# Patient Record
Sex: Female | Born: 1935
Health system: Southern US, Community
[De-identification: ages and names within clinical notes are randomized; demographics above are authoritative.]

## PROBLEM LIST (undated history)

## (undated) DIAGNOSIS — Z90710 Acquired absence of both cervix and uterus: Secondary | ICD-10-CM

## (undated) DIAGNOSIS — C50919 Malignant neoplasm of unspecified site of unspecified female breast: Secondary | ICD-10-CM

## (undated) DIAGNOSIS — Z9889 Other specified postprocedural states: Secondary | ICD-10-CM

## (undated) DIAGNOSIS — M1990 Unspecified osteoarthritis, unspecified site: Secondary | ICD-10-CM

## (undated) DIAGNOSIS — C50912 Malignant neoplasm of unspecified site of left female breast: Secondary | ICD-10-CM

## (undated) DIAGNOSIS — T7840XA Allergy, unspecified, initial encounter: Secondary | ICD-10-CM

## (undated) DIAGNOSIS — K219 Gastro-esophageal reflux disease without esophagitis: Secondary | ICD-10-CM

## (undated) HISTORY — DX: Unspecified osteoarthritis, unspecified site: M19.90

## (undated) HISTORY — DX: Gastro-esophageal reflux disease without esophagitis: K21.9

## (undated) HISTORY — PX: TOTAL SHOULDER REPLACEMENT: SUR1217

## (undated) HISTORY — PX: JOINT REPLACEMENT: SHX530

## (undated) HISTORY — DX: Malignant neoplasm of unspecified site of left female breast: C50.912

## (undated) HISTORY — PX: ABDOMINAL HYSTERECTOMY: SHX81

## (undated) HISTORY — PX: LYMPH NODE DISSECTION: SHX5087

## (undated) HISTORY — DX: Allergy, unspecified, initial encounter: T78.40XA

---

## 2003-05-06 ENCOUNTER — Other Ambulatory Visit: Payer: Self-pay

## 2004-09-15 ENCOUNTER — Ambulatory Visit: Payer: Self-pay

## 2004-10-07 ENCOUNTER — Ambulatory Visit: Payer: Self-pay | Admitting: Family Medicine

## 2004-10-13 ENCOUNTER — Ambulatory Visit: Payer: Self-pay | Admitting: Unknown Physician Specialty

## 2004-10-25 ENCOUNTER — Ambulatory Visit: Payer: Self-pay | Admitting: Unknown Physician Specialty

## 2005-10-25 ENCOUNTER — Ambulatory Visit: Payer: Self-pay | Admitting: Family Medicine

## 2006-10-31 ENCOUNTER — Ambulatory Visit: Payer: Self-pay | Admitting: Family Medicine

## 2007-02-07 ENCOUNTER — Ambulatory Visit: Payer: Self-pay | Admitting: Unknown Physician Specialty

## 2007-08-15 ENCOUNTER — Ambulatory Visit: Payer: Self-pay | Admitting: Family Medicine

## 2007-09-07 ENCOUNTER — Ambulatory Visit: Payer: Self-pay | Admitting: Unknown Physician Specialty

## 2007-11-06 ENCOUNTER — Ambulatory Visit: Payer: Self-pay | Admitting: Family Medicine

## 2009-03-02 ENCOUNTER — Ambulatory Visit: Payer: Self-pay | Admitting: Family Medicine

## 2009-07-30 ENCOUNTER — Ambulatory Visit: Payer: Self-pay | Admitting: Family Medicine

## 2011-12-20 DIAGNOSIS — R3129 Other microscopic hematuria: Secondary | ICD-10-CM | POA: Insufficient documentation

## 2012-04-18 DIAGNOSIS — Z9889 Other specified postprocedural states: Secondary | ICD-10-CM

## 2012-04-18 DIAGNOSIS — C50919 Malignant neoplasm of unspecified site of unspecified female breast: Secondary | ICD-10-CM

## 2012-04-18 HISTORY — DX: Malignant neoplasm of unspecified site of unspecified female breast: C50.919

## 2012-04-18 HISTORY — DX: Other specified postprocedural states: Z98.890

## 2012-04-18 HISTORY — PX: BREAST LUMPECTOMY: SHX2

## 2012-04-18 HISTORY — PX: BREAST SURGERY: SHX581

## 2012-04-19 ENCOUNTER — Ambulatory Visit: Payer: Self-pay | Admitting: Family Medicine

## 2012-05-19 ENCOUNTER — Ambulatory Visit: Payer: Self-pay | Admitting: Family Medicine

## 2012-09-17 ENCOUNTER — Ambulatory Visit: Payer: Self-pay | Admitting: Family Medicine

## 2012-09-18 ENCOUNTER — Ambulatory Visit: Payer: Self-pay | Admitting: Family Medicine

## 2012-10-09 ENCOUNTER — Ambulatory Visit: Payer: Self-pay | Admitting: Surgery

## 2012-10-11 ENCOUNTER — Ambulatory Visit: Payer: Self-pay | Admitting: Anesthesiology

## 2012-10-11 LAB — POTASSIUM: Potassium: 3.7 mmol/L (ref 3.5–5.1)

## 2012-11-05 ENCOUNTER — Ambulatory Visit: Payer: Self-pay | Admitting: Surgery

## 2012-11-08 LAB — PATHOLOGY REPORT

## 2012-11-13 ENCOUNTER — Ambulatory Visit: Payer: Self-pay | Admitting: Oncology

## 2012-11-15 ENCOUNTER — Ambulatory Visit: Payer: Self-pay | Admitting: Surgery

## 2012-11-15 LAB — BASIC METABOLIC PANEL
BUN: 13 mg/dL (ref 7–18)
Co2: 28 mmol/L (ref 21–32)
Creatinine: 0.79 mg/dL (ref 0.60–1.30)
EGFR (African American): >60
Osmolality: 265 (ref 275–301)
Potassium: 3.2 mmol/L — ABNORMAL LOW (ref 3.5–5.1)
Sodium: 132 mmol/L — ABNORMAL LOW (ref 136–145)

## 2012-11-15 LAB — CBC WITH DIFFERENTIAL/PLATELET
Basophil #: 0.1 10*3/uL (ref 0.0–0.1)
Basophil %: 1 %
Eosinophil #: 0.2 10*3/uL (ref 0.0–0.7)
Eosinophil %: 2.3 %
Lymphocyte #: 2.7 10*3/uL (ref 1.0–3.6)
Lymphocyte %: 28.9 %
MCHC: 35 g/dL (ref 32.0–36.0)
MCV: 87 fL (ref 80–100)
Neutrophil #: 5.6 10*3/uL (ref 1.4–6.5)
Neutrophil %: 60 %
Platelet: 266 10*3/uL (ref 150–440)
RBC: 4.45 10*6/uL (ref 3.80–5.20)
WBC: 9.3 10*3/uL (ref 3.6–11.0)

## 2012-11-16 ENCOUNTER — Ambulatory Visit: Payer: Self-pay | Admitting: Oncology

## 2012-11-20 ENCOUNTER — Ambulatory Visit: Payer: Self-pay | Admitting: Surgery

## 2012-11-22 LAB — PATHOLOGY REPORT

## 2012-12-17 ENCOUNTER — Ambulatory Visit: Payer: Self-pay | Admitting: Oncology

## 2012-12-31 LAB — CBC CANCER CENTER
Basophil #: 0.1 x10 3/mm (ref 0.0–0.1)
Eosinophil #: 0.2 x10 3/mm (ref 0.0–0.7)
HCT: 41.7 % (ref 35.0–47.0)
HGB: 14.2 g/dL (ref 12.0–16.0)
Lymphocyte #: 2.5 x10 3/mm (ref 1.0–3.6)
MCHC: 33.9 g/dL (ref 32.0–36.0)
MCV: 88 fL (ref 80–100)
Monocyte #: 0.7 x10 3/mm (ref 0.2–0.9)
Neutrophil #: 5.7 x10 3/mm (ref 1.4–6.5)
Neutrophil %: 61.9 %
Platelet: 294 x10 3/mm (ref 150–440)
RDW: 14.1 % (ref 11.5–14.5)
WBC: 9.2 x10 3/mm (ref 3.6–11.0)

## 2013-01-07 LAB — CBC CANCER CENTER
Basophil #: 0.1 x10 3/mm (ref 0.0–0.1)
Basophil %: 0.6 %
HCT: 39.9 % (ref 35.0–47.0)
HGB: 13.8 g/dL (ref 12.0–16.0)
Lymphocyte #: 1.9 x10 3/mm (ref 1.0–3.6)
Lymphocyte %: 21.4 %
MCHC: 34.5 g/dL (ref 32.0–36.0)
MCV: 88 fL (ref 80–100)
Monocyte %: 8.2 %
Neutrophil #: 6.2 x10 3/mm (ref 1.4–6.5)
WBC: 9.1 x10 3/mm (ref 3.6–11.0)

## 2013-01-14 LAB — CBC CANCER CENTER
Basophil #: 0.1 x10 3/mm (ref 0.0–0.1)
Basophil %: 0.9 %
Eosinophil #: 0.2 x10 3/mm (ref 0.0–0.7)
HCT: 39.8 % (ref 35.0–47.0)
HGB: 13.7 g/dL (ref 12.0–16.0)
Lymphocyte %: 22.8 %
MCHC: 34.5 g/dL (ref 32.0–36.0)
MCV: 88 fL (ref 80–100)
Monocyte #: 0.7 x10 3/mm (ref 0.2–0.9)
Monocyte %: 7.5 %
Neutrophil %: 66.1 %
Platelet: 252 x10 3/mm (ref 150–440)
RBC: 4.53 10*6/uL (ref 3.80–5.20)

## 2013-01-16 ENCOUNTER — Ambulatory Visit: Payer: Self-pay | Admitting: Oncology

## 2013-01-21 LAB — CBC CANCER CENTER
Basophil %: 0.9 %
HGB: 13.6 g/dL (ref 12.0–16.0)
Lymphocyte #: 1.2 x10 3/mm (ref 1.0–3.6)
MCH: 30.5 pg (ref 26.0–34.0)
MCHC: 34.1 g/dL (ref 32.0–36.0)
MCV: 90 fL (ref 80–100)
Monocyte %: 7.9 %
Neutrophil #: 4.9 x10 3/mm (ref 1.4–6.5)
RBC: 4.45 10*6/uL (ref 3.80–5.20)
RDW: 14.5 % (ref 11.5–14.5)
WBC: 7 x10 3/mm (ref 3.6–11.0)

## 2013-01-28 LAB — CBC CANCER CENTER
Basophil #: 0.1 x10 3/mm (ref 0.0–0.1)
Eosinophil #: 0.2 x10 3/mm (ref 0.0–0.7)
HGB: 13.6 g/dL (ref 12.0–16.0)
MCH: 30.9 pg (ref 26.0–34.0)
MCHC: 34.8 g/dL (ref 32.0–36.0)
Monocyte #: 0.6 x10 3/mm (ref 0.2–0.9)
Neutrophil #: 5.2 x10 3/mm (ref 1.4–6.5)
RBC: 4.39 10*6/uL (ref 3.80–5.20)
WBC: 7.4 x10 3/mm (ref 3.6–11.0)

## 2013-02-16 ENCOUNTER — Ambulatory Visit: Payer: Self-pay | Admitting: Oncology

## 2013-03-13 LAB — COMPREHENSIVE METABOLIC PANEL
Albumin: 3.4 g/dL (ref 3.4–5.0)
Bilirubin,Total: 0.3 mg/dL (ref 0.2–1.0)
Calcium, Total: 9.1 mg/dL (ref 8.5–10.1)
Chloride: 99 mmol/L (ref 98–107)
Co2: 29 mmol/L (ref 21–32)
Creatinine: 1.22 mg/dL (ref 0.60–1.30)
EGFR (Non-African Amer.): 43 — ABNORMAL LOW
Glucose: 172 mg/dL — ABNORMAL HIGH (ref 65–99)
Osmolality: 284 (ref 275–301)
SGOT(AST): 15 U/L (ref 15–37)
SGPT (ALT): 27 U/L (ref 12–78)
Sodium: 139 mmol/L (ref 136–145)
Total Protein: 6.8 g/dL (ref 6.4–8.2)

## 2013-03-13 LAB — CBC CANCER CENTER
Basophil #: 0.1 x10 3/mm (ref 0.0–0.1)
Basophil %: 1.2 %
HCT: 40.8 % (ref 35.0–47.0)
Lymphocyte #: 1.6 x10 3/mm (ref 1.0–3.6)
Lymphocyte %: 19.8 %
MCH: 29.7 pg (ref 26.0–34.0)
MCHC: 33 g/dL (ref 32.0–36.0)
Monocyte %: 5.7 %
Neutrophil #: 5.6 x10 3/mm (ref 1.4–6.5)
Neutrophil %: 71.2 %
RBC: 4.52 10*6/uL (ref 3.80–5.20)
RDW: 13.8 % (ref 11.5–14.5)
WBC: 7.9 x10 3/mm (ref 3.6–11.0)

## 2013-03-18 ENCOUNTER — Ambulatory Visit: Payer: Self-pay | Admitting: Oncology

## 2013-07-17 ENCOUNTER — Ambulatory Visit: Payer: Self-pay | Admitting: Oncology

## 2013-08-16 ENCOUNTER — Ambulatory Visit: Payer: Self-pay | Admitting: Oncology

## 2013-09-16 ENCOUNTER — Ambulatory Visit: Payer: Self-pay | Admitting: Oncology

## 2013-09-18 DIAGNOSIS — J45909 Unspecified asthma, uncomplicated: Secondary | ICD-10-CM | POA: Insufficient documentation

## 2013-09-19 ENCOUNTER — Ambulatory Visit: Payer: Self-pay | Admitting: Oncology

## 2013-10-10 ENCOUNTER — Ambulatory Visit: Payer: Self-pay | Admitting: Oncology

## 2013-10-10 LAB — CBC CANCER CENTER
Basophil #: 0.1 x10 3/mm (ref 0.0–0.1)
Basophil %: 0.9 %
EOS ABS: 0.1 x10 3/mm (ref 0.0–0.7)
EOS PCT: 1.8 %
HCT: 39.7 % (ref 35.0–47.0)
HGB: 13.7 g/dL (ref 12.0–16.0)
LYMPHS ABS: 1.3 x10 3/mm (ref 1.0–3.6)
LYMPHS PCT: 18.4 %
MCH: 30.1 pg (ref 26.0–34.0)
MCHC: 34.4 g/dL (ref 32.0–36.0)
MCV: 87 fL (ref 80–100)
MONOS PCT: 8 %
Monocyte #: 0.6 x10 3/mm (ref 0.2–0.9)
NEUTROS PCT: 70.9 %
Neutrophil #: 4.9 x10 3/mm (ref 1.4–6.5)
Platelet: 258 x10 3/mm (ref 150–440)
RBC: 4.55 10*6/uL (ref 3.80–5.20)
RDW: 14.1 % (ref 11.5–14.5)
WBC: 6.9 x10 3/mm (ref 3.6–11.0)

## 2013-10-10 LAB — COMPREHENSIVE METABOLIC PANEL
ALT: 32 U/L (ref 12–78)
Albumin: 3.9 g/dL (ref 3.4–5.0)
Alkaline Phosphatase: 72 U/L
Anion Gap: 11 (ref 7–16)
BILIRUBIN TOTAL: 0.4 mg/dL (ref 0.2–1.0)
BUN: 15 mg/dL (ref 7–18)
CHLORIDE: 100 mmol/L (ref 98–107)
Calcium, Total: 9.2 mg/dL (ref 8.5–10.1)
Co2: 28 mmol/L (ref 21–32)
Creatinine: 0.92 mg/dL (ref 0.60–1.30)
EGFR (Non-African Amer.): >60
Glucose: 98 mg/dL (ref 65–99)
Osmolality: 278 (ref 275–301)
Potassium: 3.5 mmol/L (ref 3.5–5.1)
SGOT(AST): 19 U/L (ref 15–37)
Sodium: 139 mmol/L (ref 136–145)
Total Protein: 7.1 g/dL (ref 6.4–8.2)

## 2013-10-16 ENCOUNTER — Ambulatory Visit: Payer: Self-pay | Admitting: Oncology

## 2014-02-19 ENCOUNTER — Ambulatory Visit: Payer: Self-pay | Admitting: Oncology

## 2014-03-18 ENCOUNTER — Ambulatory Visit: Payer: Self-pay | Admitting: Oncology

## 2014-04-22 DIAGNOSIS — J449 Chronic obstructive pulmonary disease, unspecified: Secondary | ICD-10-CM | POA: Diagnosis not present

## 2014-04-28 ENCOUNTER — Ambulatory Visit: Payer: Self-pay | Admitting: Oncology

## 2014-04-28 DIAGNOSIS — Z170 Estrogen receptor positive status [ER+]: Secondary | ICD-10-CM | POA: Diagnosis not present

## 2014-04-28 DIAGNOSIS — Z79811 Long term (current) use of aromatase inhibitors: Secondary | ICD-10-CM | POA: Diagnosis not present

## 2014-04-28 DIAGNOSIS — Z79899 Other long term (current) drug therapy: Secondary | ICD-10-CM | POA: Diagnosis not present

## 2014-04-28 DIAGNOSIS — I10 Essential (primary) hypertension: Secondary | ICD-10-CM | POA: Diagnosis not present

## 2014-04-28 DIAGNOSIS — E785 Hyperlipidemia, unspecified: Secondary | ICD-10-CM | POA: Diagnosis not present

## 2014-04-28 DIAGNOSIS — K219 Gastro-esophageal reflux disease without esophagitis: Secondary | ICD-10-CM | POA: Diagnosis not present

## 2014-04-28 DIAGNOSIS — J449 Chronic obstructive pulmonary disease, unspecified: Secondary | ICD-10-CM | POA: Diagnosis not present

## 2014-04-28 DIAGNOSIS — Z923 Personal history of irradiation: Secondary | ICD-10-CM | POA: Diagnosis not present

## 2014-04-28 DIAGNOSIS — C50912 Malignant neoplasm of unspecified site of left female breast: Secondary | ICD-10-CM | POA: Diagnosis not present

## 2014-04-28 LAB — COMPREHENSIVE METABOLIC PANEL
ALT: 24 U/L
AST: 14 U/L — AB (ref 15–37)
Albumin: 3.8 g/dL (ref 3.4–5.0)
Alkaline Phosphatase: 97 U/L
Anion Gap: 6 — ABNORMAL LOW (ref 7–16)
BUN: 13 mg/dL (ref 7–18)
Bilirubin,Total: 0.4 mg/dL (ref 0.2–1.0)
CHLORIDE: 102 mmol/L (ref 98–107)
Calcium, Total: 8.9 mg/dL (ref 8.5–10.1)
Co2: 31 mmol/L (ref 21–32)
Creatinine: 0.83 mg/dL (ref 0.60–1.30)
GLUCOSE: 95 mg/dL (ref 65–99)
Osmolality: 277 (ref 275–301)
Potassium: 4.1 mmol/L (ref 3.5–5.1)
SODIUM: 139 mmol/L (ref 136–145)
Total Protein: 7 g/dL (ref 6.4–8.2)

## 2014-04-28 LAB — CBC CANCER CENTER
Basophil #: 0.1 x10 3/mm (ref 0.0–0.1)
Basophil %: 1.2 %
EOS PCT: 2.1 %
Eosinophil #: 0.2 x10 3/mm (ref 0.0–0.7)
HCT: 40.8 % (ref 35.0–47.0)
HGB: 13.8 g/dL (ref 12.0–16.0)
Lymphocyte #: 1.6 x10 3/mm (ref 1.0–3.6)
Lymphocyte %: 21.2 %
MCH: 29.8 pg (ref 26.0–34.0)
MCHC: 33.8 g/dL (ref 32.0–36.0)
MCV: 88 fL (ref 80–100)
MONO ABS: 0.6 x10 3/mm (ref 0.2–0.9)
MONOS PCT: 8.3 %
NEUTROS PCT: 67.2 %
Neutrophil #: 5 x10 3/mm (ref 1.4–6.5)
PLATELETS: 264 x10 3/mm (ref 150–440)
RBC: 4.64 10*6/uL (ref 3.80–5.20)
RDW: 13.7 % (ref 11.5–14.5)
WBC: 7.5 x10 3/mm (ref 3.6–11.0)

## 2014-05-19 ENCOUNTER — Ambulatory Visit: Payer: Self-pay | Admitting: Oncology

## 2014-05-19 DIAGNOSIS — J449 Chronic obstructive pulmonary disease, unspecified: Secondary | ICD-10-CM | POA: Diagnosis not present

## 2014-05-19 DIAGNOSIS — R7309 Other abnormal glucose: Secondary | ICD-10-CM | POA: Diagnosis not present

## 2014-05-19 DIAGNOSIS — R5382 Chronic fatigue, unspecified: Secondary | ICD-10-CM | POA: Diagnosis not present

## 2014-05-19 DIAGNOSIS — E876 Hypokalemia: Secondary | ICD-10-CM | POA: Diagnosis not present

## 2014-05-19 DIAGNOSIS — I10 Essential (primary) hypertension: Secondary | ICD-10-CM | POA: Diagnosis not present

## 2014-05-23 DIAGNOSIS — J449 Chronic obstructive pulmonary disease, unspecified: Secondary | ICD-10-CM | POA: Diagnosis not present

## 2014-06-20 DIAGNOSIS — M19012 Primary osteoarthritis, left shoulder: Secondary | ICD-10-CM | POA: Diagnosis not present

## 2014-06-20 DIAGNOSIS — M19011 Primary osteoarthritis, right shoulder: Secondary | ICD-10-CM | POA: Diagnosis not present

## 2014-06-21 DIAGNOSIS — J449 Chronic obstructive pulmonary disease, unspecified: Secondary | ICD-10-CM | POA: Diagnosis not present

## 2014-07-01 ENCOUNTER — Ambulatory Visit: Payer: Self-pay

## 2014-07-01 DIAGNOSIS — M179 Osteoarthritis of knee, unspecified: Secondary | ICD-10-CM | POA: Diagnosis not present

## 2014-07-01 DIAGNOSIS — M1712 Unilateral primary osteoarthritis, left knee: Secondary | ICD-10-CM | POA: Diagnosis not present

## 2014-07-01 DIAGNOSIS — M47896 Other spondylosis, lumbar region: Secondary | ICD-10-CM | POA: Diagnosis not present

## 2014-07-01 DIAGNOSIS — M25569 Pain in unspecified knee: Secondary | ICD-10-CM | POA: Diagnosis not present

## 2014-07-01 DIAGNOSIS — F329 Major depressive disorder, single episode, unspecified: Secondary | ICD-10-CM | POA: Diagnosis not present

## 2014-07-01 DIAGNOSIS — M545 Low back pain: Secondary | ICD-10-CM | POA: Diagnosis not present

## 2014-07-01 DIAGNOSIS — M1711 Unilateral primary osteoarthritis, right knee: Secondary | ICD-10-CM | POA: Diagnosis not present

## 2014-07-01 DIAGNOSIS — G8929 Other chronic pain: Secondary | ICD-10-CM | POA: Diagnosis not present

## 2014-07-01 DIAGNOSIS — Z79899 Other long term (current) drug therapy: Secondary | ICD-10-CM | POA: Diagnosis not present

## 2014-07-08 DIAGNOSIS — J302 Other seasonal allergic rhinitis: Secondary | ICD-10-CM | POA: Diagnosis not present

## 2014-07-08 DIAGNOSIS — J449 Chronic obstructive pulmonary disease, unspecified: Secondary | ICD-10-CM | POA: Diagnosis not present

## 2014-07-08 DIAGNOSIS — M1990 Unspecified osteoarthritis, unspecified site: Secondary | ICD-10-CM | POA: Diagnosis not present

## 2014-07-08 DIAGNOSIS — K219 Gastro-esophageal reflux disease without esophagitis: Secondary | ICD-10-CM | POA: Diagnosis not present

## 2014-07-08 DIAGNOSIS — I10 Essential (primary) hypertension: Secondary | ICD-10-CM | POA: Diagnosis not present

## 2014-07-22 DIAGNOSIS — J449 Chronic obstructive pulmonary disease, unspecified: Secondary | ICD-10-CM | POA: Diagnosis not present

## 2014-07-23 DIAGNOSIS — H2513 Age-related nuclear cataract, bilateral: Secondary | ICD-10-CM | POA: Diagnosis not present

## 2014-07-25 DIAGNOSIS — J449 Chronic obstructive pulmonary disease, unspecified: Secondary | ICD-10-CM | POA: Diagnosis not present

## 2014-07-25 DIAGNOSIS — R911 Solitary pulmonary nodule: Secondary | ICD-10-CM | POA: Diagnosis not present

## 2014-08-01 ENCOUNTER — Other Ambulatory Visit: Payer: Self-pay | Admitting: Oncology

## 2014-08-01 DIAGNOSIS — Z79891 Long term (current) use of opiate analgesic: Secondary | ICD-10-CM

## 2014-08-01 DIAGNOSIS — M810 Age-related osteoporosis without current pathological fracture: Secondary | ICD-10-CM

## 2014-08-01 DIAGNOSIS — Z853 Personal history of malignant neoplasm of breast: Secondary | ICD-10-CM

## 2014-08-06 DIAGNOSIS — G8929 Other chronic pain: Secondary | ICD-10-CM | POA: Diagnosis not present

## 2014-08-06 DIAGNOSIS — M25569 Pain in unspecified knee: Secondary | ICD-10-CM | POA: Diagnosis not present

## 2014-08-06 DIAGNOSIS — Z79891 Long term (current) use of opiate analgesic: Secondary | ICD-10-CM | POA: Diagnosis not present

## 2014-08-06 DIAGNOSIS — M545 Low back pain: Secondary | ICD-10-CM | POA: Diagnosis not present

## 2014-08-06 DIAGNOSIS — F1120 Opioid dependence, uncomplicated: Secondary | ICD-10-CM | POA: Diagnosis not present

## 2014-08-06 DIAGNOSIS — M25559 Pain in unspecified hip: Secondary | ICD-10-CM | POA: Diagnosis not present

## 2014-08-08 NOTE — Op Note (Signed)
PATIENT NAME:  Candace Tapia, GAHM MR#:  [id number] DATE OF BIRTH:  November 08, 1935  DATE OF PROCEDURE:  11/20/2012  PREOPERATIVE DIAGNOSIS: Left breast carcinoma.   POSTOPERATIVE DIAGNOSIS: Left breast carcinoma.   PROCEDURE PERFORMED: Left partial mastectomy, sentinel lymph node biopsy.   SURGEON: Rodena Goldmann, MD  ASSISTANTGardiner Sleeper, [place] student; Luis Abed, [place] student.   DESCRIPTION OF PROCEDURE: With the patient in the supine position after induction of appropriate general anesthesia, the patient's left breast was prepped with ChloraPrep and draped with sterile towels. The axilla had been previously interrogated with the Neoprobe and no significant radioactivity was identified in the axilla. Reviewing preoperatively the nuclear medicine injection, there was no sentinel node identified on the scintography. The axilla was then incised and the incision carried down through the subcutaneous tissue using Bovie electrocautery. The axillary area was entered into the axillary fat pad. The Neoprobe was utilized and there were several areas of minimal radioactivity which did produce 2 large lymph nodes. They were sent for immediate evaluation and were found to contain macrometastasis in 1 of the 2 lymph nodes. Another fat pad was sent with the plan of reviewing it with permanent dissection. The area identified by the wire for the breast cancer was ellipsed and extended to the incision in the axilla in a transverse fashion. This incision was carried down through the subcutaneous tissue with Bovie electrocautery. Dissection was taken straight down to the chest wall and off the pectoralis muscle. The specimen was tagged and sent for specimen mammography which did reveal the presence of the needle and the previous clip. The specimen did appear to be intact. The area was copiously irrigated. A 10 mm flat Jackson-Pratt drain was inserted through a separate stab wound into the resection site and placed across the  pectoralis muscle. The skin was then closed using vertical mattress sutures in interrupted fashion. Compressive dressing was applied. The patient was returned to the recovery room having tolerated the procedure well. Sponge, instrument and needle counts were correct x 2 in the operating room.  ____________________________ Rodena Goldmann III, MD rle:sb D: 11/20/2012 14:32:29 ET T: 11/20/2012 15:44:19 ET JOB#: [id number]  cc: Rodena Goldmann III, MD, <Dictator> Raye Sorrow, MD Rodena Goldmann MD ELECTRONICALLY SIGNED 11/20/2012 17:00

## 2014-08-08 NOTE — Consult Note (Signed)
Reason for Visit: This 79 year old Female patient presents to the clinic for initial evaluation of  breast cancer .   Referred by Dr. Oliva Bustard.  Diagnosis:  Chief Complaint/Diagnosis   79 year old female status post wide local excision of the left breast for a stage I (T1 B. N0 M0) invasive mammary carcinoma ER positive PR borderline HER-2/neu not overexpressed  Pathology Report pathology report reviewed   Imaging Report mammograms ultrasound reviewed   Referral Report clinical notes reviewed   Planned Treatment Regimen whole breast radiation to her scar boost plus aromatase inhibitor   HPI   patient is a 79 year old femalewho presented with a BI-RADS for mammogram showing a irregular marginated structure in the deep parenchyma of the left breast at the 12:00 position with associated microcalcifications. Underwent needle localization positive for invasive mammary carcinoma.patient and I had regular routine mammograms. She underwent a wide local excisionshowing a 0.7 cm invasive mammary carcinoma with infiltrative features ER was positive PR borderline HER-2/neu not overexpressed. 2 sentinel lymph nodes were negative for metastatic disease. Tumor was overall grade 2 you lymphatic invasion was seen. Margins were clearat 1 cm for invasive disease and point for CM for ductal carcinoma in situ. Patient has done well postoperatively. She is now referred to radiation oncology for consideration of treatment.  Past Hx:    hyperlipidemia:    arthritis:    acid reflux:    Depression:    Depression:    COPD:    HTN:    back pain:    seasonal allergies:    Breast lump in outer upper quadrant:    hysterectomy (1970):    Breast Biopsy-Both (1968); Left 1968; Right 1970 (benign):    Shoulder Surgery-BL:   Past, Family and Social History:  Past Medical History positive   Cardiovascular hyperlipidemia; hypertension   Respiratory COPD; seasonal rhinitis   Gastrointestinal GERD    Neurological/Psychiatric depression   Past Surgical History shoulder surgery, hysterectomy in 1970   Past Medical History Comments arthritis, chronic back pain   Family History positive   Family History Comments sister and mother both with breast cancer patient is being evaluated for genetic testing   Social History noncontributory   Additional Past Medical and Surgical History seen by herself today   Allergies:   Penicillin: Rash  Sulfa drugs: Rash  Home Meds:  Home Medications: Medication Instructions Status  Calcium 600+D 600 mg-200 units oral tablet 1 tab(s) orally 2 times a day Active  Femara 2.5 mg oral tablet 1 tab(s) orally once a day Active  acetaminophen-HYDROcodone 325 mg-5 mg oral tablet 1 tab(s) orally every 6 hours, As Needed Active  Spiriva 18 mcg inhalation capsule 1 each inhaled once a day (in the morning) Active  hydrochlorothiazide-lisinopril 12.5 mg-20 mg oral tablet 2 tab(s) orally once a day (in the morning) Active  sertraline 100 mg oral tablet 1 tab(s) orally once a day (in the morning) Active  amLODIPine 5 mg oral tablet 1 tab(s) orally once a day (in the morning) Active  omeprazole 20 mg oral delayed release capsule 1 cap(s) orally once a day (in the evening) Active  Vitamin B12 2500 mcg sublingual tablet 1 tab(s) sublingual once a day (in the morning) Active  Vitamin D3 1000 intl units oral tablet 1 tab(s) orally once a day (in the morning) Active   Review of Systems:  General negative   Performance Status (ECOG) 0   Skin negative   Breast see HPI   Ophthalmologic negative  ENMT negative   Respiratory and Thorax negative   Cardiovascular negative   Gastrointestinal negative   Genitourinary negative   Musculoskeletal negative   Neurological negative   Psychiatric negative   Hematology/Lymphatics negative   Endocrine negative   Allergic/Immunologic negative   Nursing Notes:  Nursing Vital Signs and Chemo Nursing Nursing  Notes: *CC Vital Signs Flowsheet:   21-Aug-14 13:44  Temp Temperature 97.7  Pulse Pulse 96  SBP SBP 166  DBP DBP 90  Pain Scale (0-10)  0  Current Weight (kg) (kg) 94.9   Physical Exam:  General/Skin/HEENT:  General normal   Skin normal   Eyes normal   ENMT normal   Head and Neck normal   Additional PE well-developed slightly obese female in NAD. She status post wide local excision of the left breast with a single incision horizontal including her axillary site. Single incision extends into the left axilla. Incision is healing well. No dominant mass or nodularity is noted in either breast into position examined. Lungs are clear to A&P cardiac examination shows regular rate and rhythm. Abdomen is benign. No axillary or supraclavicular adenopathy is appreciated.   Breasts/Resp/CV/GI/GU:  Respiratory and Thorax normal   Cardiovascular normal   Gastrointestinal normal   Genitourinary normal   MS/Neuro/Psych/Lymph:  Musculoskeletal normal   Neurological normal   Lymphatics normal   Other Results:  Radiology Results: [place]:    03-Jun-14 15:40, US Breast Left  US Breast Left   REASON FOR EXAM:    av lt nodularity  COMMENTS:       PROCEDURE: [place]  - US BREAST LEFT  - Sep 18 2012  3:40PM     RESULT: Ultrasound directed to the upper aspect of the left breast at   approximately 12:00 position was performed. There is a hypoechoicto   anechoic structure demonstrated approximately 2 cm from the nipple it   measures 4 x 2 x 3 millimeters. Distal shadowing is not demonstrated.    IMPRESSION:  There is a hypoechoic to anechoic structure in the deep   parenchyma the 12:30 positionof the left breast. This is most compatible   with a cyst.     Please see the dictation of the diagnostic mammogram of today's date for     final recommendations and BI-RADS classification.        Dictation site:1        Verified By: Maryella Shivers., MD  LabUnknown:    03-Jun-14 14:17,  Digital Additional Views Lt Breast [facility])  PACS Image     03-Jun-14 15:40, US Breast Left  PACS [place]:    03-Jun-14 14:17, Digital Additional Views Lt Breast (SCR)  Digital Additional Views Lt Breast (SCR)   REASON FOR EXAM:    av lt nodularity  COMMENTS:       PROCEDURE: MAM - MAM DGTL ADD VW LT  SCR  - Sep 18 2012  2:17PM     RESULT: On the patient's screening study of September 17, 2012 nodularity was   noted be more conspicuous on the left. The patient wasasked to return   for spot compression and true lateral films today.    On today's study there remains subtle increased density in the midportion   of the left breast with 8 microcalcifications. Ultrasound performed today   did not reveal a discrete corresponding lesion. More superficial cystic   structure was demonstrated.    IMPRESSION:  There is a persistent abnormal appearing irregularly  marginated structure in the deep parenchyma of the left breast above the   equator at approximately the 12:00 position. Very faint   microcalcifications are demonstrated within it. This is demonstrated to   best advantage on the cc and true lateral views.    BI-RADS 4a: There is spiculated nodular density in the deep parenchyma   just above the equator of the left breast which contains very faint   microcalcifications. Surgical consultation is recommended. This could be   considered for wire localization and open biopsy or stereotactic biopsy.   A lesion here was not identified at ultrasound.        A NEGATIVE MAMMOGRAM REPORT DOES NOT PRECLUDE BIOPSY OR OTHER EVALUATION   OF A CLINICALLY PALPABLE OR OTHERWISE SUSPICIOUS MASS OR LESION. BREAST     CANCER MAY NOT BE DETECTED BY MAMMOGRAPHY IN UP TO 10% OF CASES.     Thank you for the opportunity to contribute to the care of your patient.      Dictation site:1        Verified By: DAVID A. Martinique, M.D., MD   Relevent Results:   Relevant Scans and Labs mammograms  and ultrasound are reviewed   Assessment and Plan: Impression:   stage I invasive mammary carcinoma of the left breast status post wide local excision and sentinel node biopsy ER positive PR status borderline HER-2/neu not overexpressed in 79 year old female Plan:   patient has been seen by medical oncology and I have discussed the case personally with Dr. Oliva Bustard. Patient will be a candidate for aromatase inhibitor after radiation. I have recommended going ahead with whole breast radiation at 5000 cGy boosting or scar another 1400 cGy using electron beam. Risks and benefits of treatment including irritation of the skin, fatigue, alteration of blood counts, and possible inclusion of slight amount of superficial lung were all explained in detail to the patient. She seems to comprehend my treatment plan well. I have set her up for CT simulation early next week to allow some further healing since his only been about 2 weeks since her surgery.  I would like to take this opportunity to thank you for allowing me to continue to participate in this patient's care.  CC Referral:  cc: Dr. Ellene Route, Dr. Pat Patrick   Electronic Signatures: Baruch Gouty, Roda Shutters (MD)  (Signed 21-Aug-14 15:13)  Authored: HPI, Diagnosis, Past Hx, PFSH, Allergies, Home Meds, ROS, Nursing Notes, Physical Exam, Other Results, Relevent Results, Encounter Assessment and Plan, CC Referring Physician   Last Updated: 21-Aug-14 15:13 by Armstead Peaks (MD)

## 2014-08-29 DIAGNOSIS — Z008 Encounter for other general examination: Secondary | ICD-10-CM | POA: Diagnosis not present

## 2014-08-29 DIAGNOSIS — R05 Cough: Secondary | ICD-10-CM | POA: Diagnosis not present

## 2014-08-29 DIAGNOSIS — Z23 Encounter for immunization: Secondary | ICD-10-CM | POA: Diagnosis not present

## 2014-09-01 ENCOUNTER — Other Ambulatory Visit: Payer: Self-pay | Admitting: Specialist

## 2014-09-01 DIAGNOSIS — R918 Other nonspecific abnormal finding of lung field: Secondary | ICD-10-CM

## 2014-09-08 ENCOUNTER — Ambulatory Visit
Admission: RE | Admit: 2014-09-08 | Discharge: 2014-09-08 | Disposition: A | Discharge status: Home / Self Care | Payer: Medicare Other | Source: Ambulatory Visit | Attending: Specialist | Admitting: Specialist

## 2014-09-08 DIAGNOSIS — I700 Atherosclerosis of aorta: Secondary | ICD-10-CM | POA: Insufficient documentation

## 2014-09-08 DIAGNOSIS — J439 Emphysema, unspecified: Secondary | ICD-10-CM | POA: Diagnosis not present

## 2014-09-08 DIAGNOSIS — R918 Other nonspecific abnormal finding of lung field: Secondary | ICD-10-CM | POA: Diagnosis not present

## 2014-09-08 DIAGNOSIS — Z853 Personal history of malignant neoplasm of breast: Secondary | ICD-10-CM | POA: Insufficient documentation

## 2014-09-09 DIAGNOSIS — M25559 Pain in unspecified hip: Secondary | ICD-10-CM | POA: Diagnosis not present

## 2014-09-09 DIAGNOSIS — M545 Low back pain: Secondary | ICD-10-CM | POA: Diagnosis not present

## 2014-09-09 DIAGNOSIS — M25569 Pain in unspecified knee: Secondary | ICD-10-CM | POA: Diagnosis not present

## 2014-09-09 DIAGNOSIS — G8929 Other chronic pain: Secondary | ICD-10-CM | POA: Diagnosis not present

## 2014-09-21 DIAGNOSIS — J449 Chronic obstructive pulmonary disease, unspecified: Secondary | ICD-10-CM | POA: Diagnosis not present

## 2014-09-22 ENCOUNTER — Ambulatory Visit: Payer: Self-pay

## 2014-09-22 ENCOUNTER — Ambulatory Visit: Admission: RE | Admit: 2014-09-22 | Payer: Medicare Other | Source: Ambulatory Visit

## 2014-09-22 ENCOUNTER — Ambulatory Visit
Admission: RE | Admit: 2014-09-22 | Discharge: 2014-09-22 | Disposition: A | Discharge status: Home / Self Care | Payer: Medicare Other | Source: Ambulatory Visit | Attending: Oncology | Admitting: Oncology

## 2014-09-22 DIAGNOSIS — R928 Other abnormal and inconclusive findings on diagnostic imaging of breast: Secondary | ICD-10-CM | POA: Diagnosis not present

## 2014-09-22 DIAGNOSIS — Z853 Personal history of malignant neoplasm of breast: Secondary | ICD-10-CM | POA: Diagnosis not present

## 2014-09-22 HISTORY — DX: Malignant neoplasm of unspecified site of unspecified female breast: C50.919

## 2014-09-30 ENCOUNTER — Ambulatory Visit
Admission: RE | Admit: 2014-09-30 | Discharge: 2014-09-30 | Disposition: A | Discharge status: Home / Self Care | Payer: Medicare Other | Source: Ambulatory Visit | Attending: Oncology | Admitting: Oncology

## 2014-09-30 DIAGNOSIS — Z79891 Long term (current) use of opiate analgesic: Secondary | ICD-10-CM | POA: Insufficient documentation

## 2014-09-30 DIAGNOSIS — Z780 Asymptomatic menopausal state: Secondary | ICD-10-CM | POA: Diagnosis not present

## 2014-09-30 DIAGNOSIS — M810 Age-related osteoporosis without current pathological fracture: Secondary | ICD-10-CM | POA: Insufficient documentation

## 2014-09-30 DIAGNOSIS — Z853 Personal history of malignant neoplasm of breast: Secondary | ICD-10-CM | POA: Insufficient documentation

## 2014-09-30 DIAGNOSIS — M8580 Other specified disorders of bone density and structure, unspecified site: Secondary | ICD-10-CM | POA: Insufficient documentation

## 2014-09-30 DIAGNOSIS — M8588 Other specified disorders of bone density and structure, other site: Secondary | ICD-10-CM | POA: Diagnosis not present

## 2014-10-21 DIAGNOSIS — J449 Chronic obstructive pulmonary disease, unspecified: Secondary | ICD-10-CM | POA: Diagnosis not present

## 2014-10-24 ENCOUNTER — Other Ambulatory Visit: Payer: Self-pay | Admitting: *Deleted

## 2014-10-24 DIAGNOSIS — C50919 Malignant neoplasm of unspecified site of unspecified female breast: Secondary | ICD-10-CM

## 2014-10-27 ENCOUNTER — Inpatient Hospital Stay: Payer: Medicare Other | Attending: Oncology | Admitting: Oncology

## 2014-10-27 ENCOUNTER — Inpatient Hospital Stay: Payer: Medicare Other

## 2014-10-27 ENCOUNTER — Encounter: Payer: Self-pay | Admitting: Oncology

## 2014-10-27 VITALS — BP 182/109 | HR 84 | Temp 96.6°F | Wt 217.6 lb

## 2014-10-27 DIAGNOSIS — C50919 Malignant neoplasm of unspecified site of unspecified female breast: Secondary | ICD-10-CM

## 2014-10-27 DIAGNOSIS — Z170 Estrogen receptor positive status [ER+]: Secondary | ICD-10-CM | POA: Insufficient documentation

## 2014-10-27 DIAGNOSIS — C50912 Malignant neoplasm of unspecified site of left female breast: Secondary | ICD-10-CM | POA: Diagnosis not present

## 2014-10-27 DIAGNOSIS — Z79811 Long term (current) use of aromatase inhibitors: Secondary | ICD-10-CM | POA: Diagnosis not present

## 2014-10-27 DIAGNOSIS — M8580 Other specified disorders of bone density and structure, unspecified site: Secondary | ICD-10-CM

## 2014-10-27 HISTORY — DX: Malignant neoplasm of unspecified site of left female breast: C50.912

## 2014-10-27 LAB — COMPREHENSIVE METABOLIC PANEL
ALBUMIN: 4.3 g/dL (ref 3.5–5.0)
ALT: 21 U/L (ref 14–54)
ANION GAP: 11 (ref 5–15)
AST: 21 U/L (ref 15–41)
Alkaline Phosphatase: 103 U/L (ref 38–126)
BILIRUBIN TOTAL: 0.6 mg/dL (ref 0.3–1.2)
BUN: 16 mg/dL (ref 6–20)
CO2: 26 mmol/L (ref 22–32)
Calcium: 9 mg/dL (ref 8.9–10.3)
Chloride: 101 mmol/L (ref 101–111)
Creatinine, Ser: 0.84 mg/dL (ref 0.44–1.00)
GFR calc Af Amer: >60 mL/min (ref >60)
GFR calc non Af Amer: >60 mL/min (ref >60)
GLUCOSE: 91 mg/dL (ref 65–99)
Potassium: 3.6 mmol/L (ref 3.5–5.1)
Sodium: 138 mmol/L (ref 135–145)
TOTAL PROTEIN: 7.6 g/dL (ref 6.5–8.1)

## 2014-10-27 LAB — CBC WITH DIFFERENTIAL/PLATELET
BASOS ABS: 0.1 10*3/uL (ref 0–0.1)
Basophils Relative: 1 %
Eosinophils Absolute: 0.1 10*3/uL (ref 0–0.7)
Eosinophils Relative: 2 %
HEMATOCRIT: 43 % (ref 35.0–47.0)
HEMOGLOBIN: 14.3 g/dL (ref 12.0–16.0)
LYMPHS ABS: 1.5 10*3/uL (ref 1.0–3.6)
Lymphocytes Relative: 19 %
MCH: 29.3 pg (ref 26.0–34.0)
MCHC: 33.3 g/dL (ref 32.0–36.0)
MCV: 88 fL (ref 80.0–100.0)
Monocytes Absolute: 0.6 10*3/uL (ref 0.2–0.9)
Monocytes Relative: 7 %
Neutro Abs: 5.8 10*3/uL (ref 1.4–6.5)
Neutrophils Relative %: 71 %
Platelets: 242 10*3/uL (ref 150–440)
RBC: 4.89 MIL/uL (ref 3.80–5.20)
RDW: 14.7 % — ABNORMAL HIGH (ref 11.5–14.5)
WBC: 8.1 10*3/uL (ref 3.6–11.0)

## 2014-10-27 NOTE — Progress Notes (Signed)
Patient does not have living will.  Declined information.  Former smoker. 

## 2014-10-27 NOTE — Progress Notes (Signed)
[place] @ [employer] [phone]  Fax:([phone]) [place]: April 18, 1936  MR#: [id number]  LKJ#:[id number]  Patient Care Team: Candace Tapia., MD as PCP - General (Family Medicine)  CHIEF COMPLAINT:  Chief Complaint  Patient presents with  . Follow-up    Oncology History   79 year old female status post wide local excision of the left breast for a stage I (T1 B. N0 M0) invasive mammary carcinoma ER positive PR borderline HER-2/neu not overexpressed She has  finished radiation therapy on September of 2014 She has declined any genetic study Started on Femara  in November of 2014 Candace Tapia was Discontinued because of side effect and may 2015. Started on Aromasin in May of  2015 HPI:        Cancer of left breast   10/27/2014 Initial Diagnosis Cancer of left breast     INTERVAL HISTORY:  79 year old lady with a history of carcinoma of breast (left) status post lumpectomy radiation therapy on Aromasin and tolerating treatment very well. Grade 1 and joint pains.  No nausea.  No vomiting.  No diarrhea. Asked mammogram was in June reported to be pyretic 2 REVIEW OF SYSTEMS:   GENERAL:  Feels good.  Active.  No fevers, sweats or weight loss. PERFORMANCE STATUS (ECOG):  0 HEENT:  No visual changes, runny nose, sore throat, mouth sores or tenderness. Lungs: No shortness of breath or cough.  No hemoptysis. Cardiac:  No chest pain, palpitations, orthopnea, or PND. GI:  No nausea, vomiting, diarrhea, constipation, melena or hematochezia. GU:  No urgency, frequency, dysuria, or hematuria. Musculoskeletal:  No back pain.  No joint pain.  No muscle tenderness. Extremities:  No pain or swelling. Skin:  No rashes or skin changes. Neuro:  No headache, numbness or weakness, balance or coordination issues. Endocrine:  No diabetes, thyroid issues, hot flashes or night sweats. Psych:  No mood changes, depression or anxiety. Pain:  No focal pain. Review of  systems:  All other systems reviewed and found to be negative. As per HPI. Otherwise, a complete review of systems is negatve.  PAST MEDICAL HISTORY: Past Medical History  Diagnosis Date  . Breast cancer 2014    radiation and taking exemestane  . Cancer of left breast 10/27/2014    PAST SURGICAL HISTORY: Past Surgical History  Procedure Laterality Date  . Breast biopsy Bilateral 1966    neg  . Breast biopsy Left 2014    positive    FAMILY HISTORY Family History  Problem Relation Age of Onset  . Breast cancer Mother 22  . Breast cancer Sister 30    ADVANCED DIRECTIVES:  Patient does not have any living will or healthcare power of attorney.  Patient declined to get any information .  Available resources had been discussed.  We will follow-up on subsequent appointments regarding this issue HEALTH MAINTENANCE: History  Substance Use Topics  . Smoking status: Not on file  . Smokeless tobacco: Not on file  . Alcohol Use: Not on file      Allergies  Allergen Reactions  . Penicillins Other (See Comments)  . Penicillin G Rash  . Sulfa Antibiotics Other (See Comments) and Rash    No current outpatient prescriptions on file.   No current facility-administered medications for this visit.    OBJECTIVE:  Filed Vitals:   10/27/14 1119  BP: 182/109  Pulse: 84  Temp: 96.6 F (35.9 C)     There is no height on file  to calculate BMI.    ECOG FS:0 - Asymptomatic  PHYSICAL EXAM: GENERAL:  Well developed, well nourished, sitting comfortably in the exam room in no acute distress. MENTAL STATUS:  Alert and oriented to person, place and time. HEAD:  Normocephalic, atraumatic, face symmetric, no Cushingoid features. EYES: Pupils equal round and reactive to light and accomodation.  No conjunctivitis or scleral icterus. ENT:  Oropharynx clear without lesion.  Tongue normal. Mucous membranes moist.  RESPIRATORY:  Clear to auscultation without rales, wheezes or  rhonchi. CARDIOVASCULAR:  Regular rate and rhythm without murmur, rub or gallop. BREAST:  Right breast without masses, skin changes or nipple discharge.  Left breast: Lumpectomy.  Scar tissue.  No palpable masses.  Radiation changes on the skin.  Exam rate nodes not palpable. ABDOMEN:  Soft, non-tender, with active bowel sounds, and no hepatosplenomegaly.  No masses. BACK:  No CVA tenderness.  No tenderness on percussion of the back or rib cage. SKIN:  No rashes, ulcers or lesions. EXTREMITIES: No edema, no skin discoloration or tenderness.  No palpable cords. LYMPH NODES: No palpable cervical, supraclavicular, axillary or inguinal adenopathy  NEUROLOGICAL: Unremarkable. PSYCH:  Appropriate.   LAB RESULTS:  Appointment on 10/27/2014  Component Date Value Ref Range Status  . WBC 10/27/2014 8.1  3.6 - 11.0 K/uL Final  . RBC 10/27/2014 4.89  3.80 - 5.20 MIL/uL Final  . Hemoglobin 10/27/2014 14.3  12.0 - 16.0 g/dL Final  . HCT 10/27/2014 43.0  35.0 - 47.0 % Final  . MCV 10/27/2014 88.0  80.0 - 100.0 fL Final  . MCH 10/27/2014 29.3  26.0 - 34.0 pg Final  . MCHC 10/27/2014 33.3  32.0 - 36.0 g/dL Final  . RDW 10/27/2014 14.7* 11.5 - 14.5 % Final  . Platelets 10/27/2014 242  150 - 440 K/uL Final  . Neutrophils Relative % 10/27/2014 71   Final  . Neutro Abs 10/27/2014 5.8  1.4 - 6.5 K/uL Final  . Lymphocytes Relative 10/27/2014 19   Final  . Lymphs Abs 10/27/2014 1.5  1.0 - 3.6 K/uL Final  . Monocytes Relative 10/27/2014 7   Final  . Monocytes Absolute 10/27/2014 0.6  0.2 - 0.9 K/uL Final  . Eosinophils Relative 10/27/2014 2   Final  . Eosinophils Absolute 10/27/2014 0.1  0 - 0.7 K/uL Final  . Basophils Relative 10/27/2014 1   Final  . Basophils Absolute 10/27/2014 0.1  0 - 0.1 K/uL Final  . Sodium 10/27/2014 138  135 - 145 mmol/L Final  . Potassium 10/27/2014 3.6  3.5 - 5.1 mmol/L Final  . Chloride 10/27/2014 101  101 - 111 mmol/L Final  . CO2 10/27/2014 26  22 - 32 mmol/L Final  .  Glucose, Bld 10/27/2014 91  65 - 99 mg/dL Final  . BUN 10/27/2014 16  6 - 20 mg/dL Final  . Creatinine, Ser 10/27/2014 0.84  0.44 - 1.00 mg/dL Final  . Calcium 10/27/2014 9.0  8.9 - 10.3 mg/dL Final  . Total Protein 10/27/2014 7.6  6.5 - 8.1 g/dL Final  . Albumin 10/27/2014 4.3  3.5 - 5.0 g/dL Final  . AST 10/27/2014 21  15 - 41 U/L Final  . ALT 10/27/2014 21  14 - 54 U/L Final  . Alkaline Phosphatase 10/27/2014 103  38 - 126 U/L Final  . Total Bilirubin 10/27/2014 0.6  0.3 - 1.2 mg/dL Final  . GFR calc non Af Amer 10/27/2014 >60  >60 mL/min Final  . GFR calc Af Amer 10/27/2014 >60  >  60 mL/min Final   Comment: (NOTE) The eGFR has been calculated using the CKD EPI equation. This calculation has not been validated in all clinical situations. eGFR's persistently <60 mL/min signify possible Chronic Kidney Disease.   . Anion gap 10/27/2014 11  5 - 15 Final      STUDIES: Dg Bone Density  09/30/2014   EXAM: DUAL X-RAY ABSORPTIOMETRY (DXA) FOR BONE MINERAL DENSITY  IMPRESSION: Dear Dr. Jeb Levering, Your patient Candace Tapia completed a BMD test on 09/30/2014 using the [place] (analysis version: 14.10) manufactured by [employer]. The following summarizes the results of our evaluation.  PATIENT BIOGRAPHICAL: Name: Candace Tapia, Candace Tapia Patient ID: [id number] Birth Date: 05/07/1935 Height: 66.0 in. Gender: Female Exam Date: 09/30/2014 Weight: 215.1 lbs. Indications: Advanced Age, asthma, Caucasian, COPD, Family Hx of Osteoporosis, Height Loss, High Risk Meds, History of Breast Cancer, Hysterectomy, Oopherectomy Bilateral, Postmenopausal, Vit D Defic Fractures: Treatments: AROMASIN, nexium, spiriva, Vitamin D  ASSESSMENT: The BMD measured at AP Spine L1-L4 is 0.953 g/cm2 with a T-score of -2.0. This patient is considered osteopenic according to [facility]) criteria.  Site Region Measured Measured WHO Young Adult BMD Date       Age      Classification T-score AP Spine L1-L4  11/30/[age].1 Oste[age] -2.0 0.953 g/cm2 AP Spine L1-L4 11/06/2007 70.2 Osteopenia -1.6 0.995 g/cm2 AP Spine L1-L4 10/31/2006 69.2 Osteopenia -1.5 1.015 g/cm2  DualFemur Neck Left 09/30/2014 77.1 Normal -0.9 0.917 g/cm2 DualFemur Neck Left 11/06/2007 70.2 Normal -0.7 0.944 g/cm2 DualFemur Neck Left 10/31/2006 69.2 Normal -0.6 0.948 g/cm2  World Health Organization [facility]) criteria for post-menopausal, Caucasian Women: Normal:       T-score at or above -1 SD Osteopenia:   T-score between -1 and -2.5 SD Osteoporosis: T-score at or below -2.5 SD  RECOMMENDATIONS: [place] recommends that FDA-approved medical therapies be considered in postmenopausal women and men age 11 or older with a: 1. Hip or vertebral (clinical or morphometric) fracture. 2. T-score of < -2.5 at the spine or hip. 3. Ten-year fracture probability by FRAX of 3% or greater for hip fracture or 20% or greater for major osteoporotic fracture.  All treatment decisions require clinical judgment and consideration of individual patient factors, including patient preferences, co-morbidities, previous drug use, risk factors not captured in the FRAX model (e.g. falls, vitamin D deficiency, increased bone turnover, interval significant decline in bone density) and possible under - or over-estimation of fracture risk by FRAX.  All patients should ensure an adequate intake of dietary calcium (1200 mg/d) and vitamin D (800 IU daily) unless contraindicated.  FOLLOW-UP: People with diagnosed cases of osteoporosis or at high risk for fracture should have regular bone mineral density tests. For patients eligible for Medicare, routine testing is allowed once every 2 years. The testing frequency can be increased to one year for patients who have rapidly progressing disease, those who are receiving or discontinuing medical therapy to restore bone mass, or have additional risk factors.  Your patient Candace Tapia completed a FRAX assessment on  09/30/2014 using the [place] (analysis version: 14.10) manufactured by [employer]. The following summarizes the results of our evaluation.  PATIENT BIOGRAPHICAL: Name: Candace Tapia, Candace Tapia Patient ID: [id number] Birth Date: October 11, 1935 Height:    66.0 in. Gender:     Female    [age]       Weight:    215.1 lbs. Ethnicity:  White  Exam Date: 09/30/2014  FRAX* RESULTS:  (version: 3.5) 10-year Probability of Fracture1 Major Osteoporotic Fracture2 Hip Fracture 9.5% 1.4% Population: [place] (Caucasian) Risk Factors: None  Based on Femur (Left) Neck BMD  1 -The 10-year probability of fracture may be lower than reported if the patient has received treatment. 2 -Major Osteoporotic Fracture: Clinical Spine, Forearm, Hip or Shoulder  *FRAX is a Materials engineer of the [employer] for Metabolic Bone Disease, a [place] (WHO) [facility].  ASSESSMENT: The probability of a major osteoporotic fracture is 9.5% within the next ten years.  The probability of a hip fracture is 1.4% within the next ten years.   Electronically Signed   By: Earle Gell M.D.   On: 09/30/2014 11:14    ASSESSMENT: Carcinoma of left breast stage I disease ER positive on Aromasin Vitamin D deficiency being managed by primary care physician Bone density dated June of 2016 osteopenia will be followed if needed further treatment would be added Mammogram in June reported to be negative  MEDICAL DECISION MAKING:  All lab data x-ray data and bone density study has been reviewed. Continue Aromasin.  Patient was advised to continue calcium and vitamin D and exercise bone density will be repeated in June of 2017 any of there is any further deterioration of bone possibility of Zometa or Fosamax would be considered. Patient does not have any history of bone fracture Taking high-dose of vitamin D and calcium  Patient expressed understanding and was in  agreement with this plan. She also understands that She can call clinic at any time with any questions, concerns, or complaints.    No matching staging information was found for the patient.  Forest Gleason, MD   10/27/2014 11:32 AM

## 2014-11-17 ENCOUNTER — Other Ambulatory Visit: Payer: Self-pay | Admitting: Family Medicine

## 2014-11-17 MED ORDER — TRIAMCINOLONE ACETONIDE 0.1 % EX CREA: 1.0000 "application " | TOPICAL_CREAM | Freq: Two times a day (BID) | CUTANEOUS | Status: AC

## 2014-11-28 ENCOUNTER — Ambulatory Visit (INDEPENDENT_AMBULATORY_CARE_PROVIDER_SITE_OTHER): Payer: Medicare Other | Admitting: Family Medicine

## 2014-11-28 ENCOUNTER — Encounter: Payer: Self-pay | Admitting: Family Medicine

## 2014-11-28 VITALS — BP 121/78 | HR 71 | Temp 97.9°F | Resp 14 | Ht 65.0 in | Wt 217.6 lb

## 2014-11-28 DIAGNOSIS — G8929 Other chronic pain: Secondary | ICD-10-CM | POA: Diagnosis not present

## 2014-11-28 DIAGNOSIS — E559 Vitamin D deficiency, unspecified: Secondary | ICD-10-CM

## 2014-11-28 DIAGNOSIS — I10 Essential (primary) hypertension: Secondary | ICD-10-CM | POA: Diagnosis not present

## 2014-11-28 DIAGNOSIS — J449 Chronic obstructive pulmonary disease, unspecified: Secondary | ICD-10-CM

## 2014-11-28 DIAGNOSIS — M1990 Unspecified osteoarthritis, unspecified site: Secondary | ICD-10-CM | POA: Diagnosis not present

## 2014-11-28 DIAGNOSIS — I129 Hypertensive chronic kidney disease with stage 1 through stage 4 chronic kidney disease, or unspecified chronic kidney disease: Secondary | ICD-10-CM | POA: Insufficient documentation

## 2014-11-28 MED ORDER — MELOXICAM 15 MG PO TABS: 15.0000 mg | ORAL_TABLET | Freq: Every day | ORAL | Status: DC

## 2014-11-28 NOTE — Progress Notes (Signed)
Name: Candace Tapia   MRN: [id number]    DOB: 12/02/1935   Date:11/28/2014       Progress Note  Subjective  Chief Complaint  Chief Complaint  Patient presents with  . Hypertension    HPI  Here for f/u of HBP.  Has COPD (sees Dr. Raul Del), and chronic pain mainly sec. To arthritis. Wants to start back [facility] pain clinic. No problem-specific assessment & plan notes found for this encounter.   Past Medical History  Diagnosis Date  . Breast cancer 2014    radiation and taking exemestane  . Cancer of left breast 10/27/2014    Social History  Substance Use Topics  . Smoking status: Former Research scientist (life sciences)  . Smokeless tobacco: Never Used  . Alcohol Use: No     Current outpatient prescriptions:  .  acetaminophen (TYLENOL) 500 MG tablet, Take 500 mg by mouth every 6 (six) hours as needed., Disp: , Rfl:  .  albuterol (PROAIR HFA) 108 (90 BASE) MCG/ACT inhaler, Inhale into the lungs., Disp: , Rfl:  .  amLODipine (NORVASC) 5 MG tablet, , Disp: , Rfl:  .  Ascorbic Acid (VITAMIN C) 100 MG tablet, Take 100 mg by mouth daily., Disp: , Rfl:  .  calcium gluconate 650 MG tablet, Take 650 mg by mouth daily., Disp: , Rfl:  .  exemestane (AROMASIN) 25 MG tablet, , Disp: , Rfl:  .  fexofenadine (ALLEGRA) 180 MG tablet, Take 180 mg by mouth daily., Disp: , Rfl:  .  fluticasone (FLONASE) 50 MCG/ACT nasal spray, Place into both nostrils daily., Disp: , Rfl:  .  NEXIUM 20 MG capsule, , Disp: , Rfl:  .  SPIRIVA HANDIHALER 18 MCG inhalation capsule, , Disp: , Rfl:  .  triamcinolone cream (KENALOG) 0.1 %, Apply 1 application topically 2 (two) times daily., Disp: 30 g, Rfl: 1 .  Vitamin D, Ergocalciferol, (DRISDOL) 50000 UNITS CAPS capsule, Take 50,000 Units by mouth every 7 (seven) days., Disp: , Rfl:   Allergies  Allergen Reactions  . Penicillins Other (See Comments)  . Penicillin G Rash  . Sulfa Antibiotics Other (See Comments) and Rash    Review of Systems  Constitutional: Positive for  malaise/fatigue. Negative for fever, chills and weight loss.  HENT: Negative for hearing loss.   Eyes: Negative for blurred vision and double vision.  Respiratory: Positive for cough, sputum production, shortness of breath and wheezing.   Cardiovascular: Negative for chest pain, palpitations, orthopnea and leg swelling.  Gastrointestinal: Negative for heartburn, nausea, vomiting, abdominal pain, diarrhea and blood in stool.  Genitourinary: Negative for dysuria, urgency and frequency.  Musculoskeletal: Positive for back pain and joint pain.  Skin: Negative for rash.  Neurological: Positive for weakness. Negative for dizziness, tingling, tremors and headaches.      Objective  Filed Vitals:   11/28/14 1429  BP: 121/78  Pulse: 71  Temp: 97.9 F (36.6 C)  Resp: 14  Height: _0  (1.651 m)  Weight: 217 lb 9.6 oz (98.703 kg)     Physical Exam  Constitutional: She is oriented to person, place, and time and well-developed, well-nourished, and in no distress. No distress.  HENT:  Head: Normocephalic and atraumatic.  Eyes: Conjunctivae and EOM are normal. Pupils are equal, round, and reactive to light.  Neck: Normal range of motion. Neck supple. Carotid bruit is not present. No thyromegaly present.  Cardiovascular: Normal rate, regular rhythm, normal heart sounds and intact distal pulses.  Exam reveals no gallop and no friction rub.  No murmur heard. Pulmonary/Chest: Effort normal and breath sounds normal. No respiratory distress. She has no wheezes. She has no rales.  Abdominal: Soft. Bowel sounds are normal. She exhibits no distension and no mass. There is no tenderness.  Musculoskeletal: She exhibits edema (non-pitting edema of bilateral feet.).  Pain and stiffness of almost alljints of bilat. Hands and bilat. Knees, and low back.  Lymphadenopathy:    She has no cervical adenopathy.  Neurological: She is alert and oriented to person, place, and time.  Vitals  reviewed.     Recent Results (from the past 2160 hour(s))  CBC with Differential     Status: Abnormal   Collection Time: 10/27/14 10:52 AM  Result Value Ref Range   WBC 8.1 3.6 - 11.0 K/uL   RBC 4.89 3.80 - 5.20 MIL/uL   Hemoglobin 14.3 12.0 - 16.0 g/dL   HCT 43.0 35.0 - 47.0 %   MCV 88.0 80.0 - 100.0 fL   MCH 29.3 26.0 - 34.0 pg   MCHC 33.3 32.0 - 36.0 g/dL   RDW 14.7 (H) 11.5 - 14.5 %   Platelets 242 150 - 440 K/uL   Neutrophils Relative % 71 %   Neutro Abs 5.8 1.4 - 6.5 K/uL   Lymphocytes Relative 19 %   Lymphs Abs 1.5 1.0 - 3.6 K/uL   Monocytes Relative 7 %   Monocytes Absolute 0.6 0.2 - 0.9 K/uL   Eosinophils Relative 2 %   Eosinophils Absolute 0.1 0 - 0.7 K/uL   Basophils Relative 1 %   Basophils Absolute 0.1 0 - 0.1 K/uL  Comprehensive metabolic panel     Status: None   Collection Time: 10/27/14 10:52 AM  Result Value Ref Range   Sodium 138 135 - 145 mmol/L   Potassium 3.6 3.5 - 5.1 mmol/L   Chloride 101 101 - 111 mmol/L   CO2 26 22 - 32 mmol/L   Glucose, Bld 91 65 - 99 mg/dL   BUN 16 6 - 20 mg/dL   Creatinine, Ser 0.84 0.44 - 1.00 mg/dL   Calcium 9.0 8.9 - 10.3 mg/dL   Total Protein 7.6 6.5 - 8.1 g/dL   Albumin 4.3 3.5 - 5.0 g/dL   AST 21 15 - 41 U/L   ALT 21 14 - 54 U/L   Alkaline Phosphatase 103 38 - 126 U/L   Total Bilirubin 0.6 0.3 - 1.2 mg/dL   GFR calc non Af Amer >60 >60 mL/min   GFR calc Af Amer >60 >60 mL/min    Comment: (NOTE) The eGFR has been calculated using the CKD EPI equation. This calculation has not been validated in all clinical situations. eGFR's persistently <60 mL/min signify possible Chronic Kidney Disease.    Anion gap 11 5 - 15     Assessment & Plan  1. Essential hypertension -conmt. Amlodipine  2. Chronic pain -Return to Chronic pain management  3. COPD, mild -cont. To see Dr.  Raul Del 4. Arthritis  - meloxicam (MOBIC) 15 MG tablet; Take 1 tablet (15 mg total) by mouth daily.  Dispense: 30 tablet; Refill: 6  5.  Vitamin D deficiency  - Vitamin D (25 hydroxy)

## 2014-12-09 DIAGNOSIS — E559 Vitamin D deficiency, unspecified: Secondary | ICD-10-CM | POA: Diagnosis not present

## 2014-12-10 LAB — VITAMIN D 25 HYDROXY (VIT D DEFICIENCY, FRACTURES): Vit D, 25-Hydroxy: 44 ng/mL (ref 30.0–100.0)

## 2014-12-22 DIAGNOSIS — J449 Chronic obstructive pulmonary disease, unspecified: Secondary | ICD-10-CM | POA: Diagnosis not present

## 2015-01-21 DIAGNOSIS — J449 Chronic obstructive pulmonary disease, unspecified: Secondary | ICD-10-CM | POA: Diagnosis not present

## 2015-01-26 DIAGNOSIS — G8929 Other chronic pain: Secondary | ICD-10-CM | POA: Diagnosis not present

## 2015-02-20 DIAGNOSIS — R0602 Shortness of breath: Secondary | ICD-10-CM | POA: Diagnosis not present

## 2015-02-20 DIAGNOSIS — J432 Centrilobular emphysema: Secondary | ICD-10-CM | POA: Diagnosis not present

## 2015-02-20 DIAGNOSIS — G4734 Idiopathic sleep related nonobstructive alveolar hypoventilation: Secondary | ICD-10-CM | POA: Diagnosis not present

## 2015-02-21 DIAGNOSIS — J449 Chronic obstructive pulmonary disease, unspecified: Secondary | ICD-10-CM | POA: Diagnosis not present

## 2015-02-24 ENCOUNTER — Encounter: Payer: Self-pay | Admitting: Family Medicine

## 2015-02-24 ENCOUNTER — Ambulatory Visit (INDEPENDENT_AMBULATORY_CARE_PROVIDER_SITE_OTHER): Payer: Medicare Other | Admitting: Family Medicine

## 2015-02-24 VITALS — BP 150/78 | HR 83 | Temp 98.5°F | Resp 16 | Ht 65.0 in | Wt 224.0 lb

## 2015-02-24 DIAGNOSIS — H6122 Impacted cerumen, left ear: Secondary | ICD-10-CM

## 2015-02-24 DIAGNOSIS — Z23 Encounter for immunization: Secondary | ICD-10-CM

## 2015-02-24 NOTE — Progress Notes (Signed)
   Subjective:    Patient ID: Candace Tapia, female    DOB: 1935-06-17, 79 y.o.   MRN: [id number]  HPI: Candace Tapia is a 79 y.o. female presenting on 02/24/2015 for Cerumen Impaction   HPI  Pt presents for clogged ear. L ear. No ringing in ear. Feels itchy and stuffy. Poor hearing on L side. Sensation of something in the ear began a few days ago. No fevers, chills or congestion.   Past Medical History  Diagnosis Date  . Breast cancer ([place]) 2014    radiation and taking exemestane  . Cancer of left breast ([place]) 10/27/2014    Current Outpatient Prescriptions on File Prior to Visit  Medication Sig  . albuterol (PROAIR HFA) 108 (90 BASE) MCG/ACT inhaler Inhale into the lungs.  Marland Kitchen amLODipine (NORVASC) 5 MG tablet   . Ascorbic Acid (VITAMIN C) 100 MG tablet Take 100 mg by mouth daily.  . calcium gluconate 650 MG tablet Take 650 mg by mouth daily.  Marland Kitchen exemestane (AROMASIN) 25 MG tablet   . fexofenadine (ALLEGRA) 180 MG tablet Take 180 mg by mouth as needed.   . fluticasone (FLONASE) 50 MCG/ACT nasal spray Place into both nostrils daily.  Marland Kitchen NEXIUM 20 MG capsule   . SPIRIVA HANDIHALER 18 MCG inhalation capsule    No current facility-administered medications on file prior to visit.    Review of Systems Per HPI unless specifically indicated above     Objective:    BP 150/78 mmHg  Pulse 83  Temp(Src) 98.5 F (36.9 C) (Oral)  Resp 16  Ht 5\' 5"  (1.651 m)  Wt 224 lb (101.606 kg)  BMI 37.28 kg/m2  Wt Readings from Last 3 Encounters:  02/24/15 224 lb (101.606 kg)  11/28/14 217 lb 9.6 oz (98.703 kg)  10/27/14 217 lb 9.5 oz (98.7 kg)    Physical Exam Results for orders placed or performed in visit on 11/28/14  Vitamin D (25 hydroxy)  Result Value Ref Range   Vit D, 25-Hydroxy 44.0 30.0 - 100.0 ng/mL      Assessment & Plan:   Problem List Items Addressed This Visit    None    Visit Diagnoses    Cerumen impaction, left    -  Primary    Ear lavage provided. Recommend  OTC wax softening drops for further issues. RTC PRN.     Relevant Orders    Ear Lavage    Flu vaccine need        Relevant Orders    Flu vaccine HIGH DOSE PF (Fluzone High dose)       Meds ordered this encounter  Medications  . naproxen sodium (ANAPROX) 220 MG tablet    Sig: Take 220 mg by mouth 2 (two) times daily with a meal.      Follow up plan: No Follow-up on file.

## 2015-02-24 NOTE — Patient Instructions (Signed)
Cerumen Impaction The structures of the external ear canal secrete a waxy substance known as cerumen. Excess cerumen can build up in the ear canal, causing a condition known as cerumen impaction. Cerumen impaction can cause ear pain and disrupt the function of the ear. The rate of cerumen production differs for each individual. In certain individuals, the configuration of the ear canal may decrease his or her ability to naturally remove cerumen. CAUSES Cerumen impaction is caused by excessive cerumen production or buildup. RISK FACTORS  Frequent use of swabs to clean ears.  Having narrow ear canals.  Having eczema.  Being dehydrated. SIGNS AND SYMPTOMS  Diminished hearing.  Ear drainage.  Ear pain.  Ear itch. TREATMENT Treatment may involve:  Over-the-counter or prescription ear drops to soften the cerumen.  Removal of cerumen by a health care provider. This may be done with:  Irrigation with warm water. This is the most common method of removal.  Ear curettes and other instruments.  Surgery. This may be done in severe cases. HOME CARE INSTRUCTIONS  Take medicines only as directed by your health care provider.  Do not insert objects into the ear with the intent of cleaning the ear. PREVENTION  Do not insert objects into the ear, even with the intent of cleaning the ear. Removing cerumen as a part of normal hygiene is not necessary, and the use of swabs in the ear canal is not recommended.  Drink enough water to keep your urine clear or pale yellow.  Control your eczema if you have it. SEEK MEDICAL CARE IF:  You develop ear pain.  You develop bleeding from the ear.  The cerumen does not clear after you use ear drops as directed.   This information is not intended to replace advice given to you by your health care provider. Make sure you discuss any questions you have with your health care provider.   Document Released: 05/12/2004 Document Revised: 04/25/2014  Document Reviewed: 11/19/2014 Elsevier Interactive Patient Education 2016 [employer]  

## 2015-02-27 ENCOUNTER — Telehealth: Payer: Self-pay | Admitting: *Deleted

## 2015-02-27 MED ORDER — EXEMESTANE 25 MG PO TABS: 25.0000 mg | ORAL_TABLET | Freq: Every day | ORAL | Status: DC

## 2015-02-27 NOTE — Telephone Encounter (Signed)
Escribed

## 2015-03-03 ENCOUNTER — Encounter: Payer: Self-pay | Admitting: Family Medicine

## 2015-03-03 ENCOUNTER — Ambulatory Visit (INDEPENDENT_AMBULATORY_CARE_PROVIDER_SITE_OTHER): Payer: Medicare Other | Admitting: Family Medicine

## 2015-03-03 VITALS — BP 142/82 | HR 78 | Temp 97.7°F | Resp 16 | Ht 65.0 in | Wt 221.4 lb

## 2015-03-03 DIAGNOSIS — K219 Gastro-esophageal reflux disease without esophagitis: Secondary | ICD-10-CM | POA: Insufficient documentation

## 2015-03-03 DIAGNOSIS — M150 Primary generalized (osteo)arthritis: Secondary | ICD-10-CM | POA: Diagnosis not present

## 2015-03-03 DIAGNOSIS — J302 Other seasonal allergic rhinitis: Secondary | ICD-10-CM | POA: Insufficient documentation

## 2015-03-03 DIAGNOSIS — L853 Xerosis cutis: Secondary | ICD-10-CM | POA: Insufficient documentation

## 2015-03-03 DIAGNOSIS — G720 Drug-induced myopathy: Secondary | ICD-10-CM | POA: Insufficient documentation

## 2015-03-03 DIAGNOSIS — M791 Myalgia, unspecified site: Secondary | ICD-10-CM

## 2015-03-03 DIAGNOSIS — F325 Major depressive disorder, single episode, in full remission: Secondary | ICD-10-CM | POA: Insufficient documentation

## 2015-03-03 DIAGNOSIS — I10 Essential (primary) hypertension: Secondary | ICD-10-CM

## 2015-03-03 DIAGNOSIS — T466X5A Adverse effect of antihyperlipidemic and antiarteriosclerotic drugs, initial encounter: Secondary | ICD-10-CM | POA: Insufficient documentation

## 2015-03-03 DIAGNOSIS — J449 Chronic obstructive pulmonary disease, unspecified: Secondary | ICD-10-CM | POA: Diagnosis not present

## 2015-03-03 DIAGNOSIS — M159 Polyosteoarthritis, unspecified: Secondary | ICD-10-CM | POA: Insufficient documentation

## 2015-03-03 DIAGNOSIS — E876 Hypokalemia: Secondary | ICD-10-CM | POA: Insufficient documentation

## 2015-03-03 DIAGNOSIS — M549 Dorsalgia, unspecified: Secondary | ICD-10-CM | POA: Insufficient documentation

## 2015-03-03 DIAGNOSIS — F331 Major depressive disorder, recurrent, moderate: Secondary | ICD-10-CM | POA: Insufficient documentation

## 2015-03-03 DIAGNOSIS — R928 Other abnormal and inconclusive findings on diagnostic imaging of breast: Secondary | ICD-10-CM | POA: Insufficient documentation

## 2015-03-03 MED ORDER — LOSARTAN POTASSIUM 25 MG PO TABS: 25.0000 mg | ORAL_TABLET | Freq: Every day | ORAL | Status: DC

## 2015-03-03 NOTE — Progress Notes (Signed)
Name: Candace Tapia   MRN: [id number]    DOB: [birth year]   Date:03/03/2015       Progress Note  Subjective  Chief Complaint  Chief Complaint  Patient presents with  . Hypertension    HPI Here for f/u of HBP.  Her main c/o is arthritic pain.  She has Pain Management appt. In 2 days.  On OTC pain meds at present ([facility]A based).  Also has COPD.  Breathing doing ok./  Sees Dr. Raul Del for his.  No problem-specific assessment & plan notes found for this encounter.   Past Medical History  Diagnosis Date  . Breast cancer ([place]) 2014    radiation and taking exemestane  . Cancer of left breast ([place]) 10/27/2014    Social History  Substance Use Topics  . Smoking status: Former Research scientist (life sciences)  . Smokeless tobacco: Never Used  . Alcohol Use: No     Current outpatient prescriptions:  .  acetaminophen (TYLENOL) 500 MG tablet, Take by mouth., Disp: , Rfl:  .  albuterol (PROAIR HFA) 108 (90 BASE) MCG/ACT inhaler, Inhale into the lungs., Disp: , Rfl:  .  amLODipine (NORVASC) 5 MG tablet, Take by mouth., Disp: , Rfl:  .  Ascorbic Acid (VITAMIN C) 100 MG tablet, Take 100 mg by mouth daily., Disp: , Rfl:  .  calcium gluconate 650 MG tablet, Take 650 mg by mouth daily., Disp: , Rfl:  .  exemestane (AROMASIN) 25 MG tablet, Take by mouth., Disp: , Rfl:  .  fexofenadine (ALLEGRA ALLERGY) 180 MG tablet, Take by mouth., Disp: , Rfl:  .  fluticasone (FLONASE) 50 MCG/ACT nasal spray, Place into both nostrils daily., Disp: , Rfl:  .  fluticasone (FLOVENT DISKUS) 50 MCG/BLIST diskus inhaler, Place into the nose., Disp: , Rfl:  .  NEXIUM 20 MG capsule, , Disp: , Rfl:  .  tiotropium (SPIRIVA HANDIHALER) 18 MCG inhalation capsule, Place into inhaler and inhale., Disp: , Rfl:  .  HYDROcodone-acetaminophen (NORCO/VICODIN) 5-325 MG tablet, Take by mouth., Disp: , Rfl:  .  Multiple Minerals-Vitamins (CALCIUM & VIT D3 BONE HEALTH PO), Take 5,000 Units by mouth daily., Disp: , Rfl:  .  naproxen sodium (ANAPROX) 220 MG  tablet, Take 220 mg by mouth 2 (two) times daily with a meal., Disp: , Rfl:   Allergies  Allergen Reactions  . Penicillins Other (See Comments)  . Statins     Not sure what exact med was; just made her feel bad (can't elaborate)  . Penicillin G Rash  . Sulfa Antibiotics Other (See Comments) and Rash    Review of Systems  Constitutional: Positive for malaise/fatigue. Negative for fever, chills and weight loss.  HENT: Negative for hearing loss.   Eyes: Negative for blurred vision and double vision.  Respiratory: Negative for cough, shortness of breath and wheezing.   Cardiovascular: Negative for chest pain, palpitations and leg swelling.  Gastrointestinal: Negative for heartburn, abdominal pain and blood in stool.  Genitourinary: Negative for dysuria, urgency and frequency.  Musculoskeletal: Positive for joint pain (difuse).  Skin: Negative for rash.  Neurological: Negative for dizziness, tremors, weakness and headaches.      Objective  Filed Vitals:   03/03/15 1258  BP: 142/82  Pulse: 78  Temp: 97.7 F (36.5 C)  Resp: 16  Height: 5\' 5"  (1.651 m)  Weight: 221 lb 6.4 oz (100.426 kg)     Physical Exam  Constitutional: She is oriented to person, place, and time and well-developed, well-nourished, and in no distress.  No distress.  HENT:  Head: Normocephalic and atraumatic.  Eyes: Conjunctivae and EOM are normal. Pupils are equal, round, and reactive to light. No scleral icterus.  Neck: Normal range of motion. Neck supple. No thyromegaly present.    Cardiovascular: Normal rate, regular rhythm, normal heart sounds and intact distal pulses.  Exam reveals no gallop and no friction rub.   No murmur heard. Pulmonary/Chest: Effort normal and breath sounds normal. No respiratory distress. She has no wheezes. She has no rales.  Abdominal: Soft. Bowel sounds are normal. She exhibits no distension and no mass. There is no tenderness.  Musculoskeletal: She exhibits edema (trace  bilateral pedal edema).  Diffuse joint pain sec. To OA.  Lymphadenopathy:    She has no cervical adenopathy.  Neurological: She is alert and oriented to person, place, and time.  Vitals reviewed.     Recent Results (from the past 2160 hour(s))  Vitamin D (25 hydroxy)     Status: None   Collection Time: 12/09/14 10:52 AM  Result Value Ref Range   Vit D, 25-Hydroxy 44.0 30.0 - 100.0 ng/mL    Comment: Vitamin D deficiency has been defined by the [place] practice guideline as a level of serum 25-OH vitamin D less than 20 ng/mL (1,2). The Endocrine Society went on to further define vitamin D insufficiency as a level between 21 and 29 ng/mL (2). 1. IOM (Institute of Medicine). 2010. Dietary reference    intakes for calcium and D. [place]: The    [employer]. 2. Holick MF, Binkley Montgomery, Bischoff-Ferrari HA, et al.    Evaluation, treatment, and prevention of vitamin D    deficiency: an Endocrine Society clinical practice    guideline. JCEM. 2011 Jul; 96(7):1911-30.      Assessment & Plan  1. Essential hypertension  - losartan (COZAAR) 25 MG tablet; Take 1 tablet (25 mg total) by mouth daily.  Dispense: 30 tablet; Refill: 6 - cont. Amlodipine 2. COPD, mild ([place]) -cont. to f/u with Dr. Raul Del  3. Primary osteoarthritis involving multiple joints -keep Pain Management appt.

## 2015-03-05 ENCOUNTER — Ambulatory Visit: Payer: Medicare Other | Attending: Radiation Oncology | Admitting: Radiation Oncology

## 2015-03-05 ENCOUNTER — Encounter: Payer: Self-pay | Admitting: Pain Medicine

## 2015-03-05 ENCOUNTER — Ambulatory Visit: Payer: Medicare Other | Attending: Pain Medicine | Admitting: Pain Medicine

## 2015-03-05 VITALS — BP 148/71 | HR 93 | Temp 97.7°F | Resp 16 | Ht 65.0 in | Wt 223.0 lb

## 2015-03-05 DIAGNOSIS — F329 Major depressive disorder, single episode, unspecified: Secondary | ICD-10-CM | POA: Diagnosis not present

## 2015-03-05 DIAGNOSIS — C50912 Malignant neoplasm of unspecified site of left female breast: Secondary | ICD-10-CM | POA: Insufficient documentation

## 2015-03-05 DIAGNOSIS — K219 Gastro-esophageal reflux disease without esophagitis: Secondary | ICD-10-CM | POA: Insufficient documentation

## 2015-03-05 DIAGNOSIS — M179 Osteoarthritis of knee, unspecified: Secondary | ICD-10-CM | POA: Diagnosis not present

## 2015-03-05 DIAGNOSIS — M19019 Primary osteoarthritis, unspecified shoulder: Secondary | ICD-10-CM | POA: Insufficient documentation

## 2015-03-05 DIAGNOSIS — M170 Bilateral primary osteoarthritis of knee: Secondary | ICD-10-CM | POA: Insufficient documentation

## 2015-03-05 DIAGNOSIS — M5136 Other intervertebral disc degeneration, lumbar region: Secondary | ICD-10-CM | POA: Diagnosis not present

## 2015-03-05 DIAGNOSIS — M47817 Spondylosis without myelopathy or radiculopathy, lumbosacral region: Secondary | ICD-10-CM | POA: Diagnosis not present

## 2015-03-05 DIAGNOSIS — J45909 Unspecified asthma, uncomplicated: Secondary | ICD-10-CM | POA: Diagnosis not present

## 2015-03-05 DIAGNOSIS — I10 Essential (primary) hypertension: Secondary | ICD-10-CM | POA: Diagnosis not present

## 2015-03-05 DIAGNOSIS — F1120 Opioid dependence, uncomplicated: Secondary | ICD-10-CM | POA: Diagnosis not present

## 2015-03-05 DIAGNOSIS — M47816 Spondylosis without myelopathy or radiculopathy, lumbar region: Secondary | ICD-10-CM | POA: Insufficient documentation

## 2015-03-05 DIAGNOSIS — M5416 Radiculopathy, lumbar region: Secondary | ICD-10-CM | POA: Diagnosis not present

## 2015-03-05 DIAGNOSIS — M160 Bilateral primary osteoarthritis of hip: Secondary | ICD-10-CM | POA: Diagnosis not present

## 2015-03-05 DIAGNOSIS — M533 Sacrococcygeal disorders, not elsewhere classified: Secondary | ICD-10-CM | POA: Insufficient documentation

## 2015-03-05 DIAGNOSIS — M169 Osteoarthritis of hip, unspecified: Secondary | ICD-10-CM | POA: Diagnosis not present

## 2015-03-05 DIAGNOSIS — M19011 Primary osteoarthritis, right shoulder: Secondary | ICD-10-CM | POA: Insufficient documentation

## 2015-03-05 DIAGNOSIS — M19012 Primary osteoarthritis, left shoulder: Secondary | ICD-10-CM | POA: Diagnosis not present

## 2015-03-05 DIAGNOSIS — Z5181 Encounter for therapeutic drug level monitoring: Secondary | ICD-10-CM | POA: Diagnosis not present

## 2015-03-05 DIAGNOSIS — Z0389 Encounter for observation for other suspected diseases and conditions ruled out: Secondary | ICD-10-CM | POA: Diagnosis not present

## 2015-03-05 DIAGNOSIS — Z9889 Other specified postprocedural states: Secondary | ICD-10-CM | POA: Diagnosis not present

## 2015-03-05 DIAGNOSIS — R52 Pain, unspecified: Secondary | ICD-10-CM | POA: Diagnosis present

## 2015-03-05 DIAGNOSIS — C50911 Malignant neoplasm of unspecified site of right female breast: Secondary | ICD-10-CM | POA: Diagnosis not present

## 2015-03-05 DIAGNOSIS — Z79899 Other long term (current) drug therapy: Secondary | ICD-10-CM | POA: Diagnosis not present

## 2015-03-05 NOTE — Progress Notes (Signed)
Subjective:    Patient ID: Candace Tapia, female    DOB: 08-Aug-1935, 79 y.o.   MRN: [id number]  HPI  The patient is a 79 year old female who comes pain management Center at the request of Dr.Krasinski for further evaluation and treatment of pain involving the region of the lower back and lower extremity region. Patient also with history of pain involving the region of the shoulders and the knees area patient states that her pain is aggravated by standing walking and the pain becomes more severe as the day aggress on today's visit we reviewed prior radiographic studies of the lumbar spine which revealed patient to be with multilevel degenerative changes of the lumbar spine with spread formation and with multilevel facet arthropathy. Patient is with prior surgery of both shoulders. Patient also is with pain involving the region of the knees with evidence of degenerative changes of the knees noted on prior radiographic studies. On today's visit patient stated that the most severe pain involving the region of the lower back and lower extremity region . The patient described her pain as aching exhausting feeling of weight stabbing sensation awakening patient from sleep and. The patient appeared to go to sleep. Patient stated the pain increased with twisting and sitting. Patient stated the pain decreases with lying down and with medications. Patient states she is taking hydrocodone acetaminophen for consider appeared time been of some benefit. We discussed patient's condition and will consider patient for interventional treatment at time return appointment consisting of block of nerves to the sacroiliac joint. We will consider patient for lumbar facet medial branch nerve blocks and other procedures as well as modification of medications as discussed and explained to patient on today's visit. We also discussed the medication tramadol and patient stated that she may wish to try tramadol. We will consider  modifications of treatment regimen pending response to block of nerves to the sacroiliac joint to be performed at time return appointment and will consider patient for further evaluation and treatment. May consider interventional treatment of the knees consisting of geniculate nerve blocks of the knees as well as other procedures for pain involving the hips and shoulders. The patient was understanding and agreement suggested treatment plan.   Review of Systems      Cardiovascular: Hive blood pressure  Pulmonary: Asthma  Emphysema  Neurological: Unremarkable  Psychological: Depression   Gastrointestinal: Gastroesophageal reflux disease   Hiatal hernia  Genitourinary: Unremarkable  Hematologic: Unremarkable  Endocrine: Unremarkable  Rheumatological: Osteoarthritis   Musculoskeletal: Unremarkable  Other significant: Carcinoma of breast      Objective:   Physical Exam    there was tenderness to palpation splenius capitis and a separate talus region of mild to moderate degree. Patient appeared to be unremarkable Spurling's maneuver. Palpation of the acromioclavicular and glenohumeral joint regions reproduces moderate discomfort with well-healed surgical scars of the left and right shoulders noted there was decreased range of motion of the shoulders especially the left shoulder. Tinel and Phalen's maneuver were without increased pain of significant degree patient was with decreased grip strength. There was tenderness over the cervical facet cervical paraspinal musculature as well as the thoracic facet thoracic paraspinal musculature region with no crepitus of the thoracic region noted. Palpation over the lumbar paraspinal muscles lumbar facet region was attends palpation of moderate degree with lateral bending and rotation extension and palpation over the lumbar facets reproduced moderate discomfort on the left as well as on the right. Palpation over the PSIS and P  IIS  region reproduced moderate to moderately severe discomfort with severe tends to palpation over the gluteal and piriformis musculature regions as well there was positive Patrick's maneuver there was tends to palpation of the greater trochanteric region and iliotibial band region a moderate degree.. Knees were was tends to palpation with negative anterior and posterior drawer signs without ballottement of the patella. There was significant crepitus of the knees  Straight leg raising was tolerated to approximately 20 without increased pain with dorsiflexion noted. EHL strength appeared to be decreased with no definite sensory deficit or dermatomal distribution detected. There was negative clonus negative Homans. Abdomen was nontender there was no costovertebral angle tenderness noted      Assessment & Plan:   Degenerative disc disease of the lumbar spine Multilevel degenerative changes lumbar spine with  Facet degenerative changes at multiple levels and Spurling  Facet syndrome  Sacroiliac joint dysfunction  Degenerative joint disease of knees  Degenerative joint disease of shoulders (status post surgery of left and right shoulders)  Degenerative joint disease of hips  Carcinoma of the breasts     PLAN   Continue present medication Consider tramadol as discussed with patient Patient with prior use of hydrocodone acetaminophen Will consider tramadol and other medications as discussed  Block of nerves to the sacroiliac joint to be performed at time of return appointment  F/U PCP Dr. Luan Pulling for evaliation of  BP and general medical  condition  F/U surgical evaluation. Patient will follow-up with Dr. Mack Guise and we will consider further surgical evaluation as well  F/U neurological evaluation. May consider pending follow-up evaluations  May consider radiofrequency rhizolysis or intraspinal procedures pending response to present treatment and F/U evaluation   Patient to call  Pain Management Center should patient have concerns prior to scheduled return appointment.

## 2015-03-05 NOTE — Patient Instructions (Addendum)
Continue present medication  Block of nerves to the sacroiliac joint to be performed at time of return appointment  F/U PCP  Dr. Luan Pulling for evaliation of  BP and general medical  condition  F/U surgical evaluation. May consider pending follow-up evaluations  F/U neurological evaluation. May consider pending follow-up evaluations  May consider radiofrequency rhizolysis or intraspinal procedures pending response to present treatment and F/U evaluation   Patient to call Pain Management Center should patient have concerns prior to scheduled return appointment. Pain Management Discharge Instructions  General Discharge Instructions :  If you need to reach your doctor call: Monday-Friday 8:00 am - 4:00 pm at [phone] or toll free [phone].  After clinic hours [phone] to have operator reach doctor.  Bring all of your medication bottles to all your appointments in the pain clinic.  To cancel or reschedule your appointment with Pain Management please remember to call 24 hours in advance to avoid a fee.  Refer to the educational materials which you have been given on: General Risks, I had my Procedure. Discharge Instructions, Post Sedation.  Post Procedure Instructions:  The drugs you were given will stay in your system until tomorrow, so for the next 24 hours you should not drive, make any legal decisions or drink any alcoholic beverages.  You may eat anything you prefer, but it is better to start with liquids then soups and crackers, and gradually work up to solid foods.  Please notify your doctor immediately if you have any unusual bleeding, trouble breathing or pain that is not related to your normal pain.  Depending on the type of procedure that was done, some parts of your body may feel week and/or numb.  This usually clears up by tonight or the next day.  Walk with the use of an assistive device or accompanied by an adult for the 24 hours.  You may use ice on the  affected area for the first 24 hours.  Put ice in a Ziploc bag and cover with a towel and place against area 15 minutes on 15 minutes off.  You may switch to heat after 24 hours.Sacroiliac (SI) Joint Injection Patient Information  Description: The sacroiliac joint connects the scrum (very low back and tailbone) to the ilium (a pelvic bone which also forms half of the hip joint).  Normally this joint experiences very little motion.  When this joint becomes inflamed or unstable low back and or hip and pelvis pain may result.  Injection of this joint with local anesthetics (numbing medicines) and steroids can provide diagnostic information and reduce pain.  This injection is performed with the aid of x-ray guidance into the tailbone area while you are lying on your stomach.   You may experience an electrical sensation down the leg while this is being done.  You may also experience numbness.  We also may ask if we are reproducing your normal pain during the injection.  Conditions which may be treated SI injection:   Low back, buttock, hip or leg pain  Preparation for the Injection:  1. Do not eat any solid food or dairy products within 6 hours of your appointment.  2. You may drink clear liquids up to 2 hours before appointment.  Clear liquids include water, black coffee, juice or soda.  No milk or cream please. 3. You may take your regular medications, including pain medications with a sip of water before your appointment.  Diabetics should hold regular insulin (if take separately) and take 1/2  normal NPH dose the morning of the procedure.  Carry some sugar containing items with you to your appointment. 4. A driver must accompany you and be prepared to drive you home after your procedure. 5. Bring all of your current medications with you. 6. An IV may be inserted and sedation may be given at the discretion of the physician. 7. A blood pressure cuff, EKG and other monitors will often be applied during  the procedure.  Some patients may need to have extra oxygen administered for a short period.  8. You will be asked to provide medical information, including your allergies, prior to the procedure.  We must know immediately if you are taking blood thinners (like Coumadin/Warfarin) or if you are allergic to IV iodine contrast (dye).  We must know if you could possible be pregnant.  Possible side effects:   Bleeding from needle site  Infection (rare, may require surgery)  Nerve injury (rare)  Numbness & tingling (temporary)  A brief convulsion or seizure  Light-headedness (temporary)  Pain at injection site (several days)  Decreased blood pressure (temporary)  Weakness in the leg (temporary)   Call if you experience:   New onset weakness or numbness of an extremity below the injection site that last more than 8 hours.  Hives or difficulty breathing ( go to the emergency room)  Inflammation or drainage at the injection site  Any new symptoms which are concerning to you  Please note:  Although the local anesthetic injected can often make your back/ hip/ buttock/ leg feel good for several hours after the injections, the pain will likely return.  It takes 3-7 days for steroids to work in the sacroiliac area.  You may not notice any pain relief for at least that one week.  If effective, we will often do a series of three injections spaced 3-6 weeks apart to maximally decrease your pain.  After the initial series, we generally will wait some months before a repeat injection of the same type.  If you have any questions, please call [phone] [place]  What are the risk, side effects and possible complications? Generally speaking, most procedures are safe.  However, with any procedure there are risks, side effects, and the possibility of complications.  The risks and complications are dependent upon the  sites that are lesioned, or the type of nerve block to be performed.  The closer the procedure is to the spine, the more serious the risks are.  Great care is taken when placing the radio frequency needles, block needles or lesioning probes, but sometimes complications can occur. 1. Infection: Any time there is an injection through the skin, there is a risk of infection.  This is why sterile conditions are used for these blocks.  There are four possible types of infection. 1. Localized skin infection. 2. Central Nervous System Infection-This can be in the form of Meningitis, which can be deadly. 3. Epidural Infections-This can be in the form of an epidural abscess, which can cause pressure inside of the spine, causing compression of the spinal cord with subsequent paralysis. This would require an emergency surgery to decompress, and there are no guarantees that the patient would recover from the paralysis. 4. Discitis-This is an infection of the intervertebral discs.  It occurs in about 1% of discography procedures.  It is difficult to treat and it may lead to surgery.        2.  Pain: the needles have to go through skin and soft tissues, will cause soreness.       3. Damage to internal structures:  The nerves to be lesioned may be near blood vessels or    other nerves which can be potentially damaged.       4. Bleeding: Bleeding is more common if the patient is taking blood thinners such as  aspirin, Coumadin, Ticiid, Plavix, etc., or if he/she have some genetic predisposition  such as hemophilia. Bleeding into the spinal canal can cause compression of the spinal  cord with subsequent paralysis.  This would require an emergency surgery to  decompress and there are no guarantees that the patient would recover from the  paralysis.       5. Pneumothorax:  Puncturing of a lung is a possibility, every time a needle is introduced in  the area of the chest or upper back.  Pneumothorax refers to free air around  the  collapsed lung(s), inside of the thoracic cavity (chest cavity).  Another two possible  complications related to a similar event would include: Hemothorax and Chylothorax.   These are variations of the Pneumothorax, where instead of air around the collapsed  lung(s), you may have blood or chyle, respectively.       6. Spinal headaches: They may occur with any procedures in the area of the spine.       7. Persistent CSF (Cerebro-Spinal Fluid) leakage: This is a rare problem, but may occur  with prolonged intrathecal or epidural catheters either due to the formation of a fistulous  track or a dural tear.       8. Nerve damage: By working so close to the spinal cord, there is always a possibility of  nerve damage, which could be as serious as a permanent spinal cord injury with  paralysis.       9. Death:  Although rare, severe deadly allergic reactions known as "Anaphylactic  reaction" can occur to any of the medications used.      10. Worsening of the symptoms:  We can always make thing worse.  What are the chances of something like this happening? Chances of any of this occuring are extremely low.  By statistics, you have more of a chance of getting killed in a motor vehicle accident: while driving to the hospital than any of the above occurring .  Nevertheless, you should be aware that they are possibilities.  In general, it is similar to taking a shower.  Everybody knows that you can slip, hit your head and get killed.  Does that mean that you should not shower again?  Nevertheless always keep in mind that statistics do not mean anything if you happen to be on the wrong side of them.  Even if a procedure has a 1 (one) in a 1,000,000 (million) chance of going wrong, it you happen to be that one..Also, keep in mind that by statistics, you have more of a chance of having something go wrong when taking medications.  Who should not have this procedure? If you are on a blood thinning medication (e.g.  Coumadin, Plavix, see list of "Blood Thinners"), or if you have an active infection going on, you should not have the procedure.  If you are taking any blood thinners, please inform your physician.  How should I prepare for this procedure?  Do not eat or drink anything at least six hours prior to the procedure.  Bring a driver with  you .  It cannot be a taxi.  Come accompanied by an adult that can drive you back, and that is strong enough to help you if your legs get weak or numb from the local anesthetic.  Take all of your medicines the morning of the procedure with just enough water to swallow them.  If you have diabetes, make sure that you are scheduled to have your procedure done first thing in the morning, whenever possible.  If you have diabetes, take only half of your insulin dose and notify our nurse that you have done so as soon as you arrive at the clinic.  If you are diabetic, but only take blood sugar pills (oral hypoglycemic), then do not take them on the morning of your procedure.  You may take them after you have had the procedure.  Do not take aspirin or any aspirin-containing medications, at least eleven (11) days prior to the procedure.  They may prolong bleeding.  Wear loose fitting clothing that may be easy to take off and that you would not mind if it got stained with Betadine or blood.  Do not wear any jewelry or perfume  Remove any nail coloring.  It will interfere with some of our monitoring equipment.  NOTE: Remember that this is not meant to be interpreted as a complete list of all possible complications.  Unforeseen problems may occur.  BLOOD THINNERS The following drugs contain aspirin or other products, which can cause increased bleeding during surgery and should not be taken for 2 weeks prior to and 1 week after surgery.  If you should need take something for relief of minor pain, you may take acetaminophen which is found in Tylenol,m Datril, Anacin-3 and  Panadol. It is not blood thinner. The products listed below are.  Do not take any of the products listed below in addition to any listed on your instruction sheet.  A.P.C or A.P.C with Codeine Codeine Phosphate Capsules #3 Ibuprofen Ridaura  ABC compound Congesprin Imuran rimadil  Advil Cope Indocin Robaxisal  Alka-Seltzer Effervescent Pain Reliever and Antacid Coricidin or Coricidin-D  Indomethacin Rufen  Alka-Seltzer plus Cold Medicine Cosprin Ketoprofen S-A-C Tablets  Anacin Analgesic Tablets or Capsules Coumadin Korlgesic Salflex  Anacin Extra Strength Analgesic tablets or capsules CP-2 Tablets Lanoril Salicylate  Anaprox Cuprimine Capsules Levenox Salocol  Anexsia-D Dalteparin Magan Salsalate  Anodynos Darvon compound Magnesium Salicylate Sine-off  Ansaid Dasin Capsules Magsal Sodium Salicylate  Anturane Depen Capsules Marnal Soma  APF Arthritis pain formula Dewitt's Pills Measurin Stanback  Argesic Dia-Gesic Meclofenamic Sulfinpyrazone  Arthritis Bayer Timed Release Aspirin Diclofenac Meclomen Sulindac  Arthritis pain formula Anacin Dicumarol Medipren Supac  Analgesic (Safety coated) Arthralgen Diffunasal Mefanamic Suprofen  Arthritis Strength Bufferin Dihydrocodeine Mepro Compound Suprol  Arthropan liquid Dopirydamole Methcarbomol with Aspirin Synalgos  ASA tablets/Enseals Disalcid Micrainin Tagament  Ascriptin Doan's Midol Talwin  Ascriptin A/D Dolene Mobidin Tanderil  Ascriptin Extra Strength Dolobid Moblgesic Ticlid  Ascriptin with Codeine Doloprin or Doloprin with Codeine Momentum Tolectin  Asperbuf Duoprin Mono-gesic Trendar  Aspergum Duradyne Motrin or Motrin IB Triminicin  Aspirin plain, buffered or enteric coated Durasal Myochrisine Trigesic  Aspirin Suppositories Easprin Nalfon Trillsate  Aspirin with Codeine Ecotrin Regular or Extra Strength Naprosyn Uracel  Atromid-S Efficin Naproxen Ursinus  Auranofin Capsules Elmiron Neocylate Vanquish  Axotal Emagrin Norgesic  Verin  Azathioprine Empirin or Empirin with Codeine Normiflo Vitamin E  Azolid Emprazil Nuprin Voltaren  Bayer Aspirin plain, buffered or children's or timed BC Tablets or powders Encaprin  Orgaran Warfarin Sodium  Buff-a-Comp Enoxaparin Orudis Zorpin  Buff-a-Comp with Codeine Equegesic Os-Cal-Gesic   Buffaprin Excedrin plain, buffered or Extra Strength Oxalid   Bufferin Arthritis Strength Feldene Oxphenbutazone   Bufferin plain or Extra Strength Feldene Capsules Oxycodone with Aspirin   Bufferin with Codeine Fenoprofen Fenoprofen Pabalate or Pabalate-SF   Buffets II Flogesic Panagesic   Buffinol plain or Extra Strength Florinal or Florinal with Codeine Panwarfarin   Buf-Tabs Flurbiprofen Penicillamine   Butalbital Compound Four-way cold tablets Penicillin   Butazolidin Fragmin Pepto-Bismol   Carbenicillin Geminisyn Percodan   Carna Arthritis Reliever Geopen Persantine   Carprofen Gold's salt Persistin   Chloramphenicol Goody's Phenylbutazone   Chloromycetin Haltrain Piroxlcam   Clmetidine heparin Plaquenil   Cllnoril Hyco-pap Ponstel   Clofibrate Hydroxy chloroquine Propoxyphen         Before stopping any of these medications, be sure to consult the physician who ordered them.  Some, such as Coumadin (Warfarin) are ordered to prevent or treat serious conditions such as "deep thrombosis", "pumonary embolisms", and other heart problems.  The amount of time that you may need off of the medication may also vary with the medication and the reason for which you were taking it.  If you are taking any of these medications, please make sure you notify your pain physician before you undergo any procedures.

## 2015-03-05 NOTE — Progress Notes (Signed)
Safety precautions to be maintained throughout the outpatient stay will include: orient to surroundings, keep bed in low position, maintain call bell within reach at all times, provide assistance with transfer out of bed and ambulation.  

## 2015-03-19 ENCOUNTER — Other Ambulatory Visit: Payer: Self-pay | Admitting: Pain Medicine

## 2015-03-23 ENCOUNTER — Other Ambulatory Visit: Payer: Self-pay | Admitting: *Deleted

## 2015-03-23 DIAGNOSIS — J449 Chronic obstructive pulmonary disease, unspecified: Secondary | ICD-10-CM | POA: Diagnosis not present

## 2015-03-25 ENCOUNTER — Ambulatory Visit: Payer: Medicare Other | Attending: Pain Medicine | Admitting: Pain Medicine

## 2015-03-25 ENCOUNTER — Encounter: Payer: Self-pay | Admitting: Pain Medicine

## 2015-03-25 VITALS — BP 147/70 | HR 73 | Temp 98.2°F | Resp 16 | Ht 65.0 in | Wt 220.0 lb

## 2015-03-25 DIAGNOSIS — M545 Low back pain: Secondary | ICD-10-CM | POA: Diagnosis present

## 2015-03-25 DIAGNOSIS — M47816 Spondylosis without myelopathy or radiculopathy, lumbar region: Secondary | ICD-10-CM | POA: Insufficient documentation

## 2015-03-25 DIAGNOSIS — M5136 Other intervertebral disc degeneration, lumbar region: Secondary | ICD-10-CM | POA: Diagnosis not present

## 2015-03-25 DIAGNOSIS — M79605 Pain in left leg: Secondary | ICD-10-CM | POA: Diagnosis present

## 2015-03-25 DIAGNOSIS — M533 Sacrococcygeal disorders, not elsewhere classified: Secondary | ICD-10-CM

## 2015-03-25 DIAGNOSIS — M47817 Spondylosis without myelopathy or radiculopathy, lumbosacral region: Secondary | ICD-10-CM | POA: Diagnosis not present

## 2015-03-25 DIAGNOSIS — M19012 Primary osteoarthritis, left shoulder: Secondary | ICD-10-CM

## 2015-03-25 DIAGNOSIS — M160 Bilateral primary osteoarthritis of hip: Secondary | ICD-10-CM

## 2015-03-25 DIAGNOSIS — M79604 Pain in right leg: Secondary | ICD-10-CM | POA: Diagnosis present

## 2015-03-25 DIAGNOSIS — M19011 Primary osteoarthritis, right shoulder: Secondary | ICD-10-CM

## 2015-03-25 MED ORDER — LACTATED RINGERS IV SOLN: 1000.0000 mL | INTRAVENOUS | Status: DC

## 2015-03-25 MED ORDER — TRIAMCINOLONE ACETONIDE 40 MG/ML IJ SUSP
40.0000 mg | Freq: Once | INTRAMUSCULAR | Status: AC
  Administered 2015-03-25: 40 mg

## 2015-03-25 MED ORDER — MIDAZOLAM HCL 5 MG/5ML IJ SOLN
5.0000 mg | Freq: Once | INTRAMUSCULAR | Status: AC
  Administered 2015-03-25: 4 mg via INTRAVENOUS

## 2015-03-25 MED ORDER — BUPIVACAINE HCL (PF) 0.25 % IJ SOLN
30.0000 mL | Freq: Once | INTRAMUSCULAR | Status: AC
  Administered 2015-03-25: 30 mL

## 2015-03-25 MED ORDER — FENTANYL CITRATE (PF) 100 MCG/2ML IJ SOLN
INTRAMUSCULAR | Status: AC
  Filled 2015-03-25: qty 2

## 2015-03-25 MED ORDER — ORPHENADRINE CITRATE 30 MG/ML IJ SOLN
INTRAMUSCULAR | Status: AC
  Filled 2015-03-25: qty 2

## 2015-03-25 MED ORDER — DOXYCYCLINE HYCLATE 100 MG PO TABS: 100.0000 mg | ORAL_TABLET | Freq: Two times a day (BID) | ORAL | Status: DC

## 2015-03-25 MED ORDER — BUPIVACAINE HCL (PF) 0.25 % IJ SOLN
INTRAMUSCULAR | Status: AC
  Administered 2015-03-25: 30 mL
  Filled 2015-03-25: qty 120

## 2015-03-25 MED ORDER — ORPHENADRINE CITRATE 30 MG/ML IJ SOLN: 60.0000 mg | Freq: Once | INTRAMUSCULAR | Status: DC

## 2015-03-25 MED ORDER — FENTANYL CITRATE (PF) 100 MCG/2ML IJ SOLN
100.0000 ug | Freq: Once | INTRAMUSCULAR | Status: AC
  Administered 2015-03-25: 100 ug via INTRAVENOUS
  Administered 2015-03-25: 10:00:00 via INTRAVENOUS

## 2015-03-25 MED ORDER — MIDAZOLAM HCL 5 MG/5ML IJ SOLN
INTRAMUSCULAR | Status: AC
  Administered 2015-03-25: 4 mg via INTRAVENOUS
  Filled 2015-03-25: qty 5

## 2015-03-25 MED ORDER — TRIAMCINOLONE ACETONIDE 40 MG/ML IJ SUSP
INTRAMUSCULAR | Status: AC
  Administered 2015-03-25: 40 mg
  Filled 2015-03-25: qty 1

## 2015-03-25 NOTE — Patient Instructions (Addendum)
Continue present medication. Please get doxycycline antibiotic today and begin taking doxycycline as prescribed   F/U PCP  Dr. Luan Pulling for evaliation of  BP and general medical  condition  F/U surgical evaluation. May consider pending follow-up evaluations  F/U neurological evaluation. May consider pending follow-up evaluations  May consider radiofrequency rhizolysis or intraspinal procedures pending response to present treatment and F/U evaluation   Patient to call Pain Management Center should patient have concerns prior to scheduled return appointment.Selective Nerve Root Block Patient Information  Description: Specific nerve roots exit the spinal canal and these nerves can be compressed and inflamed by a bulging disc and bone spurs.  By injecting steroids on the nerve root, we can potentially decrease the inflammation surrounding these nerves, which often leads to decreased pain.  Also, by injecting local anesthesia on the nerve root, this can provide [place] helpful information to give to your referring doctor if it decreases your pain.  Selective nerve root blocks can be done along the spine from the neck to the low back depending on the location of your pain.   After numbing the skin with local anesthesia, a small needle is passed to the nerve root and the position of the needle is verified using x-ray pictures.  After the needle is in correct position, we then deposit the medication.  You may experience a pressure sensation while this is being done.  The entire block usually lasts less than 15 minutes.  Conditions that may be treated with selective nerve root blocks:  Low back and leg pain  Spinal stenosis  Diagnostic block prior to potential surgery  Neck and arm pain  Post laminectomy syndrome  Preparation for the injection:  1. Do not eat any solid food or dairy products within 6 hours of your appointment. 2. You may drink clear liquids up to 2 hours before an appointment.  Clear  liquids include water, black coffee, juice or soda.  No milk or cream please. 3. You may take your regular medications, including pain medications, with a sip of water before your appointment.  Diabetics should hold regular insulin (if taken separately) and take 1/2 normal NPH dose the morning of the procedure.  Carry some sugar containing items with you to your appointment. 4. A driver must accompany you and be prepared to drive you home after your procedure. 5. Bring all your current medications with you. 6. An IV may be inserted and sedation may be given at the discretion of the physician. 7. A blood pressure cuff, EKG, and other monitors will often be applied during the procedure.  Some patients may need to have extra oxygen administered for a short period. 8. You will be asked to provide medical information, including allergies, prior to the procedure.  We must know immediately if you are taking blood  Thinners (like Coumadin) or if you are allergic to IV iodine contrast (dye).  Possible side-effects: All are usually temporary  Bleeding from needle site  Light headedness  Numbness and tingling  Decreased blood pressure  Weakness in arms/legs  Pressure sensation in back/neck  Pain at injection site (several days)  Possible complications: All are extremely rare  Infection  Nerve injury  Spinal headache (a headache wore with upright position)  Call if you experience:  Fever/chills associated with headache or increased back/neck pain  Headache worsened by an upright position  New onset weakness or numbness of an extremity below the injection site  Hives or difficulty breathing (go to the emergency  room)  Inflammation or drainage at the injection site(s)  Severe back/neck pain greater than usual  New symptoms which are concerning to you  Please note:  Although the local anesthetic injected can often make your back or neck feel good for several hours after the  injection the pain will likely return.  It takes 3-5 days for steroids to work on the nerve root. You may not notice any pain relief for at least one week.  If effective, we will often do a series of 3 injections spaced 3-6 weeks apart to maximally decrease your pain.    If you have any questions, please call [phone] [facility] Dis[facility]  General Discharge Instructions :  If you need to reach your doctor call: Monday-Friday 8:00 am - 4:00 pm at [phone] or toll free [phone].  After clinic hours [phone] to have operator reach doctor.  Bring all of your medication bottles to all your appointments in the pain clinic.  To cancel or reschedule your appointment with Pain Management please remember to call 24 hours in advance to avoid a fee.  Refer to the educational materials which you have been given on: General Risks, I had my Procedure. Discharge Instructions, Post Sedation.  Post Procedure Instructions:  The drugs you were given will stay in your system until tomorrow, so for the next 24 hours you should not drive, make any legal decisions or drink any alcoholic beverages.  You may eat anything you prefer, but it is better to start with liquids then soups and crackers, and gradually work up to solid foods.  Please notify your doctor immediately if you have any unusual bleeding, trouble breathing or pain that is not related to your normal pain.  Depending on the type of procedure that was done, some parts of your body may feel week and/or numb.  This usually clears up by tonight or the next day.  Walk with the use of an assistive device or accompanied by an adult for the 24 hours.  You may use ice on the affected area for the first 24 hours.  Put ice in a Ziploc bag and cover with a towel and place against area 15 minutes on 15 minutes off.  You may switch to heat after 24 hours.

## 2015-03-25 NOTE — Progress Notes (Signed)
Safety precautions to be maintained throughout the outpatient stay will include: orient to surroundings, keep bed in low position, maintain call bell within reach at all times, provide assistance with transfer out of bed and ambulation.  

## 2015-03-25 NOTE — Progress Notes (Signed)
Subjective:    Patient ID: Candace Tapia, female    DOB: Apr 04, 1936, 79 y.o.   MRN: [id number]  HPI  PROCEDURE:  Block of nerves to the sacroiliac joint.   NOTE:  The patient is a 79 y.o. female who returns to the [facility] for further evaluation and treatment of pain involving the lower back and lower extremity region with pain in the region of the buttocks as well. Prior MRI studies reveal Degenerative disc disease of the lumbar spine Multilevel degenerative changes lumbar spine with  Facet degenerative changes at multiple levels and Spurling.   There is concern regarding a significant component of the patient's pain being due to sacroiliac joint dysfunction The risks, benefits, expectations of the procedure have been discussed and explained to the patient who is understanding and willing to proceed with interventional treatment in attempt to decrease severity of patient's symptoms, minimize the risk of medication escalation and  hopefully retard the progression of the patient's symptoms. We will proceed with what is felt to be a medically necessary procedure, block of nerves to the sacroiliac joint.   DESCRIPTION OF PROCEDURE:  Block of nerves to the sacroiliac joint.   The patient was taken to the fluoroscopy suite. With the patient in the prone position with EKG, blood pressure, pulse and pulse oximetry monitoring, IV Versed, IV fentanyl conscious sedation, Betadine prep of proposed entry site was performed.   Block of nerves at the L5 vertebral body level.   With the patient in prone position, under fluoroscopic guidance, a 22 -gauge needle was inserted at the L5 vertebral body level on the left side. With 15 degrees oblique orientation a 22 -gauge needle was inserted in the region known as Burton's eye or eye of the Scotty dog. Following documentation of needle placement in the area of Burton's eye or eye of the Scotty dog under fluoroscopic guidance, needle placement was  then accomplished at the sacral ala level on the left side.   Needle placement at the sacral ala.   With the patient in prone position under fluoroscopic guidance with AP view of the lumbosacral spine, a 22 -gauge needle was inserted in the region known as the sacral ala on the left side. Following documentation of needle placement on the left side under fluoroscopic guidance needle placement was then accomplished at the S1 foramen level.   Needle placement at the S1 foramen level.   With the patient in prone position under fluoroscopic guidance with AP view of the lumbosacral spine and cephalad orientation, a 22 -gauge needle was inserted at the superior and lateral border of the S1 foramen on the left side. Following documentation of needle placement at the S1 foramen level on the left side, needle placement was then accomplished at the S2 foramen level on the left side.   Needle placement at the S2 foramen level.   With the patient in prone position with AP view of the lumbosacral spine with cephalad orientation, a 22 - gauge needle was inserted at the superior and lateral border of the S2 foramen under fluoroscopic guidance on the left side. Following needle placement at the L5 vertebral body level, sacral ala, S1 foramen and S2 foramen on the left left left side, needle placement was verified on lateral view under fluoroscopic guidance.  Following needle placement documentation on lateral view, each needle was injected with 1 mL of 0.25% bupivacaine and Kenalog.   BLOCK OF THE NERVES TO SACROILIAC JOINT ON THE RIGHT  SIDE The procedure was performed on the right side at the same levels as was performed on the left side and utilizing the same technique as on the left side and was performed under fluoroscopic guidance as on the left side   A total of 10mg  of Kenalog was utilized for the procedure.   PLAN:  1. Medications: The patient will continue presently prescribed medications.  2. The  patient will be considered for modification of treatment regimen pending response to the procedure performed on today's visit.  3. The patient is to follow-up with primary care physician Dr. Luan Pulling for evaluation of blood pressure and general medical condition following the procedure performed on today's visit.  4. Surgical evaluation as discussed.  5. Neurological evaluation as discussed.  6. The patient may be a candidate for radiofrequency procedures, implantation devices and other treatment pending response to treatment performed on today's visit and follow-up evaluation.  7. The patient has been advised to adhere to proper body mechanics and to avoid activities which may exacerbate the patient's symptoms.   Return appointment to Pain Management Center as scheduled.    Review of Systems     Objective:   Physical Exam        Assessment & Plan:

## 2015-03-26 NOTE — Telephone Encounter (Signed)
No problems post procedure. 

## 2015-04-23 ENCOUNTER — Ambulatory Visit: Payer: Medicare Other | Admitting: Pain Medicine

## 2015-04-23 DIAGNOSIS — J449 Chronic obstructive pulmonary disease, unspecified: Secondary | ICD-10-CM | POA: Diagnosis not present

## 2015-04-24 ENCOUNTER — Telehealth: Payer: Self-pay | Admitting: Pain Medicine

## 2015-04-24 NOTE — Telephone Encounter (Signed)
Due to coming weather resched 04-30-15 to 05-04-15 for meds

## 2015-04-25 NOTE — Telephone Encounter (Signed)
Thank you :)

## 2015-04-27 ENCOUNTER — Ambulatory Visit: Payer: Medicare Other | Admitting: Pain Medicine

## 2015-04-29 ENCOUNTER — Inpatient Hospital Stay: Payer: Medicare Other | Attending: Oncology | Admitting: Oncology

## 2015-04-29 ENCOUNTER — Inpatient Hospital Stay: Payer: Medicare Other

## 2015-04-29 VITALS — BP 185/95 | HR 93 | Temp 97.4°F | Resp 18 | Wt 220.0 lb

## 2015-04-29 DIAGNOSIS — Z79899 Other long term (current) drug therapy: Secondary | ICD-10-CM | POA: Diagnosis not present

## 2015-04-29 DIAGNOSIS — Z923 Personal history of irradiation: Secondary | ICD-10-CM | POA: Diagnosis not present

## 2015-04-29 DIAGNOSIS — C50912 Malignant neoplasm of unspecified site of left female breast: Secondary | ICD-10-CM | POA: Insufficient documentation

## 2015-04-29 DIAGNOSIS — Z79811 Long term (current) use of aromatase inhibitors: Secondary | ICD-10-CM | POA: Diagnosis not present

## 2015-04-29 DIAGNOSIS — Z87891 Personal history of nicotine dependence: Secondary | ICD-10-CM | POA: Diagnosis not present

## 2015-04-29 DIAGNOSIS — M1990 Unspecified osteoarthritis, unspecified site: Secondary | ICD-10-CM | POA: Diagnosis not present

## 2015-04-29 DIAGNOSIS — Z170 Estrogen receptor positive status [ER+]: Secondary | ICD-10-CM | POA: Diagnosis not present

## 2015-04-29 DIAGNOSIS — M8580 Other specified disorders of bone density and structure, unspecified site: Secondary | ICD-10-CM | POA: Diagnosis not present

## 2015-04-29 DIAGNOSIS — C50412 Malignant neoplasm of upper-outer quadrant of left female breast: Secondary | ICD-10-CM

## 2015-04-29 LAB — CBC WITH DIFFERENTIAL/PLATELET
BASOS ABS: 0.1 10*3/uL (ref 0–0.1)
BASOS PCT: 1 %
Eosinophils Absolute: 0.2 10*3/uL (ref 0–0.7)
Eosinophils Relative: 3 %
HEMATOCRIT: 43 % (ref 35.0–47.0)
HEMOGLOBIN: 14.5 g/dL (ref 12.0–16.0)
LYMPHS PCT: 21 %
Lymphs Abs: 1.6 10*3/uL (ref 1.0–3.6)
MCH: 29.9 pg (ref 26.0–34.0)
MCHC: 33.8 g/dL (ref 32.0–36.0)
MCV: 88.6 fL (ref 80.0–100.0)
MONO ABS: 0.8 10*3/uL (ref 0.2–0.9)
MONOS PCT: 10 %
NEUTROS ABS: 4.9 10*3/uL (ref 1.4–6.5)
NEUTROS PCT: 65 %
Platelets: 267 10*3/uL (ref 150–440)
RBC: 4.85 MIL/uL (ref 3.80–5.20)
RDW: 14 % (ref 11.5–14.5)
WBC: 7.5 10*3/uL (ref 3.6–11.0)

## 2015-04-29 LAB — COMPREHENSIVE METABOLIC PANEL
ALBUMIN: 4.2 g/dL (ref 3.5–5.0)
ALK PHOS: 104 U/L (ref 38–126)
ALT: 17 U/L (ref 14–54)
AST: 17 U/L (ref 15–41)
Anion gap: 8 (ref 5–15)
BILIRUBIN TOTAL: 0.4 mg/dL (ref 0.3–1.2)
BUN: 14 mg/dL (ref 6–20)
CALCIUM: 9.3 mg/dL (ref 8.9–10.3)
CO2: 29 mmol/L (ref 22–32)
CREATININE: 0.76 mg/dL (ref 0.44–1.00)
Chloride: 99 mmol/L — ABNORMAL LOW (ref 101–111)
GFR calc Af Amer: >60 mL/min (ref >60)
GLUCOSE: 99 mg/dL (ref 65–99)
POTASSIUM: 3.4 mmol/L — AB (ref 3.5–5.1)
Sodium: 136 mmol/L (ref 135–145)
TOTAL PROTEIN: 7.3 g/dL (ref 6.5–8.1)

## 2015-04-29 NOTE — Progress Notes (Signed)
[place] @ [facility] [phone]  Fax:([phone]) [place]: 1935/12/17  MR#: [id number]  GBT#:[id number]  Patient Care Team: Arlis Porta., MD as PCP - General (Family Medicine)  CHIEF COMPLAINT:  Chief Complaint  Patient presents with  . Breast Cancer    Oncology History   80 year old female status post wide local excision of the left breast for a stage I (T1 B. N0 M0) invasive mammary carcinoma ER positive PR borderline HER-2/neu not overexpressed She has  finished radiation therapy on September of 2014 She has declined any genetic study Started on Femara  in November of 2014 Alysia Penna was Discontinued because of side effect and may 2015. Started on Aromasin in May of  2015 HPI:        Cancer of left breast ([place])   10/27/2014 Initial Diagnosis Cancer of left breast    INTERVAL HISTORY:  Patient is here for further evaluation and treatment consideration regarding history of carcinoma of left breast. She is status post lumpectomy, radiation therapy. She is currently on Aromasin and tolerating treatment very well. She continues with joint pains that she relates to chronic arthritic pains that she's had since she turned 67. Most recent mammogram was in June 2018 and reported as BI-RADS 2. She also had a bone density scan in June 2016 that showed osteopenia. She continues with routine follow-up with the pain clinic, Dr. Primus Bravo due to chronic back pain. She otherwise denies any new bony pain or fracture.   REVIEW OF SYSTEMS:   GENERAL:  Feels good.  Active.  No fevers, sweats or weight loss. PERFORMANCE STATUS (ECOG):  0 HEENT:  No visual changes, runny nose, sore throat, mouth sores or tenderness. Lungs: No shortness of breath or cough.  No hemoptysis. Cardiac:  No chest pain, palpitations, orthopnea, or PND. GI:  No nausea, vomiting, diarrhea, constipation, melena or hematochezia. GU:  No urgency, frequency, dysuria, or  hematuria. Musculoskeletal:  No back pain.  No joint pain.  No muscle tenderness. Extremities:  No pain or swelling. Skin:  No rashes or skin changes. Neuro:  No headache, numbness or weakness, balance or coordination issues. Endocrine:  No diabetes, thyroid issues, hot flashes or night sweats. Psych:  No mood changes, depression or anxiety. Pain:  No focal pain. Review of systems:  All other systems reviewed and found to be negative. As per HPI. Otherwise, a complete review of systems is negatve.  PAST MEDICAL HISTORY: Past Medical History  Diagnosis Date  . Breast cancer ([place]) 2014    radiation and taking exemestane  . Cancer of left breast ([place]) 10/27/2014  . Allergy     PAST SURGICAL HISTORY: Past Surgical History  Procedure Laterality Date  . Breast surgery      FAMILY HISTORY Family History  Problem Relation Age of Onset  . Breast cancer Mother 17  . Breast cancer Sister 38  . Kidney disease Father     ADVANCED DIRECTIVES:  Patient does not have any living will or healthcare power of attorney.  Patient declined to get any information .  Available resources had been discussed.  We will follow-up on subsequent appointments regarding this issue HEALTH MAINTENANCE: Social History  Substance Use Topics  . Smoking status: Former Research scientist (life sciences)  . Smokeless tobacco: Never Used  . Alcohol Use: No      Allergies  Allergen Reactions  . Penicillins Other (See Comments)  . Statins     Not sure what exact  med was; just made her feel bad (can't elaborate)  . Penicillin G Rash  . Sulfa Antibiotics Other (See Comments) and Rash    Current Outpatient Prescriptions  Medication Sig Dispense Refill  . acetaminophen (TYLENOL) 500 MG tablet Take by mouth as needed.     Marland Kitchen albuterol (PROAIR HFA) 108 (90 BASE) MCG/ACT inhaler Inhale into the lungs.    Marland Kitchen amLODipine (NORVASC) 5 MG tablet Take 5 mg by mouth daily.     . Ascorbic Acid (VITAMIN C) 100 MG tablet Take 100 mg by mouth daily.     . calcium gluconate 650 MG tablet Take 650 mg by mouth daily.    Marland Kitchen exemestane (AROMASIN) 25 MG tablet Take by mouth.    . fexofenadine (ALLEGRA ALLERGY) 180 MG tablet Take by mouth as needed.     . fluticasone (FLONASE) 50 MCG/ACT nasal spray Place into both nostrils as needed.     . fluticasone (FLOVENT DISKUS) 50 MCG/BLIST diskus inhaler Place into the nose as needed.     Marland Kitchen losartan (COZAAR) 25 MG tablet Take 1 tablet (25 mg total) by mouth daily. 30 tablet 6  . naproxen sodium (ANAPROX) 220 MG tablet Take 220 mg by mouth 2 (two) times daily with a meal.    . NEXIUM 20 MG capsule     . tiotropium (SPIRIVA HANDIHALER) 18 MCG inhalation capsule Place into inhaler and inhale.    . Multiple Minerals-Vitamins (CALCIUM & VIT D3 BONE HEALTH PO) Take 5,000 Units by mouth daily. Reported on 04/29/2015     Current Facility-Administered Medications  Medication Dose Route Frequency Provider Last Rate Last Dose  . lactated ringers infusion 1,000 mL  1,000 mL Intravenous Continuous Mohammed Kindle, MD      . orphenadrine (NORFLEX) injection 60 mg  60 mg Intramuscular Once Mohammed Kindle, MD        OBJECTIVE:  Filed Vitals:   04/29/15 1027  BP: 185/95  Pulse: 93  Temp: 97.4 F (36.3 C)  Resp: 18     Body mass index is 36.61 kg/(m^2).    ECOG FS:0 - Asymptomatic  PHYSICAL EXAM: GENERAL:  Well developed, well nourished, sitting comfortably in the exam room in no acute distress. MENTAL STATUS:  Alert and oriented to person, place and time. HEAD:  Normocephalic, atraumatic, face symmetric, no Cushingoid features. EYES: Pupils equal round and reactive to light and accomodation.  No conjunctivitis or scleral icterus. ENT:  Oropharynx clear without lesion.  Tongue normal. Mucous membranes moist.  RESPIRATORY:  Clear to auscultation without rales, wheezes or rhonchi. CARDIOVASCULAR:  Regular rate and rhythm without murmur, rub or gallop. BREAST:  Right breast without masses, skin changes or nipple  discharge.  Left breast Lumpectomy site with scar tissue.  No palpable masses.  Radiation changes on the skin.   ABDOMEN:  Soft, non-tender, with active bowel sounds, and no hepatosplenomegaly.  No masses. BACK:  No CVA tenderness.  No tenderness on percussion of the back or rib cage. SKIN:  No rashes, ulcers or lesions. EXTREMITIES: No edema, no skin discoloration or tenderness.  No palpable cords. LYMPH NODES: No palpable cervical, supraclavicular, axillary adenopathy  NEUROLOGICAL: Unremarkable. PSYCH:  Appropriate.   LAB RESULTS:  Appointment on 04/29/2015  Component Date Value Ref Range Status  . WBC 04/29/2015 7.5  3.6 - 11.0 K/uL Final  . RBC 04/29/2015 4.85  3.80 - 5.20 MIL/uL Final  . Hemoglobin 04/29/2015 14.5  12.0 - 16.0 g/dL Final  . HCT 04/29/2015 43.0  35.0 -  47.0 % Final  . MCV 04/29/2015 88.6  80.0 - 100.0 fL Final  . MCH 04/29/2015 29.9  26.0 - 34.0 pg Final  . MCHC 04/29/2015 33.8  32.0 - 36.0 g/dL Final  . RDW 04/29/2015 14.0  11.5 - 14.5 % Final  . Platelets 04/29/2015 267  150 - 440 K/uL Final  . Neutrophils Relative % 04/29/2015 65   Final  . Neutro Abs 04/29/2015 4.9  1.4 - 6.5 K/uL Final  . Lymphocytes Relative 04/29/2015 21   Final  . Lymphs Abs 04/29/2015 1.6  1.0 - 3.6 K/uL Final  . Monocytes Relative 04/29/2015 10   Final  . Monocytes Absolute 04/29/2015 0.8  0.2 - 0.9 K/uL Final  . Eosinophils Relative 04/29/2015 3   Final  . Eosinophils Absolute 04/29/2015 0.2  0 - 0.7 K/uL Final  . Basophils Relative 04/29/2015 1   Final  . Basophils Absolute 04/29/2015 0.1  0 - 0.1 K/uL Final  . Sodium 04/29/2015 136  135 - 145 mmol/L Final  . Potassium 04/29/2015 3.4* 3.5 - 5.1 mmol/L Final  . Chloride 04/29/2015 99* 101 - 111 mmol/L Final  . CO2 04/29/2015 29  22 - 32 mmol/L Final  . Glucose, Bld 04/29/2015 99  65 - 99 mg/dL Final  . BUN 04/29/2015 14  6 - 20 mg/dL Final  . Creatinine, Ser 04/29/2015 0.76  0.44 - 1.00 mg/dL Final  . Calcium 04/29/2015 9.3   8.9 - 10.3 mg/dL Final  . Total Protein 04/29/2015 7.3  6.5 - 8.1 g/dL Final  . Albumin 04/29/2015 4.2  3.5 - 5.0 g/dL Final  . AST 04/29/2015 17  15 - 41 U/L Final  . ALT 04/29/2015 17  14 - 54 U/L Final  . Alkaline Phosphatase 04/29/2015 104  38 - 126 U/L Final  . Total Bilirubin 04/29/2015 0.4  0.3 - 1.2 mg/dL Final  . GFR calc non Af Amer 04/29/2015 >60  >60 mL/min Final  . GFR calc Af Amer 04/29/2015 >60  >60 mL/min Final   Comment: (NOTE) The eGFR has been calculated using the CKD EPI equation. This calculation has not been validated in all clinical situations. eGFR's persistently <60 mL/min signify possible Chronic Kidney Disease.   . Anion gap 04/29/2015 8  5 - 15 Final      STUDIES: No results found.  ASSESSMENT: 1. Carcinoma of left breast.  PLAN: 1. Breast cancer. Stage I disease, ER positive on Aromasin. She also continues with calcium and Vitamin D, vitamin D deficiency managed by primary care physician. Bone density dated June of 2016 osteopenia will be followed if needed further treatment would be added. Mammogram in June reported as BI-RADS 2, negative.  2. Chronic arthritis. The patient follows with pain clinic, Dr. Primus Bravo, next appointment is January 16 for spinal injections. She denies any new or acute bone pain or changes in arthritic pain.  We'll continue with routine follow-up in approximately 6 months.  Patient expressed understanding and was in agreement with this plan. She also understands that She can call clinic at any time with any questions, concerns, or complaints.    No matching staging information was found for the patient.  Evlyn Kanner, NP   04/29/2015 11:19 AM

## 2015-05-04 ENCOUNTER — Ambulatory Visit: Payer: Medicare Other | Attending: Pain Medicine | Admitting: Pain Medicine

## 2015-05-04 ENCOUNTER — Encounter: Payer: Self-pay | Admitting: Pain Medicine

## 2015-05-04 VITALS — BP 169/92 | HR 94 | Temp 98.0°F | Resp 16 | Ht 65.0 in | Wt 210.0 lb

## 2015-05-04 DIAGNOSIS — M160 Bilateral primary osteoarthritis of hip: Secondary | ICD-10-CM

## 2015-05-04 DIAGNOSIS — M19011 Primary osteoarthritis, right shoulder: Secondary | ICD-10-CM

## 2015-05-04 DIAGNOSIS — M545 Low back pain: Secondary | ICD-10-CM | POA: Diagnosis not present

## 2015-05-04 DIAGNOSIS — M47816 Spondylosis without myelopathy or radiculopathy, lumbar region: Secondary | ICD-10-CM | POA: Diagnosis not present

## 2015-05-04 DIAGNOSIS — M5136 Other intervertebral disc degeneration, lumbar region: Secondary | ICD-10-CM | POA: Diagnosis not present

## 2015-05-04 DIAGNOSIS — M533 Sacrococcygeal disorders, not elsewhere classified: Secondary | ICD-10-CM | POA: Diagnosis not present

## 2015-05-04 DIAGNOSIS — C50911 Malignant neoplasm of unspecified site of right female breast: Secondary | ICD-10-CM | POA: Insufficient documentation

## 2015-05-04 DIAGNOSIS — M791 Myalgia: Secondary | ICD-10-CM | POA: Diagnosis not present

## 2015-05-04 DIAGNOSIS — M51369 Other intervertebral disc degeneration, lumbar region without mention of lumbar back pain or lower extremity pain: Secondary | ICD-10-CM

## 2015-05-04 DIAGNOSIS — M19012 Primary osteoarthritis, left shoulder: Secondary | ICD-10-CM | POA: Diagnosis not present

## 2015-05-04 DIAGNOSIS — M5416 Radiculopathy, lumbar region: Secondary | ICD-10-CM | POA: Diagnosis not present

## 2015-05-04 DIAGNOSIS — M79606 Pain in leg, unspecified: Secondary | ICD-10-CM | POA: Diagnosis present

## 2015-05-04 DIAGNOSIS — Z9889 Other specified postprocedural states: Secondary | ICD-10-CM | POA: Insufficient documentation

## 2015-05-04 DIAGNOSIS — M47817 Spondylosis without myelopathy or radiculopathy, lumbosacral region: Secondary | ICD-10-CM | POA: Diagnosis not present

## 2015-05-04 DIAGNOSIS — M170 Bilateral primary osteoarthritis of knee: Secondary | ICD-10-CM | POA: Diagnosis not present

## 2015-05-04 DIAGNOSIS — C50912 Malignant neoplasm of unspecified site of left female breast: Secondary | ICD-10-CM | POA: Diagnosis not present

## 2015-05-04 NOTE — Progress Notes (Signed)
   Subjective:    Patient ID: Candace Tapia, female    DOB: Mar 22, 1936, 80 y.o.   MRN: [id number]  HPI  The patient is a 80 year old female who returns to pain management for further evaluation and treatment of pain involving the region of the lower back and lower extremity region. The patient has had significant improvement of pain with previous procedure performed in pain management Center. The patient is noted some return of pain at this time with pain radiating from the lumbar region for the buttocks on the left as well as on the right. The patient is without trauma and states that the pain is present throughout the day and becomes more intense as patient spends more time on the feet. It appears the patient ability to obtain restful sleep. We will proceed with interventional treatment block of nerves to the sacroiliac joint at time return appointment in attempt to decrease severity of patient's symptoms, minimize progression of symptoms, and avoid the need for more involved treatment. The patient agrees with suggested treatment plan    Review of Systems     Objective:   Physical Exam  There was tends to palpation of the paraspinal must reason cervical region cervical facet region a mild degree there was mild tenderness of the splenius capitis and occipitalis region. Palpation over the region of the cervical facet cervical paraspinal musculature region and the thoracic facet thoracic paraspinal musculatures reproduced pain of mild degree. No crepitus of the thoracic region was noted. Patient appeared to be with bilaterally equal grip strength and Tinel and Phalen's maneuver were without increased pain of significant degree. There was minimal tense to palpation of the acromioclavicular and glenohumeral joint regions and patient appeared to be with unremarkable Spurling's maneuver. Palpation over the lumbar paraspinal muscular region lumbar facet region was with moderate tends to palpation with  lateral bending rotation extension and palpation of the lumbar facets reproducing moderate discomfort. There was moderate to moderately severe tenderness to palpation over the PSIS and PII S region as well as the gluteal and piriformis muscles regions. There was mild tenderness of the greater trochanteric region and iliotibial band region. Straight leg raise was tolerates approximately 20 without an increase of pain with dorsiflexion noted. There was negative clonus negative Homans. Abdomen nontender with no costovertebral tenderness noted.      Assessment & Plan:     Degenerative disc disease of the lumbar spine Multilevel degenerative changes lumbar spine with  Facet degenerative changes at multiple levels and Spurling  Facet syndrome  Sacroiliac joint dysfunction  Degenerative joint disease of knees  Degenerative joint disease of shoulders (status post surgery of left and right shoulders)  Degenerative joint disease of hips  Carcinoma of the breasts      PLAN   Continue present medication  Block of nerves to the sacroiliac joint to be performed at time of return appointment  F/U PCP  Dr. Luan Pulling for evaliation of  BP and general medical  condition  F/U surgical evaluation. May consider pending follow-up evaluations  F/U neurological evaluation. May consider pending follow-up evaluations  May consider radiofrequency rhizolysis or intraspinal procedures pending response to present treatment and F/U evaluation   Patient to call Pain Management Center should patient have concerns prior to scheduled return appointment.

## 2015-05-04 NOTE — Progress Notes (Signed)
Safety precautions to be maintained throughout the outpatient stay will include: orient to surroundings, keep bed in low position, maintain call bell within reach at all times, provide assistance with transfer out of bed and ambulation.  

## 2015-05-04 NOTE — Patient Instructions (Addendum)
Continue present medication  Block of nerves to the sacroiliac joint to be performed at time of return appointment  F/U PCP  Dr. Luan Pulling for evaliation of  BP and general medical  condition  F/U surgical evaluation. May consider pending follow-up evaluations  F/U neurological evaluation. May consider pending follow-up evaluations  May consider radiofrequency rhizolysis or intraspinal procedures pending response to present treatment and F/U evaluation   Patient to call Pain Management Center should patient have concerns prior to scheduled return appointment.Pain Management Discharge Instructions  General Discharge Instructions :  If you need to reach your doctor call: Monday-Friday 8:00 am - 4:00 pm at [phone] or toll free [phone].  After clinic hours [phone] to have operator reach doctor.  Bring all of your medication bottles to all your appointments in the pain clinic.  To cancel or reschedule your appointment with Pain Management please remember to call 24 hours in advance to avoid a fee.  Refer to the educational materials which you have been given on: General Risks, I had my Procedure. Discharge Instructions, Post Sedation.  Post Procedure Instructions:  The drugs you were given will stay in your system until tomorrow, so for the next 24 hours you should not drive, make any legal decisions or drink any alcoholic beverages.  You may eat anything you prefer, but it is better to start with liquids then soups and crackers, and gradually work up to solid foods.  Please notify your doctor immediately if you have any unusual bleeding, trouble breathing or pain that is not related to your normal pain.  Depending on the type of procedure that was done, some parts of your body may feel week and/or numb.  This usually clears up by tonight or the next day.  Walk with the use of an assistive device or accompanied by an adult for the 24 hours.  You may use ice on the  affected area for the first 24 hours.  Put ice in a Ziploc bag and cover with a towel and place against area 15 minutes on 15 minutes off.  You may switch to heat after 24 hours.GENERAL RISKS AND COMPLICATIONS  What are the risk, side effects and possible complications? Generally speaking, most procedures are safe.  However, with any procedure there are risks, side effects, and the possibility of complications.  The risks and complications are dependent upon the sites that are lesioned, or the type of nerve block to be performed.  The closer the procedure is to the spine, the more serious the risks are.  Great care is taken when placing the radio frequency needles, block needles or lesioning probes, but sometimes complications can occur. 1. Infection: Any time there is an injection through the skin, there is a risk of infection.  This is why sterile conditions are used for these blocks.  There are four possible types of infection. 1. Localized skin infection. 2. Central Nervous System Infection-This can be in the form of Meningitis, which can be deadly. 3. Epidural Infections-This can be in the form of an epidural abscess, which can cause pressure inside of the spine, causing compression of the spinal cord with subsequent paralysis. This would require an emergency surgery to decompress, and there are no guarantees that the patient would recover from the paralysis. 4. Discitis-This is an infection of the intervertebral discs.  It occurs in about 1% of discography procedures.  It is difficult to treat and it may lead to surgery.  2. Pain: the needles have to go through skin and soft tissues, will cause soreness.       3. Damage to internal structures:  The nerves to be lesioned may be near blood vessels or    other nerves which can be potentially damaged.       4. Bleeding: Bleeding is more common if the patient is taking blood thinners such as  aspirin, Coumadin, Ticiid, Plavix, etc., or if he/she  have some genetic predisposition  such as hemophilia. Bleeding into the spinal canal can cause compression of the spinal  cord with subsequent paralysis.  This would require an emergency surgery to  decompress and there are no guarantees that the patient would recover from the  paralysis.       5. Pneumothorax:  Puncturing of a lung is a possibility, every time a needle is introduced in  the area of the chest or upper back.  Pneumothorax refers to free air around the  collapsed lung(s), inside of the thoracic cavity (chest cavity).  Another two possible  complications related to a similar event would include: Hemothorax and Chylothorax.   These are variations of the Pneumothorax, where instead of air around the collapsed  lung(s), you may have blood or chyle, respectively.       6. Spinal headaches: They may occur with any procedures in the area of the spine.       7. Persistent CSF (Cerebro-Spinal Fluid) leakage: This is a rare problem, but may occur  with prolonged intrathecal or epidural catheters either due to the formation of a fistulous  track or a dural tear.       8. Nerve damage: By working so close to the spinal cord, there is always a possibility of  nerve damage, which could be as serious as a permanent spinal cord injury with  paralysis.       9. Death:  Although rare, severe deadly allergic reactions known as "Anaphylactic  reaction" can occur to any of the medications used.      10. Worsening of the symptoms:  We can always make thing worse.  What are the chances of something like this happening? Chances of any of this occuring are extremely low.  By statistics, you have more of a chance of getting killed in a motor vehicle accident: while driving to the hospital than any of the above occurring .  Nevertheless, you should be aware that they are possibilities.  In general, it is similar to taking a shower.  Everybody knows that you can slip, hit your head and get killed.  Does that mean that  you should not shower again?  Nevertheless always keep in mind that statistics do not mean anything if you happen to be on the wrong side of them.  Even if a procedure has a 1 (one) in a 1,000,000 (million) chance of going wrong, it you happen to be that one..Also, keep in mind that by statistics, you have more of a chance of having something go wrong when taking medications.  Who should not have this procedure? If you are on a blood thinning medication (e.g. Coumadin, Plavix, see list of "Blood Thinners"), or if you have an active infection going on, you should not have the procedure.  If you are taking any blood thinners, please inform your physician.  How should I prepare for this procedure?  Do not eat or drink anything at least six hours prior to the procedure.  Bring a driver  with you .  It cannot be a taxi.  Come accompanied by an adult that can drive you back, and that is strong enough to help you if your legs get weak or numb from the local anesthetic.  Take all of your medicines the morning of the procedure with just enough water to swallow them.  If you have diabetes, make sure that you are scheduled to have your procedure done first thing in the morning, whenever possible.  If you have diabetes, take only half of your insulin dose and notify our nurse that you have done so as soon as you arrive at the clinic.  If you are diabetic, but only take blood sugar pills (oral hypoglycemic), then do not take them on the morning of your procedure.  You may take them after you have had the procedure.  Do not take aspirin or any aspirin-containing medications, at least eleven (11) days prior to the procedure.  They may prolong bleeding.  Wear loose fitting clothing that may be easy to take off and that you would not mind if it got stained with Betadine or blood.  Do not wear any jewelry or perfume  Remove any nail coloring.  It will interfere with some of our monitoring equipment.  NOTE:  Remember that this is not meant to be interpreted as a complete list of all possible complications.  Unforeseen problems may occur.  BLOOD THINNERS The following drugs contain aspirin or other products, which can cause increased bleeding during surgery and should not be taken for 2 weeks prior to and 1 week after surgery.  If you should need take something for relief of minor pain, you may take acetaminophen which is found in Tylenol,m Datril, Anacin-3 and Panadol. It is not blood thinner. The products listed below are.  Do not take any of the products listed below in addition to any listed on your instruction sheet.  A.P.C or A.P.C with Codeine Codeine Phosphate Capsules #3 Ibuprofen Ridaura  ABC compound Congesprin Imuran rimadil  Advil Cope Indocin Robaxisal  Alka-Seltzer Effervescent Pain Reliever and Antacid Coricidin or Coricidin-D  Indomethacin Rufen  Alka-Seltzer plus Cold Medicine Cosprin Ketoprofen S-A-C Tablets  Anacin Analgesic Tablets or Capsules Coumadin Korlgesic Salflex  Anacin Extra Strength Analgesic tablets or capsules CP-2 Tablets Lanoril Salicylate  Anaprox Cuprimine Capsules Levenox Salocol  Anexsia-D Dalteparin Magan Salsalate  Anodynos Darvon compound Magnesium Salicylate Sine-off  Ansaid Dasin Capsules Magsal Sodium Salicylate  Anturane Depen Capsules Marnal Soma  APF Arthritis pain formula Dewitt's Pills Measurin Stanback  Argesic Dia-Gesic Meclofenamic Sulfinpyrazone  Arthritis Bayer Timed Release Aspirin Diclofenac Meclomen Sulindac  Arthritis pain formula Anacin Dicumarol Medipren Supac  Analgesic (Safety coated) Arthralgen Diffunasal Mefanamic Suprofen  Arthritis Strength Bufferin Dihydrocodeine Mepro Compound Suprol  Arthropan liquid Dopirydamole Methcarbomol with Aspirin Synalgos  ASA tablets/Enseals Disalcid Micrainin Tagament  Ascriptin Doan's Midol Talwin  Ascriptin A/D Dolene Mobidin Tanderil  Ascriptin Extra Strength Dolobid Moblgesic Ticlid   Ascriptin with Codeine Doloprin or Doloprin with Codeine Momentum Tolectin  Asperbuf Duoprin Mono-gesic Trendar  Aspergum Duradyne Motrin or Motrin IB Triminicin  Aspirin plain, buffered or enteric coated Durasal Myochrisine Trigesic  Aspirin Suppositories Easprin Nalfon Trillsate  Aspirin with Codeine Ecotrin Regular or Extra Strength Naprosyn Uracel  Atromid-S Efficin Naproxen Ursinus  Auranofin Capsules Elmiron Neocylate Vanquish  Axotal Emagrin Norgesic Verin  Azathioprine Empirin or Empirin with Codeine Normiflo Vitamin E  Azolid Emprazil Nuprin Voltaren  Bayer Aspirin plain, buffered or children's or timed BC Tablets or powders  Encaprin Orgaran Warfarin Sodium  Buff-a-Comp Enoxaparin Orudis Zorpin  Buff-a-Comp with Codeine Equegesic Os-Cal-Gesic   Buffaprin Excedrin plain, buffered or Extra Strength Oxalid   Bufferin Arthritis Strength Feldene Oxphenbutazone   Bufferin plain or Extra Strength Feldene Capsules Oxycodone with Aspirin   Bufferin with Codeine Fenoprofen Fenoprofen Pabalate or Pabalate-SF   Buffets II Flogesic Panagesic   Buffinol plain or Extra Strength Florinal or Florinal with Codeine Panwarfarin   Buf-Tabs Flurbiprofen Penicillamine   Butalbital Compound Four-way cold tablets Penicillin   Butazolidin Fragmin Pepto-Bismol   Carbenicillin Geminisyn Percodan   Carna Arthritis Reliever Geopen Persantine   Carprofen Gold's salt Persistin   Chloramphenicol Goody's Phenylbutazone   Chloromycetin Haltrain Piroxlcam   Clmetidine heparin Plaquenil   Cllnoril Hyco-pap Ponstel   Clofibrate Hydroxy chloroquine Propoxyphen         Before stopping any of these medications, be sure to consult the physician who ordered them.  Some, such as Coumadin (Warfarin) are ordered to prevent or treat serious conditions such as "deep thrombosis", "pumonary embolisms", and other heart problems.  The amount of time that you may need off of the medication may also vary with the medication  and the reason for which you were taking it.  If you are taking any of these medications, please make sure you notify your pain physician before you undergo any procedures.         Sacroiliac (SI) Joint Injection Patient Information  Description: The sacroiliac joint connects the scrum (very low back and tailbone) to the ilium (a pelvic bone which also forms half of the hip joint).  Normally this joint experiences very little motion.  When this joint becomes inflamed or unstable low back and or hip and pelvis pain may result.  Injection of this joint with local anesthetics (numbing medicines) and steroids can provide diagnostic information and reduce pain.  This injection is performed with the aid of x-ray guidance into the tailbone area while you are lying on your stomach.   You may experience an electrical sensation down the leg while this is being done.  You may also experience numbness.  We also may ask if we are reproducing your normal pain during the injection.  Conditions which may be treated SI injection:   Low back, buttock, hip or leg pain  Preparation for the Injection:  1. Do not eat any solid food or dairy products within 6 hours of your appointment.  2. You may drink clear liquids up to 2 hours before appointment.  Clear liquids include water, black coffee, juice or soda.  No milk or cream please. 3. You may take your regular medications, including pain medications with a sip of water before your appointment.  Diabetics should hold regular insulin (if take separately) and take 1/2 normal NPH dose the morning of the procedure.  Carry some sugar containing items with you to your appointment. 4. A driver must accompany you and be prepared to drive you home after your procedure. 5. Bring all of your current medications with you. 6. An IV may be inserted and sedation may be given at the discretion of the physician. 7. A blood pressure cuff, EKG and other monitors will often be  applied during the procedure.  Some patients may need to have extra oxygen administered for a short period.  8. You will be asked to provide medical information, including your allergies, prior to the procedure.  We must know immediately if you are taking blood thinners (like Coumadin/Warfarin) or if  you are allergic to IV iodine contrast (dye).  We must know if you could possible be pregnant.  Possible side effects:   Bleeding from needle site  Infection (rare, may require surgery)  Nerve injury (rare)  Numbness & tingling (temporary)  A brief convulsion or seizure  Light-headedness (temporary)  Pain at injection site (several days)  Decreased blood pressure (temporary)  Weakness in the leg (temporary)   Call if you experience:   New onset weakness or numbness of an extremity below the injection site that last more than 8 hours.  Hives or difficulty breathing ( go to the emergency room)  Inflammation or drainage at the injection site  Any new symptoms which are concerning to you  Please note:  Although the local anesthetic injected can often make your back/ hip/ buttock/ leg feel good for several hours after the injections, the pain will likely return.  It takes 3-7 days for steroids to work in the sacroiliac area.  You may not notice any pain relief for at least that one week.  If effective, we will often do a series of three injections spaced 3-6 weeks apart to maximally decrease your pain.  After the initial series, we generally will wait some months before a repeat injection of the same type.  If you have any questions, please call [phone] [place] not take any aspirin. You may take one tablet of aleve in the morning and one tablet at night per Dr. Primus Bravo; and  He will write your pain medications  At the next visit.

## 2015-05-05 ENCOUNTER — Other Ambulatory Visit: Payer: Self-pay | Admitting: Family Medicine

## 2015-05-11 ENCOUNTER — Ambulatory Visit: Payer: Medicare Other | Attending: Pain Medicine | Admitting: Pain Medicine

## 2015-05-11 ENCOUNTER — Encounter: Payer: Self-pay | Admitting: Pain Medicine

## 2015-05-11 VITALS — BP 144/60 | HR 75 | Temp 98.6°F | Resp 16 | Ht 65.0 in | Wt 210.0 lb

## 2015-05-11 DIAGNOSIS — M19012 Primary osteoarthritis, left shoulder: Secondary | ICD-10-CM

## 2015-05-11 DIAGNOSIS — M533 Sacrococcygeal disorders, not elsewhere classified: Secondary | ICD-10-CM

## 2015-05-11 DIAGNOSIS — M47817 Spondylosis without myelopathy or radiculopathy, lumbosacral region: Secondary | ICD-10-CM | POA: Diagnosis not present

## 2015-05-11 DIAGNOSIS — M47816 Spondylosis without myelopathy or radiculopathy, lumbar region: Secondary | ICD-10-CM | POA: Diagnosis not present

## 2015-05-11 DIAGNOSIS — M545 Low back pain: Secondary | ICD-10-CM | POA: Insufficient documentation

## 2015-05-11 DIAGNOSIS — M19011 Primary osteoarthritis, right shoulder: Secondary | ICD-10-CM

## 2015-05-11 DIAGNOSIS — M5136 Other intervertebral disc degeneration, lumbar region: Secondary | ICD-10-CM | POA: Diagnosis not present

## 2015-05-11 DIAGNOSIS — M51369 Other intervertebral disc degeneration, lumbar region without mention of lumbar back pain or lower extremity pain: Secondary | ICD-10-CM

## 2015-05-11 DIAGNOSIS — M79606 Pain in leg, unspecified: Secondary | ICD-10-CM | POA: Diagnosis present

## 2015-05-11 DIAGNOSIS — M160 Bilateral primary osteoarthritis of hip: Secondary | ICD-10-CM

## 2015-05-11 MED ORDER — FENTANYL CITRATE (PF) 100 MCG/2ML IJ SOLN
100.0000 ug | Freq: Once | INTRAMUSCULAR | Status: AC
  Administered 2015-05-11: 100 ug via INTRAVENOUS

## 2015-05-11 MED ORDER — LACTATED RINGERS IV SOLN: 1000.0000 mL | INTRAVENOUS | Status: DC

## 2015-05-11 MED ORDER — ORPHENADRINE CITRATE 30 MG/ML IJ SOLN
INTRAMUSCULAR | Status: AC
  Filled 2015-05-11: qty 2

## 2015-05-11 MED ORDER — TRIAMCINOLONE ACETONIDE 40 MG/ML IJ SUSP: 40.0000 mg | Freq: Once | INTRAMUSCULAR | Status: DC

## 2015-05-11 MED ORDER — BUPIVACAINE HCL (PF) 0.25 % IJ SOLN
INTRAMUSCULAR | Status: AC
  Administered 2015-05-11: 13:00:00
  Filled 2015-05-11: qty 30

## 2015-05-11 MED ORDER — BUPIVACAINE HCL (PF) 0.25 % IJ SOLN: 30.0000 mL | Freq: Once | INTRAMUSCULAR | Status: DC

## 2015-05-11 MED ORDER — DOXYCYCLINE HYCLATE 100 MG PO TABS: 100.0000 mg | ORAL_TABLET | Freq: Two times a day (BID) | ORAL | Status: DC

## 2015-05-11 MED ORDER — MIDAZOLAM HCL 5 MG/5ML IJ SOLN
5.0000 mg | Freq: Once | INTRAMUSCULAR | Status: AC
  Administered 2015-05-11: 2 mg via INTRAVENOUS

## 2015-05-11 MED ORDER — MIDAZOLAM HCL 5 MG/5ML IJ SOLN
INTRAMUSCULAR | Status: AC
  Administered 2015-05-11: 2 mg via INTRAVENOUS
  Filled 2015-05-11: qty 5

## 2015-05-11 MED ORDER — FENTANYL CITRATE (PF) 100 MCG/2ML IJ SOLN
INTRAMUSCULAR | Status: AC
  Administered 2015-05-11: 100 ug via INTRAVENOUS
  Filled 2015-05-11: qty 2

## 2015-05-11 MED ORDER — ORPHENADRINE CITRATE 30 MG/ML IJ SOLN: 60.0000 mg | Freq: Once | INTRAMUSCULAR | Status: DC

## 2015-05-11 MED ORDER — TRIAMCINOLONE ACETONIDE 40 MG/ML IJ SUSP
INTRAMUSCULAR | Status: AC
  Administered 2015-05-11: 13:00:00
  Filled 2015-05-11: qty 1

## 2015-05-11 NOTE — Patient Instructions (Addendum)
PLAN  Continue present medication. Please get doxycycline antibiotic today and begin taking doxycycline as prescribed   F/U PCP  Dr. Luan Pulling for evaliation of  BP and general medical  condition  F/U surgical evaluation. May consider pending follow-up evaluations  F/U neurological evaluation. May consider pending follow-up evaluations  May consider radiofrequency rhizolysis or intraspinal procedures pending response to present treatment and F/U evaluation   Patient to call Pain Management Center should patient have concerns prior to scheduled return appointment.Sacroiliac (SI) Joint Injection Patient Information  Description: The sacroiliac joint connects the scrum (very low back and tailbone) to the ilium (a pelvic bone which also forms half of the hip joint).  Normally this joint experiences very little motion.  When this joint becomes inflamed or unstable low back and or hip and pelvis pain may result.  Injection of this joint with local anesthetics (numbing medicines) and steroids can provide diagnostic information and reduce pain.  This injection is performed with the aid of x-ray guidance into the tailbone area while you are lying on your stomach.   You may experience an electrical sensation down the leg while this is being done.  You may also experience numbness.  We also may ask if we are reproducing your normal pain during the injection.  Conditions which may be treated SI injection:   Low back, buttock, hip or leg pain  Preparation for the Injection:  1. Do not eat any solid food or dairy products within 6 hours of your appointment.  2. You may drink clear liquids up to 2 hours before appointment.  Clear liquids include water, black coffee, juice or soda.  No milk or cream please. 3. You may take your regular medications, including pain medications with a sip of water before your appointment.  Diabetics should hold regular insulin (if take separately) and take 1/2 normal NPH dose  the morning of the procedure.  Carry some sugar containing items with you to your appointment. 4. A driver must accompany you and be prepared to drive you home after your procedure. 5. Bring all of your current medications with you. 6. An IV may be inserted and sedation may be given at the discretion of the physician. 7. A blood pressure cuff, EKG and other monitors will often be applied during the procedure.  Some patients may need to have extra oxygen administered for a short period.  8. You will be asked to provide medical information, including your allergies, prior to the procedure.  We must know immediately if you are taking blood thinners (like Coumadin/Warfarin) or if you are allergic to IV iodine contrast (dye).  We must know if you could possible be pregnant.  Possible side effects:   Bleeding from needle site  Infection (rare, may require surgery)  Nerve injury (rare)  Numbness & tingling (temporary)  A brief convulsion or seizure  Light-headedness (temporary)  Pain at injection site (several days)  Decreased blood pressure (temporary)  Weakness in the leg (temporary)   Call if you experience:   New onset weakness or numbness of an extremity below the injection site that last more than 8 hours.  Hives or difficulty breathing ( go to the emergency room)  Inflammation or drainage at the injection site  Any new symptoms which are concerning to you  Please note:  Although the local anesthetic injected can often make your back/ hip/ buttock/ leg feel good for several hours after the injections, the pain will likely return.  It takes 3-7  days for steroids to work in the sacroiliac area.  You may not notice any pain relief for at least that one week.  If effective, we will often do a series of three injections spaced 3-6 weeks apart to maximally decrease your pain.  After the initial series, we generally will wait some months before a repeat injection of the same  type.  If you have any questions, please call [phone] [facility]  Pain Management Discharge Instructions  General Discharge Instructions :  If you need to reach your doctor call: Monday-Friday 8:00 am - 4:00 pm at [phone] or toll free [phone].  After clinic hours [phone] to have operator reach doctor.  Bring all of your medication bottles to all your appointments in the pain clinic.  To cancel or reschedule your appointment with Pain Management please remember to call 24 hours in advance to avoid a fee.  Refer to the educational materials which you have been given on: General Risks, I had my Procedure. Discharge Instructions, Post Sedation.  Post Procedure Instructions:  The drugs you were given will stay in your system until tomorrow, so for the next 24 hours you should not drive, make any legal decisions or drink any alcoholic beverages.  You may eat anything you prefer, but it is better to start with liquids then soups and crackers, and gradually work up to solid foods.  Please notify your doctor immediately if you have any unusual bleeding, trouble breathing or pain that is not related to your normal pain.  Depending on the type of procedure that was done, some parts of your body may feel week and/or numb.  This usually clears up by tonight or the next day.  Walk with the use of an assistive device or accompanied by an adult for the 24 hours.  You may use ice on the affected area for the first 24 hours.  Put ice in a Ziploc bag and cover with a towel and place against area 15 minutes on 15 minutes off.  You may switch to heat after 24 hours.

## 2015-05-11 NOTE — Progress Notes (Signed)
Subjective:    Patient ID: Candace Tapia, female    DOB: 1935/08/26, 80 y.o.   MRN: [id number]  HPI  PROCEDURE:  Block of nerves to the sacroiliac joint.   NOTE:  The patient is a 80 y.o. female who returns to the [facility] for further evaluation and treatment of pain involving the lower back and lower extremity region with pain in the region of the buttocks as well. Prior MRI studies reveal Degenerative disc disease of the lumbar spine Multilevel degenerative changes lumbar spine with  Facet degenerative changes at multiple levels and Spurling. The patient is with reproduction of severe pain with palpation over the PSIS and PII S regions.   There is concern regarding a significant component of the patient's pain being due to sacroiliac joint dysfunction The risks, benefits, expectations of the procedure have been discussed and explained to the patient who is understanding and willing to proceed with interventional treatment in attempt to decrease severity of patient's symptoms, minimize the risk of medication escalation and  hopefully retard the progression of the patient's symptoms. We will proceed with what is felt to be a medically necessary procedure, block of nerves to the sacroiliac joint.   DESCRIPTION OF PROCEDURE:  Block of nerves to the sacroiliac joint.   The patient was taken to the fluoroscopy suite. With the patient in the prone position with EKG, blood pressure, pulse and pulse oximetry monitoring, IV Versed, IV fentanyl conscious sedation, Betadine prep of proposed entry site was performed.   Block of nerves at the L5 vertebral body level.   With the patient in prone position, under fluoroscopic guidance, a 22 -gauge needle was inserted at the L5 vertebral body level on the right right side. With 15 degrees oblique orientation a 22 -gauge needle was inserted in the region known as Burton's eye or eye of the Scotty dog. Following documentation of needle placement  in the area of Burton's eye or eye of the Scotty dog under fluoroscopic guidance, needle placement was then accomplished at the sacral ala level on the right side.   Needle placement at the sacral ala.   With the patient in prone position under fluoroscopic guidance with AP view of the lumbosacral spine, a 22 -gauge needle was inserted in the region known as the sacral ala on the right side. Following documentation of needle placement on the right side under fluoroscopic guidance needle placement was then accomplished at the S1 foramen level.   Needle placement at the S1 foramen level.   With the patient in prone position under fluoroscopic guidance with AP view of the lumbosacral spine and cephalad orientation, a 22 -gauge needle was inserted at the superior and lateral border of the S1 foramen on the right side. Following documentation of needle placement at the S1 foramen level on the right side, needle placement was then accomplished at the S2 foramen level on the right side.   Needle placement at the S2 foramen level.   With the patient in prone position with AP view of the lumbosacral spine with cephalad orientation, a 22 - gauge needle was inserted at the superior and lateral border of the S2 foramen under fluoroscopic guidance on the right side. Following needle placement at the L5 vertebral body level, sacral ala, S1 foramen and S2 foramen on the right side, needle placement was verified on lateral view under fluoroscopic guidance.  Following needle placement documentation on lateral view, each needle was injected with 1 mL of  0.25% bupivacaine and Kenalog.    A total of 10mg  of Kenalog was utilized for the procedure.   PLAN:  1. Medications: The patient will continue presently prescribed medications.  2. The patient will be considered for modification of treatment regimen pending response to the procedure performed on today's visit.  3. The patient is to follow-up with primary care  physician Dr. Luan Pulling for evaluation of blood pressure and general medical condition following the procedure performed on today's visit. The patient is to follow-up with Dr. Luan Pulling Tuesday, 05/12/2015 for evaluation of blood pressure as discussed. Patient tolerated procedure well 4. Surgical evaluation as discussed. Has been addressed 5. Neurological evaluation as discussed. We will avoid [facility] EMG studies at this time 6. The patient may be a candidate for radiofrequency procedures, implantation devices and other treatment pending response to treatment performed on today's visit and follow-up evaluation.  7. The patient has been advised to adhere to proper body mechanics and to avoid activities which may exacerbate the patient's symptoms.   Return appointment to [facility] as scheduled.    Review of Systems     Objective:   Physical Exam        Assessment & Plan:

## 2015-05-11 NOTE — Progress Notes (Signed)
   Subjective:    Patient ID: Candace Tapia, female    DOB: [birth year], 80 y.o.   MRN: [id number]  HPI    Review of Systems     Objective:   Physical Exam        Assessment & Plan:

## 2015-05-11 NOTE — Progress Notes (Signed)
Safety precautions to be maintained throughout the outpatient stay will include: orient to surroundings, keep bed in low position, maintain call bell within reach at all times, provide assistance with transfer out of bed and ambulation.  

## 2015-05-12 ENCOUNTER — Telehealth: Payer: Self-pay | Admitting: *Deleted

## 2015-05-12 NOTE — Telephone Encounter (Signed)
No problems post procedure. 

## 2015-05-24 DIAGNOSIS — J449 Chronic obstructive pulmonary disease, unspecified: Secondary | ICD-10-CM | POA: Diagnosis not present

## 2015-05-25 ENCOUNTER — Other Ambulatory Visit: Payer: Self-pay | Admitting: Family Medicine

## 2015-06-02 ENCOUNTER — Other Ambulatory Visit: Payer: Self-pay | Admitting: Oncology

## 2015-06-04 ENCOUNTER — Ambulatory Visit (INDEPENDENT_AMBULATORY_CARE_PROVIDER_SITE_OTHER): Payer: Medicare Other | Admitting: Family Medicine

## 2015-06-04 ENCOUNTER — Encounter: Payer: Self-pay | Admitting: Family Medicine

## 2015-06-04 VITALS — BP 122/82 | HR 72 | Resp 16 | Ht 65.5 in | Wt 221.0 lb

## 2015-06-04 DIAGNOSIS — J302 Other seasonal allergic rhinitis: Secondary | ICD-10-CM | POA: Diagnosis not present

## 2015-06-04 DIAGNOSIS — F329 Major depressive disorder, single episode, unspecified: Secondary | ICD-10-CM | POA: Diagnosis not present

## 2015-06-04 DIAGNOSIS — C50412 Malignant neoplasm of upper-outer quadrant of left female breast: Secondary | ICD-10-CM | POA: Diagnosis not present

## 2015-06-04 DIAGNOSIS — F32A Depression, unspecified: Secondary | ICD-10-CM

## 2015-06-04 DIAGNOSIS — K219 Gastro-esophageal reflux disease without esophagitis: Secondary | ICD-10-CM | POA: Diagnosis not present

## 2015-06-04 DIAGNOSIS — E785 Hyperlipidemia, unspecified: Secondary | ICD-10-CM

## 2015-06-04 DIAGNOSIS — J449 Chronic obstructive pulmonary disease, unspecified: Secondary | ICD-10-CM | POA: Diagnosis not present

## 2015-06-04 DIAGNOSIS — E559 Vitamin D deficiency, unspecified: Secondary | ICD-10-CM | POA: Diagnosis not present

## 2015-06-04 DIAGNOSIS — I10 Essential (primary) hypertension: Secondary | ICD-10-CM | POA: Diagnosis not present

## 2015-06-04 DIAGNOSIS — M1990 Unspecified osteoarthritis, unspecified site: Secondary | ICD-10-CM

## 2015-06-04 NOTE — Progress Notes (Signed)
Name: Candace Tapia   MRN: [id number]    DOB: June 20, 1935   Date:06/04/2015       Progress Note  Subjective  Chief Complaint  Chief Complaint  Patient presents with  . Hypertension  . COPD  . Arthritis    very stiff    Hypertension Associated symptoms include malaise/fatigue and shortness of breath. Pertinent negatives include no blurred vision, chest pain, headaches or palpitations.  Arthritis Pertinent negatives include no dysuria, fever, rash or weight loss.   Here for f/u of HBP, COPD, arthritis.  Goes to Dr. Raul Del for lungs.  Goes to pain clinic for her arthritis pain.  Go[facility] to [facility] follow [facility]  No problem-specific assessment & plan notes found for this encounter.   Past Medical History  Diagnosis Date  . Breast cancer ([place]) 2014    radiation and taking exemestane  . Cancer of left breast ([place]) 10/27/2014  . Allergy     Past Surgical History  Procedure Laterality Date  . Breast surgery      Family History  Problem Relation Age of Onset  . Breast cancer Mother 85  . Breast cancer Sister 40  . Kidney disease Father     Social History   Social History  . Marital Status: Widowed    Spouse Name: N/A  . Number of Children: N/A  . Years of Education: N/A   Occupational History  . Not on file.   Social History Main Topics  . Smoking status: Former Research scientist (life sciences)  . Smokeless tobacco: Never Used  . Alcohol Use: No  . Drug Use: No  . Sexual Activity: Not on file   Other Topics Concern  . Not on file   Social History Narrative     Current outpatient prescriptions:  .  acetaminophen (TYLENOL) 500 MG tablet, Take by mouth as needed. Reported on 05/04/2015, Disp: , Rfl:  .  albuterol (PROAIR HFA) 108 (90 BASE) MCG/ACT inhaler, Inhale into the lungs., Disp: , Rfl:  .  amLODipine (NORVASC) 5 MG tablet, TAKE ONE (1) TABLET BY MOUTH EVERY DAY, Disp: 90 tablet, Rfl: 0 .  Ascorbic Acid (VITAMIN C) 100 MG tablet, Take 100 mg by mouth  daily., Disp: , Rfl:  .  aspirin 500 MG tablet, Take 1,000 mg by mouth every 6 (six) hours as needed for pain. Reported on 05/11/2015, Disp: , Rfl:  .  calcium gluconate 650 MG tablet, Take 650 mg by mouth daily., Disp: , Rfl:  .  exemestane (AROMASIN) 25 MG tablet, Take by mouth., Disp: , Rfl:  .  exemestane (AROMASIN) 25 MG tablet, TAKE ONE (1) TABLET BY MOUTH EVERY DAY, Disp: 90 tablet, Rfl: 1 .  fexofenadine (ALLEGRA ALLERGY) 180 MG tablet, Take by mouth as needed. , Disp: , Rfl:  .  fluticasone (FLONASE) 50 MCG/ACT nasal spray, Place into both nostrils as needed. , Disp: , Rfl:  .  fluticasone (FLOVENT DISKUS) 50 MCG/BLIST diskus inhaler, Place into the nose as needed. , Disp: , Rfl:  .  losartan (COZAAR) 25 MG tablet, TAKE ONE (1) TABLET BY MOUTH EVERY DAY, Disp: 30 tablet, Rfl: 1 .  Multiple Minerals-Vitamins (CALCIUM & VIT D3 BONE HEALTH PO), Take 5,000 Units by mouth daily. Reported on 04/29/2015, Disp: , Rfl:  .  naproxen sodium (ANAPROX) 220 MG tablet, Take 220 mg by mouth 2 (two) times daily with a meal. Reported on 05/04/2015, Disp: , Rfl:  .  NEXIUM 20 MG capsule, , Disp: ,  Rfl:  .  tiotropium (SPIRIVA HANDIHALER) 18 MCG inhalation capsule, Place into inhaler and inhale., Disp: , Rfl:   Current facility-administered medications:  .  bupivacaine (PF) (MARCAINE) 0.25 % injection 30 mL, 30 mL, Other, Once, Mohammed Kindle, MD .  orphenadrine (NORFLEX) injection 60 mg, 60 mg, Intramuscular, Once, Mohammed Kindle, MD  Allergies  Allergen Reactions  . Penicillins Other (See Comments)  . Statins     Not sure what exact med was; just made her feel bad (can't elaborate)  . Penicillin G Rash  . Sulfa Antibiotics Other (See Comments) and Rash     Review of Systems  Constitutional: Positive for malaise/fatigue. Negative for fever, chills and weight loss.  HENT: Negative for hearing loss.   Eyes: Negative for blurred vision and double vision.  Respiratory: Positive for shortness of breath  and wheezing. Negative for cough and sputum production.   Cardiovascular: Negative for chest pain, palpitations and leg swelling.  Gastrointestinal: Negative for heartburn, abdominal pain, constipation and blood in stool.  Genitourinary: Negative for dysuria, urgency and frequency.  Musculoskeletal: Positive for myalgias (diffuse stiffness), joint pain (diffuse) and arthritis.  Skin: Negative for itching and rash.  Neurological: Negative for dizziness, tremors, weakness and headaches.  Psychiatric/Behavioral: Negative for depression. The patient is not nervous/anxious and does not have insomnia.       Objective  Filed Vitals:   06/04/15 0937  BP: 122/82  Pulse: 72  Resp: 16  Height: 5' 5.5" (1.664 m)  Weight: 221 lb (100.245 kg)  SpO2: 97%    Physical Exam  Constitutional: She is oriented to person, place, and time and well-developed, well-nourished, and in no distress. No distress.  HENT:  Head: Normocephalic and atraumatic.  Eyes: Conjunctivae and EOM are normal. Pupils are equal, round, and reactive to light. No scleral icterus.  Neck: Normal range of motion. Neck supple. Carotid bruit is not present. No thyromegaly present.  Cardiovascular: Normal rate, regular rhythm and normal heart sounds.  Exam reveals no gallop and no friction rub.   No murmur heard. Pulmonary/Chest: Effort normal and breath sounds normal. No respiratory distress. She has no wheezes. She has no rales.  Abdominal: Soft. Bowel sounds are normal. She exhibits no distension, no abdominal bruit and no mass. There is no tenderness.  Musculoskeletal: She exhibits edema (trace edema in lower etremities bilaterally).  Lymphadenopathy:    She has no cervical adenopathy.  Neurological: She is alert and oriented to person, place, and time.  Vitals reviewed.      Recent Results (from the past 2160 hour(s))  CBC with Differential     Status: None   Collection Time: 04/29/15 10:05 AM  Result Value Ref Range    WBC 7.5 3.6 - 11.0 K/uL   RBC 4.85 3.80 - 5.20 MIL/uL   Hemoglobin 14.5 12.0 - 16.0 g/dL   HCT 43.0 35.0 - 47.0 %   MCV 88.6 80.0 - 100.0 fL   MCH 29.9 26.0 - 34.0 pg   MCHC 33.8 32.0 - 36.0 g/dL   RDW 14.0 11.5 - 14.5 %   Platelets 267 150 - 440 K/uL   Neutrophils Relative % 65 %   Neutro Abs 4.9 1.4 - 6.5 K/uL   Lymphocytes Relative 21 %   Lymphs Abs 1.6 1.0 - 3.6 K/uL   Monocytes Relative 10 %   Monocytes Absolute 0.8 0.2 - 0.9 K/uL   Eosinophils Relative 3 %   Eosinophils Absolute 0.2 0 - 0.7 K/uL   Basophils Relative  1 %   Basophils Absolute 0.1 0 - 0.1 K/uL  Comprehensive metabolic panel     Status: Abnormal   Collection Time: 04/29/15 10:05 AM  Result Value Ref Range   Sodium 136 135 - 145 mmol/L   Potassium 3.4 (L) 3.5 - 5.1 mmol/L   Chloride 99 (L) 101 - 111 mmol/L   CO2 29 22 - 32 mmol/L   Glucose, Bld 99 65 - 99 mg/dL   BUN 14 6 - 20 mg/dL   Creatinine, Ser 0.76 0.44 - 1.00 mg/dL   Calcium 9.3 8.9 - 10.3 mg/dL   Total Protein 7.3 6.5 - 8.1 g/dL   Albumin 4.2 3.5 - 5.0 g/dL   AST 17 15 - 41 U/L   ALT 17 14 - 54 U/L   Alkaline Phosphatase 104 38 - 126 U/L   Total Bilirubin 0.4 0.3 - 1.2 mg/dL   GFR calc non Af Amer >60 >60 mL/min   GFR calc Af Amer >60 >60 mL/min    Comment: (NOTE) The eGFR has been calculated using the CKD EPI equation. This calculation has not been validated in all clinical situations. eGFR's persistently <60 mL/min signify possible Chronic Kidney Disease.    Anion gap 8 5 - 15     Assessment & Plan  Problem List Items Addressed This Visit      Cardiovascular and Mediastinum   Essential (primary) hypertension - Primary   Relevant Orders   Comprehensive Metabolic Panel (CMET)     Respiratory   COPD, mild (HCC)   Allergic rhinitis, seasonal     Digestive   Gastro-esophageal reflux disease without esophagitis     Musculoskeletal and Integument   Arthritis     Other   Cancer of left breast (HCC)   Vitamin D deficiency    Relevant Orders   Vitamin D 1,25 dihydroxy   Vitamin D 25 hydroxy   Clinical depression   Elevated lipids   Relevant Orders   Lipid Profile      No orders of the defined types were placed in this encounter.   1. Essential (primary) hypertension  - Comprehensive Metabolic Panel (CMET)  2. COPD, mild ([place])   3. Allergic rhinitis, seasonal   4. Gastro-esophageal reflux disease without esophagitis   5. Arthritis   6. Malignant neoplasm of upper-outer quadrant of left female breast ([place])   7. Clinical depression   8. Elevated lipids  - Lipid Profile  9. Vitamin D deficiency  - Vitamin D 1,25 dihydroxy - Vitamin D 25 hydroxy  Continue all current meds at current doses.

## 2015-06-09 ENCOUNTER — Encounter: Payer: Self-pay | Admitting: Pain Medicine

## 2015-06-09 ENCOUNTER — Ambulatory Visit: Payer: Medicare Other | Attending: Pain Medicine | Admitting: Pain Medicine

## 2015-06-09 VITALS — BP 160/67 | HR 91 | Temp 98.0°F | Resp 18 | Ht 65.5 in | Wt 215.0 lb

## 2015-06-09 DIAGNOSIS — M533 Sacrococcygeal disorders, not elsewhere classified: Secondary | ICD-10-CM | POA: Insufficient documentation

## 2015-06-09 DIAGNOSIS — M160 Bilateral primary osteoarthritis of hip: Secondary | ICD-10-CM | POA: Insufficient documentation

## 2015-06-09 DIAGNOSIS — M19012 Primary osteoarthritis, left shoulder: Secondary | ICD-10-CM | POA: Diagnosis not present

## 2015-06-09 DIAGNOSIS — M47816 Spondylosis without myelopathy or radiculopathy, lumbar region: Secondary | ICD-10-CM | POA: Diagnosis not present

## 2015-06-09 DIAGNOSIS — M47817 Spondylosis without myelopathy or radiculopathy, lumbosacral region: Secondary | ICD-10-CM | POA: Diagnosis not present

## 2015-06-09 DIAGNOSIS — M5416 Radiculopathy, lumbar region: Secondary | ICD-10-CM | POA: Diagnosis not present

## 2015-06-09 DIAGNOSIS — M170 Bilateral primary osteoarthritis of knee: Secondary | ICD-10-CM | POA: Insufficient documentation

## 2015-06-09 DIAGNOSIS — C50911 Malignant neoplasm of unspecified site of right female breast: Secondary | ICD-10-CM | POA: Insufficient documentation

## 2015-06-09 DIAGNOSIS — M791 Myalgia: Secondary | ICD-10-CM | POA: Diagnosis not present

## 2015-06-09 DIAGNOSIS — C50912 Malignant neoplasm of unspecified site of left female breast: Secondary | ICD-10-CM | POA: Diagnosis not present

## 2015-06-09 DIAGNOSIS — M5136 Other intervertebral disc degeneration, lumbar region: Secondary | ICD-10-CM | POA: Diagnosis not present

## 2015-06-09 DIAGNOSIS — M19011 Primary osteoarthritis, right shoulder: Secondary | ICD-10-CM | POA: Diagnosis not present

## 2015-06-09 DIAGNOSIS — M79606 Pain in leg, unspecified: Secondary | ICD-10-CM | POA: Diagnosis present

## 2015-06-09 DIAGNOSIS — M546 Pain in thoracic spine: Secondary | ICD-10-CM | POA: Diagnosis present

## 2015-06-09 MED ORDER — TRAMADOL HCL 50 MG PO TABS: ORAL_TABLET | ORAL | Status: DC

## 2015-06-09 NOTE — Patient Instructions (Addendum)
PLAN   Continue present medication and begin tramadol. Caution, and all can cause respiratory depression, excessive sedation, confusion, and other side effects  F/U PCP  Dr. Luan Pulling for evaliation of  BP and general medical  condition  F/U surgical evaluation. May consider pending follow-up evaluations  F/U neurological evaluation. May consider pending follow-up evaluations  May consider radiofrequency rhizolysis or intraspinal procedures pending response to present treatment and F/U evaluation   Patient to call [facility] should patient have concerns prior to scheduled return appointment

## 2015-06-09 NOTE — Progress Notes (Signed)
   Subjective:    Patient ID: Candace Tapia, female    DOB: [birth year], 80 y.o.   MRN: [id number]  HPI  The patient is a 80 year old female who returns to pain management for further evaluation and treatment of pain involving the back and lower extremity region. The patient states she is able to stand longer and able to perform activities of daily living prolong appears at times that she underwent block of nerves to the sacroiliac joint. The patient states that her pain has been very minimal since undergoing the procedure. The patient denies any trauma change in events of daily living the call significant change in symptomatology. We discussed patient's condition on today's visit. The patient agreed to begin tramadol. We will observe patient's posture, and I'll and will avoid interventional treatment at this time. We will consider additional modifications of treatment regimen pending response to, and all and pending follow-up evaluation. The patient agreed to suggested treatment plan.  Review of Systems     Objective:   Physical Exam There was tenderness to palpation of paraspinal muscular region cervical region cervical facet region palpation which reproduces minimal discomfort. There was minimal tenderness of the acromioclavicular and glenohumeral joint region and minimal tenderness over the region of the splenius capitis and occipitalis musculature regions. The patient appeared to be with unremarkable Spurling's maneuver and appeared to be with bilaterally equal grip strength with Tinel and Phalen's maneuver reproducing minimal discomfort. Palpation over the thoracic region thoracic facet region was attends to palpation of minimal degree with minimal tenderness over the upper and mid thoracic regions with moderate tenderness to palpation of the lower thoracic paraspinal musculature region with no crepitus of the thoracic region noted. Palpation over the lumbar paraspinal musculatures and lumbar  facet region was attends to palpation of moderate degree with lateral bending rotation extension and palpation of the lumbar facets reproducing moderate discomfort. There was mild tenderness over the PSIS and PII S region as well as the greater trochanteric region and iliotibial band region. Straight leg raise was tolerated to 30 without an increase of pain with dorsiflexion noted. EHL strength appeared to be decreased and there was negative clonus negative Homans. Warmth and erythema in the region of the knees. Palpation of the gluteal and piriformis musculature regions reproduces minimal discomfort as well. There was negative clonus negative Homans. Abdomen was without excessive tends to palpation and no costovertebral angle tenderness was noted.       Assessment & Plan:      Degenerative disc disease of the lumbar spine Multilevel degenerative changes lumbar spine with  Facet degenerative changes at multiple levels and Spurling  Facet syndrome  Sacroiliac joint dysfunction  Degenerative joint disease of knees  Degenerative joint disease of shoulders (status post surgery of left and right shoulders)  Degenerative joint disease of hips  Carcinoma of the breasts     PLAN   Continue present medication and begin tramadol. Caution, and all can cause respiratory depression, excessive sedation, confusion, and other side effects  F/U PCP  Dr. Luan Pulling for evaliation of  BP and general medical  condition  F/U surgical evaluation. May consider pending follow-up evaluations  F/U neurological evaluation. May consider pending follow-up evaluations  May consider radiofrequency rhizolysis or intraspinal procedures pending response to present treatment and F/U evaluation   Patient to call [facility] should patient have concerns prior to scheduled return appointment

## 2015-06-12 DIAGNOSIS — E785 Hyperlipidemia, unspecified: Secondary | ICD-10-CM | POA: Diagnosis not present

## 2015-06-12 DIAGNOSIS — E559 Vitamin D deficiency, unspecified: Secondary | ICD-10-CM | POA: Diagnosis not present

## 2015-06-12 DIAGNOSIS — I10 Essential (primary) hypertension: Secondary | ICD-10-CM | POA: Diagnosis not present

## 2015-06-16 LAB — COMPREHENSIVE METABOLIC PANEL
ALK PHOS: 103 IU/L (ref 39–117)
ALT: 19 IU/L (ref 0–32)
AST: 18 IU/L (ref 0–40)
Albumin/Globulin Ratio: 2.1 (ref 1.1–2.5)
Albumin: 4.5 g/dL (ref 3.5–4.8)
BUN/Creatinine Ratio: 20 (ref 11–26)
BUN: 16 mg/dL (ref 8–27)
Bilirubin Total: 0.5 mg/dL (ref 0.0–1.2)
CALCIUM: 9.6 mg/dL (ref 8.7–10.3)
CHLORIDE: 94 mmol/L — AB (ref 96–106)
CO2: 24 mmol/L (ref 18–29)
Creatinine, Ser: 0.8 mg/dL (ref 0.57–1.00)
GFR calc Af Amer: 82 mL/min/{1.73_m2} (ref >59)
GFR, EST NON AFRICAN AMERICAN: 71 mL/min/{1.73_m2} (ref >59)
Globulin, Total: 2.1 g/dL (ref 1.5–4.5)
Glucose: 99 mg/dL (ref 65–99)
POTASSIUM: 3.9 mmol/L (ref 3.5–5.2)
Sodium: 137 mmol/L (ref 134–144)
Total Protein: 6.6 g/dL (ref 6.0–8.5)

## 2015-06-16 LAB — VITAMIN D 1,25 DIHYDROXY
VITAMIN D3 1, 25 (OH): 34 pg/mL
Vitamin D 1, 25 (OH)2 Total: 39 pg/mL
Vitamin D2 1, 25 (OH)2: <10 pg/mL

## 2015-06-16 LAB — LIPID PANEL
CHOL/HDL RATIO: 5 ratio — AB (ref 0.0–4.4)
CHOLESTEROL TOTAL: 233 mg/dL — AB (ref 100–199)
HDL: 47 mg/dL (ref >39)
LDL Calculated: 154 mg/dL — ABNORMAL HIGH (ref 0–99)
TRIGLYCERIDES: 159 mg/dL — AB (ref 0–149)
VLDL Cholesterol Cal: 32 mg/dL (ref 5–40)

## 2015-06-16 LAB — VITAMIN D 25 HYDROXY (VIT D DEFICIENCY, FRACTURES): VIT D 25 HYDROXY: 37.1 ng/mL (ref 30.0–100.0)

## 2015-06-21 DIAGNOSIS — J449 Chronic obstructive pulmonary disease, unspecified: Secondary | ICD-10-CM | POA: Diagnosis not present

## 2015-07-07 ENCOUNTER — Encounter: Payer: Self-pay | Admitting: Pain Medicine

## 2015-07-07 ENCOUNTER — Ambulatory Visit: Payer: Medicare Other

## 2015-07-07 ENCOUNTER — Ambulatory Visit: Payer: Medicare Other | Attending: Pain Medicine | Admitting: Pain Medicine

## 2015-07-07 VITALS — BP 184/102 | HR 105 | Temp 97.7°F | Resp 16 | Ht 65.5 in | Wt 215.0 lb

## 2015-07-07 VITALS — BP 150/90 | HR 106

## 2015-07-07 DIAGNOSIS — M533 Sacrococcygeal disorders, not elsewhere classified: Secondary | ICD-10-CM | POA: Diagnosis not present

## 2015-07-07 DIAGNOSIS — Z9889 Other specified postprocedural states: Secondary | ICD-10-CM | POA: Insufficient documentation

## 2015-07-07 DIAGNOSIS — I10 Essential (primary) hypertension: Secondary | ICD-10-CM

## 2015-07-07 DIAGNOSIS — M5136 Other intervertebral disc degeneration, lumbar region: Secondary | ICD-10-CM | POA: Insufficient documentation

## 2015-07-07 DIAGNOSIS — M170 Bilateral primary osteoarthritis of knee: Secondary | ICD-10-CM | POA: Diagnosis not present

## 2015-07-07 DIAGNOSIS — M19012 Primary osteoarthritis, left shoulder: Secondary | ICD-10-CM | POA: Insufficient documentation

## 2015-07-07 DIAGNOSIS — M791 Myalgia: Secondary | ICD-10-CM | POA: Diagnosis not present

## 2015-07-07 DIAGNOSIS — M160 Bilateral primary osteoarthritis of hip: Secondary | ICD-10-CM | POA: Diagnosis not present

## 2015-07-07 DIAGNOSIS — M79606 Pain in leg, unspecified: Secondary | ICD-10-CM | POA: Diagnosis present

## 2015-07-07 DIAGNOSIS — M47816 Spondylosis without myelopathy or radiculopathy, lumbar region: Secondary | ICD-10-CM | POA: Diagnosis not present

## 2015-07-07 DIAGNOSIS — Z853 Personal history of malignant neoplasm of breast: Secondary | ICD-10-CM | POA: Diagnosis not present

## 2015-07-07 DIAGNOSIS — M5416 Radiculopathy, lumbar region: Secondary | ICD-10-CM | POA: Diagnosis not present

## 2015-07-07 DIAGNOSIS — M19011 Primary osteoarthritis, right shoulder: Secondary | ICD-10-CM | POA: Insufficient documentation

## 2015-07-07 DIAGNOSIS — M47817 Spondylosis without myelopathy or radiculopathy, lumbosacral region: Secondary | ICD-10-CM | POA: Diagnosis not present

## 2015-07-07 DIAGNOSIS — M545 Low back pain: Secondary | ICD-10-CM | POA: Diagnosis present

## 2015-07-07 MED ORDER — TRAMADOL HCL 50 MG PO TABS: ORAL_TABLET | ORAL | Status: DC

## 2015-07-07 NOTE — Patient Instructions (Signed)
PLAN   Continue present medication  tramadol. Caution, and all can cause respiratory depression, excessive sedation, confusion, and other side effects  F/U PCP  Dr. Luan Pulling for evaliation of  BP and general medical  condition. Your blood pressure is elevated as we discussed please see Dr. Luan Pulling today or this week for evaluation of your elevated blood pressure  F/U surgical evaluation. May consider pending follow-up evaluations  F/U neurological evaluation. May consider pending follow-up evaluations  May consider radiofrequency rhizolysis or intraspinal procedures pending response to present treatment and F/U evaluation   Patient to call [facility] Management Center should patient have concerns prior to scheduled return appointment

## 2015-07-07 NOTE — Progress Notes (Signed)
Safety precautions to be maintained throughout the outpatient stay will include: orient to surroundings, keep bed in low position, maintain call bell within reach at all times, provide assistance with transfer out of bed and ambulation.  

## 2015-07-07 NOTE — Progress Notes (Signed)
Subjective:    Patient ID: Candace Tapia, female    DOB: 06-27-1935, 80 y.o.   MRN: [id number]  HPI  The patient is a 80 year old female who returns to pain management for further evaluation and treatment of pain involving the lower back and lower extremity region predominantly. The patient also has pain involving the region of the left shoulder and is with decreased range of motion of the left shoulder. The patient states that she has had improvement of lower back and lower extremity pain since she has undergone interventional treatment in pain management Center and is receiving tramadol. We discussed patient's condition and will avoid additional interventional treatment at this time. We will continue patient's tramadol as prescribed. We also advised patient to follow-up with Dr. Luan Pulling regarding blood pressure which was elevated on today's visit. The patient will follow-up with Dr. Luan Pulling we will continue medications as prescribed. Patient is to call pain management prior to scheduled return appointment should they be change in condition or should patient have other concerns regarding condition prior to scheduled return appointment. The patient is able to perform activities of daily living as well as obtaining restful sleep at this time without significant pain interfering with activities. With suggested treatment plan.     Review of Systems     Objective:   Physical Exam  There was tenderness to palpation of the splenius capitis and occipitalis muscle region a mild degree. There was mild tenderness over the cervical facet cervical paraspinal musculature regions. Palpation of the acromioclavicular and glenohumeral joint regions reproduced moderate discomfort on the left. There was limited range of motion of the left shoulder. The patient had difficulty performing the drop test. There was questionably decreased grip strength with Tinel and Phalen's maneuver reproducing minimal discomfort.  Patient appeared to be with bilaterally equal grip strength. Palpation of the thoracic facet thoracic paraspinal must reason was with minimal discomfort. Palpation over the lumbar paraspinal must reason lumbar facet region was with moderate discomfort with palpation and rotation reproducing more significant discomfort. There was mild to moderate tenderness of the PSIS and PII S region. There was minimal tenderness of the greater trochanteric region and iliotibial band region. Straight leg raising was tolerates approximately 20 without increase of pain with dorsiflexion noted. There was no definite sensory deficit or dermatomal distribution detected. EHL strength appeared to be decreased. There was negative clonus negative Homans. Abdomen was nontender with no costovertebral angle tenderness noted.      Assessment & Plan:   Degenerative disc disease of the lumbar spine Multilevel degenerative changes lumbar spine with  Facet degenerative changes at multiple levels and Spurling  Facet syndrome  Sacroiliac joint dysfunction  Degenerative joint disease of knees  Degenerative joint disease of shoulders (status post surgery of left and right shoulders)  Degenerative joint disease of hips  History of carcinoma of the breast     PLAN   Continue present medication  tramadol. Caution, and all can cause respiratory depression, excessive sedation, confusion, and other side effects  F/U PCP  Dr. Luan Pulling for evaliation of  BP and general medical  condition. Your blood pressure is elevated as we discussed please see Dr. Luan Pulling today or this week for evaluation of your elevated blood pressure  F/U surgical evaluation. May consider pending follow-up evaluations  F/U neurological evaluation. May consider pending follow-up evaluations  May consider radiofrequency rhizolysis or intraspinal procedures pending response to present treatment and F/U evaluation   Patient to call [facility] Management Center  should patient have concerns prior to scheduled return appointment

## 2015-07-07 NOTE — Progress Notes (Signed)
As per Dr. Luan Pulling reading from today is 150/90 which is not dangerously high and she can be seen on Thursday at 8:30 am for blood pressure follow up pt understood well.

## 2015-07-09 ENCOUNTER — Encounter: Payer: Self-pay | Admitting: Family Medicine

## 2015-07-09 ENCOUNTER — Ambulatory Visit (INDEPENDENT_AMBULATORY_CARE_PROVIDER_SITE_OTHER): Payer: Medicare Other | Admitting: Family Medicine

## 2015-07-09 VITALS — BP 155/80 | HR 80 | Temp 98.0°F | Resp 16 | Ht 65.0 in | Wt 222.0 lb

## 2015-07-09 DIAGNOSIS — J449 Chronic obstructive pulmonary disease, unspecified: Secondary | ICD-10-CM

## 2015-07-09 DIAGNOSIS — G8929 Other chronic pain: Secondary | ICD-10-CM

## 2015-07-09 DIAGNOSIS — I10 Essential (primary) hypertension: Secondary | ICD-10-CM | POA: Diagnosis not present

## 2015-07-09 DIAGNOSIS — K219 Gastro-esophageal reflux disease without esophagitis: Secondary | ICD-10-CM | POA: Diagnosis not present

## 2015-07-09 MED ORDER — NEXIUM 20 MG PO CPDR: 20.0000 mg | DELAYED_RELEASE_CAPSULE | Freq: Every day | ORAL | Status: DC

## 2015-07-09 MED ORDER — LOSARTAN POTASSIUM 50 MG PO TABS: 50.0000 mg | ORAL_TABLET | Freq: Every day | ORAL | Status: DC

## 2015-07-09 MED ORDER — AMLODIPINE BESYLATE 5 MG PO TABS: 5.0000 mg | ORAL_TABLET | Freq: Every day | ORAL | Status: DC

## 2015-07-09 NOTE — Progress Notes (Signed)
Name: Candace Tapia   MRN: [id number]    DOB: December 23, 1935   Date:07/09/2015       Progress Note  Subjective  Chief Complaint  Chief Complaint  Patient presents with  . Hypertension    HPI Here for recheck of BP.  BP elevated to 180/110 (approx).  At [facility] 2 days ago.  When rechecked here an hour or so later was 150/90.  She is back to recheck.  She felt "bad" at Pain Management 2 days ago.  Just felt "tired".  Feels ok today. No problem-specific assessment & plan notes found for this encounter.   Past Medical History  Diagnosis Date  . Breast cancer ([place]) 2014    radiation and taking exemestane  . Cancer of left breast ([place]) 10/27/2014  . Allergy   . Arthritis   . COPD (chronic obstructive pulmonary disease) ([place])   . GERD (gastroesophageal reflux disease)   . Hypertension     Past Surgical History  Procedure Laterality Date  . Breast surgery    . Total shoulder replacement Bilateral 2010 and 2013  . Abdominal hysterectomy    . Joint replacement    . Lymph node dissection      neck    Family History  Problem Relation Age of Onset  . Breast cancer Mother 63  . Breast cancer Sister 36  . Kidney disease Father     Social History   Social History  . Marital Status: Widowed    Spouse Name: N/A  . Number of Children: N/A  . Years of Education: N/A   Occupational History  . Not on file.   Social History Main Topics  . Smoking status: Former Research scientist (life sciences)  . Smokeless tobacco: Never Used  . Alcohol Use: No  . Drug Use: No  . Sexual Activity: Not on file   Other Topics Concern  . Not on file   Social History Narrative     Current outpatient prescriptions:  .  albuterol (PROAIR HFA) 108 (90 BASE) MCG/ACT inhaler, Inhale into the lungs., Disp: , Rfl:  .  amLODipine (NORVASC) 5 MG tablet, Take 1 tablet (5 mg total) by mouth daily., Disp: 90 tablet, Rfl: 0 .  Ascorbic Acid (VITAMIN C) 100 MG tablet, Take 100 mg by mouth daily., Disp: , Rfl:  .   calcium gluconate 650 MG tablet, Take 650 mg by mouth daily., Disp: , Rfl:  .  exemestane (AROMASIN) 25 MG tablet, Take 25 mg by mouth daily after breakfast. , Disp: , Rfl:  .  fexofenadine (ALLEGRA ALLERGY) 180 MG tablet, Take by mouth as needed. , Disp: , Rfl:  .  fluticasone (FLONASE) 50 MCG/ACT nasal spray, Place into both nostrils as needed. , Disp: , Rfl:  .  fluticasone (FLOVENT DISKUS) 50 MCG/BLIST diskus inhaler, Place into the nose as needed. , Disp: , Rfl:  .  losartan (COZAAR) 50 MG tablet, Take 1 tablet (50 mg total) by mouth daily., Disp: 30 tablet, Rfl: 6 .  NEXIUM 20 MG capsule, Take 1 capsule (20 mg total) by mouth daily., Disp: 90 capsule, Rfl: 3 .  tiotropium (SPIRIVA HANDIHALER) 18 MCG inhalation capsule, Place 18 mcg into inhaler and inhale daily. , Disp: , Rfl:  .  traMADol (ULTRAM) 50 MG tablet, Limit one half to one tab by mouth per day or twice per day if tolerated, Disp: 60 tablet, Rfl: 0  Allergies  Allergen Reactions  . Penicillins Other (See Comments)  . Statins  Not sure what exact med was; just made her feel bad (can't elaborate)  . Penicillin G Rash  . Sulfa Antibiotics Other (See Comments) and Rash     Review of Systems  Constitutional: Positive for malaise/fatigue. Negative for fever, chills and weight loss.  HENT: Negative for hearing loss.   Eyes: Negative for blurred vision and double vision.  Respiratory: Negative for cough, shortness of breath and wheezing.   Cardiovascular: Negative for chest pain, palpitations and leg swelling.  Gastrointestinal: Negative for heartburn, abdominal pain and blood in stool.  Genitourinary: Negative for dysuria, urgency and frequency.  Musculoskeletal: Negative for myalgias and joint pain.  Skin: Negative for rash.  Neurological: Negative for dizziness, tremors, weakness and headaches.  Psychiatric/Behavioral: Negative for depression.      Objective  Filed Vitals:   07/09/15 0817 07/09/15 0840  BP:  176/77 155/80  Pulse: 80   Temp: 98 F (36.7 C)   Resp: 16   Height: '5\' 5"'$  (1.651 m)   Weight: 222 lb (100.699 kg)     Physical Exam  Constitutional: She is oriented to person, place, and time and well-developed, well-nourished, and in no distress. No distress.  HENT:  Head: Normocephalic and atraumatic.  Eyes: Conjunctivae and EOM are normal. Pupils are equal, round, and reactive to light. No scleral icterus.  Neck: Normal range of motion. Neck supple. Carotid bruit is not present. No thyromegaly present.  Cardiovascular: Normal rate, regular rhythm and normal heart sounds.  Exam reveals no gallop and no friction rub.   No murmur heard. Pulmonary/Chest: Effort normal and breath sounds normal. No respiratory distress. She has no wheezes. She has no rales.  Musculoskeletal: She exhibits edema (trace edema L lega nd ankle).  Lymphadenopathy:    She has no cervical adenopathy.  Neurological: She is alert and oriented to person, place, and time. No cranial nerve deficit. She exhibits normal muscle tone. Coordination normal.  Vitals reviewed.      Recent Results (from the past 2160 hour(s))  CBC with Differential     Status: None   Collection Time: 04/29/15 10:05 AM  Result Value Ref Range   WBC 7.5 3.6 - 11.0 K/uL   RBC 4.85 3.80 - 5.20 MIL/uL   Hemoglobin 14.5 12.0 - 16.0 g/dL   HCT 43.0 35.0 - 47.0 %   MCV 88.6 80.0 - 100.0 fL   MCH 29.9 26.0 - 34.0 pg   MCHC 33.8 32.0 - 36.0 g/dL   RDW 14.0 11.5 - 14.5 %   Platelets 267 150 - 440 K/uL   Neutrophils Relative % 65 %   Neutro Abs 4.9 1.4 - 6.5 K/uL   Lymphocytes Relative 21 %   Lymphs Abs 1.6 1.0 - 3.6 K/uL   Monocytes Relative 10 %   Monocytes Absolute 0.8 0.2 - 0.9 K/uL   Eosinophils Relative 3 %   Eosinophils Absolute 0.2 0 - 0.7 K/uL   Basophils Relative 1 %   Basophils Absolute 0.1 0 - 0.1 K/uL  Comprehensive metabolic panel     Status: Abnormal   Collection Time: 04/29/15 10:05 AM  Result Value Ref Range    Sodium 136 135 - 145 mmol/L   Potassium 3.4 (L) 3.5 - 5.1 mmol/L   Chloride 99 (L) 101 - 111 mmol/L   CO2 29 22 - 32 mmol/L   Glucose, Bld 99 65 - 99 mg/dL   BUN 14 6 - 20 mg/dL   Creatinine, Ser 0.76 0.44 - 1.00 mg/dL   Calcium  9.3 8.9 - 10.3 mg/dL   Total Protein 7.3 6.5 - 8.1 g/dL   Albumin 4.2 3.5 - 5.0 g/dL   AST 17 15 - 41 U/L   ALT 17 14 - 54 U/L   Alkaline Phosphatase 104 38 - 126 U/L   Total Bilirubin 0.4 0.3 - 1.2 mg/dL   GFR calc non Af Amer >60 >60 mL/min   GFR calc Af Amer >60 >60 mL/min    Comment: (NOTE) The eGFR has been calculated using the CKD EPI equation. This calculation has not been validated in all clinical situations. eGFR's persistently <60 mL/min signify possible Chronic Kidney Disease.    Anion gap 8 5 - 15  Comprehensive Metabolic Panel (CMET)     Status: Abnormal   Collection Time: 06/12/15  8:12 AM  Result Value Ref Range   Glucose 99 65 - 99 mg/dL   BUN 16 8 - 27 mg/dL   Creatinine, Ser 0.80 0.57 - 1.00 mg/dL   GFR calc non Af Amer 71 >59 mL/min/1.73   GFR calc Af Amer 82 >59 mL/min/1.73   BUN/Creatinine Ratio 20 11 - 26   Sodium 137 134 - 144 mmol/L   Potassium 3.9 3.5 - 5.2 mmol/L   Chloride 94 (L) 96 - 106 mmol/L   CO2 24 18 - 29 mmol/L   Calcium 9.6 8.7 - 10.3 mg/dL   Total Protein 6.6 6.0 - 8.5 g/dL   Albumin 4.5 3.5 - 4.8 g/dL   Globulin, Total 2.1 1.5 - 4.5 g/dL   Albumin/Globulin Ratio 2.1 1.1 - 2.5    Comment: **Effective June 29, 2015 the reference interval**   for A/G Ratio will be changing to:              Age                Female          Female           0 -  7 days       1.1 - 2.3       1.1 - 2.3           8 - 30 days       1.2 - 2.8       1.2 - 2.8           1 -  6 months     1.3 - 3.6       1.3 - 3.6    7 months -  5 years      1.5 - 2.6       1.5 - 2.6              > 5 years      1.2 - 2.2       1.2 - 2.2    Bilirubin Total 0.5 0.0 - 1.2 mg/dL   Alkaline Phosphatase 103 39 - 117 IU/L   AST 18 0 - 40 IU/L   ALT 19 0  - 32 IU/L  Lipid Profile     Status: Abnormal   Collection Time: 06/12/15  8:12 AM  Result Value Ref Range   Cholesterol, Total 233 (H) 100 - 199 mg/dL   Triglycerides 159 (H) 0 - 149 mg/dL   HDL 47 >39 mg/dL   VLDL Cholesterol Cal 32 5 - 40 mg/dL   LDL Calculated 154 (H) 0 - 99 mg/dL   Chol/HDL Ratio 5.0 (H) 0.0 - 4.4 ratio units  Comment:                                   T. Chol/HDL Ratio                                             Men  Women                               1/2 Avg.Risk  3.4    3.3                                   Avg.Risk  5.0    4.4                                2X Avg.Risk  9.6    7.1                                3X Avg.Risk 23.4   11.0   Vitamin D 1,25 dihydroxy     Status: None   Collection Time: 06/12/15  8:12 AM  Result Value Ref Range   Vitamin D 1, 25 (OH)2 Total 39 pg/mL    Comment: Reference Range: Adults: 21 - 65    Vitamin D2 1, 25 (OH)2 <10 pg/mL   Vitamin D3 1, 25 (OH)2 34 pg/mL  Vitamin D 25 hydroxy     Status: None   Collection Time: 06/12/15  8:12 AM  Result Value Ref Range   Vit D, 25-Hydroxy 37.1 30.0 - 100.0 ng/mL    Comment: Vitamin D deficiency has been defined by the [place] and an Endocrine Society practice guideline as a level of serum 25-OH vitamin D less than 20 ng/mL (1,2). The Endocrine Society went on to further define vitamin D insufficiency as a level between 21 and 29 ng/mL (2). 1. IOM (Institute of Medicine). 2010. Dietary reference    intakes for calcium and D. [place]: The    [employer]. 2. Holick MF, Binkley [place], Bischoff-Ferrari HA, et al.    Evaluation, treatment, and prevention of vitamin D    deficiency: an Endocrine Society clinical practice    guideline. JCEM. 2011 Jul; 96(7):1911-30.      Assessment & Plan  Problem List Items Addressed This Visit      Cardiovascular and Mediastinum   Essential (primary) hypertension - Primary   Relevant Medications   losartan  (COZAAR) 50 MG tablet   amLODipine (NORVASC) 5 MG tablet     Respiratory   Mild chronic obstructive pulmonary disease (HCC)     Digestive   Gastro-esophageal reflux disease without esophagitis   Relevant Medications   NEXIUM 20 MG capsule     Other   Chronic pain      Meds ordered this encounter  Medications  . losartan (COZAAR) 50 MG tablet    Sig: Take 1 tablet (50 mg total) by mouth daily.    Dispense:  30 tablet    Refill:  6    PATIENT'S INSURANCE IS REQUESTING TRANSITION TO 90  DAY SSUPPLY. IF OKAY, PLEASE SUBMIT NEW RX. THANKS!  . NEXIUM 20 MG capsule    Sig: Take 1 capsule (20 mg total) by mouth daily.    Dispense:  90 capsule    Refill:  3  . amLODipine (NORVASC) 5 MG tablet    Sig: Take 1 tablet (5 mg total) by mouth daily.    Dispense:  90 tablet    Refill:  0   1. Essential (primary) hypertension  - losartan (COZAAR) 50 MG tablet; Take 1 tablet (50 mg total) by mouth daily.  Dispense: 30 tablet; Refill: 6- increased from 25 mg/d. - amLODipine (NORVASC) 5 MG tablet; Take 1 tablet (5 mg total) by mouth daily.  Dispense: 90 tablet; Refill: 0 RTC 2 weeks 2. Mild chronic obstructive pulmonary disease ([place])   3. Chronic pain   4. Gastro-esophageal reflux disease without esophagitis  - NEXIUM 20 MG capsule; Take 1 capsule (20 mg total) by mouth daily.  Dispense: 90 capsule; Refill: 3

## 2015-07-14 ENCOUNTER — Other Ambulatory Visit: Payer: Self-pay | Admitting: Pain Medicine

## 2015-07-21 ENCOUNTER — Encounter: Payer: Self-pay | Admitting: Family Medicine

## 2015-07-21 ENCOUNTER — Ambulatory Visit (INDEPENDENT_AMBULATORY_CARE_PROVIDER_SITE_OTHER): Payer: Medicare Other | Admitting: Family Medicine

## 2015-07-21 VITALS — BP 160/80 | HR 79 | Temp 98.2°F | Resp 16 | Ht 65.5 in | Wt 218.6 lb

## 2015-07-21 DIAGNOSIS — I10 Essential (primary) hypertension: Secondary | ICD-10-CM | POA: Diagnosis not present

## 2015-07-21 DIAGNOSIS — J449 Chronic obstructive pulmonary disease, unspecified: Secondary | ICD-10-CM | POA: Diagnosis not present

## 2015-07-21 DIAGNOSIS — J432 Centrilobular emphysema: Secondary | ICD-10-CM | POA: Insufficient documentation

## 2015-07-21 MED ORDER — LOSARTAN POTASSIUM 100 MG PO TABS: 100.0000 mg | ORAL_TABLET | Freq: Every day | ORAL | Status: DC

## 2015-07-21 MED ORDER — AMLODIPINE BESYLATE 10 MG PO TABS: 10.0000 mg | ORAL_TABLET | Freq: Every day | ORAL | Status: DC

## 2015-07-21 NOTE — Progress Notes (Signed)
Name: Candace Tapia   MRN: [id number]    DOB: 1935/05/26   Date:07/21/2015       Progress Note  Subjective  Chief Complaint  Chief Complaint  Patient presents with  . Hypertension    Follow up    HPI Here for f/u of HBP.  C/o arthritic pain diffusely, esp in back and hips, but also in fingers/hands.  No problem-specific assessment & plan notes found for this encounter.   Past Medical History  Diagnosis Date  . Breast cancer ([place]) 2014    radiation and taking exemestane  . Cancer of left breast ([place]) 10/27/2014  . Allergy   . Arthritis   . COPD (chronic obstructive pulmonary disease) ([place])   . GERD (gastroesophageal reflux disease)   . Hypertension     Past Surgical History  Procedure Laterality Date  . Breast surgery    . Total shoulder replacement Bilateral 2010 and 2013  . Abdominal hysterectomy    . Joint replacement    . Lymph node dissection      neck    Family History  Problem Relation Age of Onset  . Breast cancer Mother 42  . Breast cancer Sister 56  . Kidney disease Father     Social History   Social History  . Marital Status: Widowed    Spouse Name: N/A  . Number of Children: N/A  . Years of Education: N/A   Occupational History  . Not on file.   Social History Main Topics  . Smoking status: Former Research scientist (life sciences)  . Smokeless tobacco: Never Used  . Alcohol Use: No  . Drug Use: No  . Sexual Activity: Not on file   Other Topics Concern  . Not on file   Social History Narrative     Current outpatient prescriptions:  .  albuterol (PROAIR HFA) 108 (90 BASE) MCG/ACT inhaler, Inhale into the lungs., Disp: , Rfl:  .  amLODipine (NORVASC) 10 MG tablet, Take 1 tablet (10 mg total) by mouth daily., Disp: 90 tablet, Rfl: 3 .  Ascorbic Acid (VITAMIN C) 100 MG tablet, Take 100 mg by mouth daily., Disp: , Rfl:  .  calcium gluconate 650 MG tablet, Take 650 mg by mouth daily., Disp: , Rfl:  .  exemestane (AROMASIN) 25 MG tablet, Take 25 mg by mouth daily  after breakfast. , Disp: , Rfl:  .  fexofenadine (ALLEGRA ALLERGY) 180 MG tablet, Take by mouth as needed. , Disp: , Rfl:  .  fluticasone (FLONASE) 50 MCG/ACT nasal spray, Place into both nostrils as needed. , Disp: , Rfl:  .  fluticasone (FLOVENT DISKUS) 50 MCG/BLIST diskus inhaler, Place into the nose as needed. , Disp: , Rfl:  .  losartan (COZAAR) 100 MG tablet, Take 1 tablet (100 mg total) by mouth daily., Disp: 90 tablet, Rfl: 3 .  NEXIUM 20 MG capsule, Take 1 capsule (20 mg total) by mouth daily., Disp: 90 capsule, Rfl: 3 .  tiotropium (SPIRIVA HANDIHALER) 18 MCG inhalation capsule, Place 18 mcg into inhaler and inhale daily. , Disp: , Rfl:  .  traMADol (ULTRAM) 50 MG tablet, Limit one half to one tab by mouth per day or twice per day if tolerated, Disp: 60 tablet, Rfl: 0  Allergies  Allergen Reactions  . Penicillins Other (See Comments)  . Statins     Not sure what exact med was; just made her feel bad (can't elaborate)  . Penicillin G Rash  . Sulfa Antibiotics Other (See Comments) and Rash  Review of Systems  Constitutional: Positive for malaise/fatigue. Negative for fever, chills and weight loss.  HENT: Negative for hearing loss.   Eyes: Negative for blurred vision and double vision.  Respiratory: Negative for cough, shortness of breath and wheezing.   Cardiovascular: Negative for chest pain, palpitations and leg swelling.  Gastrointestinal: Negative for heartburn, abdominal pain and blood in stool.  Genitourinary: Negative for dysuria, urgency and frequency.  Musculoskeletal: Positive for joint pain. Negative for myalgias. Back pain: diffuse.  Skin: Negative for rash.  Neurological: Negative for tremors, weakness and headaches.      Objective  Filed Vitals:   07/21/15 1338 07/21/15 1406  BP: 163/87 160/80  Pulse: 79   Temp: 98.2 F (36.8 C)   TempSrc: Oral   Resp: 16   Height: 5' 5.5" (1.664 m)   Weight: 218 lb 9.6 oz (99.156 kg)   SpO2: 96%     Physical  Exam  Constitutional: She is oriented to person, place, and time and well-developed, well-nourished, and in no distress. No distress.  HENT:  Head: Normocephalic and atraumatic.  Eyes: Conjunctivae and EOM are normal. Pupils are equal, round, and reactive to light. No scleral icterus.  Neck: Normal range of motion. Neck supple. No thyromegaly present.  Cardiovascular: Normal rate, regular rhythm and normal heart sounds.  Exam reveals no gallop and no friction rub.   No murmur heard. Pulmonary/Chest: Effort normal and breath sounds normal. No respiratory distress. She has no wheezes. She has no rales.  Musculoskeletal: She exhibits no edema.  Lymphadenopathy:    She has no cervical adenopathy.  Neurological: She is alert and oriented to person, place, and time.  Vitals reviewed.      Recent Results (from the past 2160 hour(s))  CBC with Differential     Status: None   Collection Time: 04/29/15 10:05 AM  Result Value Ref Range   WBC 7.5 3.6 - 11.0 K/uL   RBC 4.85 3.80 - 5.20 MIL/uL   Hemoglobin 14.5 12.0 - 16.0 g/dL   HCT 43.0 35.0 - 47.0 %   MCV 88.6 80.0 - 100.0 fL   MCH 29.9 26.0 - 34.0 pg   MCHC 33.8 32.0 - 36.0 g/dL   RDW 14.0 11.5 - 14.5 %   Platelets 267 150 - 440 K/uL   Neutrophils Relative % 65 %   Neutro Abs 4.9 1.4 - 6.5 K/uL   Lymphocytes Relative 21 %   Lymphs Abs 1.6 1.0 - 3.6 K/uL   Monocytes Relative 10 %   Monocytes Absolute 0.8 0.2 - 0.9 K/uL   Eosinophils Relative 3 %   Eosinophils Absolute 0.2 0 - 0.7 K/uL   Basophils Relative 1 %   Basophils Absolute 0.1 0 - 0.1 K/uL  Comprehensive metabolic panel     Status: Abnormal   Collection Time: 04/29/15 10:05 AM  Result Value Ref Range   Sodium 136 135 - 145 mmol/L   Potassium 3.4 (L) 3.5 - 5.1 mmol/L   Chloride 99 (L) 101 - 111 mmol/L   CO2 29 22 - 32 mmol/L   Glucose, Bld 99 65 - 99 mg/dL   BUN 14 6 - 20 mg/dL   Creatinine, Ser 0.76 0.44 - 1.00 mg/dL   Calcium 9.3 8.9 - 10.3 mg/dL   Total Protein 7.3  6.5 - 8.1 g/dL   Albumin 4.2 3.5 - 5.0 g/dL   AST 17 15 - 41 U/L   ALT 17 14 - 54 U/L   Alkaline Phosphatase 104 38 -  126 U/L   Total Bilirubin 0.4 0.3 - 1.2 mg/dL   GFR calc non Af Amer >60 >60 mL/min   GFR calc Af Amer >60 >60 mL/min    Comment: (NOTE) The eGFR has been calculated using the CKD EPI equation. This calculation has not been validated in all clinical situations. eGFR's persistently <60 mL/min signify possible Chronic Kidney Disease.    Anion gap 8 5 - 15  Comprehensive Metabolic Panel (CMET)     Status: Abnormal   Collection Time: 06/12/15  8:12 AM  Result Value Ref Range   Glucose 99 65 - 99 mg/dL   BUN 16 8 - 27 mg/dL   Creatinine, Ser 0.80 0.57 - 1.00 mg/dL   GFR calc non Af Amer 71 >59 mL/min/1.73   GFR calc Af Amer 82 >59 mL/min/1.73   BUN/Creatinine Ratio 20 11 - 26   Sodium 137 134 - 144 mmol/L   Potassium 3.9 3.5 - 5.2 mmol/L   Chloride 94 (L) 96 - 106 mmol/L   CO2 24 18 - 29 mmol/L   Calcium 9.6 8.7 - 10.3 mg/dL   Total Protein 6.6 6.0 - 8.5 g/dL   Albumin 4.5 3.5 - 4.8 g/dL   Globulin, Total 2.1 1.5 - 4.5 g/dL   Albumin/Globulin Ratio 2.1 1.1 - 2.5    Comment: **Effective June 29, 2015 the reference interval**   for A/G Ratio will be changing to:              Age                Female          Female           0 -  7 days       1.1 - 2.3       1.1 - 2.3           8 - 30 days       1.2 - 2.8       1.2 - 2.8           1 -  6 months     1.3 - 3.6       1.3 - 3.6    7 months -  5 years      1.5 - 2.6       1.5 - 2.6              > 5 years      1.2 - 2.2       1.2 - 2.2    Bilirubin Total 0.5 0.0 - 1.2 mg/dL   Alkaline Phosphatase 103 39 - 117 IU/L   AST 18 0 - 40 IU/L   ALT 19 0 - 32 IU/L  Lipid Profile     Status: Abnormal   Collection Time: 06/12/15  8:12 AM  Result Value Ref Range   Cholesterol, Total 233 (H) 100 - 199 mg/dL   Triglycerides 159 (H) 0 - 149 mg/dL   HDL 47 >39 mg/dL   VLDL Cholesterol Cal 32 5 - 40 mg/dL   LDL Calculated 154  (H) 0 - 99 mg/dL   Chol/HDL Ratio 5.0 (H) 0.0 - 4.4 ratio units    Comment:                                   T. Chol/HDL Ratio  Men  Women                               1/2 Avg.Risk  3.4    3.3                                   Avg.Risk  5.0    4.4                                2X Avg.Risk  9.6    7.1                                3X Avg.Risk 23.4   11.0   Vitamin D 1,25 dihydroxy     Status: None   Collection Time: 06/12/15  8:12 AM  Result Value Ref Range   Vitamin D 1, 25 (OH)2 Total 39 pg/mL    Comment: Reference Range: Adults: 21 - 65    Vitamin D2 1, 25 (OH)2 <10 pg/mL   Vitamin D3 1, 25 (OH)2 34 pg/mL  Vitamin D 25 hydroxy     Status: None   Collection Time: 06/12/15  8:12 AM  Result Value Ref Range   Vit D, 25-Hydroxy 37.1 30.0 - 100.0 ng/mL    Comment: Vitamin D deficiency has been defined by the [place] and an Endocrine Society practice guideline as a level of serum 25-OH vitamin D less than 20 ng/mL (1,2). The Endocrine Society went on to further define vitamin D insufficiency as a level between 21 and 29 ng/mL (2). 1. IOM (Institute of Medicine). 2010. Dietary reference    intakes for calcium and D. [place]: The    [employer]. 2. Holick MF, Binkley [place], Bischoff-Ferrari HA, et al.    Evaluation, treatment, and prevention of vitamin D    deficiency: an Endocrine Society clinical practice    guideline. JCEM. 2011 Jul; 96(7):1911-30.      Assessment & Plan  Problem List Items Addressed This Visit      Cardiovascular and Mediastinum   Essential (primary) hypertension - Primary   Relevant Medications   losartan (COZAAR) 100 MG tablet   amLODipine (NORVASC) 10 MG tablet     Respiratory   COPD, mild (HCC)      Meds ordered this encounter  Medications  . losartan (COZAAR) 100 MG tablet    Sig: Take 1 tablet (100 mg total) by mouth daily.    Dispense:  90 tablet    Refill:  3     PATIENT'S INSURANCE IS REQUESTING TRANSITION TO 90 DAY SSUPPLY. IF OKAY, PLEASE SUBMIT NEW RX. THANKS!  Marland Kitchen amLODipine (NORVASC) 10 MG tablet    Sig: Take 1 tablet (10 mg total) by mouth daily.    Dispense:  90 tablet    Refill:  3   1. Essential (primary) hypertension  - losartan (COZAAR) 100 MG tablet; Take 1 tablet (100 mg total) by mouth daily.  Dispense: 90 tablet; Refill: 3 (increased from 50 mg/d)  - amLODipine (NORVASC) 10 MG tablet; Take 1 tablet (10 mg total) by mouth daily.  Dispense: 90 tablet; Refill: 3(increased from 5 mg/d).  RTC-1 month

## 2015-07-22 DIAGNOSIS — J449 Chronic obstructive pulmonary disease, unspecified: Secondary | ICD-10-CM | POA: Diagnosis not present

## 2015-07-30 DIAGNOSIS — H2513 Age-related nuclear cataract, bilateral: Secondary | ICD-10-CM | POA: Diagnosis not present

## 2015-08-06 ENCOUNTER — Encounter: Payer: Self-pay | Admitting: Pain Medicine

## 2015-08-06 ENCOUNTER — Ambulatory Visit: Payer: Medicare Other | Attending: Pain Medicine | Admitting: Pain Medicine

## 2015-08-06 VITALS — BP 114/60 | HR 101 | Temp 97.9°F | Resp 17 | Ht 65.0 in | Wt 210.0 lb

## 2015-08-06 DIAGNOSIS — Z9889 Other specified postprocedural states: Secondary | ICD-10-CM | POA: Diagnosis not present

## 2015-08-06 DIAGNOSIS — M160 Bilateral primary osteoarthritis of hip: Secondary | ICD-10-CM | POA: Diagnosis not present

## 2015-08-06 DIAGNOSIS — M47816 Spondylosis without myelopathy or radiculopathy, lumbar region: Secondary | ICD-10-CM | POA: Diagnosis not present

## 2015-08-06 DIAGNOSIS — M19011 Primary osteoarthritis, right shoulder: Secondary | ICD-10-CM | POA: Insufficient documentation

## 2015-08-06 DIAGNOSIS — M546 Pain in thoracic spine: Secondary | ICD-10-CM | POA: Diagnosis present

## 2015-08-06 DIAGNOSIS — M5136 Other intervertebral disc degeneration, lumbar region: Secondary | ICD-10-CM | POA: Diagnosis not present

## 2015-08-06 DIAGNOSIS — M545 Low back pain: Secondary | ICD-10-CM | POA: Diagnosis present

## 2015-08-06 DIAGNOSIS — M170 Bilateral primary osteoarthritis of knee: Secondary | ICD-10-CM | POA: Insufficient documentation

## 2015-08-06 DIAGNOSIS — M47817 Spondylosis without myelopathy or radiculopathy, lumbosacral region: Secondary | ICD-10-CM | POA: Diagnosis not present

## 2015-08-06 DIAGNOSIS — M791 Myalgia: Secondary | ICD-10-CM | POA: Diagnosis not present

## 2015-08-06 DIAGNOSIS — M5416 Radiculopathy, lumbar region: Secondary | ICD-10-CM | POA: Diagnosis not present

## 2015-08-06 DIAGNOSIS — M79606 Pain in leg, unspecified: Secondary | ICD-10-CM | POA: Diagnosis present

## 2015-08-06 DIAGNOSIS — M533 Sacrococcygeal disorders, not elsewhere classified: Secondary | ICD-10-CM | POA: Diagnosis not present

## 2015-08-06 DIAGNOSIS — M19012 Primary osteoarthritis, left shoulder: Secondary | ICD-10-CM | POA: Diagnosis not present

## 2015-08-06 MED ORDER — TRAMADOL HCL 50 MG PO TABS: ORAL_TABLET | ORAL | Status: DC

## 2015-08-06 NOTE — Progress Notes (Signed)
Safety precautions to be maintained throughout the outpatient stay will include: orient to surroundings, keep bed in low position, maintain call bell within reach at all times, provide assistance with transfer out of bed and ambulation.  

## 2015-08-06 NOTE — Patient Instructions (Addendum)
PLAN   Continue present medication  tramadol. Caution, tramadol can cause respiratory depression, excessive sedation, confusion, and other side effects  F/U PCP  Dr. Luan Pulling for evaliation of  BP lower extremity swelling and general medical condition.   F/U surgical evaluation. May consider pending follow-up evaluations  F/U neurological evaluation. May consider PNCV/EMG studies and other studies pending follow-up evaluations  May consider radiofrequency rhizolysis or intraspinal procedures pending response to present treatment and F/U evaluation   Patient to call [facility] should patient have concerns prior to scheduled return appointment

## 2015-08-06 NOTE — Progress Notes (Signed)
   Subjective:    Patient ID: Candace Tapia, female    DOB: 08-09-1935, 80 y.o.   MRN: [id number]  HPI  The patient is a 80 year old female who returns to pain management for further evaluation and treatment of pain involving the lower back lower extremity region and mid back region with predominant pain occurring in the lower back radiating to the buttocks aggravated by standing and walking. The patient is a improvement of pain with treatment in pain management Center and denies any trauma change in events of daily living because change in symptomatology. We have discussed patient's condition and patient is tolerating tramadol well without undesirable side effects. We will consider patient for interventional treatment should there be exacerbation of pain unresponsive to noninterventional treatment measures. The patient is with understanding and agreement suggested treatment plan. The patient is with known degenerative changes the lumbar spine is without plans for surgery was recommended at this time    Review of Systems     Objective:   Physical Exam  There was tenderness to palpation with paraspinal musculature region of the cervical region cervical facet region a mild degree there was mild tenderness of the splenius capitis and occipitalis musculature regions. Palpation of the acromioclavicular and glenohumeral joint regions were with tenderness to palpation with tenderness of the acromioclavicular and glenohumeral joint regions a moderate degree with decreased range of motion of the left shoulder of significant degree and decreased range of motion of the right shoulder as well. Tinel and Phalen's maneuver were without increased pain of significant degree. Palpation of the thoracic facet thoracic paraspinal musculature region was with tenderness to palpation of muscle spasm without crepitus of the thoracic region noted. Palpation over the lumbar paraspinal musculature region lumbar facet region  was attends to palpation of moderate degree with lateral bending rotation extension and palpation of the lumbar facets reproducing moderate discomfort. Straight leg raise was tolerates approximately 20 without increased pain with dorsiflexion noted. The knees were tenderness to palpation and EHL strength of the lower extremities was decreased. No definite sensory deficit or dermatomal distribution was detected. There was negative clonus negative Homans. Outpatient over the PSIS and PII S region associated with moderate discomfort. There was mild tenderness of the greater trochanteric region iliotibial band region. Nontender and no costovertebral angle tenderness noted      Assessment & Plan:     Degenerative disc disease of the lumbar spine Multilevel degenerative changes lumbar spine with  Facet degenerative changes at multiple levels and Spurling  Facet syndrome  Sacroiliac joint dysfunction  Degenerative joint disease of knees  Degenerative joint disease of shoulders (status post surgery of left and right shoulders)  Degenerative joint disease of the hips    PLAN   Continue present medication  tramadol. Caution, tramadol can cause respiratory depression, excessive sedation, confusion, and other side effects  F/U PCP  Dr. Luan Pulling for evaliation of  BP lower extremity swelling and general medical condition.   F/U surgical evaluation. May consider pending follow-up evaluations  F/U neurological evaluation. May consider PNCV/EMG studies and other studies pending follow-up evaluations  May consider radiofrequency rhizolysis or intraspinal procedures pending response to present treatment and F/U evaluation   Patient to call Pain Management Center should patient have concerns prior to scheduled return appointment

## 2015-08-18 ENCOUNTER — Encounter: Payer: Self-pay | Admitting: Family Medicine

## 2015-08-18 ENCOUNTER — Ambulatory Visit (INDEPENDENT_AMBULATORY_CARE_PROVIDER_SITE_OTHER): Payer: Medicare Other | Admitting: Family Medicine

## 2015-08-18 VITALS — BP 135/70 | HR 82 | Temp 98.4°F | Resp 16 | Ht 65.5 in | Wt 215.0 lb

## 2015-08-18 DIAGNOSIS — J449 Chronic obstructive pulmonary disease, unspecified: Secondary | ICD-10-CM | POA: Diagnosis not present

## 2015-08-18 DIAGNOSIS — I10 Essential (primary) hypertension: Secondary | ICD-10-CM

## 2015-08-18 MED ORDER — AMLODIPINE BESYLATE 10 MG PO TABS: ORAL_TABLET | ORAL | Status: DC

## 2015-08-18 MED ORDER — CHLORTHALIDONE 25 MG PO TABS: 25.0000 mg | ORAL_TABLET | Freq: Every day | ORAL | Status: DC

## 2015-08-18 NOTE — Progress Notes (Signed)
Name: Candace Tapia   MRN: [id number]    DOB: 07/06/1935   Date:08/18/2015       Progress Note  Subjective  Chief Complaint  Chief Complaint  Patient presents with  . Follow-up    Hypertension    HPI Here for f/u of HBP.  Has COPD and chronic pain from arthritis.  Breathing stable at present.  No problem-specific assessment & plan notes found for this encounter.   Past Medical History  Diagnosis Date  . Breast cancer ([place]) 2014    radiation and taking exemestane  . Cancer of left breast ([place]) 10/27/2014  . Allergy   . Arthritis   . COPD (chronic obstructive pulmonary disease) ([place])   . GERD (gastroesophageal reflux disease)   . Hypertension     Past Surgical History  Procedure Laterality Date  . Breast surgery    . Total shoulder replacement Bilateral 2010 and 2013  . Abdominal hysterectomy    . Joint replacement    . Lymph node dissection      neck    Family History  Problem Relation Age of Onset  . Breast cancer Mother 76  . Breast cancer Sister 32  . Kidney disease Father     Social History   Social History  . Marital Status: Widowed    Spouse Name: N/A  . Number of Children: N/A  . Years of Education: N/A   Occupational History  . Not on file.   Social History Main Topics  . Smoking status: Former Research scientist (life sciences)  . Smokeless tobacco: Never Used  . Alcohol Use: No  . Drug Use: No  . Sexual Activity: Not on file   Other Topics Concern  . Not on file   Social History Narrative     Current outpatient prescriptions:  .  albuterol (PROAIR HFA) 108 (90 BASE) MCG/ACT inhaler, Inhale into the lungs., Disp: , Rfl:  .  amLODipine (NORVASC) 10 MG tablet, Take 1/2 tablet each morning, Disp: 90 tablet, Rfl: 3 .  Ascorbic Acid (VITAMIN C) 100 MG tablet, Take 100 mg by mouth daily., Disp: , Rfl:  .  calcium gluconate 650 MG tablet, Take 650 mg by mouth daily., Disp: , Rfl:  .  exemestane (AROMASIN) 25 MG tablet, Take 25 mg by mouth daily after breakfast. ,  Disp: , Rfl:  .  fexofenadine (ALLEGRA ALLERGY) 180 MG tablet, Take by mouth as needed. , Disp: , Rfl:  .  fluticasone (FLONASE) 50 MCG/ACT nasal spray, Place into both nostrils as needed. , Disp: , Rfl:  .  fluticasone (FLOVENT DISKUS) 50 MCG/BLIST diskus inhaler, Place into the nose as needed. , Disp: , Rfl:  .  losartan (COZAAR) 100 MG tablet, Take 1 tablet (100 mg total) by mouth daily., Disp: 90 tablet, Rfl: 3 .  NEXIUM 20 MG capsule, Take 1 capsule (20 mg total) by mouth daily., Disp: 90 capsule, Rfl: 3 .  tiotropium (SPIRIVA HANDIHALER) 18 MCG inhalation capsule, Place 18 mcg into inhaler and inhale daily. , Disp: , Rfl:  .  traMADol (ULTRAM) 50 MG tablet, Limit 2 - 4 tabs  by mouth per day if tolerated, Disp: 110 tablet, Rfl: 0 .  chlorthalidone (HYGROTON) 25 MG tablet, Take 1 tablet (25 mg total) by mouth daily., Disp: 30 tablet, Rfl: 6  Allergies  Allergen Reactions  . Penicillins Other (See Comments)  . Statins     Not sure what exact med was; just made her feel bad (can't elaborate)  . Penicillin  G Rash  . Sulfa Antibiotics Other (See Comments) and Rash     Review of Systems  Constitutional: Negative for fever, chills, weight loss and malaise/fatigue.  HENT: Negative for hearing loss.   Eyes: Negative for blurred vision and double vision.  Respiratory: Positive for sputum production, shortness of breath and wheezing (occ.). Negative for cough.   Cardiovascular: Negative for chest pain, palpitations and leg swelling.  Gastrointestinal: Negative for heartburn, abdominal pain and blood in stool.  Genitourinary: Negative for dysuria, urgency and frequency.       Incomplete emptying of bladder at times.  Musculoskeletal: Positive for back pain and joint pain.  Skin: Negative for rash.  Neurological: Negative for dizziness, tremors, weakness and headaches.      Objective  Filed Vitals:   08/18/15 1535 08/18/15 1625  BP: 120/70 135/70  Pulse: 82   Temp: 98.4 F (36.9  C)   TempSrc: Oral   Resp: 16   Height: 5' 5.5" (1.664 m)   Weight: 215 lb (97.523 kg)   SpO2: 97%     Physical Exam  Constitutional: She is oriented to person, place, and time and well-developed, well-nourished, and in no distress. No distress.  HENT:  Head: Normocephalic and atraumatic.  Eyes: Conjunctivae and EOM are normal. Pupils are equal, round, and reactive to light. No scleral icterus.  Neck: Normal range of motion. Neck supple. No thyromegaly present.  Cardiovascular: Normal rate, regular rhythm and normal heart sounds.  Exam reveals no gallop and no friction rub.   No murmur heard. Pulmonary/Chest: Effort normal and breath sounds normal. No respiratory distress. She has no wheezes. She has no rales.  Decreased breath sounds throughout.  Musculoskeletal: She exhibits edema (2+ edema of both feet).  Lymphadenopathy:    She has no cervical adenopathy.  Neurological: She is alert and oriented to person, place, and time.  Vitals reviewed.      Recent Results (from the past 2160 hour(s))  Comprehensive Metabolic Panel (CMET)     Status: Abnormal   Collection Time: 06/12/15  8:12 AM  Result Value Ref Range   Glucose 99 65 - 99 mg/dL   BUN 16 8 - 27 mg/dL   Creatinine, Ser 0.80 0.57 - 1.00 mg/dL   GFR calc non Af Amer 71 >59 mL/min/1.73   GFR calc Af Amer 82 >59 mL/min/1.73   BUN/Creatinine Ratio 20 11 - 26   Sodium 137 134 - 144 mmol/L   Potassium 3.9 3.5 - 5.2 mmol/L   Chloride 94 (L) 96 - 106 mmol/L   CO2 24 18 - 29 mmol/L   Calcium 9.6 8.7 - 10.3 mg/dL   Total Protein 6.6 6.0 - 8.5 g/dL   Albumin 4.5 3.5 - 4.8 g/dL   Globulin, Total 2.1 1.5 - 4.5 g/dL   Albumin/Globulin Ratio 2.1 1.1 - 2.5    Comment: **Effective June 29, 2015 the reference interval**   for A/G Ratio will be changing to:              Age                Female          Female           0 -  7 days       1.1 - 2.3       1.1 - 2.3           8 - 30 days       1.2 -  2.8       1.2 - 2.8            1 -  6 months     1.3 - 3.6       1.3 - 3.6    7 months -  5 years      1.5 - 2.6       1.5 - 2.6              > 5 years      1.2 - 2.2       1.2 - 2.2    Bilirubin Total 0.5 0.0 - 1.2 mg/dL   Alkaline Phosphatase 103 39 - 117 IU/L   AST 18 0 - 40 IU/L   ALT 19 0 - 32 IU/L  Lipid Profile     Status: Abnormal   Collection Time: 06/12/15  8:12 AM  Result Value Ref Range   Cholesterol, Total 233 (H) 100 - 199 mg/dL   Triglycerides 159 (H) 0 - 149 mg/dL   HDL 47 >39 mg/dL   VLDL Cholesterol Cal 32 5 - 40 mg/dL   LDL Calculated 154 (H) 0 - 99 mg/dL   Chol/HDL Ratio 5.0 (H) 0.0 - 4.4 ratio units    Comment:                                   T. Chol/HDL Ratio                                             Men  Women                               1/2 Avg.Risk  3.4    3.3                                   Avg.Risk  5.0    4.4                                2X Avg.Risk  9.6    7.1                                3X Avg.Risk 23.4   11.0   Vitamin D 1,25 dihydroxy     Status: None   Collection Time: 06/12/15  8:12 AM  Result Value Ref Range   Vitamin D 1, 25 (OH)2 Total 39 pg/mL    Comment: Reference Range: Adults: 21 - 65    Vitamin D2 1, 25 (OH)2 <10 pg/mL   Vitamin D3 1, 25 (OH)2 34 pg/mL  Vitamin D 25 hydroxy     Status: None   Collection Time: 06/12/15  8:12 AM  Result Value Ref Range   Vit D, 25-Hydroxy 37.1 30.0 - 100.0 ng/mL    Comment: Vitamin D deficiency has been defined by the [place] and an Endocrine Society practice guideline as a level of serum 25-OH vitamin D less than 20 ng/mL (1,2). The Endocrine Society went on to further define vitamin D insufficiency  as a level between 21 and 29 ng/mL (2). 1. IOM (Institute of Medicine). 2010. Dietary reference    intakes for calcium and D. [place]: The    [employer]. 2. Holick MF, [place], Bischoff-Ferrari HA, et al.    Evaluation, treatment, and prevention of vitamin D    deficiency: an  Endocrine Society clinical practice    guideline. JCEM. 2011 Jul; 96(7):1911-30.      Assessment & Plan  Problem List Items Addressed This Visit      Cardiovascular and Mediastinum   Essential (primary) hypertension - Primary   Relevant Medications   amLODipine (NORVASC) 10 MG tablet   chlorthalidone (HYGROTON) 25 MG tablet     Respiratory   COPD (chronic obstructive pulmonary disease) with chronic bronchitis (HCC)      Meds ordered this encounter  Medications  . amLODipine (NORVASC) 10 MG tablet    Sig: Take 1/2 tablet each morning    Dispense:  90 tablet    Refill:  3  . chlorthalidone (HYGROTON) 25 MG tablet    Sig: Take 1 tablet (25 mg total) by mouth daily.    Dispense:  30 tablet    Refill:  6   .1. Essential (primary) hypertension  - amLODipine (NORVASC) 10 MG tablet; Take 1/2 tablet each morning  Dispense: 90 tablet; Refill: 3 - chlorthalidone (HYGROTON) 25 MG tablet; Take 1 tablet (25 mg total) by mouth daily.  Dispense: 30 tablet; Refill: 6 -cont Losartan 100 mg/d RTC 6 weeks 2. COPD (chronic obstructive pulmonary disease) with chronic bronchitis (HCC) Cont meds and see Dr. Raul Del.

## 2015-08-19 ENCOUNTER — Telehealth: Payer: Self-pay | Admitting: Family Medicine

## 2015-08-19 NOTE — Telephone Encounter (Signed)
Pt.called states that the medicine chlorthalidone  25 mg had  Sulfa  In it and she was allergic to sulfa. Pt call back  # is  [phone]

## 2015-08-20 ENCOUNTER — Telehealth: Payer: Self-pay | Admitting: Family Medicine

## 2015-08-20 DIAGNOSIS — J449 Chronic obstructive pulmonary disease, unspecified: Secondary | ICD-10-CM | POA: Diagnosis not present

## 2015-08-20 NOTE — Telephone Encounter (Signed)
I have that she had a rash with sulfa.  The med that I gave her looks somwwhat like sulfa.  Being allergic does not necessarily mean that she will be allergic to the other.  The worse that she should have, even if she were to have a problem is a rash.  Most people do not cross react.  She can take 1/2 tablet a day for a few days to see how she does and then increase after she does ok on it.  If hs develope a rash, she should take Benadryl, stop taking it and let me know.-jh

## 2015-08-20 NOTE — Telephone Encounter (Signed)
Patient aware.[place] 

## 2015-08-20 NOTE — Telephone Encounter (Signed)
The chlorthalidone contains sulfur and pt is allergic.  Please call [phone]

## 2015-08-21 DIAGNOSIS — J449 Chronic obstructive pulmonary disease, unspecified: Secondary | ICD-10-CM | POA: Diagnosis not present

## 2015-09-01 ENCOUNTER — Encounter: Payer: Self-pay | Admitting: Pain Medicine

## 2015-09-01 ENCOUNTER — Ambulatory Visit: Payer: Medicare Other | Attending: Pain Medicine | Admitting: Pain Medicine

## 2015-09-01 VITALS — BP 140/70 | HR 89 | Temp 98.3°F | Resp 16 | Ht 65.5 in | Wt 210.0 lb

## 2015-09-01 DIAGNOSIS — M19012 Primary osteoarthritis, left shoulder: Secondary | ICD-10-CM

## 2015-09-01 DIAGNOSIS — M791 Myalgia: Secondary | ICD-10-CM | POA: Diagnosis not present

## 2015-09-01 DIAGNOSIS — M5416 Radiculopathy, lumbar region: Secondary | ICD-10-CM | POA: Diagnosis not present

## 2015-09-01 DIAGNOSIS — M47816 Spondylosis without myelopathy or radiculopathy, lumbar region: Secondary | ICD-10-CM

## 2015-09-01 DIAGNOSIS — M533 Sacrococcygeal disorders, not elsewhere classified: Secondary | ICD-10-CM

## 2015-09-01 DIAGNOSIS — M160 Bilateral primary osteoarthritis of hip: Secondary | ICD-10-CM

## 2015-09-01 DIAGNOSIS — M19011 Primary osteoarthritis, right shoulder: Secondary | ICD-10-CM

## 2015-09-01 DIAGNOSIS — M47817 Spondylosis without myelopathy or radiculopathy, lumbosacral region: Secondary | ICD-10-CM | POA: Diagnosis not present

## 2015-09-01 DIAGNOSIS — M5136 Other intervertebral disc degeneration, lumbar region: Secondary | ICD-10-CM

## 2015-09-01 MED ORDER — TRAMADOL HCL 50 MG PO TABS: ORAL_TABLET | ORAL | Status: DC

## 2015-09-01 NOTE — Progress Notes (Signed)
   Subjective:    Patient ID: Candace Tapia, female    DOB: November 26, 1935, 80 y.o.   MRN: [id number]  HPI  The patient is a 80 year old female who returns to pain management for further evaluation and treatment of pain involving the neck shoulders and have back upper and lower extremity regions. The patient has had some improvement of her painvisit patient states that the tramadol appears to be rather weak the patient denies drowsiness confusion or other undesirable side effects due to the tramadol. We discussed patient's condition and I'll concerns regarding patient taking the tramadol. Decision was made after discussing patient's condition and to increase the tramadol from a maximum of 4 pills per day to a maximum of 5 pills per day. Most again reemphasized the need for patient to exercise extreme caution to avoid respiratory depression, excessive sedation, confusion and other side effects. The patient expressed understanding and willingness to comply. We will remain available to consider additional modifications of treatment pending follow-up evaluation. All agreed to suggested treatment plan     Review of Systems     Objective:   Physical Exam  There was tenderness to palpation of paraspinal misreading cervical region cervical facet region mild to moderate degree. There was mild to moderate tenderness of the splenius capitis and occipitalis musculature regions as well palpation of the acromioclavicular and glenohumeral joint region reproduced mild to moderate discomfort with limited range of motion of the shoulders noted. There appeared to be unremarkable Spurling's maneuver. Palpation of the thoracic region thoracic facet region was attends to palpation with moderate muscle spasms noted in the thoracic region with no crepitus of the thoracic region noted. Palpation of the lumbar paraspinal musculature region lumbar facet region was with moderate tenderness to palpation with lateral bending  rotation extension and palpation of the lumbar facets reproducing moderate discomfort. There was moderate tenderness of the PSIS and PII S regions with mild tenderness along the greater trochanteric region iliotibial band region. Straight leg raise was tolerates approximately 20 without increase of pain with dorsiflexion noted. EHL strength appeared to be slightly decreased. There was negative clonus negative Homans. He deficit or dermatomal distribution detected. The abdomen was nontender with no costovertebral tenderness noted    Assessment & Plan:     Degenerative disc disease of the lumbar spine Multilevel degenerative changes lumbar spine with  Facet degenerative changes at multiple levels and Spurling  Facet syndrome  Sacroiliac joint dysfunction  Degenerative joint disease of knees  Degenerative joint disease of shoulders (status post surgery of left and right shoulders)  Degenerative joint disease of the hips       PLAN   Continue present medication  tramadol. Caution, tramadol can cause respiratory depression, excessive sedation, confusion, and other side effects  F/U PCP  Dr. Luan Pulling for evaliation of  BP lower extremity swelling and general medical condition.   F/U surgical evaluation. May consider pending follow-up evaluations  F/U neurological evaluation. May consider PNCV/EMG studies and other studies pending follow-up evaluations  May consider radiofrequency rhizolysis or intraspinal procedures pending response to present treatment and F/U evaluation   Patient to call Pain Management Center should patient have concerns prior to scheduled return appointment

## 2015-09-01 NOTE — Patient Instructions (Addendum)
PLAN   Continue present medication  tramadol. Caution, tramadol can cause respiratory depression, excessive sedation, confusion, and other side effects  F/U PCP  Dr. Luan Pulling for evaliation of  BP lower extremity swelling and general medical condition.   F/U surgical evaluation. May consider pending follow-up evaluations  F/U neurological evaluation. May consider PNCV/EMG studies and other studies pending follow-up evaluations  May consider radiofrequency rhizolysis or intraspinal procedures pending response to present treatment and F/U evaluation   Patient to call Pain Management Center should patient have concerns prior to scheduled return appointmentPain Management Discharge Instructions  General Discharge Instructions :  If you need to reach your doctor call: Monday-Friday 8:00 am - 4:00 pm at [phone] or toll free [phone].  After clinic hours [phone] to have operator reach doctor.  Bring all of your medication bottles to all your appointments in the pain clinic.  To cancel or reschedule your appointment with Pain Management please remember to call 24 hours in advance to avoid a fee.  Refer to the educational materials which you have been given on: General Risks, I had my Procedure. Discharge Instructions, Post Sedation.  Post Procedure Instructions:  The drugs you were given will stay in your system until tomorrow, so for the next 24 hours you should not drive, make any legal decisions or drink any alcoholic beverages.  You may eat anything you prefer, but it is better to start with liquids then soups and crackers, and gradually work up to solid foods.  Please notify your doctor immediately if you have any unusual bleeding, trouble breathing or pain that is not related to your normal pain.  Depending on the type of procedure that was done, some parts of your body may feel week and/or numb.  This usually clears up by tonight or the next day.  Walk with the use of  an assistive device or accompanied by an adult for the 24 hours.  You may use ice on the affected area for the first 24 hours.  Put ice in a Ziploc bag and cover with a towel and place against area 15 minutes on 15 minutes off.  You may switch to heat after 24 hours.

## 2015-09-02 ENCOUNTER — Ambulatory Visit: Payer: Medicare Other | Admitting: Pain Medicine

## 2015-09-25 ENCOUNTER — Ambulatory Visit
Admission: RE | Admit: 2015-09-25 | Discharge: 2015-09-25 | Disposition: A | Discharge status: Home / Self Care | Payer: Medicare Other | Source: Ambulatory Visit | Attending: Family Medicine | Admitting: Family Medicine

## 2015-09-25 ENCOUNTER — Other Ambulatory Visit: Payer: Self-pay | Admitting: Family Medicine

## 2015-09-25 DIAGNOSIS — R928 Other abnormal and inconclusive findings on diagnostic imaging of breast: Secondary | ICD-10-CM | POA: Diagnosis not present

## 2015-09-25 DIAGNOSIS — C50412 Malignant neoplasm of upper-outer quadrant of left female breast: Secondary | ICD-10-CM

## 2015-09-29 ENCOUNTER — Encounter: Payer: Self-pay | Admitting: Pain Medicine

## 2015-09-29 ENCOUNTER — Ambulatory Visit: Payer: Medicare Other | Attending: Pain Medicine | Admitting: Pain Medicine

## 2015-09-29 VITALS — BP 146/63 | HR 97 | Temp 98.6°F | Resp 15 | Ht 65.0 in | Wt 210.0 lb

## 2015-09-29 DIAGNOSIS — M47816 Spondylosis without myelopathy or radiculopathy, lumbar region: Secondary | ICD-10-CM | POA: Insufficient documentation

## 2015-09-29 DIAGNOSIS — M545 Low back pain: Secondary | ICD-10-CM | POA: Diagnosis present

## 2015-09-29 DIAGNOSIS — M19012 Primary osteoarthritis, left shoulder: Secondary | ICD-10-CM | POA: Insufficient documentation

## 2015-09-29 DIAGNOSIS — Z9889 Other specified postprocedural states: Secondary | ICD-10-CM | POA: Diagnosis not present

## 2015-09-29 DIAGNOSIS — M5136 Other intervertebral disc degeneration, lumbar region: Secondary | ICD-10-CM | POA: Insufficient documentation

## 2015-09-29 DIAGNOSIS — M19011 Primary osteoarthritis, right shoulder: Secondary | ICD-10-CM | POA: Diagnosis not present

## 2015-09-29 DIAGNOSIS — M79606 Pain in leg, unspecified: Secondary | ICD-10-CM | POA: Diagnosis present

## 2015-09-29 DIAGNOSIS — M533 Sacrococcygeal disorders, not elsewhere classified: Secondary | ICD-10-CM | POA: Insufficient documentation

## 2015-09-29 DIAGNOSIS — M170 Bilateral primary osteoarthritis of knee: Secondary | ICD-10-CM | POA: Insufficient documentation

## 2015-09-29 DIAGNOSIS — M47817 Spondylosis without myelopathy or radiculopathy, lumbosacral region: Secondary | ICD-10-CM | POA: Diagnosis not present

## 2015-09-29 DIAGNOSIS — M5416 Radiculopathy, lumbar region: Secondary | ICD-10-CM | POA: Diagnosis not present

## 2015-09-29 DIAGNOSIS — M160 Bilateral primary osteoarthritis of hip: Secondary | ICD-10-CM | POA: Insufficient documentation

## 2015-09-29 DIAGNOSIS — M791 Myalgia: Secondary | ICD-10-CM | POA: Diagnosis not present

## 2015-09-29 MED ORDER — HYDROCODONE-ACETAMINOPHEN 5-325 MG PO TABS: ORAL_TABLET | ORAL | Status: DC

## 2015-09-29 MED ORDER — TRAMADOL HCL 50 MG PO TABS: ORAL_TABLET | ORAL | Status: DC

## 2015-09-29 NOTE — Patient Instructions (Addendum)
PLAN   Continue present medication  and STOP ALEVE  and TRAMADOL  and  BEGIN HYDROCODONE ACETAMINOPHEN     CAUTION HYDROCODONE ACETAMINOPHEN  can cause respiratory depression, excessive sedation, confusion, and other side effects   HYDROCODONE ACETAMINOPHEN can cause respiratory depression and cause you to stop breathing, cause excessive sedation, cause confusion and other side effects.  Exercise extreme caution when taking medication and call EMS or go to the Emergency Department immediately if you develop any of these symptoms   F/U PCP  Dr. Luan Pulling for evaliation of  BP lower extremity swelling and general medical condition. . Dr. Luan Pulling may allow you to take Aleve. Discuss taking Aleve with Dr. Luan Pulling  F/U surgical evaluation. May consider pending follow-up evaluations  F/U neurological evaluation. May consider PNCV/EMG studies and other studies pending follow-up evaluations  Try doing light exercises as discussed with caution to avoid aggravation of symptoms  May consider radiofrequency rhizolysis or intraspinal procedures pending response to present treatment and F/U evaluation   Patient to call Pain Management Center should patient have concerns prior to scheduled return appointment

## 2015-09-29 NOTE — Progress Notes (Signed)
Subjective:    Patient ID: Candace Tapia, female    DOB: 05-19-1935, 80 y.o.   MRN: [id number]  HPI  The patient is a 80 year old female who returns to pain management for further evaluation and treatment of pain involving the lower back region lower extremity region and shoulders. The patient states that she was unable to continue the tramadol. The patient stated that the tramadol has call significant itching. We discussed patient's condition on today's visit and patient admitted to taking hydrocodone acetaminophen in the past without undesirable side effects. The patient stated that she has taken as many as 3 hydrocodone acetaminophen on a regular basis. We discussed patient's condition and informed patient that we would replace the tramadol with hydrocodone acetaminophen. We informed patient that we will remain available to consider additional modifications of treatment pending response to treatment and follow-up evaluation. Regarding the side effects of the hydrocodone acetaminophen and advised patient to stay in the presence of an adult drive you when beginning to take the hydrocodone acetaminophen. The patient was understanding and agreed to suggested treatment plan. The patient denied any recent trauma change in events of daily living the call significant change in symptomatology.  Review of Systems     Objective:   Physical Exam  There was tenderness of the paraspinal muscular region cervical region cervical facet region a moderate degree with moderate tenderness of the splenius capitis and occipitalis regions. Palpation of the acromioclavicular and glenohumeral joint regions reproduce moderate to moderately severe discomfort. The patient had difficulty attempting to perform drop test. There was decreased range of motion of the shoulders noted. Palpation over the thoracic region thoracic facet region was attends to palpation of moderate degree without crepitus of the thoracic region noted.  Palpation over the lumbar paraspinal muscular region lumbar facet region was attends to palpation of moderate degree with lateral bending rotation extension and palpation over the lumbar facets reproducing moderate discomfort. The PSIS and PII S regions. Palpation over the greater trochanteric region iliotibial band region reproduced mild discomfort. Straight leg raise was tolerates approximately 30 without increased pain with dorsiflexion noted. There was no significant lower extremity edema noted. EHL strength appeared to be slightly decreased. No sensory deficit or dermatomal distribution detected. There was negative clonus negative Homans. Abdomen nontender with no costovertebral tenderness noted.      Assessment & Plan:     Degenerative disc disease of the lumbar spine Multilevel degenerative changes lumbar spine with  Facet degenerative changes at multiple levels and Spurling  Facet syndrome  Sacroiliac joint dysfunction  Degenerative joint disease of knees  Degenerative joint disease of shoulders (status post surgery of left and right shoulders)  Degenerative joint disease of the hips     PLAN   Continue present medication  and STOP ALEVE  and TRAMADOL  and  BEGIN HYDROCODONE ACETAMINOPHEN     CAUTION HYDROCODONE ACETAMINOPHEN  can cause respiratory depression, excessive sedation, confusion, and other side effects   HYDROCODONE ACETAMINOPHEN can cause respiratory depression and cause you to stop breathing, cause excessive sedation, cause confusion and other side effects.  Exercise extreme caution when taking medication and call EMS or go to the Emergency Department immediately if you develop any of these symptoms   F/U PCP  Dr. Luan Pulling for evaliation of  BP and general medical condition. . Dr. Luan Pulling may allow you to take Aleve. Discuss taking Aleve with Dr. Luan Pulling  F/U surgical evaluation. May consider pending follow-up evaluations  F/U neurological evaluation. May  consider PNCV/EMG studies and other studies pending follow-up evaluations  Try doing light exercises as discussed with caution to avoid aggravation of symptoms  May consider radiofrequency rhizolysis or intraspinal procedures pending response to present treatment and F/U evaluation   Patient to call [facility] Management Center should patient have concerns prior to scheduled return appointment

## 2015-09-29 NOTE — Progress Notes (Signed)
Safety precautions to be maintained throughout the outpatient stay will include: orient to surroundings, keep bed in low position, maintain call bell within reach at all times, provide assistance with transfer out of bed and ambulation.  

## 2015-10-08 ENCOUNTER — Encounter: Payer: Self-pay | Admitting: Family Medicine

## 2015-10-08 ENCOUNTER — Ambulatory Visit (INDEPENDENT_AMBULATORY_CARE_PROVIDER_SITE_OTHER): Payer: Medicare Other | Admitting: Family Medicine

## 2015-10-08 VITALS — BP 121/79 | HR 85 | Temp 97.7°F | Resp 16 | Ht 65.5 in | Wt 215.0 lb

## 2015-10-08 DIAGNOSIS — M5136 Other intervertebral disc degeneration, lumbar region: Secondary | ICD-10-CM | POA: Diagnosis not present

## 2015-10-08 DIAGNOSIS — K219 Gastro-esophageal reflux disease without esophagitis: Secondary | ICD-10-CM

## 2015-10-08 DIAGNOSIS — J449 Chronic obstructive pulmonary disease, unspecified: Secondary | ICD-10-CM | POA: Diagnosis not present

## 2015-10-08 DIAGNOSIS — I10 Essential (primary) hypertension: Secondary | ICD-10-CM | POA: Diagnosis not present

## 2015-10-08 DIAGNOSIS — M1990 Unspecified osteoarthritis, unspecified site: Secondary | ICD-10-CM

## 2015-10-08 DIAGNOSIS — C50912 Malignant neoplasm of unspecified site of left female breast: Secondary | ICD-10-CM | POA: Diagnosis not present

## 2015-10-08 NOTE — Progress Notes (Signed)
Name: Candace Tapia   MRN: [id number]    DOB: [birth year]   Date:10/08/2015       Progress Note  Subjective  Chief Complaint  No chief complaint on file.   HPI Here for f/u of HBP and COPD.  Has chronic pain and takes opoid from pain MD.  Asks if she can take Aleve.  GERD is doing well at present.  Her breathing is not as good in hot, humid weather.  No problem-specific assessment & plan notes found for this encounter.   Past Medical History  Diagnosis Date  . Breast cancer ([place]) 2014    radiation and taking exemestane  . Cancer of left breast ([place]) 10/27/2014  . Allergy   . Arthritis   . COPD (chronic obstructive pulmonary disease) ([place])   . GERD (gastroesophageal reflux disease)   . Hypertension     Past Surgical History  Procedure Laterality Date  . Breast surgery    . Total shoulder replacement Bilateral 2010 and 2013  . Abdominal hysterectomy    . Joint replacement    . Lymph node dissection      neck    Family History  Problem Relation Age of Onset  . Breast cancer Mother 46  . Breast cancer Sister 97  . Kidney disease Father     Social History   Social History  . Marital Status: Widowed    Spouse Name: N/A  . Number of Children: N/A  . Years of Education: N/A   Occupational History  . Not on file.   Social History Main Topics  . Smoking status: Former Research scientist (life sciences)  . Smokeless tobacco: Never Used  . Alcohol Use: No  . Drug Use: No  . Sexual Activity: Not on file   Other Topics Concern  . Not on file   Social History Narrative     Current outpatient prescriptions:  .  albuterol (PROAIR HFA) 108 (90 BASE) MCG/ACT inhaler, Inhale into the lungs., Disp: , Rfl:  .  amLODipine (NORVASC) 10 MG tablet, Take 1/2 tablet each morning, Disp: 90 tablet, Rfl: 3 .  calcium gluconate 650 MG tablet, Take 650 mg by mouth daily., Disp: , Rfl:  .  chlorthalidone (HYGROTON) 25 MG tablet, Take 1 tablet (25 mg total) by mouth daily., Disp: 30 tablet, Rfl: 6 .   exemestane (AROMASIN) 25 MG tablet, Take 25 mg by mouth daily after breakfast. , Disp: , Rfl:  .  fexofenadine (ALLEGRA ALLERGY) 180 MG tablet, Take by mouth as needed. Reported on 09/29/2015, Disp: , Rfl:  .  fluticasone (FLONASE) 50 MCG/ACT nasal spray, Place into both nostrils as needed. , Disp: , Rfl:  .  HYDROcodone-acetaminophen (NORCO/VICODIN) 5-325 MG tablet, Limit one half to one tablet by mouth per day or twice per day if tolerated   (NO TRAMADOL), Disp: 50 tablet, Rfl: 0 .  losartan (COZAAR) 100 MG tablet, Take 1 tablet (100 mg total) by mouth daily., Disp: 90 tablet, Rfl: 3 .  Multiple Vitamins-Minerals (MULTIVITAMIN WOMEN 50+) TABS, Take 2 tablets by mouth daily., Disp: , Rfl:  .  NEXIUM 20 MG capsule, Take 1 capsule (20 mg total) by mouth daily., Disp: 90 capsule, Rfl: 3 .  tiotropium (SPIRIVA HANDIHALER) 18 MCG inhalation capsule, Place 18 mcg into inhaler and inhale daily. , Disp: , Rfl:  .  fluticasone (FLOVENT DISKUS) 50 MCG/BLIST diskus inhaler, Place into the nose as needed. Reported on 10/08/2015, Disp: , Rfl:   Allergies  Allergen Reactions  . Tramadol  Hcl Itching    Per pt it caused me to itch all over  . Penicillins Other (See Comments)  . Statins     Not sure what exact med was; just made her feel bad (can't elaborate)  . Penicillin G Rash  . Sulfa Antibiotics Other (See Comments) and Rash     Review of Systems  Constitutional: Negative for fever, chills, weight loss and malaise/fatigue.  HENT: Negative for hearing loss.   Eyes: Negative for blurred vision and double vision.  Respiratory: Positive for cough, shortness of breath and wheezing. Negative for sputum production.   Cardiovascular: Negative for chest pain, palpitations and leg swelling.  Gastrointestinal: Negative for heartburn, abdominal pain and blood in stool.  Genitourinary: Negative for dysuria, urgency and frequency.  Musculoskeletal: Positive for joint pain (diffuse chronic osteoarthritis).  Skin:  Negative for rash.  Neurological: Negative for dizziness, tremors, weakness and headaches.      Objective  Filed Vitals:   10/08/15 0806  BP: 121/79  Pulse: 85  Temp: 97.7 F (36.5 C)  TempSrc: Oral  Resp: 16  Height: 5' 5.5" (1.664 m)  Weight: 215 lb (97.523 kg)    Physical Exam  Constitutional: She is oriented to person, place, and time and well-developed, well-nourished, and in no distress. No distress.  HENT:  Head: Normocephalic and atraumatic.  Eyes: Conjunctivae and EOM are normal. Pupils are equal, round, and reactive to light. No scleral icterus.  Neck: Normal range of motion. Neck supple. Carotid bruit is not present. No thyromegaly present.  Cardiovascular: Normal rate, regular rhythm and normal heart sounds.  Exam reveals no gallop and no friction rub.   No murmur heard. Pulmonary/Chest: Effort normal and breath sounds normal. No respiratory distress. She has no wheezes. She has no rales.  Abdominal: Soft. Bowel sounds are normal. She exhibits no distension, no abdominal bruit and no mass. There is no tenderness.  Musculoskeletal: She exhibits edema (trace bilateral pedal edema.).  Lymphadenopathy:    She has no cervical adenopathy.  Neurological: She is alert and oriented to person, place, and time.  Psychiatric:  Patient is anxious and agitated over her chronic pain, but refuses to take any depression meds.  She has tried them in the past and they make her "crazy".  Vitals reviewed.      No results found for this or any previous visit (from the past 2160 hour(s)).   Assessment & Plan  Problem List Items Addressed This Visit      Cardiovascular and Mediastinum   Essential (primary) hypertension - Primary     Respiratory   COPD (chronic obstructive pulmonary disease) with chronic bronchitis (HCC)     Digestive   Gastro-esophageal reflux disease without esophagitis     Musculoskeletal and Integument   Arthritis   DDD (degenerative disc disease),  lumbar      Meds ordered this encounter  Medications  . Multiple Vitamins-Minerals (MULTIVITAMIN WOMEN 50+) TABS    Sig: Take 2 tablets by mouth daily.   1. Essential (primary) hypertension Cont meds  2. COPD (chronic obstructive pulmonary disease) with chronic bronchitis (HCC) Use Spriva daily and ProAir prn  3. Gastro-esophageal reflux disease without esophagitis Cont meds  4. Arthritis May take Aleve.  Recommend OTC Move Free Ultra each evening  5. DDD (degenerative disc disease), lumbar

## 2015-10-21 DIAGNOSIS — J449 Chronic obstructive pulmonary disease, unspecified: Secondary | ICD-10-CM | POA: Diagnosis not present

## 2015-10-27 ENCOUNTER — Inpatient Hospital Stay: Payer: Medicare Other | Attending: Family Medicine | Admitting: Family Medicine

## 2015-10-27 ENCOUNTER — Ambulatory Visit: Payer: Medicare Other | Admitting: Oncology

## 2015-10-27 ENCOUNTER — Inpatient Hospital Stay: Payer: Medicare Other

## 2015-10-27 ENCOUNTER — Other Ambulatory Visit: Payer: Medicare Other

## 2015-10-27 VITALS — BP 157/83 | HR 86 | Temp 98.0°F | Wt 217.2 lb

## 2015-10-27 DIAGNOSIS — Z170 Estrogen receptor positive status [ER+]: Secondary | ICD-10-CM | POA: Diagnosis not present

## 2015-10-27 DIAGNOSIS — Z923 Personal history of irradiation: Secondary | ICD-10-CM | POA: Insufficient documentation

## 2015-10-27 DIAGNOSIS — Z87891 Personal history of nicotine dependence: Secondary | ICD-10-CM | POA: Diagnosis not present

## 2015-10-27 DIAGNOSIS — Z79899 Other long term (current) drug therapy: Secondary | ICD-10-CM | POA: Diagnosis not present

## 2015-10-27 DIAGNOSIS — Z96619 Presence of unspecified artificial shoulder joint: Secondary | ICD-10-CM | POA: Insufficient documentation

## 2015-10-27 DIAGNOSIS — J449 Chronic obstructive pulmonary disease, unspecified: Secondary | ICD-10-CM | POA: Insufficient documentation

## 2015-10-27 DIAGNOSIS — M8580 Other specified disorders of bone density and structure, unspecified site: Secondary | ICD-10-CM

## 2015-10-27 DIAGNOSIS — K219 Gastro-esophageal reflux disease without esophagitis: Secondary | ICD-10-CM | POA: Diagnosis not present

## 2015-10-27 DIAGNOSIS — I10 Essential (primary) hypertension: Secondary | ICD-10-CM | POA: Diagnosis not present

## 2015-10-27 DIAGNOSIS — C50912 Malignant neoplasm of unspecified site of left female breast: Secondary | ICD-10-CM | POA: Diagnosis not present

## 2015-10-27 DIAGNOSIS — Z79811 Long term (current) use of aromatase inhibitors: Secondary | ICD-10-CM | POA: Diagnosis not present

## 2015-10-27 DIAGNOSIS — C50312 Malignant neoplasm of lower-inner quadrant of left female breast: Secondary | ICD-10-CM

## 2015-10-27 LAB — CBC WITH DIFFERENTIAL/PLATELET
BASOS PCT: 1 %
Basophils Absolute: 0.1 10*3/uL (ref 0–0.1)
Eosinophils Absolute: 0.2 10*3/uL (ref 0–0.7)
Eosinophils Relative: 2 %
HEMATOCRIT: 39.7 % (ref 35.0–47.0)
HEMOGLOBIN: 13.9 g/dL (ref 12.0–16.0)
Lymphocytes Relative: 15 %
Lymphs Abs: 1.3 10*3/uL (ref 1.0–3.6)
MCH: 30.6 pg (ref 26.0–34.0)
MCHC: 35 g/dL (ref 32.0–36.0)
MCV: 87.2 fL (ref 80.0–100.0)
MONOS PCT: 7 %
Monocytes Absolute: 0.6 10*3/uL (ref 0.2–0.9)
NEUTROS ABS: 6.9 10*3/uL — AB (ref 1.4–6.5)
Neutrophils Relative %: 75 %
Platelets: 239 10*3/uL (ref 150–440)
RBC: 4.56 MIL/uL (ref 3.80–5.20)
RDW: 14.2 % (ref 11.5–14.5)
WBC: 9.1 10*3/uL (ref 3.6–11.0)

## 2015-10-27 LAB — COMPREHENSIVE METABOLIC PANEL
ALBUMIN: 4.3 g/dL (ref 3.5–5.0)
ALK PHOS: 77 U/L (ref 38–126)
ALT: 24 U/L (ref 14–54)
AST: 22 U/L (ref 15–41)
Anion gap: 11 (ref 5–15)
BILIRUBIN TOTAL: 0.6 mg/dL (ref 0.3–1.2)
BUN: 20 mg/dL (ref 6–20)
CALCIUM: 9.7 mg/dL (ref 8.9–10.3)
CO2: 29 mmol/L (ref 22–32)
CREATININE: 1.01 mg/dL — AB (ref 0.44–1.00)
Chloride: 98 mmol/L — ABNORMAL LOW (ref 101–111)
GFR calc Af Amer: >60 mL/min (ref >60)
GFR calc non Af Amer: 52 mL/min — ABNORMAL LOW (ref >60)
GLUCOSE: 108 mg/dL — AB (ref 65–99)
Potassium: 3.9 mmol/L (ref 3.5–5.1)
Sodium: 138 mmol/L (ref 135–145)
TOTAL PROTEIN: 7.3 g/dL (ref 6.5–8.1)

## 2015-10-27 NOTE — Progress Notes (Signed)
[name] @ [facility] [phone]  Fax:([phone]) [place]: February 01, 1936  MR#: [id number]  UUV#:[id number]  Patient Care Team: Arlis Porta., MD as PCP - General (Family Medicine)  CHIEF COMPLAINT:  Chief Complaint  Patient presents with  . Breast Cancer    follow up    Oncology History   80 year old female status post wide local excision of the left breast for a stage I (T1 B. N0 M0) invasive mammary carcinoma ER positive PR borderline HER-2/neu not overexpressed She has  finished radiation therapy on September of 2014 She has declined any genetic study Started on Femara  in November of 2014 Candace Tapia was Discontinued because of side effect and may 2015. Started on Aromasin in May of  2015        Cancer of left breast ([place])   10/27/2014 Initial Diagnosis Cancer of left breast    INTERVAL HISTORY:  Patient is here for further evaluation and treatment consideration regarding history of carcinoma of left breast. She is status post lumpectomy, radiation therapy. She is currently on Aromasin and tolerating treatment very well. She continues with joint pains that she relates to chronic arthritic pains that she's had since she turned 67. Most recent mammogram was in June 2017 and reported as BI-RADS 1, negative. She also had a bone density scan in June 2016 that showed osteopenia. She continues with routine follow-up with the pain clinic, Dr. Primus Bravo due to chronic back pain, hip pain, and knee pain. She otherwise denies any new bony pain or fracture.   REVIEW OF SYSTEMS:   GENERAL:  Feels good.  Active.  No fevers, sweats or weight loss. PERFORMANCE STATUS (ECOG):  0 HEENT:  No visual changes, runny nose, sore throat, mouth sores or tenderness. Lungs: No shortness of breath or cough.  No hemoptysis. Cardiac:  No chest pain, palpitations, orthopnea, or PND. GI:  No nausea, vomiting, diarrhea, constipation, melena or hematochezia. GU:  No urgency,  frequency, dysuria, or hematuria. Musculoskeletal:  No back pain.  No joint pain.  No muscle tenderness. Extremities:  No pain or swelling. Skin:  No rashes or skin changes. Neuro:  No headache, numbness or weakness, balance or coordination issues. Endocrine:  No diabetes, thyroid issues, hot flashes or night sweats. Psych:  No mood changes, depression or anxiety. Pain:  No focal pain. Review of systems:  All other systems reviewed and found to be negative. As per HPI. Otherwise, a complete review of systems is negatve.  PAST MEDICAL HISTORY: Past Medical History  Diagnosis Date  . Breast cancer ([place]) 2014    radiation and taking exemestane  . Cancer of left breast ([place]) 10/27/2014  . Allergy   . Arthritis   . COPD (chronic obstructive pulmonary disease) ([place])   . GERD (gastroesophageal reflux disease)   . Hypertension     PAST SURGICAL HISTORY: Past Surgical History  Procedure Laterality Date  . Breast surgery    . Total shoulder replacement Bilateral 2010 and 2013  . Abdominal hysterectomy    . Joint replacement    . Lymph node dissection      neck    FAMILY HISTORY Family History  Problem Relation Age of Onset  . Breast cancer Mother 29  . Breast cancer Sister 97  . Kidney disease Father     ADVANCED DIRECTIVES:  Patient does not have any living will or healthcare power of attorney.  Patient declined to get any information .  Available resources had been discussed.  We will follow-up on subsequent appointments regarding this issue HEALTH MAINTENANCE: Social History  Substance Use Topics  . Smoking status: Former Research scientist (life sciences)  . Smokeless tobacco: Never Used  . Alcohol Use: No      Allergies  Allergen Reactions  . Tramadol Hcl Itching    Per pt it caused me to itch all over  . Penicillins Other (See Comments)  . Statins     Not sure what exact med was; just made her feel bad (can't elaborate)  . Penicillin G Rash  . Sulfa Antibiotics Other (See Comments) and  Rash    Current Outpatient Prescriptions  Medication Sig Dispense Refill  . albuterol (PROAIR HFA) 108 (90 BASE) MCG/ACT inhaler Inhale into the lungs.    Marland Kitchen amLODipine (NORVASC) 10 MG tablet Take 1/2 tablet each morning 90 tablet 3  . calcium gluconate 650 MG tablet Take 650 mg by mouth daily.    . chlorthalidone (HYGROTON) 25 MG tablet Take 1 tablet (25 mg total) by mouth daily. 30 tablet 6  . exemestane (AROMASIN) 25 MG tablet Take 25 mg by mouth daily after breakfast.     . fexofenadine (ALLEGRA ALLERGY) 180 MG tablet Take by mouth as needed. Reported on 09/29/2015    . fluticasone (FLONASE) 50 MCG/ACT nasal spray Place into both nostrils as needed.     . fluticasone (FLOVENT DISKUS) 50 MCG/BLIST diskus inhaler Place into the nose as needed. Reported on 10/08/2015    . HYDROcodone-acetaminophen (NORCO/VICODIN) 5-325 MG tablet Limit one half to one tablet by mouth per day or twice per day if tolerated   (NO TRAMADOL) 50 tablet 0  . losartan (COZAAR) 100 MG tablet Take 1 tablet (100 mg total) by mouth daily. 90 tablet 3  . Multiple Vitamins-Minerals (MULTIVITAMIN WOMEN 50+) TABS Take 2 tablets by mouth daily.    Marland Kitchen NEXIUM 20 MG capsule Take 1 capsule (20 mg total) by mouth daily. 90 capsule 3  . tiotropium (SPIRIVA HANDIHALER) 18 MCG inhalation capsule Place 18 mcg into inhaler and inhale daily.      No current facility-administered medications for this visit.    OBJECTIVE:  Filed Vitals:   10/27/15 1027  BP: 157/83  Pulse: 86  Temp: 98 F (36.7 C)     Body mass index is 35.58 kg/(m^2).    ECOG FS:1 - Symptomatic but completely ambulatory  PHYSICAL EXAM: GENERAL:  Well developed, well nourished, sitting comfortably in the exam room in no acute distress. MENTAL STATUS:  Alert and oriented to person, place and time. HEAD:  Normocephalic, atraumatic, face symmetric, no Cushingoid features. EYES: Pupils equal round and reactive to light and accomodation.  No conjunctivitis or scleral  icterus. ENT:  Oropharynx clear without lesion.  Tongue normal. Mucous membranes moist.  RESPIRATORY:  Clear to auscultation without rales, wheezes or rhonchi. CARDIOVASCULAR:  Regular rate and rhythm without murmur, rub or gallop. BREAST:  Right breast without masses, skin changes or nipple discharge.  Left breast Lumpectomy site with scar tissue.  No palpable masses.  Radiation changes on the skin.   ABDOMEN:  Soft, non-tender, with active bowel sounds, and no hepatosplenomegaly.  No masses. BACK:  No CVA tenderness.  No tenderness on percussion of the back or rib cage. SKIN:  No rashes, ulcers or lesions. EXTREMITIES: No edema, no skin discoloration or tenderness.  No palpable cords. LYMPH NODES: No palpable cervical, supraclavicular, axillary adenopathy  NEUROLOGICAL: Unremarkable. PSYCH:  Appropriate.   LAB RESULTS:  Clinical Support on 10/27/2015  Component Date Value Ref Range Status  . WBC 10/27/2015 9.1  3.6 - 11.0 K/uL Final  . RBC 10/27/2015 4.56  3.80 - 5.20 MIL/uL Final  . Hemoglobin 10/27/2015 13.9  12.0 - 16.0 g/dL Final  . HCT 10/27/2015 39.7  35.0 - 47.0 % Final  . MCV 10/27/2015 87.2  80.0 - 100.0 fL Final  . MCH 10/27/2015 30.6  26.0 - 34.0 pg Final  . MCHC 10/27/2015 35.0  32.0 - 36.0 g/dL Final  . RDW 10/27/2015 14.2  11.5 - 14.5 % Final  . Platelets 10/27/2015 239  150 - 440 K/uL Final  . Neutrophils Relative % 10/27/2015 75   Final  . Neutro Abs 10/27/2015 6.9* 1.4 - 6.5 K/uL Final  . Lymphocytes Relative 10/27/2015 15   Final  . Lymphs Abs 10/27/2015 1.3  1.0 - 3.6 K/uL Final  . Monocytes Relative 10/27/2015 7   Final  . Monocytes Absolute 10/27/2015 0.6  0.2 - 0.9 K/uL Final  . Eosinophils Relative 10/27/2015 2   Final  . Eosinophils Absolute 10/27/2015 0.2  0 - 0.7 K/uL Final  . Basophils Relative 10/27/2015 1   Final  . Basophils Absolute 10/27/2015 0.1  0 - 0.1 K/uL Final      STUDIES: No results found.  ASSESSMENT: 1. Carcinoma of left breast,  T1b N0 M0, stage I. ER positive, PR borderline, HER-2/neu negative.  PLAN: 1. Breast cancer. Stage I disease, ER positive on Aromasin. Clinically there is no evidence of recurrent or progressive disease. She also continues with calcium and Vitamin D, vitamin D deficiency managed by primary care physician. Bone density dated June of 2016 osteopenia will be followed if needed further treatment would be added. Mammogram in June 2017 reported as BI-RADS 1, negative.  2. Chronic arthritis. The patient follows with pain clinic, Dr. Primus Bravo, next appointment is January 16 for spinal injections. She denies any new or acute bone pain or changes in arthritic pain.  We'll continue with routine follow-up in approximately 6 months.  Patient expressed understanding and was in agreement with this plan. She also understands that She can call clinic at any time with any questions, concerns, or complaints.   Dr. [name] was available for consultation and review of plan of care for this patient.  Evlyn Kanner, NP   10/27/2015 10:43 AM

## 2015-10-29 ENCOUNTER — Encounter: Payer: Self-pay | Admitting: Pain Medicine

## 2015-10-29 ENCOUNTER — Ambulatory Visit: Payer: Medicare Other | Attending: Pain Medicine | Admitting: Pain Medicine

## 2015-10-29 VITALS — BP 145/60 | HR 91 | Temp 98.2°F | Resp 16 | Ht 65.0 in | Wt 216.0 lb

## 2015-10-29 DIAGNOSIS — M47816 Spondylosis without myelopathy or radiculopathy, lumbar region: Secondary | ICD-10-CM | POA: Diagnosis not present

## 2015-10-29 DIAGNOSIS — M5136 Other intervertebral disc degeneration, lumbar region: Secondary | ICD-10-CM | POA: Insufficient documentation

## 2015-10-29 DIAGNOSIS — M160 Bilateral primary osteoarthritis of hip: Secondary | ICD-10-CM | POA: Insufficient documentation

## 2015-10-29 DIAGNOSIS — M170 Bilateral primary osteoarthritis of knee: Secondary | ICD-10-CM | POA: Diagnosis not present

## 2015-10-29 DIAGNOSIS — M533 Sacrococcygeal disorders, not elsewhere classified: Secondary | ICD-10-CM | POA: Diagnosis not present

## 2015-10-29 DIAGNOSIS — Z9889 Other specified postprocedural states: Secondary | ICD-10-CM | POA: Diagnosis not present

## 2015-10-29 DIAGNOSIS — M19012 Primary osteoarthritis, left shoulder: Secondary | ICD-10-CM | POA: Diagnosis not present

## 2015-10-29 DIAGNOSIS — M19011 Primary osteoarthritis, right shoulder: Secondary | ICD-10-CM | POA: Insufficient documentation

## 2015-10-29 DIAGNOSIS — M791 Myalgia: Secondary | ICD-10-CM | POA: Diagnosis not present

## 2015-10-29 DIAGNOSIS — M47817 Spondylosis without myelopathy or radiculopathy, lumbosacral region: Secondary | ICD-10-CM | POA: Diagnosis not present

## 2015-10-29 DIAGNOSIS — M545 Low back pain: Secondary | ICD-10-CM | POA: Diagnosis present

## 2015-10-29 DIAGNOSIS — M5416 Radiculopathy, lumbar region: Secondary | ICD-10-CM | POA: Diagnosis not present

## 2015-10-29 MED ORDER — HYDROCODONE-ACETAMINOPHEN 5-325 MG PO TABS: ORAL_TABLET | ORAL | Status: DC

## 2015-10-29 NOTE — Progress Notes (Signed)
   Subjective:    Patient ID: [name], female    DOB: 1936-01-24, 80 y.o.   MRN: [id number]  HPI  The patient is a 80 year old female who returns to pain management for further evaluation and treatment of pain involving the region of the lower back lower extremity region predominantly the patient states that she is tolerating hydrocodone acetaminophen. Well without undesirable side effects. The patient is without trauma or change in events of daily living the call significant change in symptomatology. We will continue presently prescribed medications and we will remain available to consider modification of treatment should there be significant change in condition. All agreed to suggested treatment plan.  Review of Systems     Objective:   Physical Exam  Palpation over the splenius capitis and occipitalis regions reproduced pain of mild degree with mild tenderness of the cervical facet cervical paraspinal musculature region. Palpation of the thoracic facet thoracic paraspinal musculature region was with tenderness to palpation of mild to moderate degree without crepitus of the thoracic region noted. There was decreased range of motion of the shoulders noted. Patient appeared to be with out significant increase of pain with Tinel and Phalen's maneuver and was with grip strength was slightly decreased. Palpation of the PSIS and PII S regions reproduced moderate degree with moderate tenderness over the PSIS PII S region on the left as well as on the right. There was mild tenderness of the greater trochanteric region iliotibial band region. Straight leg raise was tolerates approximately 20 without increased pain with dorsiflexion noted. There was negative clonus negative Homans. Abdomen nontender with no costovertebral tenderness noted      Assessment & Plan:     Degenerative disc disease of the lumbar spine Multilevel degenerative changes lumbar spine with  Facet degenerative changes at  multiple levels and Spurling  Facet syndromePLAN   Continue present medication   HYDROCODONE ACETAMINOPHEN   NO TRAMADOL    CAUTION HYDROCODONE ACETAMINOPHEN  can cause respiratory depression, excessive sedation, confusion, and other side effects   HYDROCODONE ACETAMINOPHEN can cause respiratory depression and cause you to stop breathing, cause excessive sedation, cause confusion and other side effects.  Exercise extreme caution when taking medication and call EMS or go to the Emergency Department immediately if you develop any of these symptoms   CONTINUE Aleve limit use of Aleve as discussed with Dr. Luan Pulling  F/U PCP  Dr. Luan Pulling for evaliation of  BP lower extremity swelling and general medical condition.  F/U surgical evaluation. May consider pending follow-up evaluations. We will avoid surgical evaluation at this time  F/U neurological evaluation. May consider PNCV/EMG studies and other studies pending follow-up evaluations  Try doing light exercises as discussed with caution to avoid aggravation of symptoms as previously discussed  May consider radiofrequency rhizolysis or intraspinal procedures pending response to present treatment and F/U evaluation   Patient to call Pain Management Center should patient have concerns prior to scheduled return appointment  Sacroiliac joint dysfunction  Degenerative joint disease of knees  Degenerative joint disease of shoulders (status post surgery of left and right shoulders)  Degenerative joint disease of the hips

## 2015-10-29 NOTE — Patient Instructions (Addendum)
PLAN   Continue present medication   HYDROCODONE ACETAMINOPHEN   NO TRAMADOL    CAUTION HYDROCODONE ACETAMINOPHEN  can cause respiratory depression, excessive sedation, confusion, and other side effects   HYDROCODONE ACETAMINOPHEN can cause respiratory depression and cause you to stop breathing, cause excessive sedation, cause confusion and other side effects.  Exercise extreme caution when taking medication and call EMS or go to the Emergency Department immediately if you develop any of these symptoms   CONTINUE Aleve limit use of Aleve as discussed with Dr. Luan Pulling  F/U PCP  Dr. Luan Pulling for evaliation of  BP lower extremity swelling and general medical condition.  F/U surgical evaluation. May consider pending follow-up evaluations. We will avoid surgical evaluation at this time  F/U neurological evaluation. May consider PNCV/EMG studies and other studies pending follow-up evaluations  Try doing light exercises as discussed with caution to avoid aggravation of symptoms as previously discussed  May consider radiofrequency rhizolysis or intraspinal procedures pending response to present treatment and F/U evaluation   Patient to call Pain Management Center should patient have concerns prior to scheduled return appointment

## 2015-10-29 NOTE — Progress Notes (Signed)
Safety precautions to be maintained throughout the outpatient stay will include: orient to surroundings, keep bed in low position, maintain call bell within reach at all times, provide assistance with transfer out of bed and ambulation.  

## 2015-11-04 DIAGNOSIS — M7542 Impingement syndrome of left shoulder: Secondary | ICD-10-CM | POA: Diagnosis not present

## 2015-11-21 DIAGNOSIS — J449 Chronic obstructive pulmonary disease, unspecified: Secondary | ICD-10-CM | POA: Diagnosis not present

## 2015-12-01 ENCOUNTER — Ambulatory Visit: Payer: Medicare Other | Attending: Pain Medicine | Admitting: Pain Medicine

## 2015-12-01 ENCOUNTER — Encounter: Payer: Self-pay | Admitting: Pain Medicine

## 2015-12-01 VITALS — BP 160/95 | HR 81 | Temp 96.3°F | Resp 16 | Ht 65.0 in | Wt 215.0 lb

## 2015-12-01 DIAGNOSIS — M533 Sacrococcygeal disorders, not elsewhere classified: Secondary | ICD-10-CM | POA: Insufficient documentation

## 2015-12-01 DIAGNOSIS — M19011 Primary osteoarthritis, right shoulder: Secondary | ICD-10-CM | POA: Insufficient documentation

## 2015-12-01 DIAGNOSIS — M545 Low back pain: Secondary | ICD-10-CM | POA: Diagnosis present

## 2015-12-01 DIAGNOSIS — I890 Lymphedema, not elsewhere classified: Secondary | ICD-10-CM | POA: Insufficient documentation

## 2015-12-01 DIAGNOSIS — M791 Myalgia: Secondary | ICD-10-CM | POA: Diagnosis not present

## 2015-12-01 DIAGNOSIS — Z9889 Other specified postprocedural states: Secondary | ICD-10-CM | POA: Insufficient documentation

## 2015-12-01 DIAGNOSIS — M170 Bilateral primary osteoarthritis of knee: Secondary | ICD-10-CM | POA: Insufficient documentation

## 2015-12-01 DIAGNOSIS — M160 Bilateral primary osteoarthritis of hip: Secondary | ICD-10-CM | POA: Insufficient documentation

## 2015-12-01 DIAGNOSIS — M79606 Pain in leg, unspecified: Secondary | ICD-10-CM | POA: Diagnosis present

## 2015-12-01 DIAGNOSIS — M47817 Spondylosis without myelopathy or radiculopathy, lumbosacral region: Secondary | ICD-10-CM | POA: Diagnosis not present

## 2015-12-01 DIAGNOSIS — M19012 Primary osteoarthritis, left shoulder: Secondary | ICD-10-CM | POA: Diagnosis not present

## 2015-12-01 DIAGNOSIS — M47816 Spondylosis without myelopathy or radiculopathy, lumbar region: Secondary | ICD-10-CM | POA: Insufficient documentation

## 2015-12-01 DIAGNOSIS — M62830 Muscle spasm of back: Secondary | ICD-10-CM | POA: Diagnosis not present

## 2015-12-01 DIAGNOSIS — M5136 Other intervertebral disc degeneration, lumbar region: Secondary | ICD-10-CM | POA: Diagnosis not present

## 2015-12-01 DIAGNOSIS — M5416 Radiculopathy, lumbar region: Secondary | ICD-10-CM | POA: Diagnosis not present

## 2015-12-01 MED ORDER — HYDROCODONE-ACETAMINOPHEN 5-325 MG PO TABS: ORAL_TABLET | ORAL | 0 refills | Status: DC

## 2015-12-01 NOTE — Patient Instructions (Addendum)
PLAN   Continue present medication   HYDROCODONE ACETAMINOPHEN   NO TRAMADOL    CAUTION HYDROCODONE ACETAMINOPHEN  can cause respiratory depression, excessive sedation, confusion, and other side effects   HYDROCODONE ACETAMINOPHEN can cause respiratory depression and cause you to stop breathing, cause excessive sedation, cause confusion and other side effects.  Exercise extreme caution when taking medication and call EMS or go to the Emergency Department immediately if you develop any of these symptoms   CONTINUE Aleve and limit use of Aleve as discussed with Dr. Luan Pulling  F/U PCP  Dr. Luan Pulling for evaliation of  BP lower extremity swelling and general medical condition as discussed  F/U surgical evaluation. May consider pending follow-up evaluations. We will avoid surgical evaluation at this time  F/U neurological evaluation. May consider PNCV/EMG studies and other studies pending follow-up evaluations  Try doing light exercises as discussed with caution to avoid aggravation of symptoms as previously discussed  May consider radiofrequency rhizolysis or intraspinal procedures pending response to present treatment and F/U evaluation   Patient to call Pain Management Center should patient have concerns prior to scheduled return appointmentPain Management Discharge Instructions  General Discharge Instructions :  If you need to reach your doctor call: Monday-Friday 8:00 am - 4:00 pm at [phone] or toll free [phone].  After clinic hours [phone] to have operator reach doctor.  Bring all of your medication bottles to all your appointments in the pain clinic.  To cancel or reschedule your appointment with Pain Management please remember to call 24 hours in advance to avoid a fee.  Refer to the educational materials which you have been given on: General Risks, I had my Procedure. Discharge Instructions, Post Sedation.  Post Procedure Instructions:  The drugs you were given will  stay in your system until tomorrow, so for the next 24 hours you should not drive, make any legal decisions or drink any alcoholic beverages.  You may eat anything you prefer, but it is better to start with liquids then soups and crackers, and gradually work up to solid foods.  Please notify your doctor immediately if you have any unusual bleeding, trouble breathing or pain that is not related to your normal pain.  Depending on the type of procedure that was done, some parts of your body may feel week and/or numb.  This usually clears up by tonight or the next day.  Walk with the use of an assistive device or accompanied by an adult for the 24 hours.  You may use ice on the affected area for the first 24 hours.  Put ice in a Ziploc bag and cover with a towel and place against area 15 minutes on 15 minutes off.  You may switch to heat after 24 hours.

## 2015-12-01 NOTE — Progress Notes (Signed)
Safety precautions to be maintained throughout the outpatient stay will include: orient to surroundings, keep bed in low position, maintain call bell within reach at all times, provide assistance with transfer out of bed and ambulation.  

## 2015-12-01 NOTE — Progress Notes (Signed)
     The patient is a 80 year old female who returns to pain management for further evaluation and treatment of pain involving the lumbar lower extremity region. The patient also is with pain involving the region of the shoulder and is with pain fairly well-controlled present treatment regimen. The patient does states that she is taking 2 hydrocodone acetaminophen per day which appears to relieve most of her pain and allow [place] to perform activities of daily living without experiencing severe disabling pain. The patient denies drowsiness confusion or other side for side effects taking the medication. We discussed patient's condition and will avoid interventional treatment at this time. The patient was with understanding and in agreement with suggested treatment plan.     Physical examination  There was tenderness of the splenius capitis and occipitalis musculature region of moderate degree with moderate tenderness of the cervical facet cervical paraspinal musculature region. Palpation of the acromioclavicular and glenohumeral joint regions reproduce moderate discomfort and patient had difficulty attempt to perform drop test. There appeared to be unremarkable Spurling's maneuver. Palpation over the region of the thoracic area was with tenderness to palpation with evidence of muscle spasm involving the thoracic must mature region a moderate degree. No crepitus of the thoracic region was noted. Palpation over the lumbar region was attends to palpation with extension and palpation of the lumbar facets reproducing moderate discomfort. Palpation over the PSIS and PII S regions reproduce moderate discomfort. There was tenderness of the knees with EHL strength being questionably decreased. Palpation of the greater trochanteric region iliotibial band region reproduced moderate discomfort. EHL strength appeared to be slightly decreased without a sensory deficit or dermatomal distribution detected. There was negative  clonus negative Homans. Abdomen nontender with no costovertebral tenderness noted     Assessment    Degenerative disc disease of the lumbar spine Multilevel degenerative changes lumbar spine with  Facet degenerative changes at multiple levels and Spurling  Facet syndrome  Sacroiliac joint dysfunction  Degenerative joint disease of knees  Degenerative joint disease of shoulders (status post surgery of left and right shoulders)  Degenerative joint disease of the hips  Lymphedema of left lower extremity      PLAN   Continue present medication   HYDROCODONE ACETAMINOPHEN   NO TRAMADOL    CAUTION HYDROCODONE ACETAMINOPHEN  can cause respiratory depression, excessive sedation, confusion, and other side effects   HYDROCODONE ACETAMINOPHEN can cause respiratory depression and cause you to stop breathing, cause excessive sedation, cause confusion and other side effects.  Exercise extreme caution when taking medication and call EMS or go to the Emergency Department immediately if you develop any of these symptoms   CONTINUE Aleve and limit use of Aleve as discussed with Dr. Luan Pulling  F/U PCP  Dr. Luan Pulling for evaliation of  BP lower extremity swelling and general medical condition as discussed  F/U surgical evaluation. May consider pending follow-up evaluations. We will avoid surgical evaluation at this time  F/U neurological evaluation. May consider PNCV/EMG studies and other studies pending follow-up evaluations  Try doing light exercises as discussed with caution to avoid aggravation of symptoms as previously discussed  May consider radiofrequency rhizolysis or intraspinal procedures pending response to present treatment and F/U evaluation   Patient to call Pain Management Center should patient have concerns prior to scheduled return appointment

## 2015-12-15 ENCOUNTER — Other Ambulatory Visit: Payer: Self-pay | Admitting: Family Medicine

## 2015-12-15 ENCOUNTER — Other Ambulatory Visit: Payer: Self-pay | Admitting: Oncology

## 2015-12-15 DIAGNOSIS — I10 Essential (primary) hypertension: Secondary | ICD-10-CM

## 2015-12-17 ENCOUNTER — Telehealth: Payer: Self-pay | Admitting: *Deleted

## 2015-12-22 DIAGNOSIS — J449 Chronic obstructive pulmonary disease, unspecified: Secondary | ICD-10-CM | POA: Diagnosis not present

## 2015-12-24 DIAGNOSIS — J449 Chronic obstructive pulmonary disease, unspecified: Secondary | ICD-10-CM | POA: Diagnosis not present

## 2015-12-24 DIAGNOSIS — R0602 Shortness of breath: Secondary | ICD-10-CM | POA: Diagnosis not present

## 2015-12-28 ENCOUNTER — Ambulatory Visit: Payer: Medicare Other | Attending: Pain Medicine | Admitting: Pain Medicine

## 2015-12-28 ENCOUNTER — Encounter: Payer: Self-pay | Admitting: Pain Medicine

## 2015-12-28 VITALS — BP 153/73 | HR 85 | Temp 98.5°F | Resp 18 | Ht 65.5 in | Wt 220.0 lb

## 2015-12-28 DIAGNOSIS — M19012 Primary osteoarthritis, left shoulder: Secondary | ICD-10-CM | POA: Diagnosis not present

## 2015-12-28 DIAGNOSIS — M62830 Muscle spasm of back: Secondary | ICD-10-CM | POA: Diagnosis not present

## 2015-12-28 DIAGNOSIS — M545 Low back pain: Secondary | ICD-10-CM | POA: Diagnosis present

## 2015-12-28 DIAGNOSIS — M47816 Spondylosis without myelopathy or radiculopathy, lumbar region: Secondary | ICD-10-CM | POA: Insufficient documentation

## 2015-12-28 DIAGNOSIS — M5136 Other intervertebral disc degeneration, lumbar region: Secondary | ICD-10-CM | POA: Insufficient documentation

## 2015-12-28 DIAGNOSIS — M160 Bilateral primary osteoarthritis of hip: Secondary | ICD-10-CM | POA: Insufficient documentation

## 2015-12-28 DIAGNOSIS — M19011 Primary osteoarthritis, right shoulder: Secondary | ICD-10-CM | POA: Diagnosis not present

## 2015-12-28 DIAGNOSIS — M25512 Pain in left shoulder: Secondary | ICD-10-CM | POA: Diagnosis present

## 2015-12-28 DIAGNOSIS — M170 Bilateral primary osteoarthritis of knee: Secondary | ICD-10-CM | POA: Insufficient documentation

## 2015-12-28 DIAGNOSIS — Z9889 Other specified postprocedural states: Secondary | ICD-10-CM | POA: Diagnosis not present

## 2015-12-28 DIAGNOSIS — M5416 Radiculopathy, lumbar region: Secondary | ICD-10-CM | POA: Diagnosis not present

## 2015-12-28 DIAGNOSIS — M791 Myalgia: Secondary | ICD-10-CM | POA: Diagnosis not present

## 2015-12-28 DIAGNOSIS — M25511 Pain in right shoulder: Secondary | ICD-10-CM | POA: Diagnosis present

## 2015-12-28 DIAGNOSIS — M533 Sacrococcygeal disorders, not elsewhere classified: Secondary | ICD-10-CM | POA: Insufficient documentation

## 2015-12-28 DIAGNOSIS — I890 Lymphedema, not elsewhere classified: Secondary | ICD-10-CM | POA: Diagnosis not present

## 2015-12-28 DIAGNOSIS — M47817 Spondylosis without myelopathy or radiculopathy, lumbosacral region: Secondary | ICD-10-CM | POA: Diagnosis not present

## 2015-12-28 MED ORDER — HYDROCODONE-ACETAMINOPHEN 5-325 MG PO TABS: ORAL_TABLET | ORAL | 0 refills | Status: DC

## 2015-12-28 NOTE — Patient Instructions (Signed)
PLAN   Continue present medication   HYDROCODONE ACETAMINOPHEN   NO TRAMADOL    CAUTION HYDROCODONE ACETAMINOPHEN  can cause respiratory depression, excessive sedation, confusion, and other side effects   HYDROCODONE ACETAMINOPHEN can cause respiratory depression and cause you to stop breathing, cause excessive sedation, cause confusion and other side effects.  Exercise extreme caution when taking medication and call EMS or go to the Emergency Department immediately if you develop any of these symptoms   CONTINUE Aleve and limit use of Aleve as discussed with Dr. Luan Pulling  F/U PCP  Dr. Luan Pulling for evaliation of  BP lower extremity swelling and general medical condition as discussed  F/U surgical evaluation. May consider pending follow-up evaluations. We will avoid surgical evaluation at this time  F/U neurological evaluation. May consider PNCV/EMG studies and other studies pending follow-up evaluations  Try doing light exercises as discussed with caution to avoid aggravation of symptoms as previously discussed  May consider radiofrequency rhizolysis or intraspinal procedures pending response to present treatment and F/U evaluation   Patient to call Pain Management Center should patient have concerns prior to scheduled return appointment

## 2015-12-28 NOTE — Progress Notes (Signed)
Safety precautions to be maintained throughout the outpatient stay will include: orient to surroundings, keep bed in low position, maintain call bell within reach at all times, provide assistance with transfer out of bed and ambulation.  

## 2015-12-29 NOTE — Progress Notes (Signed)
      The patient is a 80 year old female who returns to pain management for further evaluation and treatment of pain involving the upper back shoulders lower back and lower extremity regions. The patient states that the present time pain is fairly well controlled. Patient denies trauma change in events of daily living the call significant change in symptomatology. The patient is tolerating hydrocodone acetaminophen without any undesirable side effects. We will continue present medication regimen and patient will call pain management should there be significant change in condition prior to scheduled return appointment. All agreed to suggested treatment plan  Physical examination  There was tenderness to palpation of paraspinal musculature region and cervical region cervical facet region of mild degree with mild tenderness of the trapezius levator scapula rhomboid musculature regions. There was tenderness over the region of the acromioclavicular and glenohumeral joint region a moderate degree on the left p with difficulty attempt to performed the drop test. Due to the left shoulder predominantly. There was unremarkable Spurling's maneuver and Tinel and Phalen's maneuver were without increase of pain of significant degree. Palpation over the lower thoracic region was evidence of mild muscle spasm with mild tenderness over the PSIS and PII S region. Lateral bending rotation extension and palpation of the lumbar facets reproduce moderate discomfort. There was negative clonus negative Homans. The abdomen was nontender with no costovertebral angle tenderness noted.       Assessment     Degenerative disc disease of the lumbar spine Multilevel degenerative changes lumbar spine with  Facet degenerative changes at multiple levels and Spurling  Facet syndrome  Sacroiliac joint dysfunction  Degenerative joint disease of knees  Degenerative joint disease of shoulders (status post surgery of left and  right shoulders)  Degenerative joint disease of the hips  Lymphedema of left lower extremity       PLAN   Continue present medication   HYDROCODONE ACETAMINOPHEN   NO TRAMADOL    CAUTION HYDROCODONE ACETAMINOPHEN  can cause respiratory depression, excessive sedation, confusion, and other side effects   HYDROCODONE ACETAMINOPHEN can cause respiratory depression and cause you to stop breathing, cause excessive sedation, cause confusion and other side effects.  Exercise extreme caution when taking medication and call EMS or go to the Emergency Department immediately if you develop any of these symptoms   CONTINUE Aleve and limit use of Aleve as discussed with Dr. Luan Pulling  F/U PCP  Dr. Luan Pulling for evaliation of  BP lower extremity swelling and general medical condition as discussed  F/U surgical evaluation. May consider pending follow-up evaluations. We will avoid surgical evaluation at this time  F/U neurological evaluation. May consider PNCV/EMG studies and other studies pending follow-up evaluations  Try doing light exercises as discussed with caution to avoid aggravation of symptoms as previously discussed  May consider radiofrequency rhizolysis or intraspinal procedures pending response to present treatment and F/U evaluation   Patient to call Pain Management Center should patient have concerns prior to scheduled return appointment

## 2015-12-31 ENCOUNTER — Ambulatory Visit: Payer: Medicare Other | Admitting: Pain Medicine

## 2016-01-07 DIAGNOSIS — I10 Essential (primary) hypertension: Secondary | ICD-10-CM | POA: Diagnosis not present

## 2016-01-07 DIAGNOSIS — E785 Hyperlipidemia, unspecified: Secondary | ICD-10-CM | POA: Insufficient documentation

## 2016-01-07 DIAGNOSIS — M8580 Other specified disorders of bone density and structure, unspecified site: Secondary | ICD-10-CM | POA: Diagnosis not present

## 2016-01-07 DIAGNOSIS — G8929 Other chronic pain: Secondary | ICD-10-CM | POA: Insufficient documentation

## 2016-01-07 DIAGNOSIS — M25559 Pain in unspecified hip: Secondary | ICD-10-CM

## 2016-01-07 DIAGNOSIS — M25552 Pain in left hip: Secondary | ICD-10-CM | POA: Diagnosis not present

## 2016-01-07 DIAGNOSIS — M25551 Pain in right hip: Secondary | ICD-10-CM | POA: Diagnosis not present

## 2016-01-07 DIAGNOSIS — M75120 Complete rotator cuff tear or rupture of unspecified shoulder, not specified as traumatic: Secondary | ICD-10-CM | POA: Insufficient documentation

## 2016-01-14 ENCOUNTER — Other Ambulatory Visit: Payer: Self-pay | Admitting: Internal Medicine

## 2016-01-14 DIAGNOSIS — M2550 Pain in unspecified joint: Secondary | ICD-10-CM

## 2016-01-21 DIAGNOSIS — J449 Chronic obstructive pulmonary disease, unspecified: Secondary | ICD-10-CM | POA: Diagnosis not present

## 2016-02-02 ENCOUNTER — Ambulatory Visit
Admission: RE | Admit: 2016-02-02 | Discharge: 2016-02-02 | Disposition: A | Discharge status: Home / Self Care | Payer: Medicare Other | Source: Ambulatory Visit | Attending: Internal Medicine | Admitting: Internal Medicine

## 2016-02-02 ENCOUNTER — Encounter
Admission: RE | Admit: 2016-02-02 | Discharge: 2016-02-02 | Disposition: A | Discharge status: Home / Self Care | Payer: Medicare Other | Source: Ambulatory Visit | Attending: Internal Medicine | Admitting: Internal Medicine

## 2016-02-02 DIAGNOSIS — R937 Abnormal findings on diagnostic imaging of other parts of musculoskeletal system: Secondary | ICD-10-CM | POA: Insufficient documentation

## 2016-02-02 DIAGNOSIS — C50919 Malignant neoplasm of unspecified site of unspecified female breast: Secondary | ICD-10-CM | POA: Diagnosis not present

## 2016-02-02 DIAGNOSIS — M2550 Pain in unspecified joint: Secondary | ICD-10-CM | POA: Insufficient documentation

## 2016-02-02 HISTORY — DX: Acquired absence of both cervix and uterus: Z90.710

## 2016-02-02 HISTORY — DX: Other specified postprocedural states: Z98.890

## 2016-02-02 MED ORDER — TECHNETIUM TC 99M MEDRONATE IV KIT
25.0000 | PACK | Freq: Once | INTRAVENOUS | Status: AC | PRN
  Administered 2016-02-02: 20.326 via INTRAVENOUS

## 2016-02-08 ENCOUNTER — Ambulatory Visit: Payer: Medicare Other | Attending: Pain Medicine | Admitting: Pain Medicine

## 2016-02-08 ENCOUNTER — Encounter: Payer: Self-pay | Admitting: Pain Medicine

## 2016-02-08 ENCOUNTER — Other Ambulatory Visit
Admission: RE | Admit: 2016-02-08 | Discharge: 2016-02-08 | Disposition: A | Discharge status: Home / Self Care | Payer: Medicare Other | Source: Ambulatory Visit | Attending: Pain Medicine | Admitting: Pain Medicine

## 2016-02-08 VITALS — BP 146/80 | HR 92 | Temp 97.9°F | Resp 16 | Ht 65.5 in | Wt 220.0 lb

## 2016-02-08 DIAGNOSIS — Z853 Personal history of malignant neoplasm of breast: Secondary | ICD-10-CM | POA: Insufficient documentation

## 2016-02-08 DIAGNOSIS — M160 Bilateral primary osteoarthritis of hip: Secondary | ICD-10-CM

## 2016-02-08 DIAGNOSIS — M545 Low back pain, unspecified: Secondary | ICD-10-CM | POA: Insufficient documentation

## 2016-02-08 DIAGNOSIS — M791 Myalgia, unspecified site: Secondary | ICD-10-CM

## 2016-02-08 DIAGNOSIS — E876 Hypokalemia: Secondary | ICD-10-CM | POA: Diagnosis not present

## 2016-02-08 DIAGNOSIS — I10 Essential (primary) hypertension: Secondary | ICD-10-CM | POA: Insufficient documentation

## 2016-02-08 DIAGNOSIS — Z880 Allergy status to penicillin: Secondary | ICD-10-CM | POA: Diagnosis not present

## 2016-02-08 DIAGNOSIS — M47816 Spondylosis without myelopathy or radiculopathy, lumbar region: Secondary | ICD-10-CM

## 2016-02-08 DIAGNOSIS — F329 Major depressive disorder, single episode, unspecified: Secondary | ICD-10-CM | POA: Diagnosis not present

## 2016-02-08 DIAGNOSIS — Z841 Family history of disorders of kidney and ureter: Secondary | ICD-10-CM | POA: Insufficient documentation

## 2016-02-08 DIAGNOSIS — Z882 Allergy status to sulfonamides status: Secondary | ICD-10-CM | POA: Insufficient documentation

## 2016-02-08 DIAGNOSIS — M5136 Other intervertebral disc degeneration, lumbar region: Secondary | ICD-10-CM | POA: Diagnosis not present

## 2016-02-08 DIAGNOSIS — Z96612 Presence of left artificial shoulder joint: Secondary | ICD-10-CM | POA: Diagnosis not present

## 2016-02-08 DIAGNOSIS — R892 Abnormal level of other drugs, medicaments and biological substances in specimens from other organs, systems and tissues: Secondary | ICD-10-CM

## 2016-02-08 DIAGNOSIS — E559 Vitamin D deficiency, unspecified: Secondary | ICD-10-CM | POA: Diagnosis not present

## 2016-02-08 DIAGNOSIS — M549 Dorsalgia, unspecified: Secondary | ICD-10-CM | POA: Diagnosis not present

## 2016-02-08 DIAGNOSIS — Z79891 Long term (current) use of opiate analgesic: Secondary | ICD-10-CM | POA: Insufficient documentation

## 2016-02-08 DIAGNOSIS — Z96611 Presence of right artificial shoulder joint: Secondary | ICD-10-CM | POA: Diagnosis not present

## 2016-02-08 DIAGNOSIS — M8580 Other specified disorders of bone density and structure, unspecified site: Secondary | ICD-10-CM

## 2016-02-08 DIAGNOSIS — M1288 Other specific arthropathies, not elsewhere classified, other specified site: Secondary | ICD-10-CM

## 2016-02-08 DIAGNOSIS — E785 Hyperlipidemia, unspecified: Secondary | ICD-10-CM | POA: Diagnosis not present

## 2016-02-08 DIAGNOSIS — M25562 Pain in left knee: Secondary | ICD-10-CM

## 2016-02-08 DIAGNOSIS — G894 Chronic pain syndrome: Secondary | ICD-10-CM | POA: Insufficient documentation

## 2016-02-08 DIAGNOSIS — C50912 Malignant neoplasm of unspecified site of left female breast: Secondary | ICD-10-CM | POA: Diagnosis not present

## 2016-02-08 DIAGNOSIS — K219 Gastro-esophageal reflux disease without esophagitis: Secondary | ICD-10-CM | POA: Insufficient documentation

## 2016-02-08 DIAGNOSIS — Z6836 Body mass index (BMI) 36.0-36.9, adult: Secondary | ICD-10-CM | POA: Insufficient documentation

## 2016-02-08 DIAGNOSIS — M25559 Pain in unspecified hip: Secondary | ICD-10-CM

## 2016-02-08 DIAGNOSIS — C50312 Malignant neoplasm of lower-inner quadrant of left female breast: Secondary | ICD-10-CM | POA: Insufficient documentation

## 2016-02-08 DIAGNOSIS — Z885 Allergy status to narcotic agent status: Secondary | ICD-10-CM | POA: Diagnosis not present

## 2016-02-08 DIAGNOSIS — Z888 Allergy status to other drugs, medicaments and biological substances status: Secondary | ICD-10-CM | POA: Insufficient documentation

## 2016-02-08 DIAGNOSIS — Z87891 Personal history of nicotine dependence: Secondary | ICD-10-CM | POA: Diagnosis not present

## 2016-02-08 DIAGNOSIS — M1711 Unilateral primary osteoarthritis, right knee: Secondary | ICD-10-CM | POA: Insufficient documentation

## 2016-02-08 DIAGNOSIS — M75120 Complete rotator cuff tear or rupture of unspecified shoulder, not specified as traumatic: Secondary | ICD-10-CM | POA: Diagnosis not present

## 2016-02-08 DIAGNOSIS — E669 Obesity, unspecified: Secondary | ICD-10-CM | POA: Diagnosis not present

## 2016-02-08 DIAGNOSIS — M159 Polyosteoarthritis, unspecified: Secondary | ICD-10-CM

## 2016-02-08 DIAGNOSIS — Z90710 Acquired absence of both cervix and uterus: Secondary | ICD-10-CM | POA: Insufficient documentation

## 2016-02-08 DIAGNOSIS — M19011 Primary osteoarthritis, right shoulder: Secondary | ICD-10-CM

## 2016-02-08 DIAGNOSIS — M533 Sacrococcygeal disorders, not elsewhere classified: Secondary | ICD-10-CM

## 2016-02-08 DIAGNOSIS — F1190 Opioid use, unspecified, uncomplicated: Secondary | ICD-10-CM

## 2016-02-08 DIAGNOSIS — J449 Chronic obstructive pulmonary disease, unspecified: Secondary | ICD-10-CM | POA: Diagnosis not present

## 2016-02-08 DIAGNOSIS — G8929 Other chronic pain: Secondary | ICD-10-CM | POA: Insufficient documentation

## 2016-02-08 DIAGNOSIS — M150 Primary generalized (osteo)arthritis: Secondary | ICD-10-CM

## 2016-02-08 DIAGNOSIS — Z0189 Encounter for other specified special examinations: Secondary | ICD-10-CM

## 2016-02-08 DIAGNOSIS — Z803 Family history of malignant neoplasm of breast: Secondary | ICD-10-CM | POA: Insufficient documentation

## 2016-02-08 DIAGNOSIS — M25561 Pain in right knee: Secondary | ICD-10-CM

## 2016-02-08 LAB — COMPREHENSIVE METABOLIC PANEL
ALBUMIN: 4.4 g/dL (ref 3.5–5.0)
ALT: 25 U/L (ref 14–54)
ANION GAP: 10 (ref 5–15)
AST: 22 U/L (ref 15–41)
Alkaline Phosphatase: 83 U/L (ref 38–126)
BILIRUBIN TOTAL: 0.9 mg/dL (ref 0.3–1.2)
BUN: 18 mg/dL (ref 6–20)
CALCIUM: 9.3 mg/dL (ref 8.9–10.3)
CO2: 27 mmol/L (ref 22–32)
Chloride: 97 mmol/L — ABNORMAL LOW (ref 101–111)
Creatinine, Ser: 0.86 mg/dL (ref 0.44–1.00)
Glucose, Bld: 111 mg/dL — ABNORMAL HIGH (ref 65–99)
POTASSIUM: 3.3 mmol/L — AB (ref 3.5–5.1)
Sodium: 134 mmol/L — ABNORMAL LOW (ref 135–145)
TOTAL PROTEIN: 7.5 g/dL (ref 6.5–8.1)

## 2016-02-08 LAB — SEDIMENTATION RATE: Sed Rate: 34 mm/hr — ABNORMAL HIGH (ref 0–30)

## 2016-02-08 LAB — CBC WITH DIFFERENTIAL/PLATELET
BASOS PCT: 1 %
Basophils Absolute: 0.1 10*3/uL (ref 0–0.1)
Eosinophils Absolute: 0.1 10*3/uL (ref 0–0.7)
Eosinophils Relative: 1 %
HEMATOCRIT: 41.6 % (ref 35.0–47.0)
Hemoglobin: 14.1 g/dL (ref 12.0–16.0)
LYMPHS ABS: 1.9 10*3/uL (ref 1.0–3.6)
Lymphocytes Relative: 18 %
MCH: 30.2 pg (ref 26.0–34.0)
MCHC: 34 g/dL (ref 32.0–36.0)
MCV: 88.9 fL (ref 80.0–100.0)
MONO ABS: 0.7 10*3/uL (ref 0.2–0.9)
MONOS PCT: 6 %
NEUTROS ABS: 7.8 10*3/uL — AB (ref 1.4–6.5)
Neutrophils Relative %: 74 %
Platelets: 265 10*3/uL (ref 150–440)
RBC: 4.68 MIL/uL (ref 3.80–5.20)
RDW: 13.2 % (ref 11.5–14.5)
WBC: 10.6 10*3/uL (ref 3.6–11.0)

## 2016-02-08 LAB — C-REACTIVE PROTEIN: CRP: 1.7 mg/dL — AB (ref <1.0)

## 2016-02-08 LAB — MAGNESIUM: MAGNESIUM: 1.7 mg/dL (ref 1.7–2.4)

## 2016-02-08 LAB — VITAMIN B12: Vitamin B-12: 965 pg/mL — ABNORMAL HIGH (ref 180–914)

## 2016-02-08 MED ORDER — HYDROCODONE-ACETAMINOPHEN 5-325 MG PO TABS: 0.5000 | ORAL_TABLET | Freq: Four times a day (QID) | ORAL | 0 refills | Status: DC | PRN

## 2016-02-08 NOTE — Progress Notes (Signed)
Patient's Name: Candace Tapia  MRN: [id number]  Referring Provider: Arlis Porta., MD  DOB: 07-08-1935  PCP: Dicky Doe, MD  DOS: 02/08/2016  Note by: Kathlen Brunswick. Dossie Arbour, MD  Service setting: Ambulatory outpatient  Specialty: Interventional Pain Management  Location: [facility] (AMB) Pain Management Facility    Patient type: New Patient   Primary Reason(s) for Visit: Initial Patient Evaluation CC: Back Pain (lower) and Knee Pain (bilaterally)  HPI  Candace Tapia is a 80 y.o. year old, female patient, who comes today for an initial evaluation. She has Cancer of left breast ([place]); Airway hyperreactivity; Hypertension; Chronic pain; Vitamin D deficiency; Abnormal finding on mammography; Back pain with radiation; CFIDS (chronic fatigue and immune dysfunction syndrome) ([place]); Clinical depression; Dry skin; Microscopic hematuria; Muscle ache; Adiposity; Gastro-esophageal reflux disease without esophagitis; Allergic rhinitis, seasonal; Osteoarthritis; DDD (degenerative disc disease), lumbar; Lumbar facet syndrome (Bilateral); Sacroiliac joint dysfunction; Degenerative joint disease (DJD) of hip; DJD of shoulder; COPD (chronic obstructive pulmonary disease) with chronic bronchitis ([place]); Chronic hip pain (Location of Primary Source of Pain) (Bilateral) (R>L); Complete rupture of rotator cuff; Hyperlipidemia, unspecified; Osteopenia of the elderly; Long term current use of opiate analgesic; Long term prescription opiate use; Opiate use; Encounter for pain management planning; Drug level above therapeutic range; Chronic low back pain (Location of Tertiary source of pain) (Bilateral) (R>L); Chronic knee pain (Location of Secondary source of pain) (Bilateral) (L>R); and History bilateral shoulder replacement on her problem list.. Her primarily concern today is the Back Pain (lower) and Knee Pain (bilaterally)  Pain Assessment: Self-Reported Pain Score: 3 /10             Reported level is compatible with  observation.       Pain Descriptors / Indicators: Aching, Discomfort, Sore Pain Frequency: Intermittent  Onset and Duration: Gradual and Date of onset: 7 years ago Cause of pain: Unknown Severity: Getting worse, NAS-11 at its worse: 10/10, NAS-11 at its best: 5/10, NAS-11 now: 7/10 and NAS-11 on the average: 3/10 Timing: Not influenced by the time of the day Aggravating Factors: Motion and Twisting Alleviating Factors: Lying down Associated Problems: Sweating, Swelling and Pain that wakes patient up Quality of Pain: Aching Previous Examinations or Tests: Nerve block and X-rays Previous Treatments: Epidural steroid injections  The patient comes into the clinics today for the first time for a chronic pain management evaluation. The patient indicates her worst pain to be that of the hips with the right side being worst on the left. She denies any prior surgeries or nerve blocks in the area. Following this is her knee pain which is also bilateral with the left being worse than the right. She also denies any prior surgeries or nerve blocks in that area. Finally there is that her low back pain which is worse on the right side compared to the left but this is better since the injections done by Dr. Primus Bravo. The patient denies any surgery in the lumbar region.  Today I took the time to provide the patient with information regarding my pain practice. The patient was informed that my practice is divided into two sections: an interventional pain management section, as well as a completely separate and distinct medication management section. The interventional portion of my practice takes place on Tuesdays and Thursdays, while the medication management is conducted on Mondays and Wednesdays. Because of the amount of documentation required on both them, they are kept separated. This means that there is the possibility that the  patient may be scheduled for a procedure on Tuesday, while also having a medication  management appointment on Wednesday. I have also informed the patient that because of current staffing and facility limitations, I no longer take patients for medication management only. To illustrate the reasons for this, I gave the patient the example of a surgeon and how inappropriate it would be to refer a patient to his/her practice so that they write for the post-procedure antibiotics on a surgery done by someone else.   The patient was informed that joining my practice means that they are open to any and all interventional therapies. I clarified for the patient that this does not mean that they will be forced to have any procedures done. What it means is that patients looking for a practitioner to simply write for their pain medications and not take advantage of other interventional techniques will be better served by a different practitioner, other than myself. I made it clear that I prefer to spend my time providing those services that I specialize in.  The patient was also made aware of my Comprehensive Pain Management Safety Guidelines where by joining my practice, they limit all of their nerve blocks and joint injections to those done by our practice, for as long as we are retained to manage their care.   Historic Controlled Substance Pharmacotherapy Review  Analgesic: Hydrocodone/APAP 5/325 2 tablets per day (10 mg/day of hydrocodone) MME/day: 10 mg/day Medications: The patient did not bring the medication(s) to the appointment, as requested in our "New Patient Package" Pharmacodynamics: Desired effects: Analgesia: The patient reports >50% benefit. Reported improvement in function: The patient reports medication allows her to accomplish basic ADLs. Clinically meaningful improvement in function (CMIF): Sustained CMIF goals met Perceived effectiveness: Described as relatively effective, allowing for increase in activities of daily living (ADL) Undesirable effects: Side-effects or Adverse  reactions: None reported Historical Monitoring: The patient  reports that she does not use drugs.. No results found for: MDMA, COCAINSCRNUR, PCPSCRNUR, THCU, ETH Historical Background Evaluation: [place] PDMP: Five (5) year initial data search conducted. No abnormal patterns identified [place] Department of public safety, offender search: Editor, commissioning Information) Non-contributory Risk Assessment Profile: Aberrant behavior: None observed or detected today Risk factors for fatal opioid overdose: None identified today Fatal overdose hazard ratio (HR): Calculation deferred Non-fatal overdose hazard ratio (HR): Calculation deferred Risk of opioid abuse or dependence: 0.7-3.0% with doses ? 36 MME/day and 6.1-26% with doses ? 120 MME/day. Substance use disorder (SUD) risk level: Pending results of Medical Psychology Evaluation for SUD Opioid risk tool (ORT) (Total Score): 0  ORT Scoring interpretation table:  Score <3 = Low Risk for SUD  Score between 4-7 = Moderate Risk for SUD  Score >8 = High Risk for Opioid Abuse   PHQ-2 Depression Scale:  Total score: 0  PHQ-2 Scoring interpretation table: (Score and probability of major depressive disorder)  Score 0 = No depression  Score 1 = 15.4% Probability  Score 2 = 21.1% Probability  Score 3 = 38.4% Probability  Score 4 = 45.5% Probability  Score 5 = 56.4% Probability  Score 6 = 78.6% Probability   PHQ-9 Depression Scale:  Total score: 0  PHQ-9 Scoring interpretation table:  Score 0-4 = No depression  Score 5-9 = Mild depression  Score 10-14 = Moderate depression  Score 15-19 = Moderately severe depression  Score 20-27 = Severe depression (2.4 times higher risk of SUD and 2.89 times higher risk of overuse)   Pharmacologic Plan: Pending  ordered tests and/or consults  Meds  The patient has a current medication list which includes the following prescription(s): amlodipine, exemestane, fexofenadine, fluticasone, fluticasone, hydrocodone-acetaminophen,  losartan, multivitamin women 50+, nexium, tiotropium, albuterol, calcium gluconate, chlorthalidone, and exemestane.  Current Outpatient Prescriptions on File Prior to Visit  Medication Sig  . amLODipine (NORVASC) 5 MG tablet TAKE ONE (1) TABLET EACH DAY  . exemestane (AROMASIN) 25 MG tablet TAKE ONE TABLET BY MOUTH EVERY DAY  . fexofenadine (ALLEGRA ALLERGY) 180 MG tablet Take by mouth as needed. Reported on 09/29/2015  . fluticasone (FLONASE) 50 MCG/ACT nasal spray Place into both nostrils as needed.   . fluticasone (FLOVENT DISKUS) 50 MCG/BLIST diskus inhaler Place into the nose as needed. Reported on 10/08/2015  . losartan (COZAAR) 100 MG tablet Take 1 tablet (100 mg total) by mouth daily.  . Multiple Vitamins-Minerals (MULTIVITAMIN WOMEN 50+) TABS Take 2 tablets by mouth daily.  Marland Kitchen NEXIUM 20 MG capsule Take 1 capsule (20 mg total) by mouth daily.  Marland Kitchen tiotropium (SPIRIVA HANDIHALER) 18 MCG inhalation capsule Place 18 mcg into inhaler and inhale daily.   Marland Kitchen albuterol (PROAIR HFA) 108 (90 BASE) MCG/ACT inhaler Inhale into the lungs.  . calcium gluconate 650 MG tablet Take 650 mg by mouth daily.  . chlorthalidone (HYGROTON) 25 MG tablet Take 1 tablet (25 mg total) by mouth daily. (Patient not taking: Reported on 02/08/2016)  . exemestane (AROMASIN) 25 MG tablet Take 25 mg by mouth daily after breakfast.    No current facility-administered medications on file prior to visit.    Imaging Review  Lumbosacral Imaging: Lumbar DG Bending views:  Results for orders placed in visit on 07/01/14  DG Lumbar Spine Complete W/Bend   Narrative * PRIOR REPORT IMPORTED FROM AN EXTERNAL SYSTEM *   CLINICAL DATA:  Low back and bilateral hip and leg pain, greater on  the right. The pain has been present for several weeks and is  worsening.   EXAM:  LUMBAR SPINE - COMPLETE WITH BENDING VIEWS   COMPARISON:  Abdomen and pelvis CT dated 08/15/2007.   FINDINGS:  Five non-rib-bearing lumbar vertebrae. Mild  anterior and lateral  spur formation at multiple levels. Facet degenerative changes in the  mid and lower lumbar spine. Normal alignment in the neutral position  and with flexion and extension. No pars defects. Atheromatous  arterial calcifications.   IMPRESSION:  Degenerative changes, as described above.    Electronically Signed    By: Claudie Revering M.D.    On: 07/01/2014 17:32       Knee Imaging: Knee-R DG 4 views:  Results for orders placed in visit on 07/01/14  DG Knee Complete 4 Views Right   Narrative * PRIOR REPORT IMPORTED FROM AN EXTERNAL SYSTEM *   CLINICAL DATA:  Lumbar pain radiating to hips and lower extremities  for weeks but now getting worse with left worse than right.   EXAM:  RIGHT KNEE - COMPLETE 4+ VIEW   COMPARISON:  None.   FINDINGS:  Exam demonstrates mild early degenerative change over the medial  compartments. No evidence of fracture, dislocation or significant  joint effusion.   IMPRESSION:  No acute findings.   Minimal degenerative change of the medial compartments.    Electronically Signed    By: Marin Olp M.D.    On: 07/01/2014 17:31       Note: Imaging results reviewed.  ROS  Cardiovascular History: Hypertension and Needs antibiotics prior to dental procedures Pulmonary or Respiratory History: Lung  problems, Asthma and Snoring  Neurological History: Negative for epilepsy, stroke, urinary or fecal inontinence, spina bifida or tethered cord syndrome Review of Past Neurological Studies: No results found for this or any previous visit. Psychological-Psychiatric History: Negative for anxiety, depression, schizophrenia, bipolar disorders or suicidal ideations or attempts Gastrointestinal History: Reflux or heatburn Genitourinary History: Negative for nephrolithiasis, hematuria, renal failure or chronic kidney disease Hematological History: Negative for anticoagulant therapy, anemia, bruising or bleeding easily, hemophilia, sickle  cell disease or trait, thrombocytopenia or coagulupathies Endocrine History: Negative for diabetes or thyroid disease Rheumatologic History: Negative for lupus, osteoarthritis, rheumatoid arthritis, myositis, polymyositis or fibromyagia Musculoskeletal History: Negative for myasthenia gravis, muscular dystrophy, multiple sclerosis or malignant hyperthermia Work History: Retired  Allergies  Ms. Anspaugh is allergic to tramadol hcl; penicillins; statins; penicillin g; and sulfa antibiotics.  Laboratory Chemistry  Inflammation Markers No results found for: ESRSEDRATE, CRP Renal Function Lab Results  Component Value Date   BUN 20 10/27/2015   CREATININE 1.01 (H) 10/27/2015   GFRAA >60 10/27/2015   GFRNONAA 52 (L) 10/27/2015   Hepatic Function Lab Results  Component Value Date   AST 22 10/27/2015   ALT 24 10/27/2015   ALBUMIN 4.3 10/27/2015   Electrolytes Lab Results  Component Value Date   NA 138 10/27/2015   K 3.9 10/27/2015   CL 98 (L) 10/27/2015   CALCIUM 9.7 10/27/2015   Pain Modulating Vitamins Lab Results  Component Value Date   VD25OH 37.1 06/12/2015   VD125OH2TOT 39 06/12/2015   [id number] 34 06/12/2015   [id number] <10 06/12/2015   Coagulation Parameters Lab Results  Component Value Date   PLT 239 10/27/2015   Cardiovascular Lab Results  Component Value Date   HGB 13.9 10/27/2015   HCT 39.7 10/27/2015   Note: Lab results reviewed.  [place]  Drug: Ms. Deland  reports that she does not use drugs. Alcohol:  reports that she does not drink alcohol. Tobacco:  reports that she has quit smoking. She has never used smokeless tobacco. Medical:  has a past medical history of Allergy; Arthritis; Breast cancer ([place]) (2014); Cancer of left breast ([place]) (10/27/2014); COPD (chronic obstructive pulmonary disease) ([place]); GERD (gastroesophageal reflux disease); H/O: hysterectomy; History of lumpectomy (2014); and Hypertension. Family: family history includes Breast cancer  (age of onset: 77) in her mother and sister; Kidney disease in her father.  Past Surgical History:  Procedure Laterality Date  . ABDOMINAL HYSTERECTOMY    . BREAST SURGERY    . JOINT REPLACEMENT    . LYMPH NODE DISSECTION     neck  . TOTAL SHOULDER REPLACEMENT Bilateral 2010 and 2013   Active Ambulatory Problems    Diagnosis Date Noted  . Cancer of left breast ([place]) 10/27/2014  . Airway hyperreactivity 09/18/2013  . Hypertension 11/28/2014  . Chronic pain 11/28/2014  . Vitamin D deficiency 11/28/2014  . Abnormal finding on mammography 03/03/2015  . Back pain with radiation 03/03/2015  . CFIDS (chronic fatigue and immune dysfunction syndrome) ([place]) 03/03/2015  . Clinical depression 03/03/2015  . Dry skin 03/03/2015  . Microscopic hematuria 12/20/2011  . Muscle ache 03/03/2015  . Adiposity 03/03/2015  . Gastro-esophageal reflux disease without esophagitis 03/03/2015  . Allergic rhinitis, seasonal 03/03/2015  . Osteoarthritis 03/03/2015  . DDD (degenerative disc disease), lumbar 03/05/2015  . Lumbar facet syndrome (Bilateral) 03/05/2015  . Sacroiliac joint dysfunction 03/05/2015  . Degenerative joint disease (DJD) of hip 03/05/2015  . DJD of shoulder 03/05/2015  . COPD (chronic obstructive pulmonary disease) with chronic  bronchitis ([place]) 07/21/2015  . Chronic hip pain (Location of Primary Source of Pain) (Bilateral) (R>L) 01/07/2016  . Complete rupture of rotator cuff 01/07/2016  . Hyperlipidemia, unspecified 01/07/2016  . Osteopenia of the elderly 01/07/2016  . Long term current use of opiate analgesic 02/08/2016  . Long term prescription opiate use 02/08/2016  . Opiate use 02/08/2016  . Encounter for pain management planning 02/08/2016  . Drug level above therapeutic range 02/08/2016  . Chronic low back pain (Location of Tertiary source of pain) (Bilateral) (R>L) 02/08/2016  . Chronic knee pain (Location of Secondary source of pain) (Bilateral) (L>R) 02/08/2016  . History  bilateral shoulder replacement 02/08/2016   Resolved Ambulatory Problems    Diagnosis Date Noted  . COPD, mild ([place]) 09/18/2013  . Decreased potassium in the blood 03/03/2015   Past Medical History:  Diagnosis Date  . Allergy   . Arthritis   . Breast cancer ([place]) 2014  . Cancer of left breast ([place]) 10/27/2014  . COPD (chronic obstructive pulmonary disease) ([place])   . GERD (gastroesophageal reflux disease)   . H/O: hysterectomy   . History of lumpectomy 2014  . Hypertension    Constitutional Exam  General appearance: Well nourished, well developed, and well hydrated. In no apparent acute distress Vitals:   02/08/16 1411  BP: (!) 146/80  Pulse: 92  Resp: 16  Temp: 97.9 F (36.6 C)  TempSrc: Oral  SpO2: 98%  Weight: 220 lb (99.8 kg)  Height: 5' 5.5" (1.664 m)   BMI Assessment: Estimated body mass index is 36.05 kg/m as calculated from the following:   Height as of this encounter: 5' 5.5" (1.664 m).   Weight as of this encounter: 220 lb (99.8 kg).  BMI interpretation table: BMI level Category Range association with higher incidence of chronic pain  <18 kg/m2 Underweight   18.5-24.9 kg/m2 Ideal body weight   25-29.9 kg/m2 Overweight Increased incidence by 20%  30-34.9 kg/m2 Obese (Class I) Increased incidence by 68%  35-39.9 kg/m2 Severe obesity (Class II) Increased incidence by 136%  >40 kg/m2 Extreme obesity (Class III) Increased incidence by 254%   BMI Readings from Last 4 Encounters:  02/08/16 36.05 kg/m  12/28/15 36.05 kg/m  12/01/15 35.78 kg/m  10/29/15 35.94 kg/m   Wt Readings from Last 4 Encounters:  02/08/16 220 lb (99.8 kg)  12/28/15 220 lb (99.8 kg)  12/01/15 215 lb (97.5 kg)  10/29/15 216 lb (98 kg)  Psych/Mental status: Alert, oriented x 3 (person, place, & time) Eyes: PERLA Respiratory: No evidence of acute respiratory distress  Cervical Spine Exam  Inspection: No masses, redness, or swelling Alignment: Symmetrical Functional ROM:  Unrestricted ROM Stability: No instability detected Muscle strength & Tone: Functionally intact Sensory: Unimpaired Palpation: Non-contributory  Upper Extremity (UE) Exam    Side: Right upper extremity  Side: Left upper extremity  Inspection: No masses, redness, swelling, or asymmetry  Inspection: No masses, redness, swelling, or asymmetry  Functional ROM: Unrestricted ROM         Functional ROM: Unrestricted ROM          Muscle strength & Tone: Functionally intact  Muscle strength & Tone: Functionally intact  Sensory: Unimpaired  Sensory: Unimpaired  Palpation: Non-contributory  Palpation: Non-contributory   Thoracic Spine Exam  Inspection: No masses, redness, or swelling Alignment: Symmetrical Functional ROM: Unrestricted ROM Stability: No instability detected Sensory: Unimpaired Muscle strength & Tone: Functionally intact Palpation: Non-contributory  Lumbar Spine Exam  Inspection: No masses, redness, or swelling Alignment: Symmetrical Functional  ROM: Unrestricted ROM Stability: No instability detected Muscle strength & Tone: Functionally intact Sensory: Unimpaired Palpation: Non-contributory Provocative Tests: Lumbar Hyperextension and rotation test: evaluation deferred today       Patrick's Maneuver: evaluation deferred today              Gait & Posture Assessment  Ambulation: Unassisted Gait: Relatively normal for age and body habitus Posture: WNL   Lower Extremity Exam    Side: Right lower extremity  Side: Left lower extremity  Inspection: No masses, redness, swelling, or asymmetry  Inspection: No masses, redness, swelling, or asymmetry  Functional ROM: Unrestricted ROM          Functional ROM: Unrestricted ROM          Muscle strength & Tone: Functionally intact  Muscle strength & Tone: Functionally intact  Sensory: Unimpaired  Sensory: Unimpaired  Palpation: Non-contributory  Palpation: Non-contributory   Assessment  Primary Diagnosis & Pertinent Problem  List: The primary encounter diagnosis was Chronic pain syndrome. Diagnoses of Back pain with radiation, Bilateral hip pain, Malignant neoplasm of left female breast, unspecified estrogen receptor status, unspecified site of breast ([place]), Complete tear of rotator cuff, unspecified laterality, DDD (degenerative disc disease), lumbar, Primary osteoarthritis of both hips, Primary osteoarthritis of right shoulder, Facet syndrome, lumbar, Muscle ache, Primary osteoarthritis involving multiple joints, Sacroiliac joint dysfunction, Long term current use of opiate analgesic, Long term prescription opiate use, Opiate use, Encounter for pain management planning, Drug level above therapeutic range, Vitamin D deficiency, Osteopenia of the elderly, Chronic low back pain (Bilateral), Chronic knee pain (Location of Secondary source of pain) (Bilateral) (L>R), and History bilateral shoulder replacement were also pertinent to this visit.  Visit Diagnosis: 1. Chronic pain syndrome   2. Back pain with radiation   3. Bilateral hip pain   4. Malignant neoplasm of left female breast, unspecified estrogen receptor status, unspecified site of breast ([name])   5. Complete tear of rotator cuff, unspecified laterality   6. DDD (degenerative disc disease), lumbar   7. Primary osteoarthritis of both hips   8. Primary osteoarthritis of right shoulder   9. Facet syndrome, lumbar   10. Muscle ache   11. Primary osteoarthritis involving multiple joints   12. Sacroiliac joint dysfunction   13. Long term current use of opiate analgesic   14. Long term prescription opiate use   15. Opiate use   16. Encounter for pain management planning   17. Drug level above therapeutic range   18. Vitamin D deficiency   19. Osteopenia of the elderly   20. Chronic low back pain (Bilateral)   21. Chronic knee pain (Location of Secondary source of pain) (Bilateral) (L>R)   22. History bilateral shoulder replacement    Plan of Care  Initial  Treatment Plan:  Today we have taken over the patient's medication regimen since she was up prior client of this facility.  Problem-Specific Plan: No problem-specific Assessment & Plan notes found for this encounter.  Ordered Lab-work, Procedure(s), Referral(s), & Consult(s): Orders Placed This Encounter  Procedures  . Compliance Drug Analysis, Ur  . Comprehensive metabolic panel  . C-reactive protein  . Magnesium  . Sedimentation rate  . Vitamin B12  . 25-Hydroxyvitamin D Lcms D2+D3  . Ambulatory referral to Psychology   Pharmacotherapy: Medications ordered:  Meds ordered this encounter  Medications  . HYDROcodone-acetaminophen (NORCO/VICODIN) 5-325 MG tablet    Sig: Take 0.5-1 tablets by mouth every 6 (six) hours as needed for severe pain.  Dispense:  60 tablet    Refill:  0    Do not add this medication to the electronic "Automatic Refill" notification system. Patient may have prescription filled one day early if pharmacy is closed on scheduled refill date. Do not fill until: 02/08/16 To last until: 03/09/16   Medications administered during this visit: Ms. Nghiem had no medications administered during this visit.   Pharmacotherapy under consideration:  Opioid Analgesics: The patient was informed that there is no guarantee that she would be a candidate for opioid analgesics. The decision will be made following CDC guidelines. This decision will be based on the results of diagnostic studies, as well as Ms. Winterton's risk profile.    Interventional therapies under consideration: The patient was informed that there is no guarantee that she would be a candidate for interventional therapies. The decision will be based on the results of diagnostic studies, as well as Ms. Travaglini's risk profile. Diagnostic bilateral under fluoroscopic guidance, with or without sedation. Possible bilateral intra-articular knee joint injection without fluoroscopy or IV sedation. Possible diagnostic  bilateral lumbar facet block under fluoroscopic guidance and IV sedation. Possible lumbar epidural steroid injections under fluoroscopic guidance, with or without sedation.    Requested PM Follow-up: Return in about 1 month (around 03/10/2016) for 2nd Visit Eval, Med-Mgmt.  Future Appointments Date Time Provider [place]  02/09/2016 1:20 PM Arlis Porta., MD [facility] None  03/15/2016 9:45 AM Milinda Pointer, MD [facility] None  04/28/2016 9:45 AM [facility] None  04/28/2016 10:00 AM Lequita Asal, MD [place] None    Primary Care Physician: Dicky Doe, MD Location: [facility] Management Facility Note by: Kathlen Brunswick. Dossie Arbour, M.D, DABA, DABAPM, DABPM, DABIPP, FIPP  Pain Score Disclaimer: We use the NRS-11 scale. This is a self-reported, subjective measurement of pain severity with only modest accuracy. It is used primarily to identify changes within a particular patient. It must be understood that outpatient pain scales are significantly less accurate that those used for research, where they can be applied under ideal controlled circumstances with minimal exposure to variables. In reality, the score is likely to be a combination of pain intensity and pain affect, where pain affect describes the degree of emotional arousal or changes in action readiness caused by the sensory experience of pain. Factors such as social and work situation, setting, emotional state, anxiety levels, expectation, and prior pain experience may influence pain perception and show large inter-individual differences that may also be affected by time variables.  Patient instructions provided during this appointment: Patient Instructions  Pain Management Discharge Instructions  General Discharge Instructions :  If you need to reach your doctor call: Monday-Friday 8:00 am - 4:00 pm at [phone] or toll free [phone].  After clinic hours [phone] to have  operator reach doctor.  Bring all of your medication bottles to all your appointments in the pain clinic.  To cancel or reschedule your appointment with Pain Management please remember to call 24 hours in advance to avoid a fee.  Refer to the educational materials which you have been given on: General Risks, I had my Procedure. Discharge Instructions, Post Sedation.  Post Procedure Instructions:  The drugs you were given will stay in your system until tomorrow, so for the next 24 hours you should not drive, make any legal decisions or drink any alcoholic beverages.  You may eat anything you prefer, but it is better to start with liquids then soups and crackers, and gradually work  up to solid foods.  Please notify your doctor immediately if you have any unusual bleeding, trouble breathing or pain that is not related to your normal pain.  Depending on the type of procedure that was done, some parts of your body may feel week and/or numb.  This usually clears up by tonight or the next day.  Walk with the use of an assistive device or accompanied by an adult for the 24 hours.  You may use ice on the affected area for the first 24 hours.  Put ice in a Ziploc bag and cover with a towel and place against area 15 minutes on 15 minutes off.  You may switch to heat after 24 hours.

## 2016-02-08 NOTE — Patient Instructions (Signed)

## 2016-02-08 NOTE — Progress Notes (Signed)
Safety precautions to be maintained throughout the outpatient stay will include: orient to surroundings, keep bed in low position, maintain call bell within reach at all times, provide assistance with transfer out of bed and ambulation.  

## 2016-02-09 ENCOUNTER — Ambulatory Visit (INDEPENDENT_AMBULATORY_CARE_PROVIDER_SITE_OTHER): Payer: Medicare Other | Admitting: Family Medicine

## 2016-02-09 ENCOUNTER — Encounter: Payer: Self-pay | Admitting: Family Medicine

## 2016-02-09 VITALS — BP 129/81 | HR 80 | Temp 97.8°F | Resp 16 | Ht 65.0 in | Wt 217.0 lb

## 2016-02-09 DIAGNOSIS — I10 Essential (primary) hypertension: Secondary | ICD-10-CM

## 2016-02-09 DIAGNOSIS — J449 Chronic obstructive pulmonary disease, unspecified: Secondary | ICD-10-CM | POA: Diagnosis not present

## 2016-02-09 DIAGNOSIS — Z23 Encounter for immunization: Secondary | ICD-10-CM

## 2016-02-09 DIAGNOSIS — Z853 Personal history of malignant neoplasm of breast: Secondary | ICD-10-CM | POA: Insufficient documentation

## 2016-02-09 DIAGNOSIS — G894 Chronic pain syndrome: Secondary | ICD-10-CM | POA: Diagnosis not present

## 2016-02-09 DIAGNOSIS — R0989 Other specified symptoms and signs involving the circulatory and respiratory systems: Secondary | ICD-10-CM | POA: Diagnosis not present

## 2016-02-09 DIAGNOSIS — K219 Gastro-esophageal reflux disease without esophagitis: Secondary | ICD-10-CM | POA: Diagnosis not present

## 2016-02-09 NOTE — Progress Notes (Signed)
Name: Candace Tapia   MRN: [id number]    DOB: [birth year]   Date:02/09/2016       Progress Note  Subjective  Chief Complaint  Chief Complaint  Patient presents with  . Hypertension    HPI Here for f/u of HBP.  She also has COPD,GERD, and arthritis.  She has trouble breathing with much activity.  She is taking meds.   No problem-specific Assessment & Plan notes found for this encounter.   Past Medical History:  Diagnosis Date  . Allergy   . Arthritis   . Breast cancer ([place]) 2014   radiation and taking exemestane  . Cancer of left breast ([place]) 10/27/2014  . COPD (chronic obstructive pulmonary disease) ([place])   . GERD (gastroesophageal reflux disease)   . H/O: hysterectomy   . History of lumpectomy 2014   LT breast  . Hypertension     Past Surgical History:  Procedure Laterality Date  . ABDOMINAL HYSTERECTOMY    . BREAST SURGERY    . JOINT REPLACEMENT    . LYMPH NODE DISSECTION     neck  . TOTAL SHOULDER REPLACEMENT Bilateral 2010 and 2013    Family History  Problem Relation Age of Onset  . Breast cancer Mother 20  . Kidney disease Father   . Breast cancer Sister 51    Social History   Social History  . Marital status: Widowed    Spouse name: N/A  . Number of children: N/A  . Years of education: N/A   Occupational History  . Not on file.   Social History Main Topics  . Smoking status: Former Research scientist (life sciences)  . Smokeless tobacco: Never Used  . Alcohol use No  . Drug use: No  . Sexual activity: Not on file   Other Topics Concern  . Not on file   Social History Narrative  . No narrative on file     Current Outpatient Prescriptions:  .  albuterol (PROAIR HFA) 108 (90 BASE) MCG/ACT inhaler, Inhale into the lungs., Disp: , Rfl:  .  amLODipine (NORVASC) 5 MG tablet, TAKE ONE (1) TABLET EACH DAY, Disp: 90 tablet, Rfl: 3 .  calcium gluconate 650 MG tablet, Take 650 mg by mouth daily., Disp: , Rfl:  .  chlorthalidone (HYGROTON) 25 MG tablet, Take 1 tablet (25  mg total) by mouth daily., Disp: 30 tablet, Rfl: 6 .  exemestane (AROMASIN) 25 MG tablet, TAKE ONE TABLET BY MOUTH EVERY DAY, Disp: 90 tablet, Rfl: 1 .  fexofenadine (ALLEGRA ALLERGY) 180 MG tablet, Take by mouth as needed. Reported on 09/29/2015, Disp: , Rfl:  .  fluticasone (FLONASE) 50 MCG/ACT nasal spray, Place into both nostrils as needed. , Disp: , Rfl:  .  HYDROcodone-acetaminophen (NORCO/VICODIN) 5-325 MG tablet, Take 0.5-1 tablets by mouth every 6 (six) hours as needed for severe pain., Disp: 60 tablet, Rfl: 0 .  losartan (COZAAR) 100 MG tablet, Take 1 tablet (100 mg total) by mouth daily., Disp: 90 tablet, Rfl: 3 .  Multiple Vitamins-Minerals (MULTIVITAMIN WOMEN 50+) TABS, Take 2 tablets by mouth daily., Disp: , Rfl:  .  NEXIUM 20 MG capsule, Take 1 capsule (20 mg total) by mouth daily., Disp: 90 capsule, Rfl: 3 .  tiotropium (SPIRIVA HANDIHALER) 18 MCG inhalation capsule, Place 18 mcg into inhaler and inhale daily. , Disp: , Rfl:   Allergies  Allergen Reactions  . Tramadol Hcl Itching    Per pt it caused me to itch all over  . Penicillins Other (See Comments)  .  Statins     Not sure what exact med was; just made her feel bad (can't elaborate)  . Penicillin G Rash  . Sulfa Antibiotics Other (See Comments) and Rash     Review of Systems  Constitutional: Positive for malaise/fatigue. Negative for chills, fever and weight loss.  HENT: Negative for hearing loss.   Eyes: Negative for blurred vision and double vision.  Respiratory: Positive for shortness of breath (with al;most any activity). Negative for cough and wheezing.   Cardiovascular: Negative for chest pain, palpitations and leg swelling.  Gastrointestinal: Negative for abdominal pain, blood in stool and heartburn.  Genitourinary: Negative for dysuria, hematuria and urgency.  Musculoskeletal: Positive for back pain and joint pain. Negative for myalgias.  Skin: Negative for rash.  Neurological: Negative for dizziness,  tremors, weakness and headaches.      Objective  Vitals:   02/09/16 1316  BP: 129/81  Pulse: 80  Resp: 16  Temp: 97.8 F (36.6 C)  TempSrc: Oral  Weight: 217 lb (98.4 kg)  Height: _0  (1.651 m)    Physical Exam  Constitutional: She is oriented to person, place, and time and well-developed, well-nourished, and in no distress. No distress.  HENT:  Head: Normocephalic and atraumatic.  Eyes: Conjunctivae and EOM are normal. Pupils are equal, round, and reactive to light. No scleral icterus.  Neck: Normal range of motion. Neck supple. Carotid bruit is present. No thyromegaly present.  L carotid bruit  Cardiovascular: Normal rate and regular rhythm.  Exam reveals no gallop and no friction rub.   Murmur heard.  Systolic murmur is present with a grade of 2/6  ULSB  Pulmonary/Chest: Effort normal and breath sounds normal. No respiratory distress. She has no wheezes. She has no rales.  Musculoskeletal: She exhibits edema (1+ edema L ankle; trace edema R ankle).  Lymphadenopathy:    She has no cervical adenopathy.  Neurological: She is alert and oriented to person, place, and time.  Vitals reviewed.      Recent Results (from the past 2160 hour(s))  CBC with Differential/Platelet     Status: Abnormal   Collection Time: 02/08/16  3:56 PM  Result Value Ref Range   WBC 10.6 3.6 - 11.0 K/uL   RBC 4.68 3.80 - 5.20 MIL/uL   Hemoglobin 14.1 12.0 - 16.0 g/dL   HCT 41.6 35.0 - 47.0 %   MCV 88.9 80.0 - 100.0 fL   MCH 30.2 26.0 - 34.0 pg   MCHC 34.0 32.0 - 36.0 g/dL   RDW 13.2 11.5 - 14.5 %   Platelets 265 150 - 440 K/uL   Neutrophils Relative % 74 %   Neutro Abs 7.8 (H) 1.4 - 6.5 K/uL   Lymphocytes Relative 18 %   Lymphs Abs 1.9 1.0 - 3.6 K/uL   Monocytes Relative 6 %   Monocytes Absolute 0.7 0.2 - 0.9 K/uL   Eosinophils Relative 1 %   Eosinophils Absolute 0.1 0 - 0.7 K/uL   Basophils Relative 1 %   Basophils Absolute 0.1 0 - 0.1 K/uL  Comprehensive metabolic panel      Status: Abnormal   Collection Time: 02/08/16  3:56 PM  Result Value Ref Range   Sodium 134 (L) 135 - 145 mmol/L   Potassium 3.3 (L) 3.5 - 5.1 mmol/L   Chloride 97 (L) 101 - 111 mmol/L   CO2 27 22 - 32 mmol/L   Glucose, Bld 111 (H) 65 - 99 mg/dL   BUN 18 6 - 20 mg/dL  Creatinine, Ser 0.86 0.44 - 1.00 mg/dL   Calcium 9.3 8.9 - 10.3 mg/dL   Total Protein 7.5 6.5 - 8.1 g/dL   Albumin 4.4 3.5 - 5.0 g/dL   AST 22 15 - 41 U/L   ALT 25 14 - 54 U/L   Alkaline Phosphatase 83 38 - 126 U/L   Total Bilirubin 0.9 0.3 - 1.2 mg/dL   GFR calc non Af Amer >60 >60 mL/min   GFR calc Af Amer >60 >60 mL/min    Comment: (NOTE) The eGFR has been calculated using the CKD EPI equation. This calculation has not been validated in all clinical situations. eGFR's persistently <60 mL/min signify possible Chronic Kidney Disease.    Anion gap 10 5 - 15  C-reactive protein     Status: Abnormal   Collection Time: 02/08/16  3:56 PM  Result Value Ref Range   CRP 1.7 (H) <1.0 mg/dL    Comment: Performed at [facility]  Magnesium     Status: None   Collection Time: 02/08/16  3:56 PM  Result Value Ref Range   Magnesium 1.7 1.7 - 2.4 mg/dL  Sedimentation rate     Status: Abnormal   Collection Time: 02/08/16  3:56 PM  Result Value Ref Range   Sed Rate 34 (H) 0 - 30 mm/hr  Vitamin B12     Status: Abnormal   Collection Time: 02/08/16  3:56 PM  Result Value Ref Range   Vitamin B-12 965 (H) 180 - 914 pg/mL    Comment: (NOTE) This assay is not validated for testing neonatal or myeloproliferative syndrome specimens for Vitamin B12 levels. Performed at [place]  Problem List Items Addressed This Visit      Cardiovascular and Mediastinum   Hypertension - Primary     Respiratory   COPD (chronic obstructive pulmonary disease) with chronic bronchitis (HCC)     Digestive   Gastro-esophageal reflux disease without esophagitis     Other   Chronic pain (Chronic)    Carotid bruit   Relevant Orders   Ambulatory referral to Vascular Surgery    Other Visit Diagnoses    Immunization due       Relevant Orders   Flu vaccine HIGH DOSE PF (Fluzone High dose)      No orders of the defined types were placed in this encounter. 1. Essential hypertension Cont meds  2. COPD (chronic obstructive pulmonary disease) with chronic bronchitis (HCC) Cont meds  3. Gastro-esophageal reflux disease without esophagitis Cont med  4. Chronic pain syndrome   5. Bruit of left carotid artery  - Ambulatory referral to Vascular Surgery  6. Immunization due  High dose flu shot

## 2016-02-10 ENCOUNTER — Other Ambulatory Visit: Payer: Self-pay | Admitting: Family Medicine

## 2016-02-10 DIAGNOSIS — E876 Hypokalemia: Secondary | ICD-10-CM

## 2016-02-10 DIAGNOSIS — E785 Hyperlipidemia, unspecified: Secondary | ICD-10-CM | POA: Diagnosis not present

## 2016-02-10 DIAGNOSIS — M159 Polyosteoarthritis, unspecified: Secondary | ICD-10-CM | POA: Diagnosis not present

## 2016-02-10 DIAGNOSIS — I10 Essential (primary) hypertension: Secondary | ICD-10-CM | POA: Diagnosis not present

## 2016-02-10 DIAGNOSIS — M75120 Complete rotator cuff tear or rupture of unspecified shoulder, not specified as traumatic: Secondary | ICD-10-CM | POA: Diagnosis not present

## 2016-02-10 MED ORDER — POTASSIUM CHLORIDE CRYS ER 20 MEQ PO TBCR: 20.0000 meq | EXTENDED_RELEASE_TABLET | Freq: Every day | ORAL | 6 refills | Status: DC

## 2016-02-11 ENCOUNTER — Telehealth: Payer: Self-pay | Admitting: *Deleted

## 2016-02-11 LAB — 25-HYDROXY VITAMIN D LCMS D2+D3
25-Hydroxy, Vitamin D-2: 3.4 ng/mL
25-Hydroxy, Vitamin D-3: 32 ng/mL
25-Hydroxy, Vitamin D: 35 ng/mL

## 2016-02-11 NOTE — Telephone Encounter (Signed)
Patient picked up K+ from pharmacy. She says pill is to large for her to swallow. Please advise.

## 2016-02-11 NOTE — Telephone Encounter (Signed)
We can call in powder K+ but it tastes terrible.  Try to swallow it while she is eating some pudding or yogurt.-jh

## 2016-02-11 NOTE — Telephone Encounter (Signed)
Patient will try with yogurt and let us know.

## 2016-02-14 NOTE — Progress Notes (Signed)
-  A normal sedimentation rate should be below 30 mm/hr. The sed rate is an acute phase reactant that indirectly measures the degree of inflammation present in the body. It can be acute, developing rapidly after trauma, injury or infection, for example, or can occur over an extended time (chronic) with conditions such as autoimmune diseases or cancer. The ESR is not diagnostic; it is a non-specific, screening test that may be elevated in a number of these different conditions. It provides general information about the presence or absence of an inflammatory condition.   Normal CRP (C-reactive protein) Level(s): Less than <1.0 mg/dL. Elevated level(s): Levels above 1.0 mg/dL. Clinical significance: C-reactive protein (CRP) is produced by the liver. The level of CRP rises when there is inflammation throughout the body. CRP goes up in response to inflammation. High levels suggests the presence of chronic inflammation but do not identify its location or cause. Drops of previously elevated levels suggest that the inflammation or infection is subsiding and/or responding to treatment. Possible causes: High levels have been observed in obese patients, individuals with bacterial infections, chronic inflammation, or flare-ups of inflammatory conditions. Recommendations: - Consider the use of anti-inflammatory diet and medications. - The combined elevation of the ESR & CRP, may be suggestive of an autoimmune disease. Patients with elevated levels of both should consider an evaluation by a Rheumatologist.

## 2016-02-14 NOTE — Progress Notes (Signed)
Normal Vitamin B-12 level(s): are between 180 and 914 pg/mL.  Elevated Vit B-12 level(s): Levels above 914 pg/mL. Possible causes: Taking supplements of vitamin B-12 (cobalamin). Medical conditions that can increase levels of vitamin B12 include: liver disease, kidney failure and myeloproliferative disorders, which includes myelocytic leukemia and polycythemia vera. Recommendations: Stop vitamin B-12 supplements. Contact primary care physician for further evaluation and recommendations. 

## 2016-02-15 LAB — COMPLIANCE DRUG ANALYSIS, UR

## 2016-02-21 DIAGNOSIS — J449 Chronic obstructive pulmonary disease, unspecified: Secondary | ICD-10-CM | POA: Diagnosis not present

## 2016-03-01 ENCOUNTER — Other Ambulatory Visit: Payer: Medicare Other

## 2016-03-01 ENCOUNTER — Other Ambulatory Visit: Payer: Self-pay | Admitting: *Deleted

## 2016-03-01 DIAGNOSIS — E876 Hypokalemia: Secondary | ICD-10-CM | POA: Diagnosis not present

## 2016-03-02 LAB — BASIC METABOLIC PANEL
BUN: 12 mg/dL (ref 7–25)
CALCIUM: 9.4 mg/dL (ref 8.6–10.4)
CO2: 29 mmol/L (ref 20–31)
CREATININE: 0.75 mg/dL (ref 0.60–0.93)
Chloride: 97 mmol/L — ABNORMAL LOW (ref 98–110)
Glucose, Bld: 90 mg/dL (ref 65–99)
Potassium: 3.6 mmol/L (ref 3.5–5.3)
Sodium: 135 mmol/L (ref 135–146)

## 2016-03-04 ENCOUNTER — Ambulatory Visit (INDEPENDENT_AMBULATORY_CARE_PROVIDER_SITE_OTHER): Payer: Medicare Other | Admitting: Vascular Surgery

## 2016-03-04 ENCOUNTER — Encounter (INDEPENDENT_AMBULATORY_CARE_PROVIDER_SITE_OTHER): Payer: Self-pay | Admitting: Vascular Surgery

## 2016-03-04 VITALS — BP 185/84 | HR 77 | Resp 18 | Ht 65.5 in | Wt 216.0 lb

## 2016-03-04 DIAGNOSIS — Z853 Personal history of malignant neoplasm of breast: Secondary | ICD-10-CM | POA: Diagnosis not present

## 2016-03-04 DIAGNOSIS — J449 Chronic obstructive pulmonary disease, unspecified: Secondary | ICD-10-CM | POA: Diagnosis not present

## 2016-03-04 DIAGNOSIS — I10 Essential (primary) hypertension: Secondary | ICD-10-CM | POA: Diagnosis not present

## 2016-03-04 DIAGNOSIS — R0989 Other specified symptoms and signs involving the circulatory and respiratory systems: Secondary | ICD-10-CM

## 2016-03-04 DIAGNOSIS — G894 Chronic pain syndrome: Secondary | ICD-10-CM | POA: Diagnosis not present

## 2016-03-04 NOTE — Assessment & Plan Note (Signed)
blood pressure control important in reducing the progression of atherosclerotic disease. On appropriate oral medications.  

## 2016-03-04 NOTE — Assessment & Plan Note (Signed)
On medicines which she says has a blood thinner in it (likely an anti-inflammatory). Not interested in adding ASA at this time.

## 2016-03-04 NOTE — Patient Instructions (Signed)

## 2016-03-04 NOTE — Assessment & Plan Note (Signed)
The patient has an asymptomatic carotid bruit. Given her multiple atherosclerotic risk factors and her intolerance to statins, significant carotid artery disease could certainly be present. I would recommend a carotid duplex be performed at her convenience. We have discussed the pathophysiology and natural history of carotid artery disease and why it is important in the formation of strokes. The patient will have her carotid duplex and I will see her back following the study to discuss the results and determine further treatment options. I will hold off on the addition of aspirin until we get this at the patient's request. She is intolerant to statins.

## 2016-03-04 NOTE — Progress Notes (Signed)
Patient ID: Candace Tapia, female   DOB: [birth year], 80 y.o.   MRN: [id number]  Chief Complaint  Patient presents with  . New Evaluation    Possible blockage in neck    HPI Candace Tapia is a 80 y.o. female.  I am asked to see the patient by Dr. Luan Pulling for evaluation of a carotid bruit.  The patient reports no symptoms related to cerebrovascular disease. Specifically, the patient denies amaurosis fugax, speech or swallowing difficulties, or arm or leg weakness or numbness.  She has no previous history of stroke or TIA to her knowledge. This was an incidental finding on a recent wellness exam and the patient's had no carotid assessment to her knowledge previously. She does not currently take aspirin she says she has blood thinners and some of her pain medications.   Past Medical History:  Diagnosis Date  . Allergy   . Arthritis   . Breast cancer ([place]) 2014   radiation and taking exemestane  . Cancer of left breast ([place]) 10/27/2014  . COPD (chronic obstructive pulmonary disease) ([place])   . GERD (gastroesophageal reflux disease)   . H/O: hysterectomy   . History of lumpectomy 2014   LT breast  . Hypertension     Past Surgical History:  Procedure Laterality Date  . ABDOMINAL HYSTERECTOMY    . BREAST SURGERY    . JOINT REPLACEMENT    . LYMPH NODE DISSECTION     neck  . TOTAL SHOULDER REPLACEMENT Bilateral 2010 and 2013    Family History  Problem Relation Age of Onset  . Breast cancer Mother 56  . Kidney disease Father   . Breast cancer Sister 8  No bleeding or clotting disorders  Social History Social History  Substance Use Topics  . Smoking status: Former Research scientist (life sciences)  . Smokeless tobacco: Never Used  . Alcohol use No  No IV drug use  Allergies  Allergen Reactions  . Tramadol Hcl Itching    Per pt it caused me to itch all over  . Penicillins Other (See Comments)  . Statins     Not sure what exact med was; just made her feel bad (can't elaborate)  .  Penicillin G Rash  . Sulfa Antibiotics Other (See Comments) and Rash    Current Outpatient Prescriptions  Medication Sig Dispense Refill  . albuterol (PROAIR HFA) 108 (90 BASE) MCG/ACT inhaler Inhale into the lungs.    Marland Kitchen amLODipine (NORVASC) 5 MG tablet TAKE ONE (1) TABLET EACH DAY 90 tablet 3  . calcium gluconate 650 MG tablet Take 650 mg by mouth daily.    . chlorthalidone (HYGROTON) 25 MG tablet Take 1 tablet (25 mg total) by mouth daily. 30 tablet 6  . exemestane (AROMASIN) 25 MG tablet TAKE ONE TABLET BY MOUTH EVERY DAY 90 tablet 1  . fexofenadine (ALLEGRA ALLERGY) 180 MG tablet Take by mouth as needed. Reported on 09/29/2015    . fluticasone (FLONASE) 50 MCG/ACT nasal spray Place into both nostrils as needed.     Marland Kitchen HYDROcodone-acetaminophen (NORCO/VICODIN) 5-325 MG tablet Take 0.5-1 tablets by mouth every 6 (six) hours as needed for severe pain. 60 tablet 0  . losartan (COZAAR) 100 MG tablet Take 1 tablet (100 mg total) by mouth daily. 90 tablet 3  . Multiple Vitamins-Minerals (MULTIVITAMIN WOMEN 50+) TABS Take 2 tablets by mouth daily.    Marland Kitchen NEXIUM 20 MG capsule Take 1 capsule (20 mg total) by mouth daily. 90 capsule 3  . potassium  chloride SA (K-DUR,KLOR-CON) 20 MEQ tablet Take 1 tablet (20 mEq total) by mouth daily. 30 tablet 6  . tiotropium (SPIRIVA HANDIHALER) 18 MCG inhalation capsule Place 18 mcg into inhaler and inhale daily.      No current facility-administered medications for this visit.       REVIEW OF SYSTEMS (Negative unless checked)  Constitutional: _0 Weight loss  _1 Fever  _2 Chills Cardiac: _3 Chest pain   _4 Chest pressure   _5 Palpitations   _6 Shortness of breath when laying flat   _7 Shortness of breath at rest   _8 Shortness of breath with exertion. Vascular:  _9 Pain in legs with walking   _10 Pain in legs at rest   _11 Pain in legs when laying flat   _12 Claudication   _13 Pain in feet when walking  _14 Pain in feet at rest  _15 Pain in feet when laying flat   _16 History of DVT    _17 Phlebitis   _18 Swelling in legs   _19 Varicose veins   _20 Non-healing ulcers Pulmonary:   _21 Uses home oxygen   _22 Productive cough   _23 Hemoptysis   _24 Wheeze  _25 COPD   _26 Asthma Neurologic:  _27 Dizziness  _28 Blackouts   _29 Seizures   _30 History of stroke   _31 History of TIA  _32 Aphasia   _33 Temporary blindness   _34 Dysphagia   _35 Weakness or numbness in arms   _36 Weakness or numbness in legs Musculoskeletal:  _37 Arthritis   _38 Joint swelling   _39 Joint pain   _40 Low back pain Hematologic:  _41 Easy bruising  _42 Easy bleeding   _43 Hypercoagulable state   _44 Anemic  _45 Hepatitis Gastrointestinal:  _46 Blood in stool   _47 Vomiting blood  _48 Gastroesophageal reflux/heartburn   _49 Abdominal pain Genitourinary:  _50 Chronic kidney disease   _51 Difficult urination  _52 Frequent urination  _53 Burning with urination   _54 Hematuria Skin:  _55 Rashes   _56 Ulcers   _57 Wounds Psychological:  _58 History of anxiety   _59  History of major depression.    Physical Exam BP (!) 185/84 (BP Location: Right Arm)   Pulse 77   Resp 18   Ht 5' 5.5" (1.664 m)   Wt 216 lb (98 kg)   BMI 35.40 kg/m  Gen:  WD/WN, NAD Head: [place]/AT, No temporalis wasting. Prominent temp pulse not noted. Ear/Nose/Throat: Hearing grossly intact, nares w/o erythema or drainage, oropharynx w/o Erythema/Exudate Eyes: Conjunctiva clear, sclera non-icteric  Neck: trachea midline.  No JVD. Left carotid bruit is present Pulmonary:  Good air movement, clear to auscultation bilaterally.  Cardiac: RRR, normal S1, S2, systolic murmur is present Vascular:  Vessel Right Left  Radial Palpable Palpable  Ulnar Palpable Palpable  Brachial Palpable Palpable  Carotid Palpable, without bruit Palpable, with bruit  Aorta Not palpable N/A  Femoral Palpable Palpable  Popliteal Palpable Palpable  PT Palpable Palpable  DP Palpable Palpable   Gastrointestinal: soft, non-tender/non-distended. No guarding/reflex. No masses, surgical incisions, or scars. Musculoskeletal: M/S 5/5 throughout.   Extremities without ischemic changes.  No deformity or atrophy. Scattered varicosities bilaterally Neurologic: Sensation grossly intact in extremities.  Symmetrical.  Speech is fluent. Motor exam as listed above. Psychiatric: Judgment intact, Mood & affect appropriate for pt's clinical situation. Dermatologic: No rashes or ulcers noted.  No cellulitis or open wounds. Lymph : No Cervical, Axillary, or Inguinal lymphadenopathy.   Radiology No results found.  Labs Recent Results (from the past 2160 hour(s))  Compliance Drug Analysis, Ur     Status: None   Collection Time: 02/08/16  3:07 PM  Result Value Ref Range   Summary FINAL     Comment: ==================================================================== [facility] COMP DRUG ANALYSIS,UR ==================================================================== Test  Result       Flag       Units Drug Present and Declared for Prescription Verification   Hydrocodone                    394          EXPECTED   ng/mg creat   Hydromorphone                  176          EXPECTED   ng/mg creat   Dihydrocodeine                 83           EXPECTED   ng/mg creat   Norhydrocodone                 605          EXPECTED   ng/mg creat    Sources of hydrocodone include scheduled prescription    medications. Hydromorphone, dihydrocodeine and norhydrocodone are    expected metabolites of hydrocodone. Hydromorphone and    dihydrocodeine are also available as scheduled prescription    medications.   Acetaminophen                  PRESENT      EXPECTED Drug Present not Declared for Prescription Verification   Naproxen                       PRESENT       UNEXPECTED   Diphenhydramine                PRESENT      UNEXPECTED ==================================================================== Test                      Result    Flag   Units      Ref Range   Creatinine              128              mg/dL       >=20 ==================================================================== Declared Medications:  The flagging and interpretation on this report are based on the  following declared medications.  Unexpected results may arise from  inaccuracies in the declared medications.  **Note: The testing scope of this panel includes these medications:  Hydrocodone (Norco)  **Note: The testing scope of this panel does not include small to  moderate amounts of these reported medications:  Acetaminophen (Norco)  **Note: The testing scope of this panel does not include following  reported medications:  Albuterol  Amlodipine (Norvasc)  Calcium  Chlorthalidone  Exemestane (Exemestan)  Fexofenadine (Allegra)  Fluticasone  Fluticas one (Flonase)  Losartan (Cozaar)  Multivitamin  Omeprazole (Nexium)  Tiotropium (Spiriva) ==================================================================== For clinical consultation, please call [phone]. ====================================================================   CBC with Differential/Platelet     Status: Abnormal   Collection Time: 02/08/16  3:56 PM  Result Value Ref Range   WBC 10.6 3.6 - 11.0 K/uL   RBC 4.68 3.80 - 5.20 MIL/uL   Hemoglobin 14.1 12.0 - 16.0 g/dL   HCT 41.6 35.0 - 47.0 %   MCV 88.9 80.0 - 100.0 fL   MCH 30.2 26.0 - 34.0 pg   MCHC 34.0 32.0 - 36.0 g/dL   RDW 13.2 11.5 - 14.5 %   Platelets 265 150 - 440 K/uL   Neutrophils Relative % 74 %  Neutro Abs 7.8 (H) 1.4 - 6.5 K/uL   Lymphocytes Relative 18 %   Lymphs Abs 1.9 1.0 - 3.6 K/uL   Monocytes Relative 6 %   Monocytes Absolute 0.7 0.2 - 0.9 K/uL   Eosinophils Relative 1 %   Eosinophils Absolute 0.1 0 - 0.7 K/uL   Basophils Relative 1 %   Basophils Absolute 0.1 0 - 0.1 K/uL  Comprehensive metabolic panel     Status: Abnormal   Collection Time: 02/08/16  3:56 PM  Result Value Ref Range   Sodium 134 (L) 135 - 145 mmol/L   Potassium 3.3 (L) 3.5 - 5.1 mmol/L   Chloride 97  (L) 101 - 111 mmol/L   CO2 27 22 - 32 mmol/L   Glucose, Bld 111 (H) 65 - 99 mg/dL   BUN 18 6 - 20 mg/dL   Creatinine, Ser 0.86 0.44 - 1.00 mg/dL   Calcium 9.3 8.9 - 10.3 mg/dL   Total Protein 7.5 6.5 - 8.1 g/dL   Albumin 4.4 3.5 - 5.0 g/dL   AST 22 15 - 41 U/L   ALT 25 14 - 54 U/L   Alkaline Phosphatase 83 38 - 126 U/L   Total Bilirubin 0.9 0.3 - 1.2 mg/dL   GFR calc non Af Amer >60 >60 mL/min   GFR calc Af Amer >60 >60 mL/min    Comment: (NOTE) The eGFR has been calculated using the CKD EPI equation. This calculation has not been validated in all clinical situations. eGFR's persistently <60 mL/min signify possible Chronic Kidney Disease.    Anion gap 10 5 - 15  C-reactive protein     Status: Abnormal   Collection Time: 02/08/16  3:56 PM  Result Value Ref Range   CRP 1.7 (H) <1.0 mg/dL    Comment: Performed at [facility]  Magnesium     Status: None   Collection Time: 02/08/16  3:56 PM  Result Value Ref Range   Magnesium 1.7 1.7 - 2.4 mg/dL  Sedimentation rate     Status: Abnormal   Collection Time: 02/08/16  3:56 PM  Result Value Ref Range   Sed Rate 34 (H) 0 - 30 mm/hr  Vitamin B12     Status: Abnormal   Collection Time: 02/08/16  3:56 PM  Result Value Ref Range   Vitamin B-12 965 (H) 180 - 914 pg/mL    Comment: (NOTE) This assay is not validated for testing neonatal or myeloproliferative syndrome specimens for Vitamin B12 levels. Performed at [facility] D2+D3     Status: None   Collection Time: 02/08/16  3:56 PM  Result Value Ref Range   25-Hydroxy, Vitamin D 35 ng/mL    Comment: (NOTE) Reference Range: All Ages: Target levels 30 - 100    25-Hydroxy, Vitamin D-2 3.4 ng/mL   25-Hydroxy, Vitamin D-3 32 ng/mL    Comment: (NOTE) Performed At: [facility] [place], [place] [id number] [place] Sheral Apley MD [id number]   Basic Metabolic Panel (BMET)     Status: Abnormal    Collection Time: 03/01/16 12:01 AM  Result Value Ref Range   Sodium 135 135 - 146 mmol/L   Potassium 3.6 3.5 - 5.3 mmol/L   Chloride 97 (L) 98 - 110 mmol/L   CO2 29 20 - 31 mmol/L   Glucose, Bld 90 65 - 99 mg/dL   BUN 12 7 - 25 mg/dL   Creat 0.75 0.60 - 0.93 mg/dL  Comment:   For patients > or = 78 years of age: The upper reference limit for Creatinine is approximately 13% higher for people identified as African-American.      Calcium 9.4 8.6 - 10.4 mg/dL    Assessment/Plan:  History of left breast cancer Getting systemic therapy currently.  Hypertension blood pressure control important in reducing the progression of atherosclerotic disease. On appropriate oral medications.   COPD (chronic obstructive pulmonary disease) with chronic bronchitis (HCC) On multiple medications  Chronic pain On medicines which she says has a blood thinner in it (likely an anti-inflammatory). Not interested in adding ASA at this time.  Carotid bruit The patient has an asymptomatic carotid bruit. Given her multiple atherosclerotic risk factors and her intolerance to statins, significant carotid artery disease could certainly be present. I would recommend a carotid duplex be performed at her convenience. We have discussed the pathophysiology and natural history of carotid artery disease and why it is important in the formation of strokes. The patient will have her carotid duplex and I will see her back following the study to discuss the results and determine further treatment options. I will hold off on the addition of aspirin until we get this at the patient's request. She is intolerant to statins.      Leotis Pain 03/04/2016, 10:12 AM   This note was created with Dragon medical transcription system.  Any errors from dictation are unintentional.

## 2016-03-04 NOTE — Assessment & Plan Note (Signed)
On multiple medications 

## 2016-03-04 NOTE — Assessment & Plan Note (Signed)
Getting systemic therapy currently.

## 2016-03-07 ENCOUNTER — Telehealth: Payer: Self-pay | Admitting: Family Medicine

## 2016-03-07 NOTE — Telephone Encounter (Signed)
Doubt that this is coming from the Potassium, but can stop it for 3-4 days and then restart it.-jh

## 2016-03-07 NOTE — Telephone Encounter (Signed)
Pt has been having side effects of taking the potassium.  She has had diarrhea and a red itchy rash on her face.  She asked if she needed to stop taking potassium or try something else.  Her call back number is [phone]

## 2016-03-07 NOTE — Telephone Encounter (Signed)
Patient stopped K+ on Friday and says itching of face has gone away. She still has some diarrhea. She wants to know if she can eat bananas and switch to otc.

## 2016-03-07 NOTE — Telephone Encounter (Signed)
That would not replace enough potassium.  Would like for her to try potassium again  On Friday.  Let me know how she does next week.-jh

## 2016-03-07 NOTE — Telephone Encounter (Signed)
Patient aware.

## 2016-03-15 ENCOUNTER — Ambulatory Visit: Payer: Medicare Other | Admitting: Pain Medicine

## 2016-03-22 DIAGNOSIS — J449 Chronic obstructive pulmonary disease, unspecified: Secondary | ICD-10-CM | POA: Diagnosis not present

## 2016-03-28 ENCOUNTER — Other Ambulatory Visit: Payer: Self-pay | Admitting: Family Medicine

## 2016-03-28 DIAGNOSIS — I10 Essential (primary) hypertension: Secondary | ICD-10-CM

## 2016-04-22 DIAGNOSIS — J449 Chronic obstructive pulmonary disease, unspecified: Secondary | ICD-10-CM | POA: Diagnosis not present

## 2016-04-26 ENCOUNTER — Other Ambulatory Visit: Payer: Self-pay | Admitting: *Deleted

## 2016-04-26 DIAGNOSIS — Z853 Personal history of malignant neoplasm of breast: Secondary | ICD-10-CM

## 2016-04-28 ENCOUNTER — Inpatient Hospital Stay: Payer: Medicare Other | Attending: Hematology and Oncology

## 2016-04-28 ENCOUNTER — Inpatient Hospital Stay (HOSPITAL_BASED_OUTPATIENT_CLINIC_OR_DEPARTMENT_OTHER): Payer: Medicare Other | Admitting: Hematology and Oncology

## 2016-04-28 VITALS — BP 139/83 | HR 92 | Temp 96.4°F | Resp 18 | Wt 221.1 lb

## 2016-04-28 DIAGNOSIS — Z96611 Presence of right artificial shoulder joint: Secondary | ICD-10-CM | POA: Insufficient documentation

## 2016-04-28 DIAGNOSIS — C50812 Malignant neoplasm of overlapping sites of left female breast: Secondary | ICD-10-CM | POA: Insufficient documentation

## 2016-04-28 DIAGNOSIS — Z923 Personal history of irradiation: Secondary | ICD-10-CM

## 2016-04-28 DIAGNOSIS — Z170 Estrogen receptor positive status [ER+]: Secondary | ICD-10-CM

## 2016-04-28 DIAGNOSIS — I10 Essential (primary) hypertension: Secondary | ICD-10-CM | POA: Diagnosis not present

## 2016-04-28 DIAGNOSIS — C50912 Malignant neoplasm of unspecified site of left female breast: Secondary | ICD-10-CM

## 2016-04-28 DIAGNOSIS — Z96612 Presence of left artificial shoulder joint: Secondary | ICD-10-CM | POA: Diagnosis not present

## 2016-04-28 DIAGNOSIS — M160 Bilateral primary osteoarthritis of hip: Secondary | ICD-10-CM | POA: Insufficient documentation

## 2016-04-28 DIAGNOSIS — J449 Chronic obstructive pulmonary disease, unspecified: Secondary | ICD-10-CM | POA: Insufficient documentation

## 2016-04-28 DIAGNOSIS — Z90710 Acquired absence of both cervix and uterus: Secondary | ICD-10-CM | POA: Insufficient documentation

## 2016-04-28 DIAGNOSIS — K219 Gastro-esophageal reflux disease without esophagitis: Secondary | ICD-10-CM | POA: Insufficient documentation

## 2016-04-28 DIAGNOSIS — Z87891 Personal history of nicotine dependence: Secondary | ICD-10-CM | POA: Diagnosis not present

## 2016-04-28 DIAGNOSIS — Z79899 Other long term (current) drug therapy: Secondary | ICD-10-CM | POA: Insufficient documentation

## 2016-04-28 DIAGNOSIS — M85862 Other specified disorders of bone density and structure, left lower leg: Secondary | ICD-10-CM

## 2016-04-28 DIAGNOSIS — M8580 Other specified disorders of bone density and structure, unspecified site: Secondary | ICD-10-CM

## 2016-04-28 LAB — COMPREHENSIVE METABOLIC PANEL
ALT: 20 U/L (ref 14–54)
AST: 23 U/L (ref 15–41)
Albumin: 4.3 g/dL (ref 3.5–5.0)
Alkaline Phosphatase: 73 U/L (ref 38–126)
Anion gap: 9 (ref 5–15)
BUN: 15 mg/dL (ref 6–20)
CO2: 29 mmol/L (ref 22–32)
Calcium: 9.9 mg/dL (ref 8.9–10.3)
Chloride: 96 mmol/L — ABNORMAL LOW (ref 101–111)
Creatinine, Ser: 0.84 mg/dL (ref 0.44–1.00)
GFR calc Af Amer: >60 mL/min (ref >60)
GFR calc non Af Amer: >60 mL/min (ref >60)
Glucose, Bld: 98 mg/dL (ref 65–99)
Potassium: 3.4 mmol/L — ABNORMAL LOW (ref 3.5–5.1)
Sodium: 134 mmol/L — ABNORMAL LOW (ref 135–145)
Total Bilirubin: 0.6 mg/dL (ref 0.3–1.2)
Total Protein: 7.3 g/dL (ref 6.5–8.1)

## 2016-04-28 LAB — CBC WITH DIFFERENTIAL/PLATELET
Basophils Absolute: 0.1 10*3/uL (ref 0–0.1)
Basophils Relative: 1 %
Eosinophils Absolute: 0.1 10*3/uL (ref 0–0.7)
Eosinophils Relative: 2 %
HCT: 39.5 % (ref 35.0–47.0)
Hemoglobin: 13.6 g/dL (ref 12.0–16.0)
Lymphocytes Relative: 19 %
Lymphs Abs: 1.4 10*3/uL (ref 1.0–3.6)
MCH: 30.3 pg (ref 26.0–34.0)
MCHC: 34.4 g/dL (ref 32.0–36.0)
MCV: 88.1 fL (ref 80.0–100.0)
Monocytes Absolute: 0.7 10*3/uL (ref 0.2–0.9)
Monocytes Relative: 10 %
Neutro Abs: 5.1 10*3/uL (ref 1.4–6.5)
Neutrophils Relative %: 68 %
Platelets: 290 10*3/uL (ref 150–440)
RBC: 4.48 MIL/uL (ref 3.80–5.20)
RDW: 14.1 % (ref 11.5–14.5)
WBC: 7.5 10*3/uL (ref 3.6–11.0)

## 2016-04-28 MED ORDER — EXEMESTANE 25 MG PO TABS: 25.0000 mg | ORAL_TABLET | Freq: Every day | ORAL | 1 refills | Status: DC

## 2016-04-28 NOTE — Progress Notes (Signed)
Patient here today for f/u regarding breast cancer.  Former patient of Dr. Oliva Bustard.  States she has COPD - uses 2.5 L O2 at night.

## 2016-04-28 NOTE — Progress Notes (Signed)
[facility] day:  04/28/2016  Chief Complaint: Candace Tapia is a 81 y.o. female with stage I left breast cancer who is seen for reassessment.  HPI:  The patient was diagnosed with breast cancer in 2014.  Mammogram on 09/17/2012 revealed increased conspicuity of nodularity on the left in the midportion of the breast.  Spot compression views on 09/18/2012 revealed a persistent abnormal-appearing irregularly marginated structure in the deep parenchyma of the left breast at approximately the 12:00 position. There were faint microcalcifications.  Left breast biopsy on 11/05/2012 revealed invasive mammary carcinoma with infiltrative features. There was DCIS, solid type, nuclear grade II. Microcalcifications associated with invasive and in situ carcinoma.  Tumor was ER positive (> 90%), PR positive (5%) and HER-2/neu negative.  She underwent left rest partial mastectomy and sentinel lymph node biopsy on 11/20/2012 by Dr. Pat Patrick.  There was a 0.7 cm focus of grade II invasive mammary carcinoma with associated DCIS. Lungs were negative. Angiolymphatic invasion was negative.  Two sentinel lymph nodes were negative.  Pathologic stage was T1bN0.  She completed radiation therapy in 12/2012.  She declined genetic testing.  She began Femara  in 02/2013.  Femara was discontinued because of side effects in 08/2013.  She began Aromasin in 08/2013.  Bone density study on 09/30/2014 revealed osteopenia with a T-score of -2.0 in L1-L4 and -0.9 in left femoral neck.  She is on calcium and vitamin D.  Bilateral diagnostic mammogram on 09/25/2015 revealed stable breast parenchymal pattern with no worrisome findings for malignancy.  The patient was last seen by Georgeanne Nim, NP on 10/27/2015.  At that time, she was doing well on Aromasin with no evidence of recurrent disease.  Bone scan on 02/02/2016 revealed no evidence of metastatic disease.  There was degenerative changes  in scattered joints, tip of humeral component left shoulder (? prosthetic loosening), and degenerative changes in right hip joint/right femoral head/neck.  Hip films were recommended.  Symptomatically, she notes arthritis in her back. She is status post bilateral shoulder replacement.  She also has chronic arthritis in her hips and knees.  She has rare hot flashes.  She notes COPD and shortness of breath with walking for the past 12 years.   Past Medical History:  Diagnosis Date  . Allergy   . Arthritis   . Breast cancer ([place]) 2014   radiation and taking exemestane  . Cancer of left breast ([place]) 10/27/2014  . COPD (chronic obstructive pulmonary disease) ([place])   . GERD (gastroesophageal reflux disease)   . H/O: hysterectomy   . History of lumpectomy 2014   LT breast  . Hypertension     Past Surgical History:  Procedure Laterality Date  . ABDOMINAL HYSTERECTOMY    . BREAST SURGERY    . JOINT REPLACEMENT    . LYMPH NODE DISSECTION     neck  . TOTAL SHOULDER REPLACEMENT Bilateral 2010 and 2013    Family History  Problem Relation Age of Onset  . Breast cancer Mother 57  . Kidney disease Father   . Breast cancer Sister 83    Social History:  reports that she has quit smoking. She has never used smokeless tobacco. She reports that she does not drink alcohol or use drugs.  The patient lives in [place].  She lives alone.  The patient is alone today.  Allergies:  Allergies  Allergen Reactions  . Tramadol Hcl Itching    Per pt it caused me to  itch all over  . Penicillins Other (See Comments)  . Statins     Not sure what exact med was; just made her feel bad (can't elaborate)  . Penicillin G Rash  . Sulfa Antibiotics Other (See Comments) and Rash    Current Medications: Current Outpatient Prescriptions  Medication Sig Dispense Refill  . albuterol (PROAIR HFA) 108 (90 BASE) MCG/ACT inhaler Inhale into the lungs.    Marland Kitchen amLODipine (NORVASC) 5 MG tablet TAKE ONE (1) TABLET  EACH DAY 90 tablet 3  . calcium gluconate 650 MG tablet Take 650 mg by mouth daily.    . chlorthalidone (HYGROTON) 25 MG tablet TAKE ONE TABLET BY MOUTH EVERY DAY 30 tablet 6  . exemestane (AROMASIN) 25 MG tablet Take 1 tablet (25 mg total) by mouth daily. 90 tablet 1  . fexofenadine (ALLEGRA ALLERGY) 180 MG tablet Take by mouth as needed. Reported on 09/29/2015    . fluticasone (FLONASE) 50 MCG/ACT nasal spray Place into both nostrils as needed.     Marland Kitchen losartan (COZAAR) 100 MG tablet Take 1 tablet (100 mg total) by mouth daily. 90 tablet 3  . Multiple Vitamins-Minerals (MULTIVITAMIN WOMEN 50+) TABS Take 2 tablets by mouth daily.    Marland Kitchen NEXIUM 20 MG capsule Take 1 capsule (20 mg total) by mouth daily. 90 capsule 3  . tiotropium (SPIRIVA HANDIHALER) 18 MCG inhalation capsule Place 18 mcg into inhaler and inhale daily.     Marland Kitchen HYDROcodone-acetaminophen (NORCO/VICODIN) 5-325 MG tablet Take 0.5-1 tablets by mouth every 6 (six) hours as needed for severe pain. 60 tablet 0  . potassium chloride SA (K-DUR,KLOR-CON) 20 MEQ tablet Take 1 tablet (20 mEq total) by mouth daily. (Patient not taking: Reported on 04/28/2016) 30 tablet 6   No current facility-administered medications for this visit.     Review of Systems:  GENERAL:  Feels "ok".  No fevers, sweats or weight loss. PERFORMANCE STATUS (ECOG):  1 HEENT:  No visual changes, runny nose, sore throat, mouth sores or tenderness. Lungs: Shortness of breath with exertion.  No cough.  No hemoptysis.  On chronic oxygen 2.5 liters vis [place]. Cardiac:  No chest pain, palpitations, orthopnea, or PND. GI:  No nausea, vomiting, diarrhea, constipation, melena or hematochezia. GU:  No urgency, frequency, dysuria, or hematuria. Musculoskeletal:  Chronic back, hip, and knee pain secondary to arthritis.  No muscle tenderness. Extremities:  No pain or swelling. Skin:  No rashes or skin changes. Neuro:  No headache, numbness or weakness, balance or coordination  issues. Endocrine:  No diabetes, thyroid issues.  Some hot flashes.  No night sweats. Psych:  No mood changes, depression or anxiety. Pain:  No focal pain. Review of systems:  All other systems reviewed and found to be negative.  Physical Exam: Blood pressure 139/83, pulse 92, temperature (!) 96.4 F (35.8 C), temperature source Tympanic, resp. rate 18, weight 221 lb 1.9 oz (100.3 kg). GENERAL:  Well developed, well nourished, woman sitting comfortably in the exam room in no acute distress. MENTAL STATUS:  Alert and oriented to person, place and time. HEAD:  Short gray hair.  Normocephalic, atraumatic, face symmetric, no Cushingoid features. EYES:  Glasses.  Blue eyes.  Pupils equal round and reactive to light and accomodation.  No conjunctivitis or scleral icterus. ENT:  Oropharynx clear without lesion.  Tongue normal. Mucous membranes moist.  RESPIRATORY:  Clear to auscultation without rales, wheezes or rhonchi. CARDIOVASCULAR:  Regular rate and rhythm without murmur, rub or gallop. BREAST:  Right breast  with fibrocystic changes at 9 o'clock position.  No masses, skin changes or nipple discharge.  Left breast with horizontal incision superiorly.  Tender edema inferiorly.  No masses, skin changes or nipple discharge.  ABDOMEN:  Soft, non-tender, with active bowel sounds, and no appreciable hepatosplenomegaly.  No masses. SKIN:  No rashes, ulcers or lesions. EXTREMITIES: No edema, no skin discoloration or tenderness.  No palpable cords. LYMPH NODES: No palpable cervical, supraclavicular, axillary or inguinal adenopathy  NEUROLOGICAL: Unremarkable. PSYCH:  Appropriate.   Appointment on 04/28/2016  Component Date Value Ref Range Status  . WBC 04/28/2016 7.5  3.6 - 11.0 K/uL Final  . RBC 04/28/2016 4.48  3.80 - 5.20 MIL/uL Final  . Hemoglobin 04/28/2016 13.6  12.0 - 16.0 g/dL Final  . HCT 04/28/2016 39.5  35.0 - 47.0 % Final  . MCV 04/28/2016 88.1  80.0 - 100.0 fL Final  . MCH 04/28/2016  30.3  26.0 - 34.0 pg Final  . MCHC 04/28/2016 34.4  32.0 - 36.0 g/dL Final  . RDW 04/28/2016 14.1  11.5 - 14.5 % Final  . Platelets 04/28/2016 290  150 - 440 K/uL Final  . Neutrophils Relative % 04/28/2016 68  % Final  . Neutro Abs 04/28/2016 5.1  1.4 - 6.5 K/uL Final  . Lymphocytes Relative 04/28/2016 19  % Final  . Lymphs Abs 04/28/2016 1.4  1.0 - 3.6 K/uL Final  . Monocytes Relative 04/28/2016 10  % Final  . Monocytes Absolute 04/28/2016 0.7  0.2 - 0.9 K/uL Final  . Eosinophils Relative 04/28/2016 2  % Final  . Eosinophils Absolute 04/28/2016 0.1  0 - 0.7 K/uL Final  . Basophils Relative 04/28/2016 1  % Final  . Basophils Absolute 04/28/2016 0.1  0 - 0.1 K/uL Final  . Sodium 04/28/2016 134* 135 - 145 mmol/L Final  . Potassium 04/28/2016 3.4* 3.5 - 5.1 mmol/L Final  . Chloride 04/28/2016 96* 101 - 111 mmol/L Final  . CO2 04/28/2016 29  22 - 32 mmol/L Final  . Glucose, Bld 04/28/2016 98  65 - 99 mg/dL Final  . BUN 04/28/2016 15  6 - 20 mg/dL Final  . Creatinine, Ser 04/28/2016 0.84  0.44 - 1.00 mg/dL Final  . Calcium 04/28/2016 9.9  8.9 - 10.3 mg/dL Final  . Total Protein 04/28/2016 7.3  6.5 - 8.1 g/dL Final  . Albumin 04/28/2016 4.3  3.5 - 5.0 g/dL Final  . AST 04/28/2016 23  15 - 41 U/L Final  . ALT 04/28/2016 20  14 - 54 U/L Final  . Alkaline Phosphatase 04/28/2016 73  38 - 126 U/L Final  . Total Bilirubin 04/28/2016 0.6  0.3 - 1.2 mg/dL Final  . GFR calc non Af Amer 04/28/2016 >60  >60 mL/min Final  . GFR calc Af Amer 04/28/2016 >60  >60 mL/min Final   Comment: (NOTE) The eGFR has been calculated using the CKD EPI equation. This calculation has not been validated in all clinical situations. eGFR's persistently <60 mL/min signify possible Chronic Kidney Disease.   . Anion gap 04/28/2016 9  5 - 15 Final    Assessment:  JULYANNA SCHOLLE is a 81 y.o. female with stage I left breast cancer s/p partial mastectomy and sentinel lymph node biopsy on 11/20/2012.  Pathology revealed  a 0.7 cm focus of grade II invasive mammary carcinoma with associated DCIS.  Margins were negative.  There was no angiolymphatic invasion was negative.  Two sentinel lymph nodes were negative.  Tumor was ER positive (>  90%), PR positive (5%) and HER-2/neu negative.  Pathologic stage was T1bN0.  She completed radiation therapy in 12/2012.  She declined genetic testing.  She began Femara  in 02/2013.  Femara was discontinued because of side effects in 08/2013.  She began Aromasin in 08/2013.  Bilateral diagnostic mammogram on 09/25/2015 revealed stable breast parenchymal pattern with no worrisome findings for malignancy.  Bone scan on 02/02/2016 revealed no evidence of metastatic disease.  There was degenerative changes in scattered joints, tip of humeral component left shoulder (? prosthetic loosening), and degenerative changes in right hip joint/right femoral head/neck.  Bone density study on 09/30/2014 revealed osteopenia with a T-score of -2.0 in L1-L4 and -0.9 in left femoral neck.  She is on calcium and vitamin D.  She has COPD and is on 2.5 liters oxygen vis [place].  Symptomatically, she notes arthritis in her back, hips and knees.  She has rare hot flashes.  Exam is unremarkable.  Plan: 1.  Discuss entire medical history, diagnosis and management of breast cancer.  Discuss hormonal therapy for 5-10 years.  Discuss BCI testing. 2.  Discuss osteopenia.  Discuss calcium, vitamin D, and activity level.  Discuss follow-up bone density study every 2 years. 3.  Labs today:  CBC with diff, CMP, CA27.29. 4.  Mammogram 09/24/2016. 5.  Bone density 09/29/2016. 6.  RTC in 6 months for MD assess, labs (CBC with diff, CMP, CA27.29), and review of imaging studies.   Lequita Asal, MD  04/28/2016, 11:18 AM

## 2016-04-29 LAB — CANCER ANTIGEN 27.29: CA 27.29: 20.8 U/mL (ref 0.0–38.6)

## 2016-05-23 DIAGNOSIS — J449 Chronic obstructive pulmonary disease, unspecified: Secondary | ICD-10-CM | POA: Diagnosis not present

## 2016-06-13 ENCOUNTER — Encounter: Payer: Self-pay | Admitting: Family Medicine

## 2016-06-13 ENCOUNTER — Encounter: Payer: Self-pay | Admitting: *Deleted

## 2016-06-13 ENCOUNTER — Ambulatory Visit (INDEPENDENT_AMBULATORY_CARE_PROVIDER_SITE_OTHER): Payer: Medicare Other | Admitting: Family Medicine

## 2016-06-13 ENCOUNTER — Telehealth: Payer: Self-pay | Admitting: *Deleted

## 2016-06-13 VITALS — BP 130/65 | HR 96 | Temp 98.0°F | Resp 16 | Ht 65.5 in | Wt 228.6 lb

## 2016-06-13 DIAGNOSIS — K219 Gastro-esophageal reflux disease without esophagitis: Secondary | ICD-10-CM

## 2016-06-13 DIAGNOSIS — R0989 Other specified symptoms and signs involving the circulatory and respiratory systems: Secondary | ICD-10-CM

## 2016-06-13 DIAGNOSIS — J449 Chronic obstructive pulmonary disease, unspecified: Secondary | ICD-10-CM

## 2016-06-13 DIAGNOSIS — I10 Essential (primary) hypertension: Secondary | ICD-10-CM | POA: Diagnosis not present

## 2016-06-13 DIAGNOSIS — F330 Major depressive disorder, recurrent, mild: Secondary | ICD-10-CM

## 2016-06-13 MED ORDER — NORTRIPTYLINE HCL 10 MG PO CAPS: 10.0000 mg | ORAL_CAPSULE | Freq: Every day | ORAL | 3 refills | Status: DC

## 2016-06-13 NOTE — Progress Notes (Signed)
Name: Candace Tapia   MRN: [id number]    DOB: 1935-12-01   Date:06/13/2016       Progress Note  Subjective  Chief Complaint  Chief Complaint  Patient presents with  . Hypertension    HPI Here for f/u of HBP.  Also with COPD.  Breathing doing poorly with any activity, but doing ok at rest.  She wheezes at times.  She feels SOB even with good O2 levels.  She has seen Dr. Lucky Cowboy but she hs noot yet had a carotid doppler; scheduling problem.  Needs to reschedule. No problem-specific Assessment & Plan notes found for this encounter.   Past Medical History:  Diagnosis Date  . Allergy   . Arthritis   . Breast cancer ([place]) 2014   radiation and taking exemestane  . Cancer of left breast ([place]) 10/27/2014  . COPD (chronic obstructive pulmonary disease) ([place])   . GERD (gastroesophageal reflux disease)   . H/O: hysterectomy   . History of lumpectomy 2014   LT breast  . Hypertension     Past Surgical History:  Procedure Laterality Date  . ABDOMINAL HYSTERECTOMY    . BREAST SURGERY    . JOINT REPLACEMENT    . LYMPH NODE DISSECTION     neck  . TOTAL SHOULDER REPLACEMENT Bilateral 2010 and 2013    Family History  Problem Relation Age of Onset  . Breast cancer Mother 18  . Kidney disease Father   . Breast cancer Sister 79    Social History   Social History  . Marital status: Widowed    Spouse name: N/A  . Number of children: N/A  . Years of education: N/A   Occupational History  . Not on file.   Social History Main Topics  . Smoking status: Former Research scientist (life sciences)  . Smokeless tobacco: Never Used  . Alcohol use No  . Drug use: No  . Sexual activity: Not on file   Other Topics Concern  . Not on file   Social History Narrative  . No narrative on file     Current Outpatient Prescriptions:  .  albuterol (PROAIR HFA) 108 (90 BASE) MCG/ACT inhaler, Inhale into the lungs., Disp: , Rfl:  .  amLODipine (NORVASC) 5 MG tablet, TAKE ONE (1) TABLET EACH DAY, Disp: 90 tablet, Rfl:  3 .  calcium gluconate 650 MG tablet, Take 650 mg by mouth daily., Disp: , Rfl:  .  chlorthalidone (HYGROTON) 25 MG tablet, TAKE ONE TABLET BY MOUTH EVERY DAY, Disp: 30 tablet, Rfl: 6 .  exemestane (AROMASIN) 25 MG tablet, Take 1 tablet (25 mg total) by mouth daily., Disp: 90 tablet, Rfl: 1 .  fexofenadine (ALLEGRA ALLERGY) 180 MG tablet, Take by mouth as needed. Reported on 09/29/2015, Disp: , Rfl:  .  fluticasone (FLONASE) 50 MCG/ACT nasal spray, Place into both nostrils as needed. , Disp: , Rfl:  .  losartan (COZAAR) 100 MG tablet, Take 1 tablet (100 mg total) by mouth daily., Disp: 90 tablet, Rfl: 3 .  Multiple Vitamins-Minerals (MULTIVITAMIN WOMEN 50+) TABS, Take 2 tablets by mouth daily., Disp: , Rfl:  .  NEXIUM 20 MG capsule, Take 1 capsule (20 mg total) by mouth daily., Disp: 90 capsule, Rfl: 3 .  tiotropium (SPIRIVA HANDIHALER) 18 MCG inhalation capsule, Place 18 mcg into inhaler and inhale daily. , Disp: , Rfl:  .  HYDROcodone-acetaminophen (NORCO/VICODIN) 5-325 MG tablet, Take 0.5-1 tablets by mouth every 6 (six) hours as needed for severe pain., Disp: 60 tablet, Rfl:  0 .  nortriptyline (PAMELOR) 10 MG capsule, Take 1 capsule (10 mg total) by mouth at bedtime., Disp: 30 capsule, Rfl: 3 .  potassium chloride SA (K-DUR,KLOR-CON) 20 MEQ tablet, Take 1 tablet (20 mEq total) by mouth daily. (Patient not taking: Reported on 06/13/2016), Disp: 30 tablet, Rfl: 6  Allergies  Allergen Reactions  . Tramadol Hcl Itching    Per pt it caused me to itch all over  . Penicillins Other (See Comments)  . Statins     Not sure what exact med was; just made her feel bad (can't elaborate)  . Penicillin G Rash  . Sulfa Antibiotics Other (See Comments) and Rash     Review of Systems  Constitutional: Negative for chills, fever, malaise/fatigue and weight loss.  HENT: Negative for hearing loss and tinnitus.   Eyes: Negative for blurred vision and double vision.  Respiratory: Positive for cough (occ.),  shortness of breath and wheezing.   Cardiovascular: Negative for chest pain, palpitations and leg swelling.  Gastrointestinal: Negative for abdominal pain, blood in stool and heartburn.  Genitourinary: Negative for dysuria, frequency and urgency.  Musculoskeletal: Positive for joint pain (diffuse). Negative for myalgias.  Skin: Negative for rash.  Neurological: Negative for dizziness, tingling, tremors, weakness and headaches.      Objective  Vitals:   06/13/16 1311 06/13/16 1355  BP: (!) 159/85 130/65  Pulse: (!) 104 96  Resp: 16   Temp: 98 F (36.7 C)   TempSrc: Oral   SpO2: 100%   Weight: 228 lb 9.6 oz (103.7 kg)   Height: 5' 5.5" (1.664 m)     Physical Exam  Constitutional: She is oriented to person, place, and time and well-developed, well-nourished, and in no distress. No distress.  HENT:  Head: Normocephalic and atraumatic.  Eyes: Conjunctivae and EOM are normal. Pupils are equal, round, and reactive to light. No scleral icterus.  Neck: Normal range of motion. Neck supple. Carotid bruit is present (bilateral, L>R). No thyromegaly present.  Cardiovascular: Normal rate, regular rhythm and normal heart sounds.  Exam reveals no gallop and no friction rub.   No murmur heard. Pulmonary/Chest: Effort normal and breath sounds normal. She has no wheezes. She has no rales.  Musculoskeletal: She exhibits edema (1`+ edema L foot/leg; trace edema R foot/leg).  Lymphadenopathy:    She has no cervical adenopathy.  Neurological: She is alert and oriented to person, place, and time.  Psychiatric:  Affect is mildly depressed with long hx of depressive sx and strong family hx of depression.  Vitals reviewed.      Recent Results (from the past 2160 hour(s))  CBC with Differential     Status: None   Collection Time: 04/28/16  9:40 AM  Result Value Ref Range   WBC 7.5 3.6 - 11.0 K/uL   RBC 4.48 3.80 - 5.20 MIL/uL   Hemoglobin 13.6 12.0 - 16.0 g/dL   HCT 39.5 35.0 - 47.0 %    MCV 88.1 80.0 - 100.0 fL   MCH 30.3 26.0 - 34.0 pg   MCHC 34.4 32.0 - 36.0 g/dL   RDW 14.1 11.5 - 14.5 %   Platelets 290 150 - 440 K/uL   Neutrophils Relative % 68 %   Neutro Abs 5.1 1.4 - 6.5 K/uL   Lymphocytes Relative 19 %   Lymphs Abs 1.4 1.0 - 3.6 K/uL   Monocytes Relative 10 %   Monocytes Absolute 0.7 0.2 - 0.9 K/uL   Eosinophils Relative 2 %   Eosinophils  Absolute 0.1 0 - 0.7 K/uL   Basophils Relative 1 %   Basophils Absolute 0.1 0 - 0.1 K/uL  Comprehensive metabolic panel     Status: Abnormal   Collection Time: 04/28/16  9:40 AM  Result Value Ref Range   Sodium 134 (L) 135 - 145 mmol/L   Potassium 3.4 (L) 3.5 - 5.1 mmol/L   Chloride 96 (L) 101 - 111 mmol/L   CO2 29 22 - 32 mmol/L   Glucose, Bld 98 65 - 99 mg/dL   BUN 15 6 - 20 mg/dL   Creatinine, Ser 0.84 0.44 - 1.00 mg/dL   Calcium 9.9 8.9 - 10.3 mg/dL   Total Protein 7.3 6.5 - 8.1 g/dL   Albumin 4.3 3.5 - 5.0 g/dL   AST 23 15 - 41 U/L   ALT 20 14 - 54 U/L   Alkaline Phosphatase 73 38 - 126 U/L   Total Bilirubin 0.6 0.3 - 1.2 mg/dL   GFR calc non Af Amer >60 >60 mL/min   GFR calc Af Amer >60 >60 mL/min    Comment: (NOTE) The eGFR has been calculated using the CKD EPI equation. This calculation has not been validated in all clinical situations. eGFR's persistently <60 mL/min signify possible Chronic Kidney Disease.    Anion gap 9 5 - 15  Cancer antigen 27.29     Status: None   Collection Time: 04/28/16  9:40 AM  Result Value Ref Range   CA 27.29 20.8 0.0 - 38.6 U/mL    Comment: (NOTE) [employer]/ACS methodology Performed At: [facility] [address] [place], [place] [id number] Lindon Romp MD OT:[id number]      Assessment & Plan  Problem List Items Addressed This Visit      Cardiovascular and Mediastinum   Hypertension - Primary     Respiratory   COPD (chronic obstructive pulmonary disease) with chronic bronchitis (HCC)     Digestive   Gastro-esophageal reflux disease without  esophagitis     Other   Clinical depression   Relevant Medications   nortriptyline (PAMELOR) 10 MG capsule   Carotid bruit      Meds ordered this encounter  Medications  . nortriptyline (PAMELOR) 10 MG capsule    Sig: Take 1 capsule (10 mg total) by mouth at bedtime.    Dispense:  30 capsule    Refill:  3   1. Essential hypertension Cont Amlodipine, Losartan and Chlorthalidone  2. COPD (chronic obstructive pulmonary disease) with chronic bronchitis (HCC) Cont meds  3. Gastro-esophageal reflux disease without esophagitis Cont meds  4. Bruit of left carotid artery Refer back tpo Dr. Lucky Cowboy for carotid doppler  5. Mild episode of recurrent major depressive disorder (HCC)  - nortriptyline (PAMELOR) 10 MG capsule; Take 1 capsule (10 mg total) by mouth at bedtime.  Dispense: 30 capsule; Refill: 3

## 2016-06-13 NOTE — Telephone Encounter (Signed)
Doppler scheduled 07/26/16 at 11:15 patient is aware.

## 2016-06-19 ENCOUNTER — Encounter: Payer: Self-pay | Admitting: Hematology and Oncology

## 2016-06-20 DIAGNOSIS — J449 Chronic obstructive pulmonary disease, unspecified: Secondary | ICD-10-CM | POA: Diagnosis not present

## 2016-06-22 DIAGNOSIS — R0602 Shortness of breath: Secondary | ICD-10-CM | POA: Diagnosis not present

## 2016-06-22 DIAGNOSIS — G4734 Idiopathic sleep related nonobstructive alveolar hypoventilation: Secondary | ICD-10-CM | POA: Diagnosis not present

## 2016-06-22 DIAGNOSIS — R0600 Dyspnea, unspecified: Secondary | ICD-10-CM | POA: Diagnosis not present

## 2016-06-22 DIAGNOSIS — J449 Chronic obstructive pulmonary disease, unspecified: Secondary | ICD-10-CM | POA: Diagnosis not present

## 2016-07-13 DIAGNOSIS — R0602 Shortness of breath: Secondary | ICD-10-CM | POA: Diagnosis not present

## 2016-07-21 DIAGNOSIS — J449 Chronic obstructive pulmonary disease, unspecified: Secondary | ICD-10-CM | POA: Diagnosis not present

## 2016-07-26 ENCOUNTER — Encounter (INDEPENDENT_AMBULATORY_CARE_PROVIDER_SITE_OTHER): Payer: Self-pay | Admitting: Vascular Surgery

## 2016-07-26 ENCOUNTER — Ambulatory Visit (INDEPENDENT_AMBULATORY_CARE_PROVIDER_SITE_OTHER): Payer: Medicare Other | Admitting: Vascular Surgery

## 2016-07-26 ENCOUNTER — Ambulatory Visit (INDEPENDENT_AMBULATORY_CARE_PROVIDER_SITE_OTHER): Payer: Medicare Other

## 2016-07-26 ENCOUNTER — Other Ambulatory Visit: Payer: Self-pay | Admitting: Family Medicine

## 2016-07-26 VITALS — BP 168/90 | HR 90 | Resp 16 | Ht 65.5 in | Wt 227.0 lb

## 2016-07-26 DIAGNOSIS — R0989 Other specified symptoms and signs involving the circulatory and respiratory systems: Secondary | ICD-10-CM

## 2016-07-26 DIAGNOSIS — I10 Essential (primary) hypertension: Secondary | ICD-10-CM

## 2016-07-26 MED ORDER — LOSARTAN POTASSIUM 100 MG PO TABS: 100.0000 mg | ORAL_TABLET | Freq: Every day | ORAL | 3 refills | Status: DC

## 2016-07-26 NOTE — Assessment & Plan Note (Signed)
blood pressure control important in reducing the progression of atherosclerotic disease. On appropriate oral medications.  

## 2016-07-26 NOTE — Progress Notes (Signed)
MRN : [id number]  Candace Tapia is a 81 y.o. (1935/12/04) female who presents with chief complaint of  Chief Complaint  Patient presents with  . Carotid    Follow up ultrasound  .  History of Present Illness: Patient returns today in follow up With carotid duplex today. She is doing well without specific complaints or new problems today. She had an asymptomatic carotid bruit detected several months ago. Her duplex today shows no significant carotid artery stenosis in either lower extremity with some mild intimal thickening and minimal plaque as the only abnormalities.       Past Medical History:  Diagnosis Date  . Allergy   . Arthritis   . Breast cancer ([place]) 2014   radiation and taking exemestane  . Cancer of left breast ([place]) 10/27/2014  . COPD (chronic obstructive pulmonary disease) ([place])   . GERD (gastroesophageal reflux disease)   . H/O: hysterectomy   . History of lumpectomy 2014   LT breast  . Hypertension          Past Surgical History:  Procedure Laterality Date  . ABDOMINAL HYSTERECTOMY    . BREAST SURGERY    . JOINT REPLACEMENT    . LYMPH NODE DISSECTION     neck  . TOTAL SHOULDER REPLACEMENT Bilateral 2010 and 2013         Family History  Problem Relation Age of Onset  . Breast cancer Mother 26  . Kidney disease Father   . Breast cancer Sister 82  No bleeding or clotting disorders  Social History     Social History  Substance Use Topics  . Smoking status: Former Research scientist (life sciences)  . Smokeless tobacco: Never Used  . Alcohol use No  No IV drug use       Allergies  Allergen Reactions  . Tramadol Hcl Itching    Per pt it caused me to itch all over  . Penicillins Other (See Comments)  . Statins     Not sure what exact med was; just made her feel bad (can't elaborate)  . Penicillin G Rash  . Sulfa Antibiotics Other (See Comments) and Rash          Current Outpatient Prescriptions  Medication Sig Dispense Refill  .  albuterol (PROAIR HFA) 108 (90 BASE) MCG/ACT inhaler Inhale into the lungs.    Marland Kitchen amLODipine (NORVASC) 5 MG tablet TAKE ONE (1) TABLET EACH DAY 90 tablet 3  . calcium gluconate 650 MG tablet Take 650 mg by mouth daily.    . chlorthalidone (HYGROTON) 25 MG tablet Take 1 tablet (25 mg total) by mouth daily. 30 tablet 6  . exemestane (AROMASIN) 25 MG tablet TAKE ONE TABLET BY MOUTH EVERY DAY 90 tablet 1  . fexofenadine (ALLEGRA ALLERGY) 180 MG tablet Take by mouth as needed. Reported on 09/29/2015    . fluticasone (FLONASE) 50 MCG/ACT nasal spray Place into both nostrils as needed.     Marland Kitchen HYDROcodone-acetaminophen (NORCO/VICODIN) 5-325 MG tablet Take 0.5-1 tablets by mouth every 6 (six) hours as needed for severe pain. 60 tablet 0  . losartan (COZAAR) 100 MG tablet Take 1 tablet (100 mg total) by mouth daily. 90 tablet 3  . Multiple Vitamins-Minerals (MULTIVITAMIN WOMEN 50+) TABS Take 2 tablets by mouth daily.    Marland Kitchen NEXIUM 20 MG capsule Take 1 capsule (20 mg total) by mouth daily. 90 capsule 3  . potassium chloride SA (K-DUR,KLOR-CON) 20 MEQ tablet Take 1 tablet (20 mEq total) by mouth daily.  30 tablet 6  . tiotropium (SPIRIVA HANDIHALER) 18 MCG inhalation capsule Place 18 mcg into inhaler and inhale daily.      No current facility-administered medications for this visit.       REVIEW OF SYSTEMS (Negative unless checked)  Constitutional: '[]'$ Weight loss  '[]'$ Fever  '[]'$ Chills Cardiac: '[]'$ Chest pain   '[]'$ Chest pressure   '[]'$ Palpitations   '[]'$ Shortness of breath when laying flat   '[]'$ Shortness of breath at rest   '[x]'$ Shortness of breath with exertion. Vascular:  '[]'$ Pain in legs with walking   '[]'$ Pain in legs at rest   '[]'$ Pain in legs when laying flat   '[]'$ Claudication   '[]'$ Pain in feet when walking  '[]'$ Pain in feet at rest  '[]'$ Pain in feet when laying flat   '[]'$ History of DVT   '[]'$ Phlebitis   '[]'$ Swelling in legs   '[]'$ Varicose veins   '[]'$ Non-healing ulcers Pulmonary:   '[]'$ Uses home oxygen   '[]'$ Productive cough    '[]'$ Hemoptysis   '[]'$ Wheeze  '[x]'$ COPD   '[]'$ Asthma Neurologic:  '[]'$ Dizziness  '[]'$ Blackouts   '[]'$ Seizures   '[]'$ History of stroke   '[]'$ History of TIA  '[]'$ Aphasia   '[]'$ Temporary blindness   '[]'$ Dysphagia   '[]'$ Weakness or numbness in arms   '[]'$ Weakness or numbness in legs Musculoskeletal:  '[x]'$ Arthritis   '[]'$ Joint swelling   '[x]'$ Joint pain   '[x]'$ Low back pain Hematologic:  '[]'$ Easy bruising  '[]'$ Easy bleeding   '[]'$ Hypercoagulable state   '[]'$ Anemic  '[]'$ Hepatitis Gastrointestinal:  '[]'$ Blood in stool   '[]'$ Vomiting blood  '[]'$ Gastroesophageal reflux/heartburn   '[]'$ Abdominal pain Genitourinary:  '[]'$ Chronic kidney disease   '[]'$ Difficult urination  '[]'$ Frequent urination  '[]'$ Burning with urination   '[]'$ Hematuria Skin:  '[]'$ Rashes   '[]'$ Ulcers   '[]'$ Wounds Psychological:  '[]'$ History of anxiety   '[]'$  History of major depression.   Physical Examination  BP (!) 168/90 (BP Location: Left Arm)   Pulse 90   Resp 16   Ht 5' 5.5" (1.664 m)   Wt 227 lb (103 kg)   BMI 37.20 kg/m  Gen:  WD/WN, NAD Head: [place]/AT, No temporalis wasting. Ear/Nose/Throat: Hearing grossly intact, nares w/o erythema or drainage, trachea midline Eyes: Conjunctiva clear. Sclera non-icteric Neck: Supple.  No JVD.  Pulmonary:  Good air movement, no use of accessory muscles.  Cardiac: RRR Vascular:  Vessel Right Left  Radial Palpable Palpable                                   Gastrointestinal: soft, non-tender/non-distended. No guarding/reflex.  Musculoskeletal: M/S 5/5 throughout.  No deformity or atrophy Neurologic: Sensation grossly intact in extremities.  Symmetrical.  Speech is fluent.  Psychiatric: Judgment intact, Mood & affect appropriate for pt's clinical situation. Dermatologic: No rashes or ulcers noted.  No cellulitis or open wounds. Lymph : No Cervical, Axillary, or Inguinal lymphadenopathy.      Labs Recent Results (from the past 2160 hour(s))  CBC with Differential     Status: None   Collection Time: 04/28/16  9:40 AM  Result Value Ref Range    WBC 7.5 3.6 - 11.0 K/uL   RBC 4.48 3.80 - 5.20 MIL/uL   Hemoglobin 13.6 12.0 - 16.0 g/dL   HCT 39.5 35.0 - 47.0 %   MCV 88.1 80.0 - 100.0 fL   MCH 30.3 26.0 - 34.0 pg   MCHC 34.4 32.0 - 36.0 g/dL   RDW 14.1 11.5 - 14.5 %   Platelets 290 150 - 440 K/uL   Neutrophils Relative %  68 %   Neutro Abs 5.1 1.4 - 6.5 K/uL   Lymphocytes Relative 19 %   Lymphs Abs 1.4 1.0 - 3.6 K/uL   Monocytes Relative 10 %   Monocytes Absolute 0.7 0.2 - 0.9 K/uL   Eosinophils Relative 2 %   Eosinophils Absolute 0.1 0 - 0.7 K/uL   Basophils Relative 1 %   Basophils Absolute 0.1 0 - 0.1 K/uL  Comprehensive metabolic panel     Status: Abnormal   Collection Time: 04/28/16  9:40 AM  Result Value Ref Range   Sodium 134 (L) 135 - 145 mmol/L   Potassium 3.4 (L) 3.5 - 5.1 mmol/L   Chloride 96 (L) 101 - 111 mmol/L   CO2 29 22 - 32 mmol/L   Glucose, Bld 98 65 - 99 mg/dL   BUN 15 6 - 20 mg/dL   Creatinine, Ser 0.84 0.44 - 1.00 mg/dL   Calcium 9.9 8.9 - 10.3 mg/dL   Total Protein 7.3 6.5 - 8.1 g/dL   Albumin 4.3 3.5 - 5.0 g/dL   AST 23 15 - 41 U/L   ALT 20 14 - 54 U/L   Alkaline Phosphatase 73 38 - 126 U/L   Total Bilirubin 0.6 0.3 - 1.2 mg/dL   GFR calc non Af Amer >60 >60 mL/min   GFR calc Af Amer >60 >60 mL/min    Comment: (NOTE) The eGFR has been calculated using the CKD EPI equation. This calculation has not been validated in all clinical situations. eGFR's persistently <60 mL/min signify possible Chronic Kidney Disease.    Anion gap 9 5 - 15  Cancer antigen 27.29     Status: None   Collection Time: 04/28/16  9:40 AM  Result Value Ref Range   CA 27.29 20.8 0.0 - 38.6 U/mL    Comment: (NOTE) [employer]/ACS methodology Performed At: [facility] [place], [place] [id number] Lindon Romp MD CW:[id number]   VAS US CAROTID     Status: None (In process)   Collection Time: 07/26/16 11:35 AM  Result Value Ref Range   Right CCA prox sys 111 cm/s   Right CCA prox dias 10  cm/s   Right cca dist sys -110 cm/s   Left CCA prox sys -106 cm/s   Left CCA prox dias -14 cm/s   Left CCA dist sys -92 cm/s   Left CCA dist dias -18 cm/s   Left ICA prox sys -82 cm/s   Left ICA prox dias -20 cm/s   Left ICA dist sys -72 cm/s   Left ICA dist dias -12 cm/s   RIGHT CCA MID DIAS 14.00 cm/s   RIGHT ECA DIAS -11.00 cm/s   RIGHT VERTEBRAL DIAS 12.00 cm/s   LEFT ECA DIAS -16.00 cm/s   LEFT VERTEBRAL DIAS 6.00 cm/s    Radiology No results found.    Assessment/Plan  Carotid bruit Her duplex today shows no significant carotid artery stenosis in either lower extremity with some mild intimal thickening and minimal plaque as the only abnormalities. She does not need any specific treatment for this. No further workup or evaluation is planned. I will see her back as needed.  Hypertension blood pressure control important in reducing the progression of atherosclerotic disease. On appropriate oral medications.     Leotis Pain, MD  07/26/2016 3:17 PM    This note was created with Dragon medical transcription system.  Any errors from dictation are purely unintentional

## 2016-07-26 NOTE — Assessment & Plan Note (Signed)
Her duplex today shows no significant carotid artery stenosis in either lower extremity with some mild intimal thickening and minimal plaque as the only abnormalities. She does not need any specific treatment for this. No further workup or evaluation is planned. I will see her back as needed.

## 2016-08-12 ENCOUNTER — Encounter: Payer: Self-pay | Admitting: Family Medicine

## 2016-08-12 ENCOUNTER — Ambulatory Visit (INDEPENDENT_AMBULATORY_CARE_PROVIDER_SITE_OTHER): Payer: Medicare Other | Admitting: Family Medicine

## 2016-08-12 VITALS — BP 138/80 | HR 102 | Temp 98.0°F | Resp 16 | Ht 65.5 in | Wt 221.4 lb

## 2016-08-12 DIAGNOSIS — J449 Chronic obstructive pulmonary disease, unspecified: Secondary | ICD-10-CM

## 2016-08-12 DIAGNOSIS — F3341 Major depressive disorder, recurrent, in partial remission: Secondary | ICD-10-CM

## 2016-08-12 DIAGNOSIS — R7303 Prediabetes: Secondary | ICD-10-CM | POA: Insufficient documentation

## 2016-08-12 MED ORDER — NORTRIPTYLINE HCL 10 MG PO CAPS: 10.0000 mg | ORAL_CAPSULE | Freq: Every day | ORAL | 11 refills | Status: DC

## 2016-08-12 NOTE — Progress Notes (Addendum)
Subjective:    Patient ID: Candace Tapia, female    DOB: 05-25-1935, 81 y.o.   MRN: [id number]  Candace Tapia is a 81 y.o. female presenting on 08/12/2016 for Depression (improved)   HPI   Major Depression, Recurrent: - Reviews prior history of chronic depression >30 years, has been on multiple medications in past, cannot recall each one. Admits significant family history of depression and other mental health problems. Last visit 05/2016 prior PCP started her on Nortriptyline nightly both for mood, sleep, and pain. - Today she reports significant improvement on this medication. Would like refill to continue it. Her crying spells have improved. Also chronic pain improved, has pain from chronic OA, multiple joints - Had trouble staying asleep, takes Doxyalmine OTC PRN, previously would wake up 3-4 am for bathroom and difficulty falling back asleep  History of Carotid Bruit: - Reports recently saw Vascular Surgeon Dr Lucky Cowboy, for carotid bruit, she had ultrasound of carotids, which did not show any significant stenosis or blockage was advised does not need any other intervention or follow-up. - Additionally reports had an allergic or irritation reaction to the topical gel, had redness and itching. Tried neosporin, did not try hydrocortisone  COPD Additionally describes her breathing as limiting her from feeling better with her mood, often if has difficulty with COPD her mood is worse, limiting her function at times. Followed by Dr Raul Del at [facility] - has O2 at night, not during day - her sister died with COPD - No recent flare. Denies dyspnea. Admits some dyspnea on exertion unchanged  Depression screen [facility] 2/9 12/06/2016 08/12/2016 02/08/2016  Decreased Interest 0 1 0  Down, Depressed, Hopeless 0 1 0  PHQ - 2 Score 0 2 0  Altered sleeping - 0 -  Tired, decreased energy - 2 -  Change in appetite - 0 -  Feeling bad or failure about yourself  - 0 -  Trouble  concentrating - 3 -  Moving slowly or fidgety/restless - 0 -  Suicidal thoughts - 0 -  PHQ-9 Score - 7 -  Difficult doing work/chores - Not difficult at all -    Social History  Substance Use Topics  . Smoking status: Former Research scientist (life sciences)  . Smokeless tobacco: Never Used  . Alcohol use No    Review of Systems Per HPI unless specifically indicated above     Objective:    BP 138/80   Pulse (!) 102   Temp 98 F (36.7 C)   Resp 16   Ht 5' 5.5" (1.664 m)   Wt 221 lb 6.4 oz (100.4 kg)   SpO2 98%   BMI 36.28 kg/m   Wt Readings from Last 3 Encounters:  08/12/16 221 lb 6.4 oz (100.4 kg)  07/26/16 227 lb (103 kg)  06/13/16 228 lb 9.6 oz (103.7 kg)    Physical Exam  Constitutional: She is oriented to person, place, and time. She appears well-developed and well-nourished. No distress.  Well-appearing, comfortable, cooperative  HENT:  Head: Normocephalic and atraumatic.  Mouth/Throat: Oropharynx is clear and moist.  Eyes: Conjunctivae are normal.  Neck: Normal range of motion. Neck supple. No thyromegaly present.  Subtle L>R carotid bruit heard on auscultation  Cardiovascular: Regular rhythm, normal heart sounds and intact distal pulses.   No murmur heard. Mild tachycardia, improved on exam  Pulmonary/Chest: Effort normal and breath sounds normal. No respiratory distress. She has no wheezes. She has no rales.  Lymphadenopathy:    She has no  cervical adenopathy.  Neurological: She is alert and oriented to person, place, and time.  Skin: Skin is warm and dry. No rash noted. She is not diaphoretic. No erythema.  Psychiatric: She has a normal mood and affect. Her behavior is normal.  Well groomed, good eye contact, normal speech and thoughts. Very positive mood, overall improved  Nursing note and vitals reviewed.  I have personally reviewed the following lab results from 04/28/16.  Results for orders placed or performed in visit on 04/28/16  CBC with Differential  Result Value Ref  Range   WBC 7.5 3.6 - 11.0 K/uL   RBC 4.48 3.80 - 5.20 MIL/uL   Hemoglobin 13.6 12.0 - 16.0 g/dL   HCT 39.5 35.0 - 47.0 %   MCV 88.1 80.0 - 100.0 fL   MCH 30.3 26.0 - 34.0 pg   MCHC 34.4 32.0 - 36.0 g/dL   RDW 14.1 11.5 - 14.5 %   Platelets 290 150 - 440 K/uL   Neutrophils Relative % 68 %   Neutro Abs 5.1 1.4 - 6.5 K/uL   Lymphocytes Relative 19 %   Lymphs Abs 1.4 1.0 - 3.6 K/uL   Monocytes Relative 10 %   Monocytes Absolute 0.7 0.2 - 0.9 K/uL   Eosinophils Relative 2 %   Eosinophils Absolute 0.1 0 - 0.7 K/uL   Basophils Relative 1 %   Basophils Absolute 0.1 0 - 0.1 K/uL  Comprehensive metabolic panel  Result Value Ref Range   Sodium 134 (L) 135 - 145 mmol/L   Potassium 3.4 (L) 3.5 - 5.1 mmol/L   Chloride 96 (L) 101 - 111 mmol/L   CO2 29 22 - 32 mmol/L   Glucose, Bld 98 65 - 99 mg/dL   BUN 15 6 - 20 mg/dL   Creatinine, Ser 0.84 0.44 - 1.00 mg/dL   Calcium 9.9 8.9 - 10.3 mg/dL   Total Protein 7.3 6.5 - 8.1 g/dL   Albumin 4.3 3.5 - 5.0 g/dL   AST 23 15 - 41 U/L   ALT 20 14 - 54 U/L   Alkaline Phosphatase 73 38 - 126 U/L   Total Bilirubin 0.6 0.3 - 1.2 mg/dL   GFR calc non Af Amer >60 >60 mL/min   GFR calc Af Amer >60 >60 mL/min   Anion gap 9 5 - 15  Cancer antigen 27.29  Result Value Ref Range   CA 27.29 20.8 0.0 - 38.6 U/mL      Assessment & Plan:   Problem List Items Addressed This Visit    Major depression in partial remission (HCC) - Primary    Improved mood and insomnia, also pain on TCA. Chronic history of MDD / mental health problems, including family for >30 yr Failed multiple meds- unsure list - Improved PHQ  Plan: 1. Refilled Nortriptyline 10mg  nightly - counseling on caution with potential side effects on TCA in elderly patient. Considered dose increase to 20mg , however caution again, and agree to keep at lowest dose for now as long as doing well, if needed may try double dose at home x 2 of the 10mg  tabs for 1-2 weeks as trial in future if needs, notify  office if does 2. Follow-up 4 months Annual Physical, labs      Relevant Medications   nortriptyline (PAMELOR) 10 MG capsule   COPD (chronic obstructive pulmonary disease) with chronic bronchitis (HCC)    Chronic problem, currently stable without flare, has some functional limitations with COPD at times affecting her mood -  On night-time O2 - Continues Spiriva - Followed by [facility] Pulmonology         Meds ordered this encounter  Medications  . nortriptyline (PAMELOR) 10 MG capsule    Sig: Take 1 capsule (10 mg total) by mouth at bedtime.    Dispense:  30 capsule    Refill:  11      Follow up plan: Return in about 4 months (around 12/12/2016) for Annual Physical.  Nobie Putnam, DO [facility] 08/12/2016, 11:08 PM

## 2016-08-12 NOTE — Assessment & Plan Note (Signed)
Improved mood and insomnia, also pain on TCA. Chronic history of MDD / mental health problems, including family for >30 yr Failed multiple meds- unsure list - Improved PHQ  Plan: 1. Refilled Nortriptyline 10mg  nightly - counseling on caution with potential side effects on TCA in elderly patient. Considered dose increase to 20mg , however caution again, and agree to keep at lowest dose for now as long as doing well, if needed may try double dose at home x 2 of the 10mg  tabs for 1-2 weeks as trial in future if needs, notify office if does 2. Follow-up 4 months Annual Physical, labs

## 2016-08-12 NOTE — Patient Instructions (Signed)
Thank you for coming in to clinic today.  1. Keep up the good work - Continue Nortriptyline 10mg  nightly, refilled to pharmacy, +11 refills good for 1 year - Let me know sooner if you feel like you may need increased dose, if ready you could try taking 2 tablets for 20mg  nightly as a trial for few weeks to see if helps, then can send in rx 20mg  pill if needed  Use Spiriva EVERY day for maintenance  You will be due for FASTING BLOOD WORK (no food or drink after midnight before, only water or coffee without cream/sugar on the morning of)  - Please go ahead and schedule a "Lab Only" visit in the morning at the clinic for lab draw in 4 months before next Annual Physical - Make sure Lab Only appointment is at least 1-2 weeks before your next appointment, so that results will be available  Please schedule a follow-up appointment with Dr. Parks Ranger in 4 months Annual Physical  If you have any other questions or concerns, please feel free to call the clinic or send a message through [place]. You may also schedule an earlier appointment if necessary.  Nobie Putnam, DO [place]

## 2016-08-12 NOTE — Assessment & Plan Note (Addendum)
Chronic problem, currently stable without flare, has some functional limitations with COPD at times affecting her mood - On night-time O2 - Continues Spiriva - Followed by [facility] Pulmonology

## 2016-08-16 ENCOUNTER — Other Ambulatory Visit: Payer: Self-pay | Admitting: Family Medicine

## 2016-08-16 DIAGNOSIS — E782 Mixed hyperlipidemia: Secondary | ICD-10-CM

## 2016-08-16 DIAGNOSIS — Z0000 Encounter for general adult medical examination without abnormal findings: Secondary | ICD-10-CM

## 2016-08-16 DIAGNOSIS — R7303 Prediabetes: Secondary | ICD-10-CM

## 2016-08-16 DIAGNOSIS — I10 Essential (primary) hypertension: Secondary | ICD-10-CM

## 2016-08-20 DIAGNOSIS — J449 Chronic obstructive pulmonary disease, unspecified: Secondary | ICD-10-CM | POA: Diagnosis not present

## 2016-09-20 DIAGNOSIS — J449 Chronic obstructive pulmonary disease, unspecified: Secondary | ICD-10-CM | POA: Diagnosis not present

## 2016-10-03 ENCOUNTER — Other Ambulatory Visit: Payer: Medicare Other

## 2016-10-03 ENCOUNTER — Ambulatory Visit
Admission: RE | Admit: 2016-10-03 | Discharge: 2016-10-03 | Disposition: A | Discharge status: Home / Self Care | Payer: Medicare Other | Source: Ambulatory Visit | Attending: Hematology and Oncology | Admitting: Hematology and Oncology

## 2016-10-03 DIAGNOSIS — M85862 Other specified disorders of bone density and structure, left lower leg: Secondary | ICD-10-CM

## 2016-10-03 DIAGNOSIS — Z780 Asymptomatic menopausal state: Secondary | ICD-10-CM | POA: Diagnosis not present

## 2016-10-03 DIAGNOSIS — C50912 Malignant neoplasm of unspecified site of left female breast: Secondary | ICD-10-CM | POA: Insufficient documentation

## 2016-10-03 DIAGNOSIS — Z170 Estrogen receptor positive status [ER+]: Secondary | ICD-10-CM | POA: Insufficient documentation

## 2016-10-03 DIAGNOSIS — M8588 Other specified disorders of bone density and structure, other site: Secondary | ICD-10-CM | POA: Diagnosis not present

## 2016-10-03 DIAGNOSIS — R928 Other abnormal and inconclusive findings on diagnostic imaging of breast: Secondary | ICD-10-CM | POA: Diagnosis not present

## 2016-10-20 DIAGNOSIS — J449 Chronic obstructive pulmonary disease, unspecified: Secondary | ICD-10-CM | POA: Diagnosis not present

## 2016-10-26 DIAGNOSIS — R0609 Other forms of dyspnea: Secondary | ICD-10-CM | POA: Diagnosis not present

## 2016-10-26 DIAGNOSIS — J449 Chronic obstructive pulmonary disease, unspecified: Secondary | ICD-10-CM | POA: Diagnosis not present

## 2016-10-27 ENCOUNTER — Encounter: Payer: Self-pay | Admitting: Family Medicine

## 2016-10-27 ENCOUNTER — Inpatient Hospital Stay (HOSPITAL_BASED_OUTPATIENT_CLINIC_OR_DEPARTMENT_OTHER): Payer: Medicare Other | Admitting: Hematology and Oncology

## 2016-10-27 ENCOUNTER — Other Ambulatory Visit: Payer: Self-pay | Admitting: Family Medicine

## 2016-10-27 ENCOUNTER — Encounter: Payer: Self-pay | Admitting: Hematology and Oncology

## 2016-10-27 ENCOUNTER — Telehealth: Payer: Self-pay | Admitting: *Deleted

## 2016-10-27 ENCOUNTER — Inpatient Hospital Stay: Payer: Medicare Other | Attending: Hematology and Oncology

## 2016-10-27 VITALS — BP 132/84 | HR 93 | Temp 98.3°F | Resp 18 | Wt 232.1 lb

## 2016-10-27 DIAGNOSIS — Z9012 Acquired absence of left breast and nipple: Secondary | ICD-10-CM | POA: Insufficient documentation

## 2016-10-27 DIAGNOSIS — E871 Hypo-osmolality and hyponatremia: Secondary | ICD-10-CM

## 2016-10-27 DIAGNOSIS — C50912 Malignant neoplasm of unspecified site of left female breast: Secondary | ICD-10-CM

## 2016-10-27 DIAGNOSIS — Z923 Personal history of irradiation: Secondary | ICD-10-CM

## 2016-10-27 DIAGNOSIS — E876 Hypokalemia: Secondary | ICD-10-CM

## 2016-10-27 DIAGNOSIS — E559 Vitamin D deficiency, unspecified: Secondary | ICD-10-CM

## 2016-10-27 DIAGNOSIS — M85862 Other specified disorders of bone density and structure, left lower leg: Secondary | ICD-10-CM

## 2016-10-27 DIAGNOSIS — R7303 Prediabetes: Secondary | ICD-10-CM

## 2016-10-27 DIAGNOSIS — M8580 Other specified disorders of bone density and structure, unspecified site: Secondary | ICD-10-CM

## 2016-10-27 DIAGNOSIS — I10 Essential (primary) hypertension: Secondary | ICD-10-CM

## 2016-10-27 DIAGNOSIS — J449 Chronic obstructive pulmonary disease, unspecified: Secondary | ICD-10-CM | POA: Diagnosis not present

## 2016-10-27 DIAGNOSIS — Z96619 Presence of unspecified artificial shoulder joint: Secondary | ICD-10-CM | POA: Diagnosis not present

## 2016-10-27 DIAGNOSIS — E782 Mixed hyperlipidemia: Secondary | ICD-10-CM

## 2016-10-27 DIAGNOSIS — K219 Gastro-esophageal reflux disease without esophagitis: Secondary | ICD-10-CM | POA: Diagnosis not present

## 2016-10-27 DIAGNOSIS — Z170 Estrogen receptor positive status [ER+]: Secondary | ICD-10-CM | POA: Insufficient documentation

## 2016-10-27 DIAGNOSIS — Z87891 Personal history of nicotine dependence: Secondary | ICD-10-CM | POA: Insufficient documentation

## 2016-10-27 DIAGNOSIS — M8588 Other specified disorders of bone density and structure, other site: Secondary | ICD-10-CM

## 2016-10-27 DIAGNOSIS — Z79811 Long term (current) use of aromatase inhibitors: Secondary | ICD-10-CM | POA: Diagnosis not present

## 2016-10-27 DIAGNOSIS — R600 Localized edema: Secondary | ICD-10-CM | POA: Insufficient documentation

## 2016-10-27 LAB — COMPREHENSIVE METABOLIC PANEL
ALT: 25 U/L (ref 14–54)
AST: 27 U/L (ref 15–41)
Albumin: 4 g/dL (ref 3.5–5.0)
Alkaline Phosphatase: 75 U/L (ref 38–126)
Anion gap: 12 (ref 5–15)
BUN: 17 mg/dL (ref 6–20)
CO2: 28 mmol/L (ref 22–32)
Calcium: 9.8 mg/dL (ref 8.9–10.3)
Chloride: 91 mmol/L — ABNORMAL LOW (ref 101–111)
Creatinine, Ser: 0.9 mg/dL (ref 0.44–1.00)
GFR calc Af Amer: >60 mL/min (ref >60)
GFR calc non Af Amer: 59 mL/min — ABNORMAL LOW (ref >60)
Glucose, Bld: 102 mg/dL — ABNORMAL HIGH (ref 65–99)
Potassium: 3.1 mmol/L — ABNORMAL LOW (ref 3.5–5.1)
Sodium: 131 mmol/L — ABNORMAL LOW (ref 135–145)
Total Bilirubin: 0.6 mg/dL (ref 0.3–1.2)
Total Protein: 7 g/dL (ref 6.5–8.1)

## 2016-10-27 LAB — CBC WITH DIFFERENTIAL/PLATELET
Basophils Absolute: 0.1 10*3/uL (ref 0–0.1)
Basophils Relative: 1 %
Eosinophils Absolute: 0.2 10*3/uL (ref 0–0.7)
Eosinophils Relative: 2 %
HCT: 37.9 % (ref 35.0–47.0)
Hemoglobin: 13.4 g/dL (ref 12.0–16.0)
Lymphocytes Relative: 17 %
Lymphs Abs: 1.4 10*3/uL (ref 1.0–3.6)
MCH: 30.4 pg (ref 26.0–34.0)
MCHC: 35.4 g/dL (ref 32.0–36.0)
MCV: 86 fL (ref 80.0–100.0)
Monocytes Absolute: 0.7 10*3/uL (ref 0.2–0.9)
Monocytes Relative: 8 %
Neutro Abs: 6.1 10*3/uL (ref 1.4–6.5)
Neutrophils Relative %: 72 %
Platelets: 309 10*3/uL (ref 150–440)
RBC: 4.41 MIL/uL (ref 3.80–5.20)
RDW: 13.8 % (ref 11.5–14.5)
WBC: 8.4 10*3/uL (ref 3.6–11.0)

## 2016-10-27 MED ORDER — CHLORTHALIDONE 25 MG PO TABS: 12.5000 mg | ORAL_TABLET | Freq: Every day | ORAL | 5 refills | Status: DC

## 2016-10-27 MED ORDER — POTASSIUM CHLORIDE CRYS ER 20 MEQ PO TBCR: 20.0000 meq | EXTENDED_RELEASE_TABLET | Freq: Every day | ORAL | 6 refills | Status: DC

## 2016-10-27 NOTE — Progress Notes (Signed)
Patient offers no complaints today. 

## 2016-10-27 NOTE — Progress Notes (Signed)
[facility] day:  10/27/2016  Chief Complaint: Candace Tapia is a 81 y.o. female with stage I left breast cancer who is seen for 6 month assessment.  HPI:  The patient was last seen in the medical oncology clinic on 04/28/2016.  At that time, she was seen for initial assessment.  She had arthritis in her back, hips and knees.  She had rare hot flashes.  Exam was unremarkable.  CBC, CMP, and CA27.29 was normal.  Bilateral mammogram on 10/03/2016 revealed no evidence of malignancy.  Bone density study on 10/03/2016 revealed osteopenia with a T-score of -2.1 in the AP spine L1-L4.  Symptomatically, she is doing well. She denies any complaints.   Past Medical History:  Diagnosis Date  . Allergy   . Arthritis   . Breast cancer ([place]) 2014   radiation and taking exemestane  . Cancer of left breast ([place]) 10/27/2014  . COPD (chronic obstructive pulmonary disease) ([place])   . GERD (gastroesophageal reflux disease)   . H/O: hysterectomy   . History of lumpectomy 2014   LT breast  . Hypertension     Past Surgical History:  Procedure Laterality Date  . ABDOMINAL HYSTERECTOMY    . BREAST LUMPECTOMY Left 2014  . BREAST SURGERY Left 2014  . JOINT REPLACEMENT    . LYMPH NODE DISSECTION     neck  . TOTAL SHOULDER REPLACEMENT Bilateral 2010 and 2013    Family History  Problem Relation Age of Onset  . Breast cancer Mother 17  . Kidney disease Father   . Breast cancer Sister 52    Social History:  reports that she has quit smoking. She has never used smokeless tobacco. She reports that she does not drink alcohol or use drugs.  The patient lives in [place].  She lives alone.  The patient is alone today.  Allergies:  Allergies  Allergen Reactions  . Tramadol Hcl Itching    Per pt it caused me to itch all over  . Penicillins Other (See Comments)  . Statins     Not sure what exact med was; just made her feel bad (can't elaborate)  .  Penicillin G Rash  . Sulfa Antibiotics Other (See Comments) and Rash    Current Medications: Current Outpatient Prescriptions  Medication Sig Dispense Refill  . albuterol (PROAIR HFA) 108 (90 BASE) MCG/ACT inhaler Inhale into the lungs.    Marland Kitchen amLODipine (NORVASC) 5 MG tablet TAKE ONE (1) TABLET EACH DAY 90 tablet 3  . chlorthalidone (HYGROTON) 25 MG tablet TAKE ONE TABLET BY MOUTH EVERY DAY 30 tablet 6  . exemestane (AROMASIN) 25 MG tablet Take 1 tablet (25 mg total) by mouth daily. 90 tablet 1  . fexofenadine (ALLEGRA ALLERGY) 180 MG tablet Take by mouth as needed. Reported on 09/29/2015    . fluticasone (FLONASE) 50 MCG/ACT nasal spray Place into both nostrils as needed.     Marland Kitchen losartan (COZAAR) 100 MG tablet Take 1 tablet (100 mg total) by mouth daily. 90 tablet 3  . naproxen sodium (ALEVE) 220 MG tablet Take 220 mg by mouth 4 (four) times daily.    Marland Kitchen NEXIUM 20 MG capsule Take 1 capsule (20 mg total) by mouth daily. 90 capsule 3  . nortriptyline (PAMELOR) 10 MG capsule Take 1 capsule (10 mg total) by mouth at bedtime. 30 capsule 11  . tiotropium (SPIRIVA HANDIHALER) 18 MCG inhalation capsule Place 18 mcg into inhaler and inhale daily.     Marland Kitchen  calcium gluconate 650 MG tablet Take 650 mg by mouth daily.    . Multiple Vitamins-Minerals (MULTIVITAMIN WOMEN 50+) TABS Take 2 tablets by mouth daily.    . potassium chloride SA (K-DUR,KLOR-CON) 20 MEQ tablet Take 1 tablet (20 mEq total) by mouth daily. (Patient not taking: Reported on 10/27/2016) 30 tablet 6   No current facility-administered medications for this visit.     Review of Systems:  GENERAL:  Feels "fine".  No fevers or sweats.  Weight up 11 pounds. PERFORMANCE STATUS (ECOG):  1 HEENT:  No visual changes, runny nose, sore throat, mouth sores or tenderness. Lungs: Shortness of breath with exertion.  No cough.  No hemoptysis.  COPD on chronic oxygen 2.5 liters vis [place]. Cardiac:  No chest pain, palpitations, orthopnea, or PND. GI:  No  nausea, vomiting, diarrhea, constipation, melena or hematochezia. GU:  No urgency, frequency, dysuria, or hematuria. Musculoskeletal:  Chronic back, hip, and knee pain secondary to arthritis.  No muscle tenderness. Extremities:  No pain or swelling. Skin:  No rashes or skin changes. Neuro:  No headache, numbness or weakness, balance or coordination issues. Endocrine:  No diabetes, thyroid issues.  Some hot flashes.  No night sweats. Psych:  No mood changes, depression or anxiety. Pain:  No focal pain. Review of systems:  All other systems reviewed and found to be negative.  Physical Exam: Blood pressure 132/84, pulse 93, temperature 98.3 F (36.8 C), temperature source Tympanic, resp. rate 18, weight 232 lb 1 oz (105.3 kg). GENERAL:  Well developed, well nourished, woman sitting comfortably in the exam room in no acute distress. MENTAL STATUS:  Alert and oriented to person, place and time. HEAD:  Short curly gray hair.  Normocephalic, atraumatic, face symmetric, no Cushingoid features. EYES:  Glasses.  Blue eyes.  Pupils equal round and reactive to light and accomodation.  No conjunctivitis or scleral icterus. ENT:  Oropharynx clear without lesion.  Tongue normal. Mucous membranes moist.  RESPIRATORY:  Clear to auscultation without rales, wheezes or rhonchi. CARDIOVASCULAR:  Regular rate and rhythm without murmur, rub or gallop. BREAST:  Right breast with fibrocystic changes at 9 o'clock position.  No masses, skin changes or nipple discharge.  Left breast with horizontal incision superiorly.  Tender edema and fibrocystic changes inferiorly.  No masses, skin changes or nipple discharge.  ABDOMEN:  Soft, non-tender, with active bowel sounds, and no appreciable hepatosplenomegaly.  No masses. SKIN:  No rashes, ulcers or lesions. EXTREMITIES: No edema, no skin discoloration or tenderness.  No palpable cords. LYMPH NODES: No palpable cervical, supraclavicular, axillary or inguinal adenopathy   NEUROLOGICAL: Unremarkable. PSYCH:  Appropriate.   Appointment on 10/27/2016  Component Date Value Ref Range Status  . Sodium 10/27/2016 131* 135 - 145 mmol/L Final  . Potassium 10/27/2016 3.1* 3.5 - 5.1 mmol/L Final  . Chloride 10/27/2016 91* 101 - 111 mmol/L Final  . CO2 10/27/2016 28  22 - 32 mmol/L Final  . Glucose, Bld 10/27/2016 102* 65 - 99 mg/dL Final  . BUN 10/27/2016 17  6 - 20 mg/dL Final  . Creatinine, Ser 10/27/2016 0.90  0.44 - 1.00 mg/dL Final  . Calcium 10/27/2016 9.8  8.9 - 10.3 mg/dL Final  . Total Protein 10/27/2016 7.0  6.5 - 8.1 g/dL Final  . Albumin 10/27/2016 4.0  3.5 - 5.0 g/dL Final  . AST 10/27/2016 27  15 - 41 U/L Final  . ALT 10/27/2016 25  14 - 54 U/L Final  . Alkaline Phosphatase 10/27/2016 75  38 - 126 U/L Final  . Total Bilirubin 10/27/2016 0.6  0.3 - 1.2 mg/dL Final  . GFR calc non Af Amer 10/27/2016 59* >60 mL/min Final  . GFR calc Af Amer 10/27/2016 >60  >60 mL/min Final   Comment: (NOTE) The eGFR has been calculated using the CKD EPI equation. This calculation has not been validated in all clinical situations. eGFR's persistently <60 mL/min signify possible Chronic Kidney Disease.   . Anion gap 10/27/2016 12  5 - 15 Final  . WBC 10/27/2016 8.4  3.6 - 11.0 K/uL Final  . RBC 10/27/2016 4.41  3.80 - 5.20 MIL/uL Final  . Hemoglobin 10/27/2016 13.4  12.0 - 16.0 g/dL Final  . HCT 10/27/2016 37.9  35.0 - 47.0 % Final  . MCV 10/27/2016 86.0  80.0 - 100.0 fL Final  . MCH 10/27/2016 30.4  26.0 - 34.0 pg Final  . MCHC 10/27/2016 35.4  32.0 - 36.0 g/dL Final  . RDW 10/27/2016 13.8  11.5 - 14.5 % Final  . Platelets 10/27/2016 309  150 - 440 K/uL Final  . Neutrophils Relative % 10/27/2016 72  % Final  . Neutro Abs 10/27/2016 6.1  1.4 - 6.5 K/uL Final  . Lymphocytes Relative 10/27/2016 17  % Final  . Lymphs Abs 10/27/2016 1.4  1.0 - 3.6 K/uL Final  . Monocytes Relative 10/27/2016 8  % Final  . Monocytes Absolute 10/27/2016 0.7  0.2 - 0.9 K/uL Final   . Eosinophils Relative 10/27/2016 2  % Final  . Eosinophils Absolute 10/27/2016 0.2  0 - 0.7 K/uL Final  . Basophils Relative 10/27/2016 1  % Final  . Basophils Absolute 10/27/2016 0.1  0 - 0.1 K/uL Final    Assessment:  HARTLEIGH EDMONSTON is a 81 y.o. female with stage I left breast cancer s/p partial mastectomy and sentinel lymph node biopsy on 11/20/2012.  Pathology revealed a 0.7 cm focus of grade II invasive mammary carcinoma with associated DCIS.  Margins were negative.  There was no angiolymphatic invasion was negative.  Two sentinel lymph nodes were negative.  Tumor was ER positive (> 90%), PR positive (5%) and HER-2/neu negative.  Pathologic stage was T1bN0.  She completed radiation therapy in 12/2012.  She declined genetic testing.  She began Femara  in 02/2013.  Femara was discontinued because of side effects in 08/2013.  She began Aromasin in 08/2013.  Bilateral diagnostic mammogram on 10/03/2016 revealed no evidence of malignancy.  Bone scan on 02/02/2016 revealed no evidence of metastatic disease.  There was degenerative changes in scattered joints, tip of humeral component left shoulder (? prosthetic loosening), and degenerative changes in right hip joint/right femoral head/neck.  Bone density study on 09/30/2014 revealed osteopenia with a T-score of -2.0 in L1-L4 and -0.9 in left femoral neck.  She is on calcium and vitamin D.  Bone density study on 10/03/2016 revealed osteopenia with a T-score of -2.1 in the AP spine L1-L4.  She has COPD and is on 2.5 liters oxygen vis [place].  Symptomatically, she has rare hot flashes.  Exam is unremarkable.  Plan: 1.  Labs today:  CBC with diff, CMP, CA27.29. 2.  Review interval mammogram- no evidence of malignancy. 3.  Review interval bone density study- osteopenia.  Discuss calcium, vitamin D, and consideration of bisphosphonate or Prolia.  Information on Prolia provided. 4.  Continue Aromasin. 5.  Preauth Prolia 6.  RN to call patient  about decision re: Prolia.  She has a dental check in 11/2016. 7.  RTC in 6 months for MD assessment and labs (CBC with diff, CMP, CA27.29).   Lequita Asal, MD  10/27/2016, 10:42 AM

## 2016-10-27 NOTE — Patient Instructions (Signed)

## 2016-10-27 NOTE — Addendum Note (Signed)
Addended by: Olin Hauser on: 10/27/2016 05:16 PM   Modules accepted: Orders

## 2016-10-27 NOTE — Telephone Encounter (Signed)
Called patient to inform her that N+ and K+ are a little low.  We will send labs from today to PCP.  If she has not heard from PCP in a day or so, instructed to call PCP. Verbalized understanding.

## 2016-10-27 NOTE — Telephone Encounter (Signed)
-----   Message from Lequita Asal, MD sent at 10/27/2016  2:09 PM EDT ----- Regarding: Please call patient and send CMP to PCP  Sodium slightly low. Potassium low.  She has a history of low potassium, but does not appear to be taking.  M  ----- Message ----- From: Interface, Lab In [place] Sent: 10/27/2016  10:23 AM To: Lequita Asal, MD

## 2016-10-27 NOTE — Telephone Encounter (Addendum)
Called patient back to review results from chemistry from Dr Mike Gip. Compared to prior results as well.  1. Sodium - 131, previously low normal 130s. Concern that this may be due to Chlorthalidone 25mg  daily thiazide diuretic, she states was prescribed by previous PCP due to chronic lower extremity swelling, which she still experiences without relief. She had requested refill of this medication already earlier today. I recommended reducing dose by half for dose HALF tab = Chlorthalidone 12.5mg  daily for now, may need to come off of it completely in future or adjust to weaker thiazide HCTZ.  2. Potassium - 3.1, previous again low normal < 3.5. Prior history of HypoK she was given potassium supplement by prior PCP 27mEq daily, stopped after several months did not refill and she did not keep taking it. No particular reason why. I advised her that on the fluid medicine likely lowering her potassium as well. Recommend restart potassium supplement, sent new rx K 104mEq daily to pharmacy as well to start.  3. Lower Extremity Edema - Difficult to fully evaluate over phone, did not discuss this at last visit in office with me. She was already established with Dr Leotis Pain (Vascular Surgery) for her carotid arteries, had dopplers in past including lower extremity. She is no longer followed by him. I advised her to contact his office, gave phone # and will mail letter with contact info, she should discuss swelling with him further, since most likely related to chronic venous stasis and lymphedema and if refractory to thiazide now with complication, may benefit from other more aggressive compression therapy.  4. Will place future orders for labs from our office in August 2018 to re-check chemistry Na, K (BMET), and other routine fasting labs, Lipid, A1c, Vitamin D.  Note forwarded to Dr Kem Parkinson office.  Nobie Putnam, [facility] 10/27/2016, 5:13 PM

## 2016-10-28 ENCOUNTER — Telehealth: Payer: Self-pay | Admitting: *Deleted

## 2016-10-28 LAB — CANCER ANTIGEN 27.29: CA 27.29: 14.6 U/mL (ref 0.0–38.6)

## 2016-10-28 NOTE — Telephone Encounter (Signed)
  Thanks for the update.  Melissa

## 2016-10-28 NOTE — Telephone Encounter (Signed)
-----   Message from Lequita Asal, MD sent at 10/28/2016  5:52 AM EDT ----- Regarding: Please call patient  CA27.29 normal.  M  ----- Message ----- From: Interface, Lab In [place] Sent: 10/27/2016  10:23 AM To: Lequita Asal, MD

## 2016-10-28 NOTE — Telephone Encounter (Signed)
Called patient to inform her that CA27.29 is normal.

## 2016-11-01 ENCOUNTER — Encounter (INDEPENDENT_AMBULATORY_CARE_PROVIDER_SITE_OTHER): Payer: Self-pay | Admitting: Vascular Surgery

## 2016-11-01 ENCOUNTER — Ambulatory Visit (INDEPENDENT_AMBULATORY_CARE_PROVIDER_SITE_OTHER): Payer: Medicare Other | Admitting: Vascular Surgery

## 2016-11-01 VITALS — BP 148/82 | HR 97 | Resp 22 | Ht 65.5 in | Wt 230.0 lb

## 2016-11-01 DIAGNOSIS — R0989 Other specified symptoms and signs involving the circulatory and respiratory systems: Secondary | ICD-10-CM | POA: Diagnosis not present

## 2016-11-01 DIAGNOSIS — I10 Essential (primary) hypertension: Secondary | ICD-10-CM | POA: Diagnosis not present

## 2016-11-01 DIAGNOSIS — M7989 Other specified soft tissue disorders: Secondary | ICD-10-CM | POA: Diagnosis not present

## 2016-11-01 DIAGNOSIS — E782 Mixed hyperlipidemia: Secondary | ICD-10-CM | POA: Diagnosis not present

## 2016-11-01 NOTE — Progress Notes (Signed)
MRN : [id number]  Candace Tapia is a 81 y.o. ([birth year]) female who presents with chief complaint of  Chief Complaint  Patient presents with  . Leg Swelling    bilateral lower extremity swelling  .  History of Present Illness: Patient returns today on referral from her PCP for leg swelling.  She has been previously evaluated for a carotid bruit and her carotid duplex findings were benign. She is referred back for worsening swelling of the lower extremities. This was treated with diuretic therapy earlier this year without significant improvement. She has noticed as the weather has warmed, she has gotten significant increases in her swelling over the past several months. Both lower extremities are affected. She wears compression stockings in the winter but does not like wearing them in the summer secondary to the heat. She does not have ulceration or infection. She denies fever or chills. She does try to elevate her legs and stays home a lot which has helped her swelling some. She is also started potassium replacement which she thinks is helped the swelling as well. She is no longer on the diuretic as it did not help.  Past Medical History:  Diagnosis Date  . Allergy   . Arthritis   . Breast cancer ([place]) 2014   radiation and taking exemestane  . Cancer of left breast ([place]) 10/27/2014  . COPD (chronic obstructive pulmonary disease) ([place])   . GERD (gastroesophageal reflux disease)   . H/O: hysterectomy   . History of lumpectomy 2014   LT breast  . Hypertension          Past Surgical History:  Procedure Laterality Date  . ABDOMINAL HYSTERECTOMY    . BREAST SURGERY    . JOINT REPLACEMENT    . LYMPH NODE DISSECTION     neck  . TOTAL SHOULDER REPLACEMENT Bilateral 2010 and 2013         Family History  Problem Relation Age of Onset  . Breast cancer Mother 58  . Kidney disease Father   . Breast cancer Sister 35  No bleeding or clotting  disorders  Social History     Social History  Substance Use Topics  . Smoking status: Former Research scientist (life sciences)  . Smokeless tobacco: Never Used  . Alcohol use No  No IV drug use       Allergies  Allergen Reactions  . Tramadol Hcl Itching    Per pt it caused me to itch all over  . Penicillins Other (See Comments)  . Statins     Not sure what exact med was; just made her feel bad (can't elaborate)  . Penicillin G Rash  . Sulfa Antibiotics Other (See Comments) and Rash          Current Outpatient Prescriptions  Medication Sig Dispense Refill  . albuterol (PROAIR HFA) 108 (90 BASE) MCG/ACT inhaler Inhale into the lungs.    Marland Kitchen amLODipine (NORVASC) 5 MG tablet TAKE ONE (1) TABLET EACH DAY 90 tablet 3  . calcium gluconate 650 MG tablet Take 650 mg by mouth daily.    . chlorthalidone (HYGROTON) 25 MG tablet Take 1 tablet (25 mg total) by mouth daily. 30 tablet 6  . exemestane (AROMASIN) 25 MG tablet TAKE ONE TABLET BY MOUTH EVERY DAY 90 tablet 1  . fexofenadine (ALLEGRA ALLERGY) 180 MG tablet Take by mouth as needed. Reported on 09/29/2015    . fluticasone (FLONASE) 50 MCG/ACT nasal spray Place into both nostrils as needed.     Marland Kitchen  HYDROcodone-acetaminophen (NORCO/VICODIN) 5-325 MG tablet Take 0.5-1 tablets by mouth every 6 (six) hours as needed for severe pain. 60 tablet 0  . losartan (COZAAR) 100 MG tablet Take 1 tablet (100 mg total) by mouth daily. 90 tablet 3  . Multiple Vitamins-Minerals (MULTIVITAMIN WOMEN 50+) TABS Take 2 tablets by mouth daily.    Marland Kitchen NEXIUM 20 MG capsule Take 1 capsule (20 mg total) by mouth daily. 90 capsule 3  . potassium chloride SA (K-DUR,KLOR-CON) 20 MEQ tablet Take 1 tablet (20 mEq total) by mouth daily. 30 tablet 6  . tiotropium (SPIRIVA HANDIHALER) 18 MCG inhalation capsule Place 18 mcg into inhaler and inhale daily.      No current facility-administered medications for this visit.       REVIEW OF SYSTEMS(Negative unless  checked)  Constitutional: _0 Weight loss_1 Fever_2 Chills Cardiac:_3 Chest pain_4 Chest pressure_5 Palpitations _6 Shortness of breath when laying flat _7 Shortness of breath at rest _8 Shortness of breath with exertion. Vascular: _9 Pain in legs with walking_10 Pain in legsat rest_11 Pain in legs when laying flat _12 Claudication _13 Pain in feet when walking _14 Pain in feet at rest _15 Pain in feet when laying flat _16 History of DVT _17 Phlebitis _18 Swelling in legs _19 Varicose veins _20 Non-healing ulcers Pulmonary: _21 Uses home oxygen _22 Productive cough_23 Hemoptysis _24 Wheeze _25 COPD _26 Asthma Neurologic: _27 Dizziness _28 Blackouts _29 Seizures _30 History of stroke _31 History of TIA_32 Aphasia _33 Temporary blindness_34 Dysphagia _35 Weaknessor numbness in arms _36 Weakness or numbnessin legs Musculoskeletal: _37 Arthritis _38 Joint swelling _39 Joint pain _40 Low back pain Hematologic:_41 Easy bruising_42 Easy bleeding _43 Hypercoagulable state _44 Anemic _45 Hepatitis Gastrointestinal:_46 Blood in stool_47 Vomiting blood_48 Gastroesophageal reflux/heartburn_49 Abdominal pain Genitourinary: _50 Chronic kidney disease _51 Difficulturination _52 Frequenturination _53 Burning with urination_54 Hematuria Skin: _55 Rashes _56 Ulcers _57 Wounds Psychological: _58 History of anxiety_59 History of major depression.   Physical Examination  BP (!) 148/82 (BP Location: Right Arm)   Pulse 97   Resp (!) 22   Ht 5' 5.5" (1.664 m)   Wt 230 lb (104.3 kg)   BMI 37.69 kg/m  Gen:  WD/WN, NAD. Appears younger than stated age Head: [place]/AT, No temporalis wasting. Ear/Nose/Throat: Hearing grossly intact, nares w/o erythema or drainage, trachea midline Eyes: Conjunctiva clear. Sclera non-icteric Neck: Supple.  No JVD.  Pulmonary:  Good air movement, no use of accessory muscles.  Cardiac: RRR, normal S1, S2 Vascular:  Vessel Right Left  Radial Palpable Palpable                       Popliteal 1+ Palpable 1+ Palpable  PT Not Palpable Not Palpable  DP 1+ Palpable 1+ Palpable    Musculoskeletal: M/S 5/5 throughout.  No deformity or atrophy. 1-2+ right lower extremity edema, 2+ left lower extremity edema. Neurologic: Sensation grossly intact in extremities.  Symmetrical.  Speech is fluent.  Psychiatric: Judgment intact, Mood & affect appropriate for pt's clinical situation. Dermatologic: No rashes or ulcers noted.  No cellulitis or open wounds.       Labs Recent Results (from the past 2160 hour(s))  Comprehensive metabolic panel     Status: Abnormal   Collection Time: 10/27/16 10:00 AM  Result Value Ref Range   Sodium 131 (L) 135 - 145 mmol/L   Potassium 3.1 (L) 3.5 - 5.1 mmol/L   Chloride 91 (L) 101 - 111 mmol/L   CO2 28 22 - 32 mmol/L   Glucose, Bld 102 (H) 65 - 99 mg/dL   BUN 17 6 - 20 mg/dL   Creatinine, Ser 0.90 0.44 - 1.00 mg/dL   Calcium 9.8 8.9 - 10.3 mg/dL   Total Protein 7.0 6.5 - 8.1 g/dL   Albumin 4.0 3.5 - 5.0 g/dL   AST 27 15 -  41 U/L   ALT 25 14 - 54 U/L   Alkaline Phosphatase 75 38 - 126 U/L   Total Bilirubin 0.6 0.3 - 1.2 mg/dL   GFR calc non Af Amer 59 (L) >60 mL/min   GFR calc Af Amer >60 >60 mL/min    Comment: (NOTE) The eGFR has been calculated using the CKD EPI equation. This calculation has not been validated in all clinical situations. eGFR's persistently <60 mL/min signify possible Chronic Kidney Disease.    Anion gap 12 5 - 15  CBC with Differential/Platelet     Status: None   Collection Time: 10/27/16 10:00 AM  Result Value Ref Range   WBC 8.4 3.6 - 11.0 K/uL   RBC 4.41 3.80 - 5.20 MIL/uL   Hemoglobin 13.4 12.0 - 16.0 g/dL   HCT 37.9 35.0 - 47.0 %   MCV 86.0 80.0 - 100.0 fL   MCH 30.4 26.0 - 34.0 pg   MCHC 35.4 32.0 - 36.0 g/dL   RDW 13.8 11.5 - 14.5 %   Platelets 309 150 - 440 K/uL   Neutrophils Relative % 72 %   Neutro Abs 6.1 1.4 - 6.5 K/uL   Lymphocytes Relative 17 %   Lymphs Abs 1.4 1.0 - 3.6  K/uL   Monocytes Relative 8 %   Monocytes Absolute 0.7 0.2 - 0.9 K/uL   Eosinophils Relative 2 %   Eosinophils Absolute 0.2 0 - 0.7 K/uL   Basophils Relative 1 %   Basophils Absolute 0.1 0 - 0.1 K/uL  Cancer antigen 27.29     Status: None   Collection Time: 10/27/16 10:00 AM  Result Value Ref Range   CA 27.29 14.6 0.0 - 38.6 U/mL    Comment: (NOTE) [employer]/ACS methodology Performed At: [facility] [place], [place] [id number] Lindon Romp MD QP:[id number]     Radiology Dg Bone Density  Result Date: 10/03/2016 EXAM: DUAL X-RAY ABSORPTIOMETRY (DXA) FOR BONE MINERAL DENSITY IMPRESSION: Dear Dr. Nolon Stalls, Your patient Meganne Rita completed a BMD test on 10/03/2016 using the [place] (analysis version: 14.10) manufactured by [employer]. The following summarizes the results of our evaluation. PATIENT BIOGRAPHICAL: Name: Shakthi, Scipio Patient ID: [id number] Birth Date: 1936-01-20 Height: 65.0 in. Gender: Female Exam Date: 10/03/2016 Weight: 227.8 lbs. Indications: Advanced Age, asthma, Caucasian, COPD, Family Hx of Osteoporosis, Height Loss, High Risk Meds, History of Breast Cancer, Hysterectomy, Oopherectomy Bilateral, Osteoarthritis, Postmenopausal, Vit D Defic Fractures: Treatments: Albuterol, allegra, AROMASIN, Calcium, Flonase, nexium, spiriva, Vitamin D ASSESSMENT: The BMD measured at AP Spine L1-L4 is 0.936 g/cm2 with a T-score of -2.1. This patient is considered OSTEOPENIC according to [facility]) criteria. Site Region Measured Measured WHO Young Adult BMD Date       Age      Classification T-score AP Spine L1-L4 10/03/2016 79.2 Osteopenia -2.1 0.936 g/cm2 AP Spine L1-L4 09/30/2014 77.1 Osteopenia -2.0 0.953 g/cm2 AP Spine L1-L4 11/06/2007 70.2 Osteopenia -1.6 0.995 g/cm2 AP Spine L1-L4 10/31/2006 69.2 Osteopenia -1.5 1.015 g/cm2 DualFemur Neck Left 10/03/2016 79.2 Normal -0.8 0.923 g/cm2 DualFemur Neck Left  09/30/2014 77.1 Normal -0.9 0.917 g/cm2 DualFemur Neck Left 11/06/2007 70.2 Normal -0.7 0.944 g/cm2 DualFemur Neck Left 10/31/2006 69.2 Normal -0.6 0.948 g/cm2 World Health Organization [facility] criteria for post-menopausal, Caucasian Women: Normal:       T-score at or above -1 SD Osteopenia:   T-score between -1 and -2.5 SD Osteoporosis: T-score at or below -2.5 SD RECOMMENDATIONS: National Osteoporosis  Foundation recommends that FDA-approved medical therapies be considered in postmenopausal women and men age 82 or older with a: 1. Hip or vertebral (clinical or morphometric) fracture. 2. T-score of < -2.5 at the spine or hip. 3. Ten-year fracture probability by FRAX of 3% or greater for hip fracture or 20% or greater for major osteoporotic fracture. All treatment decisions require clinical judgment and consideration of individual patient factors, including patient preferences, co-morbidities, previous drug use, risk factors not captured in the FRAX model (e.g. falls, vitamin D deficiency, increased bone turnover, interval significant decline in bone density) and possible under - or over-estimation of fracture risk by FRAX. All patients should ensure an adequate intake of dietary calcium (1200 mg/d) and vitamin D (800 IU daily) unless contraindicated. FOLLOW-UP: People with diagnosed cases of osteoporosis or at high risk for fracture should have regular bone mineral density tests. For patients eligible for Medicare, routine testing is allowed once every 2 years. The testing frequency can be increased to one year for patients who have rapidly progressing disease, those who are receiving or discontinuing medical therapy to restore bone mass, or have additional risk factors. I have reviewed this report, and agree with the above findings. Mark A. Thornton Papas, M.D. [facility] Radiology Dear Dr. Nolon Stalls, Your patient JIMMA ORTMAN completed a FRAX assessment on 10/03/2016 using the [place] (analysis  version: 14.10) manufactured by [employer]. The following summarizes the results of our evaluation. PATIENT BIOGRAPHICAL: Name: Melodie, Ashworth Patient ID: [id number] Birth Date: [birth year] Height:    65.0 in. Gender: [age]    [age]       Weight:    227.8 lbs. Ethnicity:  White  Exam Date: 10/03/2016 FRAX* RESULTS:  (version: 3.5) 10-year Probability of Fracture1 Major Osteoporotic Fracture2 Hip Fracture 9.7% 1.6% Population: [place] (Caucasian) Risk Factors: None Based on Femur (Left) Neck BMD 1 -The 10-year probability of fracture may be lower than reported if the patient has received treatment. 2 -Major Osteoporotic Fracture: Clinical Spine, Forearm, Hip or Shoulder *FRAX is a Materials engineer of the [employer] for Metabolic Bone Disease, a [place] (WHO) [facility]. ASSESSMENT: The probability of a major osteoporotic fracture is 9.7% within the next ten years. The probability of a hip fracture is 1.6% within the next ten years. Mark A. Thornton Papas, M.D. [facility] Radiology Electronically Signed   By: Lavonia Dana M.D.   On: 10/03/2016 12:05   Mm Diag Breast Tomo Bilateral  Result Date: 10/03/2016 CLINICAL DATA:  History of left breast cancer in 2014 status post breast conservation surgery. EXAM: 2D DIGITAL DIAGNOSTIC BILATERAL MAMMOGRAM WITH CAD AND ADJUNCT TOMO COMPARISON:  Previous exam(s). ACR Breast Density Category b: There are scattered areas of fibroglandular density. FINDINGS: There are stable postsurgical changes within the left breast. There are no new dominant masses, suspicious calcifications or secondary signs of malignancy within either breast. Mammographic images were processed with CAD. IMPRESSION: No evidence of malignancy within either breast. Stable postsurgical changes within the left breast. RECOMMENDATION: Bilateral diagnostic mammogram in 1 year. I have discussed the findings and recommendations with the patient. Results  were also provided in writing at the conclusion of the visit. If applicable, a reminder letter will be sent to the patient regarding the next appointment. BI-RADS CATEGORY  2: Benign. Electronically Signed   By: Franki Cabot M.D.   On: 10/03/2016 10:33     Assessment/Plan Carotid bruit Her duplex  today earlier this year showed no significant carotid artery stenosis in either lower extremity with some mild intimal thickening and minimal plaque as the only abnormalities. She does not need any specific treatment for this. No further workup or evaluation is planned. I will see her back as needed.  Hypertension blood pressure control important in reducing the progression of atherosclerotic disease. On appropriate oral medications.  Hyperlipidemia, unspecified lipid control important in reducing the progression of atherosclerotic disease.   Swelling of limb The patient has lower extremity swelling that is significant and impairs their normal activities. Discussed the multiple causes of lower extremity swelling. We discussed the pathophysiology and natural history of both venous disease and lymphedema as potential vascular causes of lower extremity swelling. We will obtain a venous reflux study at the patient's convenience in the near future. We will see the patient back following the study to discuss the results and determine further treatment options. Conservative therapies for leg swelling would include increasing exercise, leg elevation, weight loss, and the daily use of graduated compression stockings 20-30 mmHg and strength. We discussed using the stockings on a daily basis starting first thing in the morning and wearing them throughout the day. These should be worn daily.    Leotis Pain, MD  11/01/2016 9:55 AM    This note was created with Dragon medical transcription system.  Any errors from dictation are purely unintentional

## 2016-11-01 NOTE — Patient Instructions (Signed)
Edema Edema is when you have too much fluid in your body or under your skin. Edema may make your legs, feet, and ankles swell up. Swelling is also common in looser tissues, like around your eyes. This is a common condition. It gets more common as you get older. There are many possible causes of edema. Eating too much salt (sodium) and being on your feet or sitting for a long time can cause edema in your legs, feet, and ankles. Hot weather may make edema worse. Edema is usually painless. Your skin may look swollen or shiny. Follow these instructions at home:  Keep the swollen body part raised (elevated) above the level of your heart when you are sitting or lying down.  Do not sit still or stand for a long time.  Do not wear tight clothes. Do not wear garters on your upper legs.  Exercise your legs. This can help the swelling go down.  Wear elastic bandages or support stockings as told by your doctor.  Eat a low-salt (low-sodium) diet to reduce fluid as told by your doctor.  Depending on the cause of your swelling, you may need to limit how much fluid you drink (fluid restriction).  Take over-the-counter and prescription medicines only as told by your doctor. Contact a doctor if:  Treatment is not working.  You have heart, liver, or kidney disease and have symptoms of edema.  You have sudden and unexplained weight gain. Get help right away if:  You have shortness of breath or chest pain.  You cannot breathe when you lie down.  You have pain, redness, or warmth in the swollen areas.  You have heart, liver, or kidney disease and get edema all of a sudden.  You have a fever and your symptoms get worse all of a sudden. Summary  Edema is when you have too much fluid in your body or under your skin.  Edema may make your legs, feet, and ankles swell up. Swelling is also common in looser tissues, like around your eyes.  Raise (elevate) the swollen body part above the level of your  heart when you are sitting or lying down.  Follow your doctor's instructions about diet and how much fluid you can drink (fluid restriction). This information is not intended to replace advice given to you by your health care provider. Make sure you discuss any questions you have with your health care provider. Document Released: 09/21/2007 Document Revised: 04/22/2016 Document Reviewed: 04/22/2016 Elsevier Interactive Patient Education  2017 Elsevier Inc.  

## 2016-11-01 NOTE — Assessment & Plan Note (Signed)
lipid control important in reducing the progression of atherosclerotic disease.   

## 2016-11-01 NOTE — Assessment & Plan Note (Signed)
The patient has lower extremity swelling that is significant and impairs their normal activities. Discussed the multiple causes of lower extremity swelling. We discussed the pathophysiology and natural history of both venous disease and lymphedema as potential vascular causes of lower extremity swelling. We will obtain a venous reflux study at the patient's convenience in the near future. We will see the patient back following the study to discuss the results and determine further treatment options. Conservative therapies for leg swelling would include increasing exercise, leg elevation, weight loss, and the daily use of graduated compression stockings 20-30 mmHg and strength. We discussed using the stockings on a daily basis starting first thing in the morning and wearing them throughout the day. These should be worn daily. 

## 2016-11-20 DIAGNOSIS — J449 Chronic obstructive pulmonary disease, unspecified: Secondary | ICD-10-CM | POA: Diagnosis not present

## 2016-11-25 ENCOUNTER — Ambulatory Visit (INDEPENDENT_AMBULATORY_CARE_PROVIDER_SITE_OTHER): Payer: Medicare Other | Admitting: Vascular Surgery

## 2016-11-25 ENCOUNTER — Ambulatory Visit (INDEPENDENT_AMBULATORY_CARE_PROVIDER_SITE_OTHER): Payer: Medicare Other

## 2016-11-25 ENCOUNTER — Encounter (INDEPENDENT_AMBULATORY_CARE_PROVIDER_SITE_OTHER): Payer: Self-pay | Admitting: Vascular Surgery

## 2016-11-25 VITALS — BP 152/75 | HR 81 | Resp 18 | Ht 65.5 in | Wt 228.0 lb

## 2016-11-25 DIAGNOSIS — I872 Venous insufficiency (chronic) (peripheral): Secondary | ICD-10-CM | POA: Insufficient documentation

## 2016-11-25 DIAGNOSIS — M7989 Other specified soft tissue disorders: Secondary | ICD-10-CM

## 2016-11-25 DIAGNOSIS — I10 Essential (primary) hypertension: Secondary | ICD-10-CM

## 2016-11-25 NOTE — Assessment & Plan Note (Signed)
Her duplex today demonstrates no deep venous thrombosis or superficial thrombophlebitis. She does have significant venous reflux in both great saphenous veins. We had a long discussion today regarding options. Currently, she does not feels that her symptoms are severe enough to warrant any intervention and that is certainly reasonable. She should continue to elevate her legs, use compression therapy, and maintain activity as much as possible. I have offered her a follow-up visit versus calling us for symptoms worsen for an as needed visit. She prefers the latter.

## 2016-11-25 NOTE — Progress Notes (Signed)
MRN : [id number]  Candace Tapia is a 81 y.o. (1935/07/05) female who presents with chief complaint of  Chief Complaint  Patient presents with  . Follow-up    Ultrasound follow up  .  History of Present Illness: Patient returns today in follow up of leg swelling. She has begun taking potassium supplementation which has significantly improved the cramping and swelling in her legs as per her report. She does still have some swelling although she thinks it is much better. She has been elevating and compressing her legs as well. Her duplex today demonstrates no deep venous thrombosis or superficial thrombophlebitis. She does have significant venous reflux in both great saphenous veins.  Past Medical History:  Diagnosis Date  . Allergy   . Arthritis   . Breast cancer ([place]) 2014   radiation and taking exemestane  . Cancer of left breast ([place]) 10/27/2014  . COPD (chronic obstructive pulmonary disease) ([place])   . GERD (gastroesophageal reflux disease)   . H/O: hysterectomy   . History of lumpectomy 2014   LT breast  . Hypertension          Past Surgical History:  Procedure Laterality Date  . ABDOMINAL HYSTERECTOMY    . BREAST SURGERY    . JOINT REPLACEMENT    . LYMPH NODE DISSECTION     neck  . TOTAL SHOULDER REPLACEMENT Bilateral 2010 and 2013         Family History  Problem Relation Age of Onset  . Breast cancer Mother 33  . Kidney disease Father   . Breast cancer Sister 39  No bleeding or clotting disorders  Social History     Social History  Substance Use Topics  . Smoking status: Former Research scientist (life sciences)  . Smokeless tobacco: Never Used  . Alcohol use No  No IV drug use       Allergies  Allergen Reactions  . Tramadol Hcl Itching    Per pt it caused me to itch all over  . Penicillins Other (See Comments)  . Statins     Not sure what exact med was; just made her feel bad (can't elaborate)  . Penicillin G Rash  . Sulfa  Antibiotics Other (See Comments) and Rash          Current Outpatient Prescriptions  Medication Sig Dispense Refill  . albuterol (PROAIR HFA) 108 (90 BASE) MCG/ACT inhaler Inhale into the lungs.    Marland Kitchen amLODipine (NORVASC) 5 MG tablet TAKE ONE (1) TABLET EACH DAY 90 tablet 3  . calcium gluconate 650 MG tablet Take 650 mg by mouth daily.    . chlorthalidone (HYGROTON) 25 MG tablet Take 1 tablet (25 mg total) by mouth daily. 30 tablet 6  . exemestane (AROMASIN) 25 MG tablet TAKE ONE TABLET BY MOUTH EVERY DAY 90 tablet 1  . fexofenadine (ALLEGRA ALLERGY) 180 MG tablet Take by mouth as needed. Reported on 09/29/2015    . fluticasone (FLONASE) 50 MCG/ACT nasal spray Place into both nostrils as needed.     Marland Kitchen HYDROcodone-acetaminophen (NORCO/VICODIN) 5-325 MG tablet Take 0.5-1 tablets by mouth every 6 (six) hours as needed for severe pain. 60 tablet 0  . losartan (COZAAR) 100 MG tablet Take 1 tablet (100 mg total) by mouth daily. 90 tablet 3  . Multiple Vitamins-Minerals (MULTIVITAMIN WOMEN 50+) TABS Take 2 tablets by mouth daily.    Marland Kitchen NEXIUM 20 MG capsule Take 1 capsule (20 mg total) by mouth daily. 90 capsule 3  . potassium chloride  SA (K-DUR,KLOR-CON) 20 MEQ tablet Take 1 tablet (20 mEq total) by mouth daily. 30 tablet 6  . tiotropium (SPIRIVA HANDIHALER) 18 MCG inhalation capsule Place 18 mcg into inhaler and inhale daily.      No current facility-administered medications for this visit.       REVIEW OF SYSTEMS(Negative unless checked)  Constitutional: '[]'$ Weight loss'[]'$ Fever'[]'$ Chills Cardiac:'[]'$ Chest pain'[]'$ Chest pressure'[]'$ Palpitations '[]'$ Shortness of breath when laying flat '[]'$ Shortness of breath at rest '[x]'$ Shortness of breath with exertion. Vascular: '[]'$ Pain in legs with walking'[]'$ Pain in legsat rest'[]'$ Pain in legs when laying flat '[]'$ Claudication '[]'$ Pain in feet when walking '[]'$ Pain in feet at rest '[]'$ Pain in feet when laying flat '[]'$ History of  DVT '[]'$ Phlebitis '[x]'$ Swelling in legs '[]'$ Varicose veins '[]'$ Non-healing ulcers Pulmonary: '[]'$ Uses home oxygen '[]'$ Productive cough'[]'$ Hemoptysis '[]'$ Wheeze '[x]'$ COPD '[]'$ Asthma Neurologic: '[]'$ Dizziness '[]'$ Blackouts '[]'$ Seizures '[]'$ History of stroke '[]'$ History of TIA'[]'$ Aphasia '[]'$ Temporary blindness'[]'$ Dysphagia '[]'$ Weaknessor numbness in arms '[]'$ Weakness or numbnessin legs Musculoskeletal: '[x]'$ Arthritis '[]'$ Joint swelling '[x]'$ Joint pain '[x]'$ Low back pain Hematologic:'[]'$ Easy bruising'[]'$ Easy bleeding '[]'$ Hypercoagulable state '[]'$ Anemic '[]'$ Hepatitis Gastrointestinal:'[]'$ Blood in stool'[]'$ Vomiting blood'[]'$ Gastroesophageal reflux/heartburn'[]'$ Abdominal pain Genitourinary: '[]'$ Chronic kidney disease '[]'$ Difficulturination '[]'$ Frequenturination '[]'$ Burning with urination'[]'$ Hematuria Skin: '[]'$ Rashes '[]'$ Ulcers '[]'$ Wounds Psychological: '[]'$ History of anxiety'[]'$ History of major depression.   Physical Examination  BP (!) 152/75 (BP Location: Right Arm)   Pulse 81   Resp 18   Ht 5' 5.5" (1.664 m)   Wt 103.4 kg (228 lb)   BMI 37.36 kg/m  Gen:  WD/WN, NAD Head: [place]/AT, No temporalis wasting. Ear/Nose/Throat: Hearing grossly intact, nares w/o erythema or drainage, trachea midline Eyes: Conjunctiva clear. Sclera non-icteric Neck: Supple.  No JVD.  Pulmonary:  Good air movement, no use of accessory muscles.  Cardiac: RRR, normal S1, S2 Vascular:  Vessel Right Left  Radial Palpable Palpable                                    Musculoskeletal: M/S 5/5 throughout.  No deformity or atrophy. 1+ bilateral lower extremity edema. Neurologic: Sensation grossly intact in extremities.  Symmetrical.  Speech is fluent.  Psychiatric: Judgment intact, Mood & affect appropriate for pt's clinical situation. Dermatologic: No rashes or ulcers noted.  No cellulitis or open wounds.       Labs Recent Results (from the past 2160 hour(s))  Comprehensive metabolic panel     Status:  Abnormal   Collection Time: 10/27/16 10:00 AM  Result Value Ref Range   Sodium 131 (L) 135 - 145 mmol/L   Potassium 3.1 (L) 3.5 - 5.1 mmol/L   Chloride 91 (L) 101 - 111 mmol/L   CO2 28 22 - 32 mmol/L   Glucose, Bld 102 (H) 65 - 99 mg/dL   BUN 17 6 - 20 mg/dL   Creatinine, Ser 0.90 0.44 - 1.00 mg/dL   Calcium 9.8 8.9 - 10.3 mg/dL   Total Protein 7.0 6.5 - 8.1 g/dL   Albumin 4.0 3.5 - 5.0 g/dL   AST 27 15 - 41 U/L   ALT 25 14 - 54 U/L   Alkaline Phosphatase 75 38 - 126 U/L   Total Bilirubin 0.6 0.3 - 1.2 mg/dL   GFR calc non Af Amer 59 (L) >60 mL/min   GFR calc Af Amer >60 >60 mL/min    Comment: (NOTE) The eGFR has been calculated using the CKD EPI equation. This calculation has not been validated in all clinical situations. eGFR's persistently <60 mL/min signify possible Chronic Kidney Disease.    Anion gap 12 5 - 15  CBC with Differential/Platelet  Status: None   Collection Time: 10/27/16 10:00 AM  Result Value Ref Range   WBC 8.4 3.6 - 11.0 K/uL   RBC 4.41 3.80 - 5.20 MIL/uL   Hemoglobin 13.4 12.0 - 16.0 g/dL   HCT 37.9 35.0 - 47.0 %   MCV 86.0 80.0 - 100.0 fL   MCH 30.4 26.0 - 34.0 pg   MCHC 35.4 32.0 - 36.0 g/dL   RDW 13.8 11.5 - 14.5 %   Platelets 309 150 - 440 K/uL   Neutrophils Relative % 72 %   Neutro Abs 6.1 1.4 - 6.5 K/uL   Lymphocytes Relative 17 %   Lymphs Abs 1.4 1.0 - 3.6 K/uL   Monocytes Relative 8 %   Monocytes Absolute 0.7 0.2 - 0.9 K/uL   Eosinophils Relative 2 %   Eosinophils Absolute 0.2 0 - 0.7 K/uL   Basophils Relative 1 %   Basophils Absolute 0.1 0 - 0.1 K/uL  Cancer antigen 27.29     Status: None   Collection Time: 10/27/16 10:00 AM  Result Value Ref Range   CA 27.29 14.6 0.0 - 38.6 U/mL    Comment: (NOTE) [employer]/ACS methodology Performed At: [facility] [place], [place] [id number] Lindon Romp MD TM:[id number]     Radiology No results found.    Assessment/Plan Carotid bruit Her duplex  today earlier this year showed no significant carotid artery stenosis in either lower extremity with some mild intimal thickening and minimal plaque as the only abnormalities. She does not need any specific treatment for this. No further workup or evaluation is planned. I will see her back as needed.  Hypertension blood pressure control important in reducing the progression of atherosclerotic disease. On appropriate oral medications.  Hyperlipidemia, unspecified lipid control important in reducing the progression of atherosclerotic disease.   Chronic venous insufficiency Her duplex today demonstrates no deep venous thrombosis or superficial thrombophlebitis. She does have significant venous reflux in both great saphenous veins. We had a long discussion today regarding options. Currently, she does not feels that her symptoms are severe enough to warrant any intervention and that is certainly reasonable. She should continue to elevate her legs, use compression therapy, and maintain activity as much as possible. I have offered her a follow-up visit versus calling us for symptoms worsen for an as needed visit. She prefers the latter.    Leotis Pain, MD  11/25/2016 10:38 AM    This note was created with Dragon medical transcription system.  Any errors from dictation are purely unintentional

## 2016-12-05 DIAGNOSIS — I10 Essential (primary) hypertension: Secondary | ICD-10-CM | POA: Diagnosis not present

## 2016-12-05 DIAGNOSIS — E871 Hypo-osmolality and hyponatremia: Secondary | ICD-10-CM | POA: Diagnosis not present

## 2016-12-05 DIAGNOSIS — E782 Mixed hyperlipidemia: Secondary | ICD-10-CM | POA: Diagnosis not present

## 2016-12-05 DIAGNOSIS — R600 Localized edema: Secondary | ICD-10-CM | POA: Diagnosis not present

## 2016-12-05 DIAGNOSIS — R7303 Prediabetes: Secondary | ICD-10-CM | POA: Diagnosis not present

## 2016-12-05 DIAGNOSIS — E559 Vitamin D deficiency, unspecified: Secondary | ICD-10-CM | POA: Diagnosis not present

## 2016-12-06 ENCOUNTER — Ambulatory Visit (INDEPENDENT_AMBULATORY_CARE_PROVIDER_SITE_OTHER): Payer: Medicare Other

## 2016-12-06 ENCOUNTER — Other Ambulatory Visit: Payer: Medicare Other

## 2016-12-06 VITALS — BP 130/62 | HR 78 | Temp 97.8°F | Resp 16 | Ht 66.0 in | Wt 228.2 lb

## 2016-12-06 DIAGNOSIS — R600 Localized edema: Secondary | ICD-10-CM

## 2016-12-06 DIAGNOSIS — E782 Mixed hyperlipidemia: Secondary | ICD-10-CM

## 2016-12-06 DIAGNOSIS — E876 Hypokalemia: Secondary | ICD-10-CM

## 2016-12-06 DIAGNOSIS — Z170 Estrogen receptor positive status [ER+]: Principal | ICD-10-CM

## 2016-12-06 DIAGNOSIS — M85862 Other specified disorders of bone density and structure, left lower leg: Secondary | ICD-10-CM

## 2016-12-06 DIAGNOSIS — I10 Essential (primary) hypertension: Secondary | ICD-10-CM

## 2016-12-06 DIAGNOSIS — Z0000 Encounter for general adult medical examination without abnormal findings: Secondary | ICD-10-CM | POA: Diagnosis not present

## 2016-12-06 DIAGNOSIS — C50912 Malignant neoplasm of unspecified site of left female breast: Secondary | ICD-10-CM

## 2016-12-06 DIAGNOSIS — R7303 Prediabetes: Secondary | ICD-10-CM

## 2016-12-06 DIAGNOSIS — E871 Hypo-osmolality and hyponatremia: Secondary | ICD-10-CM

## 2016-12-06 DIAGNOSIS — E559 Vitamin D deficiency, unspecified: Secondary | ICD-10-CM

## 2016-12-06 NOTE — Patient Instructions (Addendum)
Ms. Hoelzer , Thank you for taking time to come for your Medicare Wellness Visit. I appreciate your ongoing commitment to your health goals. Please review the following plan we discussed and let me know if I can assist you in the future.   Screening recommendations/referrals: Colonoscopy: completed 04/19/2007 Mammogram: completed 10/03/2016 Bone Density: completed 10/03/2016 Recommended yearly ophthalmology/optometry visit for glaucoma screening and checkup Recommended yearly dental visit for hygiene and checkup  Vaccinations: Influenza vaccine: up to date, due 12/2016 Pneumococcal vaccine: up to date Tdap vaccine: up to date Shingles vaccine: up to date  Advanced directives: Advance directive discussed with you today. I have provided a copy for you to complete at home and have notarized. Once this is complete please bring a copy in to our office so we can scan it into your chart.  Conditions/risks identified: Recommend drinking at least 4-5 glasses of water a day  Next appointment: Follow up on 12/12/2016 at 2:00pm with Dr.Karamalegos. Follow up in one year for your annual wellness exam.    Preventive Care 65 Years and Older, Female Preventive care refers to lifestyle choices and visits with your health care provider that can promote health and wellness. What does preventive care include?  A yearly physical exam. This is also called an annual well check.  Dental exams once or twice a year.  Routine eye exams. Ask your health care provider how often you should have your eyes checked.  Personal lifestyle choices, including:  Daily care of your teeth and gums.  Regular physical activity.  Eating a healthy diet.  Avoiding tobacco and drug use.  Limiting alcohol use.  Practicing safe sex.  Taking low-dose aspirin every day.  Taking vitamin and mineral supplements as recommended by your health care provider. What happens during an annual well check? The services and screenings  done by your health care provider during your annual well check will depend on your age, overall health, lifestyle risk factors, and family history of disease. Counseling  Your health care provider may ask you questions about your:  Alcohol use.  Tobacco use.  Drug use.  Emotional well-being.  Home and relationship well-being.  Sexual activity.  Eating habits.  History of falls.  Memory and ability to understand (cognition).  Work and work Statistician.  Reproductive health. Screening  You may have the following tests or measurements:  Height, weight, and BMI.  Blood pressure.  Lipid and cholesterol levels. These may be checked every 5 years, or more frequently if you are over 34 years old.  Skin check.  Lung cancer screening. You may have this screening every year starting at age 45 if you have a 30-pack-year history of smoking and currently smoke or have quit within the past 79 years.  Fecal occult blood test (FOBT) of the stool. You may have this test every year starting at age 58.  Flexible sigmoidoscopy or colonoscopy. You may have a sigmoidoscopy every 5 years or a colonoscopy every 10 years starting at age 84.  Hepatitis C blood test.  Hepatitis B blood test.  Sexually transmitted disease (STD) testing.  Diabetes screening. This is done by checking your blood sugar (glucose) after you have not eaten for a while (fasting). You may have this done every 1-3 years.  Bone density scan. This is done to screen for osteoporosis. You may have this done starting at age 40.  Mammogram. This may be done every 1-2 years. Talk to your health care provider about how often you should have  regular mammograms. Talk with your health care provider about your test results, treatment options, and if necessary, the need for more tests. Vaccines  Your health care provider may recommend certain vaccines, such as:  Influenza vaccine. This is recommended every year.  Tetanus,  diphtheria, and acellular pertussis (Tdap, Td) vaccine. You may need a Td booster every 10 years.  Zoster vaccine. You may need this after age 85.  Pneumococcal 13-valent conjugate (PCV13) vaccine. One dose is recommended after age 39.  Pneumococcal polysaccharide (PPSV23) vaccine. One dose is recommended after age 59. Talk to your health care provider about which screenings and vaccines you need and how often you need them. This information is not intended to replace advice given to you by your health care provider. Make sure you discuss any questions you have with your health care provider. Document Released: 05/01/2015 Document Revised: 12/23/2015 Document Reviewed: 02/03/2015 Elsevier Interactive Patient Education  2017 [place] Prevention in the Home Falls can cause injuries. They can happen to people of all ages. There are many things you can do to make your home safe and to help prevent falls. What can I do on the outside of my home?  Regularly fix the edges of walkways and driveways and fix any cracks.  Remove anything that might make you trip as you walk through a door, such as a raised step or threshold.  Trim any bushes or trees on the path to your home.  Use bright outdoor lighting.  Clear any walking paths of anything that might make someone trip, such as rocks or tools.  Regularly check to see if handrails are loose or broken. Make sure that both sides of any steps have handrails.  Any raised decks and porches should have guardrails on the edges.  Have any leaves, snow, or ice cleared regularly.  Use sand or salt on walking paths during winter.  Clean up any spills in your garage right away. This includes oil or grease spills. What can I do in the bathroom?  Use night lights.  Install grab bars by the toilet and in the tub and shower. Do not use towel bars as grab bars.  Use non-skid mats or decals in the tub or shower.  If you need to sit down in  the shower, use a plastic, non-slip stool.  Keep the floor dry. Clean up any water that spills on the floor as soon as it happens.  Remove soap buildup in the tub or shower regularly.  Attach bath mats securely with double-sided non-slip rug tape.  Do not have throw rugs and other things on the floor that can make you trip. What can I do in the bedroom?  Use night lights.  Make sure that you have a light by your bed that is easy to reach.  Do not use any sheets or blankets that are too big for your bed. They should not hang down onto the floor.  Have a firm chair that has side arms. You can use this for support while you get dressed.  Do not have throw rugs and other things on the floor that can make you trip. What can I do in the kitchen?  Clean up any spills right away.  Avoid walking on wet floors.  Keep items that you use a lot in easy-to-reach places.  If you need to reach something above you, use a strong step stool that has a grab bar.  Keep electrical cords out of the  way.  Do not use floor polish or wax that makes floors slippery. If you must use wax, use non-skid floor wax.  Do not have throw rugs and other things on the floor that can make you trip. What can I do with my stairs?  Do not leave any items on the stairs.  Make sure that there are handrails on both sides of the stairs and use them. Fix handrails that are broken or loose. Make sure that handrails are as long as the stairways.  Check any carpeting to make sure that it is firmly attached to the stairs. Fix any carpet that is loose or worn.  Avoid having throw rugs at the top or bottom of the stairs. If you do have throw rugs, attach them to the floor with carpet tape.  Make sure that you have a light switch at the top of the stairs and the bottom of the stairs. If you do not have them, ask someone to add them for you. What else can I do to help prevent falls?  Wear shoes that:  Do not have high  heels.  Have rubber bottoms.  Are comfortable and fit you well.  Are closed at the toe. Do not wear sandals.  If you use a stepladder:  Make sure that it is fully opened. Do not climb a closed stepladder.  Make sure that both sides of the stepladder are locked into place.  Ask someone to hold it for you, if possible.  Clearly mark and make sure that you can see:  Any grab bars or handrails.  First and last steps.  Where the edge of each step is.  Use tools that help you move around (mobility aids) if they are needed. These include:  Canes.  Walkers.  Scooters.  Crutches.  Turn on the lights when you go into a dark area. Replace any light bulbs as soon as they burn out.  Set up your furniture so you have a clear path. Avoid moving your furniture around.  If any of your floors are uneven, fix them.  If there are any pets around you, be aware of where they are.  Review your medicines with your doctor. Some medicines can make you feel dizzy. This can increase your chance of falling. Ask your doctor what other things that you can do to help prevent falls. This information is not intended to replace advice given to you by your health care provider. Make sure you discuss any questions you have with your health care provider. Document Released: 01/29/2009 Document Revised: 09/10/2015 Document Reviewed: 05/09/2014 Elsevier Interactive Patient Education  2017 [employer].

## 2016-12-06 NOTE — Progress Notes (Signed)
Subjective:   Candace Tapia is a 81 y.o. female who presents for Medicare Annual (Subsequent) preventive examination.  Review of Systems: Cardiac Risk Factors include: advanced age (>85men, >27 women);obesity (BMI >30kg/m2);hypertension;dyslipidemia;family history of premature cardiovascular disease     Objective:     Vitals: BP 130/62 (BP Location: Right Arm, Patient Position: Sitting)   Pulse 78   Temp 97.8 F (36.6 C)   Resp 16   Ht 5\' 6"  (1.676 m)   Wt 228 lb 3.2 oz (103.5 kg)   BMI 36.83 kg/m   Body mass index is 36.83 kg/m.   Tobacco History  Smoking Status  . Former Smoker  . Quit date: 04/19/1993  Smokeless Tobacco  . Never Used     Counseling given: Not Answered   Past Medical History:  Diagnosis Date  . Allergy   . Arthritis   . Breast cancer ([place]) 2014   radiation and taking exemestane  . Cancer of left breast ([place]) 10/27/2014  . COPD (chronic obstructive pulmonary disease) ([place])   . GERD (gastroesophageal reflux disease)   . H/O: hysterectomy   . History of lumpectomy 2014   LT breast  . Hypertension    Past Surgical History:  Procedure Laterality Date  . ABDOMINAL HYSTERECTOMY    . BREAST LUMPECTOMY Left 2014  . BREAST SURGERY Left 2014  . JOINT REPLACEMENT    . LYMPH NODE DISSECTION     neck  . TOTAL SHOULDER REPLACEMENT Bilateral 2010 and 2013   Family History  Problem Relation Age of Onset  . Breast cancer Mother 46  . Diabetes Mother   . Kidney disease Father   . Diabetes Father   . Breast cancer Sister 13   History  Sexual Activity  . Sexual activity: Not on file    Outpatient Encounter Prescriptions as of 12/06/2016  Medication Sig  . albuterol (PROAIR HFA) 108 (90 BASE) MCG/ACT inhaler Inhale into the lungs.  Marland Kitchen amLODipine (NORVASC) 5 MG tablet TAKE ONE (1) TABLET EACH DAY  . chlorthalidone (HYGROTON) 25 MG tablet Take 0.5 tablets (12.5 mg total) by mouth daily.  Marland Kitchen exemestane (AROMASIN) 25 MG tablet Take 1 tablet (25 mg  total) by mouth daily.  . fexofenadine (ALLEGRA ALLERGY) 180 MG tablet Take by mouth as needed. Reported on 09/29/2015  . fluticasone (FLONASE) 50 MCG/ACT nasal spray Place into both nostrils as needed.   Marland Kitchen losartan (COZAAR) 100 MG tablet Take 1 tablet (100 mg total) by mouth daily.  . Multiple Vitamins-Minerals (MULTIVITAMIN WOMEN 50+) TABS Take 2 tablets by mouth daily.  . naproxen sodium (ALEVE) 220 MG tablet Take 220 mg by mouth 4 (four) times daily.  Marland Kitchen NEXIUM 20 MG capsule Take 1 capsule (20 mg total) by mouth daily.  . nortriptyline (PAMELOR) 10 MG capsule Take 1 capsule (10 mg total) by mouth at bedtime.  Marland Kitchen tiotropium (SPIRIVA HANDIHALER) 18 MCG inhalation capsule Place 18 mcg into inhaler and inhale daily.   . calcium gluconate 650 MG tablet Take 650 mg by mouth daily.  . potassium chloride SA (K-DUR,KLOR-CON) 20 MEQ tablet Take 1 tablet (20 mEq total) by mouth daily. (Patient not taking: Reported on 12/06/2016)   No facility-administered encounter medications on file as of 12/06/2016.     Activities of Daily Living In your present state of health, do you have any difficulty performing the following activities: 12/06/2016 06/13/2016  Hearing? N N  Vision? Y N  Difficulty concentrating or making decisions? Y N  Walking or  climbing stairs? Y Y  Dressing or bathing? N N  Doing errands, shopping? N N  Preparing Food and eating ? N -  Using the Toilet? N -  In the past six months, have you accidently leaked urine? N -  Do you have problems with loss of bowel control? N -  Managing your Medications? N -  Managing your Finances? N -  Housekeeping or managing your Housekeeping? N -  Some recent data might be hidden    Patient Care Team: Olin Hauser, DO as PCP - General (Family Medicine) Erby Pian, MD as Referring Physician (Specialist) Lequita Asal, MD as Referring Physician (Hematology and Oncology)    Assessment:     Exercise Activities and Dietary  recommendations Current Exercise Habits: The patient does not participate in regular exercise at present, Exercise limited by: respiratory conditions(s)  Goals    . Increase water intake          Recommend drinking at least 4-5 glasses of water a day      Fall Risk Fall Risk  12/06/2016 02/08/2016 12/28/2015 12/01/2015 10/29/2015  Falls in the past year? No No No No No  Comment - - - - -   Depression Screen PHQ 2/9 Scores 12/06/2016 08/12/2016 02/08/2016 12/28/2015  PHQ - 2 Score 0 2 0 0  PHQ- 9 Score - 7 - -  Exception Documentation - - - -     Cognitive Function     6CIT Screen 12/06/2016  What Year? 0 points  What month? 0 points  What time? 0 points  Count back from 20 0 points  Months in reverse 0 points  Repeat phrase 0 points  Total Score 0    Immunization History  Administered Date(s) Administered  . Influenza, High Dose Seasonal PF 02/24/2015, 02/09/2016  . Influenza-Unspecified 01/16/2014  . Pneumococcal Conjugate-13 08/29/2014  . Pneumococcal Polysaccharide-23 04/19/2007  . Tdap 01/17/2013  . Zoster 08/29/2014   Screening Tests Health Maintenance  Topic Date Due  . INFLUENZA VACCINE  11/16/2016  . TETANUS/TDAP  01/18/2023  . DEXA SCAN  Completed  . PNA vac Low Risk Adult  Completed      Plan:     I have personally reviewed and addressed the Medicare Annual Wellness questionnaire and have noted the following in the patient's chart:  A. Medical and social history B. Use of alcohol, tobacco or illicit drugs  C. Current medications and supplements D. Functional ability and status E.  Nutritional status F.  Physical activity G. Advance directives H. List of other physicians I.  Hospitalizations, surgeries, and ER visits in previous 12 months J.  [place] such as hearing and vision if needed, cognitive and depression L. Referrals and appointments  In addition, I have reviewed and discussed with patient certain preventive protocols, quality  metrics, and best practice recommendations. A written personalized care plan for preventive services as well as general preventive health recommendations were provided to patient.   Signed,  Tyler Aas, LPN Nurse Health Advisor   MD Recommendations:none

## 2016-12-07 ENCOUNTER — Telehealth: Payer: Self-pay | Admitting: *Deleted

## 2016-12-07 LAB — LIPID PANEL
Cholesterol: 190 mg/dL (ref <200)
HDL: 43 mg/dL — ABNORMAL LOW (ref >50)
LDL CALC: 118 mg/dL — AB (ref <100)
TRIGLYCERIDES: 143 mg/dL (ref <150)
Total CHOL/HDL Ratio: 4.4 Ratio (ref <5.0)
VLDL: 29 mg/dL (ref <30)

## 2016-12-07 LAB — BASIC METABOLIC PANEL WITH GFR
BUN: 17 mg/dL (ref 7–25)
CHLORIDE: 95 mmol/L — AB (ref 98–110)
CO2: 25 mmol/L (ref 20–32)
Calcium: 9.5 mg/dL (ref 8.6–10.4)
Creat: 0.95 mg/dL — ABNORMAL HIGH (ref 0.60–0.93)
GFR, Est African American: 66 mL/min (ref >=60)
GFR, Est Non African American: 57 mL/min — ABNORMAL LOW (ref >=60)
Glucose, Bld: 115 mg/dL — ABNORMAL HIGH (ref 65–99)
Potassium: 3.4 mmol/L — ABNORMAL LOW (ref 3.5–5.3)
SODIUM: 138 mmol/L (ref 135–146)

## 2016-12-07 LAB — HEMOGLOBIN A1C
Hgb A1c MFr Bld: 5.8 % — ABNORMAL HIGH (ref <5.7)
Mean Plasma Glucose: 120 mg/dL

## 2016-12-07 LAB — VITAMIN D 25 HYDROXY (VIT D DEFICIENCY, FRACTURES): VIT D 25 HYDROXY: 38 ng/mL (ref 30–100)

## 2016-12-07 NOTE — Telephone Encounter (Signed)
Called patient and discussed dental clearance for possible prolia injection.  Patient reports that she is seeing Dr. Ronita Hipps on 8-28 but does not want to have the injection at this time.  Patient reported that she would think about it and call back if she decided to proceed with prolia.

## 2016-12-09 ENCOUNTER — Encounter: Payer: Self-pay | Admitting: Family Medicine

## 2016-12-09 DIAGNOSIS — N183 Chronic kidney disease, stage 3 (moderate): Secondary | ICD-10-CM

## 2016-12-09 DIAGNOSIS — N182 Chronic kidney disease, stage 2 (mild): Secondary | ICD-10-CM | POA: Insufficient documentation

## 2016-12-12 ENCOUNTER — Other Ambulatory Visit: Payer: Self-pay | Admitting: Family Medicine

## 2016-12-12 ENCOUNTER — Ambulatory Visit (INDEPENDENT_AMBULATORY_CARE_PROVIDER_SITE_OTHER): Payer: Medicare Other | Admitting: Family Medicine

## 2016-12-12 ENCOUNTER — Encounter: Payer: Self-pay | Admitting: Family Medicine

## 2016-12-12 VITALS — BP 140/80 | HR 98 | Temp 97.8°F | Resp 16 | Ht 65.5 in | Wt 230.0 lb

## 2016-12-12 DIAGNOSIS — E559 Vitamin D deficiency, unspecified: Secondary | ICD-10-CM

## 2016-12-12 DIAGNOSIS — I872 Venous insufficiency (chronic) (peripheral): Secondary | ICD-10-CM | POA: Diagnosis not present

## 2016-12-12 DIAGNOSIS — E782 Mixed hyperlipidemia: Secondary | ICD-10-CM

## 2016-12-12 DIAGNOSIS — Z23 Encounter for immunization: Secondary | ICD-10-CM | POA: Diagnosis not present

## 2016-12-12 DIAGNOSIS — Z0000 Encounter for general adult medical examination without abnormal findings: Secondary | ICD-10-CM

## 2016-12-12 DIAGNOSIS — R7303 Prediabetes: Secondary | ICD-10-CM

## 2016-12-12 DIAGNOSIS — I10 Essential (primary) hypertension: Secondary | ICD-10-CM

## 2016-12-12 DIAGNOSIS — N183 Chronic kidney disease, stage 3 unspecified: Secondary | ICD-10-CM

## 2016-12-12 DIAGNOSIS — E876 Hypokalemia: Secondary | ICD-10-CM

## 2016-12-12 DIAGNOSIS — M150 Primary generalized (osteo)arthritis: Secondary | ICD-10-CM | POA: Diagnosis not present

## 2016-12-12 DIAGNOSIS — M159 Polyosteoarthritis, unspecified: Secondary | ICD-10-CM

## 2016-12-12 MED ORDER — ASPIRIN EC 81 MG PO TBEC: 81.0000 mg | DELAYED_RELEASE_TABLET | Freq: Every day | ORAL | Status: DC

## 2016-12-12 NOTE — Progress Notes (Signed)
Subjective:    Patient ID: Candace Tapia, female    DOB: 1936/02/21, 81 y.o.   MRN: [id number]  Candace Tapia is a 81 y.o. female presenting on 12/12/2016 for Annual Exam   HPI   Osteoarthritis, Multiple joints (hands, knees, hips, back) - History of OA/DJD, in past followed by Pain Management required Hydrocodone PRN - Currently still reports same complaints, worse some days, worse with rainy stormy weather - Takes regular every day OTC Aleve x 2 pills BID - Takes Acetaminophen 650mg  tab in afternoon PRN with some relief, not taking Tylenol  HTN / CKD-III - Reports unsure of prior CKD history. Last Cr mild elevated 0.95 compared to previous, with GFR 57 - She has been taking regular NSAIDs for many years, see above OA/DJD. Recently had been on Aleve OTC x 2 tabs BID. Not taking Tylenol regularly  Bilateral Lower Extremity Edema / History of Venous Insufficiency - Followed by Dr Lucky Cowboy, [facility], last seen 11/25/16, has prior history of Low K and on K supplement seemed to improve initially. She has had chronic LE edema, and tried most options. Prior LE imaging with venous reflux, no prior DVT - Today she reports still concern with edema, some days better than others, states she cannot wear compression stockings during summer due to heat, but wears them in winter, and tries to elevate feet but often cannot do this regularly - Admits she needs to improve PO hydration  History of Hypokalemia - Last checked Improved K 3.4, however she cannot take the potassium supplement pills, has been off of this since trial first 2 doses. Now has been on increased K diet, which has improved her K  Pre-Diabetes: - Prior history of mild elevated A1c / glucose. No prior dx Diabetes. - Recent A1c 5.8  History of Vitamin D Deficiency - Improved - Last test Vitamin D in normal range 38. She takes OTC Atkins vitamin supplement daily, unsure amount of vitamin D  HYPERLIPIDEMIA: -  Reports concerns with history of elevated cholesterol but cannot take statins due to intolerance and myalgia of lower extremities. Last lipid panel 11/2016, improved LDL, but lower HDL and improved TG. - Currently not on statin or ASA or other medicine  Health Maintenance: - Due for high dose Flu vaccine age >27, will get today   Past Medical History:  Diagnosis Date  . Allergy   . Arthritis   . Breast cancer ([place]) 2014   radiation and taking exemestane  . Cancer of left breast ([place]) 10/27/2014  . COPD (chronic obstructive pulmonary disease) ([place])   . GERD (gastroesophageal reflux disease)   . H/O: hysterectomy   . History of lumpectomy 2014   LT breast  . Hypertension    Past Surgical History:  Procedure Laterality Date  . ABDOMINAL HYSTERECTOMY    . BREAST LUMPECTOMY Left 2014  . BREAST SURGERY Left 2014  . JOINT REPLACEMENT    . LYMPH NODE DISSECTION     neck  . TOTAL SHOULDER REPLACEMENT Bilateral 2010 and 2013   Social History   Social History  . Marital status: Widowed    Spouse name: N/A  . Number of children: N/A  . Years of education: N/A   Occupational History  . Not on file.   Social History Main Topics  . Smoking status: Former Smoker    Quit date: 04/19/1993  . Smokeless tobacco: Never Used  . Alcohol use No  . Drug use: No  .  Sexual activity: Not on file   Other Topics Concern  . Not on file   Social History Narrative  . No narrative on file   Family History  Problem Relation Age of Onset  . Breast cancer Mother 73  . Diabetes Mother   . Kidney disease Father   . Diabetes Father   . Breast cancer Sister 33   Current Outpatient Prescriptions on File Prior to Visit  Medication Sig  . albuterol (PROAIR HFA) 108 (90 BASE) MCG/ACT inhaler Inhale into the lungs.  Marland Kitchen amLODipine (NORVASC) 5 MG tablet TAKE ONE (1) TABLET EACH DAY  . calcium gluconate 650 MG tablet Take 650 mg by mouth daily.  . chlorthalidone (HYGROTON) 25 MG tablet Take 0.5  tablets (12.5 mg total) by mouth daily.  Marland Kitchen exemestane (AROMASIN) 25 MG tablet Take 1 tablet (25 mg total) by mouth daily.  . fexofenadine (ALLEGRA ALLERGY) 180 MG tablet Take by mouth as needed. Reported on 09/29/2015  . fluticasone (FLONASE) 50 MCG/ACT nasal spray Place into both nostrils as needed.   Marland Kitchen losartan (COZAAR) 100 MG tablet Take 1 tablet (100 mg total) by mouth daily.  . Multiple Vitamins-Minerals (MULTIVITAMIN WOMEN 50+) TABS Take 2 tablets by mouth daily.  . naproxen sodium (ALEVE) 220 MG tablet Take 220 mg by mouth 4 (four) times daily.  Marland Kitchen NEXIUM 20 MG capsule Take 1 capsule (20 mg total) by mouth daily.  . nortriptyline (PAMELOR) 10 MG capsule Take 1 capsule (10 mg total) by mouth at bedtime.  Marland Kitchen tiotropium (SPIRIVA HANDIHALER) 18 MCG inhalation capsule Place 18 mcg into inhaler and inhale daily.    No current facility-administered medications on file prior to visit.     Review of Systems  Constitutional: Negative for activity change, appetite change, chills, diaphoresis, fatigue, fever and unexpected weight change.  HENT: Negative for congestion, hearing loss and sinus pressure.   Eyes: Negative for visual disturbance.  Respiratory: Negative for apnea, cough, chest tightness, shortness of breath and wheezing.   Cardiovascular: Positive for leg swelling. Negative for chest pain and palpitations.  Gastrointestinal: Negative for abdominal pain, anal bleeding, blood in stool, constipation, diarrhea, nausea and vomiting.  Endocrine: Negative for cold intolerance and polyuria.  Genitourinary: Negative for decreased urine volume, difficulty urinating, dysuria, frequency and hematuria.  Musculoskeletal: Positive for arthralgias (knees) and back pain. Negative for gait problem, joint swelling and neck pain.  Skin: Negative for rash.  Allergic/Immunologic: Negative for environmental allergies.  Neurological: Negative for dizziness, weakness, light-headedness, numbness and headaches.    Hematological: Negative for adenopathy.  Psychiatric/Behavioral: Negative for behavioral problems, dysphoric mood and sleep disturbance. The patient is not nervous/anxious.    Per HPI unless specifically indicated above     Objective:    BP 140/80 (BP Location: Right Arm, Patient Position: Sitting, Cuff Size: Normal)   Pulse 98   Temp 97.8 F (36.6 C) (Oral)   Resp 16   Ht 5' 5.5" (1.664 m)   Wt 230 lb (104.3 kg)   BMI 37.69 kg/m   Wt Readings from Last 3 Encounters:  12/12/16 230 lb (104.3 kg)  12/06/16 228 lb 3.2 oz (103.5 kg)  11/25/16 228 lb (103.4 kg)    Physical Exam  Constitutional: She is oriented to person, place, and time. She appears well-developed and well-nourished. No distress.  Well-appearing, comfortable, cooperative  HENT:  Head: Normocephalic and atraumatic.  Mouth/Throat: Oropharynx is clear and moist.  Eyes: Pupils are equal, round, and reactive to light. Conjunctivae and EOM are  normal. Right eye exhibits no discharge. Left eye exhibits no discharge.  Neck: Normal range of motion. Neck supple. No thyromegaly present.  Cardiovascular: Normal rate, regular rhythm, normal heart sounds and intact distal pulses.   No murmur heard. Pulmonary/Chest: Effort normal and breath sounds normal. No respiratory distress. She has no wheezes. She has no rales.  Abdominal: Soft. Bowel sounds are normal. She exhibits no distension and no mass. There is no tenderness.  Musculoskeletal: Normal range of motion. She exhibits no edema or tenderness.  Upper / Lower Extremities: - Normal muscle tone, strength bilateral upper extremities 5/5, lower extremities 5/5  Bilateral Knees Inspection: Mild slightly bulky appearance and symmetrical. No ecchymosis or effusion. Palpation: Mild discomfort medial joint lines bilateral. Mild crepitus ROM: Mostly full active ROM bilaterally Special Testing: Lachman / Valgus/Varus tests negative with intact ligaments (ACL, MCL, LCL) Strength:  5/5 intact knee flex/ext, ankle dorsi/plantarflex Neurovascular: distally intact sensation light touch and pulses  Low Back Inspection: Normal appearance, no spinal deformity, symmetrical. Palpation: No tenderness over spinous processes. Bilateral lumbar paraspinal muscles non-tender and with some hypertonicity/spasm. ROM: Full active ROM forward flex / back extension, rotation L/R without discomfort Special Testing: Seated SLR negative for radicular pain bilaterally Strength: Bilateral hip flex/ext 5/5, knee flex/ext 5/5, ankle dorsiflex/plantarflex 5/5 Neurovascular: intact distal sensation to light touch  Lymphadenopathy:    She has no cervical adenopathy.  Neurological: She is alert and oriented to person, place, and time.  Distal sensation intact to light touch all extremities  Skin: Skin is warm and dry. No rash noted. She is not diaphoretic. No erythema.  Psychiatric: She has a normal mood and affect. Her behavior is normal.  Well groomed, good eye contact, normal speech and thoughts  Nursing note and vitals reviewed.  Results for orders placed or performed in visit on 02/72/53  BASIC METABOLIC PANEL WITH GFR  Result Value Ref Range   Sodium 138 135 - 146 mmol/L   Potassium 3.4 (L) 3.5 - 5.3 mmol/L   Chloride 95 (L) 98 - 110 mmol/L   CO2 25 20 - 32 mmol/L   Glucose, Bld 115 (H) 65 - 99 mg/dL   BUN 17 7 - 25 mg/dL   Creat 0.95 (H) 0.60 - 0.93 mg/dL   Calcium 9.5 8.6 - 10.4 mg/dL   GFR, Est African American 66 >=60 mL/min   GFR, Est Non African American 57 (L) >=60 mL/min  Lipid panel  Result Value Ref Range   Cholesterol 190 <200 mg/dL   Triglycerides 143 <150 mg/dL   HDL 43 (L) >50 mg/dL   Total CHOL/HDL Ratio 4.4 <5.0 Ratio   VLDL 29 <30 mg/dL   LDL Cholesterol 118 (H) <100 mg/dL  Hemoglobin A1c  Result Value Ref Range   Hgb A1c MFr Bld 5.8 (H) <5.7 %   Mean Plasma Glucose 120 mg/dL  VITAMIN D 25 Hydroxy (Vit-D Deficiency, Fractures)  Result Value Ref Range   Vit  D, 25-Hydroxy 38 30 - 100 ng/mL      Assessment & Plan:   Problem List Items Addressed This Visit    Vitamin D deficiency    Resolved Continue OTC supplement      Pre-diabetes    Well-controlled Pre-DM with A1c 5.8 (stable from previous Concern with obesity, HTN, HLD  Plan:  1. Not on any therapy currently  2. Encourage improved lifestyle - low carb, low sugar diet, reduce portion size, continue improving regular exercise 3. Follow-up 3-6 months A1c  Osteoarthritis of multiple joints    Stable to gradual worsening chronic OA/DJD multiple joints Limited improvement on chronic NSAID therapy, concern affecting CKD  Plan: 1. STOP daily OTC Aleve 2. Start daily Tylenol Arthritis 650mg  x 2 per dose 1300mg  TID regularly, then may use rare NSAID PRN breakthrough short term only 3. Improve conservative therapy - Consider add muscle relaxant baclofen 4. Follow-up as needed - already on nortriptyline, allergy to tramadol, limited other options      Relevant Medications   aspirin EC 81 MG tablet   Hypertension    Mildly elevated initial BP - Home BP readings none  Complication with CKD-III   Plan:  1. Continue current BP regimen Losartan 100mg  daily, Chlorthalidone 12.5mg  daily (half tab 25), Amlodipine 5mg  2. Encourage improved lifestyle - low sodium diet, regular exercise 3. Start monitor BP outside office, bring readings to next visit, if persistently >140/90 or new symptoms notify office sooner 4. Follow-up 3 months      Relevant Medications   aspirin EC 81 MG tablet   Hyperlipidemia    Improved but still uncontrolled cholesterol on lifestyle Last lipid panel 11/2016 Calculated ASCVD 10 yr risk score >30%  Plan: 1. Discussed reducing ASCVD risk strategy - prior failed statins, declines again 2. START ASA 81mg  for primary ASCVD risk reduction 3. Encourage improved lifestyle - low carb/cholesterol, reduce portion size, continue improving regular exercise 4.  Follow-up 3-6 months, consider intermittent low dose statin vs other meds in future      Relevant Medications   aspirin EC 81 MG tablet   CKD (chronic kidney disease), stage III    Gradual decline to stable with CKD-III, GFR < 60 Concern with HTN elevated and regular NSAID use Improve BP control DC regular NSAIDs, switch to Tylenol for arthritis pain daily, very rare PRN nsaid breakthrough      Chronic venous insufficiency    Stable chronic problem, LE venous insufficiency with known venous reflux Followed by Vascular Surgery Dr Lucky Cowboy  Plan: 1. Recommend again to maximize conservative therapy - she is not adhering to compression in summer, also try to improve elevation RICE therapy, improve hydration 2. Already on thiazide, caution any further effect on kidneys with CKD 3. Avoid loop diuretics at this time with CKD 4. Follow-up as needed      Relevant Medications   aspirin EC 81 MG tablet    Other Visit Diagnoses    Annual physical exam    -  Primary   Needs flu shot       Relevant Orders   Flu vaccine HIGH DOSE PF (Completed)      Meds ordered this encounter  Medications  . clindamycin (CLEOCIN) 300 MG capsule - rx by dentist for dental work  . aspirin EC 81 MG tablet    Sig: Take 1 tablet (81 mg total) by mouth daily.     Follow up plan: Return in about 3 months (around 03/14/2017) for CKD, HTN, Arthritis, Low K (labs).  Nobie Putnam, [facility] 12/12/2016, 11:12 PM

## 2016-12-12 NOTE — Assessment & Plan Note (Addendum)
Stable to gradual worsening chronic OA/DJD multiple joints Limited improvement on chronic NSAID therapy, concern affecting CKD  Plan: 1. STOP daily OTC Aleve 2. Start daily Tylenol Arthritis 650mg  x 2 per dose 1300mg  TID regularly, then may use rare NSAID PRN breakthrough short term only 3. Improve conservative therapy - Consider add muscle relaxant baclofen 4. Follow-up as needed - already on nortriptyline, allergy to tramadol, limited other options

## 2016-12-12 NOTE — Assessment & Plan Note (Signed)
Gradual decline to stable with CKD-III, GFR < 60 Concern with HTN elevated and regular NSAID use Improve BP control DC regular NSAIDs, switch to Tylenol for arthritis pain daily, very rare PRN nsaid breakthrough

## 2016-12-12 NOTE — Assessment & Plan Note (Signed)
Stable chronic problem, LE venous insufficiency with known venous reflux Followed by Vascular Surgery Dr Lucky Cowboy  Plan: 1. Recommend again to maximize conservative therapy - she is not adhering to compression in summer, also try to improve elevation RICE therapy, improve hydration 2. Already on thiazide, caution any further effect on kidneys with CKD 3. Avoid loop diuretics at this time with CKD 4. Follow-up as needed

## 2016-12-12 NOTE — Assessment & Plan Note (Signed)
Resolved Continue OTC supplement

## 2016-12-12 NOTE — Patient Instructions (Addendum)
Thank you for coming to the clinic today.  1. Kidney function is mildly reduced, overall still functioning fairly well - STOP Aleve every day  Recommend to start taking Tylenol / Acetaminophen 650mg  tabs - take 2 tabs per dose (max 1300mg  per dose) 3 times a day, or every 6-8 hours, EVERY DAY  Then ONLY take Aleve OTC 220-250mg  1 pill twice daily with food IF WORSENING PAIN or flare up for up to 1 week max if needed. You may double the dose for 2 pills at once if absolutely needed.  2. Keep up diet for Potassium, this is improved  3. Check Bottle for Vitamin D - 1,000 to 2,000 iu daily  DUE for NON FASTING BLOOD WORK  SCHEDULE "Lab Only" visit in the morning at the clinic for lab draw in  3 MONTHS   - Make sure Lab Only appointment is at about 1 week before your next appointment, so that results will be available  For Lab Results, once available within 2-3 days of blood draw, you can can log in to MyChart online to view your results and a brief explanation. Also, we can discuss results at next follow-up visit.   Please schedule a Follow-up Appointment to: Return in about 3 months (around 03/14/2017) for CKD, HTN, Arthritis, Low K (labs).  If you have any other questions or concerns, please feel free to call the clinic or send a message through [facility]. You may also schedule an earlier appointment if necessary.  Additionally, you may be receiving a survey about your experience at our clinic within a few days to 1 week by e-mail or mail. We value your feedback.  Nobie Putnam, DO [place]

## 2016-12-12 NOTE — Assessment & Plan Note (Signed)
Well-controlled Pre-DM with A1c 5.8 (stable from previous Concern with obesity, HTN, HLD  Plan:  1. Not on any therapy currently  2. Encourage improved lifestyle - low carb, low sugar diet, reduce portion size, continue improving regular exercise 3. Follow-up 3-6 months A1c

## 2016-12-12 NOTE — Assessment & Plan Note (Signed)
Mildly elevated initial BP - Home BP readings none  Complication with CKD-III   Plan:  1. Continue current BP regimen Losartan 100mg  daily, Chlorthalidone 12.5mg  daily (half tab 25), Amlodipine 5mg  2. Encourage improved lifestyle - low sodium diet, regular exercise 3. Start monitor BP outside office, bring readings to next visit, if persistently >140/90 or new symptoms notify office sooner 4. Follow-up 3 months

## 2016-12-12 NOTE — Assessment & Plan Note (Signed)
Improved but still uncontrolled cholesterol on lifestyle Last lipid panel 11/2016 Calculated ASCVD 10 yr risk score >30%  Plan: 1. Discussed reducing ASCVD risk strategy - prior failed statins, declines again 2. START ASA 81mg  for primary ASCVD risk reduction 3. Encourage improved lifestyle - low carb/cholesterol, reduce portion size, continue improving regular exercise 4. Follow-up 3-6 months, consider intermittent low dose statin vs other meds in future

## 2016-12-21 DIAGNOSIS — J449 Chronic obstructive pulmonary disease, unspecified: Secondary | ICD-10-CM | POA: Diagnosis not present

## 2017-01-02 ENCOUNTER — Other Ambulatory Visit: Payer: Self-pay | Admitting: Family Medicine

## 2017-01-02 DIAGNOSIS — I10 Essential (primary) hypertension: Secondary | ICD-10-CM

## 2017-01-02 MED ORDER — CHLORTHALIDONE 25 MG PO TABS: 25.0000 mg | ORAL_TABLET | Freq: Every day | ORAL | 5 refills | Status: DC

## 2017-01-02 NOTE — Telephone Encounter (Signed)
Called patient to clarify dosing, she has been taking Chlorthalidone 25mg  whole tab daily and doing much better on this, requests refill to take whole pill. Also already refilled Amlodipine earlier today. New rx sent to [facility].  Nobie Putnam, [facility] 01/02/2017, 4:22 PM

## 2017-01-02 NOTE — Telephone Encounter (Signed)
Pt needs another prescription for chlorthalidone sent to [place].  Instead of taking half a tablet sent was taking whole tablet and ran out of medication early.  Her call back nkumber is [phone]

## 2017-01-03 ENCOUNTER — Other Ambulatory Visit: Payer: Self-pay | Admitting: Family Medicine

## 2017-01-03 DIAGNOSIS — F3341 Major depressive disorder, recurrent, in partial remission: Secondary | ICD-10-CM

## 2017-01-04 ENCOUNTER — Telehealth: Payer: Self-pay | Admitting: Family Medicine

## 2017-01-04 NOTE — Telephone Encounter (Signed)
Candace Tapia at [facility] said the nortriptyline 10 mg is unavailable from manufacturer right now.  They can still get 25 mg or 50 mg at this time.  Her call back number is [phone]

## 2017-01-04 NOTE — Telephone Encounter (Signed)
Called patient, reviewed options. She is not interested in inc to Nortriptyline 25mg , since in past she tried taking 2 of the 10mg  and had problems with muscle spasm and some side effects difficulty sleeping. She does not tolerate anti depressant medicine well by her report. She would like to taper off of it instead. She has enough to take Nortriptyline 10mg  every other day for next 1 week then stop.  [place] to advise we are discontinuing medication at this time.  Nobie Putnam, DO [facility] 01/04/2017, 12:21 PM

## 2017-01-20 DIAGNOSIS — J449 Chronic obstructive pulmonary disease, unspecified: Secondary | ICD-10-CM | POA: Diagnosis not present

## 2017-02-20 DIAGNOSIS — J449 Chronic obstructive pulmonary disease, unspecified: Secondary | ICD-10-CM | POA: Diagnosis not present

## 2017-02-26 ENCOUNTER — Emergency Department
Admission: EM | Admit: 2017-02-26 | Discharge: 2017-02-26 | Disposition: A | Discharge status: Home / Self Care | Payer: Medicare Other | Attending: Emergency Medicine | Admitting: Emergency Medicine

## 2017-02-26 ENCOUNTER — Other Ambulatory Visit: Payer: Self-pay

## 2017-02-26 ENCOUNTER — Emergency Department: Payer: Medicare Other

## 2017-02-26 DIAGNOSIS — J449 Chronic obstructive pulmonary disease, unspecified: Secondary | ICD-10-CM | POA: Diagnosis not present

## 2017-02-26 DIAGNOSIS — I129 Hypertensive chronic kidney disease with stage 1 through stage 4 chronic kidney disease, or unspecified chronic kidney disease: Secondary | ICD-10-CM | POA: Insufficient documentation

## 2017-02-26 DIAGNOSIS — Z853 Personal history of malignant neoplasm of breast: Secondary | ICD-10-CM | POA: Diagnosis not present

## 2017-02-26 DIAGNOSIS — Z87891 Personal history of nicotine dependence: Secondary | ICD-10-CM | POA: Insufficient documentation

## 2017-02-26 DIAGNOSIS — R110 Nausea: Secondary | ICD-10-CM | POA: Insufficient documentation

## 2017-02-26 DIAGNOSIS — N183 Chronic kidney disease, stage 3 (moderate): Secondary | ICD-10-CM | POA: Insufficient documentation

## 2017-02-26 DIAGNOSIS — Z79899 Other long term (current) drug therapy: Secondary | ICD-10-CM | POA: Insufficient documentation

## 2017-02-26 DIAGNOSIS — R112 Nausea with vomiting, unspecified: Secondary | ICD-10-CM | POA: Diagnosis not present

## 2017-02-26 DIAGNOSIS — R42 Dizziness and giddiness: Secondary | ICD-10-CM | POA: Diagnosis not present

## 2017-02-26 DIAGNOSIS — H81392 Other peripheral vertigo, left ear: Secondary | ICD-10-CM | POA: Diagnosis not present

## 2017-02-26 LAB — BASIC METABOLIC PANEL
ANION GAP: 13 (ref 5–15)
BUN: 15 mg/dL (ref 6–20)
CO2: 30 mmol/L (ref 22–32)
Calcium: 10.2 mg/dL (ref 8.9–10.3)
Chloride: 94 mmol/L — ABNORMAL LOW (ref 101–111)
Creatinine, Ser: 1.01 mg/dL — ABNORMAL HIGH (ref 0.44–1.00)
GFR calc Af Amer: 60 mL/min — ABNORMAL LOW (ref >60)
GFR calc non Af Amer: 52 mL/min — ABNORMAL LOW (ref >60)
GLUCOSE: 146 mg/dL — AB (ref 65–99)
Potassium: 3.2 mmol/L — ABNORMAL LOW (ref 3.5–5.1)
Sodium: 137 mmol/L (ref 135–145)

## 2017-02-26 LAB — CBC
HCT: 42.5 % (ref 35.0–47.0)
Hemoglobin: 14.2 g/dL (ref 12.0–16.0)
MCH: 29.4 pg (ref 26.0–34.0)
MCHC: 33.5 g/dL (ref 32.0–36.0)
MCV: 87.6 fL (ref 80.0–100.0)
PLATELETS: 278 10*3/uL (ref 150–440)
RBC: 4.85 MIL/uL (ref 3.80–5.20)
RDW: 14.2 % (ref 11.5–14.5)
WBC: 12.1 10*3/uL — AB (ref 3.6–11.0)

## 2017-02-26 LAB — TROPONIN I: Troponin I: <0.03 ng/mL (ref <0.03)

## 2017-02-26 MED ORDER — MECLIZINE HCL 25 MG PO TABS
12.5000 mg | ORAL_TABLET | Freq: Once | ORAL | Status: AC
  Administered 2017-02-26: 12.5 mg via ORAL
  Filled 2017-02-26: qty 1

## 2017-02-26 MED ORDER — POTASSIUM CHLORIDE CRYS ER 20 MEQ PO TBCR
40.0000 meq | EXTENDED_RELEASE_TABLET | Freq: Once | ORAL | Status: AC
  Administered 2017-02-26: 40 meq via ORAL

## 2017-02-26 MED ORDER — MECLIZINE HCL 25 MG PO TABS: 12.5000 mg | ORAL_TABLET | Freq: Three times a day (TID) | ORAL | 0 refills | Status: DC | PRN

## 2017-02-26 MED ORDER — ONDANSETRON 4 MG PO TBDP: 4.0000 mg | ORAL_TABLET | Freq: Four times a day (QID) | ORAL | 0 refills | Status: DC | PRN

## 2017-02-26 MED ORDER — ONDANSETRON HCL 4 MG/2ML IJ SOLN
4.0000 mg | Freq: Once | INTRAMUSCULAR | Status: AC
  Administered 2017-02-26: 4 mg via INTRAVENOUS
  Filled 2017-02-26: qty 2

## 2017-02-26 MED ORDER — POTASSIUM CHLORIDE CRYS ER 20 MEQ PO TBCR
EXTENDED_RELEASE_TABLET | ORAL | Status: AC
  Administered 2017-02-26: 40 meq via ORAL
  Filled 2017-02-26: qty 2

## 2017-02-26 NOTE — ED Provider Notes (Signed)
[facility] Emergency Department Provider Note   ____________________________________________   First MD Initiated Contact with Patient 02/26/17 1513     (approximate)  I have reviewed the triage vital signs and the nursing notes.   HISTORY  Chief Complaint Nausea (N &  with Dizziness started at noon)    HPI Candace Tapia is a 81 y.o. female history of osteoarthritis, prediabetes, hypokalemia, stage III CKD, and chronic osteoporosis  Patient presents today for evaluation of sudden onset of dizziness.  She reports that about noon today she felt like her left ear was slightly congested, at that time she used a store-bought ear cleaning kit and about the same time she used that spring saline into her ear she became suddenly extremely dizzy.  She reports that the entire room felt like it was spinning.  She had to call her son, she was unable to walk due to the severity of the dizziness reports that is starting to get somewhat better but persisting making it hard for her to walk whenever she moves she gets super dizzy.  She also reports that her hearing suddenly seemed to become dulled in the left ear.   Denies any numbness or tingling.  No weakness except for severe osteoarthritis that causes chronic weakness in both shoulders and her lower legs at the joints.  Some nausea with occasional vomiting.  No abdominal pain.  No chest pain or trouble breathing.  She uses 2 L nasal cannula at home, reports no change or respiratory symptoms  Past Medical History:  Diagnosis Date  . Allergy   . Arthritis   . Breast cancer ([place]) 2014   radiation and taking exemestane  . Cancer of left breast ([place]) 10/27/2014  . COPD (chronic obstructive pulmonary disease) ([place])   . GERD (gastroesophageal reflux disease)   . H/O: hysterectomy   . History of lumpectomy 2014   LT breast  . Hypertension     Patient Active Problem List   Diagnosis Date Noted  . CKD (chronic kidney  disease), stage III ([place]) 12/09/2016  . Chronic venous insufficiency 11/25/2016  . Swelling of limb 11/01/2016  . Bilateral lower extremity edema 10/27/2016  . Pre-diabetes 08/12/2016  . Breast cancer, stage 1, estrogen receptor positive, left ([place]) 04/28/2016  . Carotid bruit 02/09/2016  . History of left breast cancer 02/09/2016  . Long term current use of opiate analgesic 02/08/2016  . Long term prescription opiate use 02/08/2016  . Encounter for pain management planning 02/08/2016  . Drug level above therapeutic range 02/08/2016  . Chronic low back pain (Location of Tertiary source of pain) (Bilateral) (R>L) 02/08/2016  . Chronic knee pain (Location of Secondary source of pain) (Bilateral) (L>R) 02/08/2016  . History bilateral shoulder replacement 02/08/2016  . Chronic hip pain (Location of Primary Source of Pain) (Bilateral) (R>L) 01/07/2016  . Complete rupture of rotator cuff 01/07/2016  . Hyperlipidemia 01/07/2016  . Osteopenia 01/07/2016  . COPD (chronic obstructive pulmonary disease) with chronic bronchitis ([place]) 07/21/2015  . DDD (degenerative disc disease), lumbar 03/05/2015  . Lumbar facet syndrome (Bilateral) (R>L) 03/05/2015  . Sacroiliac joint dysfunction 03/05/2015  . Osteoarthritis of hip (Bilateral) (R>L) 03/05/2015  . DJD of shoulder 03/05/2015  . Abnormal finding on mammography 03/03/2015  . Back pain with radiation 03/03/2015  . CFIDS (chronic fatigue and immune dysfunction syndrome) ([place]) 03/03/2015  . Major depression in partial remission ([place]) 03/03/2015  . Dry skin 03/03/2015  . Muscle ache 03/03/2015  . Gastro-esophageal reflux  disease without esophagitis 03/03/2015  . Allergic rhinitis, seasonal 03/03/2015  . Osteoarthritis of multiple joints 03/03/2015  . Hypertension 11/28/2014  . Chronic pain 11/28/2014  . Vitamin D deficiency 11/28/2014  . Airway hyperreactivity 09/18/2013  . Microscopic hematuria 12/20/2011    Past Surgical History:  Procedure  Laterality Date  . ABDOMINAL HYSTERECTOMY    . BREAST LUMPECTOMY Left 2014  . BREAST SURGERY Left 2014  . JOINT REPLACEMENT    . LYMPH NODE DISSECTION     neck  . TOTAL SHOULDER REPLACEMENT Bilateral 2010 and 2013    Prior to Admission medications   Medication Sig Start Date End Date Taking? Authorizing Provider  albuterol (PROAIR HFA) 108 (90 BASE) MCG/ACT inhaler Inhale into the lungs.    [provider]  amLODipine (NORVASC) 5 MG tablet TAKE ONE TABLET BY MOUTH EVERY DAY 01/02/17   Parks Ranger, Devonne Doughty, DO  aspirin EC 81 MG tablet Take 1 tablet (81 mg total) by mouth daily. 12/12/16   Karamalegos, Devonne Doughty, DO  calcium gluconate 650 MG tablet Take 650 mg by mouth daily.    [provider]  chlorthalidone (HYGROTON) 25 MG tablet Take 1 tablet (25 mg total) by mouth daily. 01/02/17   Karamalegos, Devonne Doughty, DO  clindamycin (CLEOCIN) 300 MG capsule  12/09/16   [provider]  exemestane (AROMASIN) 25 MG tablet Take 1 tablet (25 mg total) by mouth daily. 04/28/16   Lequita Asal, MD  fexofenadine (ALLEGRA ALLERGY) 180 MG tablet Take by mouth as needed. Reported on 09/29/2015 01/29/14   [provider]  fluticasone (FLONASE) 50 MCG/ACT nasal spray Place into both nostrils as needed.     [provider]  losartan (COZAAR) 100 MG tablet Take 1 tablet (100 mg total) by mouth daily. 07/26/16   Karamalegos, Devonne Doughty, DO  meclizine (ANTIVERT) 25 MG tablet Take 0.5 tablets (12.5 mg total) 3 (three) times daily as needed by mouth for dizziness. 02/26/17   Delman Kitten, MD  Multiple Vitamins-Minerals (MULTIVITAMIN WOMEN 50+) TABS Take 2 tablets by mouth daily.    [provider]  naproxen sodium (ALEVE) 220 MG tablet Take 220 mg by mouth 4 (four) times daily.    [provider]  NEXIUM 20 MG capsule Take 1 capsule (20 mg total) by mouth daily. 07/09/15   Arlis Porta., MD  ondansetron (ZOFRAN ODT) 4 MG disintegrating  tablet Take 1 tablet (4 mg total) every 6 (six) hours as needed by mouth for nausea or vomiting. 02/26/17   Delman Kitten, MD  tiotropium (SPIRIVA HANDIHALER) 18 MCG inhalation capsule Place 18 mcg into inhaler and inhale daily.  07/08/14   [provider]    Allergies Tramadol hcl; Penicillins; Statins; Penicillin g; and Sulfa antibiotics  Family History  Problem Relation Age of Onset  . Breast cancer Mother 63  . Diabetes Mother   . Kidney disease Father   . Diabetes Father   . Breast cancer Sister 34    Social History Social History   Tobacco Use  . Smoking status: Former Smoker    Last attempt to quit: 04/19/1993    Years since quitting: 23.8  . Smokeless tobacco: Never Used  Substance Use Topics  . Alcohol use: No    Alcohol/week: 0.0 oz  . Drug use: No    Review of Systems Constitutional: No fever/chills Eyes: No visual changes though the world feels like it spinning on her ENT: No sore throat. Cardiovascular: Denies chest pain. Respiratory: Denies  shortness of breath. Gastrointestinal: No abdominal pain.    No diarrhea.  No constipation. Genitourinary: Negative for dysuria. Musculoskeletal: Negative for back pain. Skin: Negative for rash. Neurological: Negative for headaches, focal weakness or numbness.    ____________________________________________   PHYSICAL EXAM:  VITAL SIGNS: ED Triage Vitals  Enc Vitals Group     BP 02/26/17 1509 (!) 186/79     Pulse Rate 02/26/17 1509 86     Resp 02/26/17 1509 20     Temp 02/26/17 1509 98.2 F (36.8 C)     Temp Source 02/26/17 1509 Oral     SpO2 02/26/17 1509 95 %     Weight 02/26/17 1513 229 lb 4.8 oz (104 kg)     Height 02/26/17 1513 _0  (1.651 m)     Head Circumference --      Peak Flow --      Pain Score --      Pain Loc --      Pain Edu? --      Excl. in [place]? --     Constitutional: Alert and oriented. Well appearing and in no acute distress. Eyes: Conjunctivae are normal. Head: Atraumatic.   Normal visual fields.  No ataxia elicited with the patient sitting still. Nose: No congestion/rhinnorhea.  Right tympanic membrane is normal.  Left tympanic membrane is partially secured by cerumen, portion that is visible appears to be intact without evidence of rupture. Mouth/Throat: Mucous membranes are moist. Neck: No stridor.   Cardiovascular: Normal rate, regular rhythm. Grossly normal heart sounds.  Good peripheral circulation. Respiratory: Normal respiratory effort.  No retractions. Lungs CTAB. Gastrointestinal: Soft and nontender. No distention. Musculoskeletal: No lower extremity tenderness nor edema. Neurologic:  Normal speech and language. No gross focal neurologic deficits are appreciated.   Detailed neuro exam performed The patient has no pronator drift. The patient has normal cranial nerve exam. Extraocular movements are normal. Visual fields are normal. Patient has 5 out of 5 strength in all extremities. There is no numbness or gross, acute sensory abnormality in the extremities bilaterally. No speech disturbance. No dysarthria. No aphasia. No ataxia. Normal finger nose finger bilat. Patient speaking in full and clear sentences.  Skin:  Skin is warm, dry and intact. No rash noted. Psychiatric: Mood and affect are normal. Speech and behavior are normal.  ____________________________________________   LABS (all labs ordered are listed, but only abnormal results are displayed)  Labs Reviewed  CBC - Abnormal; Notable for the following components:      Result Value   WBC 12.1 (*)    All other components within normal limits  BASIC METABOLIC PANEL - Abnormal; Notable for the following components:   Potassium 3.2 (*)    Chloride 94 (*)    Glucose, Bld 146 (*)    Creatinine, Ser 1.01 (*)    GFR calc non Af Amer 52 (*)    GFR calc Af Amer 60 (*)    All other components within normal limits  TROPONIN I   ____________________________________________  EKG  EKG  is reviewed and entered by me at 1600 Heart rate 8949 QTC 500 Normal sinus rhythm with left bundle branch block. ____________________________________________  RADIOLOGY  Ct Head Wo Contrast  Result Date: 02/26/2017 CLINICAL DATA:  Dizziness with nausea and vomiting after attempting to clean out left ear with warm solution. EXAM: CT HEAD WITHOUT CONTRAST TECHNIQUE: Contiguous axial images were obtained from the base of the skull through the vertex without intravenous contrast. COMPARISON:  None. FINDINGS:  Brain: Ventricles, cisterns and other CSF spaces are within normal. There is moderate chronic ischemic microvascular disease present. There is no mass, mass effect, shift of midline structures or acute hemorrhage. No evidence of acute infarction. Vascular: No hyperdense vessel or unexpected calcification. Skull: Normal. Negative for fracture or focal lesion. Subtle focal opacification of the left external auditory canal. Sinuses/Orbits: No acute finding. Other: None. IMPRESSION: No acute intracranial findings. Chronic ischemic microvascular disease. Electronically Signed   By: Marin Olp M.D.   On: 02/26/2017 15:44    ____________________________________________   PROCEDURES  Procedure(s) performed: None  Procedures  Critical Care performed: No  ____________________________________________   INITIAL IMPRESSION / ASSESSMENT AND PLAN / ED COURSE  Pertinent labs & imaging results that were available during my care of the patient were reviewed by me and considered in my medical decision making (see chart for details).  Patient presents for sudden onset of severe vertigo.  Presently while at rest she reports her symptoms are somewhat better, but continues to feel nauseated.  Clinical history given is suspicious that she may have induces by potentially placing saline possibly cold in her left ear.  She reports this is a rather abrupt onset thereafter.  She also reports slightly reduced  hearing, no I cannot see the entire tympanic membrane is quite possible she could have potentially slightly ruptured the membrane.  Because of the patient's risk factors and age, I have ordered a CT of the head to evaluate further and rule out intracranial hemorrhage.  Her neurologic exam is very reassuring however, and I do not detect any evidence that suggests a stroke including posterior at this time.  VAN negative.  Denies any cardiac or respiratory symptoms.  Check EKG, basic labs, give Zofran, meclizine and continue to monitor as we await her workup.   ----------------------------------------- 4:14 PM on 02/26/2017 -----------------------------------------  Chronic hypokalemia noted.  We will replete here, patient reports this is chronic and medical record would verify this.  ----------------------------------------- 6:19 PM on 02/26/2017 -----------------------------------------  Patient resting much more comfortably now.  She continues to have some dizziness with movement and sitting up, but at this time appears consistent with peripheral vertigo.  Likely related to her left ear having placed drops in it and having a slightly congested feeling in the ear.  She is much improved now, and her son will be able to take her home.  I recommended she not drive for several days until she had follow-up with the ENT and that she is certain her vertigo symptoms are improving.  Return precautions and treatment recommendations and follow-up discussed with the patient who is agreeable with the plan.   ____________________________________________   FINAL CLINICAL IMPRESSION(S) / ED DIAGNOSES  Final diagnoses:  Peripheral vertigo involving left ear      NEW MEDICATIONS STARTED DURING THIS VISIT:  This SmartLink is deprecated. Use AVSMEDLIST instead to display the medication list for a patient.   Note:  This document was prepared using Dragon voice recognition software and may include  unintentional dictation errors.     Delman Kitten, MD 02/26/17 [phone]

## 2017-02-26 NOTE — Discharge Instructions (Signed)
We believe your symptoms were caused by benign vertigo.  Please read through the included information and take any prescribed medication(s).  Follow up with your doctor as listed above.  If you develop any new or worsening symptoms that concern you, including but not limited to persistent dizziness/vertigo, numbness or weakness in your arms or legs, altered mental status, persistent vomiting, or fever greater than 101, please return immediately to the Emergency Department.  

## 2017-02-26 NOTE — ED Notes (Addendum)
Patient does not appear to be in any acute distress at time of discharge. Patient wheeled to lobby in wheel chair.  Patient denies any comments or concerns regarding discharge.   Patient sat up at edge of bed without dizziness, walked 12 feet without any dizziness or nausea.

## 2017-02-26 NOTE — ED Triage Notes (Signed)
Patient states that she attempted to clean her left ear out with a solution she got at the store after warming the solution slightly. This was done shortly before he dizziness, nausea and vomiting earlier today, approximately 1200.  Patient does not tolerate her blood pressure being measured.

## 2017-02-28 ENCOUNTER — Encounter: Payer: Self-pay | Admitting: Nurse Practitioner

## 2017-02-28 ENCOUNTER — Ambulatory Visit (INDEPENDENT_AMBULATORY_CARE_PROVIDER_SITE_OTHER): Payer: Medicare Other | Admitting: Nurse Practitioner

## 2017-02-28 VITALS — BP 144/78 | HR 92 | Temp 97.9°F | Ht 65.0 in | Wt 228.0 lb

## 2017-02-28 DIAGNOSIS — H8102 Meniere's disease, left ear: Secondary | ICD-10-CM | POA: Diagnosis not present

## 2017-02-28 NOTE — Progress Notes (Signed)
Subjective:    Patient ID: Candace Tapia, female    DOB: Sep 08, 1935, 81 y.o.   MRN: [id number]  Candace Tapia is a 81 y.o. female presenting on 02/28/2017 for Hearing Problem (dizziness, hearing loss,tinnitis and vomiting.x 2days. Seen at the ER and diagnose w/ vertigo)   HPI ED followup: Dx Vertigo Pt presents for followup of ER visit 02/26/17 for persistent dizziness, left ear hearing loss, left ear tinnitus. Pt initially had tinnintus Sunday morning and ear fullness/congestion.  She used ear wax softening drops and irrigating saline into the ear.  She had sudden worsening of dizzness/room spinning with irrigation.    Pt continued to have dizziness and nausea w/ inability to keep balance for walking Sunday and Monday (past 2 days).  Is able to walk w/o fear of falling today, but still has persistent dizziness spells w/ unilateral left sided loss of hearing with low-pitched "buzzing" tinnitus.  She reports no tinnitus or hearing loss of right ear. - High caffeine utilization: Drinks 4 cups caffeinated coffee and several cups caffeinated Mountain Dew daily - History of allergic rhinitis w/o current use of antihistamines or flonase.  - Pt is already on thiazide diuretic chlorthalidone.  Review of ED chart shows an ED visit referral was made to Dr. Tami Ribas, but pt has not yet heard about any appointments.     Social History   Tobacco Use  . Smoking status: Former Smoker    Last attempt to quit: 04/19/1993    Years since quitting: 23.8  . Smokeless tobacco: Never Used  Substance Use Topics  . Alcohol use: No    Alcohol/week: 0.0 oz  . Drug use: No    Review of Systems Per HPI unless specifically indicated above     Objective:    BP (!) 144/78 (BP Location: Right Arm, Patient Position: Sitting, Cuff Size: Large)   Pulse 92   Temp 97.9 F (36.6 C) (Oral)   Ht 5\' 5"  (1.651 m)   Wt 228 lb (103.4 kg)   SpO2 96%   BMI 37.94 kg/m   Wt Readings from Last 3 Encounters:    02/28/17 228 lb (103.4 kg)  02/26/17 229 lb 4.8 oz (104 kg)  12/12/16 230 lb (104.3 kg)    Physical Exam  Constitutional: She is oriented to person, place, and time. She appears well-developed and well-nourished. No distress.  HENT:  Head: Normocephalic and atraumatic.  Right Ear: Hearing, tympanic membrane, external ear and ear canal normal.  Left Ear: Tympanic membrane, external ear and ear canal normal. No drainage. Decreased hearing is noted.  Nose: Mucosal edema present. Right sinus exhibits no maxillary sinus tenderness and no frontal sinus tenderness. Left sinus exhibits no maxillary sinus tenderness and no frontal sinus tenderness.  Mouth/Throat: Uvula is midline, oropharynx is clear and moist and mucous membranes are normal.  Neurological: She is alert and oriented to person, place, and time. She has normal strength. No cranial nerve deficit or sensory deficit. She displays a negative Romberg sign.  Dix Hallpike: bilaterally is positive for dizziness, but negative for nystagmus.  - all position changes are currently making dizziness worse.  Skin: Skin is warm and dry.  Psychiatric: She has a normal mood and affect. Her behavior is normal. Judgment and thought content normal.   Results for orders placed or performed during the hospital encounter of 02/26/17  CBC  Result Value Ref Range   WBC 12.1 (H) 3.6 - 11.0 K/uL   RBC 4.85 3.80 - 5.20  MIL/uL   Hemoglobin 14.2 12.0 - 16.0 g/dL   HCT 42.5 35.0 - 47.0 %   MCV 87.6 80.0 - 100.0 fL   MCH 29.4 26.0 - 34.0 pg   MCHC 33.5 32.0 - 36.0 g/dL   RDW 14.2 11.5 - 14.5 %   Platelets 278 150 - 440 K/uL  Basic metabolic panel  Result Value Ref Range   Sodium 137 135 - 145 mmol/L   Potassium 3.2 (L) 3.5 - 5.1 mmol/L   Chloride 94 (L) 101 - 111 mmol/L   CO2 30 22 - 32 mmol/L   Glucose, Bld 146 (H) 65 - 99 mg/dL   BUN 15 6 - 20 mg/dL   Creatinine, Ser 1.01 (H) 0.44 - 1.00 mg/dL   Calcium 10.2 8.9 - 10.3 mg/dL   GFR calc non Af Amer  52 (L) >60 mL/min   GFR calc Af Amer 60 (L) >60 mL/min   Anion gap 13 5 - 15  Troponin I  Result Value Ref Range   Troponin I <0.03 <0.03 ng/mL      Assessment & Plan:   Problem List Items Addressed This Visit    None    Visit Diagnoses    Meniere's disease (cochlear hydrops), left    -  Primary Pt w/ acute onset dizziness after initial left-sided tinnitus, aural fullness.  Pt w/ persistent hearing loss, tinnitus, dizziness is consistent w/ meniere's disease.  Possible is BPPV or other cause unless similar symptoms are repeated.  No prior episodes of these symptoms in pt history.  Plan: 1. Discussed possible triggers to avoid (caffeine, alcohol, allergies).   - Pt should reduce caffeine intake gradually to one cup per day instead of 8 or more.   - Pt should also regularly take allergy medication.  Resume flonase 2 sprays each nostril once daily. - Consider daily loratadine or cetirizine 10 mg once daily. 2. Continue current treatments - Meclizine 12.5 mg tid prn - ondansetron 4 mg q6hr prn - chlorthalidone 25 mg once daily for blood pressure 3. Consider referral to ENT.  Referral may have been initiated in ED as Dr. Tami Ribas.  Pt provide instruction to call clinic if hearing loss persists for another 3 days (by Friday 03/03/17).  Not currently requiring urgent referral, but will need to be seen by ENT if future episodes occur or if current symptoms do not resolve. 4. Followup prn 3-5 days.        Follow up plan: Return 3-5 days if symptoms worsen or fail to improve.   Cassell Smiles, DNP, AGPCNP-BC Adult Gerontology Primary Care Nurse Practitioner [facility] 02/28/2017, 5:09 PM

## 2017-02-28 NOTE — Patient Instructions (Signed)
Dot, Thank you for coming in to clinic today.  1. You do have Vertigo.  With the loss of hearing, I am concerned for Meniere's disease. Triggers for Meniere disease may include high salt intake, caffeine, alcohol, nicotine, stress, monosodium glutamate (MSG), and allergies. Avoidance for patients with identified triggers may alleviate or ameliorate symptoms   Call clinic Thursday or Friday if you have no return of your hearing.  - Continue meclizine for dizziness and ondansetron for your nausea. - START fluticasone nasal spray 2 sprays once daily for 2 weeks.  Can continue 4-6 weeks if needed.   How to Perform the Epley Maneuver The Epley maneuver is an exercise that relieves symptoms of vertigo. Vertigo is the feeling that you or your surroundings are moving when they are not. When you feel vertigo, you may feel like the room is spinning and have trouble walking. Dizziness is a little different than vertigo. When you are dizzy, you may feel unsteady or light-headed. You can do this maneuver at home whenever you have symptoms of vertigo. You can do it up to 3 times a day until your symptoms go away. Even though the Epley maneuver may relieve your vertigo for a few weeks, it is possible that your symptoms will return. This maneuver relieves vertigo, but it does not relieve dizziness. What are the risks? If it is done correctly, the Epley maneuver is considered safe. Sometimes it can lead to dizziness or nausea that goes away after a short time. If you develop other symptoms, such as changes in vision, weakness, or numbness, stop doing the maneuver and call your health care provider. How to perform the Epley maneuver 1. Sit on the edge of a bed or table with your back straight and your legs extended or hanging over the edge of the bed or table. 2. Turn your head halfway toward the affected ear or side. 3. Lie backward quickly with your head turned until you are lying flat on your back. You may want  to position a pillow under your shoulders. 4. Hold this position for 30 seconds. You may experience an attack of vertigo. This is normal. 5. Turn your head to the opposite direction until your unaffected ear is facing the floor. 6. Hold this position for 30 seconds. You may experience an attack of vertigo. This is normal. Hold this position until the vertigo stops. 7. Turn your whole body to the same side as your head. Hold for another 30 seconds. 8. Sit back up. You can repeat this exercise up to 3 times a day. Follow these instructions at home:  After doing the Epley maneuver, you can return to your normal activities.  Ask your health care provider if there is anything you should do at home to prevent vertigo. He or she may recommend that you: ? Keep your head raised (elevated) with two or more pillows while you sleep. ? Do not sleep on the side of your affected ear. ? Get up slowly from bed. ? Avoid sudden movements during the day. ? Avoid extreme head movement, like looking up or bending over. Contact a health care provider if:  Your vertigo gets worse.  You have other symptoms, including: ? Nausea. ? Vomiting. ? Headache. Get help right away if:  You have vision changes.  You have a severe or worsening headache or neck pain.  You cannot stop vomiting.  You have new numbness or weakness in any part of your body. Summary  Vertigo is the  feeling that you or your surroundings are moving when they are not.  The Epley maneuver is an exercise that relieves symptoms of vertigo.  If the Epley maneuver is done correctly, it is considered safe. You can do it up to 3 times a day. This information is not intended to replace advice given to you by your health care provider. Make sure you discuss any questions you have with your health care provider. Document Released: 04/09/2013 Document Revised: 02/23/2016 Document Reviewed: 02/23/2016 Elsevier Interactive Patient Education  2017  [employer]   Meniere Disease Meniere disease is an inner ear disorder. It causes attacks of a spinning sensation (vertigo), dizziness, and ringing in the ear (tinnitus). It also causes hearing loss and a feeling of fullness or pressure in the ear. This is a lifelong condition, and it may get worse over time. You may have drop attacks or severe dizziness that makes you fall. A drop attack is when you suddenly fall without losing consciousness and you quickly recover after a few seconds or minutes. What are the causes? This condition is caused by having too much of the fluid that is in your inner ear (endolymph). When fluid builds up in your inner ear, it affects the nerves that control balance and hearing. The reason for the fluid buildup is not known. Possible causes include:  Allergies.  An abnormal reaction of the body's defense system (autoimmune disease).  Viral infection of the inner ear.  Head injury.  What increases the risk? You are more likely to develop this condition if:  You are older than age 63.  You have a family history of Meniere disease.  You have a history of autoimmune disease.  You have a history of migraine headaches.  What are the signs or symptoms? Symptoms of this condition can come and go and may last for up to 4 hours at a time. Symptoms usually start in one ear. They may become more frequent and eventually involve both ears. Symptoms can include:  Fullness and pressure in your ear.  Roaring or ringing in your ear.  Vertigo and loss of balance.  Dizziness.  Decreased hearing.  Nausea and vomiting.  How is this diagnosed? This condition is diagnosed based on:  A physical exam.  Tests , such as: ? A hearing test (audiogram). ? An electronystagmogram. This tests your balance nerve (vestibular nerve). ? Imaging studies of your inner ear, such as CT scan or MRI. ? Other balance tests, such as rotational or balance platform tests.  How is  this treated? There is no cure for this condition, but treatment can help to manage your symptoms. Treatment may include:  A low-salt diet. Limiting salt may help to reduce fluid in the body and relieve symptoms.  Oral or injected medicines to reduce or control: ? Vertigo. ? Nausea. ? Fluid retention. ? Dizziness.  Use of an air pressure pulse generator. This is a machine that sends small pressure pulses into your ear canal.  Hearing aids.  Inner ear surgery. This is rare.  When you have symptoms, it can be helpful to lie down on a flat surface and focus your eyes on one object that does not move. Try to stay in that position until your symptoms go away. Follow these instructions at home: Eating and drinking  Eat the same amount of food at the same time every day, including snacks.  Do not skip meals.  Avoid caffeine.  Drink enough fluids to keep your urine clear  or pale yellow.  Limit alcoholic drinks to one drink a day for non-pregnant women and 2 drinks a day for men. One drink equals 12 oz of beer, 5 oz of wine, or 1 oz of hard liquor.  Limit the salt (sodium) in your diet as told by your health care provider. Check ingredients and nutrition facts on packaged foods and beverages.  Do not eat foods that contain monosodium glutamate (MSG). General instructions  Do not use any products that contain nicotine or tobacco, such as cigarettes and e-cigarettes. If you need help quitting, ask your health care provider.  Take over-the-counter and prescription medicines only as told by your health care provider.  Find ways to reduce or avoid stress. If you need help with this, ask your health care provider.  Do not drive if you have vertigo or dizziness. Contact a health care provider if:  You have symptoms that last longer than 4 hours.  You have new or worse symptoms. Get help right away if:  You have been vomiting for 24 hours.  You cannot keep fluids down.  You have  chest pain or trouble breathing. Summary  Meniere disease is an inner ear disorder. It causes attacks of a spinning sensation (vertigo), dizziness, and ringing in the ear (tinnitus). It also causes hearing loss and a feeling of fullness or pressure in the ear.  Symptoms of this condition can come and go and may last for up to 4 hours at a time.  When you have symptoms, it can be helpful to lie down on a flat surface and focus your eyes on one object that does not move. Try to stay in that position until your symptoms go away. This information is not intended to replace advice given to you by your health care provider. Make sure you discuss any questions you have with your health care provider. Document Released: 04/01/2000 Document Revised: 02/24/2016 Document Reviewed: 02/24/2016 Elsevier Interactive Patient Education  2017 [place].   Please schedule a follow-up appointment with Cassell Smiles, AGNP. Return 3-5 days if symptoms worsen or fail to improve.  If you have any other questions or concerns, please feel free to call the clinic or send a message through [place]. You may also schedule an earlier appointment if necessary.  You will receive a survey after today's visit either digitally by e-mail or paper by [employer]. Your experiences and feedback matter to [place].  Please respond so we know how we are doing as we provide care for you.   Cassell Smiles, DNP, AGNP-BC Adult Gerontology Nurse Practitioner [place]

## 2017-03-01 ENCOUNTER — Other Ambulatory Visit: Payer: Self-pay | Admitting: Hematology and Oncology

## 2017-03-03 ENCOUNTER — Telehealth: Payer: Self-pay | Admitting: Family Medicine

## 2017-03-03 DIAGNOSIS — H8102 Meniere's disease, left ear: Secondary | ICD-10-CM

## 2017-03-03 NOTE — Telephone Encounter (Signed)
We will send pt an ENT referral to [facility] for next week.

## 2017-03-03 NOTE — Telephone Encounter (Signed)
Pt.called states that she was still having problems with her hearing. Pt  Call back  # is [phone]

## 2017-03-07 DIAGNOSIS — J449 Chronic obstructive pulmonary disease, unspecified: Secondary | ICD-10-CM | POA: Diagnosis not present

## 2017-03-07 DIAGNOSIS — G4734 Idiopathic sleep related nonobstructive alveolar hypoventilation: Secondary | ICD-10-CM | POA: Diagnosis not present

## 2017-03-07 DIAGNOSIS — R0609 Other forms of dyspnea: Secondary | ICD-10-CM | POA: Diagnosis not present

## 2017-03-13 ENCOUNTER — Telehealth: Payer: Self-pay | Admitting: Family Medicine

## 2017-03-13 ENCOUNTER — Other Ambulatory Visit: Payer: Self-pay

## 2017-03-13 MED ORDER — MECLIZINE HCL 25 MG PO TABS: 12.5000 mg | ORAL_TABLET | Freq: Three times a day (TID) | ORAL | 0 refills | Status: DC | PRN

## 2017-03-13 NOTE — Telephone Encounter (Signed)
Pt. Called request refill on  Meclizine called into [facility]

## 2017-03-16 ENCOUNTER — Other Ambulatory Visit: Payer: Medicare Other

## 2017-03-16 DIAGNOSIS — E876 Hypokalemia: Secondary | ICD-10-CM | POA: Diagnosis not present

## 2017-03-16 DIAGNOSIS — R7303 Prediabetes: Secondary | ICD-10-CM | POA: Diagnosis not present

## 2017-03-16 DIAGNOSIS — N183 Chronic kidney disease, stage 3 unspecified: Secondary | ICD-10-CM

## 2017-03-16 DIAGNOSIS — I10 Essential (primary) hypertension: Secondary | ICD-10-CM | POA: Diagnosis not present

## 2017-03-17 LAB — BASIC METABOLIC PANEL WITH GFR
BUN: 18 mg/dL (ref 7–25)
CALCIUM: 9.6 mg/dL (ref 8.6–10.4)
CO2: 29 mmol/L (ref 20–32)
CREATININE: 0.86 mg/dL (ref 0.60–0.93)
Chloride: 98 mmol/L (ref 98–110)
GFR, EST NON AFRICAN AMERICAN: 64 mL/min/{1.73_m2} (ref >=60)
GFR, Est African American: 74 mL/min/{1.73_m2} (ref >=60)
GLUCOSE: 79 mg/dL (ref 65–139)
POTASSIUM: 3.7 mmol/L (ref 3.5–5.3)
Sodium: 139 mmol/L (ref 135–146)

## 2017-03-17 LAB — HEMOGLOBIN A1C
EAG (MMOL/L): 6.6 (calc)
HEMOGLOBIN A1C: 5.8 %{Hb} — AB (ref <5.7)
MEAN PLASMA GLUCOSE: 120 (calc)

## 2017-03-20 ENCOUNTER — Ambulatory Visit (INDEPENDENT_AMBULATORY_CARE_PROVIDER_SITE_OTHER): Payer: Medicare Other | Admitting: Family Medicine

## 2017-03-20 ENCOUNTER — Encounter: Payer: Self-pay | Admitting: Family Medicine

## 2017-03-20 VITALS — BP 133/71 | HR 100 | Temp 98.3°F | Ht 65.5 in | Wt 228.0 lb

## 2017-03-20 DIAGNOSIS — N183 Chronic kidney disease, stage 3 unspecified: Secondary | ICD-10-CM

## 2017-03-20 DIAGNOSIS — R7303 Prediabetes: Secondary | ICD-10-CM | POA: Diagnosis not present

## 2017-03-20 DIAGNOSIS — H8102 Meniere's disease, left ear: Secondary | ICD-10-CM | POA: Diagnosis not present

## 2017-03-20 DIAGNOSIS — H6122 Impacted cerumen, left ear: Secondary | ICD-10-CM | POA: Diagnosis not present

## 2017-03-20 DIAGNOSIS — H8190 Unspecified disorder of vestibular function, unspecified ear: Secondary | ICD-10-CM | POA: Insufficient documentation

## 2017-03-20 NOTE — Patient Instructions (Addendum)
Thank you for coming to the clinic today.  1.  Recommend to start taking Tylenol / Acetaminophen 650mg  tabs - take 2 tabs per dose (max 1300mg  per dose) 3 times a day, or every 6-8 hours, EVERY DAY  You can still take the Aspirin as discussed take it as needed prefer LOWER dose 1 x daily or less often only as needed  Do not take Aleve  Keep taking Chlrothalidone  2. You have symptoms of Vertigo (Benign Paroxysmal Positional Vertigo) Also with Meniere's Disease that we are concerned about and referred you to ENT  - This is commonly caused by inner ear fluid imbalance, sometimes can be worsened by allergies and sinus symptoms, otherwise it can occur randomly sometimes and we may never discover the exact cause. - To treat this, try the Epley Manuever (see diagrams/instructions below) at home up to 3 times a day for 1-2 weeks or until symptoms resolve - You may take Meclizine as needed up to 3 times a day for dizziness, this will not cure symptoms but may help. Caution may make you drowsy.  If you develop significant worsening episode with vertigo that does not improve and you get severe headache, loss of vision, arm or leg weakness, slurred speech, or other concerning symptoms please seek immediate medical attention at Emergency Department.  See the next page for images describing the Epley Manuever.     ----------------------------------------------------------------------------------------------------------------------    Please schedule a Follow-up Appointment to: Return in about 3 months (around 06/18/2017) for PreDM A1c, HTN, Meniere's Vertigo update ENT.  If you have any other questions or concerns, please feel free to call the clinic or send a message through [place]. You may also schedule an earlier appointment if necessary.  Additionally, you may be receiving a survey about your experience at our clinic within a few days to 1 week by e-mail or mail. We value your  feedback.  Nobie Putnam, DO [place]

## 2017-03-20 NOTE — Progress Notes (Signed)
Subjective:    Patient ID: Candace Tapia, female    DOB: 1935-09-21, 81 y.o.   MRN: [id number]  Candace Tapia is a 81 y.o. female presenting on 03/20/2017 for Hypertension and Dizziness (ringing in Left ear)  History provided by patient, and additional history per family member, Edd Fabian.  HPI   Possible Meniere's Disease vs Hearing Loss Left - Last visit with me 11/2016 for Annual Physical had been doing well overall, and repeat visit 02/28/17 with Cassell Smiles, AGPCNP-BC for hearing loss after ED visit for Vertigo, had constellation of hearing loss, L sided with buzzing, tinnitus, vertigo, headache, already on Thiazide with chlorthalidone, treated with referral to [facility], see prior notes for background information. - Interval update with ENT apt still pending scheduled for 03/27/17 - Today patient reports still has dizziness with vertigo on certain movements, some improved temporarily, limited relief on Meclizine asking about other meds, did not adhere to Epley maneuver at home, but will try again - Still has hearing loss L ear and additional sounds with ringing, "buzzing" and "voices" but unable to describe the voices, does seem more like abnormal sounds - Denies ear pain, URI sinus congestion pressure or drainage\  Pre-Diabetes Reports no concerns. Last A1c lab 02/2017 with 5.8, stable Meds: Never on meds Denies hypoglycemia, polyuria, visual changes, numbness or tingling.  Osteoarthritis, Multiple joints (hands, knees, hips, back) - History of OA/DJD, in past followed by Pain Management required Hydrocodone PRN - Currently still reports same complaints, no significant change, worse some days others improved, describes stiffness with prolonged sitting, sometimes difficult to walk - She has switched to taking Aspirin back and body 200mg  up to 4 times daily, stopped Tylenol and rarely taking NSAID - Denies fall injury trauma, joint swelling or redness  CKD-III - Reports  unsure of prior CKD history. Last labs done on 03/16/17 with Cr 0.86 and GFR 64, somewhat improved - Chronic history of NSAID use with OA DJD, now off since our last visit, but switched to ASA - Denies reduced urine output  Depression screen [facility] 2/9 12/06/2016 08/12/2016 02/08/2016  Decreased Interest 0 1 0  Down, Depressed, Hopeless 0 1 0  PHQ - 2 Score 0 2 0  Altered sleeping - 0 -  Tired, decreased energy - 2 -  Change in appetite - 0 -  Feeling bad or failure about yourself  - 0 -  Trouble concentrating - 3 -  Moving slowly or fidgety/restless - 0 -  Suicidal thoughts - 0 -  PHQ-9 Score - 7 -  Difficult doing work/chores - Not difficult at all -    Social History   Tobacco Use  . Smoking status: Former Smoker    Last attempt to quit: 04/19/1993    Years since quitting: 23.9  . Smokeless tobacco: Never Used  Substance Use Topics  . Alcohol use: No    Alcohol/week: 0.0 oz  . Drug use: No    Review of Systems Per HPI unless specifically indicated above     Objective:    BP 133/71   Pulse 100   Temp 98.3 F (36.8 C) (Oral)   Ht 5' 5.5" (1.664 m)   Wt 228 lb (103.4 kg)   HC 16" (40.6 cm)   BMI 37.36 kg/m   Wt Readings from Last 3 Encounters:  03/20/17 228 lb (103.4 kg)  02/28/17 228 lb (103.4 kg)  02/26/17 229 lb 4.8 oz (104 kg)    Physical Exam  Constitutional: She  is oriented to person, place, and time. She appears well-developed and well-nourished. No distress.  Well-appearing, comfortable, cooperative  HENT:  Head: Normocephalic and atraumatic.  Mouth/Throat: Oropharynx is clear and moist.  Frontal / maxillary sinuses non-tender. Nares patent without purulence or edema.  R TM with normal appearance, ear canal normal, no cerumen. No effusion or erythema.  L ear canal blocked by obscuring impacted dry cerumen. See repeat exam s/p removal.  Oropharynx clear without erythema, exudates, edema or asymmetry.  Did not repeat vertigo testing today, was positive  last visit.  Eyes: Conjunctivae are normal. Right eye exhibits no discharge. Left eye exhibits no discharge.  Neck: Normal range of motion. Neck supple.  Cardiovascular: Normal rate, regular rhythm, normal heart sounds and intact distal pulses.  No murmur heard. Pulmonary/Chest: Effort normal and breath sounds normal. No respiratory distress. She has no wheezes. She has no rales.  Musculoskeletal: Normal range of motion. She exhibits no edema.  Lymphadenopathy:    She has no cervical adenopathy.  Neurological: She is alert and oriented to person, place, and time.  Skin: Skin is warm and dry. No rash noted. She is not diaphoretic. No erythema.  Psychiatric: She has a normal mood and affect. Her behavior is normal.  Well groomed, good eye contact, normal speech and thoughts  Nursing note and vitals reviewed.   ________________________________________________________ PROCEDURE NOTE Date: 03/20/17 Left Ear Lavage / Cerumen Removal Discussed benefits and risks (including pain / discomforts, dizziness, minor abrasion of ear canal). Verbal consent given by patient. Medication:  carbamide peroxide ear drops, Ear Lavage Solution (warm water + hydrogen peroxide) Performed by both Frederich Cha, CMA with flushing and additional flushing and curette removal of earwax done by Dr Parks Ranger Several drops of carbamide peroxide placed in each ear, allowed to sit for few minutes. Ear lavage solution flushed into Left ear in attempt to dislodge and remove ear wax. Results were good with removal of large hard impacted piece of cerumen, however still softer debris and wax against TM L ear.   Repeat Ear Exam: - Left TM now partially visible. R TM normal. Significant softening of ear wax with some removal but still deeper thick impacted cerumen. Attempted curette removal, only partially removed limited results.    Results for orders placed or performed in visit on 55/73/22  BASIC METABOLIC PANEL WITH GFR    Result Value Ref Range   Glucose, Bld 79 65 - 139 mg/dL   BUN 18 7 - 25 mg/dL   Creat 0.86 0.60 - 0.93 mg/dL   GFR, Est Non African American 64 > OR = 60 mL/min/1.43m2   GFR, Est African American 74 > OR = 60 mL/min/1.21m2   BUN/Creatinine Ratio NOT APPLICABLE 6 - 22 (calc)   Sodium 139 135 - 146 mmol/L   Potassium 3.7 3.5 - 5.3 mmol/L   Chloride 98 98 - 110 mmol/L   CO2 29 20 - 32 mmol/L   Calcium 9.6 8.6 - 10.4 mg/dL  Hemoglobin A1c  Result Value Ref Range   Hgb A1c MFr Bld 5.8 (H) <5.7 % of total Hgb   Mean Plasma Glucose 120 (calc)   eAG (mmol/L) 6.6 (calc)      Assessment & Plan:   Problem List Items Addressed This Visit    CKD (chronic kidney disease), stage III ([place])    Stable to improved CKD-II to III with Cr / GFR on last lab 02/2017 Remains off NSAID but on ASA high dose regularly for pain Improved HTN  Recommend switch to regular Tylenol, limit ASA, avoid NSAID      Meniere's disease - Primary    Suspected L sided Meniere's disease with prior history more acute hearing loss L headache and dizziness vertigo, seems refractory to BPPV therapy - However now today some question of hearing loss more related to cerumen impaction, partial improvement today still some reduced hearing, incomplete removal  Plan: 1. Agree with referral to ENT - scheduled in 1 week on 03/27/17 [place] [facility] will benefit from more formal hearing eval, likely better cerumen removal and eval of TM, consider vestibular rehab in future as well if vertigo not improved - Already on thiazide so should be treated for possible Meniere's already      Pre-diabetes    Well-controlled Pre-DM with A1c 5.8 (stable from previous Concern with obesity, HTN, HLD  Plan:  1. Not on any therapy currently  2. Encourage improved lifestyle - low carb, low sugar diet, reduce portion size, continue improving regular exercise 3. Follow-up 3 months A1c       Other Visit Diagnoses    Impacted cerumen of left ear     - Removed main hard dry impacting cerumen, some soft cerumen still present, after flush and curette removal - Follow-up with ENT, seems improved hearing      No orders of the defined types were placed in this encounter.  Follow up plan: Return in about 3 months (around 06/18/2017) for PreDM A1c, HTN, Meniere's Vertigo update ENT.  Nobie Putnam, [facility] 03/21/2017, 12:42 AM

## 2017-03-21 NOTE — Assessment & Plan Note (Signed)
Suspected L sided Meniere's disease with prior history more acute hearing loss L headache and dizziness vertigo, seems refractory to BPPV therapy - However now today some question of hearing loss more related to cerumen impaction, partial improvement today still some reduced hearing, incomplete removal  Plan: 1. Agree with referral to ENT - scheduled in 1 week on 03/27/17 [place] [facility] will benefit from more formal hearing eval, likely better cerumen removal and eval of TM, consider vestibular rehab in future as well if vertigo not improved - Already on thiazide so should be treated for possible Meniere's already

## 2017-03-21 NOTE — Assessment & Plan Note (Signed)
Stable to improved CKD-II to III with Cr / GFR on last lab 02/2017 Remains off NSAID but on ASA high dose regularly for pain Improved HTN Recommend switch to regular Tylenol, limit ASA, avoid NSAID

## 2017-03-21 NOTE — Assessment & Plan Note (Signed)
Well-controlled Pre-DM with A1c 5.8 (stable from previous Concern with obesity, HTN, HLD  Plan:  1. Not on any therapy currently  2. Encourage improved lifestyle - low carb, low sugar diet, reduce portion size, continue improving regular exercise 3. Follow-up 3 months A1c

## 2017-03-22 DIAGNOSIS — J449 Chronic obstructive pulmonary disease, unspecified: Secondary | ICD-10-CM | POA: Diagnosis not present

## 2017-03-29 ENCOUNTER — Telehealth: Payer: Self-pay | Admitting: Nurse Practitioner

## 2017-03-29 MED ORDER — MECLIZINE HCL 25 MG PO TABS: 12.5000 mg | ORAL_TABLET | Freq: Three times a day (TID) | ORAL | 0 refills | Status: DC | PRN

## 2017-03-29 NOTE — Telephone Encounter (Signed)
Pt had to reschedule ENT appt for 04/19/17 due to weather.  She is out of meclizine and for a refill until she can get to appt.  She uses [facility] and her call back number is [phone]

## 2017-04-19 DIAGNOSIS — H90A22 Sensorineural hearing loss, unilateral, left ear, with restricted hearing on the contralateral side: Secondary | ICD-10-CM | POA: Diagnosis not present

## 2017-04-19 DIAGNOSIS — H6122 Impacted cerumen, left ear: Secondary | ICD-10-CM | POA: Diagnosis not present

## 2017-04-19 DIAGNOSIS — H9120 Sudden idiopathic hearing loss, unspecified ear: Secondary | ICD-10-CM | POA: Diagnosis not present

## 2017-04-19 DIAGNOSIS — R42 Dizziness and giddiness: Secondary | ICD-10-CM | POA: Diagnosis not present

## 2017-04-22 DIAGNOSIS — J449 Chronic obstructive pulmonary disease, unspecified: Secondary | ICD-10-CM | POA: Diagnosis not present

## 2017-04-26 DIAGNOSIS — R42 Dizziness and giddiness: Secondary | ICD-10-CM | POA: Diagnosis not present

## 2017-05-01 ENCOUNTER — Other Ambulatory Visit: Payer: Medicare Other

## 2017-05-01 ENCOUNTER — Ambulatory Visit: Payer: Medicare Other | Admitting: Hematology and Oncology

## 2017-05-01 DIAGNOSIS — R42 Dizziness and giddiness: Secondary | ICD-10-CM | POA: Diagnosis not present

## 2017-05-10 DIAGNOSIS — R42 Dizziness and giddiness: Secondary | ICD-10-CM | POA: Diagnosis not present

## 2017-05-10 DIAGNOSIS — H903 Sensorineural hearing loss, bilateral: Secondary | ICD-10-CM | POA: Diagnosis not present

## 2017-05-13 ENCOUNTER — Observation Stay
Admission: EM | Admit: 2017-05-13 | Discharge: 2017-05-15 | Disposition: A | Discharge status: Home / Self Care | Payer: Medicare Other | Attending: Internal Medicine | Admitting: Internal Medicine

## 2017-05-13 ENCOUNTER — Other Ambulatory Visit: Payer: Self-pay

## 2017-05-13 ENCOUNTER — Emergency Department: Payer: Medicare Other

## 2017-05-13 DIAGNOSIS — J9621 Acute and chronic respiratory failure with hypoxia: Secondary | ICD-10-CM | POA: Diagnosis not present

## 2017-05-13 DIAGNOSIS — N183 Chronic kidney disease, stage 3 (moderate): Secondary | ICD-10-CM | POA: Insufficient documentation

## 2017-05-13 DIAGNOSIS — J440 Chronic obstructive pulmonary disease with acute lower respiratory infection: Secondary | ICD-10-CM | POA: Diagnosis not present

## 2017-05-13 DIAGNOSIS — Z853 Personal history of malignant neoplasm of breast: Secondary | ICD-10-CM | POA: Diagnosis not present

## 2017-05-13 DIAGNOSIS — Z66 Do not resuscitate: Secondary | ICD-10-CM | POA: Diagnosis not present

## 2017-05-13 DIAGNOSIS — J9601 Acute respiratory failure with hypoxia: Secondary | ICD-10-CM

## 2017-05-13 DIAGNOSIS — Z880 Allergy status to penicillin: Secondary | ICD-10-CM | POA: Insufficient documentation

## 2017-05-13 DIAGNOSIS — R079 Chest pain, unspecified: Secondary | ICD-10-CM | POA: Diagnosis not present

## 2017-05-13 DIAGNOSIS — N390 Urinary tract infection, site not specified: Secondary | ICD-10-CM | POA: Diagnosis not present

## 2017-05-13 DIAGNOSIS — I129 Hypertensive chronic kidney disease with stage 1 through stage 4 chronic kidney disease, or unspecified chronic kidney disease: Secondary | ICD-10-CM | POA: Insufficient documentation

## 2017-05-13 DIAGNOSIS — Z923 Personal history of irradiation: Secondary | ICD-10-CM | POA: Insufficient documentation

## 2017-05-13 DIAGNOSIS — E876 Hypokalemia: Secondary | ICD-10-CM | POA: Insufficient documentation

## 2017-05-13 DIAGNOSIS — Z888 Allergy status to other drugs, medicaments and biological substances status: Secondary | ICD-10-CM | POA: Diagnosis not present

## 2017-05-13 DIAGNOSIS — K219 Gastro-esophageal reflux disease without esophagitis: Secondary | ICD-10-CM | POA: Insufficient documentation

## 2017-05-13 DIAGNOSIS — Z9221 Personal history of antineoplastic chemotherapy: Secondary | ICD-10-CM | POA: Insufficient documentation

## 2017-05-13 DIAGNOSIS — R0602 Shortness of breath: Secondary | ICD-10-CM | POA: Diagnosis not present

## 2017-05-13 DIAGNOSIS — Z79899 Other long term (current) drug therapy: Secondary | ICD-10-CM | POA: Diagnosis not present

## 2017-05-13 DIAGNOSIS — I10 Essential (primary) hypertension: Secondary | ICD-10-CM | POA: Diagnosis not present

## 2017-05-13 DIAGNOSIS — J441 Chronic obstructive pulmonary disease with (acute) exacerbation: Secondary | ICD-10-CM | POA: Diagnosis not present

## 2017-05-13 DIAGNOSIS — J189 Pneumonia, unspecified organism: Secondary | ICD-10-CM | POA: Insufficient documentation

## 2017-05-13 DIAGNOSIS — B9689 Other specified bacterial agents as the cause of diseases classified elsewhere: Secondary | ICD-10-CM | POA: Diagnosis not present

## 2017-05-13 DIAGNOSIS — Z7982 Long term (current) use of aspirin: Secondary | ICD-10-CM | POA: Insufficient documentation

## 2017-05-13 DIAGNOSIS — Z90710 Acquired absence of both cervix and uterus: Secondary | ICD-10-CM | POA: Insufficient documentation

## 2017-05-13 DIAGNOSIS — M1610 Unilateral primary osteoarthritis, unspecified hip: Secondary | ICD-10-CM | POA: Diagnosis not present

## 2017-05-13 DIAGNOSIS — Z882 Allergy status to sulfonamides status: Secondary | ICD-10-CM | POA: Insufficient documentation

## 2017-05-13 DIAGNOSIS — R0789 Other chest pain: Secondary | ICD-10-CM | POA: Diagnosis not present

## 2017-05-13 LAB — URINALYSIS, ROUTINE W REFLEX MICROSCOPIC
BILIRUBIN URINE: NEGATIVE
GLUCOSE, UA: NEGATIVE mg/dL
Ketones, ur: NEGATIVE mg/dL
NITRITE: NEGATIVE
Protein, ur: NEGATIVE mg/dL
pH: 6 (ref 5.0–8.0)

## 2017-05-13 LAB — CBC WITH DIFFERENTIAL/PLATELET
BASOS PCT: 1 %
Basophils Absolute: 0.1 10*3/uL (ref 0–0.1)
EOS ABS: 0.2 10*3/uL (ref 0–0.7)
EOS PCT: 2 %
HCT: 40.6 % (ref 35.0–47.0)
Hemoglobin: 13.6 g/dL (ref 12.0–16.0)
LYMPHS ABS: 0.8 10*3/uL — AB (ref 1.0–3.6)
Lymphocytes Relative: 9 %
MCH: 29.8 pg (ref 26.0–34.0)
MCHC: 33.5 g/dL (ref 32.0–36.0)
MCV: 88.8 fL (ref 80.0–100.0)
MONO ABS: 0.4 10*3/uL (ref 0.2–0.9)
MONOS PCT: 5 %
Neutro Abs: 7.7 10*3/uL — ABNORMAL HIGH (ref 1.4–6.5)
Neutrophils Relative %: 83 %
Platelets: 206 10*3/uL (ref 150–440)
RBC: 4.57 MIL/uL (ref 3.80–5.20)
RDW: 14.4 % (ref 11.5–14.5)
WBC: 9.2 10*3/uL (ref 3.6–11.0)

## 2017-05-13 LAB — COMPREHENSIVE METABOLIC PANEL
ALBUMIN: 3.6 g/dL (ref 3.5–5.0)
ALT: 19 U/L (ref 14–54)
AST: 25 U/L (ref 15–41)
Alkaline Phosphatase: 76 U/L (ref 38–126)
Anion gap: 13 (ref 5–15)
BUN: 14 mg/dL (ref 6–20)
CALCIUM: 9.4 mg/dL (ref 8.9–10.3)
CO2: 26 mmol/L (ref 22–32)
Chloride: 100 mmol/L — ABNORMAL LOW (ref 101–111)
Creatinine, Ser: 0.75 mg/dL (ref 0.44–1.00)
GFR calc non Af Amer: >60 mL/min (ref >60)
GLUCOSE: 154 mg/dL — AB (ref 65–99)
POTASSIUM: 3.1 mmol/L — AB (ref 3.5–5.1)
SODIUM: 139 mmol/L (ref 135–145)
TOTAL PROTEIN: 7.4 g/dL (ref 6.5–8.1)
Total Bilirubin: 0.9 mg/dL (ref 0.3–1.2)

## 2017-05-13 LAB — TROPONIN I: Troponin I: <0.03 ng/mL (ref <0.03)

## 2017-05-13 LAB — MAGNESIUM: Magnesium: 1.7 mg/dL (ref 1.7–2.4)

## 2017-05-13 MED ORDER — ADULT MULTIVITAMIN W/MINERALS CH
1.0000 | ORAL_TABLET | Freq: Every day | ORAL | Status: DC
  Administered 2017-05-13 – 2017-05-14 (×2): 1 via ORAL
  Filled 2017-05-13 (×3): qty 1

## 2017-05-13 MED ORDER — ENOXAPARIN SODIUM 40 MG/0.4ML ~~LOC~~ SOLN
40.0000 mg | SUBCUTANEOUS | Status: DC
  Filled 2017-05-13: qty 0.4

## 2017-05-13 MED ORDER — IPRATROPIUM-ALBUTEROL 0.5-2.5 (3) MG/3ML IN SOLN
3.0000 mL | Freq: Once | RESPIRATORY_TRACT | Status: AC
  Administered 2017-05-13: 3 mL via RESPIRATORY_TRACT
  Filled 2017-05-13: qty 6

## 2017-05-13 MED ORDER — LOSARTAN POTASSIUM 50 MG PO TABS
100.0000 mg | ORAL_TABLET | Freq: Every day | ORAL | Status: DC
  Administered 2017-05-13 – 2017-05-15 (×3): 100 mg via ORAL
  Filled 2017-05-13 (×3): qty 2

## 2017-05-13 MED ORDER — ACETAMINOPHEN 325 MG PO TABS: 650.0000 mg | ORAL_TABLET | Freq: Four times a day (QID) | ORAL | Status: DC | PRN

## 2017-05-13 MED ORDER — HYDROCODONE-ACETAMINOPHEN 5-325 MG PO TABS
1.0000 | ORAL_TABLET | Freq: Once | ORAL | Status: AC
  Administered 2017-05-13: 1 via ORAL
  Filled 2017-05-13: qty 1

## 2017-05-13 MED ORDER — LORATADINE 10 MG PO TABS
10.0000 mg | ORAL_TABLET | Freq: Every day | ORAL | Status: DC
  Administered 2017-05-15: 10 mg via ORAL
  Filled 2017-05-13 (×2): qty 1

## 2017-05-13 MED ORDER — ACETAMINOPHEN 650 MG RE SUPP: 650.0000 mg | Freq: Four times a day (QID) | RECTAL | Status: DC | PRN

## 2017-05-13 MED ORDER — ONDANSETRON HCL 4 MG PO TABS: 4.0000 mg | ORAL_TABLET | Freq: Four times a day (QID) | ORAL | Status: DC | PRN

## 2017-05-13 MED ORDER — MECLIZINE HCL 12.5 MG PO TABS
12.5000 mg | ORAL_TABLET | Freq: Three times a day (TID) | ORAL | Status: DC | PRN
  Filled 2017-05-13: qty 1

## 2017-05-13 MED ORDER — CHLORTHALIDONE 25 MG PO TABS
25.0000 mg | ORAL_TABLET | Freq: Every day | ORAL | Status: DC
Start: 2017-05-13 — End: 2017-05-15
  Administered 2017-05-13 – 2017-05-15 (×3): 25 mg via ORAL
  Filled 2017-05-13 (×3): qty 1

## 2017-05-13 MED ORDER — PANTOPRAZOLE SODIUM 40 MG PO TBEC
40.0000 mg | DELAYED_RELEASE_TABLET | Freq: Every day | ORAL | Status: DC
  Administered 2017-05-14 – 2017-05-15 (×2): 40 mg via ORAL
  Filled 2017-05-13 (×3): qty 1

## 2017-05-13 MED ORDER — SODIUM CHLORIDE 0.9 % IV BOLUS (SEPSIS)
500.0000 mL | Freq: Once | INTRAVENOUS | Status: AC
Start: 2017-05-13 — End: 2017-05-13
  Administered 2017-05-13: 500 mL via INTRAVENOUS

## 2017-05-13 MED ORDER — LEVOFLOXACIN 500 MG PO TABS
750.0000 mg | ORAL_TABLET | Freq: Every day | ORAL | Status: DC
  Administered 2017-05-14 – 2017-05-15 (×2): 750 mg via ORAL
  Filled 2017-05-13 (×2): qty 2

## 2017-05-13 MED ORDER — LEVOFLOXACIN IN D5W 500 MG/100ML IV SOLN
500.0000 mg | Freq: Once | INTRAVENOUS | Status: AC
  Administered 2017-05-13: 500 mg via INTRAVENOUS
  Filled 2017-05-13: qty 100

## 2017-05-13 MED ORDER — IOPAMIDOL (ISOVUE-370) INJECTION 76%
75.0000 mL | Freq: Once | INTRAVENOUS | Status: AC | PRN
  Administered 2017-05-13: 75 mL via INTRAVENOUS

## 2017-05-13 MED ORDER — BUDESONIDE 0.5 MG/2ML IN SUSP
0.5000 mg | Freq: Two times a day (BID) | RESPIRATORY_TRACT | Status: DC
  Administered 2017-05-13 – 2017-05-15 (×5): 0.5 mg via RESPIRATORY_TRACT
  Filled 2017-05-13 (×5): qty 2

## 2017-05-13 MED ORDER — ONDANSETRON HCL 4 MG/2ML IJ SOLN: 4.0000 mg | Freq: Four times a day (QID) | INTRAMUSCULAR | Status: DC | PRN

## 2017-05-13 MED ORDER — POTASSIUM CHLORIDE CRYS ER 20 MEQ PO TBCR
20.0000 meq | EXTENDED_RELEASE_TABLET | Freq: Two times a day (BID) | ORAL | Status: AC
  Administered 2017-05-13 – 2017-05-14 (×4): 20 meq via ORAL
  Filled 2017-05-13 (×4): qty 1

## 2017-05-13 MED ORDER — METHYLPREDNISOLONE SODIUM SUCC 125 MG IJ SOLR
125.0000 mg | Freq: Once | INTRAMUSCULAR | Status: AC
  Administered 2017-05-13: 125 mg via INTRAVENOUS
  Filled 2017-05-13: qty 2

## 2017-05-13 MED ORDER — METHYLPREDNISOLONE SODIUM SUCC 40 MG IJ SOLR
40.0000 mg | Freq: Two times a day (BID) | INTRAMUSCULAR | Status: DC
  Administered 2017-05-13 – 2017-05-15 (×4): 40 mg via INTRAVENOUS
  Filled 2017-05-13 (×4): qty 1

## 2017-05-13 MED ORDER — IPRATROPIUM-ALBUTEROL 0.5-2.5 (3) MG/3ML IN SOLN
3.0000 mL | Freq: Once | RESPIRATORY_TRACT | Status: AC
  Administered 2017-05-13: 3 mL via RESPIRATORY_TRACT
  Filled 2017-05-13: qty 3

## 2017-05-13 MED ORDER — AMLODIPINE BESYLATE 5 MG PO TABS
5.0000 mg | ORAL_TABLET | Freq: Every day | ORAL | Status: DC
  Administered 2017-05-13 – 2017-05-15 (×3): 5 mg via ORAL
  Filled 2017-05-13 (×3): qty 1

## 2017-05-13 MED ORDER — IPRATROPIUM-ALBUTEROL 0.5-2.5 (3) MG/3ML IN SOLN
3.0000 mL | Freq: Once | RESPIRATORY_TRACT | Status: AC
  Administered 2017-05-13: 3 mL via RESPIRATORY_TRACT

## 2017-05-13 MED ORDER — IPRATROPIUM-ALBUTEROL 0.5-2.5 (3) MG/3ML IN SOLN
3.0000 mL | Freq: Four times a day (QID) | RESPIRATORY_TRACT | Status: DC
Start: 2017-05-13 — End: 2017-05-14
  Administered 2017-05-13 (×2): 3 mL via RESPIRATORY_TRACT
  Filled 2017-05-13 (×2): qty 3

## 2017-05-13 NOTE — H&P (Signed)
[facility] [place] NAME: Candace Tapia    MR#:  [id number]  DATE OF BIRTH:  [birth year]  DATE OF ADMISSION:  05/13/2017  PRIMARY CARE PHYSICIAN: Olin Hauser, DO   REQUESTING/REFERRING PHYSICIAN: Dr. Merlyn Lot  CHIEF COMPLAINT:  Right-sided pleuritic chest pain  HISTORY OF PRESENT ILLNESS:  Candace Tapia  is a 82 y.o. female with a known history of COPD, essential hypertension, history of breast cancer status post chemoradiation, GERD, osteoarthritis who presents to the hospital due to right sided pleuritic chest pain and shortness of breath. Patient says that she developed some right-sided pleuritic chest pain last night after she went to use the bathroom. It persisted throughout the night and therefore she was concerned and came to the ER for further evaluation. Patient underwent a CT of the chest which was negative for pulmonary embolism but was concerning for possible right-sided pneumonia. Patient was also hypoxic on ambulation in the ER and noted to be in acute on chronic respiratory failure with hypoxia and hospitalist services were collected for treatment and evaluation. Patient says that she has baseline exertional shortness of breath but it has gotten progressively worse over the past few months. She admits to a cough which is productive with clear sputum but no.related fevers, chills, nausea, vomiting or any other associated symptoms presently.  PAST MEDICAL HISTORY:   Past Medical History:  Diagnosis Date  . Allergy   . Arthritis   . Breast cancer ([place]) 2014   radiation and taking exemestane  . Cancer of left breast ([facility]) 10/27/2014  . COPD (chronic obstructive pulmonary disease) ([facility])   . GERD (gastroesophageal reflux disease)   . H/O: hysterectomy   . History of lumpectomy 2014   LT breast  . Hypertension     PAST SURGICAL HISTORY:   Past Surgical History:  Procedure Laterality Date  . ABDOMINAL  HYSTERECTOMY    . BREAST LUMPECTOMY Left 2014  . BREAST SURGERY Left 2014  . JOINT REPLACEMENT    . LYMPH NODE DISSECTION     neck  . TOTAL SHOULDER REPLACEMENT Bilateral 2010 and 2013    SOCIAL HISTORY:   Social History   Tobacco Use  . Smoking status: Former Smoker    Packs/day: 2.00    Years: 40.00    Pack years: 80.00    Last attempt to quit: 04/19/1993    Years since quitting: 24.0  . Smokeless tobacco: Never Used  Substance Use Topics  . Alcohol use: No    Alcohol/week: 0.0 oz    FAMILY HISTORY:   Family History  Problem Relation Age of Onset  . Breast cancer Mother 13  . Diabetes Mother   . Kidney disease Father   . Diabetes Father   . Breast cancer Sister 50    DRUG ALLERGIES:   Allergies  Allergen Reactions  . Tramadol Hcl Itching    Per pt it caused me to itch all over  . Statins     Not sure what exact med was; just made her feel bad (can't elaborate)  . Penicillin G Rash  . Penicillins Rash    Has patient had a PCN reaction causing immediate rash, facial/tongue/throat swelling, SOB or lightheadedness with hypotension: No Has patient had a PCN reaction causing severe rash involving mucus membranes or skin necrosis: No Has patient had a PCN reaction that required hospitalization: No Has patient had a PCN reaction occurring within the last 10 years: No If all of  the above answers are "NO", then may proceed with Cephalosporin use.  . Sulfa Antibiotics Other (See Comments) and Rash    REVIEW OF SYSTEMS:   Review of Systems  Constitutional: Negative for fever and weight loss.  HENT: Negative for congestion, nosebleeds and tinnitus.   Eyes: Negative for blurred vision, double vision and redness.  Respiratory: Positive for cough and shortness of breath. Negative for hemoptysis.   Cardiovascular: Positive for chest pain (Pleuritic). Negative for orthopnea, leg swelling and PND.  Gastrointestinal: Negative for abdominal pain, diarrhea, melena, nausea and  vomiting.  Genitourinary: Negative for dysuria, hematuria and urgency.  Musculoskeletal: Negative for falls and joint pain.  Neurological: Negative for dizziness, tingling, sensory change, focal weakness, seizures, weakness and headaches.  Endo/Heme/Allergies: Negative for polydipsia. Does not bruise/bleed easily.  Psychiatric/Behavioral: Negative for depression and memory loss. The patient is not nervous/anxious.     MEDICATIONS AT HOME:   Prior to Admission medications   Medication Sig Start Date End Date Taking? Authorizing Provider  acetaminophen (TYLENOL) 650 MG CR tablet Take 650 mg by mouth every 8 (eight) hours as needed for pain.   Yes [provider]  albuterol (PROAIR HFA) 108 (90 BASE) MCG/ACT inhaler Inhale 1 puff into the lungs every 6 (six) hours as needed.    Yes [provider]  amLODipine (NORVASC) 5 MG tablet TAKE ONE TABLET BY MOUTH EVERY DAY 01/02/17  Yes Karamalegos, Devonne Doughty, DO  chlorthalidone (HYGROTON) 25 MG tablet Take 1 tablet (25 mg total) by mouth daily. 01/02/17  Yes Karamalegos, Devonne Doughty, DO  fexofenadine (ALLEGRA ALLERGY) 180 MG tablet Take 180 mg by mouth as needed. Reported on 09/29/2015 01/29/14  Yes [provider]  fluticasone (FLONASE) 50 MCG/ACT nasal spray Place 1 spray into both nostrils as needed.    Yes [provider]  losartan (COZAAR) 100 MG tablet Take 1 tablet (100 mg total) by mouth daily. 07/26/16  Yes Karamalegos, Devonne Doughty, DO  meclizine (ANTIVERT) 25 MG tablet Take 0.5 tablets (12.5 mg total) by mouth 3 (three) times daily as needed for dizziness. 03/29/17  Yes Mikey College, NP  Multiple Vitamins-Minerals (MULTIVITAMIN WOMEN 50+) TABS Take 2 tablets by mouth daily.   Yes [provider]  NEXIUM 20 MG capsule Take 1 capsule (20 mg total) by mouth daily. 07/09/15  Yes Arlis Porta., MD  aspirin EC 81 MG tablet Take 1 tablet (81 mg total) by mouth daily. Patient not taking:  Reported on 05/13/2017 12/12/16   Olin Hauser, DO  exemestane (AROMASIN) 25 MG tablet Take 1 tablet (25 mg total) by mouth daily. Patient not taking: Reported on 05/13/2017 04/28/16   Lequita Asal, MD  exemestane (AROMASIN) 25 MG tablet TAKE ONE TABLET BY MOUTH EVERY DAY Patient not taking: Reported on 05/13/2017 03/01/17   Lequita Asal, MD      VITAL SIGNS:  Blood pressure (!) 165/76, pulse 84, temperature (!) 97.4 F (36.3 C), temperature source Oral, resp. rate (!) 25, height 5' 5.5" (1.664 m), weight 104.3 kg (230 lb), SpO2 97 %.  PHYSICAL EXAMINATION:  Physical Exam  GENERAL:  82 y.o.-year-old patient lying in the bed in no acute distress.  EYES: Pupils equal, round, reactive to light and accommodation. No scleral icterus. Extraocular muscles intact.  HEENT: Head atraumatic, normocephalic. Oropharynx and nasopharynx clear. No oropharyngeal erythema, moist oral mucosa  NECK:  Supple, no jugular venous distention. No thyroid enlargement, no tenderness.  LUNGS: Prolonged inspiratory and expiratory phase,  minimal end expiratory wheezing bilaterally, no rails no rhonchi. No use of accessory muscles of respiration.  CARDIOVASCULAR: S1, S2 RRR. No murmurs, rubs, gallops, clicks.  ABDOMEN: Soft, nontender, nondistended. Bowel sounds present. No organomegaly or mass.  EXTREMITIES: No pedal edema, cyanosis, or clubbing. + 2 pedal & radial pulses b/l.   NEUROLOGIC: Cranial nerves II through XII are intact. No focal Motor or sensory deficits appreciated b/l PSYCHIATRIC: The patient is alert and oriented x 3.   SKIN: No obvious rash, lesion, or ulcer.   LABORATORY PANEL:   CBC Recent Labs  Lab 05/13/17 0704  WBC 9.2  HGB 13.6  HCT 40.6  PLT 206   ------------------------------------------------------------------------------------------------------------------  Chemistries  Recent Labs  Lab 05/13/17 0704  NA 139  K 3.1*  CL 100*  CO2 26  GLUCOSE 154*  BUN  14  CREATININE 0.75  CALCIUM 9.4  AST 25  ALT 19  ALKPHOS 76  BILITOT 0.9   ------------------------------------------------------------------------------------------------------------------  Cardiac Enzymes Recent Labs  Lab 05/13/17 0704  TROPONINI <0.03   ------------------------------------------------------------------------------------------------------------------  RADIOLOGY:  Dg Chest 2 View  Result Date: 05/13/2017 CLINICAL DATA:  Right-sided chest pain, initial encounter EXAM: CHEST  2 VIEW COMPARISON:  09/08/2014 FINDINGS: Cardiac shadow is within normal limits. The lungs are well aerated bilaterally. No focal infiltrate or sizable effusion is seen. Mild lingular scarring is again identified and stable. Bilateral shoulder replacements are noted. No acute bony abnormality is seen. IMPRESSION: No active cardiopulmonary disease. Electronically Signed   By: Inez Catalina M.D.   On: 05/13/2017 07:44   Ct Angio Chest Pe W And/or Wo Contrast  Result Date: 05/13/2017 CLINICAL DATA:  Right-sided chest pain beginning today, worse with inspiration. History of breast cancer. EXAM: CT ANGIOGRAPHY CHEST WITH CONTRAST TECHNIQUE: Multidetector CT imaging of the chest was performed using the standard protocol during bolus administration of intravenous contrast. Multiplanar CT image reconstructions and MIPs were obtained to evaluate the vascular anatomy. CONTRAST:  61mL ISOVUE-370 IOPAMIDOL (ISOVUE-370) INJECTION 76% COMPARISON:  Chest radiography same day.  Chest CT 09/08/2014. FINDINGS: Cardiovascular: Pulmonary arterial opacification is good. There are no pulmonary emboli. There is aortic atherosclerosis. There is atherosclerosis of the brachiocephalic vessels including a 60% stenosis of the proximal left subclavian artery. There is extensive coronary artery calcification. No pericardial fluid. Mediastinum/Nodes: No mass or lymphadenopathy. Lungs/Pleura: Few small emphysematous blebs are noted.  There is mild atelectasis at the lung bases. One could not exclude some early infiltrate in the right lower lobe laterally. Multiple small nodules at both lung bases are unchanged since 2016. These look like areas of bronchiectasis with mucoid impaction, particularly on the coronal imaging. Upper Abdomen: Gallstones.  Otherwise negative. Musculoskeletal: Chronic spinal curvature.  No acute bone finding. Review of the MIP images confirms the above findings. IMPRESSION: No pulmonary emboli. Aortic Atherosclerosis (ICD10-I70.0). Coronary artery calcification. 60% stenosis of the proximal left subclavian artery. Patchy density in the right lower lobe laterally which could represent mild right base pneumonia. Stable small nodules at both lung bases that appear to represent areas of bronchiectasis with mucoid impaction, similar appearance to the study of 2016. Electronically Signed   By: Nelson Chimes M.D.   On: 05/13/2017 09:12     IMPRESSION AND PLAN:   82 year old female with past medical history of essential hypertension, GERD, osteoporosis, history of breast cancer, COPD who presents to the hospital due to right-sided pleuritic chest pain and shortness of breath.  1. Acute on chronic respiratory failure with hypoxia-secondary to COPD exacerbation. -  We'll treat the patient with IV steroids, scheduled DuoNeb's, Pulmicort nebs. Give empiric antibiotics for pneumonia noted on the CT chest. -Patient will need to be assessed for home oxygen prior to discharge.  2. COPD exacerbation-this is the cause of patient's worsening respiratory failure and distress. -We'll treat with IV steroids, scheduled DuoNeb's, Pulmicort nebs. Empiric Levaquin for the pneumonia as noted on CT chest. -Patient will need to be assessed for home oxygen prior to discharge.  3. Hypokalemia-we'll place on oral potassium supplements, check magnesium level and repeat potassium  level in the morning.  4. Essential hypertension-continue  losartan, chlorthalidone, amlodipine.  5. GERD-continue Protonix.   All the records are reviewed and case discussed with ED provider. Management plans discussed with the patient, family and they are in agreement.  CODE STATUS: Full code  TOTAL TIME TAKING CARE OF THIS PATIENT: 40 minutes.    Henreitta Leber M.D on 05/13/2017 at 11:53 AM  Between 7am to 6pm - Pager - [phone]  After 6pm go to [email or web address] - password [email or web address] [facility]  Office  [phone]  CC: Primary care physician; Olin Hauser, DO

## 2017-05-13 NOTE — ED Triage Notes (Signed)
Pt arrives via POV from home with complaints of pain starting this morning. Pt states she is having a sharp pain under right breast that increases when she inhales deeply. Pt states she has been having a lot of congestion and coughing recently and this increases the pain. Pt appears to have labored breathing and coughing some during triage assessment but maintaing O2 rate of 95. Right lower lung sounds seem slightly diminished.

## 2017-05-13 NOTE — ED Provider Notes (Signed)
[facility] Emergency Department Provider Note    First MD Initiated Contact with Patient 05/13/17 [phone]     (approximate)  I have reviewed the triage vital signs and the nursing notes.   HISTORY  Chief Complaint Chest pain   HPI Candace Tapia is a 82 y.o. female a history of COPD as well as breast cancer not on home oxygen or history of DVT presents with sudden onset right sided mild to moderate pleuritic chest pain associated with shortness of breath.  Patient states that she has been having more frequent cough.  No fevers.  No change in consistency or color of phlegm.  Denies any abdominal pain.  No nausea or vomiting.  No lower has not noted any rashes.  Is not taking anything to relieve the pain.  Past Medical History:  Diagnosis Date  . Allergy   . Arthritis   . Breast cancer ([place]) 2014   radiation and taking exemestane  . Cancer of left breast ([place]) 10/27/2014  . COPD (chronic obstructive pulmonary disease) ([place])   . GERD (gastroesophageal reflux disease)   . H/O: hysterectomy   . History of lumpectomy 2014   LT breast  . Hypertension    Family History  Problem Relation Age of Onset  . Breast cancer Mother 8  . Diabetes Mother   . Kidney disease Father   . Diabetes Father   . Breast cancer Sister 62   Past Surgical History:  Procedure Laterality Date  . ABDOMINAL HYSTERECTOMY    . BREAST LUMPECTOMY Left 2014  . BREAST SURGERY Left 2014  . JOINT REPLACEMENT    . LYMPH NODE DISSECTION     neck  . TOTAL SHOULDER REPLACEMENT Bilateral 2010 and 2013   Patient Active Problem List   Diagnosis Date Noted  . COPD exacerbation ([place]) 05/13/2017  . Meniere's disease 03/20/2017  . CKD (chronic kidney disease), stage III ([place]) 12/09/2016  . Chronic venous insufficiency 11/25/2016  . Swelling of limb 11/01/2016  . Bilateral lower extremity edema 10/27/2016  . Pre-diabetes 08/12/2016  . Breast cancer, stage 1, estrogen receptor positive,  left ([place]) 04/28/2016  . Carotid bruit 02/09/2016  . History of left breast cancer 02/09/2016  . Long term current use of opiate analgesic 02/08/2016  . Long term prescription opiate use 02/08/2016  . Encounter for pain management planning 02/08/2016  . Drug level above therapeutic range 02/08/2016  . Chronic low back pain (Location of Tertiary source of pain) (Bilateral) (R>L) 02/08/2016  . Chronic knee pain (Location of Secondary source of pain) (Bilateral) (L>R) 02/08/2016  . History bilateral shoulder replacement 02/08/2016  . Chronic hip pain (Location of Primary Source of Pain) (Bilateral) (R>L) 01/07/2016  . Complete rupture of rotator cuff 01/07/2016  . Hyperlipidemia 01/07/2016  . Osteopenia 01/07/2016  . COPD (chronic obstructive pulmonary disease) with chronic bronchitis ([place]) 07/21/2015  . DDD (degenerative disc disease), lumbar 03/05/2015  . Lumbar facet syndrome (Bilateral) (R>L) 03/05/2015  . Sacroiliac joint dysfunction 03/05/2015  . Osteoarthritis of hip (Bilateral) (R>L) 03/05/2015  . DJD of shoulder 03/05/2015  . Abnormal finding on mammography 03/03/2015  . Back pain with radiation 03/03/2015  . CFIDS (chronic fatigue and immune dysfunction syndrome) ([place]) 03/03/2015  . Major depression in partial remission ([place]) 03/03/2015  . Dry skin 03/03/2015  . Muscle ache 03/03/2015  . Gastro-esophageal reflux disease without esophagitis 03/03/2015  . Allergic rhinitis, seasonal 03/03/2015  . Osteoarthritis of multiple joints 03/03/2015  . Hypertension 11/28/2014  .  Chronic pain 11/28/2014  . Vitamin D deficiency 11/28/2014  . Airway hyperreactivity 09/18/2013  . Microscopic hematuria 12/20/2011      Prior to Admission medications   Medication Sig Start Date End Date Taking? Authorizing Provider  acetaminophen (TYLENOL) 650 MG CR tablet Take 650 mg by mouth every 8 (eight) hours as needed for pain.   Yes [provider]  albuterol (PROAIR HFA) 108 (90 BASE)  MCG/ACT inhaler Inhale 1 puff into the lungs every 6 (six) hours as needed.    Yes [provider]  amLODipine (NORVASC) 5 MG tablet TAKE ONE TABLET BY MOUTH EVERY DAY 01/02/17  Yes Karamalegos, Devonne Doughty, DO  chlorthalidone (HYGROTON) 25 MG tablet Take 1 tablet (25 mg total) by mouth daily. 01/02/17  Yes Karamalegos, Devonne Doughty, DO  fexofenadine (ALLEGRA ALLERGY) 180 MG tablet Take 180 mg by mouth as needed. Reported on 09/29/2015 01/29/14  Yes [provider]  fluticasone (FLONASE) 50 MCG/ACT nasal spray Place 1 spray into both nostrils as needed.    Yes [provider]  losartan (COZAAR) 100 MG tablet Take 1 tablet (100 mg total) by mouth daily. 07/26/16  Yes Karamalegos, Devonne Doughty, DO  meclizine (ANTIVERT) 25 MG tablet Take 0.5 tablets (12.5 mg total) by mouth 3 (three) times daily as needed for dizziness. 03/29/17  Yes Mikey College, NP  Multiple Vitamins-Minerals (MULTIVITAMIN WOMEN 50+) TABS Take 2 tablets by mouth daily.   Yes [provider]  NEXIUM 20 MG capsule Take 1 capsule (20 mg total) by mouth daily. 07/09/15  Yes Arlis Porta., MD  aspirin EC 81 MG tablet Take 1 tablet (81 mg total) by mouth daily. Patient not taking: Reported on 05/13/2017 12/12/16   Olin Hauser, DO  exemestane (AROMASIN) 25 MG tablet Take 1 tablet (25 mg total) by mouth daily. Patient not taking: Reported on 05/13/2017 04/28/16   Lequita Asal, MD  exemestane (AROMASIN) 25 MG tablet TAKE ONE TABLET BY MOUTH EVERY DAY Patient not taking: Reported on 05/13/2017 03/01/17   Lequita Asal, MD    Allergies Tramadol hcl; Statins; Penicillin g; Penicillins; and Sulfa antibiotics    Social History Social History   Tobacco Use  . Smoking status: Former Smoker    Packs/day: 2.00    Years: 40.00    Pack years: 80.00    Last attempt to quit: 04/19/1993    Years since quitting: 24.0  . Smokeless tobacco: Never Used  Substance Use Topics  .  Alcohol use: No    Alcohol/week: 0.0 oz  . Drug use: No    Review of Systems Patient denies headaches, rhinorrhea, blurry vision, numbness, shortness of breath, chest pain, edema, cough, abdominal pain, nausea, vomiting, diarrhea, dysuria, fevers, rashes or hallucinations unless otherwise stated above in HPI. ____________________________________________   PHYSICAL EXAM:  VITAL SIGNS: Vitals:   05/13/17 1438 05/13/17 1438  BP:  (!) 105/48  Pulse:  98  Resp:  18  Temp:  98 F (36.7 C)  SpO2: 96% 98%    Constitutional: Alert and oriented. Short of breath appearing  Eyes: Conjunctivae are normal.  Head: Atraumatic. Nose: No congestion/rhinnorhea. Mouth/Throat: Mucous membranes are moist.   Neck: No stridor. Painless ROM.  Cardiovascular: Normal rate, regular rhythm. Grossly normal heart sounds.  Good peripheral circulation. Respiratory: Normal respiratory effort.  No retractions. Lungs with coarse breathsounds throughout, slightly diminshed in right lung base Gastrointestinal: Soft and nontender. No distention. No abdominal bruits. No CVA tenderness. Genitourinary:  Musculoskeletal: No  lower extremity tenderness nor edema.  No joint effusions. Neurologic:  Normal speech and language. No gross focal neurologic deficits are appreciated. No facial droop Skin:  Skin is warm, dry and intact. No rash noted. Psychiatric: Mood and affect are normal. Speech and behavior are normal.  ____________________________________________   LABS (all labs ordered are listed, but only abnormal results are displayed)  Results for orders placed or performed during the hospital encounter of 05/13/17 (from the past 24 hour(s))  CBC with Differential/Platelet     Status: Abnormal   Collection Time: 05/13/17  7:04 AM  Result Value Ref Range   WBC 9.2 3.6 - 11.0 K/uL   RBC 4.57 3.80 - 5.20 MIL/uL   Hemoglobin 13.6 12.0 - 16.0 g/dL   HCT 40.6 35.0 - 47.0 %   MCV 88.8 80.0 - 100.0 fL   MCH 29.8 26.0  - 34.0 pg   MCHC 33.5 32.0 - 36.0 g/dL   RDW 14.4 11.5 - 14.5 %   Platelets 206 150 - 440 K/uL   Neutrophils Relative % 83 %   Neutro Abs 7.7 (H) 1.4 - 6.5 K/uL   Lymphocytes Relative 9 %   Lymphs Abs 0.8 (L) 1.0 - 3.6 K/uL   Monocytes Relative 5 %   Monocytes Absolute 0.4 0.2 - 0.9 K/uL   Eosinophils Relative 2 %   Eosinophils Absolute 0.2 0 - 0.7 K/uL   Basophils Relative 1 %   Basophils Absolute 0.1 0 - 0.1 K/uL  Comprehensive metabolic panel     Status: Abnormal   Collection Time: 05/13/17  7:04 AM  Result Value Ref Range   Sodium 139 135 - 145 mmol/L   Potassium 3.1 (L) 3.5 - 5.1 mmol/L   Chloride 100 (L) 101 - 111 mmol/L   CO2 26 22 - 32 mmol/L   Glucose, Bld 154 (H) 65 - 99 mg/dL   BUN 14 6 - 20 mg/dL   Creatinine, Ser 0.75 0.44 - 1.00 mg/dL   Calcium 9.4 8.9 - 10.3 mg/dL   Total Protein 7.4 6.5 - 8.1 g/dL   Albumin 3.6 3.5 - 5.0 g/dL   AST 25 15 - 41 U/L   ALT 19 14 - 54 U/L   Alkaline Phosphatase 76 38 - 126 U/L   Total Bilirubin 0.9 0.3 - 1.2 mg/dL   GFR calc non Af Amer >60 >60 mL/min   GFR calc Af Amer >60 >60 mL/min   Anion gap 13 5 - 15  Troponin I     Status: None   Collection Time: 05/13/17  7:04 AM  Result Value Ref Range   Troponin I <0.03 <0.03 ng/mL  Urinalysis, Routine w reflex microscopic     Status: Abnormal   Collection Time: 05/13/17  9:14 AM  Result Value Ref Range   Color, Urine YELLOW (A) YELLOW   APPearance HAZY (A) CLEAR   Specific Gravity, Urine >1.046 (H) 1.005 - 1.030   pH 6.0 5.0 - 8.0   Glucose, UA NEGATIVE NEGATIVE mg/dL   Hgb urine dipstick MODERATE (A) NEGATIVE   Bilirubin Urine NEGATIVE NEGATIVE   Ketones, ur NEGATIVE NEGATIVE mg/dL   Protein, ur NEGATIVE NEGATIVE mg/dL   Nitrite NEGATIVE NEGATIVE   Leukocytes, UA MODERATE (A) NEGATIVE   RBC / HPF 6-30 0 - 5 RBC/hpf   WBC, UA TOO NUMEROUS TO COUNT 0 - 5 WBC/hpf   Bacteria, UA MANY (A) NONE SEEN   Squamous Epithelial / LPF 6-30 (A) NONE SEEN   Hyaline Casts,  UA PRESENT     Magnesium     Status: None   Collection Time: 05/13/17  2:00 PM  Result Value Ref Range   Magnesium 1.7 1.7 - 2.4 mg/dL   ____________________________________________  EKG My review and personal interpretation at Time: 7:08   Indication: sob  Rate: 90  Rhythm: sinus Axis: left Other: lbbb, no stemi, nonspecific st changes, normal intervals ____________________________________________  RADIOLOGY  I personally reviewed all radiographic images ordered to evaluate for the above acute complaints and reviewed radiology reports and findings.  These findings were personally discussed with the patient.  Please see medical record for radiology report.  ____________________________________________   PROCEDURES  Procedure(s) performed:  Procedures    Critical Care performed: no ____________________________________________   INITIAL IMPRESSION / ASSESSMENT AND PLAN / ED COURSE  Pertinent labs & imaging results that were available during my care of the patient were reviewed by me and considered in my medical decision making (see chart for details).  DDX: Asthma, copd, CHF, pna, ptx, malignancy, Pe, anemia   Candace Tapia is a 82 y.o. who presents to the ED with symptoms as described above.  Patient in no cute distress but does have some increased work of breathing.  Presentation concerning for PE.  Chest x-ray shows no consolidation or heart failure.  Blood work is otherwise reassuring.  Will give nebulizers as well as give IV fluids and order CT angiogram as I am concern for PE.  Clinical Course as of May 13 1636  Sat May 13, 2017  0955 Patient reassessed.  No respiratory distress.  Will give dose of Levaquin for evidence of probable pneumonia.  [PR]  1002 She also has evidence of UTI.  Levaquin should also cover this but will send for culture to ensure.  [PR]  1132 Patient with no evidence of PE.  Patient does have evidence of hypoxia with ambulation and given her symptoms I do  feel patient will require admission for acute respiratory failure with hypoxia.   Have discussed with the patient and available family all diagnostics and treatments performed thus far and all questions were answered to the best of my ability. The patient demonstrates understanding and agreement with plan.  [PR]    Clinical Course User Index [PR] Merlyn Lot, MD     ____________________________________________   FINAL CLINICAL IMPRESSION(S) / ED DIAGNOSES  Final diagnoses:  Acute respiratory failure with hypoxia ([place])  Chronic obstructive pulmonary disease with acute exacerbation ([place])  Chest pain, unspecified type      NEW MEDICATIONS STARTED DURING THIS VISIT:  Current Discharge Medication List       Note:  This document was prepared using Dragon voice recognition software and may include unintentional dictation errors.    Merlyn Lot, MD 05/13/17 1640

## 2017-05-14 DIAGNOSIS — J441 Chronic obstructive pulmonary disease with (acute) exacerbation: Secondary | ICD-10-CM | POA: Diagnosis not present

## 2017-05-14 DIAGNOSIS — J9621 Acute and chronic respiratory failure with hypoxia: Secondary | ICD-10-CM | POA: Diagnosis not present

## 2017-05-14 DIAGNOSIS — E876 Hypokalemia: Secondary | ICD-10-CM | POA: Diagnosis not present

## 2017-05-14 DIAGNOSIS — I10 Essential (primary) hypertension: Secondary | ICD-10-CM | POA: Diagnosis not present

## 2017-05-14 LAB — CBC
HEMATOCRIT: 36.2 % (ref 35.0–47.0)
Hemoglobin: 12.3 g/dL (ref 12.0–16.0)
MCH: 30.2 pg (ref 26.0–34.0)
MCHC: 33.9 g/dL (ref 32.0–36.0)
MCV: 89 fL (ref 80.0–100.0)
Platelets: 250 10*3/uL (ref 150–440)
RBC: 4.07 MIL/uL (ref 3.80–5.20)
RDW: 14.4 % (ref 11.5–14.5)
WBC: 9.5 10*3/uL (ref 3.6–11.0)

## 2017-05-14 LAB — BASIC METABOLIC PANEL
Anion gap: 12 (ref 5–15)
BUN: 23 mg/dL — AB (ref 6–20)
CO2: 26 mmol/L (ref 22–32)
Calcium: 9 mg/dL (ref 8.9–10.3)
Chloride: 99 mmol/L — ABNORMAL LOW (ref 101–111)
Creatinine, Ser: 0.94 mg/dL (ref 0.44–1.00)
GFR calc Af Amer: >60 mL/min (ref >60)
GFR, EST NON AFRICAN AMERICAN: 56 mL/min — AB (ref >60)
GLUCOSE: 177 mg/dL — AB (ref 65–99)
POTASSIUM: 3.2 mmol/L — AB (ref 3.5–5.1)
Sodium: 137 mmol/L (ref 135–145)

## 2017-05-14 MED ORDER — IPRATROPIUM-ALBUTEROL 0.5-2.5 (3) MG/3ML IN SOLN
3.0000 mL | Freq: Three times a day (TID) | RESPIRATORY_TRACT | Status: DC
  Administered 2017-05-14 – 2017-05-15 (×4): 3 mL via RESPIRATORY_TRACT
  Filled 2017-05-14 (×4): qty 3

## 2017-05-14 MED ORDER — ALUM & MAG HYDROXIDE-SIMETH 200-200-20 MG/5ML PO SUSP
30.0000 mL | ORAL | Status: DC | PRN
  Administered 2017-05-14: 30 mL via ORAL
  Filled 2017-05-14: qty 30

## 2017-05-14 MED ORDER — IPRATROPIUM-ALBUTEROL 0.5-2.5 (3) MG/3ML IN SOLN: 3.0000 mL | Freq: Four times a day (QID) | RESPIRATORY_TRACT | Status: DC | PRN

## 2017-05-14 NOTE — Progress Notes (Signed)
Family Meeting Note  Advance Directive:yes  Today a meeting took place with the Patient.     The following clinical team members were present during this meeting:MD  The following were discussed:Patient's diagnosis: Acute on chronic hypoxic respiratory failure, COPD exacerbation, history of breast cancer status post chemoradiation patient's progosis: Unable to determine and Goals for treatment: DNR, son Elta [place] is the healthcare power of attorney  Additional follow-up to be provided: Hospitalist and primary care physician  Time spent during discussion: 18 minutes  Nicholes Mango, MD

## 2017-05-14 NOTE — Progress Notes (Signed)
[place] [place] NAME: Candace Tapia    MR#:  [id number]  DATE OF BIRTH:  [birth year]  SUBJECTIVE:  CHIEF COMPLAINT: Patient shortness of breath and cough are better today.  REVIEW OF SYSTEMS:  CONSTITUTIONAL: No fever, fatigue or weakness.  EYES: No blurred or double vision.  EARS, NOSE, AND THROAT: No tinnitus or ear pain.  RESPIRATORY: Improving cough, shortness of breath, denies wheezing or hemoptysis.  CARDIOVASCULAR: No chest pain, orthopnea, edema.  GASTROINTESTINAL: No nausea, vomiting, diarrhea or abdominal pain.  GENITOURINARY: No dysuria, hematuria.  ENDOCRINE: No polyuria, nocturia,  HEMATOLOGY: No anemia, easy bruising or bleeding SKIN: No rash or lesion. MUSCULOSKELETAL: No joint pain or arthritis.   NEUROLOGIC: No tingling, numbness, weakness.  PSYCHIATRY: No anxiety or depression.   DRUG ALLERGIES:   Allergies  Allergen Reactions  . Tramadol Hcl Itching    Per pt it caused me to itch all over  . Statins     Not sure what exact med was; just made her feel bad (can't elaborate)  . Penicillin G Rash  . Penicillins Rash    Has patient had a PCN reaction causing immediate rash, facial/tongue/throat swelling, SOB or lightheadedness with hypotension: No Has patient had a PCN reaction causing severe rash involving mucus membranes or skin necrosis: No Has patient had a PCN reaction that required hospitalization: No Has patient had a PCN reaction occurring within the last 10 years: No If all of the above answers are "NO", then may proceed with Cephalosporin use.  . Sulfa Antibiotics Other (See Comments) and Rash    VITALS:  Blood pressure (!) 110/43, pulse 100, temperature 97.9 F (36.6 C), temperature source Oral, resp. rate 18, height 5' 5.5" (1.664 m), weight 104.3 kg (230 lb), SpO2 95 %.  PHYSICAL EXAMINATION:  GENERAL:  82 y.o.-year-old patient lying in the bed with no acute distress.  EYES: Pupils  equal, round, reactive to light and accommodation. No scleral icterus. Extraocular muscles intact.  HEENT: Head atraumatic, normocephalic. Oropharynx and nasopharynx clear.  NECK:  Supple, no jugular venous distention. No thyroid enlargement, no tenderness.  LUNGS: Moderate breath sounds bilaterally, no wheezing, rales,rhonchi or crepitation. No use of accessory muscles of respiration.  CARDIOVASCULAR: S1, S2 normal. No murmurs, rubs, or gallops.  ABDOMEN: Soft, nontender, nondistended. Bowel sounds present. No organomegaly or mass.  EXTREMITIES: No pedal edema, cyanosis, or clubbing.  NEUROLOGIC: Cranial nerves II through XII are intact. Muscle strength 5/5 in all extremities. Sensation intact. Gait not checked.  PSYCHIATRIC: The patient is alert and oriented x 3.  SKIN: No obvious rash, lesion, or ulcer.    LABORATORY PANEL:   CBC Recent Labs  Lab 05/14/17 0323  WBC 9.5  HGB 12.3  HCT 36.2  PLT 250   ------------------------------------------------------------------------------------------------------------------  Chemistries  Recent Labs  Lab 05/13/17 0704 05/13/17 1400 05/14/17 0323  NA 139  --  137  K 3.1*  --  3.2*  CL 100*  --  99*  CO2 26  --  26  GLUCOSE 154*  --  177*  BUN 14  --  23*  CREATININE 0.75  --  0.94  CALCIUM 9.4  --  9.0  MG  --  1.7  --   AST 25  --   --   ALT 19  --   --   ALKPHOS 76  --   --   BILITOT 0.9  --   --    ------------------------------------------------------------------------------------------------------------------  Cardiac Enzymes Recent Labs  Lab 05/13/17 0704  TROPONINI <0.03   ------------------------------------------------------------------------------------------------------------------  RADIOLOGY:  Dg Chest 2 View  Result Date: 05/13/2017 CLINICAL DATA:  Right-sided chest pain, initial encounter EXAM: CHEST  2 VIEW COMPARISON:  09/08/2014 FINDINGS: Cardiac shadow is within normal limits. The lungs are well  aerated bilaterally. No focal infiltrate or sizable effusion is seen. Mild lingular scarring is again identified and stable. Bilateral shoulder replacements are noted. No acute bony abnormality is seen. IMPRESSION: No active cardiopulmonary disease. Electronically Signed   By: Inez Catalina M.D.   On: 05/13/2017 07:44   Ct Angio Chest Pe W And/or Wo Contrast  Result Date: 05/13/2017 CLINICAL DATA:  Right-sided chest pain beginning today, worse with inspiration. History of breast cancer. EXAM: CT ANGIOGRAPHY CHEST WITH CONTRAST TECHNIQUE: Multidetector CT imaging of the chest was performed using the standard protocol during bolus administration of intravenous contrast. Multiplanar CT image reconstructions and MIPs were obtained to evaluate the vascular anatomy. CONTRAST:  35mL ISOVUE-370 IOPAMIDOL (ISOVUE-370) INJECTION 76% COMPARISON:  Chest radiography same day.  Chest CT 09/08/2014. FINDINGS: Cardiovascular: Pulmonary arterial opacification is good. There are no pulmonary emboli. There is aortic atherosclerosis. There is atherosclerosis of the brachiocephalic vessels including a 60% stenosis of the proximal left subclavian artery. There is extensive coronary artery calcification. No pericardial fluid. Mediastinum/Nodes: No mass or lymphadenopathy. Lungs/Pleura: Few small emphysematous blebs are noted. There is mild atelectasis at the lung bases. One could not exclude some early infiltrate in the right lower lobe laterally. Multiple small nodules at both lung bases are unchanged since 2016. These look like areas of bronchiectasis with mucoid impaction, particularly on the coronal imaging. Upper Abdomen: Gallstones.  Otherwise negative. Musculoskeletal: Chronic spinal curvature.  No acute bone finding. Review of the MIP images confirms the above findings. IMPRESSION: No pulmonary emboli. Aortic Atherosclerosis (ICD10-I70.0). Coronary artery calcification. 60% stenosis of the proximal left subclavian artery.  Patchy density in the right lower lobe laterally which could represent mild right base pneumonia. Stable small nodules at both lung bases that appear to represent areas of bronchiectasis with mucoid impaction, similar appearance to the study of 2016. Electronically Signed   By: Nelson Chimes M.D.   On: 05/13/2017 09:12    EKG:   Orders placed or performed during the hospital encounter of 05/13/17  . ED EKG  . ED EKG    ASSESSMENT AND PLAN:   82 year old female with past medical history of essential hypertension, GERD, osteoporosis, history of breast cancer, COPD who presents to the hospital due to right-sided pleuritic chest pain and shortness of breath.  1. Acute on chronic respiratory failure with hypoxia-secondary to COPD exacerbation. -We'll treat the patient with IV steroids, scheduled DuoNeb's, Pulmicort nebs. Give empiric antibiotics for pneumonia noted on the CT chest. -Patient will need to be assessed for home oxygen prior to discharge.  Will check ambulatory pulse ox  2. COPD exacerbation-this is the cause of patient's worsening respiratory failure and distress. -Clinically improving -We'll  continue with IV steroids and taper, scheduled DuoNeb's, Pulmicort nebs. Empiric Levaquin for the pneumonia as noted on CT chest. -Patient will need to be assessed for home oxygen prior to discharge.  3. Hypokalemia-we'll place on oral potassium supplements, check magnesium level and repeat potassium  level in the morning.  4. Essential hypertension-continue losartan, chlorthalidone, amlodipine.  5. GERD-continue Protonix.       All the records are reviewed and case discussed with Care Management/Social Workerr. Management plans discussed with the patient,  family and they are in agreement.  CODE STATUS: dnr   TOTAL TIME TAKING CARE OF THIS PATIENT: 36  minutes.   POSSIBLE D/C IN 1-2  DAYS, DEPENDING ON CLINICAL CONDITION.  Note: This dictation was prepared with Dragon  dictation along with smaller phrase technology. Any transcriptional errors that result from this process are unintentional.   Nicholes Mango M.D on 05/14/2017 at 1:32 PM  Between 7am to 6pm - Pager - [phone] After 6pm go to [email or web address] - password [email or web address] [facility]  Office  [phone]  CC: Primary care physician; Olin Hauser, DO

## 2017-05-14 NOTE — Progress Notes (Signed)
Potassium 3.2. MD notified.

## 2017-05-15 DIAGNOSIS — E876 Hypokalemia: Secondary | ICD-10-CM | POA: Diagnosis not present

## 2017-05-15 DIAGNOSIS — J9621 Acute and chronic respiratory failure with hypoxia: Secondary | ICD-10-CM | POA: Diagnosis not present

## 2017-05-15 DIAGNOSIS — I10 Essential (primary) hypertension: Secondary | ICD-10-CM | POA: Diagnosis not present

## 2017-05-15 DIAGNOSIS — J441 Chronic obstructive pulmonary disease with (acute) exacerbation: Secondary | ICD-10-CM | POA: Diagnosis not present

## 2017-05-15 LAB — BASIC METABOLIC PANEL
ANION GAP: 9 (ref 5–15)
BUN: 39 mg/dL — AB (ref 6–20)
CO2: 28 mmol/L (ref 22–32)
Calcium: 9.1 mg/dL (ref 8.9–10.3)
Chloride: 100 mmol/L — ABNORMAL LOW (ref 101–111)
Creatinine, Ser: 0.96 mg/dL (ref 0.44–1.00)
GFR calc Af Amer: >60 mL/min (ref >60)
GFR, EST NON AFRICAN AMERICAN: 55 mL/min — AB (ref >60)
Glucose, Bld: 172 mg/dL — ABNORMAL HIGH (ref 65–99)
Potassium: 3.6 mmol/L (ref 3.5–5.1)
SODIUM: 137 mmol/L (ref 135–145)

## 2017-05-15 LAB — MAGNESIUM: MAGNESIUM: 2.2 mg/dL (ref 1.7–2.4)

## 2017-05-15 MED ORDER — FLUTICASONE-SALMETEROL 115-21 MCG/ACT IN AERO: 2.0000 | INHALATION_SPRAY | Freq: Two times a day (BID) | RESPIRATORY_TRACT | 1 refills | Status: DC

## 2017-05-15 MED ORDER — SALINE SPRAY 0.65 % NA SOLN
1.0000 | NASAL | Status: DC | PRN
  Administered 2017-05-15: 1 via NASAL
  Filled 2017-05-15: qty 44

## 2017-05-15 MED ORDER — LEVOFLOXACIN 750 MG PO TABS: 750.0000 mg | ORAL_TABLET | Freq: Every day | ORAL | 0 refills | Status: AC

## 2017-05-15 MED ORDER — PREDNISONE 10 MG (21) PO TBPK: 10.0000 mg | ORAL_TABLET | Freq: Every day | ORAL | 0 refills | Status: DC

## 2017-05-15 NOTE — Discharge Summary (Signed)
[facility] [place] NAME: Candace Tapia    MR#:  [id number]  DATE OF BIRTH:  06-Apr-1936  DATE OF ADMISSION:  05/13/2017 ADMITTING PHYSICIAN: Henreitta Leber, MD  DATE OF DISCHARGE: 05/15/17  PRIMARY CARE PHYSICIAN: Olin Hauser, DO    ADMISSION DIAGNOSIS:  Acute respiratory failure with hypoxia (HCC) [J96.01] Chronic obstructive pulmonary disease with acute exacerbation (HCC) [J44.1] Chest pain, unspecified type [R07.9]  DISCHARGE DIAGNOSIS:  Active Problems:   COPD exacerbation (HCC)  Urinary tract infection SECONDARY DIAGNOSIS:   Past Medical History:  Diagnosis Date  . Allergy   . Arthritis   . Breast cancer ([facility]) 2014   radiation and taking exemestane  . Cancer of left breast ([facility]) 10/27/2014  . COPD (chronic obstructive pulmonary disease) ([facility])   . GERD (gastroesophageal reflux disease)   . H/O: hysterectomy   . History of lumpectomy 2014   LT breast  . Hypertension     HOSPITAL COURSE:   [facility] Candace Tapia  is a 82 y.o. female with a known history of COPD, essential hypertension, history of breast cancer status post chemoradiation, GERD, osteoarthritis who presents to the hospital due to right sided pleuritic chest pain and shortness of breath. Patient says that she developed some right-sided pleuritic chest pain last night after she went to use the bathroom. It persisted throughout the night and therefore she was concerned and came to the ER for further evaluation. Patient underwent a CT of the chest which was negative for pulmonary embolism but was concerning for possible right-sided pneumonia. Patient was also hypoxic on ambulation in the ER and noted to be in acute on chronic respiratory failure with hypoxia and hospitalist services were collected for treatment and evaluation. Patient says that she has baseline exertional shortness of breath but it has gotten progressively worse over the past few  months. She admits to a cough which is productive with clear sputum but no.related fevers, chills, nausea, vomiting or any other associated symptoms presently.  1. Acute on chronic respiratory failure with hypoxia-secondary to COPD exacerbation. -We'll treat the patient with IV steroids, scheduled DuoNeb's, Pulmicort nebs. Give empiric antibiotics for pneumonia noted on the CT chest. -Patient will need to be assessed for home oxygen prior to discharge.  Patient does not meet criteria for home oxygen  2. COPD exacerbation-this isthecause of patient's worsening respiratory failure and distress. -Clinically improving -We'll  continue with IV steroids and taper, scheduled DuoNeb's, Pulmicort nebs. Empiric Levaquin for the pneumonia as noted on CT chest.  3.Hypokalemia-we'll place on oral potassium supplements, check magnesium level and repeatpotassiumlevel in the morning.  4. Essential hypertension-continue losartan, chlorthalidone, amlodipine.  5. GERD-continue Protonix.  6.  UTI urine cultures with gram-negative rods.  PCP to follow-up on the final sensitivity results.  Patient will be discharged home with levo floxacin for a total of 5 days  DISCHARGE CONDITIONS:   stable  CONSULTS OBTAINED:     PROCEDURES  None   DRUG ALLERGIES:   Allergies  Allergen Reactions  . Tramadol Hcl Itching    Per pt it caused me to itch all over  . Statins     Not sure what exact med was; just made her feel bad (can't elaborate)  . Penicillin G Rash  . Penicillins Rash    Has patient had a PCN reaction causing immediate rash, facial/tongue/throat swelling, SOB or lightheadedness with hypotension: No Has patient had a PCN reaction causing severe rash involving mucus  membranes or skin necrosis: No Has patient had a PCN reaction that required hospitalization: No Has patient had a PCN reaction occurring within the last 10 years: No If all of the above answers are "NO", then may proceed with  Cephalosporin use.  . Sulfa Antibiotics Other (See Comments) and Rash    DISCHARGE MEDICATIONS:   Allergies as of 05/15/2017      Reactions   Tramadol Hcl Itching   Per pt it caused me to itch all over   Statins    Not sure what exact med was; just made her feel bad (can't elaborate)   Penicillin G Rash   Penicillins Rash   Has patient had a PCN reaction causing immediate rash, facial/tongue/throat swelling, SOB or lightheadedness with hypotension: No Has patient had a PCN reaction causing severe rash involving mucus membranes or skin necrosis: No Has patient had a PCN reaction that required hospitalization: No Has patient had a PCN reaction occurring within the last 10 years: No If all of the above answers are "NO", then may proceed with Cephalosporin use.   Sulfa Antibiotics Other (See Comments), Rash      Medication List    STOP taking these medications   exemestane 25 MG tablet Commonly known as:  AROMASIN     TAKE these medications   acetaminophen 650 MG CR tablet Commonly known as:  TYLENOL Take 650 mg by mouth every 8 (eight) hours as needed for pain.   ALLEGRA ALLERGY 180 MG tablet Generic drug:  fexofenadine Take 180 mg by mouth as needed. Reported on 09/29/2015   amLODipine 5 MG tablet Commonly known as:  NORVASC TAKE ONE TABLET BY MOUTH EVERY DAY   aspirin EC 81 MG tablet Take 1 tablet (81 mg total) by mouth daily.   chlorthalidone 25 MG tablet Commonly known as:  HYGROTON Take 1 tablet (25 mg total) by mouth daily.   fluticasone 50 MCG/ACT nasal spray Commonly known as:  FLONASE Place 1 spray into both nostrils as needed.   fluticasone-salmeterol 115-21 MCG/ACT inhaler Commonly known as:  ADVAIR HFA Inhale 2 puffs into the lungs 2 (two) times daily.   levofloxacin 750 MG tablet Commonly known as:  LEVAQUIN Take 1 tablet (750 mg total) by mouth daily for 7 days.   losartan 100 MG tablet Commonly known as:  COZAAR Take 1 tablet (100 mg total) by  mouth daily.   meclizine 25 MG tablet Commonly known as:  ANTIVERT Take 0.5 tablets (12.5 mg total) by mouth 3 (three) times daily as needed for dizziness.   MULTIVITAMIN WOMEN 50+ Tabs Take 2 tablets by mouth daily.   NEXIUM 20 MG capsule Generic drug:  esomeprazole Take 1 capsule (20 mg total) by mouth daily.   predniSONE 10 MG (21) Tbpk tablet Commonly known as:  STERAPRED UNI-PAK 21 TAB Take 1 tablet (10 mg total) by mouth daily. Take 6 tablets by mouth for 1 day followed by  5 tablets by mouth for 1 day followed by  4 tablets by mouth for 1 day followed by  3 tablets by mouth for 1 day followed by  2 tablets by mouth for 1 day followed by  1 tablet by mouth for a day and stop   PROAIR HFA 108 (90 Base) MCG/ACT inhaler Generic drug:  albuterol Inhale 1 puff into the lungs every 6 (six) hours as needed.        DISCHARGE INSTRUCTIONS:  Follow-up with primary care physician in 5-7 days.  PCP to  follow-up on the pending urine culture sensitivity results.   DIET:  Cardiac diet  DISCHARGE CONDITION:  Stable  ACTIVITY:  Activity as tolerated  OXYGEN:  Home Oxygen: No.   Oxygen Delivery: room air  DISCHARGE LOCATION:  home   If you experience worsening of your admission symptoms, develop shortness of breath, life threatening emergency, suicidal or homicidal thoughts you must seek medical attention immediately by calling 911 or calling your MD immediately  if symptoms less severe.  You Must read complete instructions/literature along with all the possible adverse reactions/side effects for all the Medicines you take and that have been prescribed to you. Take any new Medicines after you have completely understood and accpet all the possible adverse reactions/side effects.   Please note  You were cared for by a hospitalist during your hospital stay. If you have any questions about your discharge medications or the care you received while you were in the hospital  after you are discharged, you can call the unit and asked to speak with the hospitalist on call if the hospitalist that took care of you is not available. Once you are discharged, your primary care physician will handle any further medical issues. Please note that NO REFILLS for any discharge medications will be authorized once you are discharged, as it is imperative that you return to your primary care physician (or establish a relationship with a primary care physician if you do not have one) for your aftercare needs so that they can reassess your need for medications and monitor your lab values.     Today  Chief Complaint  Patient presents with  . Abdominal Pain   Patient is feeling fine.  No complaints.  Wants to go home.  ROS:  CONSTITUTIONAL: Denies fevers, chills. Denies any fatigue, weakness.  EYES: Denies blurry vision, double vision, eye pain. EARS, NOSE, THROAT: Denies tinnitus, ear pain, hearing loss. RESPIRATORY: Denies cough, wheeze, shortness of breath.  CARDIOVASCULAR: Denies chest pain, palpitations, edema.  GASTROINTESTINAL: Denies nausea, vomiting, diarrhea, abdominal pain. Denies bright red blood per rectum. GENITOURINARY: Denies dysuria, hematuria. ENDOCRINE: Denies nocturia or thyroid problems. HEMATOLOGIC AND LYMPHATIC: Denies easy bruising or bleeding. SKIN: Denies rash or lesion. MUSCULOSKELETAL: Denies pain in neck, back, shoulder, knees, hips or arthritic symptoms.  NEUROLOGIC: Denies paralysis, paresthesias.  PSYCHIATRIC: Denies anxiety or depressive symptoms.   VITAL SIGNS:  Blood pressure (!) 147/53, pulse 98, temperature 98.5 F (36.9 C), temperature source Oral, resp. rate 17, height 5' 5.5" (1.664 m), weight 104.3 kg (230 lb), SpO2 95 %.  I/O:    Intake/Output Summary (Last 24 hours) at 05/15/2017 1057 Last data filed at 05/15/2017 0900 Gross per 24 hour  Intake 720 ml  Output -  Net 720 ml    PHYSICAL EXAMINATION:  GENERAL:  82 y.o.-year-old  patient lying in the bed with no acute distress.  EYES: Pupils equal, round, reactive to light and accommodation. No scleral icterus. Extraocular muscles intact.  HEENT: Head atraumatic, normocephalic. Oropharynx and nasopharynx clear.  NECK:  Supple, no jugular venous distention. No thyroid enlargement, no tenderness.  LUNGS: Normal breath sounds bilaterally, no wheezing, rales,rhonchi or crepitation. No use of accessory muscles of respiration.  CARDIOVASCULAR: S1, S2 normal. No murmurs, rubs, or gallops.  ABDOMEN: Soft, non-tender, non-distended. Bowel sounds present. No organomegaly or mass.  EXTREMITIES: No pedal edema, cyanosis, or clubbing.  NEUROLOGIC: Cranial nerves II through XII are intact. Muscle strength 5/5 in all extremities. Sensation intact. Gait not checked.  PSYCHIATRIC: The patient  is alert and oriented x 3.  SKIN: No obvious rash, lesion, or ulcer.   DATA REVIEW:   CBC Recent Labs  Lab 05/14/17 0323  WBC 9.5  HGB 12.3  HCT 36.2  PLT 250    Chemistries  Recent Labs  Lab 05/13/17 0704  05/15/17 0334  NA 139   < > 137  K 3.1*   < > 3.6  CL 100*   < > 100*  CO2 26   < > 28  GLUCOSE 154*   < > 172*  BUN 14   < > 39*  CREATININE 0.75   < > 0.96  CALCIUM 9.4   < > 9.1  MG  --    < > 2.2  AST 25  --   --   ALT 19  --   --   ALKPHOS 76  --   --   BILITOT 0.9  --   --    < > = values in this interval not displayed.    Cardiac Enzymes Recent Labs  Lab 05/13/17 0704  TROPONINI <0.03    Microbiology Results  Results for orders placed or performed during the hospital encounter of 05/13/17  Urine culture     Status: Abnormal (Preliminary result)   Collection Time: 05/13/17 10:04 AM  Result Value Ref Range Status   Specimen Description   Final    URINE, RANDOM Performed at [facility], [address] [place], [place] [address]    Special Requests   Final    NONE Performed at [facility], [address], [place], [place]  [address]    Culture >=100,000 COLONIES/mL GRAM NEGATIVE RODS (A)  Final   Report Status PENDING  Incomplete    RADIOLOGY:  Dg Chest 2 View  Result Date: 05/13/2017 CLINICAL DATA:  Right-sided chest pain, initial encounter EXAM: CHEST  2 VIEW COMPARISON:  09/08/2014 FINDINGS: Cardiac shadow is within normal limits. The lungs are well aerated bilaterally. No focal infiltrate or sizable effusion is seen. Mild lingular scarring is again identified and stable. Bilateral shoulder replacements are noted. No acute bony abnormality is seen. IMPRESSION: No active cardiopulmonary disease. Electronically Signed   By: Inez Catalina M.D.   On: 05/13/2017 07:44   Ct Angio Chest Pe W And/or Wo Contrast  Result Date: 05/13/2017 CLINICAL DATA:  Right-sided chest pain beginning today, worse with inspiration. History of breast cancer. EXAM: CT ANGIOGRAPHY CHEST WITH CONTRAST TECHNIQUE: Multidetector CT imaging of the chest was performed using the standard protocol during bolus administration of intravenous contrast. Multiplanar CT image reconstructions and MIPs were obtained to evaluate the vascular anatomy. CONTRAST:  42mL ISOVUE-370 IOPAMIDOL (ISOVUE-370) INJECTION 76% COMPARISON:  Chest radiography same day.  Chest CT 09/08/2014. FINDINGS: Cardiovascular: Pulmonary arterial opacification is good. There are no pulmonary emboli. There is aortic atherosclerosis. There is atherosclerosis of the brachiocephalic vessels including a 60% stenosis of the proximal left subclavian artery. There is extensive coronary artery calcification. No pericardial fluid. Mediastinum/Nodes: No mass or lymphadenopathy. Lungs/Pleura: Few small emphysematous blebs are noted. There is mild atelectasis at the lung bases. One could not exclude some early infiltrate in the right lower lobe laterally. Multiple small nodules at both lung bases are unchanged since 2016. These look like areas of bronchiectasis with mucoid impaction, particularly on the  coronal imaging. Upper Abdomen: Gallstones.  Otherwise negative. Musculoskeletal: Chronic spinal curvature.  No acute bone finding. Review of the MIP images confirms the above findings. IMPRESSION: No pulmonary emboli.  Aortic Atherosclerosis (ICD10-I70.0). Coronary artery calcification. 60% stenosis of the proximal left subclavian artery. Patchy density in the right lower lobe laterally which could represent mild right base pneumonia. Stable small nodules at both lung bases that appear to represent areas of bronchiectasis with mucoid impaction, similar appearance to the study of 2016. Electronically Signed   By: Nelson Chimes M.D.   On: 05/13/2017 09:12    EKG:   Orders placed or performed during the hospital encounter of 05/13/17  . ED EKG  . ED EKG      Management plans discussed with the patient, family and they are in agreement.  CODE STATUS:     Code Status Orders  (From admission, onward)        Start     Ordered   05/14/17 1330  Do not attempt resuscitation (DNR)  Continuous    Question Answer Comment  In the event of cardiac or respiratory ARREST Do not call a "code blue"   In the event of cardiac or respiratory ARREST Do not perform Intubation, CPR, defibrillation or ACLS   In the event of cardiac or respiratory ARREST Use medication by any route, position, wound care, and other measures to relive pain and suffering. May use oxygen, suction and manual treatment of airway obstruction as needed for comfort.   Comments RN may pronounce      05/14/17 1329    Code Status History    Date Active Date Inactive Code Status Order ID Comments User Context   05/13/2017 13:21 05/14/2017 13:29 Full Code [id number]  Henreitta Leber, MD Inpatient    Advance Directive Documentation     Most Recent Value  Type of Advance Directive  Healthcare Power of Attorney  Pre-existing out of facility DNR order (yellow form or pink MOST form)  No data  "MOST" Form in Place?  No data      TOTAL  TIME TAKING CARE OF THIS PATIENT: 43  minutes.   Note: This dictation was prepared with Dragon dictation along with smaller phrase technology. Any transcriptional errors that result from this process are unintentional.   @[facility] @  on 05/15/2017 at 10:57 AM  Between 7am to 6pm - Pager - [phone]  After 6pm go to [email or web address] - password [email or web address] [facility]  Office  [phone]  CC: Primary care physician; Olin Hauser, DO

## 2017-05-15 NOTE — Discharge Instructions (Signed)
Follow-up with primary care physician in 5-7 days.  PCP to follow-up on the pending urine culture sensitivity results.

## 2017-05-15 NOTE — Progress Notes (Addendum)
Pt ready for d/c home today per MD. Pt ambulated around unit this morning on RA with no complications. Pt's O2 saturations are 93-97% on RA, wears 2L [place] chronically at night. Prescriptions and discharge instructions reviewed with pt and daughter-in-law, all questions answered. PIVs removed, pt in NAD, all belongings packed. Pt assisted to car via volunteer.   [place], Jerry Caras

## 2017-05-15 NOTE — Progress Notes (Signed)
Pt ambulated around nurses station. O2 sat 96% before ambulation and 92% after. Pt tolerated well.

## 2017-05-16 LAB — URINE CULTURE: Culture: >=100000 — AB

## 2017-05-17 ENCOUNTER — Telehealth: Payer: Self-pay

## 2017-05-17 NOTE — Telephone Encounter (Signed)
Transition Care Management Follow-up Telephone Call   Date discharged? 05/15/2017   How have you been since you were released from the hospital? "Very well"   Do you understand why you were in the hospital? yes   Do you understand the discharge instructions? yes   Where were you discharged to? home   Items Reviewed:  Medications reviewed: yes  Allergies reviewed: yes  Dietary changes reviewed: yes  Referrals reviewed: yes   Functional Questionnaire:   Activities of Daily Living (ADLs):   She states they are independent in the following: ambulation, dressing, continence, grooming,tolieting,feeding States they require assistance with the following: none   Any transportation issues/concerns?: no   Any patient concerns? no   Confirmed importance and date/time of follow-up visits scheduled yes  Provider Appointment booked with Dr.Karamalegos on Feb 5th, 2019 at 11:00am   Confirmed with patient if condition begins to worsen call PCP or go to the ER.  Patient was given the office number and encouraged to call back with question or concerns.  : yes

## 2017-05-17 NOTE — Telephone Encounter (Signed)
I have made the 1st attempt to contact the patient or family member in charge, in order to follow up from recently being discharged from the hospital. I left a message on voicemail but I will make another attempt at a different time.   Direct call back [phone] 

## 2017-05-23 ENCOUNTER — Ambulatory Visit (INDEPENDENT_AMBULATORY_CARE_PROVIDER_SITE_OTHER): Payer: Medicare Other | Admitting: Family Medicine

## 2017-05-23 ENCOUNTER — Encounter: Payer: Self-pay | Admitting: Family Medicine

## 2017-05-23 VITALS — BP 137/61 | HR 96 | Temp 97.8°F | Resp 16 | Ht 65.5 in | Wt 227.0 lb

## 2017-05-23 DIAGNOSIS — J181 Lobar pneumonia, unspecified organism: Secondary | ICD-10-CM

## 2017-05-23 DIAGNOSIS — E876 Hypokalemia: Secondary | ICD-10-CM

## 2017-05-23 DIAGNOSIS — R42 Dizziness and giddiness: Secondary | ICD-10-CM

## 2017-05-23 DIAGNOSIS — J189 Pneumonia, unspecified organism: Secondary | ICD-10-CM

## 2017-05-23 DIAGNOSIS — J441 Chronic obstructive pulmonary disease with (acute) exacerbation: Secondary | ICD-10-CM

## 2017-05-23 DIAGNOSIS — J449 Chronic obstructive pulmonary disease, unspecified: Secondary | ICD-10-CM | POA: Diagnosis not present

## 2017-05-23 NOTE — Patient Instructions (Addendum)
Thank you for coming to the office today.  1. Keep up the good work  Follow-up with Dr Tami Ribas and vestibular rehab for inner ear problem, I am encouraged that you will continue to make progress  2. Finished antibiotic and prednisone - notify office if significant worsening cough or fever or breathing may need extend this course  3. Low potassium, will re-check result in 1 week  DUE for FASTING BLOOD WORK (no food or drink after midnight before the lab appointment, only water or coffee without cream/sugar on the morning of)  SCHEDULE "Lab Only" visit in the morning at the clinic for lab draw in 1 WEEK  - Make sure Lab Only appointment is at about 1 week before your next appointment, so that results will be available  For Lab Results, once available within 2-3 days of blood draw, you can can log in to MyChart online to view your results and a brief explanation. Also, we can discuss results at next follow-up visit.   Please schedule a Follow-up Appointment to: Return in about 1 week (around 05/30/2017), or if symptoms worsen or fail to improve.    If you have any other questions or concerns, please feel free to call the office or send a message through [facility]. You may also schedule an earlier appointment if necessary.  Additionally, you may be receiving a survey about your experience at our office within a few days to 1 week by e-mail or mail. We value your feedback.  Nobie Putnam, DO [place]

## 2017-05-23 NOTE — Progress Notes (Signed)
Subjective:    Patient ID: Candace Tapia, female    DOB: 26-Jan-1936, 82 y.o.   MRN: [id number]  Candace Tapia is a 82 y.o. female presenting on 05/23/2017 for Hospitalization Follow-up  Patient is accompanied by family who provide additional history today.  HPI  HOSPITAL FOLLOW-UP VISIT  Hospital/Location: [facility] Date of Admission: 05/13/17 Date of Discharge: 05/15/17 Transitions of care telephone call: Completed by Tyler Aas LPN on 08/12/04  Reason for Admission: Acute Respiratory Failure / Right Lower Lobe Pneumonia  - Hospital H&P and Discharge Summary have been reviewed - Patient presents today 8 days after recent hospitalization. Brief summary of recent course, patient had symptoms started with R sided chest wall or flank pain, and she was concerned, went to hospital ED and they did CXR and CT Chest ruled out PE but found R lower pneumonia, hypoxic and dyspnea, admitted, treated for CAP vs COPD Exac, on IV steroids, duonebs, levaquin.  - Today reports overall has done well after discharge. Symptoms of dyspnea, R side pain and respiratory symptoms have resolved.  - Additionally had dx UTI, was asymptomatic before but had concerning urine. Treated with Levaquin as well - Hypokalemia, which improved in hospital, last checked last week with back to normal range 3.6, given potassium supplement  - New medications on discharge: Prednisone 60mg  down to 10 over 6 day taper and Levaquin 750mg  daily x 7 days - finished now, K supplement  - Changes to current meds on discharge: None  Additional history Vertigo / Inner Ear Problem - was dx with Meniere's previously now followed by Dr Jess Barters ENT, treated with vestibular rehab, had improvement then recent setback with hospitalization, now more dizzy and vertigo. Also had audiology testing, awaiting hearing aids, has lost most of hearing on L ear and partial on R  I have reviewed the discharge medication list, and have reconciled  the current and discharge medications today.   Current Outpatient Medications:  .  acetaminophen (TYLENOL) 650 MG CR tablet, Take 650 mg by mouth every 8 (eight) hours as needed for pain., Disp: , Rfl:  .  albuterol (PROAIR HFA) 108 (90 BASE) MCG/ACT inhaler, Inhale 1 puff into the lungs every 6 (six) hours as needed. , Disp: , Rfl:  .  amLODipine (NORVASC) 5 MG tablet, TAKE ONE TABLET BY MOUTH EVERY DAY, Disp: 90 tablet, Rfl: 3 .  aspirin EC 81 MG tablet, Take 1 tablet (81 mg total) by mouth daily., Disp: , Rfl:  .  chlorthalidone (HYGROTON) 25 MG tablet, Take 1 tablet (25 mg total) by mouth daily., Disp: 30 tablet, Rfl: 5 .  fexofenadine (ALLEGRA ALLERGY) 180 MG tablet, Take 180 mg by mouth as needed. Reported on 09/29/2015, Disp: , Rfl:  .  fluticasone (FLONASE) 50 MCG/ACT nasal spray, Place 1 spray into both nostrils as needed. , Disp: , Rfl:  .  fluticasone-salmeterol (ADVAIR HFA) 115-21 MCG/ACT inhaler, Inhale 2 puffs into the lungs 2 (two) times daily., Disp: 1 Inhaler, Rfl: 1 .  losartan (COZAAR) 100 MG tablet, Take 1 tablet (100 mg total) by mouth daily., Disp: 90 tablet, Rfl: 3 .  meclizine (ANTIVERT) 25 MG tablet, Take 0.5 tablets (12.5 mg total) by mouth 3 (three) times daily as needed for dizziness., Disp: 30 tablet, Rfl: 0 .  Multiple Vitamins-Minerals (MULTIVITAMIN WOMEN 50+) TABS, Take 2 tablets by mouth daily., Disp: , Rfl:  .  NEXIUM 20 MG capsule, Take 1 capsule (20 mg total) by mouth daily., Disp: 90 capsule,  Rfl: 3 .  predniSONE (STERAPRED UNI-PAK 21 TAB) 10 MG (21) TBPK tablet, Take 1 tablet (10 mg total) by mouth daily. Take 6 tablets by mouth for 1 day followed by  5 tablets by mouth for 1 day followed by  4 tablets by mouth for 1 day followed by  3 tablets by mouth for 1 day followed by  2 tablets by mouth for 1 day followed by  1 tablet by mouth for a day and stop (Patient not taking: Reported on 05/23/2017), Disp: 21 tablet, Rfl:  0  ------------------------------------------------------------------------- Social History   Tobacco Use  . Smoking status: Former Smoker    Packs/day: 2.00    Years: 40.00    Pack years: 80.00    Last attempt to quit: 04/19/1993    Years since quitting: 24.1  . Smokeless tobacco: Never Used  Substance Use Topics  . Alcohol use: No    Alcohol/week: 0.0 oz  . Drug use: No    Review of Systems  Constitutional: Negative for activity change, appetite change, chills, diaphoresis, fatigue and fever.  HENT: Negative for congestion and hearing loss.   Eyes: Negative for visual disturbance.  Respiratory: Negative for apnea, cough (Resolved), choking, chest tightness, shortness of breath and wheezing.   Cardiovascular: Negative for chest pain, palpitations and leg swelling.  Gastrointestinal: Negative for abdominal pain, anal bleeding, blood in stool, constipation, diarrhea, nausea and vomiting.  Genitourinary: Negative for dysuria, frequency and hematuria.       Was asymptomatic before with UTI  Musculoskeletal: Negative for arthralgias and neck pain.  Skin: Negative for rash.  Neurological: Positive for dizziness (Vertigo episodes still, persistent with known inner ear problem). Negative for weakness, light-headedness, numbness and headaches.  Hematological: Negative for adenopathy.  Psychiatric/Behavioral: Negative for behavioral problems, dysphoric mood and sleep disturbance. The patient is not nervous/anxious.    Per HPI unless specifically indicated above     Objective:    BP 137/61   Pulse 96   Temp 97.8 F (36.6 C) (Oral)   Resp 16   Ht 5' 5.5" (1.664 m)   Wt 227 lb (103 kg)   SpO2 98%   BMI 37.20 kg/m   Wt Readings from Last 3 Encounters:  05/23/17 227 lb (103 kg)  05/13/17 230 lb (104.3 kg)  03/20/17 228 lb (103.4 kg)    Physical Exam  Constitutional: She is oriented to person, place, and time. She appears well-developed and well-nourished. No distress.   Well-appearing, comfortable, cooperative  HENT:  Head: Normocephalic and atraumatic.  Mouth/Throat: Oropharynx is clear and moist.  Frontal / maxillary sinuses non-tender. Nares patent without purulence or edema.  Bilateral TM with normal appearance, ear canal normal, no cerumen. No effusion or erythema.  Oropharynx clear without erythema, exudates, edema or asymmetry.  Eyes: Conjunctivae are normal. Right eye exhibits no discharge. Left eye exhibits no discharge.  Neck: Normal range of motion. Neck supple.  Cardiovascular: Normal rate, regular rhythm, normal heart sounds and intact distal pulses.  No murmur heard. Pulmonary/Chest: Effort normal and breath sounds normal. No respiratory distress. She has no wheezes. She has no rales.  Improved, now good air movement, speaks full sentences  Musculoskeletal: Normal range of motion. She exhibits no edema.  Lymphadenopathy:    She has no cervical adenopathy.  Neurological: She is alert and oriented to person, place, and time.  Skin: Skin is warm and dry. No rash noted. She is not diaphoretic. No erythema.  Psychiatric: She has a normal mood and  affect. Her behavior is normal.  Well groomed, good eye contact, normal speech and thoughts  Nursing note and vitals reviewed.    CLINICAL DATA:  Right-sided chest pain, initial encounter  EXAM: CHEST  2 VIEW  COMPARISON:  09/08/2014  FINDINGS: Cardiac shadow is within normal limits. The lungs are well aerated bilaterally. No focal infiltrate or sizable effusion is seen. Mild lingular scarring is again identified and stable. Bilateral shoulder replacements are noted. No acute bony abnormality is seen.  IMPRESSION: No active cardiopulmonary disease.   Electronically Signed   By: Inez Catalina M.D.   On: 05/13/2017 07:44  --------------------------  CLINICAL DATA:  Right-sided chest pain beginning today, worse with inspiration. History of breast cancer.  EXAM: CT  ANGIOGRAPHY CHEST WITH CONTRAST  TECHNIQUE: Multidetector CT imaging of the chest was performed using the standard protocol during bolus administration of intravenous contrast. Multiplanar CT image reconstructions and MIPs were obtained to evaluate the vascular anatomy.  CONTRAST:  67mL ISOVUE-370 IOPAMIDOL (ISOVUE-370) INJECTION 76%  COMPARISON:  Chest radiography same day.  Chest CT 09/08/2014.  FINDINGS: Cardiovascular: Pulmonary arterial opacification is good. There are no pulmonary emboli. There is aortic atherosclerosis. There is atherosclerosis of the brachiocephalic vessels including a 60% stenosis of the proximal left subclavian artery. There is extensive coronary artery calcification. No pericardial fluid.  Mediastinum/Nodes: No mass or lymphadenopathy.  Lungs/Pleura: Few small emphysematous blebs are noted. There is mild atelectasis at the lung bases. One could not exclude some early infiltrate in the right lower lobe laterally. Multiple small nodules at both lung bases are unchanged since 2016. These look like areas of bronchiectasis with mucoid impaction, particularly on the coronal imaging.  Upper Abdomen: Gallstones.  Otherwise negative.  Musculoskeletal: Chronic spinal curvature.  No acute bone finding.  Review of the MIP images confirms the above findings.  IMPRESSION: No pulmonary emboli.  Aortic Atherosclerosis (ICD10-I70.0). Coronary artery calcification. 60% stenosis of the proximal left subclavian artery.  Patchy density in the right lower lobe laterally which could represent mild right base pneumonia. Stable small nodules at both lung bases that appear to represent areas of bronchiectasis with mucoid impaction, similar appearance to the study of 2016.   Electronically Signed   By: Nelson Chimes M.D.   On: 05/13/2017 09:12   Results for orders placed or performed during the hospital encounter of 05/13/17  Urine culture  Result  Value Ref Range   Specimen Description      URINE, RANDOM Performed at [facility], [address] [place], [place] [address]    Special Requests      NONE Performed at [facility], [address] [place], [place] [address]    Culture >=100,000 COLONIES/mL KLEBSIELLA PNEUMONIAE (A)    Report Status 05/16/2017 FINAL    Organism ID, Bacteria KLEBSIELLA PNEUMONIAE (A)       Susceptibility   Klebsiella pneumoniae - MIC*    AMPICILLIN RESISTANT Resistant     CEFAZOLIN <=4 SENSITIVE Sensitive     CEFTRIAXONE <=1 SENSITIVE Sensitive     CIPROFLOXACIN <=0.25 SENSITIVE Sensitive     GENTAMICIN <=1 SENSITIVE Sensitive     IMIPENEM <=0.25 SENSITIVE Sensitive     NITROFURANTOIN 32 SENSITIVE Sensitive     TRIMETH/SULFA <=20 SENSITIVE Sensitive     AMPICILLIN/SULBACTAM 4 SENSITIVE Sensitive     PIP/TAZO <=4 SENSITIVE Sensitive     Extended ESBL NEGATIVE Sensitive     * >=100,000 COLONIES/mL KLEBSIELLA PNEUMONIAE  CBC with Differential/Platelet  Result Value Ref  Range   WBC 9.2 3.6 - 11.0 K/uL   RBC 4.57 3.80 - 5.20 MIL/uL   Hemoglobin 13.6 12.0 - 16.0 g/dL   HCT 40.6 35.0 - 47.0 %   MCV 88.8 80.0 - 100.0 fL   MCH 29.8 26.0 - 34.0 pg   MCHC 33.5 32.0 - 36.0 g/dL   RDW 14.4 11.5 - 14.5 %   Platelets 206 150 - 440 K/uL   Neutrophils Relative % 83 %   Neutro Abs 7.7 (H) 1.4 - 6.5 K/uL   Lymphocytes Relative 9 %   Lymphs Abs 0.8 (L) 1.0 - 3.6 K/uL   Monocytes Relative 5 %   Monocytes Absolute 0.4 0.2 - 0.9 K/uL   Eosinophils Relative 2 %   Eosinophils Absolute 0.2 0 - 0.7 K/uL   Basophils Relative 1 %   Basophils Absolute 0.1 0 - 0.1 K/uL  Comprehensive metabolic panel  Result Value Ref Range   Sodium 139 135 - 145 mmol/L   Potassium 3.1 (L) 3.5 - 5.1 mmol/L   Chloride 100 (L) 101 - 111 mmol/L   CO2 26 22 - 32 mmol/L   Glucose, Bld 154 (H) 65 - 99 mg/dL   BUN 14 6 - 20 mg/dL   Creatinine, Ser 0.75 0.44 - 1.00 mg/dL   Calcium 9.4 8.9 - 10.3 mg/dL   Total  Protein 7.4 6.5 - 8.1 g/dL   Albumin 3.6 3.5 - 5.0 g/dL   AST 25 15 - 41 U/L   ALT 19 14 - 54 U/L   Alkaline Phosphatase 76 38 - 126 U/L   Total Bilirubin 0.9 0.3 - 1.2 mg/dL   GFR calc non Af Amer >60 >60 mL/min   GFR calc Af Amer >60 >60 mL/min   Anion gap 13 5 - 15  Troponin I  Result Value Ref Range   Troponin I <0.03 <0.03 ng/mL  Urinalysis, Routine w reflex microscopic  Result Value Ref Range   Color, Urine YELLOW (A) YELLOW   APPearance HAZY (A) CLEAR   Specific Gravity, Urine >1.046 (H) 1.005 - 1.030   pH 6.0 5.0 - 8.0   Glucose, UA NEGATIVE NEGATIVE mg/dL   Hgb urine dipstick MODERATE (A) NEGATIVE   Bilirubin Urine NEGATIVE NEGATIVE   Ketones, ur NEGATIVE NEGATIVE mg/dL   Protein, ur NEGATIVE NEGATIVE mg/dL   Nitrite NEGATIVE NEGATIVE   Leukocytes, UA MODERATE (A) NEGATIVE   RBC / HPF 6-30 0 - 5 RBC/hpf   WBC, UA TOO NUMEROUS TO COUNT 0 - 5 WBC/hpf   Bacteria, UA MANY (A) NONE SEEN   Squamous Epithelial / LPF 6-30 (A) NONE SEEN   Hyaline Casts, UA PRESENT   Magnesium  Result Value Ref Range   Magnesium 1.7 1.7 - 2.4 mg/dL  Basic metabolic panel  Result Value Ref Range   Sodium 137 135 - 145 mmol/L   Potassium 3.2 (L) 3.5 - 5.1 mmol/L   Chloride 99 (L) 101 - 111 mmol/L   CO2 26 22 - 32 mmol/L   Glucose, Bld 177 (H) 65 - 99 mg/dL   BUN 23 (H) 6 - 20 mg/dL   Creatinine, Ser 0.94 0.44 - 1.00 mg/dL   Calcium 9.0 8.9 - 10.3 mg/dL   GFR calc non Af Amer 56 (L) >60 mL/min   GFR calc Af Amer >60 >60 mL/min   Anion gap 12 5 - 15  CBC  Result Value Ref Range   WBC 9.5 3.6 - 11.0 K/uL   RBC 4.07 3.80 -  5.20 MIL/uL   Hemoglobin 12.3 12.0 - 16.0 g/dL   HCT 36.2 35.0 - 47.0 %   MCV 89.0 80.0 - 100.0 fL   MCH 30.2 26.0 - 34.0 pg   MCHC 33.9 32.0 - 36.0 g/dL   RDW 14.4 11.5 - 14.5 %   Platelets 250 150 - 440 K/uL  Basic metabolic panel  Result Value Ref Range   Sodium 137 135 - 145 mmol/L   Potassium 3.6 3.5 - 5.1 mmol/L   Chloride 100 (L) 101 - 111 mmol/L   CO2  28 22 - 32 mmol/L   Glucose, Bld 172 (H) 65 - 99 mg/dL   BUN 39 (H) 6 - 20 mg/dL   Creatinine, Ser 0.96 0.44 - 1.00 mg/dL   Calcium 9.1 8.9 - 10.3 mg/dL   GFR calc non Af Amer 55 (L) >60 mL/min   GFR calc Af Amer >60 >60 mL/min   Anion gap 9 5 - 15  Magnesium  Result Value Ref Range   Magnesium 2.2 1.7 - 2.4 mg/dL      Assessment & Plan:   Problem List Items Addressed This Visit    RESOLVED: COPD exacerbation (HCC) - Primary    Resolved S/p hospitalization, IV prednisone then oral levaquin / prednisone finished       Other Visit Diagnoses    Pneumonia of right lower lobe due to infectious organism ([place])  - Resolved - Completed antibiotics Levaquin       Vertigo     Continues treatment vestibular rehab per [facility] Dr Tami Ribas    Hypokalemia     Improved on discharge Finish K supplement Re-check BMET 1 week    Relevant Orders   BASIC METABOLIC PANEL WITH GFR       No orders of the defined types were placed in this encounter.   Follow up plan: Return in about 1 week (around 05/30/2017), or if symptoms worsen or fail to improve.   Future order placed for BMET 1 week  Nobie Putnam, [facility] 05/24/2017, 12:35 AM

## 2017-05-24 DIAGNOSIS — R42 Dizziness and giddiness: Secondary | ICD-10-CM | POA: Diagnosis not present

## 2017-05-24 NOTE — Assessment & Plan Note (Signed)
Resolved S/p hospitalization, IV prednisone then oral levaquin / prednisone finished

## 2017-05-31 ENCOUNTER — Other Ambulatory Visit: Payer: Medicare Other

## 2017-05-31 DIAGNOSIS — E876 Hypokalemia: Secondary | ICD-10-CM | POA: Diagnosis not present

## 2017-05-31 LAB — BASIC METABOLIC PANEL WITH GFR
BUN: 15 mg/dL (ref 7–25)
CALCIUM: 9.3 mg/dL (ref 8.6–10.4)
CHLORIDE: 99 mmol/L (ref 98–110)
CO2: 31 mmol/L (ref 20–32)
Creat: 0.8 mg/dL (ref 0.60–0.93)
GFR, Est African American: 81 mL/min/{1.73_m2} (ref >=60)
GFR, Est Non African American: 70 mL/min/{1.73_m2} (ref >=60)
GLUCOSE: 111 mg/dL — AB (ref 65–99)
Potassium: 3.2 mmol/L — ABNORMAL LOW (ref 3.5–5.3)
Sodium: 140 mmol/L (ref 135–146)

## 2017-06-01 ENCOUNTER — Other Ambulatory Visit: Payer: Self-pay | Admitting: Family Medicine

## 2017-06-01 DIAGNOSIS — E876 Hypokalemia: Secondary | ICD-10-CM

## 2017-06-01 MED ORDER — POTASSIUM CHLORIDE ER 20 MEQ PO TBCR: 20.0000 meq | EXTENDED_RELEASE_TABLET | Freq: Every day | ORAL | 2 refills | Status: DC

## 2017-06-02 DIAGNOSIS — R42 Dizziness and giddiness: Secondary | ICD-10-CM | POA: Diagnosis not present

## 2017-06-16 DIAGNOSIS — R42 Dizziness and giddiness: Secondary | ICD-10-CM | POA: Diagnosis not present

## 2017-06-20 DIAGNOSIS — J449 Chronic obstructive pulmonary disease, unspecified: Secondary | ICD-10-CM | POA: Diagnosis not present

## 2017-06-21 ENCOUNTER — Encounter: Payer: Self-pay | Admitting: Family Medicine

## 2017-06-21 ENCOUNTER — Ambulatory Visit (INDEPENDENT_AMBULATORY_CARE_PROVIDER_SITE_OTHER): Payer: Medicare Other | Admitting: Family Medicine

## 2017-06-21 ENCOUNTER — Other Ambulatory Visit: Payer: Self-pay | Admitting: Family Medicine

## 2017-06-21 VITALS — BP 137/69 | HR 89 | Temp 98.0°F | Resp 16 | Ht 65.5 in | Wt 234.0 lb

## 2017-06-21 DIAGNOSIS — R7303 Prediabetes: Secondary | ICD-10-CM

## 2017-06-21 DIAGNOSIS — R0600 Dyspnea, unspecified: Secondary | ICD-10-CM | POA: Diagnosis not present

## 2017-06-21 DIAGNOSIS — J4489 Other specified chronic obstructive pulmonary disease: Secondary | ICD-10-CM

## 2017-06-21 DIAGNOSIS — E876 Hypokalemia: Secondary | ICD-10-CM | POA: Diagnosis not present

## 2017-06-21 DIAGNOSIS — J449 Chronic obstructive pulmonary disease, unspecified: Secondary | ICD-10-CM

## 2017-06-21 DIAGNOSIS — R600 Localized edema: Secondary | ICD-10-CM | POA: Diagnosis not present

## 2017-06-21 DIAGNOSIS — I872 Venous insufficiency (chronic) (peripheral): Secondary | ICD-10-CM

## 2017-06-21 DIAGNOSIS — I10 Essential (primary) hypertension: Secondary | ICD-10-CM | POA: Diagnosis not present

## 2017-06-21 DIAGNOSIS — R011 Cardiac murmur, unspecified: Secondary | ICD-10-CM

## 2017-06-21 DIAGNOSIS — H8192 Unspecified disorder of vestibular function, left ear: Secondary | ICD-10-CM

## 2017-06-21 DIAGNOSIS — N183 Chronic kidney disease, stage 3 unspecified: Secondary | ICD-10-CM

## 2017-06-21 DIAGNOSIS — E782 Mixed hyperlipidemia: Secondary | ICD-10-CM

## 2017-06-21 DIAGNOSIS — E559 Vitamin D deficiency, unspecified: Secondary | ICD-10-CM

## 2017-06-21 DIAGNOSIS — Z0000 Encounter for general adult medical examination without abnormal findings: Secondary | ICD-10-CM

## 2017-06-21 LAB — POCT GLYCOSYLATED HEMOGLOBIN (HGB A1C): Hemoglobin A1C: 5.9 — AB (ref ?–5.7)

## 2017-06-21 MED ORDER — CHLORTHALIDONE 25 MG PO TABS: 25.0000 mg | ORAL_TABLET | Freq: Every day | ORAL | 3 refills | Status: DC

## 2017-06-21 MED ORDER — POTASSIUM CHLORIDE ER 20 MEQ PO TBCR
20.0000 meq | EXTENDED_RELEASE_TABLET | Freq: Every day | ORAL | 3 refills | Status: DC
Start: 2017-06-21 — End: 2019-01-09

## 2017-06-21 NOTE — Assessment & Plan Note (Signed)
Well-controlled Pre-DM with A1c 5.9 (stable from previous 5.8) Concern with obesity, HTN, HLD  Plan:  1. Not on any therapy currently  2. Encourage improved lifestyle - low carb, low sugar diet, reduce portion size, continue improving regular exercise 3. Follow-up q 6 months now - next annual phys + labs

## 2017-06-21 NOTE — Assessment & Plan Note (Signed)
Stable, some chronic persistent dizziness episodic, suspected vertigo Chronic hearing loss L ear, has improved w/ hearing aid Graduated vestibular rehab, has exercises Discontinued Meclizine today, no refill Followed by [facility]

## 2017-06-21 NOTE — Assessment & Plan Note (Signed)
Recurrence now, prior chronic problem Somewhat improved on chlorthalidone Not on Lasix No  Prior ECHO Agree with request of Home Nurse evaluation, may pursue ECHO given some reported dyspnea however thought to be d/t COPD, also with edema and mild heart murmur Ordered ECHO - [facility] to be scheduled -if abnormal can refer to Cardiology

## 2017-06-21 NOTE — Patient Instructions (Addendum)
Thank you for coming to the office today.  1.  Discontinue Meclizine - do not need anymore if not helping  Continue home ENT Vestibular or Balance Exercises - may return to them if worsening  2.  Pre-Diabetes A1c 5.9 - last time 5.8, this is stable  Range is 5.7 to 6.4  No medicine recommended at this time  Will order ECHO cardiogram - and stay tuned for results - likely done at hospital   DUE for FASTING BLOOD WORK (no food or drink after midnight before the lab appointment, only water or coffee without cream/sugar on the morning of)  SCHEDULE "Lab Only" visit in the morning at the clinic for lab draw in 6 MONTHS   - Make sure Lab Only appointment is at about 1 week before your next appointment, so that results will be available  For Lab Results, once available within 2-3 days of blood draw, you can can log in to MyChart online to view your results and a brief explanation. Also, we can discuss results at next follow-up visit.    Please schedule a Follow-up Appointment to: Return in about 6 months (around 12/22/2017) for Annual Physical.    If you have any other questions or concerns, please feel free to call the office or send a message through [facility]. You may also schedule an earlier appointment if necessary.  Additionally, you may be receiving a survey about your experience at our office within a few days to 1 week by e-mail or mail. We value your feedback.  Candace Putnam, DO [place]

## 2017-06-21 NOTE — Progress Notes (Signed)
Subjective:    Patient ID: Candace Tapia, female    DOB: [birth year], 82 y.o.   MRN: [id number]  Candace Tapia is a 82 y.o. female presenting on 06/21/2017 for Hypertension and Diabetes   HPI  FOLLOW-UP Vestibular Problem with Hearing Loss Left - Last visit with me 03/20/17 for this problem, treated with proceed with ENT referral and Vestibular treatments, continued on chlorthalidone no other changes, see prior notes for background information. - Interval update with [facility] in past for Vertigo, and this was determined to not be Meniere's Disease, ultimately she has lost significant hearing in L ear has hearing aid, improving and already graduated vestibular rehab from ENT, she may be able to go as needed or do home exercises - Today patient reports overall much improved. Hearing still poor has hearing aid now L ear. Has episodic dizziness still at times, some worse with position changes, improved on vestibular rehab. - Out of Meclizine asking if she needs to take. States it does not help anymore - Denies active dizziness, lightheadedness, HA, syncope or near syncope, nausea vomiting  Pre-Diabetes Reports no concerns. Last A1c lab 02/2017 with 5.8, stable in past Today reading is A1c 5.9 Meds: Never on meds See below on diet Denies hypoglycemia, polyuria, visual changes, numbness or tingling.  CHRONIC HTN: Reports not checking BP at home. Current Meds - Amlodipine 5mg  daily, Chlorthalidone 25mg  daily, Losartan 100mg  daily - Taking Potassium 67mEq daily while on thiazide Reports good compliance, did not take all of meds today, needs refill chlorthalidone. Tolerating well, w/o complaints. Lifestyle: - Diet: admits she needs to drink more water, working on this - Exercise: walking some, but less active unable to drive, now lives at home Admits edema seems stable or improved Denies CP, HA, lightheadedness  COPD / Dyspnea / Leg Edema h/o venous insufficiency She had home  evaluation by nurse through insurance, and they recommended an ECHOcardiogram due to history of leg swelling, dyspnea, and some concern for possible heart murmur. She has not had ECHO before. She has family history of other family members dx with CHF. She has not seen Cardiology - She is followed by [facility] for COPD, she thinks some of her dyspnea at baseline is due to this - Recent COPD exac seen by me 05/23/17, finished prednisone taper    Depression screen [facility] 2/9 06/21/2017 12/06/2016 08/12/2016  Decreased Interest 0 0 1  Down, Depressed, Hopeless 0 0 1  PHQ - 2 Score 0 0 2  Altered sleeping - - 0  Tired, decreased energy - - 2  Change in appetite - - 0  Feeling bad or failure about yourself  - - 0  Trouble concentrating - - 3  Moving slowly or fidgety/restless - - 0  Suicidal thoughts - - 0  PHQ-9 Score - - 7  Difficult doing work/chores - - Not difficult at all    Social History   Tobacco Use  . Smoking status: Former Smoker    Packs/day: 2.00    Years: 40.00    Pack years: 80.00    Last attempt to quit: 04/19/1993    Years since quitting: 24.1  . Smokeless tobacco: Never Used  Substance Use Topics  . Alcohol use: No    Alcohol/week: 0.0 oz  . Drug use: No    Review of Systems Per HPI unless specifically indicated above     Objective:    BP 137/69   Pulse 89   Temp 98 F (  36.7 C)   Resp 16   Ht 5' 5.5" (1.664 m)   Wt 234 lb (106.1 kg)   BMI 38.35 kg/m   Wt Readings from Last 3 Encounters:  06/21/17 234 lb (106.1 kg)  05/23/17 227 lb (103 kg)  05/13/17 230 lb (104.3 kg)    Physical Exam  Constitutional: She is oriented to person, place, and time. She appears well-developed and well-nourished. No distress.  Well-appearing, comfortable, cooperative, obese  HENT:  Head: Normocephalic and atraumatic.  Mouth/Throat: Oropharynx is clear and moist.  Frontal / maxillary sinuses non-tender. Nares patent without purulence or edema.  Bilateral TM with  normal appearance, ear canal normal, no cerumen. No effusion or erythema.  Oropharynx clear without erythema, exudates, edema or asymmetry.  Eyes: Conjunctivae are normal. Right eye exhibits no discharge. Left eye exhibits no discharge.  Neck: Normal range of motion. Neck supple.  Cardiovascular: Normal rate, regular rhythm and intact distal pulses.  Murmur (5-9/5 systolic murmur, mild) heard. Pulmonary/Chest: Effort normal and breath sounds normal. No respiratory distress. She has no wheezes. She has no rales.  good air movement, speaks full sentences. No focal crackles  Musculoskeletal: Normal range of motion. She exhibits edema (Significant +2 pitting edema bilateral ankles L>R).  Lymphadenopathy:    She has no cervical adenopathy.  Neurological: She is alert and oriented to person, place, and time.  Skin: Skin is warm and dry. No rash noted. She is not diaphoretic. No erythema.  Psychiatric: She has a normal mood and affect. Her behavior is normal.  Well groomed, good eye contact, normal speech and thoughts  Nursing note and vitals reviewed.  Results for orders placed or performed in visit on 06/21/17  POCT HgB A1C  Result Value Ref Range   Hemoglobin A1C 5.9 (A) 5.7   Recent Labs    12/05/16 1207 03/16/17 0849 06/21/17 1357  HGBA1C 5.8* 5.8* 5.9*       Assessment & Plan:   Problem List Items Addressed This Visit    Bilateral lower extremity edema    Recurrence now, prior chronic problem Somewhat improved on chlorthalidone Not on Lasix No  Prior ECHO Agree with request of Home Nurse evaluation, may pursue ECHO given some reported dyspnea however thought to be d/t COPD, also with edema and mild heart murmur Ordered ECHO - [facility] to be scheduled -if abnormal can refer to Cardiology      Relevant Orders   ECHOCARDIOGRAM COMPLETE   Chronic venous insufficiency    Recent worse, limited improve on chlorthalidone Chronic problem, LE venous insufficiency with known venous  reflux Followed by Vascular Surgery Dr Lucky Cowboy  Plan: 1. Recommend again to maximize conservative therapy - try to improve elevation RICE therapy, improve hydration 2. Already on thiazide, caution any further effect on kidneys with CKD 3. Avoid loop diuretics at this time with CKD 4. Ordered ECHOcardiogram for further eval - to be scheduled -Follow-up        Relevant Medications   chlorthalidone (HYGROTON) 25 MG tablet   Hypertension    Controlled HTN - Home BP readings improved Complication with CKD-II to III, some improved    Plan:  1. Continue current BP regimen Losartan 100mg  daily, Chlorthalidone 25mg  daily, Amlodipine 5mg  2. Encourage improved lifestyle - low sodium diet, regular exercise 3. Continue monitor BP outside office, bring readings to next visit, if persistently >140/90 or new symptoms notify office sooner 4. Follow-up q 69mo      Relevant Medications   chlorthalidone (HYGROTON) 25 MG  tablet   Pre-diabetes - Primary    Well-controlled Pre-DM with A1c 5.9 (stable from previous 5.8) Concern with obesity, HTN, HLD  Plan:  1. Not on any therapy currently  2. Encourage improved lifestyle - low carb, low sugar diet, reduce portion size, continue improving regular exercise 3. Follow-up q 6 months now - next annual phys + labs      Relevant Orders   POCT HgB A1C (Completed)   Vestibular disorder    Stable, some chronic persistent dizziness episodic, suspected vertigo Chronic hearing loss L ear, has improved w/ hearing aid Graduated vestibular rehab, has exercises Discontinued Meclizine today, no refill Followed by [facility]       Other Visit Diagnoses    Hypokalemia       Continue K 60mEq daily while on thiazide   Relevant Medications   Potassium Chloride ER 20 MEQ TBCR   Dyspnea, unspecified type       Presumed secondary to COPD, however with swelling will proceed with ECHO   Relevant Orders   ECHOCARDIOGRAM COMPLETE   Heart murmur       Relevant  Orders   ECHOCARDIOGRAM COMPLETE      Meds ordered this encounter  Medications  . chlorthalidone (HYGROTON) 25 MG tablet    Sig: Take 1 tablet (25 mg total) by mouth daily.    Dispense:  90 tablet    Refill:  3  . Potassium Chloride ER 20 MEQ TBCR    Sig: Take 20 mEq by mouth daily.    Dispense:  90 tablet    Refill:  3   Orders Placed This Encounter  Procedures  . POCT HgB A1C  . ECHOCARDIOGRAM COMPLETE    Standing Status:   Future    Standing Expiration Date:   09/22/2018    Order Specific Question:   Where should this test be performed    Answer:   [facility]    Order Specific Question:   Please indicate who you request to read the echo results.    Answer:   [facility]    Order Specific Question:   Perflutren DEFINITY (image enhancing agent) should be administered unless hypersensitivity or allergy exist    Answer:   Administer Perflutren    Order Specific Question:   Expected Date:    Answer:   1 month     Follow up plan: Return in about 6 months (around 12/22/2017) for Annual Physical.  Future labs ordered for 12/22/17  Nobie Putnam, [facility] 06/21/2017, 11:25 PM

## 2017-06-21 NOTE — Assessment & Plan Note (Signed)
Recent worse, limited improve on chlorthalidone Chronic problem, LE venous insufficiency with known venous reflux Followed by Vascular Surgery Dr Lucky Cowboy  Plan: 1. Recommend again to maximize conservative therapy - try to improve elevation RICE therapy, improve hydration 2. Already on thiazide, caution any further effect on kidneys with CKD 3. Avoid loop diuretics at this time with CKD 4. Ordered ECHOcardiogram for further eval - to be scheduled -Follow-up

## 2017-06-21 NOTE — Assessment & Plan Note (Signed)
Controlled HTN - Home BP readings improved Complication with CKD-II to III, some improved    Plan:  1. Continue current BP regimen Losartan 100mg  daily, Chlorthalidone 25mg  daily, Amlodipine 5mg  2. Encourage improved lifestyle - low sodium diet, regular exercise 3. Continue monitor BP outside office, bring readings to next visit, if persistently >140/90 or new symptoms notify office sooner 4. Follow-up q 39mo

## 2017-06-27 ENCOUNTER — Ambulatory Visit
Admission: RE | Admit: 2017-06-27 | Discharge: 2017-06-27 | Disposition: A | Discharge status: Home / Self Care | Payer: Medicare Other | Source: Ambulatory Visit | Attending: Family Medicine | Admitting: Family Medicine

## 2017-06-27 DIAGNOSIS — R011 Cardiac murmur, unspecified: Secondary | ICD-10-CM | POA: Diagnosis not present

## 2017-06-27 DIAGNOSIS — K219 Gastro-esophageal reflux disease without esophagitis: Secondary | ICD-10-CM | POA: Insufficient documentation

## 2017-06-27 DIAGNOSIS — R600 Localized edema: Secondary | ICD-10-CM | POA: Diagnosis not present

## 2017-06-27 DIAGNOSIS — R0600 Dyspnea, unspecified: Secondary | ICD-10-CM | POA: Diagnosis not present

## 2017-06-27 DIAGNOSIS — J449 Chronic obstructive pulmonary disease, unspecified: Secondary | ICD-10-CM | POA: Diagnosis not present

## 2017-06-27 DIAGNOSIS — I119 Hypertensive heart disease without heart failure: Secondary | ICD-10-CM | POA: Diagnosis not present

## 2017-06-27 DIAGNOSIS — Z853 Personal history of malignant neoplasm of breast: Secondary | ICD-10-CM | POA: Insufficient documentation

## 2017-06-27 NOTE — Progress Notes (Signed)
*  PRELIMINARY RESULTS* Echocardiogram 2D Echocardiogram has been performed.  Candace Tapia 06/27/2017, 10:34 AM

## 2017-07-04 DIAGNOSIS — R0609 Other forms of dyspnea: Secondary | ICD-10-CM | POA: Diagnosis not present

## 2017-07-04 DIAGNOSIS — J449 Chronic obstructive pulmonary disease, unspecified: Secondary | ICD-10-CM | POA: Diagnosis not present

## 2017-07-04 DIAGNOSIS — G4734 Idiopathic sleep related nonobstructive alveolar hypoventilation: Secondary | ICD-10-CM | POA: Diagnosis not present

## 2017-07-21 DIAGNOSIS — J449 Chronic obstructive pulmonary disease, unspecified: Secondary | ICD-10-CM | POA: Diagnosis not present

## 2017-07-31 ENCOUNTER — Other Ambulatory Visit: Payer: Self-pay | Admitting: Family Medicine

## 2017-07-31 DIAGNOSIS — I10 Essential (primary) hypertension: Secondary | ICD-10-CM

## 2017-07-31 MED ORDER — LOSARTAN POTASSIUM 100 MG PO TABS: 100.0000 mg | ORAL_TABLET | Freq: Every day | ORAL | 3 refills | Status: DC

## 2017-07-31 MED ORDER — AMLODIPINE BESYLATE 5 MG PO TABS: 5.0000 mg | ORAL_TABLET | Freq: Every day | ORAL | 3 refills | Status: DC

## 2017-07-31 NOTE — Telephone Encounter (Signed)
Pt. Called requesting  Refill on   Amlodipine 5 mg ,Losartan  100 mg,  Candace Tapia

## 2017-08-20 DIAGNOSIS — J449 Chronic obstructive pulmonary disease, unspecified: Secondary | ICD-10-CM | POA: Diagnosis not present

## 2017-09-20 DIAGNOSIS — J449 Chronic obstructive pulmonary disease, unspecified: Secondary | ICD-10-CM | POA: Diagnosis not present

## 2017-10-20 DIAGNOSIS — J449 Chronic obstructive pulmonary disease, unspecified: Secondary | ICD-10-CM | POA: Diagnosis not present

## 2017-11-20 DIAGNOSIS — J449 Chronic obstructive pulmonary disease, unspecified: Secondary | ICD-10-CM | POA: Diagnosis not present

## 2017-12-05 DIAGNOSIS — J449 Chronic obstructive pulmonary disease, unspecified: Secondary | ICD-10-CM | POA: Diagnosis not present

## 2017-12-05 DIAGNOSIS — G4734 Idiopathic sleep related nonobstructive alveolar hypoventilation: Secondary | ICD-10-CM | POA: Diagnosis not present

## 2017-12-05 DIAGNOSIS — R0609 Other forms of dyspnea: Secondary | ICD-10-CM | POA: Diagnosis not present

## 2017-12-12 ENCOUNTER — Other Ambulatory Visit: Payer: Medicare Other

## 2017-12-12 ENCOUNTER — Ambulatory Visit: Payer: Medicare Other

## 2017-12-21 DIAGNOSIS — J449 Chronic obstructive pulmonary disease, unspecified: Secondary | ICD-10-CM | POA: Diagnosis not present

## 2017-12-22 ENCOUNTER — Other Ambulatory Visit: Payer: Medicare Other

## 2017-12-22 DIAGNOSIS — R7303 Prediabetes: Secondary | ICD-10-CM

## 2017-12-22 DIAGNOSIS — Z0000 Encounter for general adult medical examination without abnormal findings: Secondary | ICD-10-CM

## 2017-12-22 DIAGNOSIS — N183 Chronic kidney disease, stage 3 unspecified: Secondary | ICD-10-CM

## 2017-12-22 DIAGNOSIS — E782 Mixed hyperlipidemia: Secondary | ICD-10-CM | POA: Diagnosis not present

## 2017-12-22 DIAGNOSIS — I10 Essential (primary) hypertension: Secondary | ICD-10-CM | POA: Diagnosis not present

## 2017-12-23 LAB — TSH: TSH: 2.48 m[IU]/L (ref 0.40–4.50)

## 2017-12-23 LAB — CBC WITH DIFFERENTIAL/PLATELET
BASOS ABS: 52 {cells}/uL (ref 0–200)
BASOS PCT: 0.7 %
EOS PCT: 1.9 %
Eosinophils Absolute: 141 cells/uL (ref 15–500)
HEMATOCRIT: 40.1 % (ref 35.0–45.0)
HEMOGLOBIN: 13.2 g/dL (ref 11.7–15.5)
LYMPHS ABS: 1495 {cells}/uL (ref 850–3900)
MCH: 30 pg (ref 27.0–33.0)
MCHC: 32.9 g/dL (ref 32.0–36.0)
MCV: 91.1 fL (ref 80.0–100.0)
MPV: 11.2 fL (ref 7.5–12.5)
Monocytes Relative: 7 %
NEUTROS ABS: 5195 {cells}/uL (ref 1500–7800)
Neutrophils Relative %: 70.2 %
Platelets: 270 10*3/uL (ref 140–400)
RBC: 4.4 10*6/uL (ref 3.80–5.10)
RDW: 13.5 % (ref 11.0–15.0)
Total Lymphocyte: 20.2 %
WBC mixed population: 518 cells/uL (ref 200–950)
WBC: 7.4 10*3/uL (ref 3.8–10.8)

## 2017-12-23 LAB — COMPLETE METABOLIC PANEL WITH GFR
AG Ratio: 1.6 (calc) (ref 1.0–2.5)
ALBUMIN MSPROF: 3.9 g/dL (ref 3.6–5.1)
ALKALINE PHOSPHATASE (APISO): 71 U/L (ref 33–130)
ALT: 16 U/L (ref 6–29)
AST: 15 U/L (ref 10–35)
BILIRUBIN TOTAL: 0.3 mg/dL (ref 0.2–1.2)
BUN: 15 mg/dL (ref 7–25)
CO2: 30 mmol/L (ref 20–32)
CREATININE: 0.86 mg/dL (ref 0.60–0.88)
Calcium: 9.3 mg/dL (ref 8.6–10.4)
Chloride: 99 mmol/L (ref 98–110)
GFR, EST AFRICAN AMERICAN: 74 mL/min/{1.73_m2} (ref >=60)
GFR, Est Non African American: 64 mL/min/{1.73_m2} (ref >=60)
Globulin: 2.4 g/dL (calc) (ref 1.9–3.7)
Glucose, Bld: 92 mg/dL (ref 65–99)
Potassium: 3.7 mmol/L (ref 3.5–5.3)
Sodium: 140 mmol/L (ref 135–146)
Total Protein: 6.3 g/dL (ref 6.1–8.1)

## 2017-12-23 LAB — LIPID PANEL
CHOL/HDL RATIO: 4.6 (calc) (ref <5.0)
Cholesterol: 211 mg/dL — ABNORMAL HIGH (ref <200)
HDL: 46 mg/dL — ABNORMAL LOW (ref >50)
LDL CHOLESTEROL (CALC): 134 mg/dL — AB
Non-HDL Cholesterol (Calc): 165 mg/dL (calc) — ABNORMAL HIGH (ref <130)
TRIGLYCERIDES: 178 mg/dL — AB (ref <150)

## 2017-12-23 LAB — HEMOGLOBIN A1C
EAG (MMOL/L): 6.5 (calc)
Hgb A1c MFr Bld: 5.7 % of total Hgb — ABNORMAL HIGH (ref <5.7)
MEAN PLASMA GLUCOSE: 117 (calc)

## 2017-12-26 ENCOUNTER — Encounter: Payer: Self-pay | Admitting: Family Medicine

## 2017-12-26 ENCOUNTER — Ambulatory Visit (INDEPENDENT_AMBULATORY_CARE_PROVIDER_SITE_OTHER): Payer: Medicare Other

## 2017-12-26 ENCOUNTER — Ambulatory Visit (INDEPENDENT_AMBULATORY_CARE_PROVIDER_SITE_OTHER): Payer: Medicare Other | Admitting: Family Medicine

## 2017-12-26 VITALS — BP 155/55 | HR 76 | Temp 98.1°F | Resp 16 | Ht 65.5 in | Wt 215.0 lb

## 2017-12-26 DIAGNOSIS — T466X5A Adverse effect of antihyperlipidemic and antiarteriosclerotic drugs, initial encounter: Secondary | ICD-10-CM

## 2017-12-26 DIAGNOSIS — M791 Myalgia, unspecified site: Secondary | ICD-10-CM

## 2017-12-26 DIAGNOSIS — N183 Chronic kidney disease, stage 3 unspecified: Secondary | ICD-10-CM

## 2017-12-26 DIAGNOSIS — J449 Chronic obstructive pulmonary disease, unspecified: Secondary | ICD-10-CM | POA: Diagnosis not present

## 2017-12-26 DIAGNOSIS — Z0000 Encounter for general adult medical examination without abnormal findings: Secondary | ICD-10-CM | POA: Diagnosis not present

## 2017-12-26 DIAGNOSIS — H6591 Unspecified nonsuppurative otitis media, right ear: Secondary | ICD-10-CM

## 2017-12-26 DIAGNOSIS — F3342 Major depressive disorder, recurrent, in full remission: Secondary | ICD-10-CM

## 2017-12-26 DIAGNOSIS — I872 Venous insufficiency (chronic) (peripheral): Secondary | ICD-10-CM

## 2017-12-26 DIAGNOSIS — H9201 Otalgia, right ear: Secondary | ICD-10-CM

## 2017-12-26 DIAGNOSIS — I10 Essential (primary) hypertension: Secondary | ICD-10-CM

## 2017-12-26 DIAGNOSIS — R7303 Prediabetes: Secondary | ICD-10-CM

## 2017-12-26 DIAGNOSIS — G894 Chronic pain syndrome: Secondary | ICD-10-CM

## 2017-12-26 DIAGNOSIS — E782 Mixed hyperlipidemia: Secondary | ICD-10-CM

## 2017-12-26 DIAGNOSIS — G629 Polyneuropathy, unspecified: Secondary | ICD-10-CM

## 2017-12-26 DIAGNOSIS — Z853 Personal history of malignant neoplasm of breast: Secondary | ICD-10-CM

## 2017-12-26 DIAGNOSIS — J4489 Other specified chronic obstructive pulmonary disease: Secondary | ICD-10-CM

## 2017-12-26 MED ORDER — GABAPENTIN 100 MG PO CAPS: ORAL_CAPSULE | ORAL | 1 refills | Status: DC

## 2017-12-26 NOTE — Progress Notes (Signed)
Subjective:   Candace Tapia is a 82 y.o. female who presents for Medicare Annual (Subsequent) preventive examination.  Review of Systems:   Cardiac Risk Factors include: hypertension;advanced age (>70men, >99 women);obesity (BMI >30kg/m2)     Objective:     Vitals: BP (!) 155/55 (BP Location: Right Arm, Patient Position: Sitting)   Pulse 76   Temp 98.1 F (36.7 C) (Oral)   Resp 16   Ht 5' 5.5" (1.664 m)   Wt 215 lb (97.5 kg)   BMI 35.23 kg/m   Body mass index is 35.23 kg/m.  Advanced Directives 12/26/2017 05/13/2017 05/13/2017 02/26/2017 12/06/2016 11/25/2016 10/27/2016  Does Patient Have a Medical Advance Directive? Yes Yes Yes No No No No  Type of Paramedic of [place];Living will [place]  Does patient want to make changes to medical advance directive? - No - Patient declined No - Patient declined - - - -  Copy of [place] in Chart? No - copy requested No - copy requested - - - - -  Would patient like information on creating a medical advance directive? - No - Patient declined No - Patient declined Yes (ED - Information included in AVS) Yes (MAU/Ambulatory/Procedural Areas - Information given) - -    Tobacco Social History   Tobacco Use  Smoking Status Former Smoker  . Packs/day: 2.00  . Years: 40.00  . Pack years: 80.00  . Last attempt to quit: 04/19/1993  . Years since quitting: 24.7  Smokeless Tobacco Never Used     Counseling given: Not Answered   Clinical Intake:  Pre-visit preparation completed: Yes  Pain : No/denies pain     Nutritional Status: BMI > 30  Obese Nutritional Risks: None Diabetes: No  How often do you need to have someone help you when you read instructions, pamphlets, or other written materials from your doctor or pharmacy?: 1 - Never What is the last grade level you completed in school?: 8th grade  Interpreter Needed?: No  Information entered by :: Tyrian Peart,LPN   Past Medical History:  Diagnosis Date  . Allergy   . Arthritis   . Breast cancer ([place]) 2014   radiation and taking exemestane  . Cancer of left breast ([place]) 10/27/2014  . COPD (chronic obstructive pulmonary disease) ([place])   . GERD (gastroesophageal reflux disease)   . H/O: hysterectomy   . History of lumpectomy 2014   LT breast  . Hypertension    Past Surgical History:  Procedure Laterality Date  . ABDOMINAL HYSTERECTOMY    . BREAST LUMPECTOMY Left 2014  . BREAST SURGERY Left 2014  . JOINT REPLACEMENT    . LYMPH NODE DISSECTION     neck  . TOTAL SHOULDER REPLACEMENT Bilateral 2010 and 2013   Family History  Problem Relation Age of Onset  . Breast cancer Mother 25  . Diabetes Mother   . Kidney disease Father   . Diabetes Father   . Breast cancer Sister 73   Social History   Socioeconomic History  . Marital status: Widowed    Spouse name: Not on file  . Number of children: Not on file  . Years of education: Not on file  . Highest education level: 8th grade  Occupational History  . Not on file  Social Needs  . Financial resource strain: Not hard at all  . Food insecurity:    Worry: Never true    Inability:  Never true  . Transportation needs:    Medical: No    Non-medical: No  Tobacco Use  . Smoking status: Former Smoker    Packs/day: 2.00    Years: 40.00    Pack years: 80.00    Last attempt to quit: 04/19/1993    Years since quitting: 24.7  . Smokeless tobacco: Never Used  Substance and Sexual Activity  . Alcohol use: No    Alcohol/week: 0.0 standard drinks  . Drug use: No  . Sexual activity: Never  Lifestyle  . Physical activity:    Days per week: 0 days    Minutes per session: 0 min  . Stress: Not at all  Relationships  . Social connections:    Talks on phone: Once a week    Gets together: More than three times a week    Attends religious service: Never    Active member of club or organization: No    Attends meetings of clubs or  organizations: Never    Relationship status: Widowed  Other Topics Concern  . Not on file  Social History Narrative  . Not on file    Outpatient Encounter Medications as of 12/26/2017  Medication Sig  . acetaminophen (TYLENOL) 650 MG CR tablet Take 650 mg by mouth every 8 (eight) hours as needed for pain.  Marland Kitchen albuterol (PROAIR HFA) 108 (90 BASE) MCG/ACT inhaler Inhale 1 puff into the lungs every 6 (six) hours as needed.   Marland Kitchen amLODipine (NORVASC) 5 MG tablet Take 1 tablet (5 mg total) by mouth daily.  . chlorthalidone (HYGROTON) 25 MG tablet Take 1 tablet (25 mg total) by mouth daily.  . fexofenadine (ALLEGRA ALLERGY) 180 MG tablet Take 180 mg by mouth as needed. Reported on 09/29/2015  . fluticasone (FLONASE) 50 MCG/ACT nasal spray Place 1 spray into both nostrils as needed.   . fluticasone-salmeterol (ADVAIR HFA) 115-21 MCG/ACT inhaler Inhale 2 puffs into the lungs 2 (two) times daily.  Marland Kitchen gabapentin (NEURONTIN) 100 MG capsule Start 1 capsule daily, increase by 1 cap every 2-3 days as tolerated up to 3 times a day, or may take 3 at once in evening.  Marland Kitchen losartan (COZAAR) 100 MG tablet Take 1 tablet (100 mg total) by mouth daily.  . meclizine (ANTIVERT) 25 MG tablet Take 0.5 tablets (12.5 mg total) by mouth 3 (three) times daily as needed for dizziness.  . Multiple Vitamins-Minerals (MULTIVITAMIN WOMEN 50+) TABS Take 2 tablets by mouth daily.  Marland Kitchen NEXIUM 20 MG capsule Take 1 capsule (20 mg total) by mouth daily.  . Potassium Chloride ER 20 MEQ TBCR Take 20 mEq by mouth daily.   No facility-administered encounter medications on file as of 12/26/2017.     Activities of Daily Living In your present state of health, do you have any difficulty performing the following activities: 12/26/2017 05/13/2017  Hearing? Y Y  Comment has bilateral hearing aids  -  Vision? N N  Difficulty concentrating or making decisions? Y N  Walking or climbing stairs? Y Y  Dressing or bathing? N N  Doing errands,  shopping? N Y  Conservation officer, nature and eating ? N -  Using the Toilet? N -  In the past six months, have you accidently leaked urine? N -  Do you have problems with loss of bowel control? N -  Managing your Medications? N -  Managing your Finances? N -  Housekeeping or managing your Housekeeping? N -  Some recent data might be hidden  Patient Care Team: Olin Hauser, DO as PCP - General (Family Medicine) Erby Pian, MD as Referring Physician (Specialist) Lequita Asal, MD as Referring Physician (Hematology and Oncology)    Assessment:   This is a routine wellness examination for Deshawnda.  Exercise Activities and Dietary recommendations Current Exercise Habits: The patient does not participate in regular exercise at present, Exercise limited by: None identified  Goals    . Increase water intake     Recommend drinking at least 6-8 glasses of water a day       Fall Risk Fall Risk  12/26/2017 06/21/2017 12/06/2016 02/08/2016 12/28/2015  Falls in the past year? No No No No No  Comment - - - - -   Is the patient's home free of loose throw rugs in walkways, pet beds, electrical cords, etc?   yes      Grab bars in the bathroom? yes      Handrails on the stairs?   no stairs       Adequate lighting?   yes  Timed Get Up and Go performed: Completed in 10 seconds with use of assistive devices-cane , steady gait. No intervention needed at this time.   Depression Screen PHQ 2/9 Scores 12/26/2017 06/21/2017 12/06/2016 08/12/2016  PHQ - 2 Score 0 0 0 2  PHQ- 9 Score - - - 7  Exception Documentation - - - -     Cognitive Function     6CIT Screen 12/26/2017 12/06/2016  What Year? 0 points 0 points  What month? 0 points 0 points  What time? 0 points 0 points  Count back from 20 0 points 0 points  Months in reverse 0 points 0 points  Repeat phrase 2 points 0 points  Total Score 2 0    Immunization History  Administered Date(s) Administered  . Influenza, High Dose  Seasonal PF 02/24/2015, 02/09/2016, 12/12/2016  . Influenza-Unspecified 01/16/2014  . Pneumococcal Conjugate-13 08/29/2014  . Pneumococcal Polysaccharide-23 04/19/2007  . Tdap 01/17/2013  . Zoster 08/29/2014    Qualifies for Shingles Vaccine? Yes, discussed shingrix vaccine   Screening Tests Health Maintenance  Topic Date Due  . INFLUENZA VACCINE  11/16/2017  . TETANUS/TDAP  01/18/2023  . DEXA SCAN  Completed  . PNA vac Low Risk Adult  Completed   Patient requested flu vaccine in December when she comes for a follow up.    Cancer Screenings: Lung: Low Dose CT Chest recommended if Age 57-80 years, 30 pack-year currently smoking OR have quit w/in 15years. Patient does not qualify. Breast:  Up to date on Mammogram? Yes  10/03/2016 Up to date of Bone Density/Dexa? Yes 10/03/2016 Colorectal: no longer required  Additional Screenings:  Hepatitis C Screening: not indicated      Plan:    I have personally reviewed and addressed the Medicare Annual Wellness questionnaire and have noted the following in the patient's chart:  A. Medical and social history B. Use of alcohol, tobacco or illicit drugs  C. Current medications and supplements D. Functional ability and status E.  Nutritional status F.  Physical activity G. Advance directives H. List of other physicians I.  Hospitalizations, surgeries, and ER visits in previous 12 months [facility]  [place] such as hearing and vision if needed, cognitive and depression L. Referrals and appointments   In addition, I have reviewed and discussed with patient certain preventive protocols, quality metrics, and best practice recommendations. A written personalized care plan for preventive services as well as  general preventive health recommendations were provided to patient.   Signed,  Tyler Aas, LPN Nurse Health Advisor   Nurse Notes:none

## 2017-12-26 NOTE — Progress Notes (Signed)
Subjective:    Patient ID: Candace Tapia, female    DOB: [birth year], 82 y.o.   MRN: [id number]  Candace Tapia is a 82 y.o. female presenting on 12/26/2017 for Annual Exam   HPI   Here for Annual Physical and Lab Review.  FOLLOW-UP Vestibular Problem with Hearing Loss, R Ear Pain Background history, previous problem with vertigo / inner ear dysfunction, ultimately hearing loss, followed by [facility] ENT, primarily reduced hearing in L ear. Had partial loss in R ear. - Today she reports some episodic pain in her R ear - Taking Meclizine PRN for vertigo. Taking diuretic - Denies other changes in hearing since last visit, has not returned to [place] / Peripheral Neuropathy Prior A1c 5.8 to 5.9. Now recent lab shows A1c 5.7 Meds:Never on meds See below on diet Admits edema in lower extremity, tingling and burning in both feet still. Never on medicine for neuropathy before.  HYPERLIPIDEMIA: Last lipid panel 12/2017, elevated LDL, TG Cannot take statin due to myalgia. Failed prior statin, unsure.  CHRONIC HTN: Reports not checking BP at home. Current Meds - Amlodipine 5mg  daily, Chlorthalidone 25mg  daily, Losartan 100mg  daily - Taking Potassium 24mEq daily while on thiazide Admits edema seems stable or improved  COPD / Leg Edema h/o venous insufficiency No recent flares. Followed by [facility] Pulmonology.  Chronic Pain Syndrome / OA DJD multiple joints She has decided to not return to [facility].  Breast Cancer, L Previously followed by Oncology Dr Mike Gip Off treatment now.  Major Depression, recurrent, in remission Denies any complaints. Not on anti depressant currently.  Health Maintenance: Due for Flu shot, will return when in stock.  Depression screen [facility] 2/9 12/26/2017 12/26/2017 06/21/2017  Decreased Interest 0 0 0  Down, Depressed, Hopeless 0 0 0  PHQ - 2 Score 0 0 0  Altered sleeping 0 - -  Tired, decreased energy 0 - -  Change in  appetite 0 - -  Feeling bad or failure about yourself  0 - -  Trouble concentrating 0 - -  Moving slowly or fidgety/restless 0 - -  Suicidal thoughts 0 - -  PHQ-9 Score 0 - -  Difficult doing work/chores Not difficult at all - -    Past Medical History:  Diagnosis Date  . Allergy   . Arthritis   . Breast cancer ([place]) 2014   radiation and taking exemestane  . Cancer of left breast ([place]) 10/27/2014  . COPD (chronic obstructive pulmonary disease) ([place])   . GERD (gastroesophageal reflux disease)   . H/O: hysterectomy   . History of lumpectomy 2014   LT breast   Past Surgical History:  Procedure Laterality Date  . ABDOMINAL HYSTERECTOMY    . BREAST LUMPECTOMY Left 2014  . BREAST SURGERY Left 2014  . JOINT REPLACEMENT    . LYMPH NODE DISSECTION     neck  . TOTAL SHOULDER REPLACEMENT Bilateral 2010 and 2013   Social History   Socioeconomic History  . Marital status: Widowed    Spouse name: Not on file  . Number of children: Not on file  . Years of education: Not on file  . Highest education level: 8th grade  Occupational History  . Not on file  Social Needs  . Financial resource strain: Not hard at all  . Food insecurity:    Worry: Never true    Inability: Never true  . Transportation needs:    Medical: No    Non-medical:  No  Tobacco Use  . Smoking status: Former Smoker    Packs/day: 2.00    Years: 40.00    Pack years: 80.00    Last attempt to quit: 04/19/1993    Years since quitting: 24.7  . Smokeless tobacco: Never Used  Substance and Sexual Activity  . Alcohol use: No    Alcohol/week: 0.0 standard drinks  . Drug use: No  . Sexual activity: Never  Lifestyle  . Physical activity:    Days per week: 0 days    Minutes per session: 0 min  . Stress: Not at all  Relationships  . Social connections:    Talks on phone: Once a week    Gets together: More than three times a week    Attends religious service: Never    Active member of club or organization: No     Attends meetings of clubs or organizations: Never    Relationship status: Widowed  . Intimate partner violence:    Fear of current or ex partner: No    Emotionally abused: No    Physically abused: No    Forced sexual activity: No  Other Topics Concern  . Not on file  Social History Narrative  . Not on file   Family History  Problem Relation Age of Onset  . Breast cancer Mother 22  . Diabetes Mother   . Kidney disease Father   . Diabetes Father   . Breast cancer Sister 76   Current Outpatient Medications on File Prior to Visit  Medication Sig  . acetaminophen (TYLENOL) 650 MG CR tablet Take 650 mg by mouth every 8 (eight) hours as needed for pain.  Marland Kitchen albuterol (PROAIR HFA) 108 (90 BASE) MCG/ACT inhaler Inhale 1 puff into the lungs every 6 (six) hours as needed.   Marland Kitchen amLODipine (NORVASC) 5 MG tablet Take 1 tablet (5 mg total) by mouth daily.  . chlorthalidone (HYGROTON) 25 MG tablet Take 1 tablet (25 mg total) by mouth daily.  . fluticasone (FLONASE) 50 MCG/ACT nasal spray Place 1 spray into both nostrils as needed.   . fluticasone-salmeterol (ADVAIR HFA) 115-21 MCG/ACT inhaler Inhale 2 puffs into the lungs 2 (two) times daily.  Marland Kitchen losartan (COZAAR) 100 MG tablet Take 1 tablet (100 mg total) by mouth daily.  . meclizine (ANTIVERT) 25 MG tablet Take 0.5 tablets (12.5 mg total) by mouth 3 (three) times daily as needed for dizziness.  . Multiple Vitamins-Minerals (MULTIVITAMIN WOMEN 50+) TABS Take 2 tablets by mouth daily.  Marland Kitchen NEXIUM 20 MG capsule Take 1 capsule (20 mg total) by mouth daily.  . Potassium Chloride ER 20 MEQ TBCR Take 20 mEq by mouth daily.  . fexofenadine (ALLEGRA ALLERGY) 180 MG tablet Take 180 mg by mouth as needed. Reported on 09/29/2015   No current facility-administered medications on file prior to visit.     Review of Systems  Constitutional: Negative for activity change, appetite change, chills, diaphoresis, fatigue and fever.  HENT: Positive for ear pain (R) and  hearing loss. Negative for congestion.   Eyes: Negative for visual disturbance.  Respiratory: Negative for apnea, cough, chest tightness, shortness of breath and wheezing.   Cardiovascular: Positive for leg swelling. Negative for chest pain and palpitations.  Gastrointestinal: Negative for abdominal pain, anal bleeding, blood in stool, constipation, diarrhea, nausea and vomiting.  Endocrine: Negative for cold intolerance.  Genitourinary: Positive for urgency. Negative for difficulty urinating, dysuria, frequency and hematuria.       Urinary incontinence, episodes  Musculoskeletal:  Negative for arthralgias, back pain and neck pain.  Skin: Negative for rash.  Allergic/Immunologic: Negative for environmental allergies.  Neurological: Negative for dizziness, weakness, light-headedness, numbness and headaches.  Hematological: Negative for adenopathy.  Psychiatric/Behavioral: Negative for behavioral problems, dysphoric mood and sleep disturbance.   Per HPI unless specifically indicated above     Objective:    BP (!) 155/55 (BP Location: Right Arm, Patient Position: Sitting, Cuff Size: Large)   Pulse 76   Temp 98.1 F (36.7 C) (Oral)   Resp 16   Ht 5' 5.5" (1.664 m)   Wt 215 lb (97.5 kg)   BMI 35.23 kg/m   Wt Readings from Last 3 Encounters:  12/26/17 215 lb (97.5 kg)  12/26/17 215 lb (97.5 kg)  06/21/17 234 lb (106.1 kg)    Physical Exam  Constitutional: She is oriented to person, place, and time. She appears well-developed and well-nourished. No distress.  Well-appearing, comfortable, cooperative  HENT:  Head: Normocephalic and atraumatic.  Mouth/Throat: Oropharynx is clear and moist.  Frontal / maxillary sinuses non-tender. Nares patent without purulence or edema. Removal of hearing aids. R side with some clear minimal effusion, no other abnormality.  Oropharynx clear without erythema, exudates, edema or asymmetry.  Eyes: Pupils are equal, round, and reactive to light.  Conjunctivae and EOM are normal. Right eye exhibits no discharge. Left eye exhibits no discharge.  Neck: Normal range of motion. Neck supple. No thyromegaly present.  Cardiovascular: Normal rate, regular rhythm, normal heart sounds and intact distal pulses.  No murmur heard. Pulmonary/Chest: Effort normal and breath sounds normal. No respiratory distress. She has no wheezes. She has no rales.  Abdominal: Soft. Bowel sounds are normal. She exhibits no distension and no mass. There is no tenderness.  Musculoskeletal: Normal range of motion. She exhibits no edema or tenderness.  Upper / Lower Extremities: - Normal muscle tone, strength bilateral upper extremities 5/5, lower extremities 5/5  Lymphadenopathy:    She has no cervical adenopathy.  Neurological: She is alert and oriented to person, place, and time.  Distal sensation intact to light touch all extremities  Skin: Skin is warm and dry. No rash noted. She is not diaphoretic. No erythema.  Psychiatric: She has a normal mood and affect. Her behavior is normal.  Well groomed, good eye contact, normal speech and thoughts  Nursing note and vitals reviewed.  Results for orders placed or performed in visit on 12/22/17  TSH  Result Value Ref Range   TSH 2.48 0.40 - 4.50 mIU/L  Lipid panel  Result Value Ref Range   Cholesterol 211 (H) <200 mg/dL   HDL 46 (L) >50 mg/dL   Triglycerides 178 (H) <150 mg/dL   LDL Cholesterol (Calc) 134 (H) mg/dL (calc)   Total CHOL/HDL Ratio 4.6 <5.0 (calc)   Non-HDL Cholesterol (Calc) 165 (H) <130 mg/dL (calc)  COMPLETE METABOLIC PANEL WITH GFR  Result Value Ref Range   Glucose, Bld 92 65 - 99 mg/dL   BUN 15 7 - 25 mg/dL   Creat 0.86 0.60 - 0.88 mg/dL   GFR, Est Non African American 64 > OR = 60 mL/min/1.46m2   GFR, Est African American 74 > OR = 60 mL/min/1.86m2   BUN/Creatinine Ratio NOT APPLICABLE 6 - 22 (calc)   Sodium 140 135 - 146 mmol/L   Potassium 3.7 3.5 - 5.3 mmol/L   Chloride 99 98 - 110  mmol/L   CO2 30 20 - 32 mmol/L   Calcium 9.3 8.6 - 10.4 mg/dL  Total Protein 6.3 6.1 - 8.1 g/dL   Albumin 3.9 3.6 - 5.1 g/dL   Globulin 2.4 1.9 - 3.7 g/dL (calc)   AG Ratio 1.6 1.0 - 2.5 (calc)   Total Bilirubin 0.3 0.2 - 1.2 mg/dL   Alkaline phosphatase (APISO) 71 33 - 130 U/L   AST 15 10 - 35 U/L   ALT 16 6 - 29 U/L  CBC with Differential/Platelet  Result Value Ref Range   WBC 7.4 3.8 - 10.8 Thousand/uL   RBC 4.40 3.80 - 5.10 Million/uL   Hemoglobin 13.2 11.7 - 15.5 g/dL   HCT 40.1 35.0 - 45.0 %   MCV 91.1 80.0 - 100.0 fL   MCH 30.0 27.0 - 33.0 pg   MCHC 32.9 32.0 - 36.0 g/dL   RDW 13.5 11.0 - 15.0 %   Platelets 270 140 - 400 Thousand/uL   MPV 11.2 7.5 - 12.5 fL   Neutro Abs 5,195 1,500 - 7,800 cells/uL   Lymphs Abs 1,495 850 - 3,900 cells/uL   WBC mixed population 518 200 - 950 cells/uL   Eosinophils Absolute 141 15 - 500 cells/uL   Basophils Absolute 52 0 - 200 cells/uL   Neutrophils Relative % 70.2 %   Total Lymphocyte 20.2 %   Monocytes Relative 7.0 %   Eosinophils Relative 1.9 %   Basophils Relative 0.7 %  Hemoglobin A1c  Result Value Ref Range   Hgb A1c MFr Bld 5.7 (H) <5.7 % of total Hgb   Mean Plasma Glucose 117 (calc)   eAG (mmol/L) 6.5 (calc)      Assessment & Plan:   Problem List Items Addressed This Visit    Chronic pain (Chronic)    No longer followed by [facility] Advised to inc regular Tylenol high dose, and limit high dose OTC aspirin for back/body aches, advised this is meant to be short term only med not long term for pain      Relevant Medications   gabapentin (NEURONTIN) 100 MG capsule   Chronic venous insufficiency    Followed by Vascular Dr Lucky Cowboy Chronic problem LE venous reflux On Thiazide, limited benefit, improved with elevation but not elevating to appropriate level  Recommend max RICE therapy as discussed Holding loop diuretic due to risk of CKD Follow-up as planned      CKD (chronic kidney disease), stage III (HCC)     Stable to improved CKD-II to III with Cr / GFR Remains off NSAID but on ASA high dose regularly for pain - advised her caution with Aspirin 500mg  Again advised to take high dose Tylenol and limit aspirin      COPD (chronic obstructive pulmonary disease) with chronic bronchitis (HCC)    Chronic problem, currently stable without flare Followed by [facility] Pulmonology On [facility]      Essential hypertension    Controlled HTN, mild elevated today - Home BP readings improved Complication with CKD-II to III, improved    Plan:  1. Continue current BP regimen Losartan 100mg  daily, Chlorthalidone 25mg  daily, Amlodipine 5mg   2. Encourage improved lifestyle - low sodium diet, regular exercise 3. Continue monitor BP outside office, bring readings to next visit, if persistently >140/90 or new symptoms notify office sooner      History of left breast cancer    S/p treatment lumpectomy, chemotherapy Prior followed [facility] ONcology      Hyperlipidemia    Not controlled Last lipid panel 12/2017 Calculated ASCVD 10 yr risk score >30%  Plan: 1. Discussed reducing  ASCVD risk strategy - prior failed statins, declines again even low dose or intermittent dosing 2. Previously advised her to take low dose ASA 81 in past - but she is taking high dose 500mg  instead for arthritis. Advised her caution with this and prefer limit to only low dose 81 if taking - ideally statin therapy intermittent is preferred 3. Encourage improved lifestyle - low carb/cholesterol, reduce portion size, continue improving regular exercise      Major depression in full remission ([place])    Remains improved mood, in remission Chronic history of MDD Previously on TCA Nortriptyline, remains off No recurrent flares Follow-up as planned      Myalgia due to statin    Cannot take statin due to myalgia      Peripheral polyneuropathy    Clinically with some bilateral lower ext foot neuropathic pain and symptoms Suspect multifactorial,  can be related to other chronic pain etiology, OA/DJD, also can be with possible preDM hyperglycemia, other likely factor with LE edema venous insufficiency  Trial on Gabapentin, dose adjust as advised Follow-up within 3 months, may consider refer to Neurology for 2nd opinion and nerve conduction testing if needed      Relevant Medications   gabapentin (NEURONTIN) 100 MG capsule   Pre-diabetes    Well-controlled Pre-DM with A1c 5.7 (stable from previous 5.9) Concern with obesity, HTN, HLD  Concern with some worsening peripheral neuropathy symptoms, seems less related to hyperglycemia.  Plan:  1. Not on any therapy currently  2. Encourage improved lifestyle - low carb, low sugar diet, reduce portion size, continue improving regular exercise       Other Visit Diagnoses    Annual physical exam    -  Primary  Updated Health Maintenance information Reviewed recent lab results with patient Encouraged improvement to lifestyle with diet and exercise    Right ear pain       Middle ear effusion, right      Uncertain etiology, suspect may be related to effusion and known vestibular/inner ear problems - patient had stopped flonase previously advised her to restart this now, no new rx needed May return to [facility] ENT if not improving given her complex ENT history        Meds ordered this encounter  Medications  . gabapentin (NEURONTIN) 100 MG capsule    Sig: Start 1 capsule daily, increase by 1 cap every 2-3 days as tolerated up to 3 times a day, or may take 3 at once in evening.    Dispense:  90 capsule    Refill:  1    Follow up plan: Return in about 3 months (around 03/27/2018) for Neuropathy (Flu Shot).  Nobie Putnam, [facility] 12/26/2017, 2:25 PM

## 2017-12-26 NOTE — Patient Instructions (Addendum)
Thank you for coming to the office today.  Please schedule and return for a NURSE ONLY VISIT for VACCINE - Approximately around end of September or October - Need High Dose Flu Vaccine  Start Gabapentin 100mg  capsules, take at night for 2-3 nights only, and then increase to 2 times a day for a few days, and then may increase to 3 times a day, it may make you drowsy, if helps significantly at night only, then you can increase instead to 3 capsules at night, instead of 3 times a day - In the future if needed, we can significantly increase the dose if tolerated well, some common doses are 300mg  three times a day up to 600mg  three times a day, usually it takes several weeks or months to get to higher doses  If neuropathy in feet and swelling is worsening, we can refer you to a Podiatrist for 2nd opinion  You have some fluid behind R ear, this pressure is probably causing pain Start nasal steroid Flonase 2 sprays in each nostril daily for 4-6 weeks, may repeat course seasonally or as needed If not improving please return to ENT Dr Tami Ribas  Please schedule a Follow-up Appointment to: Return in about 3 months (around 03/27/2018) for Neuropathy (Flu Shot).  If you have any other questions or concerns, please feel free to call the office or send a message through [place]. You may also schedule an earlier appointment if necessary.  Additionally, you may be receiving a survey about your experience at our office within a few days to 1 week by e-mail or mail. We value your feedback.  Nobie Putnam, DO [place]

## 2017-12-26 NOTE — Patient Instructions (Addendum)
Candace Tapia , Thank you for taking time to come for your Medicare Wellness Visit. I appreciate your ongoing commitment to your health goals. Please review the following plan we discussed and let me know if I can assist you in the future.   Screening recommendations/referrals: Colonoscopy: no longer required Mammogram: completed 10/03/2016 Bone Density: completed 10/03/2016 Recommended yearly ophthalmology/optometry visit for glaucoma screening and checkup Recommended yearly dental visit for hygiene and checkup  Vaccinations: Influenza vaccine: eligible now, available at our office 01/2018 Pneumococcal vaccine: completed series Tdap vaccine: up to date Shingles vaccine: shingrix eligible, check with your insurance company for coverage     Advanced directives: Please bring a copy of your health care power of attorney and living will to the office at your convenience.  Conditions/risks identified: recommend drinking at least 6-8 glasses of water a day   Next appointment: Follow up in one year for your annual wellness exam.    Preventive Care 65 Years and Older, Female Preventive care refers to lifestyle choices and visits with your health care provider that can promote health and wellness. What does preventive care include?  A yearly physical exam. This is also called an annual well check.  Dental exams once or twice a year.  Routine eye exams. Ask your health care provider how often you should have your eyes checked.  Personal lifestyle choices, including:  Daily care of your teeth and gums.  Regular physical activity.  Eating a healthy diet.  Avoiding tobacco and drug use.  Limiting alcohol use.  Practicing safe sex.  Taking low-dose aspirin every day.  Taking vitamin and mineral supplements as recommended by your health care provider. What happens during an annual well check? The services and screenings done by your health care provider during your annual well check  will depend on your age, overall health, lifestyle risk factors, and family history of disease. Counseling  Your health care provider may ask you questions about your:  Alcohol use.  Tobacco use.  Drug use.  Emotional well-being.  Home and relationship well-being.  Sexual activity.  Eating habits.  History of falls.  Memory and ability to understand (cognition).  Work and work Statistician.  Reproductive health. Screening  You may have the following tests or measurements:  Height, weight, and BMI.  Blood pressure.  Lipid and cholesterol levels. These may be checked every 5 years, or more frequently if you are over 31 years old.  Skin check.  Lung cancer screening. You may have this screening every year starting at age 34 if you have a 30-pack-year history of smoking and currently smoke or have quit within the past 15 years.  Fecal occult blood test (FOBT) of the stool. You may have this test every year starting at age 3.  Flexible sigmoidoscopy or colonoscopy. You may have a sigmoidoscopy every 5 years or a colonoscopy every 10 years starting at age 66.  Hepatitis C blood test.  Hepatitis B blood test.  Sexually transmitted disease (STD) testing.  Diabetes screening. This is done by checking your blood sugar (glucose) after you have not eaten for a while (fasting). You may have this done every 1-3 years.  Bone density scan. This is done to screen for osteoporosis. You may have this done starting at age 58.  Mammogram. This may be done every 1-2 years. Talk to your health care provider about how often you should have regular mammograms. Talk with your health care provider about your test results, treatment options, and if  necessary, the need for more tests. Vaccines  Your health care provider may recommend certain vaccines, such as:  Influenza vaccine. This is recommended every year.  Tetanus, diphtheria, and acellular pertussis (Tdap, Td) vaccine. You may  need a Td booster every 10 years.  Zoster vaccine. You may need this after age 57.  Pneumococcal 13-valent conjugate (PCV13) vaccine. One dose is recommended after age 74.  Pneumococcal polysaccharide (PPSV23) vaccine. One dose is recommended after age 23. Talk to your health care provider about which screenings and vaccines you need and how often you need them. This information is not intended to replace advice given to you by your health care provider. Make sure you discuss any questions you have with your health care provider. Document Released: 05/01/2015 Document Revised: 12/23/2015 Document Reviewed: 02/03/2015 Elsevier Interactive Patient Education  2017 [employer] Prevention in the Home Falls can cause injuries. They can happen to people of all ages. There are many things you can do to make your home safe and to help prevent falls. What can I do on the outside of my home?  Regularly fix the edges of walkways and driveways and fix any cracks.  Remove anything that might make you trip as you walk through a door, such as a raised step or threshold.  Trim any bushes or trees on the path to your home.  Use bright outdoor lighting.  Clear any walking paths of anything that might make someone trip, such as rocks or tools.  Regularly check to see if handrails are loose or broken. Make sure that both sides of any steps have handrails.  Any raised decks and porches should have guardrails on the edges.  Have any leaves, snow, or ice cleared regularly.  Use sand or salt on walking paths during winter.  Clean up any spills in your garage right away. This includes oil or grease spills. What can I do in the bathroom?  Use night lights.  Install grab bars by the toilet and in the tub and shower. Do not use towel bars as grab bars.  Use non-skid mats or decals in the tub or shower.  If you need to sit down in the shower, use a plastic, non-slip stool.  Keep the floor  dry. Clean up any water that spills on the floor as soon as it happens.  Remove soap buildup in the tub or shower regularly.  Attach bath mats securely with double-sided non-slip rug tape.  Do not have throw rugs and other things on the floor that can make you trip. What can I do in the bedroom?  Use night lights.  Make sure that you have a light by your bed that is easy to reach.  Do not use any sheets or blankets that are too big for your bed. They should not hang down onto the floor.  Have a firm chair that has side arms. You can use this for support while you get dressed.  Do not have throw rugs and other things on the floor that can make you trip. What can I do in the kitchen?  Clean up any spills right away.  Avoid walking on wet floors.  Keep items that you use a lot in easy-to-reach places.  If you need to reach something above you, use a strong step stool that has a grab bar.  Keep electrical cords out of the way.  Do not use floor polish or wax that makes floors slippery. If you must  use wax, use non-skid floor wax.  Do not have throw rugs and other things on the floor that can make you trip. What can I do with my stairs?  Do not leave any items on the stairs.  Make sure that there are handrails on both sides of the stairs and use them. Fix handrails that are broken or loose. Make sure that handrails are as long as the stairways.  Check any carpeting to make sure that it is firmly attached to the stairs. Fix any carpet that is loose or worn.  Avoid having throw rugs at the top or bottom of the stairs. If you do have throw rugs, attach them to the floor with carpet tape.  Make sure that you have a light switch at the top of the stairs and the bottom of the stairs. If you do not have them, ask someone to add them for you. What else can I do to help prevent falls?  Wear shoes that:  Do not have high heels.  Have rubber bottoms.  Are comfortable and fit you  well.  Are closed at the toe. Do not wear sandals.  If you use a stepladder:  Make sure that it is fully opened. Do not climb a closed stepladder.  Make sure that both sides of the stepladder are locked into place.  Ask someone to hold it for you, if possible.  Clearly mark and make sure that you can see:  Any grab bars or handrails.  First and last steps.  Where the edge of each step is.  Use tools that help you move around (mobility aids) if they are needed. These include:  Canes.  Walkers.  Scooters.  Crutches.  Turn on the lights when you go into a dark area. Replace any light bulbs as soon as they burn out.  Set up your furniture so you have a clear path. Avoid moving your furniture around.  If any of your floors are uneven, fix them.  If there are any pets around you, be aware of where they are.  Review your medicines with your doctor. Some medicines can make you feel dizzy. This can increase your chance of falling. Ask your doctor what other things that you can do to help prevent falls. This information is not intended to replace advice given to you by your health care provider. Make sure you discuss any questions you have with your health care provider. Document Released: 01/29/2009 Document Revised: 09/10/2015 Document Reviewed: 05/09/2014 Elsevier Interactive Patient Education  2017 [employer].

## 2017-12-27 DIAGNOSIS — G629 Polyneuropathy, unspecified: Secondary | ICD-10-CM | POA: Insufficient documentation

## 2017-12-27 NOTE — Assessment & Plan Note (Addendum)
No longer followed by [facility] Advised to inc regular Tylenol high dose, and limit high dose OTC aspirin for back/body aches, advised this is meant to be short term only med not long term for pain

## 2017-12-27 NOTE — Assessment & Plan Note (Signed)
Well-controlled Pre-DM with A1c 5.7 (stable from previous 5.9) Concern with obesity, HTN, HLD  Concern with some worsening peripheral neuropathy symptoms, seems less related to hyperglycemia.  Plan:  1. Not on any therapy currently  2. Encourage improved lifestyle - low carb, low sugar diet, reduce portion size, continue improving regular exercise

## 2017-12-27 NOTE — Assessment & Plan Note (Signed)
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

## 2017-12-27 NOTE — Assessment & Plan Note (Signed)
Previously followed by [facility] Dr Mike Gip was on chemotherapy Has not returned. No new concerns.

## 2017-12-27 NOTE — Assessment & Plan Note (Signed)
Not controlled Last lipid panel 12/2017 Calculated ASCVD 10 yr risk score >30%  Plan: 1. Discussed reducing ASCVD risk strategy - prior failed statins, declines again even low dose or intermittent dosing 2. Previously advised her to take low dose ASA 81 in past - but she is taking high dose 500mg  instead for arthritis. Advised her caution with this and prefer limit to only low dose 81 if taking - ideally statin therapy intermittent is preferred 3. Encourage improved lifestyle - low carb/cholesterol, reduce portion size, continue improving regular exercise

## 2017-12-27 NOTE — Assessment & Plan Note (Signed)
S/p treatment lumpectomy, chemotherapy Prior followed [facility]

## 2017-12-27 NOTE — Assessment & Plan Note (Signed)
Cannot take statin due to myalgia

## 2017-12-27 NOTE — Assessment & Plan Note (Signed)
Followed by Vascular Dr Lucky Cowboy Chronic problem LE venous reflux On Thiazide, limited benefit, improved with elevation but not elevating to appropriate level  Recommend max RICE therapy as discussed Holding loop diuretic due to risk of CKD Follow-up as planned

## 2017-12-27 NOTE — Assessment & Plan Note (Signed)
Controlled HTN, mild elevated today - Home BP readings improved Complication with CKD-II to III, improved    Plan:  1. Continue current BP regimen Losartan 100mg  daily, Chlorthalidone 25mg  daily, Amlodipine 5mg   2. Encourage improved lifestyle - low sodium diet, regular exercise 3. Continue monitor BP outside office, bring readings to next visit, if persistently >140/90 or new symptoms notify office sooner

## 2017-12-27 NOTE — Assessment & Plan Note (Signed)
Chronic problem, currently stable without flare Followed by [facility] On Advair

## 2017-12-27 NOTE — Assessment & Plan Note (Signed)
Clinically with some bilateral lower ext foot neuropathic pain and symptoms Suspect multifactorial, can be related to other chronic pain etiology, OA/DJD, also can be with possible preDM hyperglycemia, other likely factor with LE edema venous insufficiency  Trial on Gabapentin, dose adjust as advised Follow-up within 3 months, may consider refer to Neurology for 2nd opinion and nerve conduction testing if needed

## 2017-12-27 NOTE — Assessment & Plan Note (Signed)
Remains improved mood, in remission Chronic history of MDD Previously on TCA Nortriptyline, remains off No recurrent flares Follow-up as planned 

## 2018-01-03 ENCOUNTER — Encounter: Payer: Medicare Other | Admitting: Family Medicine

## 2018-01-15 ENCOUNTER — Emergency Department
Admission: EM | Admit: 2018-01-15 | Discharge: 2018-01-15 | Disposition: A | Discharge status: Home / Self Care | Payer: Medicare Other | Attending: Emergency Medicine | Admitting: Emergency Medicine

## 2018-01-15 ENCOUNTER — Encounter: Payer: Self-pay | Admitting: Emergency Medicine

## 2018-01-15 ENCOUNTER — Other Ambulatory Visit: Payer: Self-pay

## 2018-01-15 ENCOUNTER — Emergency Department: Payer: Medicare Other

## 2018-01-15 DIAGNOSIS — M545 Low back pain, unspecified: Secondary | ICD-10-CM

## 2018-01-15 DIAGNOSIS — N183 Chronic kidney disease, stage 3 (moderate): Secondary | ICD-10-CM | POA: Diagnosis not present

## 2018-01-15 DIAGNOSIS — I129 Hypertensive chronic kidney disease with stage 1 through stage 4 chronic kidney disease, or unspecified chronic kidney disease: Secondary | ICD-10-CM | POA: Diagnosis not present

## 2018-01-15 DIAGNOSIS — J449 Chronic obstructive pulmonary disease, unspecified: Secondary | ICD-10-CM | POA: Diagnosis not present

## 2018-01-15 DIAGNOSIS — Z87891 Personal history of nicotine dependence: Secondary | ICD-10-CM | POA: Diagnosis not present

## 2018-01-15 DIAGNOSIS — R300 Dysuria: Secondary | ICD-10-CM | POA: Diagnosis not present

## 2018-01-15 DIAGNOSIS — M5489 Other dorsalgia: Secondary | ICD-10-CM | POA: Diagnosis present

## 2018-01-15 DIAGNOSIS — R1030 Lower abdominal pain, unspecified: Secondary | ICD-10-CM | POA: Insufficient documentation

## 2018-01-15 DIAGNOSIS — Z79899 Other long term (current) drug therapy: Secondary | ICD-10-CM | POA: Diagnosis not present

## 2018-01-15 DIAGNOSIS — K862 Cyst of pancreas: Secondary | ICD-10-CM | POA: Insufficient documentation

## 2018-01-15 DIAGNOSIS — K8020 Calculus of gallbladder without cholecystitis without obstruction: Secondary | ICD-10-CM | POA: Diagnosis not present

## 2018-01-15 LAB — URINALYSIS, COMPLETE (UACMP) WITH MICROSCOPIC
BACTERIA UA: NONE SEEN
Bilirubin Urine: NEGATIVE
Glucose, UA: NEGATIVE mg/dL
Ketones, ur: NEGATIVE mg/dL
Leukocytes, UA: NEGATIVE
NITRITE: NEGATIVE
PH: 5 (ref 5.0–8.0)
PROTEIN: NEGATIVE mg/dL
SPECIFIC GRAVITY, URINE: 1.016 (ref 1.005–1.030)

## 2018-01-15 LAB — CBC WITH DIFFERENTIAL/PLATELET
Basophils Absolute: 0.1 10*3/uL (ref 0–0.1)
Basophils Relative: 1 %
Eosinophils Absolute: 0.1 10*3/uL (ref 0–0.7)
Eosinophils Relative: 1 %
HEMATOCRIT: 38.7 % (ref 35.0–47.0)
Hemoglobin: 13.4 g/dL (ref 12.0–16.0)
LYMPHS ABS: 1.1 10*3/uL (ref 1.0–3.6)
LYMPHS PCT: 12 %
MCH: 31.1 pg (ref 26.0–34.0)
MCHC: 34.7 g/dL (ref 32.0–36.0)
MCV: 89.8 fL (ref 80.0–100.0)
MONOS PCT: 5 %
Monocytes Absolute: 0.4 10*3/uL (ref 0.2–0.9)
NEUTROS ABS: 7.4 10*3/uL — AB (ref 1.4–6.5)
Neutrophils Relative %: 81 %
Platelets: 239 10*3/uL (ref 150–440)
RBC: 4.31 MIL/uL (ref 3.80–5.20)
RDW: 14 % (ref 11.5–14.5)
WBC: 9 10*3/uL (ref 3.6–11.0)

## 2018-01-15 LAB — COMPREHENSIVE METABOLIC PANEL
ALT: 21 U/L (ref 0–44)
AST: 19 U/L (ref 15–41)
Albumin: 4.1 g/dL (ref 3.5–5.0)
Alkaline Phosphatase: 74 U/L (ref 38–126)
Anion gap: 9 (ref 5–15)
BILIRUBIN TOTAL: 0.4 mg/dL (ref 0.3–1.2)
BUN: 15 mg/dL (ref 8–23)
CALCIUM: 9 mg/dL (ref 8.9–10.3)
CO2: 30 mmol/L (ref 22–32)
CREATININE: 0.92 mg/dL (ref 0.44–1.00)
Chloride: 102 mmol/L (ref 98–111)
GFR calc non Af Amer: 57 mL/min — ABNORMAL LOW (ref >60)
Glucose, Bld: 109 mg/dL — ABNORMAL HIGH (ref 70–99)
Potassium: 3.1 mmol/L — ABNORMAL LOW (ref 3.5–5.1)
Sodium: 141 mmol/L (ref 135–145)
TOTAL PROTEIN: 7.3 g/dL (ref 6.5–8.1)

## 2018-01-15 NOTE — ED Notes (Addendum)
See triage note.Presents with lower back pain for about 3 days  Hx of osteo and states pain gets better once she gets up  Also has had some nausea and congestion this am  Nausea is better at present  Subjective fever at home   Pt is SOB with exertion d/t COPD

## 2018-01-15 NOTE — ED Notes (Signed)
Blood drawn by lab

## 2018-01-15 NOTE — ED Provider Notes (Signed)
[facility] Emergency Department Provider Note   ____________________________________________   First MD Initiated Contact with Patient 01/15/18 [phone]     (approximate)  I have reviewed the triage vital signs and the nursing notes.   HISTORY  Chief Complaint Back Pain   HPI Candace Tapia is a 82 y.o. female patient tells me she has had lower back pain constantly for about 2 days.  She says she has had pain like this before but it always went away in the past.  She says she has osteoarthritis and has been to the pain clinic and got possibly as many as 14 shots in her back.  This was sometime ago and she was doing fine up until just a few days ago.  She usually takes aspirin or Tylenol for the pain and that usually works but has not this time.  She complains of a little bit of pain in the lower abdomen when she pees as well.  Pain is a deep nagging pain is not severe just bothersome.  Patient has some numbness and tingling in her feet that she has had for some time.  She was diagnosed with neuropathy.  This neuropathy discomfort has been going on for much longer than the few days of back pain.  Pain is worse when she gets up in the morning she says but it gets better after she moves around for a while.  She told the nurse it was worse when she bends over.  Past Medical History:  Diagnosis Date  . Allergy   . Arthritis   . Breast cancer ([place]) 2014   radiation and taking exemestane  . Cancer of left breast ([place]) 10/27/2014  . COPD (chronic obstructive pulmonary disease) ([place])   . GERD (gastroesophageal reflux disease)   . H/O: hysterectomy   . History of lumpectomy 2014   LT breast    Patient Active Problem List   Diagnosis Date Noted  . Peripheral polyneuropathy 12/27/2017  . Vestibular disorder 03/20/2017  . CKD (chronic kidney disease), stage III ([place]) 12/09/2016  . Chronic venous insufficiency 11/25/2016  . Swelling of limb 11/01/2016  . Bilateral  lower extremity edema 10/27/2016  . Pre-diabetes 08/12/2016  . Carotid bruit 02/09/2016  . History of left breast cancer 02/09/2016  . Long term current use of opiate analgesic 02/08/2016  . Long term prescription opiate use 02/08/2016  . Drug level above therapeutic range 02/08/2016  . Chronic low back pain (Location of Tertiary source of pain) (Bilateral) (R>L) 02/08/2016  . Chronic knee pain (Location of Secondary source of pain) (Bilateral) (L>R) 02/08/2016  . History bilateral shoulder replacement 02/08/2016  . Chronic hip pain (Location of Primary Source of Pain) (Bilateral) (R>L) 01/07/2016  . Complete rupture of rotator cuff 01/07/2016  . Hyperlipidemia 01/07/2016  . Osteopenia 01/07/2016  . COPD (chronic obstructive pulmonary disease) with chronic bronchitis ([place]) 07/21/2015  . DDD (degenerative disc disease), lumbar 03/05/2015  . Lumbar facet syndrome (Bilateral) (R>L) 03/05/2015  . Sacroiliac joint dysfunction 03/05/2015  . Osteoarthritis of hip (Bilateral) (R>L) 03/05/2015  . DJD of shoulder 03/05/2015  . Abnormal finding on mammography 03/03/2015  . Back pain with radiation 03/03/2015  . Major depression in full remission ([place]) 03/03/2015  . Dry skin 03/03/2015  . Myalgia due to statin 03/03/2015  . Gastro-esophageal reflux disease without esophagitis 03/03/2015  . Allergic rhinitis, seasonal 03/03/2015  . Osteoarthritis of multiple joints 03/03/2015  . Essential hypertension 11/28/2014  . Chronic pain 11/28/2014  .  Vitamin D deficiency 11/28/2014  . Airway hyperreactivity 09/18/2013  . Microscopic hematuria 12/20/2011    Past Surgical History:  Procedure Laterality Date  . ABDOMINAL HYSTERECTOMY    . BREAST LUMPECTOMY Left 2014  . BREAST SURGERY Left 2014  . JOINT REPLACEMENT    . LYMPH NODE DISSECTION     neck  . TOTAL SHOULDER REPLACEMENT Bilateral 2010 and 2013    Prior to Admission medications   Medication Sig Start Date End Date Taking? Authorizing  Provider  acetaminophen (TYLENOL) 650 MG CR tablet Take 650 mg by mouth every 8 (eight) hours as needed for pain.    [provider]  albuterol (PROAIR HFA) 108 (90 BASE) MCG/ACT inhaler Inhale 1 puff into the lungs every 6 (six) hours as needed.     [provider]  amLODipine (NORVASC) 5 MG tablet Take 1 tablet (5 mg total) by mouth daily. 07/31/17   Mikey College, NP  chlorthalidone (HYGROTON) 25 MG tablet Take 1 tablet (25 mg total) by mouth daily. 06/21/17   Karamalegos, Devonne Doughty, DO  fexofenadine (ALLEGRA ALLERGY) 180 MG tablet Take 180 mg by mouth as needed. Reported on 09/29/2015 01/29/14   [provider]  fluticasone (FLONASE) 50 MCG/ACT nasal spray Place 1 spray into both nostrils as needed.     [provider]  fluticasone-salmeterol (ADVAIR HFA) 115-21 MCG/ACT inhaler Inhale 2 puffs into the lungs 2 (two) times daily. 05/15/17   Nicholes Mango, MD  gabapentin (NEURONTIN) 100 MG capsule Start 1 capsule daily, increase by 1 cap every 2-3 days as tolerated up to 3 times a day, or may take 3 at once in evening. 12/26/17   Karamalegos, Devonne Doughty, DO  losartan (COZAAR) 100 MG tablet Take 1 tablet (100 mg total) by mouth daily. 07/31/17   Mikey College, NP  meclizine (ANTIVERT) 25 MG tablet Take 0.5 tablets (12.5 mg total) by mouth 3 (three) times daily as needed for dizziness. 03/29/17   Mikey College, NP  Multiple Vitamins-Minerals (MULTIVITAMIN WOMEN 50+) TABS Take 2 tablets by mouth daily.    [provider]  NEXIUM 20 MG capsule Take 1 capsule (20 mg total) by mouth daily. 07/09/15   Arlis Porta., MD  Potassium Chloride ER 20 MEQ TBCR Take 20 mEq by mouth daily. 06/21/17   Karamalegos, Devonne Doughty, DO    Allergies Tramadol hcl; Statins; Penicillin g; Penicillins; and Sulfa antibiotics  Family History  Problem Relation Age of Onset  . Breast cancer Mother 63  . Diabetes Mother   . Kidney disease Father   .  Diabetes Father   . Breast cancer Sister 43    Social History Social History   Tobacco Use  . Smoking status: Former Smoker    Packs/day: 2.00    Years: 40.00    Pack years: 80.00    Last attempt to quit: 04/19/1993    Years since quitting: 24.7  . Smokeless tobacco: Never Used  Substance Use Topics  . Alcohol use: No    Alcohol/week: 0.0 standard drinks  . Drug use: No    Review of Systems  Constitutional: No fever/chills patient did feel warm this morning. Eyes: No visual changes. ENT: No sore throat. Cardiovascular: Denies chest pain. Respiratory: Denies shortness of breath. Gastrointestinal: No abdominal pain.  No nausea at present, she was nauseated this morning, no vomiting.  No diarrhea.  No constipation. Genitourinary: Negative for dysuria. Musculoskeletal: See HPI.  Patient is also had bilateral shoulder replacements  she tells me Skin: Negative for rash. Neurological: Negative for headaches, focal weakness   ____________________________________________   PHYSICAL EXAM:  VITAL SIGNS: ED Triage Vitals  Enc Vitals Group     BP 01/15/18 0712 (!) 146/75     Pulse Rate 01/15/18 0712 86     Resp 01/15/18 0712 18     Temp 01/15/18 0712 97.8 F (36.6 C)     Temp Source 01/15/18 0712 Oral     SpO2 01/15/18 0712 95 %     Weight 01/15/18 0713 210 lb (95.3 kg)     Height 01/15/18 0713 5' 5.5" (1.664 m)     Head Circumference --      Peak Flow --      Pain Score 01/15/18 0712 8     Pain Loc --      Pain Edu? --      Excl. in [place]? --     Constitutional: Alert and oriented. Well appearing and in no acute distress. Eyes: Conjunctivae are normal.  Head: Atraumatic. Nose: No congestion/rhinnorhea. Mouth/Throat: Mucous membranes are moist.  Oropharynx non-erythematous. Neck: No stridor.   Cardiovascular: Normal rate, regular rhythm. Grossly normal heart sounds.  Good peripheral circulation. Respiratory: Normal respiratory effort.  No retractions. Lungs  CTAB. Gastrointestinal: Soft and nontender. No distention. No abdominal bruits. No CVA tenderness. Musculoskeletal: No lower extremity tenderness nor edema.  Patient is not tender on palpation percussion of the back or the spine Neurologic:  Normal speech and language. No gross focal neurologic deficits are appreciated. No gait instability. Skin:  Skin is warm, dry and intact. No rash noted. Psychiatric: Mood and affect are normal. Speech and behavior are normal.  ____________________________________________   LABS (all labs ordered are listed, but only abnormal results are displayed)  Labs Reviewed  URINALYSIS, COMPLETE (UACMP) WITH MICROSCOPIC - Abnormal; Notable for the following components:      Result Value   Color, Urine YELLOW (*)    APPearance CLEAR (*)    Hgb urine dipstick MODERATE (*)    All other components within normal limits  CBC WITH DIFFERENTIAL/PLATELET - Abnormal; Notable for the following components:   Neutro Abs 7.4 (*)    All other components within normal limits  COMPREHENSIVE METABOLIC PANEL - Abnormal; Notable for the following components:   Potassium 3.1 (*)    Glucose, Bld 109 (*)    GFR calc non Af Amer 57 (*)    All other components within normal limits   ____________________________________________  EKG   ____________________________________________  RADIOLOGY  ED MD interpretation: CT shows a possible cyst on the pancreas which will require follow-up but nothing that would explain her back pain.  Official radiology report(s): Ct Renal Stone Study  Result Date: 01/15/2018 CLINICAL DATA:  Low back pain x 2 days. Pain increases with leaning over and when getting up to walk. No hx kidney stones or diverticulitis. HX Rt breast ca and partial hysterectomy. EXAM: CT ABDOMEN AND PELVIS WITHOUT CONTRAST TECHNIQUE: Multidetector CT imaging of the abdomen and pelvis was performed following the standard protocol without IV contrast. COMPARISON:  None.  FINDINGS: Lower chest: Mild bronchiectasis with areas of peribronchovascular scarring and bronchiolar mucous plugging at both lung bases. Heart normal size. Hepatobiliary: Decreased attenuation of the liver consistent with fatty infiltration. No mass or focal lesion. Small dependent gallstones. No gallbladder wall thickening or adjacent inflammation. No bile duct dilation. Pancreas: 2.0 x 1.4 x 1.5 cm low-density mass protrudes anteriorly from the pancreatic neck/body region, average Hounsfield  units of 8 consistent with a cystic mass. No other pancreatic masses. No inflammation. Spleen: Normal in size without focal abnormality. Adrenals/Urinary Tract: No adrenal masses. Kidneys normal size, orientation and position. 2.2 cm low-density upper pole right renal mass. 10 mm subtle low-density upper pole left renal mass, both consistent with cysts. No other renal masses, no stones and no hydronephrosis. Ureters are normal course and caliber. No ureteral stones. Bladder is unremarkable other than a cystocele. Stomach/Bowel: Stomach and small bowel are unremarkable. Colon is normal caliber. There are scattered colonic diverticula. No diverticulitis or other colon inflammatory process. Appendix not discretely seen. No evidence of appendicitis. Vascular/Lymphatic: Aortic atherosclerosis. No enlarged abdominal or pelvic lymph nodes. Reproductive: Status post hysterectomy. No adnexal masses. Other: No abdominal wall hernia or abnormality. No abdominopelvic ascites. Musculoskeletal: No fracture or acute finding. No osteoblastic or osteolytic lesions. Mild degenerative changes of the mid to lower lumbar spine, primarily facet degenerative change. No convincing disc herniation. IMPRESSION: 1. No acute findings. 2. 2 cm low-attenuation pancreatic mass. Recommend follow-up pancreatic protocol CT or MRI with and without contrast to further characterize this lesion. 3. Hepatic steatosis. 4. Gallstones. 5. Aortic atherosclerosis.  Electronically Signed   By: Lajean Manes M.D.   On: 01/15/2018 10:50    ____________________________________________   PROCEDURES  Procedure(s) performed:   Procedures  Critical Care performed:   ____________________________________________   INITIAL IMPRESSION / ASSESSMENT AND PLAN / ED COURSE  Patient has some hematuria and minimal belly pain.  We will get a CT without contrast to make sure there is no aneurysm going on.  Although this seems somewhat unlikely.  We will also give [place] a better view of her back.  ----------------------------------------- 11:32 AM on 01/15/2018 -----------------------------------------  I will have her follow-up with her primary care doctor for the cyst on her pancreas.  I will have her try some Aleve or Motrin just for 2 or 3 days to see if it helps with her back pain.  I will have her stop her aspirin if she uses either 1 of these.  I will have her follow-up with either Dr. Mack Guise in orthopedics to the pain clinic unless the pain completely resolved.         ____________________________________________   FINAL CLINICAL IMPRESSION(S) / ED DIAGNOSES  Final diagnoses:  Acute low back pain without sciatica, unspecified back pain laterality     ED Discharge Orders    None       Note:  This document was prepared using Dragon voice recognition software and may include unintentional dictation errors.    Nena Polio, MD 01/15/18 [phone]

## 2018-01-15 NOTE — Discharge Instructions (Addendum)
Try 2 Aleve twice a day with food for 2 days or 3 Motrin 3 times a day with food for 2 or 3 days to see if that helps the back pain.  Do not take aspirin with either of these medicines.  Do not take the Aleve for more than 2 days of the Motrin for more than 3 days.  If this does not work or if it works and then the pain comes back afterwards I would follow-up with either orthopedics, Dr. Mack Guise, whose name I included in the follow-up, or with the pain clinic.  Please have your doctor check on you in the next few days.  You have a cyst on the pancreas that will require some follow-up.  He can arrange that for you.  Please return here for worse pain fever vomiting or feeling sicker.

## 2018-01-15 NOTE — ED Triage Notes (Signed)
Low back pain x 2 days. Pain increases with leaning over and when getting up to walk.

## 2018-01-15 NOTE — ED Notes (Signed)
Pt moved to room 17   Report called to [facility]

## 2018-01-20 DIAGNOSIS — J449 Chronic obstructive pulmonary disease, unspecified: Secondary | ICD-10-CM | POA: Diagnosis not present

## 2018-01-23 ENCOUNTER — Encounter: Payer: Self-pay | Admitting: Family Medicine

## 2018-01-23 ENCOUNTER — Ambulatory Visit (INDEPENDENT_AMBULATORY_CARE_PROVIDER_SITE_OTHER): Payer: Medicare Other | Admitting: Family Medicine

## 2018-01-23 VITALS — BP 120/77 | HR 93 | Temp 98.1°F | Resp 16 | Ht 65.5 in | Wt 211.8 lb

## 2018-01-23 DIAGNOSIS — M549 Dorsalgia, unspecified: Secondary | ICD-10-CM | POA: Diagnosis not present

## 2018-01-23 DIAGNOSIS — Z23 Encounter for immunization: Secondary | ICD-10-CM | POA: Diagnosis not present

## 2018-01-23 DIAGNOSIS — K8689 Other specified diseases of pancreas: Secondary | ICD-10-CM | POA: Diagnosis not present

## 2018-01-23 NOTE — Progress Notes (Signed)
Subjective:    Patient ID: Candace Tapia, female    DOB: [birth year], 82 y.o.   MRN: [id number]  Candace Tapia is a 82 y.o. female presenting on 01/23/2018 for Hospitalization Follow-up (back pain)   HPI   ED FOLLOW-UP VISIT  Hospital/Location: [place] Date of ED Visit: 01/15/18  Reason for Presenting to ED: back pain Primary (+Secondary) Diagnosis: acute back pain, pancreatic mass incidental finding  FOLLOW-UP  - ED provider note and record have been reviewed - Patient presents today about 8 days after recent ED visit. Brief summary of recent course, patient had symptoms of back pain, acute worse, thought she slept wrong, see note from ED for HPI, taken by son to ED for evaluation, testing in ED with UA showed some Hgb / blood in urine, then had labs and CT Renal Stone Study, negative for nephrolithiasis, but did show incidental 2 cm indeterminate mass of pancreas.  - Today reports overall has done well after discharge from ED. Symptoms of back pain have mostly resolved   - New medications on discharge: None - Changes to current meds on discharge: None  Additional brief complaints - She has spots on face thought to be non healing lesion, she is asking if can see dermatology to remove   Health Maintenance: Due for Flu Shot, will receive today   Depression screen [facility] 2/9 01/23/2018 12/26/2017 12/26/2017  Decreased Interest 0 0 0  Down, Depressed, Hopeless 0 0 0  PHQ - 2 Score 0 0 0  Altered sleeping 0 0 -  Tired, decreased energy 0 0 -  Change in appetite 0 0 -  Feeling bad or failure about yourself  0 0 -  Trouble concentrating 0 0 -  Moving slowly or fidgety/restless 0 0 -  Suicidal thoughts 0 0 -  PHQ-9 Score 0 0 -  Difficult doing work/chores Not difficult at all Not difficult at all -  Some recent data might be hidden    Social History   Tobacco Use  . Smoking status: Former Smoker    Packs/day: 2.00    Years: 40.00    Pack years: 80.00    Last attempt to  quit: 04/19/1993    Years since quitting: 24.7  . Smokeless tobacco: Never Used  Substance Use Topics  . Alcohol use: No    Alcohol/week: 0.0 standard drinks  . Drug use: No    Review of Systems Per HPI unless specifically indicated above     Objective:    BP 120/77   Pulse 93   Temp 98.1 F (36.7 C) (Oral)   Resp 16   Ht 5' 5.5" (1.664 m)   Wt 211 lb 12.8 oz (96.1 kg)   BMI 34.71 kg/m   Wt Readings from Last 3 Encounters:  01/23/18 211 lb 12.8 oz (96.1 kg)  01/15/18 210 lb (95.3 kg)  12/26/17 215 lb (97.5 kg)    Physical Exam  Constitutional: She is oriented to person, place, and time. She appears well-developed and well-nourished. No distress.  Well-appearing, comfortable, cooperative  HENT:  Head: Normocephalic and atraumatic.  Mouth/Throat: Oropharynx is clear and moist.  No rhinorrhea  Eyes: Conjunctivae are normal. Right eye exhibits no discharge. Left eye exhibits no discharge.  Neck: Normal range of motion. Neck supple. No thyromegaly present.  Cardiovascular: Normal rate, regular rhythm, normal heart sounds and intact distal pulses.  No murmur heard. Pulmonary/Chest: Effort normal and breath sounds normal. No respiratory distress. She has no wheezes. She has  no rales.  Abdominal: Soft. Bowel sounds are normal. She exhibits no distension. There is no tenderness.  Musculoskeletal: Normal range of motion. She exhibits no edema.  Lymphadenopathy:    She has no cervical adenopathy.  Neurological: She is alert and oriented to person, place, and time.  Skin: Skin is warm and dry. No rash noted. She is not diaphoretic. No erythema.  Psychiatric: She has a normal mood and affect. Her behavior is normal.  Well groomed, good eye contact, normal speech and thoughts  Nursing note and vitals reviewed.    I have personally reviewed the radiology report from 01/15/18 CT Renal Stone Study.  CLINICAL DATA: Low back pain x 2 days. Pain increases with leaning over and when  getting up to walk. No hx kidney stones or diverticulitis. HX Rt breast ca and partial hysterectomy.  EXAM: CT ABDOMEN AND PELVIS WITHOUT CONTRAST  TECHNIQUE: Multidetector CT imaging of the abdomen and pelvis was performed following the standard protocol without IV contrast.  COMPARISON: None.  FINDINGS: Lower chest: Mild bronchiectasis with areas of peribronchovascular scarring and bronchiolar mucous plugging at both lung bases. Heart normal size.  Hepatobiliary: Decreased attenuation of the liver consistent with fatty infiltration. No mass or focal lesion. Small dependent gallstones. No gallbladder wall thickening or adjacent inflammation. No bile duct dilation.  Pancreas: 2.0 x 1.4 x 1.5 cm low-density mass protrudes anteriorly from the pancreatic neck/body region, average Hounsfield units of 8 consistent with a cystic mass. No other pancreatic masses. No inflammation.  Spleen: Normal in size without focal abnormality.  Adrenals/Urinary Tract: No adrenal masses.  Kidneys normal size, orientation and position. 2.2 cm low-density upper pole right renal mass. 10 mm subtle low-density upper pole left renal mass, both consistent with cysts. No other renal masses, no stones and no hydronephrosis.  Ureters are normal course and caliber. No ureteral stones. Bladder is unremarkable other than a cystocele.  Stomach/Bowel: Stomach and small bowel are unremarkable. Colon is normal caliber. There are scattered colonic diverticula. No diverticulitis or other colon inflammatory process. Appendix not discretely seen. No evidence of appendicitis.  Vascular/Lymphatic: Aortic atherosclerosis. No enlarged abdominal or pelvic lymph nodes.  Reproductive: Status post hysterectomy. No adnexal masses.  Other: No abdominal wall hernia or abnormality. No abdominopelvic ascites.  Musculoskeletal: No fracture or acute finding. No osteoblastic or osteolytic lesions. Mild degenerative  changes of the mid to lower lumbar spine, primarily facet degenerative change. No convincing disc herniation.  IMPRESSION: 1. No acute findings. 2. 2 cm low-attenuation pancreatic mass. Recommend follow-up pancreatic protocol CT or MRI with and without contrast to further characterize this lesion. 3. Hepatic steatosis. 4. Gallstones. 5. Aortic atherosclerosis.   Electronically Signed By: Lajean Manes M.D. On: 01/15/2018 10:50   Results for orders placed or performed during the hospital encounter of 01/15/18  Urinalysis, Complete w Microscopic  Result Value Ref Range   Color, Urine YELLOW (A) YELLOW   APPearance CLEAR (A) CLEAR   Specific Gravity, Urine 1.016 1.005 - 1.030   pH 5.0 5.0 - 8.0   Glucose, UA NEGATIVE NEGATIVE mg/dL   Hgb urine dipstick MODERATE (A) NEGATIVE   Bilirubin Urine NEGATIVE NEGATIVE   Ketones, ur NEGATIVE NEGATIVE mg/dL   Protein, ur NEGATIVE NEGATIVE mg/dL   Nitrite NEGATIVE NEGATIVE   Leukocytes, UA NEGATIVE NEGATIVE   RBC / HPF 6-10 0 - 5 RBC/hpf   WBC, UA 0-5 0 - 5 WBC/hpf   Bacteria, UA NONE SEEN NONE SEEN   Squamous Epithelial / LPF 0-5  0 - 5   Mucus PRESENT    Hyaline Casts, UA PRESENT   CBC with Differential  Result Value Ref Range   WBC 9.0 3.6 - 11.0 K/uL   RBC 4.31 3.80 - 5.20 MIL/uL   Hemoglobin 13.4 12.0 - 16.0 g/dL   HCT 38.7 35.0 - 47.0 %   MCV 89.8 80.0 - 100.0 fL   MCH 31.1 26.0 - 34.0 pg   MCHC 34.7 32.0 - 36.0 g/dL   RDW 14.0 11.5 - 14.5 %   Platelets 239 150 - 440 K/uL   Neutrophils Relative % 81 %   Neutro Abs 7.4 (H) 1.4 - 6.5 K/uL   Lymphocytes Relative 12 %   Lymphs Abs 1.1 1.0 - 3.6 K/uL   Monocytes Relative 5 %   Monocytes Absolute 0.4 0.2 - 0.9 K/uL   Eosinophils Relative 1 %   Eosinophils Absolute 0.1 0 - 0.7 K/uL   Basophils Relative 1 %   Basophils Absolute 0.1 0 - 0.1 K/uL  Comprehensive metabolic panel  Result Value Ref Range   Sodium 141 135 - 145 mmol/L   Potassium 3.1 (L) 3.5 - 5.1 mmol/L    Chloride 102 98 - 111 mmol/L   CO2 30 22 - 32 mmol/L   Glucose, Bld 109 (H) 70 - 99 mg/dL   BUN 15 8 - 23 mg/dL   Creatinine, Ser 0.92 0.44 - 1.00 mg/dL   Calcium 9.0 8.9 - 10.3 mg/dL   Total Protein 7.3 6.5 - 8.1 g/dL   Albumin 4.1 3.5 - 5.0 g/dL   AST 19 15 - 41 U/L   ALT 21 0 - 44 U/L   Alkaline Phosphatase 74 38 - 126 U/L   Total Bilirubin 0.4 0.3 - 1.2 mg/dL   GFR calc non Af Amer 57 (L) >60 mL/min   GFR calc Af Amer >60 >60 mL/min   Anion gap 9 5 - 15      Assessment & Plan:   Problem List Items Addressed This Visit    None    Visit Diagnoses    Pancreatic mass    -  Primary   Needs flu shot       Relevant Orders   Flu vaccine HIGH DOSE PF (Fluzone High dose) (Completed)   Back pain, unspecified back location, unspecified back pain laterality, unspecified chronicity       Improved, now mostly resolved - unclear based on history and testing. Likely etiology is OA/DJD that is known     Focus office visit today on incidental finding of pancreatic mass, no known fam history concerning. Other than recent back /flank pain did not have symptoms of this before, not on prior imaging - Per Rad rec they wanted to check adv imaging for pancreas next to further classify mass, consider CT Pancreatic Protocol vs MRI - will contact radiology to review this recommendation, before ordering next imaging test, advised patient to stay tuned  No orders of the defined types were placed in this encounter.   Follow up plan: Return in about 3 months (around 04/25/2018), or if symptoms worsen or fail to improve, for follow-up pancreatic mass on imaging.  Future order for repeat imaging ordered as above, will review results and follow-up with patient once obtained.  Nobie Putnam, [facility] 01/23/2018, 7:27 PM

## 2018-01-23 NOTE — Patient Instructions (Addendum)
Thank you for coming to the office today.  Stay tuned for an appointment for [facility] to check the mass on your pancreas, will need a new CT scan or MRI testing.  Most likely it is a cyst, but hopefully we can get more information from a new scan and discuss further.  It seems your symptoms have improved on current medicine.  Since it is non healing - and on face - I would recommend freezing or cryotherapy, may help it heal.  [facility] Dr. Ree Edman   [address] [place], [place] [address] Phone: [phone]  -----------------------  [facility]   [place], [place] [address] Hours: 8AM-5PM Phone: [phone]  -------------------------------  [facility] [place], [place] [address] Phone: [phone]  Please schedule a Follow-up Appointment to: Return in about 3 months (around 04/25/2018), or if symptoms worsen or fail to improve, for follow-up pancreatic mass on imaging.  If you have any other questions or concerns, please feel free to call the office or send a message through [place]. You may also schedule an earlier appointment if necessary.  Additionally, you may be receiving a survey about your experience at our office within a few days to 1 week by e-mail or mail. We value your feedback.  Nobie Putnam, DO [place]

## 2018-02-01 ENCOUNTER — Telehealth: Payer: Self-pay | Admitting: Family Medicine

## 2018-02-01 DIAGNOSIS — K862 Cyst of pancreas: Secondary | ICD-10-CM

## 2018-02-01 NOTE — Telephone Encounter (Signed)
[facility], spoke with Dr [name] regarding this patient. He was not involved in initial reading of her prior CT Renal Stone Study but has reviewed the case to give me additional advice today about ordering next image.  Regarding 2 cm incidental cystic pancreatic mass, based on [facility] - for yearly imaging follow-up for up to 5 years is recommended.  Dr Tery Sanfilippo agrees that MRI is good option for follow-up and recommended to order Abdominal MRI with and without contrast for pancreas.  ---------------  Order will be placed and next to be scheduled. Please notify patient of this plan and to expect she will get a call when this is scheduled. Most likely we may need to repeat this again yearly for up to 5 years or will discuss in future with her follow-up plan.  Candace Tapia, [facility] 02/01/2018, 12:44 PM

## 2018-02-01 NOTE — Telephone Encounter (Signed)
Patient is aware and appointment is scheduled °

## 2018-02-15 ENCOUNTER — Ambulatory Visit: Payer: Medicare Other

## 2018-02-15 ENCOUNTER — Ambulatory Visit
Admission: RE | Admit: 2018-02-15 | Discharge: 2018-02-15 | Disposition: A | Discharge status: Home / Self Care | Payer: Medicare Other | Source: Ambulatory Visit | Attending: Family Medicine | Admitting: Family Medicine

## 2018-02-15 DIAGNOSIS — K8020 Calculus of gallbladder without cholecystitis without obstruction: Secondary | ICD-10-CM | POA: Insufficient documentation

## 2018-02-15 DIAGNOSIS — N281 Cyst of kidney, acquired: Secondary | ICD-10-CM | POA: Diagnosis not present

## 2018-02-15 DIAGNOSIS — K8689 Other specified diseases of pancreas: Secondary | ICD-10-CM | POA: Diagnosis not present

## 2018-02-15 DIAGNOSIS — K862 Cyst of pancreas: Secondary | ICD-10-CM | POA: Insufficient documentation

## 2018-02-15 DIAGNOSIS — D1803 Hemangioma of intra-abdominal structures: Secondary | ICD-10-CM | POA: Insufficient documentation

## 2018-02-15 MED ORDER — GADOBUTROL 1 MMOL/ML IV SOLN
9.5000 mL | Freq: Once | INTRAVENOUS | Status: AC | PRN
  Administered 2018-02-15: 9.5 mL via INTRAVENOUS

## 2018-02-20 DIAGNOSIS — J449 Chronic obstructive pulmonary disease, unspecified: Secondary | ICD-10-CM | POA: Diagnosis not present

## 2018-03-22 DIAGNOSIS — J449 Chronic obstructive pulmonary disease, unspecified: Secondary | ICD-10-CM | POA: Diagnosis not present

## 2018-04-02 ENCOUNTER — Encounter: Payer: Self-pay | Admitting: Family Medicine

## 2018-04-02 ENCOUNTER — Ambulatory Visit (INDEPENDENT_AMBULATORY_CARE_PROVIDER_SITE_OTHER): Payer: Medicare Other | Admitting: Family Medicine

## 2018-04-02 VITALS — BP 138/68 | HR 96 | Temp 98.2°F | Resp 16 | Ht 65.5 in | Wt 216.6 lb

## 2018-04-02 DIAGNOSIS — G629 Polyneuropathy, unspecified: Secondary | ICD-10-CM | POA: Diagnosis not present

## 2018-04-02 MED ORDER — GABAPENTIN 400 MG PO CAPS: 400.0000 mg | ORAL_CAPSULE | Freq: Every day | ORAL | 1 refills | Status: DC

## 2018-04-02 NOTE — Assessment & Plan Note (Signed)
Significantly improved on Gabapentin 300mg  at night - also helping OA/DJD pain - Clinically seems multifactorial, most likely related to OA/DJD, some neuropathic component Also possible preDM hyperglycemia, other likely factor with LE edema venous insufficiency  Plan INCREASE dose Gabapentin from 300 to 400mg  nightly QHS - checked coverage on 300mg  caps and seemed it was not covered, but apparently 400mg  caps were covered - sent rx 90 day supply to her pharmacy, asked her to check on cost/coverage, if 300 or 400 is preferred and most cost effective will pick preferred one - if needed to be changed  Follow-up within 3 months for PreDM A1c and monitoring - in future may consider Neurology for NCS

## 2018-04-02 NOTE — Progress Notes (Signed)
Subjective:    Patient ID: Candace Tapia, female    DOB: Apr 18, 1936, 82 y.o.   MRN: [id number]  Candace Tapia is a 82 y.o. female presenting on 04/02/2018 for Peripheral Neuropathy   HPI   FOLLOW-UP Peripheral Neuropathy / History of Chronic Pain Syndrome OA/DJD - Last visit with me for this complaint was 12/2017, also has known chronic pain / arthritis and is no longer followed by pain management, treated with Gabapentin 100mg  dose titration, see prior notes for background information. - Interval update with significant improvement on Gabapentin, she states it helps her neuropathy, arthritis pain, and helps her sleep. - Today patient reports she is doing well, no new concerns with this problem. She states she would like to continue Gabapentin, due for refill. Asking about dosage options. She is taking all 3 pills Gabapentin 100mg  each = 300mg  nightly every night. She previously increased to that dose. Denies any side effects - Denies any worsening pain, numbness tingling weakness, fall or injury   Depression screen [facility] 2/9 04/02/2018 01/23/2018 12/26/2017  Decreased Interest 0 0 0  Down, Depressed, Hopeless 0 0 0  PHQ - 2 Score 0 0 0  Altered sleeping - 0 0  Tired, decreased energy - 0 0  Change in appetite - 0 0  Feeling bad or failure about yourself  - 0 0  Trouble concentrating - 0 0  Moving slowly or fidgety/restless - 0 0  Suicidal thoughts - 0 0  PHQ-9 Score - 0 0  Difficult doing work/chores - Not difficult at all Not difficult at all  Some recent data might be hidden    Social History   Tobacco Use  . Smoking status: Former Smoker    Packs/day: 2.00    Years: 40.00    Pack years: 80.00    Last attempt to quit: 04/19/1993    Years since quitting: 24.9  . Smokeless tobacco: Never Used  Substance Use Topics  . Alcohol use: No    Alcohol/week: 0.0 standard drinks  . Drug use: No    Review of Systems Per HPI unless specifically indicated above       Objective:    BP 138/68 (BP Location: Left Arm, Cuff Size: Normal)   Pulse 96   Temp 98.2 F (36.8 C) (Oral)   Resp 16   Ht 5' 5.5" (1.664 m)   Wt 216 lb 9.6 oz (98.2 kg)   SpO2 97%   BMI 35.50 kg/m   Wt Readings from Last 3 Encounters:  04/02/18 216 lb 9.6 oz (98.2 kg)  01/23/18 211 lb 12.8 oz (96.1 kg)  01/15/18 210 lb (95.3 kg)    Physical Exam Vitals signs and nursing note reviewed.  Constitutional:      General: She is not in acute distress.    Appearance: She is well-developed. She is not diaphoretic.     Comments: Well-appearing, comfortable, cooperative  HENT:     Head: Normocephalic and atraumatic.  Eyes:     General:        Right eye: No discharge.        Left eye: No discharge.     Conjunctiva/sclera: Conjunctivae normal.  Cardiovascular:     Rate and Rhythm: Normal rate.  Pulmonary:     Effort: Pulmonary effort is normal.  Skin:    General: Skin is warm and dry.     Findings: No erythema or rash.  Neurological:     Mental Status: She is alert and oriented  to person, place, and time.  Psychiatric:        Behavior: Behavior normal.     Comments: Well groomed, good eye contact, normal speech and thoughts        Assessment & Plan:   Problem List Items Addressed This Visit    Peripheral polyneuropathy - Primary    Significantly improved on Gabapentin 300mg  at night - also helping OA/DJD pain - Clinically seems multifactorial, most likely related to OA/DJD, some neuropathic component Also possible preDM hyperglycemia, other likely factor with LE edema venous insufficiency  Plan INCREASE dose Gabapentin from 300 to 400mg  nightly QHS - checked coverage on 300mg  caps and seemed it was not covered, but apparently 400mg  caps were covered - sent rx 90 day supply to her pharmacy, asked her to check on cost/coverage, if 300 or 400 is preferred and most cost effective will pick preferred one - if needed to be changed  Follow-up within 3 months for PreDM A1c and  monitoring - in future may consider Neurology for NCS      Relevant Medications   gabapentin (NEURONTIN) 400 MG capsule      Meds ordered this encounter  Medications  . gabapentin (NEURONTIN) 400 MG capsule    Sig: Take 1 capsule (400 mg total) by mouth at bedtime.    Dispense:  90 capsule    Refill:  1    Dose increase from 100mg  capsules x 3 = 300 - now increase to 400mg  nightly 1 pill, should be covered      Follow up plan: Return in about 3 months (around 07/02/2018) for PreDM A1c, neuropathy med adjust, HTN.  Nobie Putnam, [facility] 04/02/2018, 1:29 PM

## 2018-04-02 NOTE — Patient Instructions (Addendum)
Thank you for coming to the office today.  New rx Gabapentin 400mg  capsules sent to [facility] - it should be just 1 capsule at bedtime every night, sent 3 month supply and +1 refill.  Call if there is a problem with this med, we can change it if not covered  Please schedule a Follow-up Appointment to: Return in about 3 months (around 07/02/2018) for PreDM A1c, neuropathy med adjust, HTN.  If you have any other questions or concerns, please feel free to call the office or send a message through [place]. You may also schedule an earlier appointment if necessary.  Additionally, you may be receiving a survey about your experience at our office within a few days to 1 week by e-mail or mail. We value your feedback.  Nobie Putnam, DO [place]

## 2018-04-09 DIAGNOSIS — D2239 Melanocytic nevi of other parts of face: Secondary | ICD-10-CM | POA: Diagnosis not present

## 2018-04-09 DIAGNOSIS — L820 Inflamed seborrheic keratosis: Secondary | ICD-10-CM | POA: Diagnosis not present

## 2018-04-22 DIAGNOSIS — J449 Chronic obstructive pulmonary disease, unspecified: Secondary | ICD-10-CM | POA: Diagnosis not present

## 2018-04-30 DIAGNOSIS — D485 Neoplasm of uncertain behavior of skin: Secondary | ICD-10-CM | POA: Diagnosis not present

## 2018-04-30 DIAGNOSIS — D2239 Melanocytic nevi of other parts of face: Secondary | ICD-10-CM | POA: Diagnosis not present

## 2018-04-30 DIAGNOSIS — B078 Other viral warts: Secondary | ICD-10-CM | POA: Diagnosis not present

## 2018-05-23 DIAGNOSIS — J449 Chronic obstructive pulmonary disease, unspecified: Secondary | ICD-10-CM | POA: Diagnosis not present

## 2018-06-05 DIAGNOSIS — J449 Chronic obstructive pulmonary disease, unspecified: Secondary | ICD-10-CM | POA: Diagnosis not present

## 2018-06-05 DIAGNOSIS — R0609 Other forms of dyspnea: Secondary | ICD-10-CM | POA: Diagnosis not present

## 2018-06-21 DIAGNOSIS — J449 Chronic obstructive pulmonary disease, unspecified: Secondary | ICD-10-CM | POA: Diagnosis not present

## 2018-06-25 ENCOUNTER — Other Ambulatory Visit: Payer: Self-pay | Admitting: Family Medicine

## 2018-06-25 DIAGNOSIS — I10 Essential (primary) hypertension: Secondary | ICD-10-CM

## 2018-07-06 ENCOUNTER — Encounter: Payer: Self-pay | Admitting: Family Medicine

## 2018-07-06 ENCOUNTER — Ambulatory Visit (INDEPENDENT_AMBULATORY_CARE_PROVIDER_SITE_OTHER): Payer: Medicare Other | Admitting: Family Medicine

## 2018-07-06 ENCOUNTER — Other Ambulatory Visit: Payer: Self-pay

## 2018-07-06 VITALS — BP 113/60 | HR 91 | Temp 98.2°F | Resp 16 | Ht 65.5 in | Wt 215.6 lb

## 2018-07-06 DIAGNOSIS — F3342 Major depressive disorder, recurrent, in full remission: Secondary | ICD-10-CM | POA: Diagnosis not present

## 2018-07-06 DIAGNOSIS — G629 Polyneuropathy, unspecified: Secondary | ICD-10-CM

## 2018-07-06 DIAGNOSIS — R7303 Prediabetes: Secondary | ICD-10-CM | POA: Diagnosis not present

## 2018-07-06 DIAGNOSIS — H6122 Impacted cerumen, left ear: Secondary | ICD-10-CM | POA: Diagnosis not present

## 2018-07-06 LAB — POCT GLYCOSYLATED HEMOGLOBIN (HGB A1C): HEMOGLOBIN A1C: 5.6 % (ref 4.0–5.6)

## 2018-07-06 NOTE — Progress Notes (Signed)
Subjective:    Patient ID: Candace Tapia, female    DOB: 1935-04-20, 83 y.o.   MRN: [id number]  Candace Tapia is a 83 y.o. female presenting on 07/06/2018 for PreDM and Cerumen Impaction   HPI   Pre-Diabetes Reports no concerns. Last A1c trend 5.7 to 5.9 Due today for A1c Meds:Never on meds Improving low carb diet, mostly stable since prior Denies hypoglycemia, polyuria, visual changes, numbness or tingling.  L Ear cerumen impaction Recently has had increased pressure and wax in L ear, some reduced hearing at times. Prior cleaning, request repeat now. She has hearing aids as well.  Recurrent Depression, - in remission Reports she has prior history of some depressed mood but overall has managed to cope with it for long time and currently doing well. Cannot tolerate medication, remains off  FOLLOW-UP Peripheral Neuropathy She is taking gabapentin 400mg  nightly doing well, sleepy stopped otc sleep aid, asking if can take twice a day, helps arthritis as well Denies any worsening pain, numbness tingling weakness, fall or injury   Depression screen [facility] 2/9 07/06/2018 04/02/2018 01/23/2018  Decreased Interest 0 0 0  Down, Depressed, Hopeless 0 0 0  PHQ - 2 Score 0 0 0  Altered sleeping 0 - 0  Tired, decreased energy 0 - 0  Change in appetite 0 - 0  Feeling bad or failure about yourself  0 - 0  Trouble concentrating 0 - 0  Moving slowly or fidgety/restless 0 - 0  Suicidal thoughts 0 - 0  PHQ-9 Score 0 - 0  Difficult doing work/chores Not difficult at all - Not difficult at all  Some recent data might be hidden    Social History   Tobacco Use  . Smoking status: Former Smoker    Packs/day: 2.00    Years: 40.00    Pack years: 80.00    Last attempt to quit: 04/19/1993    Years since quitting: 25.2  . Smokeless tobacco: Never Used  Substance Use Topics  . Alcohol use: No    Alcohol/week: 0.0 standard drinks  . Drug use: No    Review of Systems Per HPI unless  specifically indicated above     Objective:    BP 113/60   Pulse 91   Temp 98.2 F (36.8 C) (Oral)   Resp 16   Ht 5' 5.5" (1.664 m)   Wt 215 lb 9.6 oz (97.8 kg)   BMI 35.33 kg/m   Wt Readings from Last 3 Encounters:  07/06/18 215 lb 9.6 oz (97.8 kg)  04/02/18 216 lb 9.6 oz (98.2 kg)  01/23/18 211 lb 12.8 oz (96.1 kg)    Physical Exam Vitals signs and nursing note reviewed.  Constitutional:      General: She is not in acute distress.    Appearance: She is well-developed. She is not diaphoretic.     Comments: Well-appearing elderly 83 year old female, comfortable, cooperative  HENT:     Head: Normocephalic and atraumatic.     Comments: Frontal / maxillary sinuses non-tender  Left TM obscured by some dry impacted cerumen. R TM is clear and normal.  Oropharynx clear without erythema, exudates, edema or asymmetry. Eyes:     General:        Right eye: No discharge.        Left eye: No discharge.     Conjunctiva/sclera: Conjunctivae normal.  Neck:     Musculoskeletal: Normal range of motion and neck supple.     Thyroid:  No thyromegaly.  Cardiovascular:     Rate and Rhythm: Normal rate and regular rhythm.     Heart sounds: Normal heart sounds. No murmur.  Pulmonary:     Effort: Pulmonary effort is normal. No respiratory distress.     Breath sounds: Normal breath sounds. No wheezing or rales.  Musculoskeletal:     Comments: Using cane and some difficulty with transition sitting to standing and up to exam table  Lymphadenopathy:     Cervical: No cervical adenopathy.  Skin:    General: Skin is warm and dry.     Findings: No erythema or rash.  Neurological:     Mental Status: She is alert and oriented to person, place, and time.  Psychiatric:        Behavior: Behavior normal.     Comments: Well groomed, good eye contact, normal speech and thoughts      ________________________________________________________ PROCEDURE NOTE Date: 07/06/18 Left Ear Lavage / Cerumen  Removal Discussed benefits and risks (including pain / discomforts, dizziness, minor abrasion of ear canal). Verbal consent given by patient. Medication:  No ear drops used. Ear Lavage Solution (warm water + hydrogen peroxide) Performed by Dr Parks Ranger Ear lavage solution flushed into LEFT ear only in attempt to dislodge and remove ear wax. Results were successful. Tolerated without difficulty.  Repeat Ear Exam: - Completely removed cerumen now, with clear ear canals and visible TMs clear and normal appearance.   Recent Labs    12/22/17 0807 07/06/18 1336  HGBA1C 5.7* 5.6     Results for orders placed or performed in visit on 07/06/18  POCT HgB A1C  Result Value Ref Range   Hemoglobin A1C 5.6 4.0 - 5.6 %      Assessment & Plan:   Problem List Items Addressed This Visit    Major depression in full remission ([place])    Remains improved mood, in remission Chronic history of MDD Previously on TCA Nortriptyline, remains off No recurrent flares Follow-up as planned      Peripheral polyneuropathy    Improved on Gabapentin 400mg  also helps OA/DJD pain - Clinically seems multifactorial, most likely related to OA/DJD, some neuropathic component Also possible preDM hyperglycemia, other likely factor with LE edema venous insufficiency  Plan INCREASE dose Gabapentin 400 nightly to 400 BID, will request new rx when ready Follow-up within 6 months for annual phys      Relevant Medications   gabapentin (NEURONTIN) 400 MG capsule   Pre-diabetes - Primary    Well controlled and improved Pre-DM with A1c 5.6 now Concern with obesity, HTN, HLD  Concern with neuropathy symptoms, seems less related to hyperglycemia.  Plan:  1. Not on any therapy currently  2. Encourage improved lifestyle - low carb, low sugar diet, reduce portion size, continue improving regular exercise      Relevant Orders   POCT HgB A1C (Completed)    Other Visit Diagnoses    Left ear impacted cerumen          Significant amount of large thick impacted cerumen Left ear, suspected primary cause of current reduced hearing  Plan: 1. Successful office ear lavage cerumen removal today, re-evaluated with clear ear canals and normal TMs 2. Counseled on avoiding Q-tips and may use Kleenex as wick, use OTC Debrox as needed 3. Follow-up as needed  No orders of the defined types were placed in this encounter.    Follow up plan: Return in about 6 months (around 01/06/2019) for Annual Physical.  Future labs  ordered for 01/02/19  Nobie Putnam, [facility] 07/06/2018, 1:28 PM

## 2018-07-06 NOTE — Patient Instructions (Addendum)
Thank you for coming to the office today.  Recent Labs    12/22/17 0807 07/06/18 1336  HGBA1C 5.7* 5.6   You can increase Gabapentin 400mg  - to now take one capsule TWICE a day - caution if sleepy on med during day.  When ready for a refill - CALL OUR OFFICE FIRST - and we can order TWICE as much for the new rx at the pharmacy.  --------   DUE for FASTING BLOOD WORK (no food or drink after midnight before the lab appointment, only water or coffee without cream/sugar on the morning of)  SCHEDULE "Lab Only" visit in the morning at the clinic for lab draw in 6 MONTHS   - Make sure Lab Only appointment is at about 1 week before your next appointment, so that results will be available  For Lab Results, once available within 2-3 days of blood draw, you can can log in to MyChart online to view your results and a brief explanation. Also, we can discuss results at next follow-up visit.   Please schedule a Follow-up Appointment to: Return in about 6 months (around 01/06/2019) for Annual Physical.  If you have any other questions or concerns, please feel free to call the office or send a message through [place]. You may also schedule an earlier appointment if necessary.  Additionally, you may be receiving a survey about your experience at our office within a few days to 1 week by e-mail or mail. We value your feedback.  Nobie Putnam, DO [place]

## 2018-07-07 ENCOUNTER — Encounter: Payer: Self-pay | Admitting: Family Medicine

## 2018-07-07 NOTE — Assessment & Plan Note (Signed)
Improved on Gabapentin 400mg  also helps OA/DJD pain - Clinically seems multifactorial, most likely related to OA/DJD, some neuropathic component Also possible preDM hyperglycemia, other likely factor with LE edema venous insufficiency  Plan INCREASE dose Gabapentin 400 nightly to 400 BID, will request new rx when ready Follow-up within 6 months for annual phys

## 2018-07-07 NOTE — Assessment & Plan Note (Signed)
Well controlled and improved Pre-DM with A1c 5.6 now Concern with obesity, HTN, HLD  Concern with neuropathy symptoms, seems less related to hyperglycemia.  Plan:  1. Not on any therapy currently  2. Encourage improved lifestyle - low carb, low sugar diet, reduce portion size, continue improving regular exercise

## 2018-07-07 NOTE — Assessment & Plan Note (Signed)
Remains improved mood, in remission Chronic history of MDD Previously on TCA Nortriptyline, remains off No recurrent flares Follow-up as planned

## 2018-07-22 DIAGNOSIS — J449 Chronic obstructive pulmonary disease, unspecified: Secondary | ICD-10-CM | POA: Diagnosis not present

## 2018-07-24 ENCOUNTER — Other Ambulatory Visit: Payer: Self-pay | Admitting: Family Medicine

## 2018-07-24 DIAGNOSIS — I10 Essential (primary) hypertension: Secondary | ICD-10-CM

## 2018-07-24 MED ORDER — LOSARTAN POTASSIUM 100 MG PO TABS: 100.0000 mg | ORAL_TABLET | Freq: Every day | ORAL | 1 refills | Status: DC

## 2018-07-24 MED ORDER — AMLODIPINE BESYLATE 5 MG PO TABS: 5.0000 mg | ORAL_TABLET | Freq: Every day | ORAL | 1 refills | Status: DC

## 2018-07-24 NOTE — Telephone Encounter (Signed)
Pt  Requesting refill on  Amlodipine,losartan called into  [facility]

## 2018-08-03 ENCOUNTER — Ambulatory Visit (INDEPENDENT_AMBULATORY_CARE_PROVIDER_SITE_OTHER): Payer: Medicare Other | Admitting: Nurse Practitioner

## 2018-08-03 ENCOUNTER — Encounter: Payer: Self-pay | Admitting: Nurse Practitioner

## 2018-08-03 ENCOUNTER — Other Ambulatory Visit: Payer: Self-pay

## 2018-08-03 VITALS — BP 130/86 | HR 108 | Temp 98.6°F | Ht 65.5 in | Wt 214.0 lb

## 2018-08-03 DIAGNOSIS — H90A32 Mixed conductive and sensorineural hearing loss, unilateral, left ear with restricted hearing on the contralateral side: Secondary | ICD-10-CM | POA: Diagnosis not present

## 2018-08-03 NOTE — Progress Notes (Signed)
Subjective:    Patient ID: Candace Tapia, female    DOB: 08-21-1935, 83 y.o.   MRN: [id number]  Candace Tapia is a 83 y.o. female presenting on 08/03/2018 for Ear Fullness (difficulty hearing w/ intemittent pain that radiates down the left side of the neck out of her Rt ear.  x 2 weeks )   HPI LEFT ear fullness/pain Patient reports 2 week course of ear fullness, decreased hearing.  This morning the patient could not hear her TV which is what made her call to schedule an appointment.  Patient is concerned about possible cerumen impaction as she has had ear wax buildup before. Had LEFT ear irrigated last time she visited the clinic. - Also of note is the patient reports progressively worsening hearing every day since onset 2 weeks ago.  Is an established patient with Dr. Tami Ribas at [facility].  Has had more sudden hearing loss of LEFT ear about 1.5 years ago that was sensorineural and has no available treatment option.  Social History   Tobacco Use  . Smoking status: Former Smoker    Packs/day: 2.00    Years: 40.00    Pack years: 80.00    Last attempt to quit: 04/19/1993    Years since quitting: 25.3  . Smokeless tobacco: Never Used  Substance Use Topics  . Alcohol use: No    Alcohol/week: 0.0 standard drinks  . Drug use: No    Review of Systems Per HPI unless specifically indicated above     Objective:    BP 130/86 (BP Location: Right Arm, Patient Position: Sitting, Cuff Size: Large)   Pulse (!) 108   Temp 98.6 F (37 C) (Oral)   Ht 5' 5.5" (1.664 m)   Wt 214 lb (97.1 kg)   BMI 35.07 kg/m   Wt Readings from Last 3 Encounters:  08/03/18 214 lb (97.1 kg)  07/06/18 215 lb 9.6 oz (97.8 kg)  04/02/18 216 lb 9.6 oz (98.2 kg)    Physical Exam Vitals signs reviewed.  Constitutional:      General: She is not in acute distress.    Appearance: She is well-developed. She is obese. She is not ill-appearing.  HENT:     Head: Normocephalic and atraumatic.     Right Ear:  Tympanic membrane, ear canal and external ear normal. Decreased hearing noted. No drainage, swelling or tenderness. No middle ear effusion. There is no impacted cerumen. No foreign body. Tympanic membrane is not erythematous, retracted or bulging.     Left Ear: Tympanic membrane, ear canal and external ear normal. Decreased hearing noted. No drainage, swelling or tenderness.  No middle ear effusion. There is no impacted cerumen. No foreign body. Tympanic membrane is not erythematous, retracted or bulging.     Ears:     Comments: Patient wears hearing aids or amplifiers bilaterally    Nose: Nose normal.     Mouth/Throat:     Lips: Pink.     Mouth: Mucous membranes are dry.  Lymphadenopathy:     Head:     Right side of head: No submental, submandibular, tonsillar, preauricular, posterior auricular or occipital adenopathy.     Left side of head: No submental, submandibular, tonsillar, preauricular, posterior auricular or occipital adenopathy.     Cervical: No cervical adenopathy.  Skin:    General: Skin is warm and dry.  Neurological:     Mental Status: She is alert and oriented to person, place, and time.  Psychiatric:  Behavior: Behavior normal.    Results for orders placed or performed in visit on 07/06/18  POCT HgB A1C  Result Value Ref Range   Hemoglobin A1C 5.6 4.0 - 5.6 %      Assessment & Plan:   Problem List Items Addressed This Visit    None    Visit Diagnoses    Sensorineural hearing loss (SNHL) of left ear with restricted hearing of right ear    -  Primary     Patient with continued conductive and sensorineural hearing loss likely.  Patient with existing hearing loss that is likely worsening to the point she has now lost the ability to hear sound wavelengths she previously could. - No signs and symptoms of infection  - No cerumen impaction - Normal ear exam from external to TM  Plan: 1. Continue monitoring.  Encouraged patient to call [facility] for  follow-up. 2. Continue using amplifiers as needed. 3. Follow-up prn.  Follow up plan: Return if symptoms worsen or fail to improve.  Cassell Smiles, DNP, AGPCNP-BC Adult Gerontology Primary Care Nurse Practitioner [facility] 08/03/2018, 1:50 PM

## 2018-08-03 NOTE — Patient Instructions (Addendum)
[name],   Thank you for coming in to clinic today.  1. Call Dr. Ileene Hutchinson office Monday morning.  - IF you suddenly lose your hearing, you may need to be seen in the ER this weekend.  You do not have any infection or ear wax buildup.  Most likely, your chronic hearing loss is worsening and there is no treatment.  Please schedule a follow-up appointment with Cassell Smiles, AGNP. Return if symptoms worsen or fail to improve.  If you have any other questions or concerns, please feel free to call the clinic or send a message through [place]. You may also schedule an earlier appointment if necessary.  You will receive a survey after today's visit either digitally by e-mail or paper by [employer]. Your experiences and feedback matter to [place].  Please respond so we know how we are doing as we provide care for you.   Cassell Smiles, DNP, AGNP-BC Adult Gerontology Nurse Practitioner [place]

## 2018-08-14 ENCOUNTER — Telehealth: Payer: Self-pay | Admitting: Family Medicine

## 2018-08-14 DIAGNOSIS — G629 Polyneuropathy, unspecified: Secondary | ICD-10-CM

## 2018-08-14 NOTE — Telephone Encounter (Signed)
Pt. called requesting refill on gabapentin called into [facility] she said it  Suppose to be double?

## 2018-08-15 MED ORDER — GABAPENTIN 400 MG PO CAPS: 400.0000 mg | ORAL_CAPSULE | Freq: Two times a day (BID) | ORAL | 1 refills | Status: DC

## 2018-08-15 NOTE — Telephone Encounter (Signed)
Sent to [facility] in [place] - we had already ordered this back in March. Sent it for double dose as we had discussed at last apt - for Gabapentin 400mg  TWICE daily, gave 90 day supply.  Nobie Putnam, [facility] 08/15/2018, 11:52 AM

## 2018-08-21 DIAGNOSIS — J449 Chronic obstructive pulmonary disease, unspecified: Secondary | ICD-10-CM | POA: Diagnosis not present

## 2018-08-21 IMAGING — CT CT HEAD W/O CM
3 series · 16 of 47 positions shown, 19 images · non-contrast
Comparison: None.

CLINICAL DATA: Dizziness with nausea and vomiting after attempting
to clean out left ear with warm solution.

EXAM:
CT HEAD WITHOUT CONTRAST
TECHNIQUE: Contiguous axial images were obtained from the base of the skull
through the vertex without intravenous contrast.

[Series 2: head wo · axial · 0.50mm/px · z∈[-200,-75]mm · 10 of 30 slices shown, 13 images]
[im 3/30  brain]
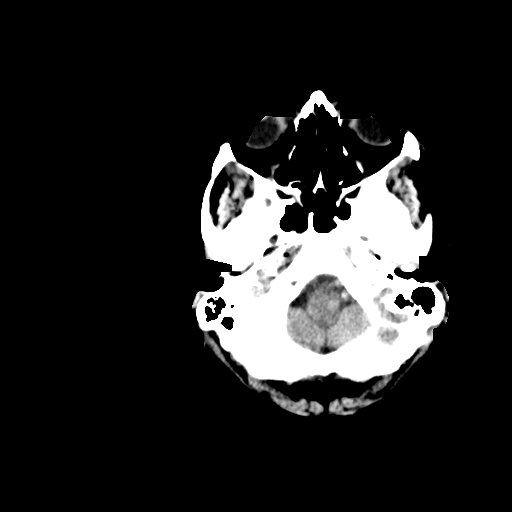
[im 3/30  bone]
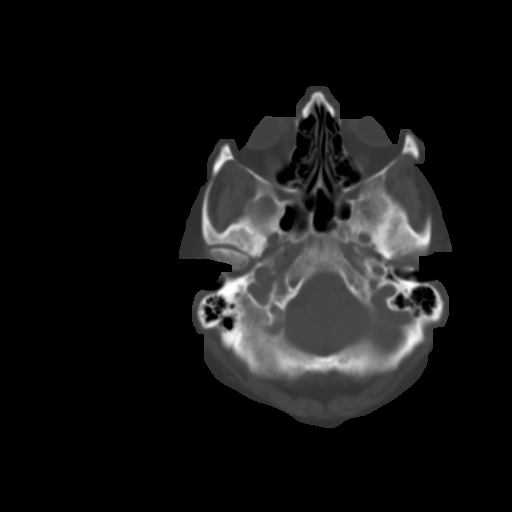
[im 6/30  brain]
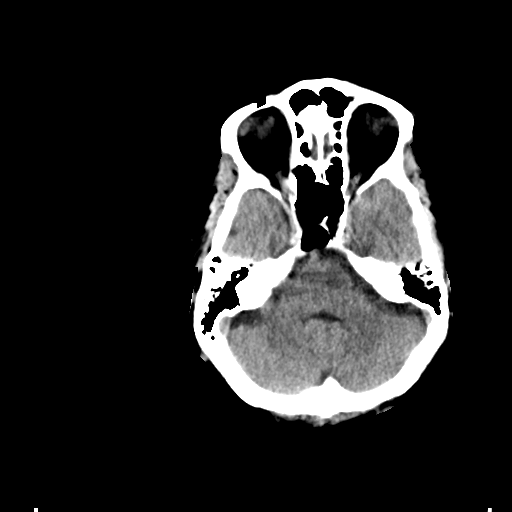
[im 9/30  brain]
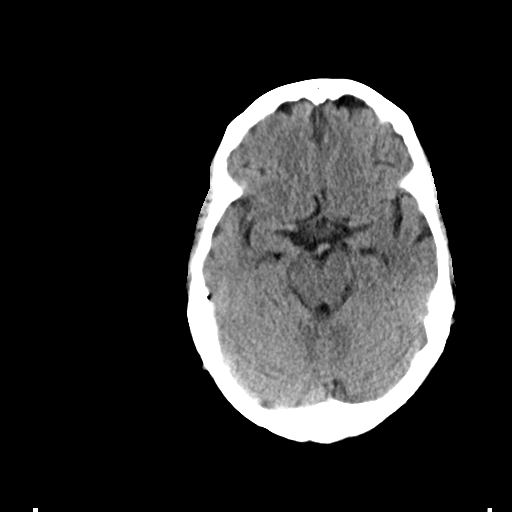
[im 11/30  brain]
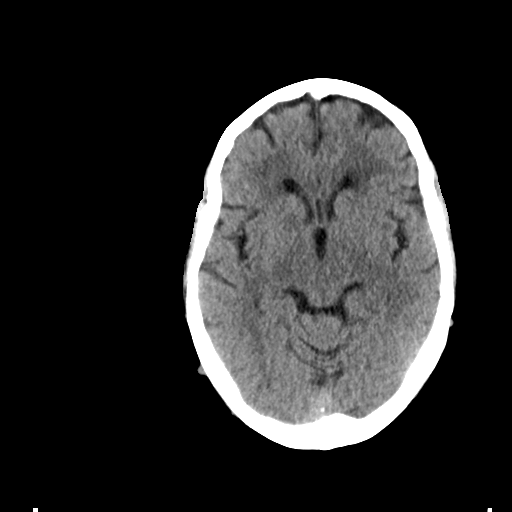
[im 14/30  brain]
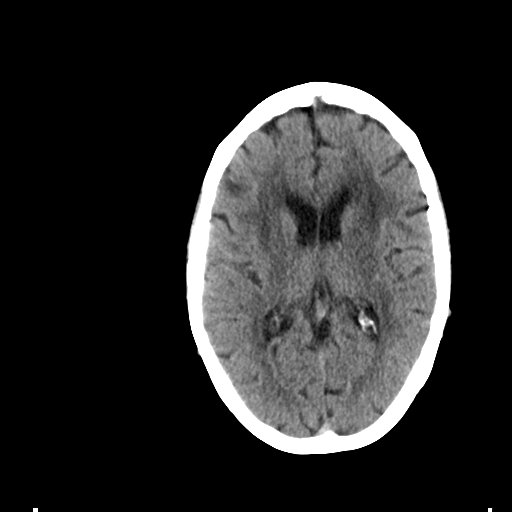
[im 14/30  bone]
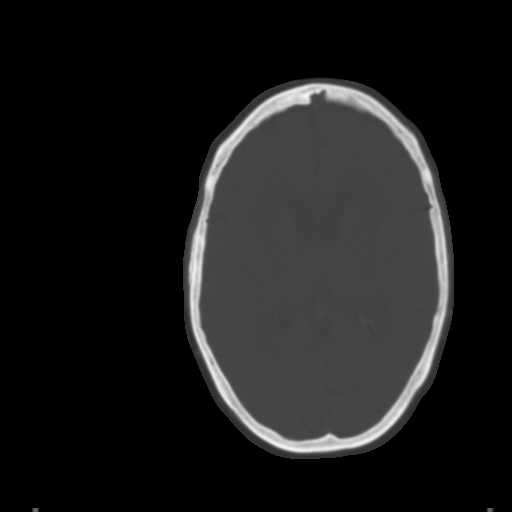
[im 17/30  brain]
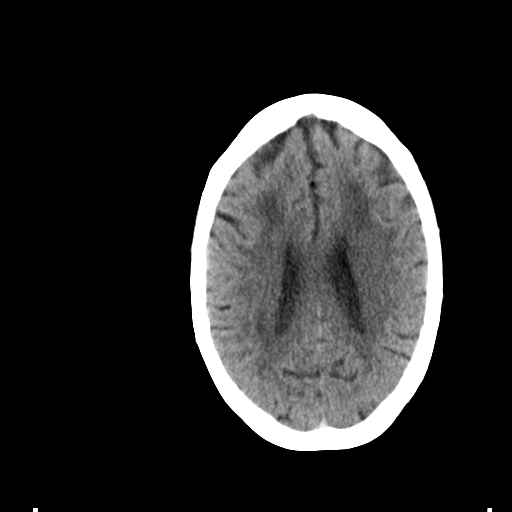
[im 20/30  brain]
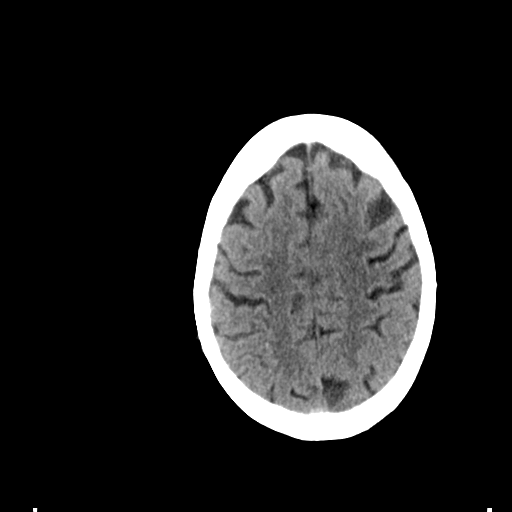
[im 23/30  brain]
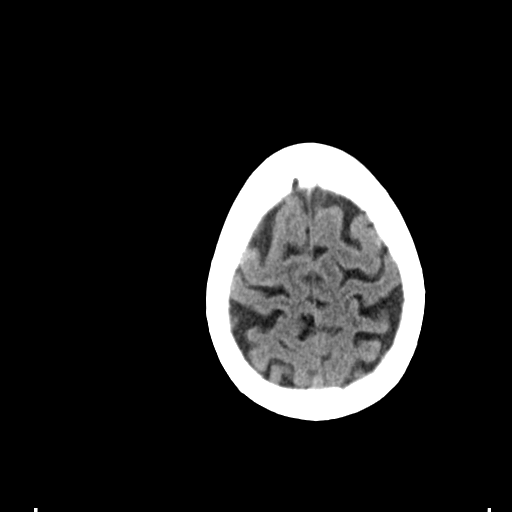
[im 25/30  brain]
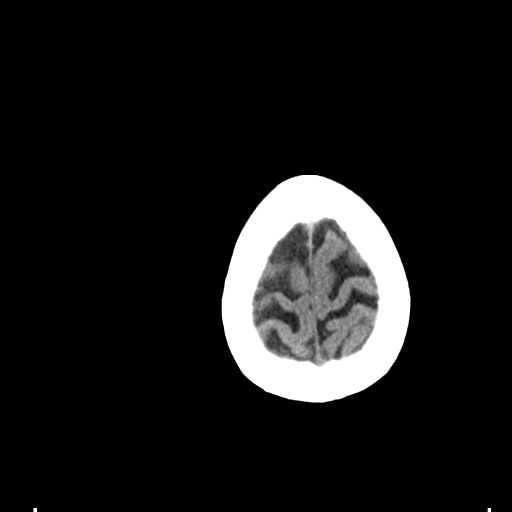
[im 25/30  bone]
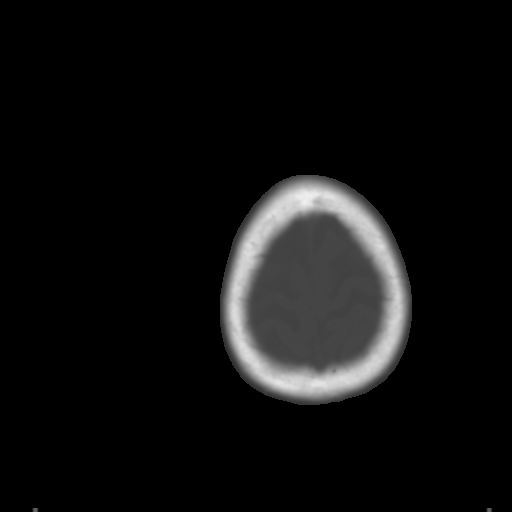
[im 28/30  brain]
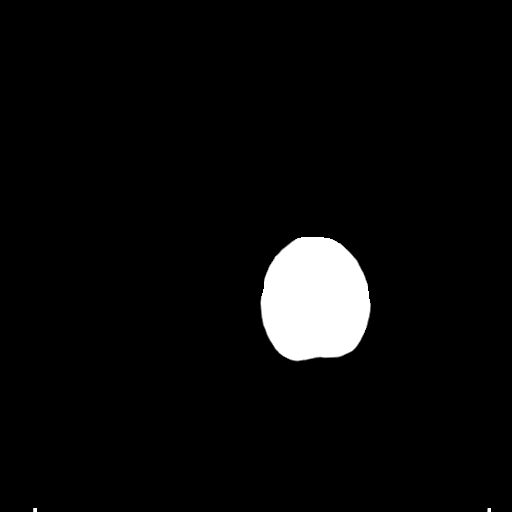

[Series 4: coronal soft tissue · coronal · 0.28mm/px · 3 of 67 slices shown]
[im 23/67  brain]
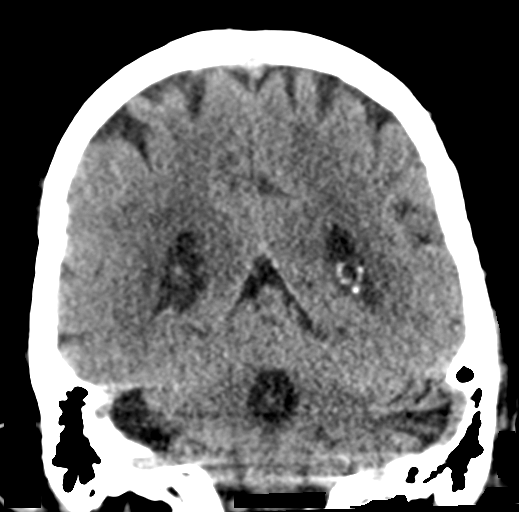
[im 30/67  brain]
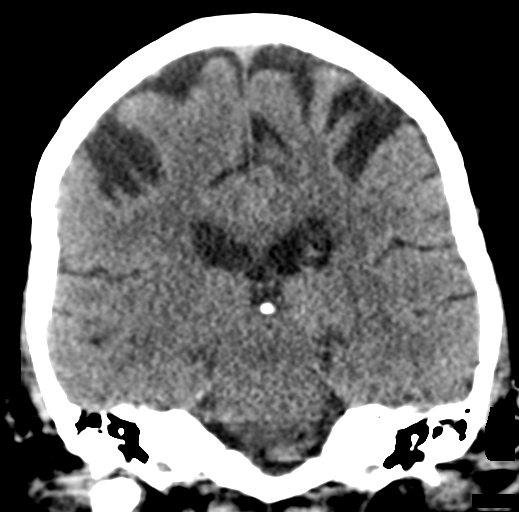
[im 37/67  brain]
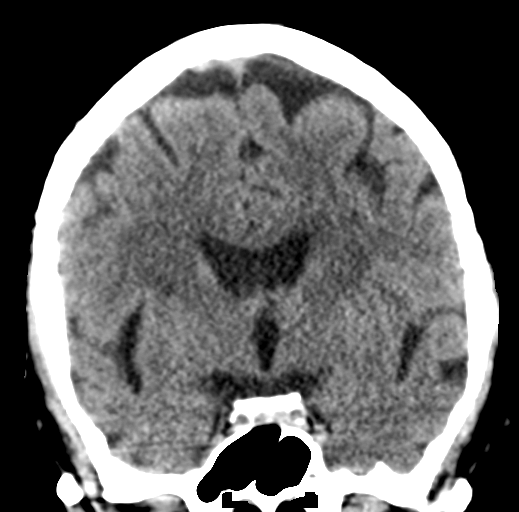

[Series 5: sagittal soft tissue · sagittal · 0.29mm/px · 3 of 48 slices shown]
[im 16/48  brain]
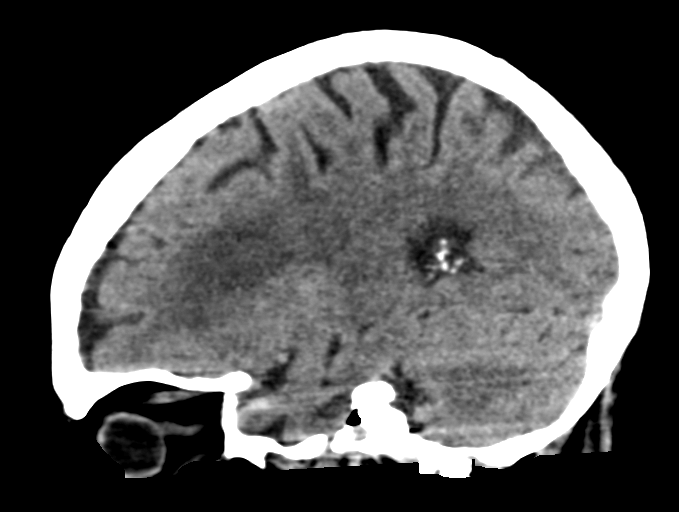
[im 24/48  brain]
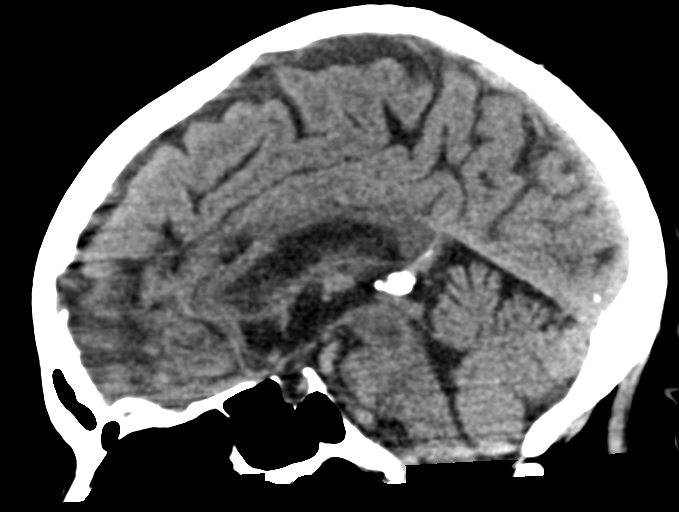
[im 32/48  brain]
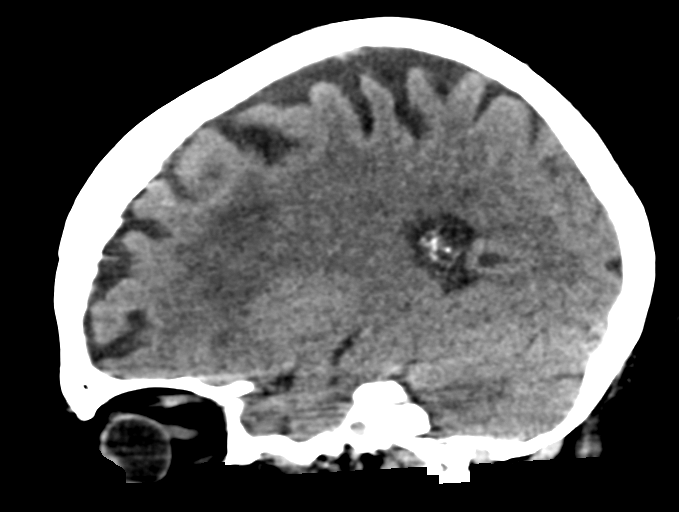

[16 of 47 positions shown; findings below may reference images not displayed]

FINDINGS: Brain: Ventricles, cisterns and other CSF spaces are within normal.
There is moderate chronic ischemic microvascular disease present.
There is no mass, mass effect, shift of midline structures or acute
hemorrhage. No evidence of acute infarction.

Vascular: No hyperdense vessel or unexpected calcification.

Skull: Normal. Negative for fracture or focal lesion. Subtle focal
opacification of the left external auditory canal.

Sinuses/Orbits: No acute finding.

Other: None.
IMPRESSION: No acute intracranial findings.

Chronic ischemic microvascular disease.

## 2018-08-23 DIAGNOSIS — H6500 Acute serous otitis media, unspecified ear: Secondary | ICD-10-CM | POA: Diagnosis not present

## 2018-09-05 ENCOUNTER — Ambulatory Visit: Payer: Self-pay | Admitting: *Deleted

## 2018-09-05 NOTE — Chronic Care Management (AMB) (Signed)
°  Chronic Care Management   Outreach Note  09/05/2018 Name: Candace Tapia MRN: [id number] DOB: 1936/03/22  Referred by: Olin Hauser, DO Reason for referral : No chief complaint on file.   An unsuccessful telephone outreach was attempted today. The patient was referred to the case management team by for assistance with chronic care management and care coordination.   Follow Up Plan: The care management team is available to follow up with the patient after provider conversation with the patient regarding recommendation for care management engagement and subsequent re-referral to the care management team.   [place]  [email or web address]   ??[phone]

## 2018-09-06 DIAGNOSIS — H6500 Acute serous otitis media, unspecified ear: Secondary | ICD-10-CM | POA: Diagnosis not present

## 2018-09-06 DIAGNOSIS — H903 Sensorineural hearing loss, bilateral: Secondary | ICD-10-CM | POA: Diagnosis not present

## 2018-09-21 DIAGNOSIS — J449 Chronic obstructive pulmonary disease, unspecified: Secondary | ICD-10-CM | POA: Diagnosis not present

## 2018-10-02 ENCOUNTER — Other Ambulatory Visit: Payer: Self-pay

## 2018-10-02 ENCOUNTER — Encounter: Payer: Self-pay | Admitting: Emergency Medicine

## 2018-10-02 ENCOUNTER — Emergency Department
Admission: EM | Admit: 2018-10-02 | Discharge: 2018-10-02 | Disposition: A | Discharge status: Home / Self Care | Payer: Medicare Other | Attending: Emergency Medicine | Admitting: Emergency Medicine

## 2018-10-02 DIAGNOSIS — R109 Unspecified abdominal pain: Secondary | ICD-10-CM | POA: Diagnosis not present

## 2018-10-02 DIAGNOSIS — Z5321 Procedure and treatment not carried out due to patient leaving prior to being seen by health care provider: Secondary | ICD-10-CM | POA: Insufficient documentation

## 2018-10-02 LAB — COMPREHENSIVE METABOLIC PANEL
ALT: 16 U/L (ref 0–44)
AST: 19 U/L (ref 15–41)
Albumin: 3.7 g/dL (ref 3.5–5.0)
Alkaline Phosphatase: 73 U/L (ref 38–126)
Anion gap: 12 (ref 5–15)
BUN: 14 mg/dL (ref 8–23)
CO2: 30 mmol/L (ref 22–32)
Calcium: 9.4 mg/dL (ref 8.9–10.3)
Chloride: 101 mmol/L (ref 98–111)
Creatinine, Ser: 0.9 mg/dL (ref 0.44–1.00)
GFR calc Af Amer: >60 mL/min (ref >60)
GFR calc non Af Amer: 60 mL/min — ABNORMAL LOW (ref >60)
Glucose, Bld: 152 mg/dL — ABNORMAL HIGH (ref 70–99)
Potassium: 3.5 mmol/L (ref 3.5–5.1)
Sodium: 143 mmol/L (ref 135–145)
Total Bilirubin: 0.4 mg/dL (ref 0.3–1.2)
Total Protein: 7.1 g/dL (ref 6.5–8.1)

## 2018-10-02 LAB — LIPASE, BLOOD: Lipase: 23 U/L (ref 11–51)

## 2018-10-02 LAB — CBC
HCT: 41.4 % (ref 36.0–46.0)
Hemoglobin: 13.1 g/dL (ref 12.0–15.0)
MCH: 29.4 pg (ref 26.0–34.0)
MCHC: 31.6 g/dL (ref 30.0–36.0)
MCV: 92.8 fL (ref 80.0–100.0)
Platelets: 290 10*3/uL (ref 150–400)
RBC: 4.46 MIL/uL (ref 3.87–5.11)
RDW: 13.3 % (ref 11.5–15.5)
WBC: 8.9 10*3/uL (ref 4.0–10.5)
nRBC: 0 % (ref 0.0–0.2)

## 2018-10-02 MED ORDER — SODIUM CHLORIDE 0.9% FLUSH: 3.0000 mL | Freq: Once | INTRAVENOUS | Status: DC

## 2018-10-02 NOTE — ED Triage Notes (Signed)
Pt to ER with c/o left "side" pain.  States she had a similar pain on the right last year and it was pneumonia.  Pt states hx of COPD and she has not noted an increase in her [facility].  Pt states new congestion with cough, but her cough is nonproductive.

## 2018-10-03 ENCOUNTER — Ambulatory Visit
Admission: EM | Admit: 2018-10-03 | Discharge: 2018-10-03 | Disposition: A | Discharge status: Home / Self Care | Payer: Medicare Other | Attending: Emergency Medicine | Admitting: Emergency Medicine

## 2018-10-03 ENCOUNTER — Encounter: Payer: Self-pay | Admitting: Emergency Medicine

## 2018-10-03 ENCOUNTER — Ambulatory Visit (INDEPENDENT_AMBULATORY_CARE_PROVIDER_SITE_OTHER): Payer: Medicare Other

## 2018-10-03 ENCOUNTER — Other Ambulatory Visit: Payer: Self-pay

## 2018-10-03 ENCOUNTER — Telehealth: Payer: Self-pay | Admitting: *Deleted

## 2018-10-03 ENCOUNTER — Telehealth: Payer: Self-pay

## 2018-10-03 DIAGNOSIS — M549 Dorsalgia, unspecified: Secondary | ICD-10-CM

## 2018-10-03 DIAGNOSIS — J441 Chronic obstructive pulmonary disease with (acute) exacerbation: Secondary | ICD-10-CM

## 2018-10-03 DIAGNOSIS — Z20822 Contact with and (suspected) exposure to covid-19: Secondary | ICD-10-CM

## 2018-10-03 DIAGNOSIS — R05 Cough: Secondary | ICD-10-CM

## 2018-10-03 DIAGNOSIS — Z87891 Personal history of nicotine dependence: Secondary | ICD-10-CM | POA: Diagnosis not present

## 2018-10-03 DIAGNOSIS — R059 Cough, unspecified: Secondary | ICD-10-CM

## 2018-10-03 DIAGNOSIS — Z7189 Other specified counseling: Secondary | ICD-10-CM

## 2018-10-03 DIAGNOSIS — R0602 Shortness of breath: Secondary | ICD-10-CM | POA: Diagnosis not present

## 2018-10-03 LAB — URINALYSIS, COMPLETE (UACMP) WITH MICROSCOPIC
Bilirubin Urine: NEGATIVE
Glucose, UA: NEGATIVE mg/dL
Ketones, ur: NEGATIVE mg/dL
Nitrite: NEGATIVE
Protein, ur: 30 mg/dL — AB
Specific Gravity, Urine: 1.025 (ref 1.005–1.030)
pH: 5.5 (ref 5.0–8.0)

## 2018-10-03 MED ORDER — PREDNISONE 20 MG PO TABS: 40.0000 mg | ORAL_TABLET | Freq: Every day | ORAL | 0 refills | Status: DC

## 2018-10-03 MED ORDER — DOXYCYCLINE HYCLATE 100 MG PO CAPS: 100.0000 mg | ORAL_CAPSULE | Freq: Two times a day (BID) | ORAL | 0 refills | Status: DC

## 2018-10-03 MED ORDER — BENZONATATE 100 MG PO CAPS: 100.0000 mg | ORAL_CAPSULE | Freq: Three times a day (TID) | ORAL | 0 refills | Status: DC | PRN

## 2018-10-03 NOTE — Telephone Encounter (Addendum)
Patient called, left VM to return the call to [phone] between the hours 0700-1900 Monday through Friday to schedule for covid testing.   ----- Message from Marylene Land, NP sent at 10/03/2018 10:45 AM EDT ----- Regarding: Covid 19 testing Cough, congestion, hx copd

## 2018-10-03 NOTE — ED Provider Notes (Addendum)
[facility] ____________________________________________  Time seen: Approximately 10:36 AM  I have reviewed the triage vital signs and the nursing notes.   HISTORY  Chief Complaint Back Pain (left sided) and Cough  HPI Candace Tapia is a 83 y.o. female past medical history of COPD, presenting for evaluation of cough and congestion as well as left lower back pain present for over 1 week.  States the back pain present for approximately 1 week, but reports cough congestion present for 1 to 2 weeks.  Does report she has intermittently noticed wheezing at home with some increased shortness of breath when she is up and active.  States this is not uncommon or new for her.  Denies any current shortness of breath.  No wheezing today.  Does report she has not been using her inhalers as she knows she is supposed to. States cough is often productive with whitish mucous. States her left lower back pain is present when she moves, but also noticed with coughing.  States that she wanted to make sure she did not have pneumonia as she had some similar pain in her right back previously with pneumonia.  Denies known fevers.  Denies known sick contacts.  Denies nasal congestion or sore throat.  Continues to eat and drink well.  Denies chest pain.  Denies dysuria or hematuria.  Reports otherwise doing well.  Denies alleviating measures.  Olin Hauser, DO: PCP   Past Medical History:  Diagnosis Date  . Allergy   . Arthritis   . Breast cancer ([place]) 2014   radiation and taking exemestane  . Cancer of left breast ([place]) 10/27/2014  . GERD (gastroesophageal reflux disease)   . H/O: hysterectomy   . History of lumpectomy 2014   LT breast    Patient Active Problem List   Diagnosis Date Noted  . Peripheral polyneuropathy 12/27/2017  . Vestibular disorder 03/20/2017  . CKD (chronic kidney disease), stage III ([place]) 12/09/2016  . Chronic venous insufficiency 11/25/2016  . Swelling of  limb 11/01/2016  . Bilateral lower extremity edema 10/27/2016  . Pre-diabetes 08/12/2016  . Carotid bruit 02/09/2016  . History of left breast cancer 02/09/2016  . Long term current use of opiate analgesic 02/08/2016  . Long term prescription opiate use 02/08/2016  . Drug level above therapeutic range 02/08/2016  . Chronic low back pain (Location of Tertiary source of pain) (Bilateral) (R>L) 02/08/2016  . Chronic knee pain (Location of Secondary source of pain) (Bilateral) (L>R) 02/08/2016  . History bilateral shoulder replacement 02/08/2016  . Chronic hip pain (Location of Primary Source of Pain) (Bilateral) (R>L) 01/07/2016  . Complete rupture of rotator cuff 01/07/2016  . Hyperlipidemia 01/07/2016  . Osteopenia 01/07/2016  . COPD (chronic obstructive pulmonary disease) with chronic bronchitis ([place]) 07/21/2015  . DDD (degenerative disc disease), lumbar 03/05/2015  . Lumbar facet syndrome (Bilateral) (R>L) 03/05/2015  . Sacroiliac joint dysfunction 03/05/2015  . Osteoarthritis of hip (Bilateral) (R>L) 03/05/2015  . DJD of shoulder 03/05/2015  . Abnormal finding on mammography 03/03/2015  . Back pain with radiation 03/03/2015  . Major depression in full remission ([place]) 03/03/2015  . Dry skin 03/03/2015  . Myalgia due to statin 03/03/2015  . Gastro-esophageal reflux disease without esophagitis 03/03/2015  . Allergic rhinitis, seasonal 03/03/2015  . Osteoarthritis of multiple joints 03/03/2015  . Essential hypertension 11/28/2014  . Chronic pain 11/28/2014  . Vitamin D deficiency 11/28/2014  . Airway hyperreactivity 09/18/2013  . Microscopic hematuria 12/20/2011    Past Surgical History:  Procedure Laterality Date  . ABDOMINAL HYSTERECTOMY    . BREAST LUMPECTOMY Left 2014  . BREAST SURGERY Left 2014  . JOINT REPLACEMENT    . LYMPH NODE DISSECTION     neck  . TOTAL SHOULDER REPLACEMENT Bilateral 2010 and 2013     No current facility-administered medications for this  encounter.   Current Outpatient Medications:  .  albuterol (PROAIR HFA) 108 (90 BASE) MCG/ACT inhaler, Inhale 1 puff into the lungs every 6 (six) hours as needed. , Disp: , Rfl:  .  amLODipine (NORVASC) 5 MG tablet, Take 1 tablet (5 mg total) by mouth daily., Disp: 90 tablet, Rfl: 1 .  aspirin 81 MG chewable tablet, Chew 162 mg by mouth 2 (two) times daily., Disp: , Rfl:  .  chlorthalidone (HYGROTON) 25 MG tablet, TAKE 1 TABLET BY MOUTH EVERY DAY, Disp: 90 tablet, Rfl: 3 .  Cholecalciferol (KP VITAMIN D3) 25 MCG (1000 UT) capsule, Vitamin D3 25 mcg (1,000 unit) capsule  1 po qd, Disp: , Rfl:  .  fexofenadine (ALLEGRA ALLERGY) 180 MG tablet, Take 180 mg by mouth as needed. Reported on 09/29/2015, Disp: , Rfl:  .  fluticasone (FLONASE) 50 MCG/ACT nasal spray, Place 1 spray into both nostrils as needed. , Disp: , Rfl:  .  fluticasone-salmeterol (ADVAIR HFA) 115-21 MCG/ACT inhaler, Inhale 2 puffs into the lungs 2 (two) times daily., Disp: 1 Inhaler, Rfl: 1 .  gabapentin (NEURONTIN) 400 MG capsule, Take 1 capsule (400 mg total) by mouth 2 (two) times daily., Disp: 180 capsule, Rfl: 1 .  losartan (COZAAR) 100 MG tablet, Take 1 tablet (100 mg total) by mouth daily., Disp: 90 tablet, Rfl: 1 .  Multiple Vitamins-Minerals (MULTIVITAMIN WOMEN 50+) TABS, Take 2 tablets by mouth daily., Disp: , Rfl:  .  NEXIUM 20 MG capsule, Take 1 capsule (20 mg total) by mouth daily., Disp: 90 capsule, Rfl: 3 .  Potassium Chloride ER 20 MEQ TBCR, Take 20 mEq by mouth daily., Disp: 90 tablet, Rfl: 3 .  acetaminophen (TYLENOL) 650 MG CR tablet, Take 650 mg by mouth every 8 (eight) hours as needed for pain., Disp: , Rfl:  .  benzonatate (TESSALON PERLES) 100 MG capsule, Take 1 capsule (100 mg total) by mouth 3 (three) times daily as needed for cough., Disp: 15 capsule, Rfl: 0 .  doxycycline (VIBRAMYCIN) 100 MG capsule, Take 1 capsule (100 mg total) by mouth 2 (two) times daily., Disp: 20 capsule, Rfl: 0 .  meclizine (ANTIVERT)  25 MG tablet, Take 0.5 tablets (12.5 mg total) by mouth 3 (three) times daily as needed for dizziness., Disp: 30 tablet, Rfl: 0 .  predniSONE (DELTASONE) 20 MG tablet, Take 2 tablets (40 mg total) by mouth daily., Disp: 10 tablet, Rfl: 0  Allergies Tramadol hcl, Statins, Penicillin g, Penicillins, and Sulfa antibiotics  Family History  Problem Relation Age of Onset  . Breast cancer Mother 73  . Diabetes Mother   . Kidney disease Father   . Diabetes Father   . Breast cancer Sister 9    Social History Social History   Tobacco Use  . Smoking status: Former Smoker    Packs/day: 2.00    Years: 40.00    Pack years: 80.00    Quit date: 04/19/1993    Years since quitting: 25.4  . Smokeless tobacco: Never Used  Substance Use Topics  . Alcohol use: No    Alcohol/week: 0.0 standard drinks  . Drug use: No    Review of Systems  Constitutional: No fever ENT: No sore throat. Cardiovascular: Denies chest pain. Respiratory: As above.  Gastrointestinal: No abdominal pain.  No nausea, no vomiting.  No diarrhea.   Genitourinary: Negative for dysuria. Musculoskeletal: Positive for back pain. Skin: Negative for rash. Neurological: Negative for focal weakness or numbness.    ____________________________________________   PHYSICAL EXAM:  VITAL SIGNS: ED Triage Vitals  Enc Vitals Group     BP 10/03/18 1015 120/62     Pulse Rate 10/03/18 1015 94     Resp 10/03/18 1015 17     Temp 10/03/18 1015 98.1 F (36.7 C)     Temp Source 10/03/18 1015 Oral     SpO2 10/03/18 1015 98 %     Weight 10/03/18 1010 214 lb (97.1 kg)     Height 10/03/18 1010 5' 5.5" (1.664 m)     Head Circumference --      Peak Flow --      Pain Score 10/03/18 1010 0     Pain Loc --      Pain Edu? --      Excl. in [place]? --     Constitutional: Alert and oriented. Well appearing and in no acute distress. Eyes: Conjunctivae are normal.  ENT      Head: Normocephalic and atraumatic.      Nose: No congestion       Mouth/Throat: Mucous membranes are moist. Cardiovascular: Normal rate, regular rhythm. Grossly normal heart sounds.  Good peripheral circulation. Respiratory: Normal respiratory effort without tachypnea nor retractions. Breath sounds are clear and equal bilaterally. No wheezes, rales, rhonchi.  Intermittent dry cough noted in room. Gastrointestinal: Soft and nontender. No distention.  Musculoskeletal: Steady gait.  Left lower flank to left paralumbar mild tenderness to palpation, no midline tenderness, pain fully reproducible by direct palpation per patient, no rash.  Neurologic:  Normal speech and language. Speech is normal.  Skin:  Skin is warm, dry and intact. No rash noted. Psychiatric: Mood and affect are normal. Speech and behavior are normal. Patient exhibits appropriate insight and judgment   ___________________________________________   LABS (all labs ordered are listed, but only abnormal results are displayed)  Labs Reviewed  URINALYSIS, COMPLETE (UACMP) WITH MICROSCOPIC - Abnormal; Notable for the following components:      Result Value   APPearance HAZY (*)    Hgb urine dipstick SMALL (*)    Protein, ur 30 (*)    Leukocytes,Ua TRACE (*)    Bacteria, UA RARE (*)    All other components within normal limits  URINE CULTURE   ____________________________________________  RADIOLOGY  Dg Chest 2 View  Result Date: 10/03/2018 CLINICAL DATA:  Productive cough and shortness of breath. History of COPD. EXAM: CHEST - 2 VIEW COMPARISON:  Chest radiographs and CTA 05/13/2017 FINDINGS: The cardiomediastinal silhouette is unchanged with normal heart size. The lungs are hyperinflated. No acute airspace consolidation, edema, pleural effusion, pneumothorax is identified. There is minimal chronic posterior basilar opacity on the lateral radiograph. Bilateral shoulder arthroplasties are noted. IMPRESSION: COPD without evidence of active cardiopulmonary disease. Electronically Signed   By: Logan Bores M.D.   On: 10/03/2018 11:09   ____________________________________________   PROCEDURES Procedures    INITIAL IMPRESSION / ASSESSMENT AND PLAN / ED COURSE  Pertinent labs & imaging results that were available during my care of the patient were reviewed by me and considered in my medical decision making (see chart for details).  Well-appearing patient.  No acute distress.  Patient with history of  COPD with increased coughing congestion over the last 1 to 2 weeks with intermittent wheezing at home.  Also reports left lower back pain for 1 week.  Suspect COPD exacerbation with musculoskeletal strain.  Will evaluate chest x-ray as well as urinalysis.  Chest x-ray as above per radiologist, no acute cardiopulmonary disease COPD.  Urinalysis reviewed, suspect contamination, will culture.  Will treat with oral doxycycline, prednisone and PRN Tessalon Perles.  Encourage supportive care and close monitoring.  COVID-19 tests ordered and directed.  [place] DHHS information given and directed patient to follow.  Follow-up with primary care.Discussed indication, risks and benefits of medications with patient.  Discussed follow up with Primary care physician this week. Discussed follow up and return parameters including no resolution or any worsening concerns. Patient verbalized understanding and agreed to plan.   ____________________________________________   FINAL CLINICAL IMPRESSION(S) / ED DIAGNOSES  Final diagnoses:  COPD exacerbation ([place])  Cough  Acute left-sided back pain, unspecified back location  Advice Given About Covid-19 Virus Infection     ED Discharge Orders         Ordered    predniSONE (DELTASONE) 20 MG tablet  Daily     10/03/18 1114    doxycycline (VIBRAMYCIN) 100 MG capsule  2 times daily     10/03/18 1114    benzonatate (TESSALON PERLES) 100 MG capsule  3 times daily PRN     10/03/18 1114           Note: This dictation was prepared with Dragon dictation along  with smaller phrase technology. Any transcriptional errors that result from this process are unintentional.         Marylene Land, NP 10/03/18 1132    Marylene Land, NP 10/03/18 1133

## 2018-10-03 NOTE — Telephone Encounter (Signed)
-----   Message from Marylene Land, NP sent at 10/03/2018 10:45 AM EDT ----- Regarding: Covid 19 testing Cough, congestion, hx copd

## 2018-10-03 NOTE — Telephone Encounter (Signed)
Patient called to schedule covid test, appointment scheduled for tomorrow at 1300 at [facility], advised to wear a mask for everyone in the vehicle and location given.

## 2018-10-03 NOTE — ED Triage Notes (Signed)
Patient c/o left sided mid back pain that started over a week ago.  Patient does reports cough and SOB.  Patient has history of COPD.  Patient denies fevers.

## 2018-10-03 NOTE — Telephone Encounter (Signed)
Attempted to call patient to get her scheduled for the covid-19 test, no answer. Left message  for patient to call [phone] and ask for a triage nurse to be scheduled. Order has been placed for covid-19 testing.

## 2018-10-03 NOTE — Discharge Instructions (Addendum)
Take medication as prescribed. Rest. Drink plenty of fluids. Use your home inhalers as directed. Complete Covid 19 testing today. Please refer [place] DHHS guidelines given and follow.   Follow up with your primary care physician this week. Return to Urgent care or Emergency room for chest pain, shortness of breath, new or worsening concerns.

## 2018-10-04 ENCOUNTER — Ambulatory Visit: Payer: Medicare Other | Admitting: Family Medicine

## 2018-10-04 ENCOUNTER — Other Ambulatory Visit: Payer: Medicare Other

## 2018-10-04 DIAGNOSIS — R6889 Other general symptoms and signs: Secondary | ICD-10-CM | POA: Diagnosis not present

## 2018-10-04 DIAGNOSIS — Z20822 Contact with and (suspected) exposure to covid-19: Secondary | ICD-10-CM

## 2018-10-04 LAB — URINE CULTURE: Culture: NO GROWTH

## 2018-10-06 LAB — NOVEL CORONAVIRUS, NAA: SARS-CoV-2, NAA: NOT DETECTED

## 2018-10-08 ENCOUNTER — Telehealth (HOSPITAL_COMMUNITY): Payer: Self-pay | Admitting: Emergency Medicine

## 2018-10-08 NOTE — Telephone Encounter (Signed)
Your test for COVID-19 was negative.  Please continue good preventive care measures, including:  frequent hand-washing, avoid touching your face, cover coughs/sneezes, stay out of crowds and keep a 6 foot distance from others.  If you develop fever/cough/breathlessness, please stay home for 10 days and until you have had 3 consecutive days with cough/breathlessness improving and without fever (without taking a fever reducer). Go to the nearest hospital ED tent for assessment if fever/cough/breathlessness are severe or illness seems like a threat to life.  Attempted to reach patient. No answer at this time. Busy signal

## 2018-10-21 DIAGNOSIS — J449 Chronic obstructive pulmonary disease, unspecified: Secondary | ICD-10-CM | POA: Diagnosis not present

## 2018-11-21 DIAGNOSIS — J449 Chronic obstructive pulmonary disease, unspecified: Secondary | ICD-10-CM | POA: Diagnosis not present

## 2018-12-22 DIAGNOSIS — J449 Chronic obstructive pulmonary disease, unspecified: Secondary | ICD-10-CM | POA: Diagnosis not present

## 2019-01-01 ENCOUNTER — Other Ambulatory Visit: Payer: Self-pay

## 2019-01-01 ENCOUNTER — Ambulatory Visit (INDEPENDENT_AMBULATORY_CARE_PROVIDER_SITE_OTHER): Payer: Medicare Other

## 2019-01-01 VITALS — BP 126/72 | HR 91 | Temp 97.8°F | Resp 17 | Ht 65.5 in | Wt 216.6 lb

## 2019-01-01 DIAGNOSIS — Z0000 Encounter for general adult medical examination without abnormal findings: Secondary | ICD-10-CM | POA: Diagnosis not present

## 2019-01-01 DIAGNOSIS — Z23 Encounter for immunization: Secondary | ICD-10-CM

## 2019-01-01 NOTE — Patient Instructions (Signed)
[name] , Thank you for taking time to come for your Medicare Wellness Visit. I appreciate your ongoing commitment to your health goals. Please review the following plan we discussed and let me know if I can assist you in the future.   Screening recommendations/referrals: Colonoscopy: no longer required Mammogram: no longer required Bone Density: up to date Recommended yearly ophthalmology/optometry visit for glaucoma screening and checkup Recommended yearly dental visit for hygiene and checkup  Vaccinations: Influenza vaccine: done today  Pneumococcal vaccine: up to date Tdap vaccine: up to date Shingles vaccine: shingrix eligible    Advanced directives: Please bring a copy of your health care power of attorney and living will to the office at your convenience.  Conditions/risks identified: discussed chronic care management team, someone will contact you.   Next appointment: Follow up in one year for your annual wellness visit.    Preventive Care 49 Years and Older, Female Preventive care refers to lifestyle choices and visits with your health care provider that can promote health and wellness. What does preventive care include?  A yearly physical exam. This is also called an annual well check.  Dental exams once or twice a year.  Routine eye exams. Ask your health care provider how often you should have your eyes checked.  Personal lifestyle choices, including:  Daily care of your teeth and gums.  Regular physical activity.  Eating a healthy diet.  Avoiding tobacco and drug use.  Limiting alcohol use.  Practicing safe sex.  Taking low-dose aspirin every day.  Taking vitamin and mineral supplements as recommended by your health care provider. What happens during an annual well check? The services and screenings done by your health care provider during your annual well check will depend on your age, overall health, lifestyle risk factors, and family history of  disease. Counseling  Your health care provider may ask you questions about your:  Alcohol use.  Tobacco use.  Drug use.  Emotional well-being.  Home and relationship well-being.  Sexual activity.  Eating habits.  History of falls.  Memory and ability to understand (cognition).  Work and work Statistician.  Reproductive health. Screening  You may have the following tests or measurements:  Height, weight, and BMI.  Blood pressure.  Lipid and cholesterol levels. These may be checked every 5 years, or more frequently if you are over 50 years old.  Skin check.  Lung cancer screening. You may have this screening every year starting at age 42 if you have a 30-pack-year history of smoking and currently smoke or have quit within the past 15 years.  Fecal occult blood test (FOBT) of the stool. You may have this test every year starting at age 36.  Flexible sigmoidoscopy or colonoscopy. You may have a sigmoidoscopy every 5 years or a colonoscopy every 10 years starting at age 69.  Hepatitis C blood test.  Hepatitis B blood test.  Sexually transmitted disease (STD) testing.  Diabetes screening. This is done by checking your blood sugar (glucose) after you have not eaten for a while (fasting). You may have this done every 1-3 years.  Bone density scan. This is done to screen for osteoporosis. You may have this done starting at age 72.  Mammogram. This may be done every 1-2 years. Talk to your health care provider about how often you should have regular mammograms. Talk with your health care provider about your test results, treatment options, and if necessary, the need for more tests. Vaccines  Your health  care provider may recommend certain vaccines, such as:  Influenza vaccine. This is recommended every year.  Tetanus, diphtheria, and acellular pertussis (Tdap, Td) vaccine. You may need a Td booster every 10 years.  Zoster vaccine. You may need this after age 51.   Pneumococcal 13-valent conjugate (PCV13) vaccine. One dose is recommended after age 12.  Pneumococcal polysaccharide (PPSV23) vaccine. One dose is recommended after age 92. Talk to your health care provider about which screenings and vaccines you need and how often you need them. This information is not intended to replace advice given to you by your health care provider. Make sure you discuss any questions you have with your health care provider. Document Released: 05/01/2015 Document Revised: 12/23/2015 Document Reviewed: 02/03/2015 Elsevier Interactive Patient Education  2017 [place] Prevention in the Home Falls can cause injuries. They can happen to people of all ages. There are many things you can do to make your home safe and to help prevent falls. What can I do on the outside of my home?  Regularly fix the edges of walkways and driveways and fix any cracks.  Remove anything that might make you trip as you walk through a door, such as a raised step or threshold.  Trim any bushes or trees on the path to your home.  Use bright outdoor lighting.  Clear any walking paths of anything that might make someone trip, such as rocks or tools.  Regularly check to see if handrails are loose or broken. Make sure that both sides of any steps have handrails.  Any raised decks and porches should have guardrails on the edges.  Have any leaves, snow, or ice cleared regularly.  Use sand or salt on walking paths during winter.  Clean up any spills in your garage right away. This includes oil or grease spills. What can I do in the bathroom?  Use night lights.  Install grab bars by the toilet and in the tub and shower. Do not use towel bars as grab bars.  Use non-skid mats or decals in the tub or shower.  If you need to sit down in the shower, use a plastic, non-slip stool.  Keep the floor dry. Clean up any water that spills on the floor as soon as it happens.  Remove soap  buildup in the tub or shower regularly.  Attach bath mats securely with double-sided non-slip rug tape.  Do not have throw rugs and other things on the floor that can make you trip. What can I do in the bedroom?  Use night lights.  Make sure that you have a light by your bed that is easy to reach.  Do not use any sheets or blankets that are too big for your bed. They should not hang down onto the floor.  Have a firm chair that has side arms. You can use this for support while you get dressed.  Do not have throw rugs and other things on the floor that can make you trip. What can I do in the kitchen?  Clean up any spills right away.  Avoid walking on wet floors.  Keep items that you use a lot in easy-to-reach places.  If you need to reach something above you, use a strong step stool that has a grab bar.  Keep electrical cords out of the way.  Do not use floor polish or wax that makes floors slippery. If you must use wax, use non-skid floor wax.  Do not have  throw rugs and other things on the floor that can make you trip. What can I do with my stairs?  Do not leave any items on the stairs.  Make sure that there are handrails on both sides of the stairs and use them. Fix handrails that are broken or loose. Make sure that handrails are as long as the stairways.  Check any carpeting to make sure that it is firmly attached to the stairs. Fix any carpet that is loose or worn.  Avoid having throw rugs at the top or bottom of the stairs. If you do have throw rugs, attach them to the floor with carpet tape.  Make sure that you have a light switch at the top of the stairs and the bottom of the stairs. If you do not have them, ask someone to add them for you. What else can I do to help prevent falls?  Wear shoes that:  Do not have high heels.  Have rubber bottoms.  Are comfortable and fit you well.  Are closed at the toe. Do not wear sandals.  If you use a stepladder:  Make  sure that it is fully opened. Do not climb a closed stepladder.  Make sure that both sides of the stepladder are locked into place.  Ask someone to hold it for you, if possible.  Clearly mark and make sure that you can see:  Any grab bars or handrails.  First and last steps.  Where the edge of each step is.  Use tools that help you move around (mobility aids) if they are needed. These include:  Canes.  Walkers.  Scooters.  Crutches.  Turn on the lights when you go into a dark area. Replace any light bulbs as soon as they burn out.  Set up your furniture so you have a clear path. Avoid moving your furniture around.  If any of your floors are uneven, fix them.  If there are any pets around you, be aware of where they are.  Review your medicines with your doctor. Some medicines can make you feel dizzy. This can increase your chance of falling. Ask your doctor what other things that you can do to help prevent falls. This information is not intended to replace advice given to you by your health care provider. Make sure you discuss any questions you have with your health care provider. Document Released: 01/29/2009 Document Revised: 09/10/2015 Document Reviewed: 05/09/2014 Elsevier Interactive Patient Education  2017 [employer].

## 2019-01-01 NOTE — Progress Notes (Signed)
Subjective:   Candace Tapia is a 83 y.o. female who presents for Medicare Annual (Subsequent) preventive examination.  Review of Systems:  Cardiac Risk Factors include: advanced age (>42men, >104 women);dyslipidemia;hypertension;obesity (BMI >30kg/m2)     Objective:     Vitals: BP 126/72 (BP Location: Left Arm, Patient Position: Sitting, Cuff Size: Normal)   Pulse 91   Temp 97.8 F (36.6 C) (Oral)   Resp 17   Ht 5' 5.5" (1.664 m)   Wt 216 lb 9.6 oz (98.2 kg)   SpO2 98%   BMI 35.50 kg/m   Body mass index is 35.5 kg/m.  Advanced Directives 01/01/2019 10/03/2018 10/02/2018 01/15/2018 12/26/2017 05/13/2017 05/13/2017  Does Patient Have a Medical Advance Directive? Yes Yes No Yes Yes Yes Yes  Type of Advance Directive Living will;Healthcare Power of [place];Living will [place] -  Does patient want to make changes to medical advance directive? - - - - - No - Patient declined No - Patient declined  Copy of [place] in Chart? No - copy requested - - - No - copy requested No - copy requested -  Would patient like information on creating a medical advance directive? - - No - Patient declined - - No - Patient declined No - Patient declined    Tobacco Social History   Tobacco Use  Smoking Status Former Smoker  . Packs/day: 2.00  . Years: 40.00  . Pack years: 80.00  . Quit date: 04/19/1993  . Years since quitting: 25.7  Smokeless Tobacco Never Used     Counseling given: Not Answered   Clinical Intake:  Pre-visit preparation completed: Yes  Pain : No/denies pain     Nutritional Status: BMI > 30  Obese Nutritional Risks: None Diabetes: No  How often do you need to have someone help you when you read instructions, pamphlets, or other written materials from your doctor or pharmacy?: 1 - Never  Interpreter Needed?: No  Information entered by ::  Tiffnay HIll,LPN  Past Medical History:  Diagnosis Date  . Allergy   . Arthritis   . Breast cancer ([place]) 2014   radiation and taking exemestane  . Cancer of left breast ([place]) 10/27/2014  . GERD (gastroesophageal reflux disease)   . H/O: hysterectomy   . History of lumpectomy 2014   LT breast   Past Surgical History:  Procedure Laterality Date  . ABDOMINAL HYSTERECTOMY    . BREAST LUMPECTOMY Left 2014  . BREAST SURGERY Left 2014  . JOINT REPLACEMENT    . LYMPH NODE DISSECTION     neck  . TOTAL SHOULDER REPLACEMENT Bilateral 2010 and 2013   Family History  Problem Relation Age of Onset  . Breast cancer Mother 65  . Diabetes Mother   . Kidney disease Father   . Diabetes Father   . Breast cancer Sister 49   Social History   Socioeconomic History  . Marital status: Widowed    Spouse name: Not on file  . Number of children: Not on file  . Years of education: Not on file  . Highest education level: 8th grade  Occupational History  . Not on file  Social Needs  . Financial resource strain: Not hard at all  . Food insecurity    Worry: Never true    Inability: Never true  . Transportation needs    Medical: No    Non-medical: No  Tobacco Use  . Smoking status: Former Smoker    Packs/day: 2.00    Years: 40.00    Pack years: 80.00    Quit date: 04/19/1993    Years since quitting: 25.7  . Smokeless tobacco: Never Used  Substance and Sexual Activity  . Alcohol use: No    Alcohol/week: 0.0 standard drinks  . Drug use: No  . Sexual activity: Never  Lifestyle  . Physical activity    Days per week: 0 days    Minutes per session: 0 min  . Stress: Not at all  Relationships  . Social Herbalist on phone: Once a week    Gets together: More than three times a week    Attends religious service: Never    Active member of club or organization: No    Attends meetings of clubs or organizations: Never    Relationship status: Widowed  Other Topics Concern  . Not on  file  Social History Narrative  . Not on file    Outpatient Encounter Medications as of 01/01/2019  Medication Sig  . acetaminophen (TYLENOL) 650 MG CR tablet Take 650 mg by mouth every 8 (eight) hours as needed for pain.  Marland Kitchen amLODipine (NORVASC) 5 MG tablet Take 1 tablet (5 mg total) by mouth daily.  Marland Kitchen aspirin 81 MG chewable tablet Chew 162 mg by mouth 2 (two) times daily.  . budesonide-formoterol (SYMBICORT) 160-4.5 MCG/ACT inhaler Inhale 2 puffs into the lungs 2 (two) times daily.  . chlorthalidone (HYGROTON) 25 MG tablet TAKE 1 TABLET BY MOUTH EVERY DAY  . fluticasone (FLONASE) 50 MCG/ACT nasal spray Place 1 spray into both nostrils as needed.   . gabapentin (NEURONTIN) 400 MG capsule Take 1 capsule (400 mg total) by mouth 2 (two) times daily.  Marland Kitchen losartan (COZAAR) 100 MG tablet Take 1 tablet (100 mg total) by mouth daily.  . meclizine (ANTIVERT) 25 MG tablet Take 0.5 tablets (12.5 mg total) by mouth 3 (three) times daily as needed for dizziness.  . Multiple Vitamins-Minerals (MULTIVITAMIN WOMEN 50+) TABS Take 2 tablets by mouth daily.  Marland Kitchen NEXIUM 20 MG capsule Take 1 capsule (20 mg total) by mouth daily.  . Potassium Chloride ER 20 MEQ TBCR Take 20 mEq by mouth daily.  Marland Kitchen albuterol (PROAIR HFA) 108 (90 BASE) MCG/ACT inhaler Inhale 1 puff into the lungs every 6 (six) hours as needed.   . Cholecalciferol (KP VITAMIN D3) 25 MCG (1000 UT) capsule Vitamin D3 25 mcg (1,000 unit) capsule  1 po qd  . fexofenadine (ALLEGRA ALLERGY) 180 MG tablet Take 180 mg by mouth as needed. Reported on 09/29/2015  . fluticasone-salmeterol (ADVAIR HFA) 115-21 MCG/ACT inhaler Inhale 2 puffs into the lungs 2 (two) times daily. (Patient not taking: Reported on 01/01/2019)  . [DISCONTINUED] benzonatate (TESSALON PERLES) 100 MG capsule Take 1 capsule (100 mg total) by mouth 3 (three) times daily as needed for cough. (Patient not taking: Reported on 01/01/2019)  . [DISCONTINUED] doxycycline (VIBRAMYCIN) 100 MG capsule Take 1  capsule (100 mg total) by mouth 2 (two) times daily. (Patient not taking: Reported on 01/01/2019)  . [DISCONTINUED] predniSONE (DELTASONE) 20 MG tablet Take 2 tablets (40 mg total) by mouth daily. (Patient not taking: Reported on 01/01/2019)   No facility-administered encounter medications on file as of 01/01/2019.     Activities of Daily Living In your present state of health, do you have any difficulty performing the following activities: 01/01/2019  Hearing? Y  Comment hearing aids  Vision? N  Comment eyeglasses  Difficulty concentrating or making decisions? Y  Walking or climbing stairs? Y  Comment COPD  Dressing or bathing? N  Doing errands, shopping? N  Preparing Food and eating ? N  Using the Toilet? N  In the past six months, have you accidently leaked urine? N  Do you have problems with loss of bowel control? Y  Managing your Medications? N  Managing your Finances? N  Housekeeping or managing your Housekeeping? Y  Comment has someone who helps  Some recent data might be hidden    Patient Care Team: Olin Hauser, DO as PCP - General (Family Medicine) Erby Pian, MD as Referring Physician (Specialist) Lequita Asal, MD as Referring Physician (Hematology and Oncology)    Assessment:   This is a routine wellness examination for Candace Tapia.  Exercise Activities and Dietary recommendations Current Exercise Habits: The patient does not participate in regular exercise at present, Exercise limited by: orthopedic condition(s)  Goals    . Increase water intake     Recommend drinking at least 6-8 glasses of water a day       Fall Risk: Fall Risk  01/01/2019 07/06/2018 04/02/2018 01/23/2018 12/26/2017  Falls in the past year? 0 0 0 No No  Comment - - - - -  Follow up - Falls evaluation completed Falls evaluation completed - -    FALL RISK PREVENTION PERTAINING TO THE HOME:  Any stairs in or around the home? No  If so, are there any without handrails?  n/a  Home free of loose throw rugs in walkways, pet beds, electrical cords, etc? Yes  Adequate lighting in your home to reduce risk of falls? Yes   ASSISTIVE DEVICES UTILIZED TO PREVENT FALLS:  Life alert? Yes  Use of a cane, walker or w/c? Yes  Grab bars in the bathroom? Yes  Shower chair or bench in shower? Yes  Elevated toilet seat or a handicapped toilet? Yes   DME ORDERS:  DME order needed?  No   TIMED UP AND GO:  Was the test performed? Yes .  Length of time to ambulate 10 feet: 11 sec.   GAIT:  Appearance of gait:Gait slow and steady without the use of an assistive device.  Education: Fall risk prevention has been discussed.  Intervention(s) required? No   DME/home health order needed?  No    Depression Screen PHQ 2/9 Scores 01/01/2019 07/06/2018 04/02/2018 01/23/2018  PHQ - 2 Score 0 0 0 0  PHQ- 9 Score - 0 - 0  Exception Documentation - - - -     Cognitive Function     6CIT Screen 01/01/2019 12/26/2017 12/06/2016  What Year? 0 points 0 points 0 points  What month? 0 points 0 points 0 points  What time? 0 points 0 points 0 points  Count back from 20 0 points 0 points 0 points  Months in reverse 0 points 0 points 0 points  Repeat phrase 0 points 2 points 0 points  Total Score 0 2 0    Immunization History  Administered Date(s) Administered  . Fluad Quad(high Dose 65+) 01/01/2019  . Influenza, High Dose Seasonal PF 02/24/2015, 02/09/2016, 12/12/2016, 01/23/2018  . Influenza-Unspecified 01/16/2014  . Pneumococcal Conjugate-13 08/29/2014  . Pneumococcal Polysaccharide-23 04/19/2007  . Tdap 01/17/2013  . Zoster 08/29/2014    Qualifies for Shingles Vaccine? Yes  Zostavax completed n/a. Due for Shingrix. Education has been provided regarding the importance of this vaccine. Pt has been advised  to call insurance company to determine out of pocket expense. Advised may also receive vaccine at local pharmacy or Health Dept. Verbalized acceptance and understanding.   Tdap: up to date   Flu Vaccine: Due for Flu vaccine. Does the patient want to receive this vaccine today?  yes  Pneumococcal Vaccine: up to date   Screening Tests Health Maintenance  Topic Date Due  . TETANUS/TDAP  01/18/2023  . INFLUENZA VACCINE  Completed  . DEXA SCAN  Completed  . PNA vac Low Risk Adult  Completed    Cancer Screenings:  Colorectal Screening: no longer required  Mammogram: no longer required   Bone Density: Completed   Lung Cancer Screening: (Low Dose CT Chest recommended if Age 44-80 years, 30 pack-year currently smoking OR have quit w/in 15years.) does not qualify.     Additional Screening:  Hepatitis C Screening: does not qualify  Vision Screening: Recommended annual ophthalmology exams for early detection of glaucoma and other disorders of the eye. Is the patient up to date with their annual eye exam?  Yes  Who is the provider or what is the name of the office in which the pt attends annual eye exams? [facility]    Dental Screening: Recommended annual dental exams for proper oral hygiene  Community Resource Referral:  CRR required this visit?  No       Plan:  I have personally reviewed and addressed the Medicare Annual Wellness questionnaire and have noted the following in the patient's chart:  A. Medical and social history B. Use of alcohol, tobacco or illicit drugs  C. Current medications and supplements D. Functional ability and status E.  Nutritional status F.  Physical activity G. Advance directives H. List of other physicians I.  Hospitalizations, surgeries, and ER visits in previous 12 months J.  [place] such as hearing and vision if needed, cognitive and depression L. Referrals and appointments   In addition, I have reviewed and discussed with patient certain preventive protocols, quality metrics, and best practice recommendations. A written personalized care plan for preventive services as well as general  preventive health recommendations were provided to patient.  Signed,    Bevelyn Ngo, LPN  [id number] Nurse Health Advisor   Nurse Notes: none

## 2019-01-02 ENCOUNTER — Other Ambulatory Visit: Payer: Medicare Other

## 2019-01-02 ENCOUNTER — Telehealth: Payer: Self-pay

## 2019-01-02 DIAGNOSIS — E782 Mixed hyperlipidemia: Secondary | ICD-10-CM

## 2019-01-02 DIAGNOSIS — R7303 Prediabetes: Secondary | ICD-10-CM | POA: Diagnosis not present

## 2019-01-02 DIAGNOSIS — I10 Essential (primary) hypertension: Secondary | ICD-10-CM

## 2019-01-02 DIAGNOSIS — N183 Chronic kidney disease, stage 3 unspecified: Secondary | ICD-10-CM

## 2019-01-02 DIAGNOSIS — Z0000 Encounter for general adult medical examination without abnormal findings: Secondary | ICD-10-CM

## 2019-01-02 NOTE — Telephone Encounter (Signed)
Labs placed for lab appt today.

## 2019-01-03 LAB — COMPREHENSIVE METABOLIC PANEL
AG Ratio: 1.9 (calc) (ref 1.0–2.5)
ALT: 12 U/L (ref 6–29)
AST: 12 U/L (ref 10–35)
Albumin: 4.1 g/dL (ref 3.6–5.1)
Alkaline phosphatase (APISO): 60 U/L (ref 37–153)
BUN: 15 mg/dL (ref 7–25)
CO2: 28 mmol/L (ref 20–32)
Calcium: 9.5 mg/dL (ref 8.6–10.4)
Chloride: 101 mmol/L (ref 98–110)
Creat: 0.78 mg/dL (ref 0.60–0.88)
Globulin: 2.2 g/dL (calc) (ref 1.9–3.7)
Glucose, Bld: 107 mg/dL — ABNORMAL HIGH (ref 65–99)
Potassium: 3.7 mmol/L (ref 3.5–5.3)
Sodium: 140 mmol/L (ref 135–146)
Total Bilirubin: 0.4 mg/dL (ref 0.2–1.2)
Total Protein: 6.3 g/dL (ref 6.1–8.1)

## 2019-01-03 LAB — CBC WITH DIFFERENTIAL/PLATELET
Absolute Monocytes: 469 cells/uL (ref 200–950)
Basophils Absolute: 42 cells/uL (ref 0–200)
Basophils Relative: 0.6 %
Eosinophils Absolute: 133 cells/uL (ref 15–500)
Eosinophils Relative: 1.9 %
HCT: 37.8 % (ref 35.0–45.0)
Hemoglobin: 12.6 g/dL (ref 11.7–15.5)
Lymphs Abs: 1085 cells/uL (ref 850–3900)
MCH: 29.4 pg (ref 27.0–33.0)
MCHC: 33.3 g/dL (ref 32.0–36.0)
MCV: 88.3 fL (ref 80.0–100.0)
MPV: 11 fL (ref 7.5–12.5)
Monocytes Relative: 6.7 %
Neutro Abs: 5271 cells/uL (ref 1500–7800)
Neutrophils Relative %: 75.3 %
Platelets: 253 10*3/uL (ref 140–400)
RBC: 4.28 10*6/uL (ref 3.80–5.10)
RDW: 13.6 % (ref 11.0–15.0)
Total Lymphocyte: 15.5 %
WBC: 7 10*3/uL (ref 3.8–10.8)

## 2019-01-03 LAB — LIPID PANEL
Cholesterol: 186 mg/dL (ref <200)
HDL: 40 mg/dL — ABNORMAL LOW (ref >=50)
LDL Cholesterol (Calc): 118 mg/dL (calc) — ABNORMAL HIGH
Non-HDL Cholesterol (Calc): 146 mg/dL (calc) — ABNORMAL HIGH (ref <130)
Total CHOL/HDL Ratio: 4.7 (calc) (ref <5.0)
Triglycerides: 166 mg/dL — ABNORMAL HIGH (ref <150)

## 2019-01-03 LAB — HEMOGLOBIN A1C
Hgb A1c MFr Bld: 5.7 % of total Hgb — ABNORMAL HIGH (ref <5.7)
Mean Plasma Glucose: 117 (calc)
eAG (mmol/L): 6.5 (calc)

## 2019-01-03 LAB — TSH: TSH: 1.62 mIU/L (ref 0.40–4.50)

## 2019-01-07 DIAGNOSIS — G4734 Idiopathic sleep related nonobstructive alveolar hypoventilation: Secondary | ICD-10-CM | POA: Diagnosis not present

## 2019-01-07 DIAGNOSIS — R0600 Dyspnea, unspecified: Secondary | ICD-10-CM | POA: Diagnosis not present

## 2019-01-07 DIAGNOSIS — J310 Chronic rhinitis: Secondary | ICD-10-CM | POA: Diagnosis not present

## 2019-01-07 DIAGNOSIS — J449 Chronic obstructive pulmonary disease, unspecified: Secondary | ICD-10-CM | POA: Diagnosis not present

## 2019-01-09 ENCOUNTER — Encounter: Payer: Self-pay | Admitting: Family Medicine

## 2019-01-09 ENCOUNTER — Ambulatory Visit (INDEPENDENT_AMBULATORY_CARE_PROVIDER_SITE_OTHER): Payer: Medicare Other | Admitting: Family Medicine

## 2019-01-09 ENCOUNTER — Other Ambulatory Visit: Payer: Self-pay

## 2019-01-09 VITALS — BP 114/59 | HR 90 | Temp 98.7°F | Resp 16 | Ht 65.5 in | Wt 218.0 lb

## 2019-01-09 DIAGNOSIS — J432 Centrilobular emphysema: Secondary | ICD-10-CM | POA: Diagnosis not present

## 2019-01-09 DIAGNOSIS — H8192 Unspecified disorder of vestibular function, left ear: Secondary | ICD-10-CM

## 2019-01-09 DIAGNOSIS — E876 Hypokalemia: Secondary | ICD-10-CM

## 2019-01-09 DIAGNOSIS — F3342 Major depressive disorder, recurrent, in full remission: Secondary | ICD-10-CM

## 2019-01-09 DIAGNOSIS — I10 Essential (primary) hypertension: Secondary | ICD-10-CM

## 2019-01-09 DIAGNOSIS — E669 Obesity, unspecified: Secondary | ICD-10-CM | POA: Insufficient documentation

## 2019-01-09 DIAGNOSIS — Z0000 Encounter for general adult medical examination without abnormal findings: Secondary | ICD-10-CM

## 2019-01-09 DIAGNOSIS — N182 Chronic kidney disease, stage 2 (mild): Secondary | ICD-10-CM

## 2019-01-09 DIAGNOSIS — G629 Polyneuropathy, unspecified: Secondary | ICD-10-CM

## 2019-01-09 DIAGNOSIS — R7303 Prediabetes: Secondary | ICD-10-CM

## 2019-01-09 MED ORDER — MECLIZINE HCL 25 MG PO TABS: 12.5000 mg | ORAL_TABLET | Freq: Three times a day (TID) | ORAL | 3 refills | Status: DC | PRN

## 2019-01-09 MED ORDER — LOSARTAN POTASSIUM 100 MG PO TABS: 100.0000 mg | ORAL_TABLET | Freq: Every day | ORAL | 1 refills | Status: DC

## 2019-01-09 MED ORDER — AMLODIPINE BESYLATE 5 MG PO TABS: 5.0000 mg | ORAL_TABLET | Freq: Every day | ORAL | 1 refills | Status: DC

## 2019-01-09 MED ORDER — GABAPENTIN 400 MG PO CAPS: 400.0000 mg | ORAL_CAPSULE | Freq: Two times a day (BID) | ORAL | 1 refills | Status: DC

## 2019-01-09 MED ORDER — POTASSIUM CHLORIDE ER 20 MEQ PO TBCR: 20.0000 meq | EXTENDED_RELEASE_TABLET | ORAL | 3 refills | Status: DC

## 2019-01-09 NOTE — Assessment & Plan Note (Signed)
Controlled HTN - Home BP readings improved Complication with CKD-II to III    Plan:  1. Continue current BP regimen Losartan 100mg  daily, Chlorthalidone 25mg  daily, Amlodipine 5mg   2. Encourage improved lifestyle - low sodium diet, regular exercise 3. Continue monitor BP outside office, bring readings to next visit, if persistently >140/90 or new symptoms notify office sooner

## 2019-01-09 NOTE — Assessment & Plan Note (Signed)
Chronic problem, currently stable without flare Followed by [facility] On Symbicort and Albuterol

## 2019-01-09 NOTE — Assessment & Plan Note (Signed)
Stable to improved CKD-II with Cr / GFR Remains off NSAID

## 2019-01-09 NOTE — Assessment & Plan Note (Signed)
Remains improved mood, in remission Chronic history of MDD Follow-up as planned

## 2019-01-09 NOTE — Patient Instructions (Addendum)
Thank you for coming to the office today.  Switch potassium to every other day now, new rx  Ordered other meds, can pick up when ready  Continue with Dr Raul Del  Please schedule a Follow-up Appointment to: Return in about 6 months (around 07/09/2019) for 6 month follow-up PreDM A1c, COPD.  If you have any other questions or concerns, please feel free to call the office or send a message through [place]. You may also schedule an earlier appointment if necessary.  Additionally, you may be receiving a survey about your experience at our office within a few days to 1 week by e-mail or mail. We value your feedback.  Nobie Putnam, DO [place]

## 2019-01-09 NOTE — Assessment & Plan Note (Signed)
Obesity BMI >35 Encourage lifestyle diet and exercise, now seems to be more sedentary

## 2019-01-09 NOTE — Assessment & Plan Note (Signed)
Well controlled PreDM A1c 5.7 stable now Concern with obesity, HTN, HLD  Concern with neuropathy symptoms, seems less related to hyperglycemia.  Plan:  1. Not on any therapy currently  2. Encourage improved lifestyle - low carb, low sugar diet, reduce portion size, continue improving regular exercise

## 2019-01-09 NOTE — Assessment & Plan Note (Signed)
Stable, some chronic persistent dizziness episodic, suspected vertigo Chronic hearing loss L ear, has improved w/ hearing aid Graduated vestibular rehab, has exercises PRN Followed by [facility] Refill Meclizine PRN rarely using but would like to have

## 2019-01-09 NOTE — Assessment & Plan Note (Signed)
Improved K normal now On thiazide and K supplement Reduce to 73mEq QOD since not taking daily and doing well Re order

## 2019-01-09 NOTE — Progress Notes (Signed)
Subjective:    Patient ID: Candace Tapia, female    DOB: 31-Oct-1935, 83 y.o.   MRN: [id number]  Candace Tapia is a 83 y.o. female presenting on 01/09/2019 for Annual Exam   HPI   Here for Annual Physical and Lab Review.  Pre-Diabetes / obesity BMI >35 Reports no concerns. Last A1c trend 5.7 to 5.9 Due today for A1c Meds:Never on meds Improving low carb diet, mostly stable since prior Weight up +4 lbs Limited diet and exercise, admits difficulty exercising due to back hip pain arthritis, and often still eating sweets at time Denies hypoglycemia, polyuria, visual changes, numbness or tingling.  HYPERLIPIDEMIA: - Reports no concerns. Last lipid panel improved LDL Not on statin  CHRONIC HTN / Peripheral Edema / Hypokalemia Reports no concerns. BP readings normal Current Meds - Losartan 100mg  daily, Chlorthalidone 25mg  daily, Amlodipine 5mg  daily   Reports good compliance, took meds today. Tolerating well, w/o complaints. Taking Potassium infrequently now every other day currently, lab shows normal range 3.7 and improved diet in K foods  Chronic Vestibular disorder Followed by [facility] On meclizine PRN infrequent use, request new rx, has not returned to ENT lately  Recurrent Depression, - in remission Reports she has prior history of some depressed mood but overall has managed to cope with it for long time and currently doing well. Cannot tolerate medication, remains off  FOLLOW-UPPeripheral Neuropathy She is taking gabapentin 400mg  nightly doing well, sleepy stopped otc sleep aid, asking if can take twice a day, helps arthritis as well Denies any worsening pain, numbness tingling weakness, fall or injury  Centrilobular Emphysema (COPD) Followed by Dr Minerv[facility] On oxygen at night, nocturnal up to 2L Using inhalers ContinueSymbicort 160/4.5 two puffs bid, albuterolPRN She is doing well but often can get short of breath with exertion and using mask as  well at times.   Health Maintenance: UTD Flu vaccine, PNA vaccine  Depression screen [facility] 2/9 01/09/2019 01/01/2019 07/06/2018  Decreased Interest 0 0 0  Down, Depressed, Hopeless 0 0 0  PHQ - 2 Score 0 0 0  Altered sleeping 0 - 0  Tired, decreased energy 0 - 0  Change in appetite 0 - 0  Feeling bad or failure about yourself  0 - 0  Trouble concentrating 0 - 0  Moving slowly or fidgety/restless 0 - 0  Suicidal thoughts 0 - 0  PHQ-9 Score 0 - 0  Difficult doing work/chores Not difficult at all - Not difficult at all  Some recent data might be hidden    Past Medical History:  Diagnosis Date  . Allergy   . Arthritis   . Breast cancer ([place]) 2014   radiation and taking exemestane  . Cancer of left breast ([place]) 10/27/2014  . GERD (gastroesophageal reflux disease)   . H/O: hysterectomy   . History of lumpectomy 2014   LT breast   Past Surgical History:  Procedure Laterality Date  . ABDOMINAL HYSTERECTOMY    . BREAST LUMPECTOMY Left 2014  . BREAST SURGERY Left 2014  . JOINT REPLACEMENT    . LYMPH NODE DISSECTION     neck  . TOTAL SHOULDER REPLACEMENT Bilateral 2010 and 2013   Social History   Socioeconomic History  . Marital status: Widowed    Spouse name: Not on file  . Number of children: Not on file  . Years of education: Not on file  . Highest education level: 8th grade  Occupational History  . Not on  file  Social Needs  . Financial resource strain: Not hard at all  . Food insecurity    Worry: Never true    Inability: Never true  . Transportation needs    Medical: No    Non-medical: No  Tobacco Use  . Smoking status: Former Smoker    Packs/day: 2.00    Years: 40.00    Pack years: 80.00    Quit date: 04/19/1993    Years since quitting: 25.7  . Smokeless tobacco: Never Used  Substance and Sexual Activity  . Alcohol use: No    Alcohol/week: 0.0 standard drinks  . Drug use: No  . Sexual activity: Never  Lifestyle  . Physical activity    Days per week: 0  days    Minutes per session: 0 min  . Stress: Not at all  Relationships  . Social Herbalist on phone: Once a week    Gets together: More than three times a week    Attends religious service: Never    Active member of club or organization: No    Attends meetings of clubs or organizations: Never    Relationship status: Widowed  . Intimate partner violence    Fear of current or ex partner: No    Emotionally abused: No    Physically abused: No    Forced sexual activity: No  Other Topics Concern  . Not on file  Social History Narrative  . Not on file   Family History  Problem Relation Age of Onset  . Breast cancer Mother 12  . Diabetes Mother   . Kidney disease Father   . Diabetes Father   . Breast cancer Sister 43   Current Outpatient Medications on File Prior to Visit  Medication Sig  . acetaminophen (TYLENOL) 650 MG CR tablet Take 650 mg by mouth every 8 (eight) hours as needed for pain.  Marland Kitchen albuterol (PROAIR HFA) 108 (90 BASE) MCG/ACT inhaler Inhale 1 puff into the lungs every 6 (six) hours as needed.   Marland Kitchen aspirin 81 MG chewable tablet Chew 162 mg by mouth 2 (two) times daily.  . budesonide-formoterol (SYMBICORT) 160-4.5 MCG/ACT inhaler Inhale 2 puffs into the lungs 2 (two) times daily.  . chlorthalidone (HYGROTON) 25 MG tablet TAKE 1 TABLET BY MOUTH EVERY DAY  . Cholecalciferol (KP VITAMIN D3) 25 MCG (1000 UT) capsule Vitamin D3 25 mcg (1,000 unit) capsule  1 po qd  . fexofenadine (ALLEGRA ALLERGY) 180 MG tablet Take 180 mg by mouth as needed. Reported on 09/29/2015  . fluticasone (FLONASE) 50 MCG/ACT nasal spray Place 1 spray into both nostrils as needed.   . fluticasone-salmeterol (ADVAIR HFA) 115-21 MCG/ACT inhaler Inhale 2 puffs into the lungs 2 (two) times daily.  . Multiple Vitamins-Minerals (MULTIVITAMIN WOMEN 50+) TABS Take 2 tablets by mouth daily.  Marland Kitchen NEXIUM 20 MG capsule Take 1 capsule (20 mg total) by mouth daily.   No current facility-administered  medications on file prior to visit.     Review of Systems  Constitutional: Negative for activity change, appetite change, chills, diaphoresis, fatigue and fever.  HENT: Negative for congestion and hearing loss.   Eyes: Negative for visual disturbance.  Respiratory: Positive for shortness of breath (at baseline). Negative for apnea, cough, chest tightness and wheezing.   Cardiovascular: Negative for chest pain, palpitations and leg swelling.  Gastrointestinal: Negative for abdominal pain, anal bleeding, blood in stool, constipation, diarrhea, nausea and vomiting.  Endocrine: Negative for cold intolerance.  Genitourinary: Negative  for difficulty urinating, dysuria, frequency and hematuria.  Musculoskeletal: Positive for arthralgias. Negative for back pain and neck pain.  Skin: Negative for rash.  Allergic/Immunologic: Negative for environmental allergies.  Neurological: Negative for dizziness, weakness, light-headedness, numbness and headaches.  Hematological: Negative for adenopathy.  Psychiatric/Behavioral: Negative for behavioral problems, dysphoric mood and sleep disturbance. The patient is not nervous/anxious.    Per HPI unless specifically indicated above     Objective:    BP (!) 114/59   Pulse 90   Temp 98.7 F (37.1 C) (Oral)   Resp 16   Ht 5' 5.5" (1.664 m)   Wt 218 lb (98.9 kg)   BMI 35.73 kg/m   Wt Readings from Last 3 Encounters:  01/09/19 218 lb (98.9 kg)  01/01/19 216 lb 9.6 oz (98.2 kg)  10/03/18 214 lb (97.1 kg)    Physical Exam Vitals signs and nursing note reviewed.  Constitutional:      General: She is not in acute distress.    Appearance: She is well-developed. She is not diaphoretic.     Comments: Well-appearing, comfortable, cooperative  HENT:     Head: Normocephalic and atraumatic.  Eyes:     General:        Right eye: No discharge.        Left eye: No discharge.     Conjunctiva/sclera: Conjunctivae normal.     Pupils: Pupils are equal, round,  and reactive to light.  Neck:     Musculoskeletal: Normal range of motion and neck supple.     Thyroid: No thyromegaly.  Cardiovascular:     Rate and Rhythm: Normal rate and regular rhythm.     Heart sounds: Normal heart sounds. No murmur.  Pulmonary:     Effort: Pulmonary effort is normal. No respiratory distress.     Breath sounds: Normal breath sounds. No wheezing or rales.     Comments: Good air movement, occasional short of breath while wearing mask Abdominal:     General: Bowel sounds are normal. There is no distension.     Palpations: Abdomen is soft. There is no mass.     Tenderness: There is no abdominal tenderness.  Musculoskeletal: Normal range of motion.        General: No tenderness.     Comments: Upper / Lower Extremities: - Normal muscle tone, strength bilateral upper extremities 5/5, lower extremities 5/5  Using cane for ambulation  Lymphadenopathy:     Cervical: No cervical adenopathy.  Skin:    General: Skin is warm and dry.     Findings: No erythema or rash.  Neurological:     Mental Status: She is alert and oriented to person, place, and time.     Comments: Distal sensation intact to light touch all extremities  Psychiatric:        Behavior: Behavior normal.     Comments: Well groomed, good eye contact, normal speech and thoughts      Recent Labs    07/06/18 1336 01/02/19 0820  HGBA1C 5.6 5.7*     Results for orders placed or performed in visit on 01/02/19  Lipid panel  Result Value Ref Range   Cholesterol 186 <200 mg/dL   HDL 40 (L) > OR = 50 mg/dL   Triglycerides 166 (H) <150 mg/dL   LDL Cholesterol (Calc) 118 (H) mg/dL (calc)   Total CHOL/HDL Ratio 4.7 <5.0 (calc)   Non-HDL Cholesterol (Calc) 146 (H) <130 mg/dL (calc)  Comprehensive metabolic panel  Result Value Ref Range  Glucose, Bld 107 (H) 65 - 99 mg/dL   BUN 15 7 - 25 mg/dL   Creat 0.78 0.60 - 0.88 mg/dL   BUN/Creatinine Ratio NOT APPLICABLE 6 - 22 (calc)   Sodium 140 135 - 146  mmol/L   Potassium 3.7 3.5 - 5.3 mmol/L   Chloride 101 98 - 110 mmol/L   CO2 28 20 - 32 mmol/L   Calcium 9.5 8.6 - 10.4 mg/dL   Total Protein 6.3 6.1 - 8.1 g/dL   Albumin 4.1 3.6 - 5.1 g/dL   Globulin 2.2 1.9 - 3.7 g/dL (calc)   AG Ratio 1.9 1.0 - 2.5 (calc)   Total Bilirubin 0.4 0.2 - 1.2 mg/dL   Alkaline phosphatase (APISO) 60 37 - 153 U/L   AST 12 10 - 35 U/L   ALT 12 6 - 29 U/L  Hemoglobin A1c  Result Value Ref Range   Hgb A1c MFr Bld 5.7 (H) <5.7 % of total Hgb   Mean Plasma Glucose 117 (calc)   eAG (mmol/L) 6.5 (calc)  TSH  Result Value Ref Range   TSH 1.62 0.40 - 4.50 mIU/L  CBC with Differential/Platelet  Result Value Ref Range   WBC 7.0 3.8 - 10.8 Thousand/uL   RBC 4.28 3.80 - 5.10 Million/uL   Hemoglobin 12.6 11.7 - 15.5 g/dL   HCT 37.8 35.0 - 45.0 %   MCV 88.3 80.0 - 100.0 fL   MCH 29.4 27.0 - 33.0 pg   MCHC 33.3 32.0 - 36.0 g/dL   RDW 13.6 11.0 - 15.0 %   Platelets 253 140 - 400 Thousand/uL   MPV 11.0 7.5 - 12.5 fL   Neutro Abs 5,271 1,500 - 7,800 cells/uL   Lymphs Abs 1,085 850 - 3,900 cells/uL   Absolute Monocytes 469 200 - 950 cells/uL   Eosinophils Absolute 133 15 - 500 cells/uL   Basophils Absolute 42 0 - 200 cells/uL   Neutrophils Relative % 75.3 %   Total Lymphocyte 15.5 %   Monocytes Relative 6.7 %   Eosinophils Relative 1.9 %   Basophils Relative 0.6 %      Assessment & Plan:   Problem List Items Addressed This Visit    Centrilobular emphysema (HCC)    Chronic problem, currently stable without flare Followed by [facility] On Symbicort and Albuterol      CKD (chronic kidney disease), stage II    Stable to improved CKD-II with Cr / GFR Remains off NSAID      Essential hypertension    Controlled HTN - Home BP readings improved Complication with CKD-II to III    Plan:  1. Continue current BP regimen Losartan 100mg  daily, Chlorthalidone 25mg  daily, Amlodipine 5mg   2. Encourage improved lifestyle - low sodium diet, regular exercise  3. Continue monitor BP outside office, bring readings to next visit, if persistently >140/90 or new symptoms notify office sooner      Relevant Medications   losartan (COZAAR) 100 MG tablet   amLODipine (NORVASC) 5 MG tablet   Hypokalemia    Improved K normal now On thiazide and K supplement Reduce to 53mEq QOD since not taking daily and doing well Re order      Relevant Medications   Potassium Chloride ER 20 MEQ TBCR   Major depression in full remission ([place])    Remains improved mood, in remission Chronic history of MDD Follow-up as planned      Obesity (BMI 35.0-39.9 without comorbidity)    Obesity BMI >35 Encourage lifestyle  diet and exercise, now seems to be more sedentary      Peripheral polyneuropathy   Relevant Medications   gabapentin (NEURONTIN) 400 MG capsule   Pre-diabetes    Well controlled PreDM A1c 5.7 stable now Concern with obesity, HTN, HLD  Concern with neuropathy symptoms, seems less related to hyperglycemia.  Plan:  1. Not on any therapy currently  2. Encourage improved lifestyle - low carb, low sugar diet, reduce portion size, continue improving regular exercise      Vestibular disorder    Stable, some chronic persistent dizziness episodic, suspected vertigo Chronic hearing loss L ear, has improved w/ hearing aid Graduated vestibular rehab, has exercises PRN Followed by [facility] Refill Meclizine PRN rarely using but would like to have      Relevant Medications   meclizine (ANTIVERT) 25 MG tablet    Other Visit Diagnoses    Annual physical exam    -  Primary      Updated Health Maintenance information - UTD on flu vaccine, PNA vaccine Reviewed recent lab results with patient Encouraged improvement to lifestyle with diet and exercise - Goal of weight loss   Meds ordered this encounter  Medications  . Potassium Chloride ER 20 MEQ TBCR    Sig: Take 20 mEq by mouth every other day.    Dispense:  45 tablet    Refill:  3  .  meclizine (ANTIVERT) 25 MG tablet    Sig: Take 0.5 tablets (12.5 mg total) by mouth every 8 (eight) hours as needed for dizziness.    Dispense:  40 tablet    Refill:  3    Keep on file until patient request  . losartan (COZAAR) 100 MG tablet    Sig: Take 1 tablet (100 mg total) by mouth daily.    Dispense:  90 tablet    Refill:  1    Keep on file until patient request  . gabapentin (NEURONTIN) 400 MG capsule    Sig: Take 1 capsule (400 mg total) by mouth 2 (two) times daily.    Dispense:  180 capsule    Refill:  1    Keep on file until patient request  . amLODipine (NORVASC) 5 MG tablet    Sig: Take 1 tablet (5 mg total) by mouth daily.    Dispense:  90 tablet    Refill:  1    Keep on file until patient request     Follow up plan: Return in about 6 months (around 07/09/2019) for 6 month follow-up PreDM A1c, COPD.  Nobie [facility]nam, [facility] 01/09/2019, 2:06 PM

## 2019-01-21 DIAGNOSIS — J449 Chronic obstructive pulmonary disease, unspecified: Secondary | ICD-10-CM | POA: Diagnosis not present

## 2019-02-07 ENCOUNTER — Ambulatory Visit (INDEPENDENT_AMBULATORY_CARE_PROVIDER_SITE_OTHER): Payer: Medicare Other | Admitting: Licensed Clinical Social Worker

## 2019-02-07 DIAGNOSIS — F3342 Major depressive disorder, recurrent, in full remission: Secondary | ICD-10-CM

## 2019-02-07 NOTE — Chronic Care Management (AMB) (Signed)
Chronic Care Management    Clinical Social Work General Note  02/07/2019 Name: DAQUISHA CLERMONT MRN: [id number] DOB: June 02, 1935  [name] is a 83 y.o. year old female who is a primary care patient of Olin Hauser, DO. The CCM was consulted to assist the patient with Level of Care Concerns.   Ms. Garramone was given information about Chronic Care Management services today including:  1. CCM service includes personalized support from designated clinical staff supervised by her physician, including individualized plan of care and coordination with other care providers 2. 24/7 contact phone numbers for assistance for urgent and routine care needs. 3. Service will only be billed when office clinical staff spend 20 minutes or more in a month to coordinate care. 4. Only one practitioner may furnish and bill the service in a calendar month. 5. The patient may stop CCM services at any time (effective at the end of the month) by phone call to the office staff. 6. The patient will be responsible for cost sharing (co-pay) of up to 20% of the service fee (after annual deductible is met).  Patient agreed to services and verbal consent obtained.   Review of patient status, including review of consultants reports, relevant laboratory and other test results, and collaboration with appropriate care team members and the patient's provider was performed as part of comprehensive patient evaluation and provision of chronic care management services.    SDOH (Social Determinants of Health) screening performed today. See Care Plan Entry related to challenges with: Financial Strain   Advanced Directives Status: <no information> See Care Plan for related entries.   Outpatient Encounter Medications as of 02/07/2019  Medication Sig Note  . acetaminophen (TYLENOL) 650 MG CR tablet Take 650 mg by mouth every 8 (eight) hours as needed for pain.   Marland Kitchen albuterol (PROAIR HFA) 108 (90 BASE) MCG/ACT inhaler  Inhale 1 puff into the lungs every 6 (six) hours as needed.    Marland Kitchen amLODipine (NORVASC) 5 MG tablet Take 1 tablet (5 mg total) by mouth daily.   Marland Kitchen aspirin 81 MG chewable tablet Chew 162 mg by mouth 2 (two) times daily. 01/01/2019: Takes every once in a while  . budesonide-formoterol (SYMBICORT) 160-4.5 MCG/ACT inhaler Inhale 2 puffs into the lungs 2 (two) times daily.   . chlorthalidone (HYGROTON) 25 MG tablet TAKE 1 TABLET BY MOUTH EVERY DAY   . Cholecalciferol (KP VITAMIN D3) 25 MCG (1000 UT) capsule Vitamin D3 25 mcg (1,000 unit) capsule  1 po qd   . fexofenadine (ALLEGRA ALLERGY) 180 MG tablet Take 180 mg by mouth as needed. Reported on 09/29/2015   . fluticasone (FLONASE) 50 MCG/ACT nasal spray Place 1 spray into both nostrils as needed.    . fluticasone-salmeterol (ADVAIR HFA) 115-21 MCG/ACT inhaler Inhale 2 puffs into the lungs 2 (two) times daily.   Marland Kitchen gabapentin (NEURONTIN) 400 MG capsule Take 1 capsule (400 mg total) by mouth 2 (two) times daily.   Marland Kitchen losartan (COZAAR) 100 MG tablet Take 1 tablet (100 mg total) by mouth daily.   . meclizine (ANTIVERT) 25 MG tablet Take 0.5 tablets (12.5 mg total) by mouth every 8 (eight) hours as needed for dizziness.   . Multiple Vitamins-Minerals (MULTIVITAMIN WOMEN 50+) TABS Take 2 tablets by mouth daily.   Marland Kitchen NEXIUM 20 MG capsule Take 1 capsule (20 mg total) by mouth daily.   . Potassium Chloride ER 20 MEQ TBCR Take 20 mEq by mouth every other day.    No  facility-administered encounter medications on file as of 02/07/2019.     Goals Addressed    . "I really need LTC placement or in home support." (pt-stated)       Current Barriers:  . Financial constraints related to affording in home support or long term care placement . Limited social support . Level of care concerns . ADL IADL limitations . Limited access to caregiver . Inability to perform ADL's independently . Inability to perform IADL's independently  Clinical Social Work Clinical Goal(s):   Marland Kitchen Over the next 120 days, patient will work with SW to address concerns related to gaining additional support in the home until patient is able to pursue LTC placement  Interventions: . Patient interviewed and appropriate assessments performed . Provided patient with information about long term care placement (ALF) and payment sources-out of pocket or Medicaid. LCSW also provided education on personal care service resources within the area. Patient agreeable to C.H.O.R.E referral. . Discussed plans with patient for ongoing care management follow up and provided patient with direct contact information for care management team . Advised patient to consider finding in home support. Patient found an aide that cost $20 per hour but is not able to afford this service. LCSW encouraged patient to network to see if she can find someone at a lesser rate. Nash Dimmer with RN Case Manager re: need for care management services . LCSW will make referral for C.H.O.R.E program on next outreach appointment. . Assisted patient/caregiver with obtaining information about health plan benefits . Provided education and assistance to client regarding Advanced Directives. . Provided education to patient/caregiver regarding level of care options.  Patient Self Care Activities:  . Self administers medications as prescribed . Attends all scheduled provider appointments . Unable to independently take care of self within the home and is in need of additional personal care service involvement.   Initial goal documentation     Follow Up Plan: SW will follow up with patient by phone over the next quarter      [name], [place], MSW, [email or web address] Phone: [phone]

## 2019-02-19 ENCOUNTER — Other Ambulatory Visit: Payer: Self-pay | Admitting: Family Medicine

## 2019-02-19 DIAGNOSIS — G629 Polyneuropathy, unspecified: Secondary | ICD-10-CM

## 2019-02-21 DIAGNOSIS — J449 Chronic obstructive pulmonary disease, unspecified: Secondary | ICD-10-CM | POA: Diagnosis not present

## 2019-02-25 ENCOUNTER — Ambulatory Visit: Payer: Medicare Other | Admitting: *Deleted

## 2019-02-25 DIAGNOSIS — F3342 Major depressive disorder, recurrent, in full remission: Secondary | ICD-10-CM

## 2019-02-25 DIAGNOSIS — J449 Chronic obstructive pulmonary disease, unspecified: Secondary | ICD-10-CM

## 2019-02-25 DIAGNOSIS — G894 Chronic pain syndrome: Secondary | ICD-10-CM

## 2019-02-25 NOTE — Patient Instructions (Signed)
Thank you allowing the Chronic Care Management Team to be a part of your care! It was a pleasure speaking with you today!   CCM (Chronic Care Management) Team   Narciso Stoutenburg RN, BSN Nurse Care Coordinator  [phone]  Harlow Asa PharmD  Clinical Pharmacist  [phone]  [name] LCSW Clinical Social Worker [phone]  Goals Addressed            This Visit's Progress   . I would like to improve my activity tolerance (pt-stated)       Current Barriers:  Marland Kitchen Knowledge deficits related to basic COPD self care/management . Knowledge deficit related to basic understanding of how to use inhalers and how inhaled medications work . Transportation barriers . Limited Social Support-Lives alone   Case Manager Clinical Goal(s):  Over the next 90 days, patient will verbalize basic understanding of COPD disease process and self care activities   Interventions:   Provided patient with basic written and verbal COPD education on self care/management/and exacerbation prevention   Initial phone call to patient, discussion about medications patient revealed she is only using inhalers occasionally.   Patient reports decreased activity tolerance related to being out of breath, lots of arthritis pain, and dizziness, which leads to depression. (reports she has dealt with depression for awhile)   Discussed with patient her openness to a pharmacy consult to discuss and review medications.   Discussed plans with patient for ongoing care management follow up and provided patient with direct contact information for care management team  Transportation provided by daughter in law only on days she does not work  Patient reports not able to go out or socialize with friends but does talk to them on the phone some.   Patient Self Care Activities:  . Performs ADL's independently . Unable to perform IADLs independently  Initial goal documentation        The patient verbalized  understanding of instructions provided today and declined a print copy of patient instruction materials.   The patient has been provided with contact information for the care management team and has been advised to call with any health related questions or concerns.

## 2019-02-25 NOTE — Chronic Care Management (AMB) (Signed)
Chronic Care Management   Follow Up Note   02/25/2019 Name: Candace Tapia MRN: [id number] DOB: [birth year]  Referred by: Olin Hauser, DO Reason for referral : Chronic Care Management (COPD, HTN, Chronic Pain)   Candace Tapia is a 83 y.o. year old female who is a primary care patient of Olin Hauser, DO. The CCM team was consulted for assistance with chronic disease management and care coordination needs.    Review of patient status, including review of consultants reports, relevant laboratory and other test results, and collaboration with appropriate care team members and the patient's provider was performed as part of comprehensive patient evaluation and provision of chronic care management services.    SDOH (Social Determinants of Health) screening performed today: Depression   Social Connections Physical Activity. See Care Plan for related entries.   Outpatient Encounter Medications as of 02/25/2019  Medication Sig Note  . acetaminophen (TYLENOL) 650 MG CR tablet Take 650 mg by mouth every 8 (eight) hours as needed for pain.   Marland Kitchen albuterol (PROAIR HFA) 108 (90 BASE) MCG/ACT inhaler Inhale 1 puff into the lungs every 6 (six) hours as needed.    Marland Kitchen amLODipine (NORVASC) 5 MG tablet Take 1 tablet (5 mg total) by mouth daily.   Marland Kitchen aspirin 81 MG chewable tablet Chew 162 mg by mouth 2 (two) times daily. 01/01/2019: Takes every once in a while  . budesonide-formoterol (SYMBICORT) 160-4.5 MCG/ACT inhaler Inhale 2 puffs into the lungs 2 (two) times daily.   . chlorthalidone (HYGROTON) 25 MG tablet TAKE 1 TABLET BY MOUTH EVERY DAY   . Cholecalciferol (KP VITAMIN D3) 25 MCG (1000 UT) capsule Vitamin D3 25 mcg (1,000 unit) capsule  1 po qd   . fexofenadine (ALLEGRA ALLERGY) 180 MG tablet Take 180 mg by mouth as needed. Reported on 09/29/2015   . fluticasone (FLONASE) 50 MCG/ACT nasal spray Place 1 spray into both nostrils as needed.    . fluticasone-salmeterol (ADVAIR HFA)  115-21 MCG/ACT inhaler Inhale 2 puffs into the lungs 2 (two) times daily.   Marland Kitchen gabapentin (NEURONTIN) 400 MG capsule TAKE 1 CAPSULE(400 MG) BY MOUTH TWICE DAILY   . losartan (COZAAR) 100 MG tablet Take 1 tablet (100 mg total) by mouth daily.   . meclizine (ANTIVERT) 25 MG tablet Take 0.5 tablets (12.5 mg total) by mouth every 8 (eight) hours as needed for dizziness.   . Multiple Vitamins-Minerals (MULTIVITAMIN WOMEN 50+) TABS Take 2 tablets by mouth daily.   Marland Kitchen NEXIUM 20 MG capsule Take 1 capsule (20 mg total) by mouth daily.   . Potassium Chloride ER 20 MEQ TBCR Take 20 mEq by mouth every other day.    No facility-administered encounter medications on file as of 02/25/2019.      Goals Addressed            This Visit's Progress   . I would like to improve my activity tolerance (pt-stated)       Current Barriers:  Marland Kitchen Knowledge deficits related to basic COPD self care/management . Knowledge deficit related to basic understanding of how to use inhalers and how inhaled medications work . Transportation barriers . Limited Social Support-Lives alone   Case Manager Clinical Goal(s):  Over the next 90 days, patient will verbalize basic understanding of COPD disease process and self care activities   Interventions:   Provided patient with basic written and verbal COPD education on self care/management/and exacerbation prevention   Initial phone call to patient, discussion about medications  patient revealed she is only using inhalers occasionally.   Patient reports decreased activity tolerance related to being out of breath, lots of arthritis pain, and dizziness, which leads to depression. (reports she has dealt with depression for awhile)   Discussed with patient her openness to a pharmacy consult to discuss and review medications.   Discussed plans with patient for ongoing care management follow up and provided patient with direct contact information for care management team   Transportation provided by daughter in law only on days she does not work  Patient reports not able to go out or socialize with friends but does talk to them on the phone some.   Patient Self Care Activities:  . Performs ADL's independently . Unable to perform IADLs independently  Initial goal documentation         The care management team will reach out to the patient again over the next 30 days.  The patient has been provided with contact information for the care management team and has been advised to call with any health related questions or concerns.    Merlene Morse Candace Henton RN, BSN Nurse Case Pharmacist, [facility]  [phone]) [employer]

## 2019-03-04 ENCOUNTER — Ambulatory Visit: Payer: Self-pay | Admitting: Pharmacist

## 2019-03-04 ENCOUNTER — Other Ambulatory Visit: Payer: Self-pay | Admitting: Family Medicine

## 2019-03-04 DIAGNOSIS — I10 Essential (primary) hypertension: Secondary | ICD-10-CM

## 2019-03-04 DIAGNOSIS — E876 Hypokalemia: Secondary | ICD-10-CM

## 2019-03-04 DIAGNOSIS — J449 Chronic obstructive pulmonary disease, unspecified: Secondary | ICD-10-CM

## 2019-03-04 MED ORDER — POTASSIUM CHLORIDE ER 10 MEQ PO CPCR: 10.0000 meq | ORAL_CAPSULE | Freq: Every day | ORAL | 3 refills | Status: DC

## 2019-03-04 NOTE — Chronic Care Management (AMB) (Signed)
Chronic Care Management   Note  03/04/2019 Name: Candace Tapia MRN: Tapia DOB: [birth year]   Subjective:   Candace Tapia is a 83 y.o. year old female who is a primary care patient of Olin Hauser, DO. The CM team was consulted for assistance with chronic disease management and care coordination. Candace Tapia has a past medical history including but not limited to osteoarthritis, chronic vestibular disorder, COPD, prediabetes, hypertension, peripheral edema, hypokalemia, depression, CKD, allergic rhinitis, GERD and peripheral neuropathy.  Receive referral from CCM Nurse Case manager for medication review  I reached out to [name] by phone today.   Review of patient status, including review of consultants reports, laboratory and other test data, was performed as part of comprehensive evaluation and provision of chronic care management services.   Objective:  Lab Results  Component Value Date   CREATININE 0.78 01/02/2019   CREATININE 0.90 10/02/2018   CREATININE 0.92 01/15/2018    Lab Results  Component Value Date   HGBA1C 5.7 (H) 01/02/2019       Component Value Date/Time   CHOL 186 01/02/2019 0820   CHOL 233 (H) 06/12/2015 0812   TRIG 166 (H) 01/02/2019 0820   HDL 40 (L) 01/02/2019 0820   HDL 47 06/12/2015 0812   CHOLHDL 4.7 01/02/2019 0820   VLDL 29 12/05/2016 1207   LDLCALC 118 (H) 01/02/2019 0820    BP Readings from Last 3 Encounters:  01/09/19 (!) 114/59  01/01/19 126/72  10/03/18 120/62    Allergies  Allergen Reactions  . Tramadol Hcl Itching    Per pt it caused me to itch all over  . Statins     Not sure what exact med was; just made her feel bad (can't elaborate)  . Penicillin G Rash  . Penicillins Rash    Has patient had a PCN reaction causing immediate rash, facial/tongue/throat swelling, SOB or lightheadedness with hypotension: No Has patient had a PCN reaction causing severe rash involving mucus membranes or skin  necrosis: No Has patient had a PCN reaction that required hospitalization: No Has patient had a PCN reaction occurring within the last 10 years: No If all of the above answers are "NO", then may proceed with Cephalosporin use.  . Sulfa Antibiotics Other (See Comments) and Rash    Medications Reviewed Today    Reviewed by Vella Raring, [facility] (Pharmacist) on 03/04/19 at 1343  Med List Status: <None>  Medication Order Taking? Sig Documenting Provider Last Dose Status Informant  acetaminophen (TYLENOL) 650 MG CR tablet [id number] Yes Take 650 mg by mouth every 8 (eight) hours as needed for pain. [provider] Taking Active Self  albuterol (PROAIR HFA) 108 (90 BASE) MCG/ACT inhaler [id number] Yes Inhale 1 puff into the lungs every 6 (six) hours as needed.  [provider] Taking Active Self           Med Note Fabio Bering   Sat May 13, 2017 10:07 AM)    amLODipine (NORVASC) 5 MG tablet [id number] Yes Take 1 tablet (5 mg total) by mouth daily. Olin Hauser, DO Taking Active            Med Note [place]infield Cunas, Dakota Vanwart A   Mon Mar 04, 2019  1:27 PM) At bedtime  aspirin 81 MG chewable tablet [id number] No Chew 162 mg by mouth 2 (two) times daily. [provider] Not Taking Active            Med Note (HILL,  TIFFANY A   Tue Jan 01, 2019  3:30 PM) Takes every once in a while  budesonide-formoterol [facility]) 160-4.5 MCG/ACT inhaler [id number] No Inhale 2 puffs into the lungs 2 (two) times daily. [provider] Not Taking Active   chlorthalidone (HYGROTON) 25 MG tablet [id number] Yes TAKE 1 TABLET BY MOUTH EVERY DAY Karamalegos, Devonne Doughty, DO Taking Active   Cholecalciferol (KP VITAMIN D3) 25 MCG (1000 UT) capsule [id number] No Vitamin D3 25 mcg (1,000 unit) capsule  1 po qd [provider] Not Taking Active   fexofenadine (ALLEGRA ALLERGY) 180 MG tablet [id number]  Take 180 mg by mouth as needed. Reported on 09/29/2015 [provider]   Active Self           Med Note Fabio Bering   Sat May 13, 2017 10:03 AM)    fluticasone (FLONASE) 50 MCG/ACT nasal spray [id number] Yes Place 1 spray into both nostrils as needed.  [provider] Taking Active Self       Patient not taking:      Discontinued 03/04/19 1342 (Change in therapy)   gabapentin (NEURONTIN) 400 MG capsule [id number] Yes TAKE 1 CAPSULE(400 MG) BY MOUTH TWICE DAILY Karamalegos, Devonne Doughty, DO Taking Active   losartan (COZAAR) 100 MG tablet [id number] Yes Take 1 tablet (100 mg total) by mouth daily. Olin Hauser, DO Taking Active   meclizine (ANTIVERT) 25 MG tablet [id number] Yes Take 0.5 tablets (12.5 mg total) by mouth every 8 (eight) hours as needed for dizziness. Olin Hauser, DO Taking Active   Multiple Vitamins-Minerals (MULTIVITAMIN WOMEN 50+) TABS [id number] Yes Take 1 tablet by mouth daily.  [provider] Taking Active Self  NEXIUM 20 MG capsule [id number] Yes Take 1 capsule (20 mg total) by mouth daily. Arlis Porta., MD Taking Active Self  Potassium Chloride ER 20 MEQ TBCR [id number] Yes Take 20 mEq by mouth every other day. Olin Hauser, DO Taking Active   Med List Note Ignatius Specking, RN 02/08/16 1505): UDS 02/08/16 meds due 03/09/16           Assessment:   Goals Addressed            This Visit's Progress   . PharmD - Medication review       Current Barriers:  Marland Kitchen Knowledge deficits related to basic COPD self care/management . Knowledge deficit related to basic understanding of how to use inhalers and how inhaled medications work . Limited Social Support  Pharmacist Clinical Goal(s):  Marland Kitchen Over the next 30 days, patient will work with CM Pharmacist to address needs related to medication adherence and medication regimen optimization  Interventions: . Comprehensive medication review performed; medication list updated in electronic medical record o Reports osteoarthritis pain  relief from scheduled acetaminophen. Denies currently taking aspirin. - Per provider chart notes, previously taking aspirin for pain relief. Per PCP note from 03/2017, advised to used acetaminophen over aspirin for pain relief. Patient instructed that she could take aspirin 81 mg daily or less as needed. o Identify patient currently not taking Symbicort inhaler. Confusion about inhalers. Has been taking Proair, thinking it was her long-acting inhaler. Note both inhalers are red. - Counsel patient on maintenance (Symbicort) vs rescue inhaler (Proair) including how to use each inhaler and how to recognize the difference. Counsel patient on rinsing mouth out after each use of Symbicort. - Advise patient to pick up refill of Symbicort inhaler from pharmacy and resume taking as directed by Pulmonologist.  o Counsel regarding caution for risk of sedation with gabapentin o Candace Tapia admits to difficulty with swallowing potassium tablet whole. Reports currently takes potassium tablet by allowing to sit in yogurt for ~1 hour and then chewing tablet. Note extended release tablet should not be chewed - Counsel on alternative dosage forms exist, such as a sprinkle capsule. Note potassium chloride (MicroK) 10 mEq capsule can be opened and contents sprinkled on soft food (applesauce, yogurt, etc) and immediately swallowed without chewing. Per patient's health plan formulary capsule formulation is also a tier 2 option. . Denies currently check blood pressure at home. 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  o Advise patient to check if BP monitor is working, if not, will aid patient with obtaining new monitor. . Coordination of care call to [place]. Symbicort inhaler last refiled 05/2018. Prescription active. Request pharmacy refill inhaler now.  . Will collaborate with PCP regarding switch of patient's potassium chloride from tablet to  sprinkle capsule formulation for ease of administration.  Patient Self Care Activities:  . Attends all scheduled provider appointments . Calls pharmacy for medication refills . Calls provider office for new concerns or questions  Initial goal documentation        Plan:  Telephone follow up appointment with care management team member scheduled for: 11/30 at 2:30 pm  Harlow Asa, PharmD, [place] [phone]

## 2019-03-04 NOTE — Patient Instructions (Signed)
Thank you allowing the Chronic Care Management Team to be a part of your care! It was a pleasure speaking with you today!     CCM (Chronic Care Management) Team    Janci Minor RN, BSN Nurse Care Coordinator  [phone]   Harlow Asa PharmD  Clinical Pharmacist  [phone]   [name] LCSW Clinical Social Worker [phone]  Visit Information  Goals Addressed            This Visit's Progress   . PharmD - Medication review       Current Barriers:  Marland Kitchen Knowledge deficits related to basic COPD self care/management . Knowledge deficit related to basic understanding of how to use inhalers and how inhaled medications work . Limited Social Support  Pharmacist Clinical Goal(s):  Marland Kitchen Over the next 30 days, patient will work with CM Pharmacist to address needs related to medication adherence and medication regimen optimization  Interventions: . Comprehensive medication review performed; medication list updated in electronic medical record o Reports osteoarthritis pain relief from scheduled acetaminophen. Denies currently taking aspirin. o Identify patient currently not taking Symbicort inhaler. Confusion about inhalers. Has been taking Proair, thinking it was her long-acting inhaler. Note both inhalers are red. - Counsel patient on maintenance (Symbicort) vs rescue inhaler (Proair) including how to use each inhaler and how to recognize the difference. Counsel patient on rinsing mouth out after each use of Symbicort. - Advise patient to pick up refill of Symbicort inhaler from pharmacy and resume taking as directed by Pulmonologist. o Counsel regarding caution for risk of sedation with gabapentin o Ms. Jesson admits to difficulty with swallowing potassium tablet whole. Reports currently takes potassium tablet by allowing to sit in yogurt for ~1 hour and then chewing tablet. Note extended release tablet should not be chewed - Counsel on alternative dosage forms exist, such  as a sprinkle capsule. Note potassium chloride (MicroK) 10 mEq capsule can be opened and contents sprinkled on soft food (applesauce, yogurt, etc) and immediately swallowed without chewing. Per patient's health plan formulary capsule formulation is also a tier 2 option. . Denies currently check blood pressure at home. 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  o Advise patient to check if BP monitor is working, if not, will aid patient with obtaining new monitor. . Coordination of care call to [place]. Symbicort inhaler last refiled 05/2018. Prescription active. Request pharmacy refill inhaler now.  . Will collaborate with PCP regarding switch of patient's potassium chloride from tablet to sprinkle capsule formulation for ease of administration.  Patient Self Care Activities:  . Attends all scheduled provider appointments . Calls pharmacy for medication refills . Calls provider office for new concerns or questions  Initial goal documentation        The patient verbalized understanding of instructions provided today and declined a print copy of patient instruction materials.   Telephone follow up appointment with care management team member scheduled for: 11/30 at 2:30 pm  Harlow Asa, PharmD, [place] [phone]

## 2019-03-18 ENCOUNTER — Ambulatory Visit: Payer: Medicare Other | Admitting: Pharmacist

## 2019-03-18 DIAGNOSIS — I10 Essential (primary) hypertension: Secondary | ICD-10-CM

## 2019-03-18 DIAGNOSIS — J449 Chronic obstructive pulmonary disease, unspecified: Secondary | ICD-10-CM

## 2019-03-18 NOTE — Patient Instructions (Signed)
Thank you allowing the Chronic Care Management Team to be a part of your care! It was a pleasure speaking with you today!     CCM (Chronic Care Management) Team    Janci Minor RN, BSN Nurse Care Coordinator  [phone]   Harlow Asa PharmD  Clinical Pharmacist  [phone]   [name] LCSW Clinical Social Worker [phone]  Visit Information  Goals Addressed            This Visit's Progress   . PharmD - Medication review       Current Barriers:  Marland Kitchen Knowledge deficits related to basic COPD self care/management . Knowledge deficit related to basic understanding of how to use inhalers and how inhaled medications work . Limited Social Support  Pharmacist Clinical Goal(s):  Marland Kitchen Over the next 30 days, patient will work with CM Pharmacist to address needs related to medication adherence and medication regimen optimization  Interventions: . Counsel patient on importance of adherence to inhalers o Reports picked up Symbicort and started using, but only 2 puffs QAM.  o Counsel on importance of using 2 puffs twice daily as directed and rinsing mouth out after each use. o Review use of Proair as rescue inhaler . Follow up regarding change of potassium to sprinkle capsule o Reports taking new dosage form of potassium, sprinkle capsule, as directed. States improvement of ease of administration with this form.  . 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 Counsel on over the counter benefit through health plan and encourage patient to contact to order new monitor   Patient Self Care Activities:  . Attends all scheduled provider appointments . Calls pharmacy for medication refills . Calls provider office for new concerns or questions  Please see past updates related to this goal by clicking on the "Past Updates" button in the selected goal          The patient verbalized understanding of instructions provided today and declined a print copy of patient instruction materials.   The care management team will reach out to the patient again over the next 30 days.   Harlow Asa, PharmD, [place] [employer] [phone]

## 2019-03-18 NOTE — Chronic Care Management (AMB) (Signed)
Chronic Care Management   Follow Up Note   03/18/2019 Name: Candace Tapia MRN: [id number] DOB: [birth year]  Referred by: Olin Hauser, DO Reason for referral : Chronic Care Management (Patient Phone Call)   Candace Tapia is a 83 y.o. year old female who is a primary care patient of Olin Hauser, DO. The CCM team was consulted for assistance with chronic disease management and care coordination needs.  [name] has a past medical history including but not limited to osteoarthritis, chronic vestibular disorder, COPD, prediabetes, hypertension, peripheral edema, hypokalemia, depression, CKD, allergic rhinitis, GERD and peripheral neuropathy.  I reached out to [name] by phone today.   Review of patient status, including review of consultants reports, relevant laboratory and other test results, and collaboration with appropriate care team members and the patient's provider was performed as part of comprehensive patient evaluation and provision of chronic care management services.     Outpatient Encounter Medications as of 03/18/2019  Medication Sig Note  . acetaminophen (TYLENOL) 650 MG CR tablet Take 650 mg by mouth every 8 (eight) hours as needed for pain.   Marland Kitchen albuterol (PROAIR HFA) 108 (90 BASE) MCG/ACT inhaler Inhale 1 puff into the lungs every 6 (six) hours as needed.    Marland Kitchen amLODipine (NORVASC) 5 MG tablet Take 1 tablet (5 mg total) by mouth daily. 03/04/2019: At bedtime  . aspirin 81 MG chewable tablet Chew 81 mg by mouth daily as needed.  01/01/2019: Takes every once in a while  . budesonide-formoterol (SYMBICORT) 160-4.5 MCG/ACT inhaler Inhale 2 puffs into the lungs 2 (two) times daily.   . chlorthalidone (HYGROTON) 25 MG tablet TAKE 1 TABLET BY MOUTH EVERY DAY   . Cholecalciferol (KP VITAMIN D3) 25 MCG (1000 UT) capsule Vitamin D3 25 mcg (1,000 unit) capsule  1 po qd   . fexofenadine (ALLEGRA ALLERGY) 180 MG tablet Take 180 mg by mouth as  needed. Reported on 09/29/2015   . fluticasone (FLONASE) 50 MCG/ACT nasal spray Place 1 spray into both nostrils as needed.    . gabapentin (NEURONTIN) 400 MG capsule TAKE 1 CAPSULE(400 MG) BY MOUTH TWICE DAILY   . losartan (COZAAR) 100 MG tablet Take 1 tablet (100 mg total) by mouth daily.   . meclizine (ANTIVERT) 25 MG tablet Take 0.5 tablets (12.5 mg total) by mouth every 8 (eight) hours as needed for dizziness.   . Multiple Vitamins-Minerals (MULTIVITAMIN WOMEN 50+) TABS Take 1 tablet by mouth daily.    Marland Kitchen NEXIUM 20 MG capsule Take 1 capsule (20 mg total) by mouth daily.   . potassium chloride (KLOR-CON SPRINKLE) 10 MEQ CR capsule Take 1 capsule (10 mEq total) by mouth daily. Open capsule, sprinkle over soft food, swallow whole.    No facility-administered encounter medications on file as of 03/18/2019.     Goals Addressed            This Visit's Progress   . PharmD - Medication review       Current Barriers:  Marland Kitchen Knowledge deficits related to basic COPD self care/management . Knowledge deficit related to basic understanding of how to use inhalers and how inhaled medications work . Limited Social Support  Pharmacist Clinical Goal(s):  Marland Kitchen Over the next 30 days, patient will work with CM Pharmacist to address needs related to medication adherence and medication regimen optimization  Interventions: . Counsel patient on importance of adherence to inhalers o Reports picked up Symbicort and started using, but only 2 puffs  QAM.  o Counsel on importance of using 2 puffs twice daily as directed and rinsing mouth out after each use. o Review use of Proair as rescue inhaler . Follow up regarding change of potassium to sprinkle capsule o Reports taking new dosage form of potassium, sprinkle capsule, as directed. States improvement of ease of administration with this form.  . Again counsel on benefit of checking home BP and keeping log o States that previous monitor is not working Interior and spatial designer  patient to obtain new upper blood pressure monitor . Counsel on over the counter benefit through health plan and encourage patient to contact to order new monitor   Patient Self Care Activities:  . Attends all scheduled provider appointments . Calls pharmacy for medication refills . Calls provider office for new concerns or questions  Please see past updates related to this goal by clicking on the "Past Updates" button in the selected goal         Plan  The care management team will reach out to the patient again over the next 30 days.   Harlow Asa, PharmD, [place] [employer] [phone]

## 2019-03-23 DIAGNOSIS — J449 Chronic obstructive pulmonary disease, unspecified: Secondary | ICD-10-CM | POA: Diagnosis not present

## 2019-03-25 ENCOUNTER — Telehealth: Payer: Medicare Other

## 2019-04-04 ENCOUNTER — Ambulatory Visit (INDEPENDENT_AMBULATORY_CARE_PROVIDER_SITE_OTHER): Payer: Medicare Other | Admitting: Licensed Clinical Social Worker

## 2019-04-04 DIAGNOSIS — F3342 Major depressive disorder, recurrent, in full remission: Secondary | ICD-10-CM

## 2019-04-04 DIAGNOSIS — I10 Essential (primary) hypertension: Secondary | ICD-10-CM | POA: Diagnosis not present

## 2019-04-04 DIAGNOSIS — J449 Chronic obstructive pulmonary disease, unspecified: Secondary | ICD-10-CM

## 2019-04-04 NOTE — Chronic Care Management (AMB) (Signed)
Chronic Care Management    Clinical Social Work Follow Up Note  04/04/2019 Name: Candace Tapia MRN: [id number] DOB: 06-19-1935  Candace Tapia is a 83 y.o. year old female who is a primary care patient of Candace Hauser, DO. The CCM team was consulted for assistance with Level of Care Concerns and Mental Health Counseling and Resources.   Review of patient status, including review of consultants reports, other relevant assessments, and collaboration with appropriate care team members and the patient's provider was performed as part of comprehensive patient evaluation and provision of chronic care management services.    SDOH (Social Determinants of Health) screening performed today: Depression  . See Care Plan for related entries.   Advanced Directives Status: <no information> See Care Plan for related entries.   Outpatient Encounter Medications as of 04/04/2019  Medication Sig Note  . acetaminophen (TYLENOL) 650 MG CR tablet Take 650 mg by mouth every 8 (eight) hours as needed for pain.   Marland Kitchen albuterol (PROAIR HFA) 108 (90 BASE) MCG/ACT inhaler Inhale 1 puff into the lungs every 6 (six) hours as needed.    Marland Kitchen amLODipine (NORVASC) 5 MG tablet Take 1 tablet (5 mg total) by mouth daily. 03/04/2019: At bedtime  . aspirin 81 MG chewable tablet Chew 81 mg by mouth daily as needed.  01/01/2019: Takes every once in a while  . budesonide-formoterol (SYMBICORT) 160-4.5 MCG/ACT inhaler Inhale 2 puffs into the lungs 2 (two) times daily.   . chlorthalidone (HYGROTON) 25 MG tablet TAKE 1 TABLET BY MOUTH EVERY DAY   . Cholecalciferol (KP VITAMIN D3) 25 MCG (1000 UT) capsule Vitamin D3 25 mcg (1,000 unit) capsule  1 po qd   . fexofenadine (ALLEGRA ALLERGY) 180 MG tablet Take 180 mg by mouth as needed. Reported on 09/29/2015   . fluticasone (FLONASE) 50 MCG/ACT nasal spray Place 1 spray into both nostrils as needed.    . gabapentin (NEURONTIN) 400 MG capsule TAKE 1 CAPSULE(400 MG) BY MOUTH  TWICE DAILY   . losartan (COZAAR) 100 MG tablet Take 1 tablet (100 mg total) by mouth daily.   . meclizine (ANTIVERT) 25 MG tablet Take 0.5 tablets (12.5 mg total) by mouth every 8 (eight) hours as needed for dizziness.   . Multiple Vitamins-Minerals (MULTIVITAMIN WOMEN 50+) TABS Take 1 tablet by mouth daily.    Marland Kitchen NEXIUM 20 MG capsule Take 1 capsule (20 mg total) by mouth daily.   . potassium chloride (KLOR-CON SPRINKLE) 10 MEQ CR capsule Take 1 capsule (10 mEq total) by mouth daily. Open capsule, sprinkle over soft food, swallow whole.    No facility-administered encounter medications on file as of 04/04/2019.     Goals Addressed    . "I really need LTC placement or in home support." (pt-stated)       Current Barriers:  . Financial constraints related to affording in home support or eventual long term care placement . Limited social support . Level of care concerns . ADL IADL limitations . Limited access to caregiver . Inability to perform ADL's independently . Inability to perform IADL's independently . Ongoing depression  Clinical Social Work Clinical Goal(s):  Marland Kitchen Over the next 120 days, patient will work with SW to address concerns related to gaining additional support in the home until patient is able to pursue LTC placement  Interventions: . Patient interviewed and appropriate assessments performed . Provided patient with information about long term care placement (ALF) and payment sources-out of pocket or Medicaid. LCSW also  provided education on personal care service resources within the area. Patient agreeable to C.H.O.R.E referral. . Discussed plans with patient for ongoing care management follow up and provided patient with direct contact information for care management team . Advised patient to consider finding in home support. Patient found an aide that cost $20 per hour but is not able to afford this service. LCSW encouraged patient to network to see if she can find someone  at a lesser rate. Nash Dimmer with RN Case Manager re: need for care management services . LCSW will make referral for C.H.O.R.E program on next outreach appointment** . Patient admits ongoing loneliness and depression. LCSW provided active and reflective listening throughout conversation. Patient was receptive to emotional support that was provided.  . Assisted patient/caregiver with obtaining information about health plan benefits . Provided education and assistance to client regarding Advanced Directives. . Provided education to patient/caregiver regarding level of care options.  Patient Self Care Activities:  . Self administers medications as prescribed . Attends all scheduled provider appointments . Unable to independently take care of self within the home and is in need of additional personal care service involvement.   Please see past updates related to this goal by clicking on the "Past Updates" button in the selected goal      Follow Up Plan: SW will follow up with patient by phone over the next quarter  [name], [place], MSW, [email or web address] Phone: [phone]

## 2019-04-16 ENCOUNTER — Ambulatory Visit: Payer: Medicare Other | Admitting: Pharmacist

## 2019-04-16 DIAGNOSIS — I10 Essential (primary) hypertension: Secondary | ICD-10-CM

## 2019-04-16 DIAGNOSIS — J449 Chronic obstructive pulmonary disease, unspecified: Secondary | ICD-10-CM

## 2019-04-16 NOTE — Chronic Care Management (AMB) (Signed)
Chronic Care Management   Follow Up Note   04/16/2019 Name: Candace Tapia MRN: [id number] DOB: 1936/02/24  Referred by: Olin Hauser, DO Reason for referral : Chronic Care Management (Patient Phone Call)   Candace Tapia is a 82 y.o. year old female who is a primary care patient of Olin Hauser, DO. The CCM team was consulted for assistance with chronic disease management and care coordination needs.  Candace Tapia has a past medical history including but not limited to osteoarthritis, chronic vestibular disorder, COPD, prediabetes, hypertension, peripheral edema, hypokalemia, depression, CKD, allergic rhinitis, GERD and peripheral neuropathy.  I reached out to Candace Tapia by phone today.   Review of patient status, including review of consultants reports, relevant laboratory and other test results, and collaboration with appropriate care team members and the patient's provider was performed as part of comprehensive patient evaluation and provision of chronic care management services.     Outpatient Encounter Medications as of 04/16/2019  Medication Sig Note  . acetaminophen (TYLENOL) 650 MG CR tablet Take 650 mg by mouth every 8 (eight) hours as needed for pain.   Marland Kitchen albuterol (PROAIR HFA) 108 (90 BASE) MCG/ACT inhaler Inhale 1 puff into the lungs every 6 (six) hours as needed.    Marland Kitchen amLODipine (NORVASC) 5 MG tablet Take 1 tablet (5 mg total) by mouth daily. 03/04/2019: At bedtime  . aspirin 81 MG chewable tablet Chew 81 mg by mouth daily as needed.  01/01/2019: Takes every once in a while  . budesonide-formoterol (SYMBICORT) 160-4.5 MCG/ACT inhaler Inhale 2 puffs into the lungs 2 (two) times daily.   . chlorthalidone (HYGROTON) 25 MG tablet TAKE 1 TABLET BY MOUTH EVERY DAY   . Cholecalciferol (KP VITAMIN D3) 25 MCG (1000 UT) capsule Vitamin D3 25 mcg (1,000 unit) capsule  1 po qd   . fexofenadine (ALLEGRA ALLERGY) 180 MG tablet Take 180 mg by mouth as  needed. Reported on 09/29/2015   . fluticasone (FLONASE) 50 MCG/ACT nasal spray Place 1 spray into both nostrils as needed.    . gabapentin (NEURONTIN) 400 MG capsule TAKE 1 CAPSULE(400 MG) BY MOUTH TWICE DAILY   . losartan (COZAAR) 100 MG tablet Take 1 tablet (100 mg total) by mouth daily.   . meclizine (ANTIVERT) 25 MG tablet Take 0.5 tablets (12.5 mg total) by mouth every 8 (eight) hours as needed for dizziness.   . Multiple Vitamins-Minerals (MULTIVITAMIN WOMEN 50+) TABS Take 1 tablet by mouth daily.    Marland Kitchen NEXIUM 20 MG capsule Take 1 capsule (20 mg total) by mouth daily.   . potassium chloride (KLOR-CON SPRINKLE) 10 MEQ CR capsule Take 1 capsule (10 mEq total) by mouth daily. Open capsule, sprinkle over soft food, swallow whole.    No facility-administered encounter medications on file as of 04/16/2019.    Goals Addressed   Current Barriers:  Marland Kitchen Knowledge deficits related to basic COPD self care/management . Knowledge deficit related to basic understanding of how to use inhalers and how inhaled medications work . Limited Social Support  Pharmacist Clinical Goal(s):  Marland Kitchen Over the next 30 days, patient will work with CM Pharmacist to address needs related to medication adherence and medication regimen optimization  Interventions: . Counsel patient on importance of adherence to inhalers o Candace Tapia again denies taking Symbicort consistently; reports using primarily on days when she is planning to be more active o Counsel on importance of using maintenance inhaler consistently, 2 puffs twice daily as directed, and rinsing  mouth out after each use. o Review use of Proair as rescue inhaler . Identify patient currently in need of refill of Symbicort inhaler and over the counter Vitamin D o Patient declines to review her other medications with me at this time. o Encourage patient to review her other bottles to see what refills she needs and then call pharmacy for refills, as well as pick up  Vitamin D . Again counsel on benefit of checking home BP and keeping log o Reports that she called [employer] over the counter (OTC) benefit to order new monitor after we last spoke, but has not received it or the catalogue that she requested o Encourage patient to call to follow up with OTC benefit to check on status of BP monitor order.  o Offer to place call to health plan with patient on the line, but patient declines.  Patient Self Care Activities:  . Attends all scheduled provider appointments . Calls pharmacy for medication refills . Calls provider office for new concerns or questions  Please see past updates related to this goal by clicking on the "Past Updates" button in the selected goal      Plan  The care management team will reach out to the patient again over the next 14 days.   Harlow Asa, PharmD, [place] [employer] [phone]

## 2019-04-16 NOTE — Patient Instructions (Signed)
Thank you allowing the Chronic Care Management Team to be a part of your care! It was a pleasure speaking with you today!     CCM (Chronic Care Management) Team    Janci Minor RN, BSN Nurse Care Coordinator  [phone]   Harlow Asa PharmD  Clinical Pharmacist  ([place] LCSW Clinical Social Worker [phone]  Visit Information  Goals Addressed   None     The patient verbalized understanding of instructions provided today and declined a print copy of patient instruction materials.   The care management team will reach out to the patient again over the next 14 days.   Harlow Asa, PharmD, [place] [employer] [phone]

## 2019-04-23 DIAGNOSIS — J449 Chronic obstructive pulmonary disease, unspecified: Secondary | ICD-10-CM | POA: Diagnosis not present

## 2019-04-25 ENCOUNTER — Telehealth: Payer: Medicare Other

## 2019-04-25 ENCOUNTER — Ambulatory Visit: Payer: Self-pay | Admitting: Pharmacist

## 2019-04-25 NOTE — Chronic Care Management (AMB) (Signed)
  Chronic Care Management   Follow Up Note   04/25/2019 Name: Candace Tapia MRN: [id number] DOB: [birth year]  Referred by: Olin Hauser, DO Reason for referral : Chronic Care Management (Patient Phone Call)   Candace Tapia is a 84 y.o. year old female who is a primary care patient of Olin Hauser, DO. The CCM team was consulted for assistance with chronic disease management and care coordination needs.    Was unable to reach patient via telephone today and have left HIPAA compliant voicemail asking patient to return my call.   Plan   The care management team will reach out to the patient again over the next 30 days.   Harlow Asa, PharmD, [place] [employer] [phone]

## 2019-05-06 ENCOUNTER — Telehealth: Payer: Self-pay

## 2019-05-22 ENCOUNTER — Ambulatory Visit (INDEPENDENT_AMBULATORY_CARE_PROVIDER_SITE_OTHER): Payer: Medicare Other | Admitting: Pharmacist

## 2019-05-22 DIAGNOSIS — F3342 Major depressive disorder, recurrent, in full remission: Secondary | ICD-10-CM | POA: Diagnosis not present

## 2019-05-22 DIAGNOSIS — J449 Chronic obstructive pulmonary disease, unspecified: Secondary | ICD-10-CM | POA: Diagnosis not present

## 2019-05-22 DIAGNOSIS — I10 Essential (primary) hypertension: Secondary | ICD-10-CM

## 2019-05-22 DIAGNOSIS — J4489 Other specified chronic obstructive pulmonary disease: Secondary | ICD-10-CM

## 2019-05-22 NOTE — Patient Instructions (Signed)
Thank you allowing the Chronic Care Management Team to be a part of your care! It was a pleasure speaking with you today!     CCM (Chronic Care Management) Team    Noreene Larsson RN, MSN, CCM Nurse Care Coordinator  [phone]   Harlow Asa PharmD  Clinical Pharmacist  [phone]   [name] LCSW Clinical Social Worker [phone]  Visit Information  Goals Addressed            This Visit's Progress   . PharmD - Medication review       Current Barriers:  Marland Kitchen Knowledge deficits related to basic COPD self care/management . Knowledge deficit related to basic understanding of how to use inhalers and how inhaled medications work   . Limited Social Support  Pharmacist Clinical Goal(s):  Marland Kitchen Over the next 30 days, patient will work with CM Pharmacist to address needs related to medication adherence and medication regimen optimization  Interventions: . Counsel patient on importance of medication adherence o Ms. Czaja confirms picked up over the counter Vitamin D and has resumed taking as directed o Reiterate counseling on importance of using maintenance inhaler consistently as directed, 2 puffs twice daily, and rinsing mouth out after each use. o Review use of Proair as rescue inhaler . Again counsel on benefit of checking home BP and keeping log o Reports that she received an over the counter (OTC) benefit card from [employer], but has had difficulty with activating it.  - States that she will get help from a family member to activate OTC card, but states that she prefers to pick up BP monitor from local pharmacy in the meantime. o Encourage patient to obtain an upper arm BP monitor rather than wrist monitor for greater accuracy of home readings o Encourage patient to keep a log of BP results and to plan to bring log to next PCP visit as reqeusted by provider  Patient Self Care Activities:  . Attends all scheduled provider appointments o Next visit with PCP on  3/24 o Next visit with Pulmonologist on 3/30 . Calls pharmacy for medication refills . Calls provider office for new concerns or questions  Please see past updates related to this goal by clicking on the "Past Updates" button in the selected goal         The patient verbalized understanding of instructions provided today and declined a print copy of patient instruction materials.   Telephone follow up appointment with care management team member scheduled for: 3/3 at 1 pm  Harlow Asa, PharmD, [place] [phone]

## 2019-05-22 NOTE — Chronic Care Management (AMB) (Signed)
Chronic Care Management   Follow Up Note   05/22/2019 Name: SHERLY REVELO MRN: [id number] DOB: [birth year]  Referred by: Olin Hauser, DO Reason for referral : Chronic Care Management (Patient Phone Call)   TRISH HETU is a 84 y.o. year old female who is a primary care patient of Olin Hauser, DO. The CCM team was consulted for assistance with chronic disease management and care coordination needs.  Ms. Minieri has a past medical history including but not limited to osteoarthritis, chronic vestibular disorder, COPD, prediabetes, hypertension, peripheral edema, hypokalemia, depression, CKD, allergic rhinitis, GERD and peripheral neuropathy.  I reached out to [name] by phone today.   Review of patient status, including review of consultants reports, relevant laboratory and other test results, and collaboration with appropriate care team members and the patient's provider was performed as part of comprehensive patient evaluation and provision of chronic care management services.      Outpatient Encounter Medications as of 05/22/2019  Medication Sig Note  . Cholecalciferol (KP VITAMIN D3) 25 MCG (1000 UT) capsule Vitamin D3 25 mcg (1,000 unit) capsule  1 po qd   . acetaminophen (TYLENOL) 650 MG CR tablet Take 650 mg by mouth every 8 (eight) hours as needed for pain.   Marland Kitchen albuterol (PROAIR HFA) 108 (90 BASE) MCG/ACT inhaler Inhale 1 puff into the lungs every 6 (six) hours as needed.    Marland Kitchen amLODipine (NORVASC) 5 MG tablet Take 1 tablet (5 mg total) by mouth daily. 03/04/2019: At bedtime  . aspirin 81 MG chewable tablet Chew 81 mg by mouth daily as needed.  01/01/2019: Takes every once in a while  . budesonide-formoterol (SYMBICORT) 160-4.5 MCG/ACT inhaler Inhale 2 puffs into the lungs 2 (two) times daily.   . chlorthalidone (HYGROTON) 25 MG tablet TAKE 1 TABLET BY MOUTH EVERY DAY   . fexofenadine (ALLEGRA ALLERGY) 180 MG tablet Take 180 mg by mouth as  needed. Reported on 09/29/2015   . fluticasone (FLONASE) 50 MCG/ACT nasal spray Place 1 spray into both nostrils as needed.    . gabapentin (NEURONTIN) 400 MG capsule TAKE 1 CAPSULE(400 MG) BY MOUTH TWICE DAILY   . losartan (COZAAR) 100 MG tablet Take 1 tablet (100 mg total) by mouth daily.   . meclizine (ANTIVERT) 25 MG tablet Take 0.5 tablets (12.5 mg total) by mouth every 8 (eight) hours as needed for dizziness.   . Multiple Vitamins-Minerals (MULTIVITAMIN WOMEN 50+) TABS Take 1 tablet by mouth daily.    Marland Kitchen NEXIUM 20 MG capsule Take 1 capsule (20 mg total) by mouth daily.   . potassium chloride (KLOR-CON SPRINKLE) 10 MEQ CR capsule Take 1 capsule (10 mEq total) by mouth daily. Open capsule, sprinkle over soft food, swallow whole.    No facility-administered encounter medications on file as of 05/22/2019.    Goals Addressed            This Visit's Progress   . PharmD - Medication review       Current Barriers:  Marland Kitchen Knowledge deficits related to basic COPD self care/management . Knowledge deficit related to basic understanding of how to use inhalers and how inhaled medications work   . Limited Social Support  Pharmacist Clinical Goal(s):  Marland Kitchen Over the next 30 days, patient will work with CM Pharmacist to address needs related to medication adherence and medication regimen optimization  Interventions: . Counsel patient on importance of medication adherence o Ms. Bones confirms picked up over the counter Vitamin D  and has resumed taking as directed o Reiterate counseling on importance of using maintenance inhaler consistently as directed, 2 puffs twice daily, and rinsing mouth out after each use. o Review use of Proair as rescue inhaler . Again counsel on benefit of checking home BP and keeping log o Reports that she received an over the counter (OTC) benefit card from [employer], but has had difficulty with activating it.  - States that she will get help from a family member to  activate OTC card, but states that she prefers to pick up BP monitor from local pharmacy in the meantime. o Encourage patient to obtain an upper arm BP monitor rather than wrist monitor for greater accuracy of home readings o Encourage patient to keep a log of BP results and to plan to bring log to next PCP visit as reqeusted by provider  Patient Self Care Activities:  . Attends all scheduled provider appointments o Next visit with PCP on 3/24 o Next visit with Pulmonologist on 3/30 . Calls pharmacy for medication refills . Calls provider office for new concerns or questions  Please see past updates related to this goal by clicking on the "Past Updates" button in the selected goal         Plan  Telephone follow up appointment with care management team member scheduled for: 3/3 at 1 pm  Harlow Asa, PharmD, [place] [phone]

## 2019-05-24 DIAGNOSIS — J449 Chronic obstructive pulmonary disease, unspecified: Secondary | ICD-10-CM | POA: Diagnosis not present

## 2019-06-06 ENCOUNTER — Ambulatory Visit: Payer: Self-pay | Admitting: General Practice

## 2019-06-06 ENCOUNTER — Telehealth: Payer: Medicare Other | Admitting: General Practice

## 2019-06-06 DIAGNOSIS — I10 Essential (primary) hypertension: Secondary | ICD-10-CM | POA: Diagnosis not present

## 2019-06-06 DIAGNOSIS — F3342 Major depressive disorder, recurrent, in full remission: Secondary | ICD-10-CM | POA: Diagnosis not present

## 2019-06-06 DIAGNOSIS — J449 Chronic obstructive pulmonary disease, unspecified: Secondary | ICD-10-CM

## 2019-06-06 DIAGNOSIS — F419 Anxiety disorder, unspecified: Secondary | ICD-10-CM

## 2019-06-06 NOTE — Patient Instructions (Signed)
Visit Information  Goals Addressed            This Visit's Progress    RNCM: I do the best I can and get by       Current Barriers:   Chronic Disease Management support, education, and care coordination needs related to HTN, COPD, Anxiety, and Depression  Clinical Goal(s) related to HTN, COPD, Anxiety, and Depression:  Over the next 90 days, patient will:   Work with the care management team to address educational, disease management, and care coordination needs   Begin or continue self health monitoring activities as directed today Measure and record blood pressure 4 times per week  Call provider office for new or worsened signs and symptoms Blood pressure findings outside established parameters, Oxygen saturation lower than established parameter, Shortness of breath, and New or worsened symptom related to Depression and anxiety  Call care management team with questions or concerns  Verbalize basic understanding of patient centered plan of care established today  Interventions related to HTN, COPD, Anxiety, and Depression:   Evaluation of current treatment plans and patient's adherence to plan as established by provider  Assessed patient understanding of disease states  Assessed patient's education and care coordination needs  Provided disease specific education to patient   Collaborated with appropriate clinical care team members regarding patient needs  Patient Self Care Activities related to HTN, COPD, Anxiety, and Depression:   Patient is unable to independently self-manage chronic health conditions  Initial goal documentation      RNCM: I would like to improve my activity tolerance (pt-stated)       Current Barriers:   Knowledge deficits related to basic COPD self care/management  Knowledge deficit related to basic understanding of how to use inhalers and how inhaled medications work  Transport planner barriers  Limited Social Support-Lives alone   Case  Manager Clinical Goal(s):  Over the next 90 days, patient will verbalize basic understanding of COPD disease process and self care activities   Interventions:   Provided patient with basic written and verbal COPD education on self care/management/and exacerbation prevention   Review and discussion about medications patient revealed she is only using inhalers occasionally.   Review of patient reports of decreased activity tolerance related to being out of breath, lots of arthritis pain, and dizziness, which leads to depression. (reports she has dealt with depression for awhile). The patient encouraged to do leg lifts and other non-exerting exercises to help keep muscle strength  Discussed with patient her openness to a pharmacy consult to discuss and review medications. Completed with [name]  Discussed plans with patient for ongoing care management follow up and provided patient with direct contact information for care management team  Transportation provided by daughter in law only on days she does not work, Care guide referral for transportation needs  Review of Patient reports not able to go out or socialize with friends but does talk to them on the phone  Evaluation of the patient having a blood pressure device. She has an old one. Education on utilization of the OTC products benefit through [facility] to get a new BP cuff  Evaluation of life alert system. The patient has a life alert system     Patient Self Care Activities:   Performs ADL's independently  Unable to perform IADLs independently  Please see past updates related to this goal by clicking on the "Past Updates" button in the selected goal         Patient verbalizes  understanding of instructions provided today.   The care management team will reach out to the patient again over the next 30 to 60 days.   Noreene Larsson RN, MSN, [place] Mobile: [phone]

## 2019-06-06 NOTE — Chronic Care Management (AMB) (Signed)
Chronic Care Management   Follow Up Note   06/06/2019 Name: Candace Tapia MRN: [id number] DOB: Dec 10, 1935  Referred by: Olin Hauser, DO Reason for referral : Chronic Care Management (Follow up: COPD/Anxiety /Depressionactivity)   Candace Tapia is a 84 y.o. year old female who is a primary care patient of Olin Hauser, DO. The CCM team was consulted for assistance with chronic disease management and care coordination needs.    Review of patient status, including review of consultants reports, relevant laboratory and other test results, and collaboration with appropriate care team members and the patient's provider was performed as part of comprehensive patient evaluation and provision of chronic care management services.    SDOH (Social Determinants of Health) assessments performed: Yes SDOH Interventions     Most Recent Value  SDOH Interventions  SDOH Interventions for the Following Domains  Physical Activity  Physical Activity Interventions  Other (Comments) [No activity/ Educated on doing leg lifts and non-exerting exercises to keep muscle strength]       Outpatient Encounter Medications as of 06/06/2019  Medication Sig Note   acetaminophen (TYLENOL) 650 MG CR tablet Take 650 mg by mouth every 8 (eight) hours as needed for pain.    albuterol (PROAIR HFA) 108 (90 BASE) MCG/ACT inhaler Inhale 1 puff into the lungs every 6 (six) hours as needed.     amLODipine (NORVASC) 5 MG tablet Take 1 tablet (5 mg total) by mouth daily. 03/04/2019: At bedtime   aspirin 81 MG chewable tablet Chew 81 mg by mouth daily as needed.  01/01/2019: Takes every once in a while   budesonide-formoterol (SYMBICORT) 160-4.5 MCG/ACT inhaler Inhale 2 puffs into the lungs 2 (two) times daily.    chlorthalidone (HYGROTON) 25 MG tablet TAKE 1 TABLET BY MOUTH EVERY DAY    Cholecalciferol (KP VITAMIN D3) 25 MCG (1000 UT) capsule Vitamin D3 25 mcg (1,000 unit) capsule  1 po qd     fexofenadine (ALLEGRA ALLERGY) 180 MG tablet Take 180 mg by mouth as needed. Reported on 09/29/2015    fluticasone (FLONASE) 50 MCG/ACT nasal spray Place 1 spray into both nostrils as needed.     gabapentin (NEURONTIN) 400 MG capsule TAKE 1 CAPSULE(400 MG) BY MOUTH TWICE DAILY    losartan (COZAAR) 100 MG tablet Take 1 tablet (100 mg total) by mouth daily.    meclizine (ANTIVERT) 25 MG tablet Take 0.5 tablets (12.5 mg total) by mouth every 8 (eight) hours as needed for dizziness.    Multiple Vitamins-Minerals (MULTIVITAMIN WOMEN 50+) TABS Take 1 tablet by mouth daily.     NEXIUM 20 MG capsule Take 1 capsule (20 mg total) by mouth daily.    potassium chloride (KLOR-CON SPRINKLE) 10 MEQ CR capsule Take 1 capsule (10 mEq total) by mouth daily. Open capsule, sprinkle over soft food, swallow whole.    No facility-administered encounter medications on file as of 06/06/2019.     Objective:   Goals Addressed            This Visit's Progress    RNCM: I do the best I can and get by       Current Barriers:   Chronic Disease Management support, education, and care coordination needs related to HTN, COPD, Anxiety, and Depression  Clinical Goal(s) related to HTN, COPD, Anxiety, and Depression:  Over the next 90 days, patient will:   Work with the care management team to address educational, disease management, and care coordination needs   Begin or  continue self health monitoring activities as directed today Measure and record blood pressure 4 times per week  Call provider office for new or worsened signs and symptoms Blood pressure findings outside established parameters, Oxygen saturation lower than established parameter, Shortness of breath, and New or worsened symptom related to Depression and anxiety  Call care management team with questions or concerns  Verbalize basic understanding of patient centered plan of care established today  Interventions related to HTN, COPD, Anxiety, and  Depression:   Evaluation of current treatment plans and patient's adherence to plan as established by provider  Assessed patient understanding of disease states  Assessed patient's education and care coordination needs  Provided disease specific education to patient   Collaborated with appropriate clinical care team members regarding patient needs  Patient Self Care Activities related to HTN, COPD, Anxiety, and Depression:   Patient is unable to independently self-manage chronic health conditions  Initial goal documentation      RNCM: I would like to improve my activity tolerance (pt-stated)       Current Barriers:   Knowledge deficits related to basic COPD self care/management  Knowledge deficit related to basic understanding of how to use inhalers and how inhaled medications work  Transport planner barriers  Limited Social Support-Lives alone   Case Manager Clinical Goal(s):  Over the next 90 days, patient will verbalize basic understanding of COPD disease process and self care activities   Interventions:   Provided patient with basic written and verbal COPD education on self care/management/and exacerbation prevention   Review and discussion about medications patient revealed she is only using inhalers occasionally.   Review of patient reports of decreased activity tolerance related to being out of breath, lots of arthritis pain, and dizziness, which leads to depression. (reports she has dealt with depression for awhile). The patient encouraged to do leg lifts and other non-exerting exercises to help keep muscle strength  Discussed with patient her openness to a pharmacy consult to discuss and review medications. Completed with Candace Tapia  Discussed plans with patient for ongoing care management follow up and provided patient with direct contact information for care management team  Transportation provided by daughter in law only on days she does not work, Care guide  referral for transportation needs  Review of Patient reports not able to go out or socialize with friends but does talk to them on the phone  Evaluation of the patient having a blood pressure device. She has an old one. Education on utilization of the OTC products benefit through [facility] to get a new BP cuff  Evaluation of life alert system. The patient has a life alert system     Patient Self Care Activities:   Performs ADL's independently  Unable to perform IADLs independently  Please see past updates related to this goal by clicking on the "Past Updates" button in the selected goal          Plan:   The care management team will reach out to the patient again over the next 30 to 60 days.    Noreene Larsson RN, MSN, [place] Mobile: [phone]

## 2019-06-19 ENCOUNTER — Telehealth: Payer: Medicare Other

## 2019-06-19 ENCOUNTER — Ambulatory Visit: Payer: Self-pay | Admitting: Pharmacist

## 2019-06-19 NOTE — Chronic Care Management (AMB) (Signed)
  Chronic Care Management   Follow Up Note   06/19/2019 Name: AZUSA BODENHEIMER MRN: [id number] DOB: [birth year]  Referred by: Olin Hauser, DO Reason for referral : Chronic Care Management (Patient Phone Call)   XOCHILT TRUBY is a 84 y.o. year old female who is a primary care patient of Olin Hauser, DO. The CCM team was consulted for assistance with chronic disease management and care coordination needs.    Was unable to reach patient via telephone today and have left HIPAA compliant voicemail asking patient to return my call.   Plan   The care management team will reach out to the patient again over the next 30 days.   Harlow Asa, PharmD, [place] [employer] [phone]

## 2019-06-20 ENCOUNTER — Ambulatory Visit (INDEPENDENT_AMBULATORY_CARE_PROVIDER_SITE_OTHER): Payer: Medicare Other | Admitting: Licensed Clinical Social Worker

## 2019-06-20 DIAGNOSIS — G894 Chronic pain syndrome: Secondary | ICD-10-CM

## 2019-06-20 DIAGNOSIS — J449 Chronic obstructive pulmonary disease, unspecified: Secondary | ICD-10-CM | POA: Diagnosis not present

## 2019-06-20 DIAGNOSIS — I10 Essential (primary) hypertension: Secondary | ICD-10-CM

## 2019-06-20 DIAGNOSIS — F419 Anxiety disorder, unspecified: Secondary | ICD-10-CM

## 2019-06-20 NOTE — Chronic Care Management (AMB) (Signed)
  Care Management   Follow Up Note   06/20/2019 Name: Candace Tapia MRN: [id number] DOB: 1935-11-26  Referred by: Candace Hauser, DO Reason for referral : [name] is a 84 y.o. year old female who is a primary care patient of Candace Hauser, DO. The care management team was consulted for assistance with care management and care coordination needs.    Review of patient status, including review of consultants reports, relevant laboratory and other test results, and collaboration with appropriate care team members and the patient's provider was performed as part of comprehensive patient evaluation and provision of chronic care management services.    Goals Addressed    . "I really need LTC placement or in home support." (pt-stated)       Current Barriers:  . Financial constraints related to affording in home support or eventual long term care placement . Limited social support . Level of care concerns . ADL IADL limitations . Limited access to caregiver . Inability to perform ADL's independently . Inability to perform IADL's independently . Ongoing depression  Clinical Social Work Clinical Goal(s):  Marland Kitchen Over the next 120 days, patient will work with SW to address concerns related to gaining additional support in the home until patient is able to pursue LTC placement  Interventions: . Patient interviewed and appropriate assessments performed . Provided patient with information about long term care placement (ALF) and payment sources-out of pocket or Medicaid. LCSW also provided education on personal care service resources within the area. Patient agreeable to C.H.O.R.E referral. . Discussed plans with patient for ongoing care management follow up and provided patient with direct contact information for care management team . Advised patient to consider finding in home support. Patient found an aide that cost $20 per hour but is not able to  afford this service. LCSW encouraged patient to network to see if she can find someone at a lesser rate. Candace Tapia with RN Case Manager re: need for care management services . Patient admits ongoing loneliness and depression. LCSW provided active and reflective listening throughout conversation. Patient was receptive to emotional support that was provided.  . Assisted patient/caregiver with obtaining information about health plan benefits . Provided education and assistance to client regarding Advanced Directives. . Provided education to patient/caregiver regarding level of care options. Marland Kitchen UPDATE- LCSW successfully coordinated care with Home Care Providers and was able to place patient on the wait list for C.H.O.R.E program on 06/20/19.  Patient Self Care Activities:  . Self administers medications as prescribed . Attends all scheduled provider appointments . Unable to independently take care of self within the home and is in need of additional personal care service involvement.   Please see past updates related to this goal by clicking on the "Past Updates" button in the selected goal      The care management team will reach out to the patient again over the next 45-60 days.   [name], BSW, MSW, [email or web address] Phone: [phone]

## 2019-06-21 DIAGNOSIS — J449 Chronic obstructive pulmonary disease, unspecified: Secondary | ICD-10-CM | POA: Diagnosis not present

## 2019-07-05 ENCOUNTER — Ambulatory Visit: Payer: Self-pay | Admitting: Pharmacist

## 2019-07-05 ENCOUNTER — Telehealth: Payer: Medicare Other

## 2019-07-05 NOTE — Chronic Care Management (AMB) (Signed)
  Chronic Care Management   Follow Up Note   07/05/2019 Name: Candace Tapia MRN: [id number] DOB: Nov 28, 1935  Referred by: Olin Hauser, DO Reason for referral : Chronic Care Management (Patient Phone Call)   Candace Tapia is a 84 y.o. year old female who is a primary care patient of Olin Hauser, DO. The CCM team was consulted for assistance with chronic disease management and care coordination needs.    Was unable to reach patient via telephone today and have left HIPAA compliant voicemail asking patient to return my call. Outreach attempt #2.   Plan   The care management team will reach out to the patient again over the next 30 days.   Harlow Asa, PharmD, [place] [employer] [phone]

## 2019-07-10 ENCOUNTER — Other Ambulatory Visit: Payer: Self-pay

## 2019-07-10 ENCOUNTER — Ambulatory Visit (INDEPENDENT_AMBULATORY_CARE_PROVIDER_SITE_OTHER): Payer: Medicare Other | Admitting: Family Medicine

## 2019-07-10 VITALS — BP 103/63 | HR 99 | Temp 98.4°F | Resp 16 | Ht 65.5 in | Wt 219.6 lb

## 2019-07-10 DIAGNOSIS — R7303 Prediabetes: Secondary | ICD-10-CM

## 2019-07-10 DIAGNOSIS — E669 Obesity, unspecified: Secondary | ICD-10-CM

## 2019-07-10 DIAGNOSIS — R600 Localized edema: Secondary | ICD-10-CM

## 2019-07-10 DIAGNOSIS — I129 Hypertensive chronic kidney disease with stage 1 through stage 4 chronic kidney disease, or unspecified chronic kidney disease: Secondary | ICD-10-CM

## 2019-07-10 DIAGNOSIS — N182 Chronic kidney disease, stage 2 (mild): Secondary | ICD-10-CM

## 2019-07-10 DIAGNOSIS — I10 Essential (primary) hypertension: Secondary | ICD-10-CM | POA: Diagnosis not present

## 2019-07-10 DIAGNOSIS — F3342 Major depressive disorder, recurrent, in full remission: Secondary | ICD-10-CM

## 2019-07-10 DIAGNOSIS — J432 Centrilobular emphysema: Secondary | ICD-10-CM

## 2019-07-10 DIAGNOSIS — I872 Venous insufficiency (chronic) (peripheral): Secondary | ICD-10-CM

## 2019-07-10 LAB — POCT GLYCOSYLATED HEMOGLOBIN (HGB A1C): Hemoglobin A1C: 5.7 % — AB (ref 4.0–5.6)

## 2019-07-10 MED ORDER — FUROSEMIDE 20 MG PO TABS: 20.0000 mg | ORAL_TABLET | Freq: Every day | ORAL | 3 refills | Status: DC

## 2019-07-10 MED ORDER — AMLODIPINE BESYLATE 5 MG PO TABS: 5.0000 mg | ORAL_TABLET | Freq: Every day | ORAL | 1 refills | Status: DC

## 2019-07-10 MED ORDER — LOSARTAN POTASSIUM 100 MG PO TABS: 100.0000 mg | ORAL_TABLET | Freq: Every day | ORAL | 1 refills | Status: DC

## 2019-07-10 NOTE — Assessment & Plan Note (Signed)
Remains improved mood, in remission - she does seem lonely and frustrated but she seems to be coping well, has limited social support. Chronic history of MDD  Coordinate with CCM team regarding her concerns today, may benefit from other community program, unfortunately may have limited access during [place] now, and she has not received vaccine yet.  Follow-up as planned

## 2019-07-10 NOTE — Assessment & Plan Note (Signed)
Weight stable BMI >35 Encourage diet lifetstyle

## 2019-07-10 NOTE — Assessment & Plan Note (Signed)
Chronic problem, currently stable without flare Followed by [facility] On Symbicort and Albuterol

## 2019-07-10 NOTE — Assessment & Plan Note (Signed)
Followed by Vascular Dr Lucky Cowboy Chronic problem LE venous reflux Failed Thiaizde. Temporary benefit on Chlorthalidone but having low BP and dizziness  Discussed edema and her BP symptoms Trial now to DISCONTINUE Chlorthalidone 25mg  daily Instead will try Furosemide 20mg  PRN daily for edema, instead of a daily route for edema, she is having less swelling flares since not on her feet and active all the time, now swelling is improved and seems to be dizzy - may be due to chlorthalidone, and BP is low normal.  Continue RICE therapy.

## 2019-07-10 NOTE — Progress Notes (Signed)
Subjective:    Patient ID: Candace Tapia, female    DOB: April 14, 1936, 84 y.o.   MRN: [id number]  Candace Tapia is a 84 y.o. female presenting on 07/10/2019 for COPD and Pre-DM   HPI   Pre-Diabetes / obesity BMI >35 Reports no concerns. Last A1ctrend 5.7. Due today for A1c Meds:Never on meds Weight up +1 lbs Limited diet and exercise, admits difficulty exercising due to back hip pain arthritis, and often still eating sweets at time Denies hypoglycemia, polyuria, visual changes, numbness or tingling.  CHRONIC HTN / Peripheral Edema / Hypokalemia Reports no concerns. BP readings low normal. Not checking regularly Admits lightheaded dizzy at times. Now she is not as active not having as much swelling since not on her feet. Current Meds - Losartan 100mg  daily, Chlorthalidone 25mg  daily, Amlodipine 5mg  daily   Reports good compliance, took meds today. Taking Potassium 56mEq daily now  Chronic Vestibular disorder Followed by [facility] On meclizine PRN infrequent use, request new rx, has not returned to ENT lately She has had some dizziness episodes lightheadedness  Recurrent Depression, - in remission Reports she has prior history of some depressed mood but overall has managed to cope with it for long time and currently doing well. She says she is alone. She has limited family members around. She has some friends, one with alzheimer's dementia. Cannot tolerate medication, remains off  Centrilobular Emphysema (COPD) Followed by Dr Minerva [facility] On oxygen at night, nocturnal up to 2L Using inhalers ContinueSymbicort 160/4.5 two puffs bid, albuterolPRN She is doing well but often can get short of breath with exertion and using mask as well at times.  Health Maintenance: Due for COVID19 vaccine. She did not know where to get it. [facility].  Depression screen [facility] 2/9 07/10/2019 06/06/2019 01/09/2019  Decreased Interest 0 0 0  Down, Depressed,  Hopeless 0 0 0  PHQ - 2 Score 0 0 0  Altered sleeping 0 - 0  Tired, decreased energy 0 - 0  Change in appetite 0 - 0  Feeling bad or failure about yourself  0 - 0  Trouble concentrating 0 - 0  Moving slowly or fidgety/restless 0 - 0  Suicidal thoughts 0 - 0  PHQ-9 Score 0 - 0  Difficult doing work/chores Not difficult at all - Not difficult at all  Some recent data might be hidden   No flowsheet data found.    Social History   Tobacco Use  . Smoking status: Former Smoker    Packs/day: 2.00    Years: 40.00    Pack years: 80.00    Quit date: 04/19/1993    Years since quitting: 26.2  . Smokeless tobacco: Never Used  Substance Use Topics  . Alcohol use: No    Alcohol/week: 0.0 standard drinks  . Drug use: No    Review of Systems Per HPI unless specifically indicated above     Objective:    BP 103/63   Pulse 99   Temp 98.4 F (36.9 C) (Oral)   Resp 16   Ht 5' 5.5" (1.664 m)   Wt 219 lb 9.6 oz (99.6 kg)   SpO2 96%   BMI 35.99 kg/m   Wt Readings from Last 3 Encounters:  07/10/19 219 lb 9.6 oz (99.6 kg)  01/09/19 218 lb (98.9 kg)  01/01/19 216 lb 9.6 oz (98.2 kg)    Physical Exam Vitals and nursing note reviewed.  Constitutional:  General: She is not in acute distress.    Appearance: She is well-developed. She is not diaphoretic.     Comments: Well-appearing, comfortable, cooperative  HENT:     Head: Normocephalic and atraumatic.  Eyes:     General:        Right eye: No discharge.        Left eye: No discharge.     Conjunctiva/sclera: Conjunctivae normal.  Neck:     Thyroid: No thyromegaly.  Cardiovascular:     Rate and Rhythm: Normal rate and regular rhythm.     Heart sounds: Normal heart sounds. No murmur.  Pulmonary:     Effort: Pulmonary effort is normal. No respiratory distress.     Breath sounds: Normal breath sounds. No wheezing or rales.  Musculoskeletal:        General: Normal range of motion.     Cervical back: Normal range of motion  and neck supple.     Right lower leg: Edema (varicose veins, +1-2 pitting edema) present.     Left lower leg: Edema (varicose veins, +1-2 pitting edema) present.  Lymphadenopathy:     Cervical: No cervical adenopathy.  Skin:    General: Skin is warm and dry.     Findings: No erythema or rash.  Neurological:     Mental Status: She is alert and oriented to person, place, and time.  Psychiatric:        Behavior: Behavior normal.     Comments: Well groomed, good eye contact, normal speech and thoughts      Recent Labs    01/02/19 0820 07/10/19 1437  HGBA1C 5.7* 5.7*    Results for orders placed or performed in visit on 07/10/19  POCT HgB A1C  Result Value Ref Range   Hemoglobin A1C 5.7 (A) 4.0 - 5.6 %      Assessment & Plan:   Problem List Items Addressed This Visit    Pre-diabetes - Primary    Well controlled PreDM A1c 5.7 stable Concern with obesity, HTN, HLD  Concern with neuropathy symptoms  Plan:  1. Not on any therapy currently  2. Encourage improved lifestyle - low carb, low sugar diet, reduce portion size, continue improving regular exercise      Relevant Orders   POCT HgB A1C (Completed)   Obesity (BMI 35.0-39.9 without comorbidity)    Weight stable BMI >35 Encourage diet lifetstyle      Major depression in full remission ([place])    Remains improved mood, in remission - she does seem lonely and frustrated but she seems to be coping well, has limited social support. Chronic history of MDD  Coordinate with CCM team regarding her concerns today, may benefit from other community program, unfortunately may have limited access during [place] now, and she has not received vaccine yet.  Follow-up as planned      Chronic venous insufficiency    Followed by Vascular Dr Lucky Cowboy Chronic problem LE venous reflux Failed Thiaizde. Temporary benefit on Chlorthalidone but having low BP and dizziness  Discussed edema and her BP symptoms Trial now to DISCONTINUE  Chlorthalidone 25mg  daily Instead will try Furosemide 20mg  PRN daily for edema, instead of a daily route for edema, she is having less swelling flares since not on her feet and active all the time, now swelling is improved and seems to be dizzy - may be due to chlorthalidone, and BP is low normal.  Continue RICE therapy.      Relevant Medications   losartan (  COZAAR) 100 MG tablet   furosemide (LASIX) 20 MG tablet   amLODipine (NORVASC) 5 MG tablet   Centrilobular emphysema (HCC)    Chronic problem, currently stable without flare Followed by [facility] On Symbicort and Albuterol      Bilateral lower extremity edema   Relevant Medications   furosemide (LASIX) 20 MG tablet   Benign hypertension with CKD (chronic kidney disease), stage II    Controlled HTN - low normal BP, some dizziness, concern side effect chlorthalidone - Home BP readings improved Complication with CKD-II    Plan:  See A&P for venous insufficiency - DISCONTINUE Chlorthalidone 25mg  daily - START Furosemide 20mg  daily PRN only Continue current BP regimen Losartan 100mg  daily, Amlodipine 5mg   Encourage improved lifestyle - low sodium diet, regular exercise Continue monitor BP outside office, bring readings to next visit, if persistently >140/90 or new symptoms notify office sooner      Relevant Medications   losartan (COZAAR) 100 MG tablet   furosemide (LASIX) 20 MG tablet   amLODipine (NORVASC) 5 MG tablet     Sent detailed message to our CCM team Nurse CM, Social Work, Pharmacy to review several concerns, med change as listed above and monitor her BP. Also she expresses loneliness, not clinically depressed actively but she expresses loneliness from lack of support system and may benefit from additional resources.    Orders Placed This Encounter  Procedures  . POCT HgB A1C     Meds ordered this encounter  Medications  . losartan (COZAAR) 100 MG tablet    Sig: Take 1 tablet (100 mg total) by mouth  daily.    Dispense:  90 tablet    Refill:  1  . furosemide (LASIX) 20 MG tablet    Sig: Take 1 tablet (20 mg total) by mouth daily.    Dispense:  30 tablet    Refill:  3  . amLODipine (NORVASC) 5 MG tablet    Sig: Take 1 tablet (5 mg total) by mouth daily.    Dispense:  90 tablet    Refill:  1      Follow up plan: Return in about 3 months (around 10/10/2019) for 3 month HTN, Swelling.   Nobie Putnam, [facility] 07/10/2019, 2:32 PM

## 2019-07-10 NOTE — Assessment & Plan Note (Signed)
Well controlled PreDM A1c 5.7 stable Concern with obesity, HTN, HLD  Concern with neuropathy symptoms  Plan:  1. Not on any therapy currently  2. Encourage improved lifestyle - low carb, low sugar diet, reduce portion size, continue improving regular exercise

## 2019-07-10 NOTE — Patient Instructions (Addendum)
Thank you for coming to the office today.  STOP Taking Chlorthalidone 25mg  daily (BP and Fluid pill).  I am concerned BP is too low making you dizzy.  Now only take Amlodipine 5mg  daily and Losartan 100mg  daily for BP.  ADD NEW medicine Furosemide (Lasix) 20mg  once a day ONLY AS NEEDED for swelling.  ------------------------------   Call [facility] for [facility] Vaccine information and how to schedule  [facility] AA:340493)  ---------------------------  [place]:  [facility]) [place] [address]  Hours: Monday - Sunday 8:00am to 12:00pm  COVID-19 Vaccines By Appointment Only  Sign up for [place] List  [email or web address] /// call [phone]   Please schedule a Follow-up Appointment to: Return in about 3 months (around 10/10/2019) for 3 month HTN, Swelling.  If you have any other questions or concerns, please feel free to call the office or send a message through [place]. You may also schedule an earlier appointment if necessary.  Additionally, you may be receiving a survey about your experience at our office within a few days to 1 week by e-mail or mail. We value your feedback.  Nobie Putnam, DO [place]

## 2019-07-10 NOTE — Assessment & Plan Note (Signed)
Controlled HTN - low normal BP, some dizziness, concern side effect chlorthalidone - Home BP readings improved Complication with CKD-II    Plan:  See A&P for venous insufficiency - DISCONTINUE Chlorthalidone 25mg  daily - START Furosemide 20mg  daily PRN only Continue current BP regimen Losartan 100mg  daily, Amlodipine 5mg   Encourage improved lifestyle - low sodium diet, regular exercise Continue monitor BP outside office, bring readings to next visit, if persistently >140/90 or new symptoms notify office sooner

## 2019-07-11 ENCOUNTER — Other Ambulatory Visit: Payer: Self-pay | Admitting: Family Medicine

## 2019-07-11 DIAGNOSIS — I10 Essential (primary) hypertension: Secondary | ICD-10-CM

## 2019-07-15 ENCOUNTER — Telehealth: Payer: Self-pay | Admitting: Family Medicine

## 2019-07-15 ENCOUNTER — Encounter: Payer: Self-pay | Admitting: Family Medicine

## 2019-07-15 NOTE — Telephone Encounter (Signed)
   K[facility] 07/15/2019  Name: Candace Tapia   MRN: [id number]   DOB: [birth year]   AGE: 84 y.o.   GENDER: female   PCP Olin Hauser, DO.   Called pt regarding [employer] Referral for transportation.  Pt stated that she does still drive on occasion and her daughter-in-law takes her to most appointments. We discussed [employer] as part of her benefit through [facility] insurance. She asked that I mail her the information so she could keep it for future reference.  Closing referral pending any other needs of patient.  [place] . [email or web address]  [phone]

## 2019-07-16 DIAGNOSIS — R0600 Dyspnea, unspecified: Secondary | ICD-10-CM | POA: Diagnosis not present

## 2019-07-16 DIAGNOSIS — J449 Chronic obstructive pulmonary disease, unspecified: Secondary | ICD-10-CM | POA: Diagnosis not present

## 2019-07-19 ENCOUNTER — Telehealth: Payer: Medicare Other

## 2019-07-19 ENCOUNTER — Ambulatory Visit: Payer: Self-pay | Admitting: Pharmacist

## 2019-07-19 DIAGNOSIS — I10 Essential (primary) hypertension: Secondary | ICD-10-CM

## 2019-07-19 NOTE — Chronic Care Management (AMB) (Signed)
Chronic Care Management   Follow Up Note   07/19/2019 Name: Candace Tapia MRN: [id number] DOB: 09/24/1935  Referred by: Candace Hauser, DO Reason for referral : Chronic Care Management (Patient Phone Call) and Candace Tapia is a 84 y.o. year old female who is a primary care patient of Candace Hauser, DO. The CCM team was consulted for assistance with chronic disease management and care coordination needs.    Receive coordination of care message from PCP.  Was unable to reach patient via telephone today and have left HIPAA compliant voicemail asking patient to return my call. (unsuccessful outreach #3)   Review of patient status, including review of consultants reports, relevant laboratory and other test results, and collaboration with appropriate care team members and the patient's provider was performed as part of comprehensive patient evaluation and provision of chronic care management services.    Outpatient Encounter Medications as of 07/19/2019  Medication Sig Note  . acetaminophen (TYLENOL) 650 MG CR tablet Take 650 mg by mouth every 8 (eight) hours as needed for pain.   Marland Kitchen albuterol (PROAIR HFA) 108 (90 BASE) MCG/ACT inhaler Inhale 1 puff into the lungs every 6 (six) hours as needed.    Marland Kitchen amLODipine (NORVASC) 5 MG tablet Take 1 tablet (5 mg total) by mouth daily.   Marland Kitchen aspirin 81 MG chewable tablet Chew 81 mg by mouth daily as needed.  01/01/2019: Takes every once in a while  . budesonide-formoterol (SYMBICORT) 160-4.5 MCG/ACT inhaler Inhale 2 puffs into the lungs 2 (two) times daily.   . Cholecalciferol (KP VITAMIN D3) 25 MCG (1000 UT) capsule Vitamin D3 25 mcg (1,000 unit) capsule  1 po qd   . fexofenadine (ALLEGRA ALLERGY) 180 MG tablet Take 180 mg by mouth as needed. Reported on 09/29/2015   . fluticasone (FLONASE) 50 MCG/ACT nasal spray Place 1 spray into both nostrils as needed.    . furosemide (LASIX) 20 MG tablet Take 1 tablet (20  mg total) by mouth daily.   Marland Kitchen gabapentin (NEURONTIN) 400 MG capsule TAKE 1 CAPSULE(400 MG) BY MOUTH TWICE DAILY   . losartan (COZAAR) 100 MG tablet Take 1 tablet (100 mg total) by mouth daily.   . meclizine (ANTIVERT) 25 MG tablet Take 0.5 tablets (12.5 mg total) by mouth every 8 (eight) hours as needed for dizziness.   . Multiple Vitamins-Minerals (MULTIVITAMIN WOMEN 50+) TABS Take 1 tablet by mouth daily.    Marland Kitchen NEXIUM 20 MG capsule Take 1 capsule (20 mg total) by mouth daily.   . potassium chloride (KLOR-CON SPRINKLE) 10 MEQ CR capsule Take 1 capsule (10 mEq total) by mouth daily. Open capsule, sprinkle over soft food, swallow whole.    No facility-administered encounter medications on file as of 07/19/2019.    Goals Addressed            This Visit's Progress   . PharmD - Medication review       Current Barriers:  Marland Kitchen Knowledge deficits related to basic COPD self care/management . Knowledge deficit related to basic understanding of how to use inhalers and how inhaled medications work   . Limited Social Support  Pharmacist Clinical Goal(s):  Marland Kitchen Over the next 30 days, patient will work with CM Pharmacist to address needs related to medication adherence and medication regimen optimization  Interventions: . Receive coordination of care message from PCP and perform chart review o Patient seen by provider on 3/24 - Patient to DISCONTINUE chlortalidone 25 mg  daily - Patient to START furosemide 20 md daily PRN only - Patient to continue losartan 100 mg daily and amlodipine 5 mg daily - Patient to continue to monitor BP outside office, bring readings to next visit, if persistently >140/90 or new symptoms notify office sooner . Unable to reach patient by phone again today . Send message to Care Guide asking for assistance with scheduling next telephone appointment with patient.   Patient Self Care Activities:  . Attends all scheduled provider appointments . Calls pharmacy for medication  refills . Calls provider office for new concerns or questions  Please see past updates related to this goal by clicking on the "Past Updates" button in the selected goal         Plan   Sent message to Care Guide asking for assistance with scheduling next telephone appointment with patient.   Candace Tapia, PharmD, [place] [employer] [phone]

## 2019-07-22 ENCOUNTER — Telehealth: Payer: Self-pay | Admitting: Family Medicine

## 2019-07-22 NOTE — Chronic Care Management (AMB) (Signed)
  Care Management   Note  07/22/2019 Name: Candace Tapia MRN: [id number] DOB: Jun 26, 1935  Candace Tapia is a 84 y.o. year old female who is a primary care patient of Olin Hauser, DO and is actively engaged with the care management team. I reached out to Candace Tapia by phone today to assist with re-scheduling a follow up visit with the Pharmacist  Follow up plan: Telephone appointment with care management team member scheduled for:07/29/2019  Noreene Larsson, [place], [place], [place] [address] Direct Dial: [phone] [email or web address] Website: [email or web address]

## 2019-07-25 ENCOUNTER — Ambulatory Visit: Payer: Self-pay | Admitting: General Practice

## 2019-07-25 ENCOUNTER — Telehealth: Payer: Medicare Other

## 2019-07-25 NOTE — Chronic Care Management (AMB) (Signed)
  Chronic Care Management   Outreach Note  07/25/2019 Name: Candace Tapia MRN: [id number] DOB: 08-31-1935  Referred by: Olin Hauser, DO Reason for referral : Chronic Care Management (Follow up on Chronic diseases and care coordination needs)   An unsuccessful telephone outreach was attempted today. The patient was referred to the case management team for assistance with care management and care coordination.   Follow Up Plan: A HIPPA compliant phone message was left for the patient providing contact information and requesting a return call.   Noreene Larsson RN, MSN, [place] Mobile: [phone]

## 2019-07-29 ENCOUNTER — Ambulatory Visit (INDEPENDENT_AMBULATORY_CARE_PROVIDER_SITE_OTHER): Payer: Medicare Other | Admitting: Pharmacist

## 2019-07-29 ENCOUNTER — Other Ambulatory Visit: Payer: Self-pay | Admitting: Family Medicine

## 2019-07-29 DIAGNOSIS — J432 Centrilobular emphysema: Secondary | ICD-10-CM

## 2019-07-29 DIAGNOSIS — I129 Hypertensive chronic kidney disease with stage 1 through stage 4 chronic kidney disease, or unspecified chronic kidney disease: Secondary | ICD-10-CM

## 2019-07-29 DIAGNOSIS — I10 Essential (primary) hypertension: Secondary | ICD-10-CM

## 2019-07-29 MED ORDER — CHLORTHALIDONE 25 MG PO TABS: 25.0000 mg | ORAL_TABLET | Freq: Every day | ORAL | 1 refills | Status: DC

## 2019-07-29 NOTE — Patient Instructions (Signed)
Thank you allowing the Chronic Care Management Team to be a part of your care! It was a pleasure speaking with you today!     CCM (Chronic Care Management) Team    Noreene Larsson RN, MSN, CCM Nurse Care Coordinator  [phone]   Harlow Asa PharmD  Clinical Pharmacist  [phone]   [name] LCSW Clinical Social Worker [phone]  Visit Information  Goals Addressed            This Visit's Progress   . PharmD - Medication review       Current Barriers:  Marland Kitchen Knowledge deficits related to basic COPD self care/management . Knowledge deficit related to basic understanding of how to use inhalers and how inhaled medications work   . Limited Social Support  Pharmacist Clinical Goal(s):  Marland Kitchen Over the next 30 days, patient will work with CM Pharmacist to address needs related to medication adherence and medication regimen optimization  Interventions: . Received coordination of care message from PCP and perform chart review o Patient seen by provider on 3/24 - Patient to DISCONTINUE chlortalidone 25 mg daily - Patient to START furosemide 20 md daily PRN only - Patient to continue losartan 100 mg daily and amlodipine 5 mg daily - Patient to continue to monitor BP outside office, bring readings to next visit, if persistently >140/90 or new symptoms notify office sooner . Counsel patient on importance of BP control and monitoring ? Reports taking: ? Amlodipine 5 mg once daily ? Losartan 100 mg once daily ? Reports restarted chlorthalidone 25 mg once daily TODAY ? Denies taking furosemide  ? Confirms obtained home upper arm BP monitor. However, reports was unsure of how to use devices herself. Had son check BP for her yesterday, 160/84, but no other readings ? Counsel patient on importance of being able to check BP herself. Patient to review with son how to use monitor and begin monitoring herself ? Counsel on BP monitoring technique ? Reports that she stopped taking  chlorthalidone as directed by PCP at last appointment. However, reports that she restarted taking the chlorthalidone this morning, as she denies noticing any change in dizziness since having stopped. ? Reports dizziness is usually in morning, notes more frequent when raining outside ? Confirms that she makes positional changes slowly and uses walker in home ? Reports uses meclizine occiasionally, but is not sure if notices a difference in dizziness ? Denies recent falls . Counsel patient on importance of medication adherence ? Identify patient using inhalers incorrectly - using albuterol inhaler, rather than Symbicort, regularly. ? Counsel on importance of using maintenance inhaler (Symbicort) and rescue (albuerol) inhalers as directed and rinsing out mouth after use of Symbicort ? Encourage patient to call pharmacy today for refill of Symbicort inhaler ? Confirms continuing to use weekly pillbox . Collaborate with PCP regarding patient's BP medicaiton management . Follow up with patient. Advise patient that per PCP, she to continue chlorthalidone 25 mg daily (in addition to current dose of amlodipine and losartan).  ? Patient verbalizes understaning via teach back of this plan and states that she will set aside/not take the furosemide ? Patient needs of refill of chlorthalidone - will send message to PCP requesting new Rx to pharmacy . Patient confirms that she contacted pharmacy for refill of Symbicort inhaler to use as directed.  Patient Self Care Activities:  . Attends all scheduled provider appointments . Calls pharmacy for medication refills . Calls provider office for new concerns or questions  Please see past updates related to this goal by clicking on the "Past Updates" button in the selected goal         Patient verbalizes understanding of instructions provided today.   Telephone follow up appointment with care management team member scheduled for: 4/26 at 9 am  Harlow Asa, PharmD, [place] [phone]

## 2019-07-29 NOTE — Addendum Note (Signed)
Addended by: Olin Hauser on: 07/29/2019 12:53 PM   Modules accepted: Orders

## 2019-07-29 NOTE — Chronic Care Management (AMB) (Signed)
Chronic Care Management   Follow Up Note   07/29/2019 Name: Candace Tapia MRN: [id number] DOB: 16-May-1935  Referred by: Candace Hauser, DO Reason for referral : Chronic Care Management (Patient Phone Call)   Candace Tapia is a 84 y.o. year old female who is a primary care patient of Candace Hauser, DO. The CCM team was consulted for assistance with chronic disease management and care coordination needs.  Candace Tapia has a past medical history including but not limited to osteoarthritis, chronic vestibular disorder, COPD, prediabetes, hypertension, peripheral edema, hypokalemia, depression, CKD, allergic rhinitis, GERD and peripheral neuropathy.  I reached out to Candace Tapia by phone today.   Review of patient status, including review of consultants reports, relevant laboratory and other test results, and collaboration with appropriate care team members and the patient's provider was performed as part of comprehensive patient evaluation and provision of chronic care management services.     Outpatient Encounter Medications as of 07/29/2019  Medication Sig Note  . albuterol (PROAIR HFA) 108 (90 BASE) MCG/ACT inhaler Inhale 1 puff into the lungs every 6 (six) hours as needed.    Marland Kitchen amLODipine (NORVASC) 5 MG tablet Take 1 tablet (5 mg total) by mouth daily.   . budesonide-formoterol (SYMBICORT) 160-4.5 MCG/ACT inhaler Inhale 2 puffs into the lungs 2 (two) times daily.   . fexofenadine (ALLEGRA ALLERGY) 180 MG tablet Take 180 mg by mouth as needed. Reported on 09/29/2015   . fluticasone (FLONASE) 50 MCG/ACT nasal spray Place 1 spray into both nostrils as needed.    Marland Kitchen losartan (COZAAR) 100 MG tablet Take 1 tablet (100 mg total) by mouth daily.   . meclizine (ANTIVERT) 25 MG tablet Take 0.5 tablets (12.5 mg total) by mouth every 8 (eight) hours as needed for dizziness.   Marland Kitchen acetaminophen (TYLENOL) 650 MG CR tablet Take 650 mg by mouth every 8 (eight) hours as needed  for pain.   Marland Kitchen aspirin 81 MG chewable tablet Chew 81 mg by mouth daily as needed.  01/01/2019: Takes every once in a while  . Cholecalciferol (KP VITAMIN D3) 25 MCG (1000 UT) capsule Vitamin D3 25 mcg (1,000 unit) capsule  1 po qd   . furosemide (LASIX) 20 MG tablet Take 1 tablet (20 mg total) by mouth daily. (Patient not taking: Reported on 07/29/2019)   . gabapentin (NEURONTIN) 400 MG capsule TAKE 1 CAPSULE(400 MG) BY MOUTH TWICE DAILY   . Multiple Vitamins-Minerals (MULTIVITAMIN WOMEN 50+) TABS Take 1 tablet by mouth daily.    Marland Kitchen NEXIUM 20 MG capsule Take 1 capsule (20 mg total) by mouth daily.   . potassium chloride (KLOR-CON SPRINKLE) 10 MEQ CR capsule Take 1 capsule (10 mEq total) by mouth daily. Open capsule, sprinkle over soft food, swallow whole.    No facility-administered encounter medications on file as of 07/29/2019.    Goals Addressed            This Visit's Progress   . PharmD - Medication review       Current Barriers:  Marland Kitchen Knowledge deficits related to basic COPD self care/management . Knowledge deficit related to basic understanding of how to use inhalers and how inhaled medications work   . Limited Social Support  Pharmacist Clinical Goal(s):  Marland Kitchen Over the next 30 days, patient will work with CM Pharmacist to address needs related to medication adherence and medication regimen optimization  Interventions: . Received coordination of care message from PCP and perform chart review o Patient  seen by provider on 3/24 - Patient to DISCONTINUE chlortalidone 25 mg daily - Patient to START furosemide 20 md daily PRN only - Patient to continue losartan 100 mg daily and amlodipine 5 mg daily - Patient to continue to monitor BP outside office, bring readings to next visit, if persistently >140/90 or new symptoms notify office sooner . Counsel patient on importance of BP control and monitoring ? Reports taking: ? Amlodipine 5 mg once daily ? Losartan 100 mg once daily ? Reports  restarted chlorthalidone 25 mg once daily TODAY ? Denies taking furosemide  ? Confirms obtained home upper arm BP monitor. However, reports was unsure of how to use devices herself. Had son check BP for her yesterday, 160/84, but no other readings ? Counsel patient on importance of being able to check BP herself. Patient to review with son how to use monitor and begin monitoring herself ? Counsel on BP monitoring technique ? Reports that she stopped taking chlorthalidone as directed by PCP at last appointment. However, reports that she restarted taking the chlorthalidone this morning, as she denies noticing any change in dizziness since having stopped. ? Reports dizziness is usually in morning, notes more frequent when raining outside ? Confirms that she makes positional changes slowly and uses walker in home ? Reports uses meclizine occiasionally, but is not sure if notices a difference in dizziness ? Denies recent falls . Counsel patient on importance of medication adherence ? Identify patient using inhalers incorrectly - using albuterol inhaler, rather than Symbicort, regularly. ? Counsel on importance of using maintenance inhaler (Symbicort) and rescue (albuerol) inhalers as directed and rinsing out mouth after use of Symbicort ? Encourage patient to call pharmacy today for refill of Symbicort inhaler ? Confirms continuing to use weekly pillbox . Collaborate with PCP regarding patient's BP medicaiton management . Follow up with patient. Advise patient that per PCP, she to continue chlorthalidone 25 mg daily (in addition to current dose of amlodipine and losartan).  ? Patient verbalizes understaning via teach back of this plan and states that she will set aside/not take the furosemide ? Patient needs of refill of chlorthalidone - will send message to PCP requesting new Rx to pharmacy . Patient confirms that she contacted pharmacy for refill of Symbicort inhaler to use as directed.  Patient  Self Care Activities:  . Attends all scheduled provider appointments . Calls pharmacy for medication refills . Calls provider office for new concerns or questions  Please see past updates related to this goal by clicking on the "Past Updates" button in the selected goal         Plan  Telephone follow up appointment with care management team member scheduled for: 4/26 at 9 am  Harlow Asa, PharmD, [place] [phone]

## 2019-08-12 ENCOUNTER — Ambulatory Visit: Payer: Medicare Other | Admitting: Pharmacist

## 2019-08-12 DIAGNOSIS — F3342 Major depressive disorder, recurrent, in full remission: Secondary | ICD-10-CM | POA: Diagnosis not present

## 2019-08-12 DIAGNOSIS — J432 Centrilobular emphysema: Secondary | ICD-10-CM | POA: Diagnosis not present

## 2019-08-12 DIAGNOSIS — I10 Essential (primary) hypertension: Secondary | ICD-10-CM | POA: Diagnosis not present

## 2019-08-12 DIAGNOSIS — I129 Hypertensive chronic kidney disease with stage 1 through stage 4 chronic kidney disease, or unspecified chronic kidney disease: Secondary | ICD-10-CM

## 2019-08-12 DIAGNOSIS — J449 Chronic obstructive pulmonary disease, unspecified: Secondary | ICD-10-CM | POA: Diagnosis not present

## 2019-08-12 DIAGNOSIS — N182 Chronic kidney disease, stage 2 (mild): Secondary | ICD-10-CM | POA: Diagnosis not present

## 2019-08-12 NOTE — Patient Instructions (Addendum)
Thank you allowing the Chronic Care Management Team to be a part of your care! It was a pleasure speaking with you today!     CCM (Chronic Care Management) Team    Noreene Larsson RN, MSN, CCM Nurse Care Coordinator  [phone]   Harlow Asa PharmD  Clinical Pharmacist  [phone]   [name] LCSW Clinical Social Worker [phone]  Visit Information  Goals Addressed            This Visit's Progress   . PharmD - Medication review       CARE PLAN ENTRY (see longitudinal plan of care for additional care plan information)  Current Barriers:  Marland Kitchen Knowledge deficits related to basic COPD self care/management . Knowledge deficit related to basic understanding of how to use inhalers and how inhaled medications work   . Limited Social Support  Pharmacist Clinical Goal(s):  Marland Kitchen Over the next 30 days, patient will work with CM Pharmacist to address needs related to medication adherence and medication regimen optimization  Interventions: . Counsel patient on importance of BP control and monitoring ? Reports taking: ? Amlodipine 5 mg once daily ? Losartan 100 mg once daily ? Chlorthalidone 25 mg once daily ? Reports the following recent BP readings: ? 4/22: 166/88, HR 83  ? 4/23: 167/95, HR 79 ? 4/24: 151/90, HR 71; 179/99, HR 78 (unsure of timing) ? 4/25: 145/79, HR 88 ? Counsel patient on BP monitoring technique, particularly importance of resting prior to taking readings and avoiding caffeine >30 minutes prior to readings ? Patient reports drinking Mountain Dew soda throughout the day ? Counsel patient on the impact of caffeine on blood pressure and encourage patient to reduce caffeine intake ? Remind patient to follow up with PCP office if BP remains persistently >140/90 or new symptoms occur, as directed by PCP . Follow up with patient regarding dizziness ? Reports continues to have dizziness, usually in morning ? Confirms making positional changes slowly and  uses walker in home . Counsel patient on importance of medication adherence ? Again counsel on importance of using maintenance inhaler (Symbicort) and rescue (albuerol) inhalers as directed and rinsing out mouth after use of Symbicort ? Confirms continuing to use weekly pillbox . Will mail patient BP log and handout on BP monitoring technique as requested.  Patient Self Care Activities:  . Attends all scheduled provider appointments . Calls pharmacy for medication refills . Calls provider office for new concerns or questions  Please see past updates related to this goal by clicking on the "Past Updates" button in the selected goal         Patient verbalizes understanding of instructions provided today.    Telephone follow up appointment with care management team member scheduled for: 5/10 at 10:30 am  Harlow Asa, PharmD, [place] [phone]

## 2019-08-12 NOTE — Chronic Care Management (AMB) (Signed)
Chronic Care Management   Follow Up Note   08/12/2019 Name: Candace Tapia MRN: [id number] DOB: [birth year]  Referred by: Olin Hauser, DO Reason for referral : Chronic Care Management (Patient Phone Call)   TYRIANA GREMS is a 84 y.o. year old female who is a primary care patient of Olin Hauser, DO. The CCM team was consulted for assistance with chronic disease management and care coordination needs.  Ms. Boaz has a past medical history including but not limited to osteoarthritis, chronic vestibular disorder, COPD, prediabetes, hypertension, peripheral edema, hypokalemia, depression, CKD, allergic rhinitis, GERD and peripheral neuropathy.  I reached out to [name] by phone today.   Review of patient status, including review of consultants reports, relevant laboratory and other test results, and collaboration with appropriate care team members and the patient's provider was performed as part of comprehensive patient evaluation and provision of chronic care management services.      Outpatient Encounter Medications as of 08/12/2019  Medication Sig Note  . albuterol (PROAIR HFA) 108 (90 BASE) MCG/ACT inhaler Inhale 1 puff into the lungs every 6 (six) hours as needed.    Marland Kitchen amLODipine (NORVASC) 5 MG tablet Take 1 tablet (5 mg total) by mouth daily.   . chlorthalidone (HYGROTON) 25 MG tablet Take 1 tablet (25 mg total) by mouth daily.   Marland Kitchen losartan (COZAAR) 100 MG tablet Take 1 tablet (100 mg total) by mouth daily.   Marland Kitchen acetaminophen (TYLENOL) 650 MG CR tablet Take 650 mg by mouth every 8 (eight) hours as needed for pain.   Marland Kitchen aspirin 81 MG chewable tablet Chew 81 mg by mouth daily as needed.  01/01/2019: Takes every once in a while  . budesonide-formoterol (SYMBICORT) 160-4.5 MCG/ACT inhaler Inhale 2 puffs into the lungs 2 (two) times daily.   . Cholecalciferol (KP VITAMIN D3) 25 MCG (1000 UT) capsule Vitamin D3 25 mcg (1,000 unit) capsule  1 po qd   .  fexofenadine (ALLEGRA ALLERGY) 180 MG tablet Take 180 mg by mouth as needed. Reported on 09/29/2015   . fluticasone (FLONASE) 50 MCG/ACT nasal spray Place 1 spray into both nostrils as needed.    . furosemide (LASIX) 20 MG tablet Take 1 tablet (20 mg total) by mouth daily. (Patient not taking: Reported on 07/29/2019)   . gabapentin (NEURONTIN) 400 MG capsule TAKE 1 CAPSULE(400 MG) BY MOUTH TWICE DAILY   . meclizine (ANTIVERT) 25 MG tablet Take 0.5 tablets (12.5 mg total) by mouth every 8 (eight) hours as needed for dizziness.   . Multiple Vitamins-Minerals (MULTIVITAMIN WOMEN 50+) TABS Take 1 tablet by mouth daily.    Marland Kitchen NEXIUM 20 MG capsule Take 1 capsule (20 mg total) by mouth daily.   . potassium chloride (KLOR-CON SPRINKLE) 10 MEQ CR capsule Take 1 capsule (10 mEq total) by mouth daily. Open capsule, sprinkle over soft food, swallow whole.    No facility-administered encounter medications on file as of 08/12/2019.    Goals Addressed            This Visit's Progress   . PharmD - Medication review       CARE PLAN ENTRY (see longitudinal plan of care for additional care plan information)  Current Barriers:  Marland Kitchen Knowledge deficits related to basic COPD self care/management . Knowledge deficit related to basic understanding of how to use inhalers and how inhaled medications work   . Limited Social Support  Pharmacist Clinical Goal(s):  Marland Kitchen Over the next 30 days, patient  will work with CM Pharmacist to address needs related to medication adherence and medication regimen optimization  Interventions: . Counsel patient on importance of BP control and monitoring ? Reports taking: ? Amlodipine 5 mg once daily ? Losartan 100 mg once daily ? Chlorthalidone 25 mg once daily ? Reports the following recent BP readings: ? 4/22: 166/88, HR 83  ? 4/23: 167/95, HR 79 ? 4/24: 151/90, HR 71; 179/99, HR 78 (unsure of timing) ? 4/25: 145/79, HR 88 ? Counsel patient on BP monitoring technique,  particularly importance of resting prior to taking readings and avoiding caffeine >30 minutes prior to readings ? Patient reports drinking Mountain Dew soda throughout the day ? Counsel patient on the impact of caffeine on blood pressure and encourage patient to reduce caffeine intake ? Remind patient to follow up with PCP office if BP remains persistently >140/90 or new symptoms occur, as directed by PCP . Follow up with patient regarding dizziness ? Reports continues to have dizziness, usually in morning ? Confirms making positional changes slowly and uses walker in home . Counsel patient on importance of medication adherence ? Again counsel on importance of using maintenance inhaler (Symbicort) and rescue (albuerol) inhalers as directed and rinsing out mouth after use of Symbicort ? Confirms continuing to use weekly pillbox . Will mail patient BP log and handout on BP monitoring technique as requested.  Patient Self Care Activities:  . Attends all scheduled provider appointments . Calls pharmacy for medication refills . Calls provider office for new concerns or questions  Please see past updates related to this goal by clicking on the "Past Updates" button in the selected goal         Plan   Telephone follow up appointment with care management team member scheduled for: 5/10 at 10:30 am  Harlow Asa, PharmD, [place] [phone]

## 2019-08-15 ENCOUNTER — Telehealth: Payer: Medicare Other | Admitting: General Practice

## 2019-08-15 ENCOUNTER — Ambulatory Visit: Payer: Self-pay | Admitting: General Practice

## 2019-08-15 DIAGNOSIS — J432 Centrilobular emphysema: Secondary | ICD-10-CM | POA: Diagnosis not present

## 2019-08-15 DIAGNOSIS — F3342 Major depressive disorder, recurrent, in full remission: Secondary | ICD-10-CM

## 2019-08-15 DIAGNOSIS — N182 Chronic kidney disease, stage 2 (mild): Secondary | ICD-10-CM

## 2019-08-15 DIAGNOSIS — J449 Chronic obstructive pulmonary disease, unspecified: Secondary | ICD-10-CM

## 2019-08-15 DIAGNOSIS — I10 Essential (primary) hypertension: Secondary | ICD-10-CM

## 2019-08-15 DIAGNOSIS — I129 Hypertensive chronic kidney disease with stage 1 through stage 4 chronic kidney disease, or unspecified chronic kidney disease: Secondary | ICD-10-CM

## 2019-08-15 DIAGNOSIS — G894 Chronic pain syndrome: Secondary | ICD-10-CM

## 2019-08-15 NOTE — Chronic Care Management (AMB) (Signed)
Chronic Care Management   Follow Up Note   08/15/2019 Name: Candace Tapia MRN: [id number] DOB: 14-Jan-1936  Referred by: Olin Hauser, DO Reason for referral : Chronic Care Management (Initial Outreach: Referral from [facility]: Chronic Disease Management and Care Coordination needs)   Candace Tapia is a 84 y.o. year old female who is a primary care patient of Olin Hauser, DO. The CCM team was consulted for assistance with chronic disease management and care coordination needs.    Review of patient status, including review of consultants reports, relevant laboratory and other test results, and collaboration with appropriate care team members and the patient's provider was performed as part of comprehensive patient evaluation and provision of chronic care management services.    SDOH (Social Determinants of Health) assessments performed: Yes See Care Plan activities for detailed interventions related to [facility])     Outpatient Encounter Medications as of 08/15/2019  Medication Sig Note  . acetaminophen (TYLENOL) 650 MG CR tablet Take 650 mg by mouth every 8 (eight) hours as needed for pain.   Marland Kitchen albuterol (PROAIR HFA) 108 (90 BASE) MCG/ACT inhaler Inhale 1 puff into the lungs every 6 (six) hours as needed.    Marland Kitchen amLODipine (NORVASC) 5 MG tablet Take 1 tablet (5 mg total) by mouth daily.   Marland Kitchen aspirin 81 MG chewable tablet Chew 81 mg by mouth daily as needed.  01/01/2019: Takes every once in a while  . budesonide-formoterol (SYMBICORT) 160-4.5 MCG/ACT inhaler Inhale 2 puffs into the lungs 2 (two) times daily.   . chlorthalidone (HYGROTON) 25 MG tablet Take 1 tablet (25 mg total) by mouth daily.   . Cholecalciferol (KP VITAMIN D3) 25 MCG (1000 UT) capsule Vitamin D3 25 mcg (1,000 unit) capsule  1 po qd   . fexofenadine (ALLEGRA ALLERGY) 180 MG tablet Take 180 mg by mouth as needed. Reported on 09/29/2015   . fluticasone (FLONASE) 50 MCG/ACT nasal spray Place 1 spray into  both nostrils as needed.    . furosemide (LASIX) 20 MG tablet Take 1 tablet (20 mg total) by mouth daily. (Patient not taking: Reported on 07/29/2019)   . gabapentin (NEURONTIN) 400 MG capsule TAKE 1 CAPSULE(400 MG) BY MOUTH TWICE DAILY   . losartan (COZAAR) 100 MG tablet Take 1 tablet (100 mg total) by mouth daily.   . meclizine (ANTIVERT) 25 MG tablet Take 0.5 tablets (12.5 mg total) by mouth every 8 (eight) hours as needed for dizziness.   . Multiple Vitamins-Minerals (MULTIVITAMIN WOMEN 50+) TABS Take 1 tablet by mouth daily.    Marland Kitchen NEXIUM 20 MG capsule Take 1 capsule (20 mg total) by mouth daily.   . potassium chloride (KLOR-CON SPRINKLE) 10 MEQ CR capsule Take 1 capsule (10 mEq total) by mouth daily. Open capsule, sprinkle over soft food, swallow whole.    No facility-administered encounter medications on file as of 08/15/2019.     Objective:  BP Readings from Last 3 Encounters:  07/10/19 103/63  01/09/19 (!) 114/59  01/01/19 126/72    Goals Addressed            This Visit's Progress   . RNCM: I do the best I can and get by       Current Barriers:  . Chronic Disease Management support, education, and care coordination needs related to HTN, COPD, Anxiety, and Depression . Social Isolation/Limited support network . Limited Mobility  Clinical Goal(s) related to HTN, COPD, Anxiety, and Depression:  Over the next  120 days,  patient will:  . Work with the care management team to address educational, disease management, and care coordination needs  . Begin or continue self health monitoring activities as directed today Measure and record blood pressure 4 times per week . Call provider office for new or worsened signs and symptoms Blood pressure findings outside established parameters, Oxygen saturation lower than established parameter, Shortness of breath, and New or worsened symptom related to Depression and anxiety . Call care management team with questions or concerns . Verbalize  basic understanding of patient centered plan of care established today  Interventions related to HTN, COPD, Anxiety, and Depression:  . Evaluation of current treatment plans and patient's adherence to plan as established by provider.  The patient verbalized compliance with her plan of care. The patient does not take statins, review of notes reveal she can not tolerate and does not want to try a lower dose.  The patient does take ASA. Marland Kitchen Assessed patient understanding of disease states.  The patient verbalized understanding of her chronic conditions; however states she is "giddy" and forgetful.  Her biggest issue is social isolation and being by herself a lot.  . Assessed patient's education and care coordination needs.  The patient verbalized sometimes she thinks about going to an assisted living but she is unsure. Is receptive to talking to the care guide about assisted living places in her area. Will place a care guide referral for assistance.  . Provided disease specific education to patient.  Education on following a Heart Healthy/low carb diet.  The patient states she tries to watch her sodium content and fats and needs to lose some weight. She takes a fluid pill, denies any fluid in her feet and legs.   . Evaluation of depression specifically. The patient has a long standing history of this. She states medications make it worse.  Her one friend who she use to do things with is declining in health and memory problems therefore she does not have that anymore. She has not hobbies she likes to do. Talked to the patient about coping skills and possibly having outings with her daughter in law. She states her daughter in law works and this is not always possible.  Discussed audio books but the patient was unsure of this.  Nash Dimmer with appropriate clinical care team members regarding patient needs.  Patient is currently involved with LCSW and pharmacist.   Patient Self Care Activities related to HTN,  COPD, Anxiety, and Depression:  . Patient is unable to independently self-manage chronic health conditions  Please see past updates related to this goal by clicking on the "Past Updates" button in the selected goal      . COMPLETED: RNCM: I would like to improve my activity tolerance (pt-stated)       Current Barriers:  Marland Kitchen Knowledge deficits related to basic COPD self care/management . Knowledge deficit related to basic understanding of how to use inhalers and how inhaled medications work . Transportation barriers . Limited Social Support-Lives alone   Case Manager Clinical Goal(s):  Over the next 90 days, patient will verbalize basic understanding of COPD disease process and self care activities   Interventions:   Provided patient with basic written and verbal COPD education on self care/management/and exacerbation prevention   Review and discussion about medications patient revealed she is only using inhalers occasionally.   Review of patient reports of decreased activity tolerance related to being out of breath, lots of arthritis pain, and dizziness, which leads  to depression. (reports she has dealt with depression for awhile). The patient encouraged to do leg lifts and other non-exerting exercises to help keep muscle strength  Discussed with patient her openness to a pharmacy consult to discuss and review medications. Completed with [name]  Discussed plans with patient for ongoing care management follow up and provided patient with direct contact information for care management team  Transportation provided by daughter in law only on days she does not work, Care guide referral for transportation needs  Review of Patient reports not able to go out or socialize with friends but does talk to them on the phone  Evaluation of the patient having a blood pressure device. She has an old one. Education on utilization of the OTC products benefit through [facility] to get a new BP  cuff  Evaluation of life alert system. The patient has a life alert system     Patient Self Care Activities:  . Performs ADL's independently . Unable to perform IADLs independently  Please see past updates related to this goal by clicking on the "Past Updates" button in the selected goal          Plan:   The care management team will reach out to the patient again over the next 60 days.    Noreene Larsson RN, MSN, [place] Mobile: [phone]

## 2019-08-15 NOTE — Patient Instructions (Signed)
Visit Information  Goals Addressed            This Visit's Progress   . RNCM: I do the best I can and get by       Current Barriers:  . Chronic Disease Management support, education, and care coordination needs related to HTN, COPD, Anxiety, and Depression . Social Isolation/Limited support network . Limited Mobility  Clinical Goal(s) related to HTN, COPD, Anxiety, and Depression:  Over the next  120 days, patient will:  . Work with the care management team to address educational, disease management, and care coordination needs  . Begin or continue self health monitoring activities as directed today Measure and record blood pressure 4 times per week . Call provider office for new or worsened signs and symptoms Blood pressure findings outside established parameters, Oxygen saturation lower than established parameter, Shortness of breath, and New or worsened symptom related to Depression and anxiety . Call care management team with questions or concerns . Verbalize basic understanding of patient centered plan of care established today  Interventions related to HTN, COPD, Anxiety, and Depression:  . Evaluation of current treatment plans and patient's adherence to plan as established by provider.  The patient verbalized compliance with her plan of care. The patient does not take statins, review of notes reveal she can not tolerate and does not want to try a lower dose.  The patient does take ASA. Marland Kitchen Assessed patient understanding of disease states.  The patient verbalized understanding of her chronic conditions; however states she is "giddy" and forgetful.  Her biggest issue is social isolation and being by herself a lot.  . Assessed patient's education and care coordination needs.  The patient verbalized sometimes she thinks about going to an assisted living but she is unsure. Is receptive to talking to the care guide about assisted living places in her area. Will place a care guide referral for  assistance.  . Provided disease specific education to patient.  Education on following a Heart Healthy/low carb diet.  The patient states she tries to watch her sodium content and fats and needs to lose some weight. She takes a fluid pill, denies any fluid in her feet and legs.   . Evaluation of depression specifically. The patient has a long standing history of this. She states medications make it worse.  Her one friend who she use to do things with is declining in health and memory problems therefore she does not have that anymore. She has not hobbies she likes to do. Talked to the patient about coping skills and possibly having outings with her daughter in law. She states her daughter in law works and this is not always possible.  Discussed audio books but the patient was unsure of this.  Nash Dimmer with appropriate clinical care team members regarding patient needs.  Patient is currently involved with LCSW and pharmacist.   Patient Self Care Activities related to HTN, COPD, Anxiety, and Depression:  . Patient is unable to independently self-manage chronic health conditions  Please see past updates related to this goal by clicking on the "Past Updates" button in the selected goal      . COMPLETED: RNCM: I would like to improve my activity tolerance (pt-stated)       Current Barriers:  Marland Kitchen Knowledge deficits related to basic COPD self care/management . Knowledge deficit related to basic understanding of how to use inhalers and how inhaled medications work . Transportation barriers . Limited Social Support-Lives alone  Case Manager Clinical Goal(s):  Over the next 90 days, patient will verbalize basic understanding of COPD disease process and self care activities   Interventions:   Provided patient with basic written and verbal COPD education on self care/management/and exacerbation prevention   Review and discussion about medications patient revealed she is only using inhalers  occasionally.   Review of patient reports of decreased activity tolerance related to being out of breath, lots of arthritis pain, and dizziness, which leads to depression. (reports she has dealt with depression for awhile). The patient encouraged to do leg lifts and other non-exerting exercises to help keep muscle strength  Discussed with patient her openness to a pharmacy consult to discuss and review medications. Completed with [name]  Discussed plans with patient for ongoing care management follow up and provided patient with direct contact information for care management team  Transportation provided by daughter in law only on days she does not work, Care guide referral for transportation needs  Review of Patient reports not able to go out or socialize with friends but does talk to them on the phone  Evaluation of the patient having a blood pressure device. She has an old one. Education on utilization of the OTC products benefit through [facility] to get a new BP cuff  Evaluation of life alert system. The patient has a life alert system     Patient Self Care Activities:  . Performs ADL's independently . Unable to perform IADLs independently  Please see past updates related to this goal by clicking on the "Past Updates" button in the selected goal         Patient verbalizes understanding of instructions provided today.   The care management team will reach out to the patient again over the next 60 days.   Noreene Larsson RN, MSN, [place] Mobile: [phone]

## 2019-08-18 ENCOUNTER — Encounter: Payer: Self-pay | Admitting: Emergency Medicine

## 2019-08-18 ENCOUNTER — Other Ambulatory Visit: Payer: Self-pay

## 2019-08-18 ENCOUNTER — Ambulatory Visit
Admission: EM | Admit: 2019-08-18 | Discharge: 2019-08-18 | Disposition: A | Discharge status: Home / Self Care | Payer: Medicare Other | Attending: Emergency Medicine | Admitting: Emergency Medicine

## 2019-08-18 ENCOUNTER — Ambulatory Visit (INDEPENDENT_AMBULATORY_CARE_PROVIDER_SITE_OTHER): Payer: Medicare Other

## 2019-08-18 DIAGNOSIS — Z20822 Contact with and (suspected) exposure to covid-19: Secondary | ICD-10-CM

## 2019-08-18 DIAGNOSIS — R05 Cough: Secondary | ICD-10-CM | POA: Diagnosis not present

## 2019-08-18 DIAGNOSIS — J069 Acute upper respiratory infection, unspecified: Secondary | ICD-10-CM

## 2019-08-18 DIAGNOSIS — R0602 Shortness of breath: Secondary | ICD-10-CM | POA: Diagnosis not present

## 2019-08-18 MED ORDER — BENZONATATE 200 MG PO CAPS
200.0000 mg | ORAL_CAPSULE | Freq: Three times a day (TID) | ORAL | 0 refills | Status: DC | PRN
Start: 2019-08-18 — End: 2019-09-25

## 2019-08-18 MED ORDER — AEROCHAMBER PLUS MISC: 2 refills | Status: AC

## 2019-08-18 NOTE — ED Provider Notes (Signed)
HPI  SUBJECTIVE:  Candace Tapia is a 84 y.o. female who presents with 2 days of a "cold" with nasal congestion, rhinorrhea, cough.  No body aches, headaches, fevers.  Unsure if cough is productive, states that she is swallowing whenever she coughs up.  No loss of sense of smell or taste.  No chest pain.  No worsening shortness of breath above baseline.  No nausea, vomiting, diarrhea, abdominal pain.  No known exposure to Covid.  No Covid vaccination yet.  No antibiotics in the past 3 months.  She took an aspirin within 4 to 6 hours of evaluation.  She states that she is not needing to use her albuterol inhaler more.  States that she is compliant with her controller COPD medications.  She is not using a spacer as she states it "makes her dizzy".  She got a flu shot this year.  She tried Mucinex, and Flonase with improvement in her symptoms.  Symptoms are worse with exertion.  She has a past medical history of COPD, borderline diabetes, hypertension, chronic kidney disease, breast cancer in 2014.  No history of coronary artery disease, immunocompromise. PMD: Olin Hauser, DO   Past Medical History:  Diagnosis Date  . Allergy   . Arthritis   . Breast cancer ([facility]) 2014   radiation and taking exemestane  . Cancer of left breast ([facility]) 10/27/2014  . GERD (gastroesophageal reflux disease)   . H/O: hysterectomy   . History of lumpectomy 2014   LT breast    Past Surgical History:  Procedure Laterality Date  . ABDOMINAL HYSTERECTOMY    . BREAST LUMPECTOMY Left 2014  . BREAST SURGERY Left 2014  . JOINT REPLACEMENT    . LYMPH NODE DISSECTION     neck  . TOTAL SHOULDER REPLACEMENT Bilateral 2010 and 2013    Family History  Problem Relation Age of Onset  . Breast cancer Mother 90  . Diabetes Mother   . Kidney disease Father   . Diabetes Father   . Breast cancer Sister 18    Social History   Tobacco Use  . Smoking status: Former Smoker    Packs/day: 2.00    Years: 40.00     Pack years: 80.00    Quit date: 04/19/1993    Years since quitting: 26.3  . Smokeless tobacco: Never Used  Substance Use Topics  . Alcohol use: No    Alcohol/week: 0.0 standard drinks  . Drug use: No    No current facility-administered medications for this encounter.  Current Outpatient Medications:  .  albuterol (PROAIR HFA) 108 (90 BASE) MCG/ACT inhaler, Inhale 1 puff into the lungs every 6 (six) hours as needed. , Disp: , Rfl:  .  amLODipine (NORVASC) 5 MG tablet, Take 1 tablet (5 mg total) by mouth daily., Disp: 90 tablet, Rfl: 1 .  aspirin 81 MG chewable tablet, Chew 81 mg by mouth daily as needed. , Disp: , Rfl:  .  budesonide-formoterol (SYMBICORT) 160-4.5 MCG/ACT inhaler, Inhale 2 puffs into the lungs 2 (two) times daily., Disp: , Rfl:  .  chlorthalidone (HYGROTON) 25 MG tablet, Take 1 tablet (25 mg total) by mouth daily., Disp: 90 tablet, Rfl: 1 .  Cholecalciferol (KP VITAMIN D3) 25 MCG (1000 UT) capsule, Vitamin D3 25 mcg (1,000 unit) capsule  1 po qd, Disp: , Rfl:  .  fluticasone (FLONASE) 50 MCG/ACT nasal spray, Place 1 spray into both nostrils as needed. , Disp: , Rfl:  .  furosemide (LASIX) 20  MG tablet, Take 1 tablet (20 mg total) by mouth daily., Disp: 30 tablet, Rfl: 3 .  gabapentin (NEURONTIN) 400 MG capsule, TAKE 1 CAPSULE(400 MG) BY MOUTH TWICE DAILY, Disp: 180 capsule, Rfl: 1 .  losartan (COZAAR) 100 MG tablet, Take 1 tablet (100 mg total) by mouth daily., Disp: 90 tablet, Rfl: 1 .  Multiple Vitamins-Minerals (MULTIVITAMIN WOMEN 50+) TABS, Take 1 tablet by mouth daily. , Disp: , Rfl:  .  NEXIUM 20 MG capsule, Take 1 capsule (20 mg total) by mouth daily., Disp: 90 capsule, Rfl: 3 .  potassium chloride (KLOR-CON SPRINKLE) 10 MEQ CR capsule, Take 1 capsule (10 mEq total) by mouth daily. Open capsule, sprinkle over soft food, swallow whole., Disp: 90 capsule, Rfl: 3 .  acetaminophen (TYLENOL) 650 MG CR tablet, Take 650 mg by mouth every 8 (eight) hours as needed for pain.,  Disp: , Rfl:  .  benzonatate (TESSALON) 200 MG capsule, Take 1 capsule (200 mg total) by mouth 3 (three) times daily as needed for cough., Disp: 30 capsule, Rfl: 0 .  meclizine (ANTIVERT) 25 MG tablet, Take 0.5 tablets (12.5 mg total) by mouth every 8 (eight) hours as needed for dizziness., Disp: 40 tablet, Rfl: 3 .  Spacer/Aero-Holding Chambers (AEROCHAMBER PLUS) inhaler, Use as instructed, Disp: 1 each, Rfl: 2  Allergies  Allergen Reactions  . Tramadol Hcl Itching    Per pt it caused me to itch all over  . Statins     Not sure what exact med was; just made her feel bad (can't elaborate)  . Penicillin G Rash  . Penicillins Rash    Has patient had a PCN reaction causing immediate rash, facial/tongue/throat swelling, SOB or lightheadedness with hypotension: No Has patient had a PCN reaction causing severe rash involving mucus membranes or skin necrosis: No Has patient had a PCN reaction that required hospitalization: No Has patient had a PCN reaction occurring within the last 10 years: No If all of the above answers are "NO", then may proceed with Cephalosporin use.  . Sulfa Antibiotics Other (See Comments) and Rash     ROS  As noted in HPI.   Physical Exam  BP 119/74 (BP Location: Left Arm)   Pulse 95   Temp 98 F (36.7 C) (Oral)   Resp 18   Ht 5' 5.5" (1.664 m)   Wt 99.8 kg   SpO2 96%   BMI 36.05 kg/m   Constitutional: Well developed, well nourished, no acute distress Eyes:  EOMI, conjunctiva normal bilaterally HENT: Normocephalic, atraumatic,mucus membranes moist.  Positive nasal congestion.  Normal turbinates.  No sinus tenderness.  Normal oropharynx, tonsils normal without exudates.  Uvula midline.  No obvious postnasal drip. Respiratory: Normal inspiratory effort, lungs clear bilaterally good air movement Cardiovascular: Normal rate regular rhythm no murmurs rubs or gallops GI: nondistended skin: No rash, skin intact Musculoskeletal: no deformities Neurologic:  Alert & oriented x 3, no focal neuro deficits Psychiatric: Speech and behavior appropriate   ED Course   Medications - No data to display  Orders Placed This Encounter  Procedures  . SARS CORONAVIRUS 2 (TAT 6-24 HRS) Nasopharyngeal Nasopharyngeal Swab    Standing Status:   Standing    Number of Occurrences:   1    Order Specific Question:   Is this test for diagnosis or screening    Answer:   Screening    Order Specific Question:   Symptomatic for COVID-19 as defined by CDC    Answer:   Yes  Order Specific Question:   Date of Symptom Onset    Answer:   08/16/2019    Order Specific Question:   Hospitalized for COVID-19    Answer:   No    Order Specific Question:   Admitted to ICU for COVID-19    Answer:   No    Order Specific Question:   Previously tested for COVID-19    Answer:   Yes    Order Specific Question:   Resident in a congregate (group) care setting    Answer:   No    Order Specific Question:   Employed in healthcare setting    Answer:   No    Order Specific Question:   Pregnant    Answer:   No    Order Specific Question:   Has patient completed COVID vaccination(s) (2 doses of Pfizer/Moderna 1 dose of [employer])    Answer:   No  . DG Chest 2 View    Standing Status:   Standing    Number of Occurrences:   1    Order Specific Question:   Reason for Exam (SYMPTOM  OR DIAGNOSIS REQUIRED)    Answer:   cough, SOB r/o pna effusion pulm edema, acute changes    No results found for this or any previous visit (from the past 24 hour(s)). DG Chest 2 View  Result Date: 08/18/2019 CLINICAL DATA:  Cough and shortness of breath, effusion. EXAM: CHEST - 2 VIEW COMPARISON:  10/03/2018 FINDINGS: Lungs are adequately inflated without airspace consolidation or effusion. Cardiomediastinal silhouette and remainder of the exam is unchanged. IMPRESSION: No active cardiopulmonary disease. Electronically Signed   By: Marin Olp M.D.   On: 08/18/2019 10:04    ED Clinical  Impression  1. Viral upper respiratory tract infection   2. Encounter for laboratory testing for COVID-19 virus      ED Assessment/Plan  Reviewed imaging independently. No PNA.  See radiology report for full details.  Lungs are clear, vitals are normal although patient did take aspirin within 4 to 6 hours of evaluation.  She is saturating well on room air.  This appears to be a URI.  Does not appear to be a COPD exacerbation.  With a nasal congestion, rhinorrhea, postnasal drip she has no pneumonia on chest x-ray.  Covid test sent.  She will be a candidate for the infusion clinic.  Will send home with saline nasal irrigation, Tessalon, regularly scheduled albuterol for the next 4 days, send her home with a spacer and have her continue her Flonase.  States that she does not need a prescription for albuterol.  She will follow-up with her primary care physician if not getting any better in about 3 days, to the ER if she gets worse.  Discussed labs, imaging, MDM, treatment plan, and plan for follow-up with patient and daughter. Discussed sn/sx that should prompt return to the ED. they agree with plan  Meds ordered this encounter  Medications  . benzonatate (TESSALON) 200 MG capsule    Sig: Take 1 capsule (200 mg total) by mouth 3 (three) times daily as needed for cough.    Dispense:  30 capsule    Refill:  0  . Spacer/Aero-Holding Chambers (AEROCHAMBER PLUS) inhaler    Sig: Use as instructed    Dispense:  1 each    Refill:  2    *This clinic note was created using Dragon dictation software. Therefore, there may be occasional mistakes despite careful proofreading.   ?  Melynda Ripple, MD 08/18/19 1558

## 2019-08-18 NOTE — Discharge Instructions (Addendum)
Continue Flonase.  Start some saline nasal irrigation with a Milta Deiters med rinse and distilled water as often as you want.  This will help you breathe, prevent sinus infections, and stop the postnasal drip which I think is contributing to your sore throat.  Continue Mucinex.  No antihistamine such as Claritin, Zyrtec or Allegra.  Do 2 puffs from your albuterol inhaler using your spacer every 4 hours for 2 days, then every 6 hours for 2 days, then you may use it as needed.  This will help keep your lungs open and make it easier for you to breathe.  Tessalon for the cough.  We will contact you if your Covid test comes back positive.  You will be a candidate for the infusion clinic.  Please get your Covid vaccine as soon as you can once you are feeling better.

## 2019-08-18 NOTE — ED Triage Notes (Signed)
Patient c/o cough, chest congestion, and runny nose, and SOB that started Friday night. Patient denies fevers.

## 2019-08-19 LAB — SARS CORONAVIRUS 2 (TAT 6-24 HRS): SARS Coronavirus 2: NEGATIVE

## 2019-08-26 ENCOUNTER — Ambulatory Visit (INDEPENDENT_AMBULATORY_CARE_PROVIDER_SITE_OTHER): Payer: Medicare Other | Admitting: Pharmacist

## 2019-08-26 DIAGNOSIS — J449 Chronic obstructive pulmonary disease, unspecified: Secondary | ICD-10-CM

## 2019-08-26 DIAGNOSIS — N182 Chronic kidney disease, stage 2 (mild): Secondary | ICD-10-CM | POA: Diagnosis not present

## 2019-08-26 DIAGNOSIS — I129 Hypertensive chronic kidney disease with stage 1 through stage 4 chronic kidney disease, or unspecified chronic kidney disease: Secondary | ICD-10-CM

## 2019-08-26 NOTE — Chronic Care Management (AMB) (Signed)
Chronic Care Management   Follow Up Note   08/26/2019 Name: Candace Tapia MRN: [id number] DOB: 01-18-1936  Referred by: Olin Hauser, DO Reason for referral : Chronic Care Management (Patient Phone Call)   Candace FIDANZA is a 84 y.o. year old female who is a primary care patient of Olin Hauser, DO. The CCM team was consulted for assistance with chronic disease management and care coordination needs.    I reached out to [name] by phone today.   Review of patient status, including review of consultants reports, relevant laboratory and other test results, and collaboration with appropriate care team members and the patient's provider was performed as part of comprehensive patient evaluation and provision of chronic care management services.     Outpatient Encounter Medications as of 08/26/2019  Medication Sig Note  . albuterol (PROAIR HFA) 108 (90 BASE) MCG/ACT inhaler Inhale 1 puff into the lungs every 6 (six) hours as needed.    . benzonatate (TESSALON) 200 MG capsule Take 1 capsule (200 mg total) by mouth 3 (three) times daily as needed for cough.   Marland Kitchen acetaminophen (TYLENOL) 650 MG CR tablet Take 650 mg by mouth every 8 (eight) hours as needed for pain.   Marland Kitchen amLODipine (NORVASC) 5 MG tablet Take 1 tablet (5 mg total) by mouth daily.   Marland Kitchen aspirin 81 MG chewable tablet Chew 81 mg by mouth daily as needed.  01/01/2019: Takes every once in a while  . budesonide-formoterol (SYMBICORT) 160-4.5 MCG/ACT inhaler Inhale 2 puffs into the lungs 2 (two) times daily.   . chlorthalidone (HYGROTON) 25 MG tablet Take 1 tablet (25 mg total) by mouth daily.   . Cholecalciferol (KP VITAMIN D3) 25 MCG (1000 UT) capsule Vitamin D3 25 mcg (1,000 unit) capsule  1 po qd   . fluticasone (FLONASE) 50 MCG/ACT nasal spray Place 1 spray into both nostrils as needed.    . furosemide (LASIX) 20 MG tablet Take 1 tablet (20 mg total) by mouth daily.   Marland Kitchen gabapentin (NEURONTIN) 400  MG capsule TAKE 1 CAPSULE(400 MG) BY MOUTH TWICE DAILY   . losartan (COZAAR) 100 MG tablet Take 1 tablet (100 mg total) by mouth daily.   . meclizine (ANTIVERT) 25 MG tablet Take 0.5 tablets (12.5 mg total) by mouth every 8 (eight) hours as needed for dizziness.   . Multiple Vitamins-Minerals (MULTIVITAMIN WOMEN 50+) TABS Take 1 tablet by mouth daily.    Marland Kitchen NEXIUM 20 MG capsule Take 1 capsule (20 mg total) by mouth daily.   . potassium chloride (KLOR-CON SPRINKLE) 10 MEQ CR capsule Take 1 capsule (10 mEq total) by mouth daily. Open capsule, sprinkle over soft food, swallow whole.   Marland Kitchen Spacer/Aero-Holding Chambers (AEROCHAMBER PLUS) inhaler Use as instructed   . [DISCONTINUED] fexofenadine (ALLEGRA ALLERGY) 180 MG tablet Take 180 mg by mouth as needed. Reported on 09/29/2015    No facility-administered encounter medications on file as of 08/26/2019.    Goals Addressed            This Visit's Progress   . PharmD - Medication review       CARE PLAN ENTRY (see longitudinal plan of care for additional care plan information)  Current Barriers:  Marland Kitchen Knowledge deficits related to basic COPD self care/management . Knowledge deficit related to basic understanding of how to use inhalers and how inhaled medications work   . Limited Social Support  Pharmacist Clinical Goal(s):  Marland Kitchen Over the next 30 days, patient  will work with CM Pharmacist to address needs related to medication adherence and medication regimen optimization  Interventions: . Perform chart review. Note patient seen in Urgent Care on 5/2 for upper respiratory tract infection.  . Follow up with patient regarding recent illness. Reports breathing is improved, but still tired. ? Again counsel on importance of using maintenance inhaler (Symbicort) twice daily and rescue (albuerol) inhaler as needed as directed and rinsing out mouth after use of Symbicort. ? Confirms using benzonatate capsules as needed as directed for cough ? Patient  confirms received spacer and mask from Urgent Care. Counsel patient on benefit of device and encourage to use with inhalers . Encourage patient to follow up with provider if breathing/symptoms worsen or do not continue to improve . Follow up regarding BP control and monitoring ? Denies having checked while sick. Encourage patient to restart checking BP and keep log ? Reports continues to drink [place] soda throughout the day. Encourage patient to drink water more in place of [facility]  ? Remind patient to follow up with PCP office if BP remains persistently >140/90 or new symptoms occur, as directed by PCP  Patient Self Care Activities:  . Attends all scheduled provider appointments . Calls pharmacy for medication refills . Calls provider office for new concerns or questions  Please see past updates related to this goal by clicking on the "Past Updates" button in the selected goal         Plan  The care management team will reach out to the patient again over the next 30 days.   Harlow Asa, PharmD, [place] [employer] [phone]

## 2019-08-26 NOTE — Patient Instructions (Signed)
Thank you allowing the Chronic Care Management Team to be a part of your care! It was a pleasure speaking with you today!     CCM (Chronic Care Management) Team    Noreene Larsson RN, MSN, CCM Nurse Care Coordinator  [phone]   Harlow Asa PharmD  Clinical Pharmacist  [phone]   [name] LCSW Clinical Social Worker [phone]  Visit Information  Goals Addressed            This Visit's Progress   . PharmD - Medication review       CARE PLAN ENTRY (see longitudinal plan of care for additional care plan information)  Current Barriers:  Marland Kitchen Knowledge deficits related to basic COPD self care/management . Knowledge deficit related to basic understanding of how to use inhalers and how inhaled medications work   . Limited Social Support  Pharmacist Clinical Goal(s):  Marland Kitchen Over the next 30 days, patient will work with CM Pharmacist to address needs related to medication adherence and medication regimen optimization  Interventions: . Perform chart review. Note patient seen in Urgent Care on 5/2 for upper respiratory tract infection.  . Follow up with patient regarding recent illness. Reports breathing is improved, but still tired. ? Again counsel on importance of using maintenance inhaler (Symbicort) twice daily and rescue (albuerol) inhaler as needed as directed and rinsing out mouth after use of Symbicort. ? Confirms using benzonatate capsules as needed as directed for cough ? Patient confirms received spacer and mask from Urgent Care. Counsel patient on benefit of device and encourage to use with inhalers . Encourage patient to follow up with provider if breathing/symptoms worsen or do not continue to improve . Follow up regarding BP control and monitoring ? Denies having checked while sick. Encourage patient to restart checking BP and keep log ? Reports continues to drink [place] Dew soda throughout the day. Encourage patient to drink water more in place of  [facility]  ? Remind patient to follow up with PCP office if BP remains persistently >140/90 or new symptoms occur, as directed by PCP  Patient Self Care Activities:  . Attends all scheduled provider appointments . Calls pharmacy for medication refills . Calls provider office for new concerns or questions  Please see past updates related to this goal by clicking on the "Past Updates" button in the selected goal         Patient verbalizes understanding of instructions provided today.   The care management team will reach out to the patient again over the next 30 days.   Harlow Asa, PharmD, [place] [employer] [phone]

## 2019-09-12 ENCOUNTER — Ambulatory Visit: Payer: Medicare Other

## 2019-09-12 NOTE — Chronic Care Management (AMB) (Signed)
  Care Management   Follow Up Note   09/12/2019 Name: Candace Tapia MRN: [id number] DOB: Jan 26, 1936  Referred by: Olin Hauser, DO Reason for referral : [place] is a 84 y.o. year old female who is a primary care patient of Olin Hauser, DO. The care management team was consulted for assistance with care management and care coordination needs.    Review of patient status, including review of consultants reports, relevant laboratory and other test results, and collaboration with appropriate care team members and the patient's provider was performed as part of comprehensive patient evaluation and provision of chronic care management services.    LCSW completed CCM outreach attempt today but was unable to reach patient successfully. A HIPPA compliant voice message was left encouraging patient to return call once available. LCSW rescheduled CCM SW appointment as well.  A HIPPA compliant phone message was left for the patient providing contact information and requesting a return call.   [name], BSW, MSW, [email or web address] Phone: [phone]

## 2019-09-13 ENCOUNTER — Telehealth: Payer: Self-pay | Admitting: Family Medicine

## 2019-09-13 ENCOUNTER — Encounter: Payer: Self-pay | Admitting: Family Medicine

## 2019-09-13 NOTE — Telephone Encounter (Signed)
   SF 09/13/2019   Name: Candace Tapia   MRN: [id number]   DOB: 1935/12/22   AGE: 84 y.o.   GENDER: female   PCP Olin Hauser, DO.   Spoke with patient today regarding referral for assistant living or adult day care programs. Patient stated that at this time she feels she no longer needs the resources for assistant living or adult day care programs. Instead patient stated she needs assistance with transportation because currently she has to depend on her daughter to take her to her appointments and can only schedule appointments for days when her daughter is not working. Gave the patient the number for  [employer] program in [place] [phone]). Also informed patient that because she has [employer] she may be eligible for their transportation benefit. Patient stated that she would rather use [facility] program instead. [name] stated she will give them a call, but asked if a letter can be mailed with the information so she can have it as a reference. Patient stated no additional needs at this time.   Closing referral pending any other needs of patient.      [place], Care Management Phone: [phone] Email: [email or web address]

## 2019-09-13 NOTE — Telephone Encounter (Signed)
Sent email to [facility] in the office to print and mail letter for Ms. Blaclock.   [email or web address]> Secure Referral for Ms. Constancio  Hi Tiffany,   When you get a chance can you please print and mail the letter attached for Ms. Eoff?  Thanks,   [place], Care Management Phone: [phone] Email: [email or web address]

## 2019-09-25 ENCOUNTER — Ambulatory Visit (INDEPENDENT_AMBULATORY_CARE_PROVIDER_SITE_OTHER): Payer: Medicare Other | Admitting: Pharmacist

## 2019-09-25 DIAGNOSIS — I129 Hypertensive chronic kidney disease with stage 1 through stage 4 chronic kidney disease, or unspecified chronic kidney disease: Secondary | ICD-10-CM

## 2019-09-25 DIAGNOSIS — J449 Chronic obstructive pulmonary disease, unspecified: Secondary | ICD-10-CM

## 2019-09-25 NOTE — Chronic Care Management (AMB) (Signed)
Chronic Care Management   Follow Up Note   09/25/2019 Name: Candace Tapia MRN: [id number] DOB: 10/12/1935  Referred by: Olin Hauser, DO Reason for referral : Chronic Care Management (Patient Phone Call)   Candace Tapia is a 84 y.o. year old female who is a primary care patient of Olin Hauser, DO. The CCM team was consulted for assistance with chronic disease management and care coordination needs.    I reached out to Candace Tapia by phone today.   Review of patient status, including review of consultants reports, relevant laboratory and other test results, and collaboration with appropriate care team members and the patient's provider was performed as part of comprehensive patient evaluation and provision of chronic care management services.     Outpatient Encounter Medications as of 09/25/2019  Medication Sig Note  . amLODipine (NORVASC) 5 MG tablet Take 1 tablet (5 mg total) by mouth daily.   . budesonide-formoterol (SYMBICORT) 160-4.5 MCG/ACT inhaler Inhale 2 puffs into the lungs 2 (two) times daily.   . chlorthalidone (HYGROTON) 25 MG tablet Take 1 tablet (25 mg total) by mouth daily.   . Cholecalciferol (KP VITAMIN D3) 25 MCG (1000 UT) capsule Vitamin D3 25 mcg (1,000 unit) capsule  1 po qd   . losartan (COZAAR) 100 MG tablet Take 1 tablet (100 mg total) by mouth daily.   . potassium chloride (KLOR-CON SPRINKLE) 10 MEQ CR capsule Take 1 capsule (10 mEq total) by mouth daily. Open capsule, sprinkle over soft food, swallow whole.   Marland Kitchen acetaminophen (TYLENOL) 650 MG CR tablet Take 650 mg by mouth every 8 (eight) hours as needed for pain.   Marland Kitchen albuterol (PROAIR HFA) 108 (90 BASE) MCG/ACT inhaler Inhale 1 puff into the lungs every 6 (six) hours as needed.    Marland Kitchen aspirin 81 MG chewable tablet Chew 81 mg by mouth daily as needed.  01/01/2019: Takes every once in a while  . fluticasone (FLONASE) 50 MCG/ACT nasal spray Place 1 spray into both nostrils as  needed.    . gabapentin (NEURONTIN) 400 MG capsule TAKE 1 CAPSULE(400 MG) BY MOUTH TWICE DAILY   . meclizine (ANTIVERT) 25 MG tablet Take 0.5 tablets (12.5 mg total) by mouth every 8 (eight) hours as needed for dizziness.   . Multiple Vitamins-Minerals (MULTIVITAMIN WOMEN 50+) TABS Take 1 tablet by mouth daily.    Marland Kitchen NEXIUM 20 MG capsule Take 1 capsule (20 mg total) by mouth daily.   Marland Kitchen Spacer/Aero-Holding Chambers (AEROCHAMBER PLUS) inhaler Use as instructed   . [DISCONTINUED] benzonatate (TESSALON) 200 MG capsule Take 1 capsule (200 mg total) by mouth 3 (three) times daily as needed for cough. (Patient not taking: Reported on 09/25/2019)   . [DISCONTINUED] fexofenadine (ALLEGRA ALLERGY) 180 MG tablet Take 180 mg by mouth as needed. Reported on 09/29/2015   . [DISCONTINUED] furosemide (LASIX) 20 MG tablet Take 1 tablet (20 mg total) by mouth daily. (Patient not taking: Reported on 09/25/2019)    No facility-administered encounter medications on file as of 09/25/2019.    Goals Addressed            This Visit's Progress   . PharmD - Medication review       CARE PLAN ENTRY (see longitudinal plan of care for additional care plan information)  Current Barriers:  Marland Kitchen Knowledge deficits related to basic COPD self care/management . Knowledge deficit related to basic understanding of how to use inhalers and how inhaled medications work   . Limited  Social Support  Pharmacist Clinical Goal(s):  Marland Kitchen Over the next 30 days, patient will work with CM Pharmacist to address needs related to medication adherence and medication regimen optimization  Interventions: . Perform chart review. Note Care Guide Audelia Hives reached out to patient on 5/28 regarding assisted living and adult day care programs, but declined need for this resource, but instead requested transportation resource  . Follow up regarding BP control and monitoring ? Reports taking:  Amlodipine 5 mg once daily  Losartan 100 mg once  daily  Chlorthalidone 25 mg once daily ? Denies having checked BP recently. Encourage patient to restart checking BP and keep log ? Reports continues to drink [place] Dew soda throughout the day. Encourage patient to drink water more in place of [facility]  ? Remind patient to follow up with PCP office if BP remains persistently >140/90 or new symptoms occur, as directed by PCP . Counsel patient on importance of medication adherence ? Again counsel on importance of using maintenance inhaler (Symbicort) and rescue (albuerol) inhalers as directed and rinsing out mouth after use of Symbicort ? Confirms continuing to use weekly pillbox ? Identify patient needing refill of potassium chloride. Patient confirms will call pharmacy . Follow up with patient regarding previous interest in assisted living/adult day programs. Reports has been thinking about this again since spoke with [facility] and is now interested. ? Encourage patient to follow up with Care Guide again for these resources. Patient confirms having phone number from letter that she received from [place] and states that she will call today. Marland Kitchen Counsel patient to follow up with provider office for new or worsening medical concerns  Patient Self Care Activities:  . Attends all scheduled provider appointments o Next PCP appointment on 6/30 . Calls pharmacy for medication refills . Calls provider office for new concerns or questions  Please see past updates related to this goal by clicking on the "Past Updates" button in the selected goal         Plan  The care management team will reach out to the patient again over the next 30 days.   Harlow Asa, PharmD, [place] [employer] [phone]

## 2019-09-25 NOTE — Patient Instructions (Signed)
Thank you allowing the Chronic Care Management Team to be a part of your care! It was a pleasure speaking with you today!     CCM (Chronic Care Management) Team    Noreene Larsson RN, MSN, CCM Nurse Care Coordinator  [phone]   Harlow Asa PharmD  Clinical Pharmacist  [phone]   [name] LCSW Clinical Social Worker [phone]  Visit Information  Goals Addressed            This Visit's Progress    PharmD - Medication review       CARE PLAN ENTRY (see longitudinal plan of care for additional care plan information)  Current Barriers:   Knowledge deficits related to basic COPD self care/management  Knowledge deficit related to basic understanding of how to use inhalers and how inhaled medications work    Limited Social Support  Pharmacist Clinical Goal(s):   Over the next 30 days, patient will work with [employer] Pharmacist to address needs related to medication adherence and medication regimen optimization  Interventions:  Perform chart review. Note Care Guide Audelia Hives reached out to patient on 5/28 regarding assisted living and adult day care programs, but declined need for this resource, but instead requested transportation resource   Follow up regarding BP control and monitoring ? Reports taking:  Amlodipine 5 mg once daily  Losartan 100 mg once daily  Chlorthalidone 25 mg once daily ? Denies having checked BP recently. Encourage patient to restart checking BP and keep log ? Reports continues to drink [place] Dew soda throughout the day. Encourage patient to drink water more in place of [facility]  ? Remind patient to follow up with PCP office if BP remains persistently >140/90 or new symptoms occur, as directed by PCP  Counsel patient on importance of medication adherence ? Again counsel on importance of using maintenance inhaler (Symbicort) and rescue (albuerol) inhalers as directed and rinsing out mouth after use of Symbicort ? Confirms  continuing to use weekly pillbox ? Identify patient needing refill of potassium chloride. Patient confirms will call pharmacy  Follow up with patient regarding previous interest in assisted living/adult day programs. Reports has been thinking about this again since spoke with [facility] and is now interested. ? Encourage patient to follow up with Care Guide again for these resources. Patient confirms having phone number from letter that she received from [place] and states that she will call today.  Counsel patient to follow up with provider office for new or worsening medical concerns  Patient Self Care Activities:   Attends all scheduled provider appointments o Next PCP appointment on 6/30  Calls pharmacy for medication refills  Calls provider office for new concerns or questions  Please see past updates related to this goal by clicking on the "Past Updates" button in the selected goal         Patient verbalizes understanding of instructions provided today.   The care management team will reach out to the patient again over the next 30 days.   Harlow Asa, PharmD, [place] [employer] [phone]

## 2019-10-03 ENCOUNTER — Telehealth: Payer: Medicare Other | Admitting: General Practice

## 2019-10-03 ENCOUNTER — Ambulatory Visit: Payer: Self-pay | Admitting: General Practice

## 2019-10-03 DIAGNOSIS — E782 Mixed hyperlipidemia: Secondary | ICD-10-CM

## 2019-10-03 DIAGNOSIS — N182 Chronic kidney disease, stage 2 (mild): Secondary | ICD-10-CM

## 2019-10-03 DIAGNOSIS — M160 Bilateral primary osteoarthritis of hip: Secondary | ICD-10-CM

## 2019-10-03 DIAGNOSIS — I129 Hypertensive chronic kidney disease with stage 1 through stage 4 chronic kidney disease, or unspecified chronic kidney disease: Secondary | ICD-10-CM

## 2019-10-03 DIAGNOSIS — J4489 Other specified chronic obstructive pulmonary disease: Secondary | ICD-10-CM

## 2019-10-03 DIAGNOSIS — J449 Chronic obstructive pulmonary disease, unspecified: Secondary | ICD-10-CM

## 2019-10-03 DIAGNOSIS — F3342 Major depressive disorder, recurrent, in full remission: Secondary | ICD-10-CM

## 2019-10-03 DIAGNOSIS — G894 Chronic pain syndrome: Secondary | ICD-10-CM

## 2019-10-03 DIAGNOSIS — F419 Anxiety disorder, unspecified: Secondary | ICD-10-CM

## 2019-10-03 DIAGNOSIS — M25559 Pain in unspecified hip: Secondary | ICD-10-CM

## 2019-10-03 DIAGNOSIS — M545 Low back pain, unspecified: Secondary | ICD-10-CM

## 2019-10-03 DIAGNOSIS — G8929 Other chronic pain: Secondary | ICD-10-CM

## 2019-10-03 DIAGNOSIS — R7303 Prediabetes: Secondary | ICD-10-CM

## 2019-10-03 NOTE — Patient Instructions (Signed)
Visit Information  Goals Addressed              This Visit's Progress     RNCM: I do the best I can and get by        Current Barriers:   Chronic Disease Management support, education, and care coordination needs related to HTN, COPD, Anxiety, and Depression  Social Isolation/Limited support network  Limited Mobility  Clinical Goal(s) related to HTN, COPD, Anxiety, and Depression:  Over the next  120 days, patient will:   Work with the care management team to address educational, disease management, and care coordination needs   Begin or continue self health monitoring activities as directed today Measure and record blood pressure 4 times per week  Call provider office for new or worsened signs and symptoms Blood pressure findings outside established parameters, Oxygen saturation lower than established parameter, Shortness of breath, and New or worsened symptom related to Depression and anxiety  Call care management team with questions or concerns  Verbalize basic understanding of patient centered plan of care established today  Interventions related to HTN, COPD, Anxiety, and Depression:   Evaluation of current treatment plans and patient's adherence to plan as established by provider.  The patient verbalized compliance with her plan of care. The patient does not take statins, review of notes reveal she can not tolerate and does not want to try a lower dose.  The patient does take ASA.  Assessed patient understanding of disease states.  The patient verbalized understanding of her chronic conditions; however states she is "giddy" and forgetful.  Her biggest issue is social isolation and being by herself a lot. 10-03-2019: Discussed ways to increase social activity. The patient is a "natural loner", but she wants to have companionship now. The patient states she sees her sons about an hour a week. She use to talk to her brothers and sisters on the phone but she is the only living  sibling left. The patient says she does not qualify for Medicaid services but she will check with [facility] insurance about her benefits. She may have to consider moving to a LTC facility or assisted living. She says she has good days and bad days and today is a good day.   Assessed patient's education and care coordination needs.  The patient verbalized sometimes she thinks about going to an assisted living but she is unsure. Is receptive to talking to the care guide about assisted living places in her area. Will place a care guide referral for assistance. 10-03-2019: The patient has talked to the care guides and the LCSW about facilities. The patient is not sure what to do but feels in the future this is something she will have to do.   Provided disease specific education to patient.  Education on following a Heart Healthy/low carb diet.  The patient states she tries to watch her sodium content and fats and needs to lose some weight. She takes a fluid pill, denies any fluid in her feet and legs.  10-03-2019: The patient endorses a heart healthy diet.  Discussed watching sugar content as well. The patient is prediabetic. The patient states bother of her parents were diabetic and some of her siblings were. The patient states that she has to be careful with sweets because once they are in her house she likes to eat all of it at one time. Denies any issues with dietary restrictions at this time.   Evaluation of depression specifically. The patient has a  long standing history of this. She states medications make it worse.  Her one friend who she use to do things with is declining in health and memory problems therefore she does not have that anymore. She has not hobbies she likes to do. Talked to the patient about coping skills and possibly having outings with her daughter in law. She states her daughter in law works and this is not always possible.  Discussed audio books but the patient was unsure of this. 10-03-2019:  The patient is open to ideas to help with her depression. Hobbies that use to interest her do not now. The patient is open to ideas and admits she is curious about things. She will let the RNCM know ideas to help her with her depression. Also working with LCSW.   Collaborated with appropriate clinical care team members regarding patient needs.  Patient is currently involved with LCSW and pharmacist.   Evaluation of upcoming appointments: PCP appointment on 10-16-2019  Patient Self Care Activities related to HTN, COPD, Anxiety, and Depression:   Patient is unable to independently self-manage chronic health conditions  Please see past updates related to this goal by clicking on the "Past Updates" button in the selected goal        RNCM: pt-"I am having a time some days with my arthritis pain" (pt-stated)        CARE PLAN ENTRY (see longitudinal plan of care for additional care plan information)  Current Barriers:   Knowledge Deficits related to chronic osteoarthritis pain  Chronic Disease Management support and education needs related to chronic osteoarthritis pain  Lacks caregiver support.   Nurse Case Manager Clinical Goal(s):   Over the next 120 days, patient will verbalize understanding of plan for pain management for chronic osteoarthritis   Over the next 120 days, patient will work with [facility], CCM team and pcp to address needs related to osteoarthritis  Over the next 120 days, patient will work with CM team pharmacist to assist with medications management and pain medication recommendations  Interventions:   Inter-disciplinary care team collaboration (see longitudinal plan of care)  Evaluation of current treatment plan related to pain management  and patient's adherence to plan as established by provider.  Advised patient to notify the provider of changes in level or intensity of pain  Provided education to patient re: alternative measures to help with pain management. The  patient currently uses tylenol arthritis for pain relief.   Reviewed medications with patient and discussed compliance. The patient has been working with the pharmacist with compliance and medication management   Collaborated with CCM team and pcp regarding pain management and patient needs  Discussed plans with patient for ongoing care management follow up and provided patient with direct contact information for care management team  Reviewed scheduled/upcoming provider appointments including: Appointment with pcp on 10-16-2019  Patient Self Care Activities:   Patient verbalizes understanding of plan to talk to pcp about changes in her pain and recommendations for pain management   Attends all scheduled provider appointments  Calls provider office for new concerns or questions  Unable to independently manage chronic pain from osteoarthritis   Initial goal documentation        Patient verbalizes understanding of instructions provided today.   The care management team will reach out to the patient again over the next 30 to 60 days.   Noreene Larsson RN, MSN, [facility]  Mobile: [phone]

## 2019-10-03 NOTE — Chronic Care Management (AMB) (Signed)
Chronic Care Management   Follow Up Note   10/03/2019 Name: Candace Tapia MRN: [id number] DOB: 05-03-1935  Referred by: Olin Hauser, DO Reason for referral : Chronic Care Management (RNCM: Chronic Disease Management and Care Coordination Needs)   Candace Tapia is a 84 y.o. year old female who is a primary care patient of Olin Hauser, DO. The CCM team was consulted for assistance with chronic disease management and care coordination needs.    Review of patient status, including review of consultants reports, relevant laboratory and other test results, and collaboration with appropriate care team members and the patient's provider was performed as part of comprehensive patient evaluation and provision of chronic care management services.    SDOH (Social Determinants of Health) assessments performed: Yes- living arrangements and transportation See Care Plan activities for detailed interventions related to [facility])     Outpatient Encounter Medications as of 10/03/2019  Medication Sig Note  . acetaminophen (TYLENOL) 650 MG CR tablet Take 650 mg by mouth every 8 (eight) hours as needed for pain.   Marland Kitchen albuterol (PROAIR HFA) 108 (90 BASE) MCG/ACT inhaler Inhale 1 puff into the lungs every 6 (six) hours as needed.    Marland Kitchen amLODipine (NORVASC) 5 MG tablet Take 1 tablet (5 mg total) by mouth daily.   Marland Kitchen aspirin 81 MG chewable tablet Chew 81 mg by mouth daily as needed.  01/01/2019: Takes every once in a while  . budesonide-formoterol (SYMBICORT) 160-4.5 MCG/ACT inhaler Inhale 2 puffs into the lungs 2 (two) times daily.   . chlorthalidone (HYGROTON) 25 MG tablet Take 1 tablet (25 mg total) by mouth daily.   . Cholecalciferol (KP VITAMIN D3) 25 MCG (1000 UT) capsule Vitamin D3 25 mcg (1,000 unit) capsule  1 po qd   . fluticasone (FLONASE) 50 MCG/ACT nasal spray Place 1 spray into both nostrils as needed.    . gabapentin (NEURONTIN) 400 MG capsule TAKE 1 CAPSULE(400 MG) BY  MOUTH TWICE DAILY   . losartan (COZAAR) 100 MG tablet Take 1 tablet (100 mg total) by mouth daily.   . meclizine (ANTIVERT) 25 MG tablet Take 0.5 tablets (12.5 mg total) by mouth every 8 (eight) hours as needed for dizziness.   . Multiple Vitamins-Minerals (MULTIVITAMIN WOMEN 50+) TABS Take 1 tablet by mouth daily.    Marland Kitchen NEXIUM 20 MG capsule Take 1 capsule (20 mg total) by mouth daily.   . potassium chloride (KLOR-CON SPRINKLE) 10 MEQ CR capsule Take 1 capsule (10 mEq total) by mouth daily. Open capsule, sprinkle over soft food, swallow whole.   Marland Kitchen Spacer/Aero-Holding Chambers (AEROCHAMBER PLUS) inhaler Use as instructed   . [DISCONTINUED] fexofenadine (ALLEGRA ALLERGY) 180 MG tablet Take 180 mg by mouth as needed. Reported on 09/29/2015    No facility-administered encounter medications on file as of 10/03/2019.     Objective:  BP Readings from Last 3 Encounters:  10/02/19 137/70  08/18/19 119/74  07/10/19 103/63    Goals Addressed              This Visit's Progress   .  RNCM: I do the best I can and get by        Current Barriers:  . Chronic Disease Management support, education, and care coordination needs related to HTN, COPD, Anxiety, and Depression . Social Isolation/Limited support network . Limited Mobility  Clinical Goal(s) related to HTN, COPD, Anxiety, and Depression:  Over the next  120 days, patient will:  . Work with the care  management team to address educational, disease management, and care coordination needs  . Begin or continue self health monitoring activities as directed today Measure and record blood pressure 4 times per week . Call provider office for new or worsened signs and symptoms Blood pressure findings outside established parameters, Oxygen saturation lower than established parameter, Shortness of breath, and New or worsened symptom related to Depression and anxiety . Call care management team with questions or concerns . Verbalize basic understanding of  patient centered plan of care established today  Interventions related to HTN, COPD, Anxiety, and Depression:  . Evaluation of current treatment plans and patient's adherence to plan as established by provider.  The patient verbalized compliance with her plan of care. The patient does not take statins, review of notes reveal she can not tolerate and does not want to try a lower dose.  The patient does take ASA. Marland Kitchen Assessed patient understanding of disease states.  The patient verbalized understanding of her chronic conditions; however states she is "giddy" and forgetful.  Her biggest issue is social isolation and being by herself a lot. 10-03-2019: Discussed ways to increase social activity. The patient is a "natural loner", but she wants to have companionship now. The patient states she sees her sons about an hour a week. She use to talk to her brothers and sisters on the phone but she is the only living sibling left. The patient says she does not qualify for Medicaid services but she will check with [facility] insurance about her benefits. She may have to consider moving to a LTC facility or assisted living. She says she has good days and bad days and today is a good day.  . Assessed patient's education and care coordination needs.  The patient verbalized sometimes she thinks about going to an assisted living but she is unsure. Is receptive to talking to the care guide about assisted living places in her area. Will place a care guide referral for assistance. 10-03-2019: The patient has talked to the care guides and the LCSW about facilities. The patient is not sure what to do but feels in the future this is something she will have to do.  . Provided disease specific education to patient.  Education on following a Heart Healthy/low carb diet.  The patient states she tries to watch her sodium content and fats and needs to lose some weight. She takes a fluid pill, denies any fluid in her feet and legs.  10-03-2019: The  patient endorses a heart healthy diet.  Discussed watching sugar content as well. The patient is prediabetic. The patient states bother of her parents were diabetic and some of her siblings were. The patient states that she has to be careful with sweets because once they are in her house she likes to eat all of it at one time. Denies any issues with dietary restrictions at this time.  . Evaluation of depression specifically. The patient has a long standing history of this. She states medications make it worse.  Her one friend who she use to do things with is declining in health and memory problems therefore she does not have that anymore. She has not hobbies she likes to do. Talked to the patient about coping skills and possibly having outings with her daughter in law. She states her daughter in law works and this is not always possible.  Discussed audio books but the patient was unsure of this. 10-03-2019: The patient is open to ideas to help with  her depression. Hobbies that use to interest her do not now. The patient is open to ideas and admits she is curious about things. She will let the RNCM know ideas to help her with her depression. Also working with LCSW.  Nash Dimmer with appropriate clinical care team members regarding patient needs.  Patient is currently involved with LCSW and pharmacist.  . Evaluation of upcoming appointments: PCP appointment on 10-16-2019  Patient Self Care Activities related to HTN, COPD, Anxiety, and Depression:  . Patient is unable to independently self-manage chronic health conditions  Please see past updates related to this goal by clicking on the "Past Updates" button in the selected goal      .  RNCM: pt-"I am having a time some days with my arthritis pain" (pt-stated)        CARE PLAN ENTRY (see longitudinal plan of care for additional care plan information)  Current Barriers:  Marland Kitchen Knowledge Deficits related to chronic osteoarthritis pain . Chronic Disease  Management support and education needs related to chronic osteoarthritis pain . Lacks caregiver support.   Nurse Case Manager Clinical Goal(s):  Marland Kitchen Over the next 120 days, patient will verbalize understanding of plan for pain management for chronic osteoarthritis  . Over the next 120 days, patient will work with [facility], [place] team and pcp to address needs related to osteoarthritis . Over the next 120 days, patient will work with CM team pharmacist to assist with medications management and pain medication recommendations  Interventions:  . Inter-disciplinary care team collaboration (see longitudinal plan of care) . Evaluation of current treatment plan related to pain management  and patient's adherence to plan as established by provider. . Advised patient to notify the provider of changes in level or intensity of pain . Provided education to patient re: alternative measures to help with pain management. The patient currently uses tylenol arthritis for pain relief.  . Reviewed medications with patient and discussed compliance. The patient has been working with the pharmacist with compliance and medication management  . Collaborated with CCM team and pcp regarding pain management and patient needs . Discussed plans with patient for ongoing care management follow up and provided patient with direct contact information for care management team . Reviewed scheduled/upcoming provider appointments including: Appointment with pcp on 10-16-2019  Patient Self Care Activities:  . Patient verbalizes understanding of plan to talk to pcp about changes in her pain and recommendations for pain management  . Attends all scheduled provider appointments . Calls provider office for new concerns or questions . Unable to independently manage chronic pain from osteoarthritis   Initial goal documentation         Plan:   The care management team will reach out to the patient again over the next 30 to 60 days.      Noreene Larsson RN, MSN, [place] Mobile: [phone]

## 2019-10-04 ENCOUNTER — Ambulatory Visit: Payer: Self-pay | Admitting: *Deleted

## 2019-10-04 DIAGNOSIS — G894 Chronic pain syndrome: Secondary | ICD-10-CM

## 2019-10-04 DIAGNOSIS — G8929 Other chronic pain: Secondary | ICD-10-CM

## 2019-10-04 DIAGNOSIS — F3342 Major depressive disorder, recurrent, in full remission: Secondary | ICD-10-CM

## 2019-10-04 DIAGNOSIS — J449 Chronic obstructive pulmonary disease, unspecified: Secondary | ICD-10-CM

## 2019-10-04 NOTE — Chronic Care Management (AMB) (Signed)
  Chronic Care Management   Note  10/04/2019 Name: Candace Tapia MRN: [id number] DOB: 1935-06-19  [name] LCSW has been working with [name] since March regarding level of care needs. During recent visits with the provider team, [name] expressed additional questions related to level of care concerns. [name] has a scheduled visit with [name] on 10/08/19.   Follow up plan: Telephone follow up appointment with care management team member scheduled for: 10/08/19.   [place] Coordination Supervisor Direct Dial:  [phone]  Fax: [phone]

## 2019-10-08 ENCOUNTER — Ambulatory Visit: Payer: Medicare Other

## 2019-10-08 NOTE — Chronic Care Management (AMB) (Signed)
  Care Management   Follow Up Note   10/08/2019 Name: Candace Tapia MRN: [id number] DOB: 1936-02-24  Referred by: Olin Hauser, DO Reason for referral : Odin is a 84 y.o. year old female who is a primary care patient of Olin Hauser, DO. The care management team was consulted for assistance with care management and care coordination needs.    Review of patient status, including review of consultants reports, relevant laboratory and other test results, and collaboration with appropriate care team members and the patient's provider was performed as part of comprehensive patient evaluation and provision of chronic care management services.    LCSW completed CCM outreach attempt today but was unable to reach patient successfully. A HIPPA compliant voice message was left encouraging patient to return call once available. LCSW rescheduled CCM SW appointment as well.  A HIPPA compliant phone message was left for the patient providing contact information and requesting a return call.   [name], BSW, MSW, [email or web address] Phone: [phone]

## 2019-10-14 ENCOUNTER — Telehealth: Payer: Medicare Other

## 2019-10-16 ENCOUNTER — Encounter: Payer: Self-pay | Admitting: Family Medicine

## 2019-10-16 ENCOUNTER — Other Ambulatory Visit: Payer: Self-pay | Admitting: Family Medicine

## 2019-10-16 ENCOUNTER — Other Ambulatory Visit: Payer: Self-pay

## 2019-10-16 ENCOUNTER — Ambulatory Visit (INDEPENDENT_AMBULATORY_CARE_PROVIDER_SITE_OTHER): Payer: Medicare Other | Admitting: Family Medicine

## 2019-10-16 VITALS — BP 122/56 | HR 94 | Resp 16 | Ht 65.5 in | Wt 216.0 lb

## 2019-10-16 DIAGNOSIS — M8949 Other hypertrophic osteoarthropathy, multiple sites: Secondary | ICD-10-CM

## 2019-10-16 DIAGNOSIS — I872 Venous insufficiency (chronic) (peripheral): Secondary | ICD-10-CM | POA: Diagnosis not present

## 2019-10-16 DIAGNOSIS — F3342 Major depressive disorder, recurrent, in full remission: Secondary | ICD-10-CM

## 2019-10-16 DIAGNOSIS — J432 Centrilobular emphysema: Secondary | ICD-10-CM

## 2019-10-16 DIAGNOSIS — N182 Chronic kidney disease, stage 2 (mild): Secondary | ICD-10-CM

## 2019-10-16 DIAGNOSIS — R7309 Other abnormal glucose: Secondary | ICD-10-CM

## 2019-10-16 DIAGNOSIS — Z0000 Encounter for general adult medical examination without abnormal findings: Secondary | ICD-10-CM

## 2019-10-16 DIAGNOSIS — E559 Vitamin D deficiency, unspecified: Secondary | ICD-10-CM

## 2019-10-16 DIAGNOSIS — F419 Anxiety disorder, unspecified: Secondary | ICD-10-CM

## 2019-10-16 DIAGNOSIS — G894 Chronic pain syndrome: Secondary | ICD-10-CM

## 2019-10-16 DIAGNOSIS — I129 Hypertensive chronic kidney disease with stage 1 through stage 4 chronic kidney disease, or unspecified chronic kidney disease: Secondary | ICD-10-CM | POA: Diagnosis not present

## 2019-10-16 DIAGNOSIS — M159 Polyosteoarthritis, unspecified: Secondary | ICD-10-CM

## 2019-10-16 DIAGNOSIS — E876 Hypokalemia: Secondary | ICD-10-CM

## 2019-10-16 DIAGNOSIS — E782 Mixed hyperlipidemia: Secondary | ICD-10-CM

## 2019-10-16 DIAGNOSIS — R7303 Prediabetes: Secondary | ICD-10-CM

## 2019-10-16 LAB — POCT GLYCOSYLATED HEMOGLOBIN (HGB A1C): Hemoglobin A1C: 5.6 % (ref 4.0–5.6)

## 2019-10-16 NOTE — Assessment & Plan Note (Signed)
Followed by Vascular Dr Lucky Cowboy Chronic problem LE venous reflux Failed Thiaizde  Discussed edema and her BP symptoms Continue current therapy including Chlorthalidone Continue RICE therapy.

## 2019-10-16 NOTE — Assessment & Plan Note (Signed)
Chronic problem, currently stable without flare Followed by [facility] On Symbicort and Albuterol

## 2019-10-16 NOTE — Assessment & Plan Note (Signed)
Well controlled PreDM A1c improved to A1c 5.6 Concern with obesity, HTN, HLD  Concern with neuropathy symptoms  Plan:  1. Not on any therapy currently  2. Encourage improved lifestyle - low carb, low sugar diet, reduce portion size, continue improving regular exercise 

## 2019-10-16 NOTE — Progress Notes (Signed)
Subjective:    Patient ID: Candace Tapia, female    DOB: 08/02/1935, 84 y.o.   MRN: [id number]  Candace Tapia is a 84 y.o. female presenting on 10/16/2019 for Hypertension   HPI   Pre-Diabetes/ obesity BMI >35 Reports no concerns. Last A1ctrend 5.7. Due today Meds:Never on meds Limited diet and exercise, admits difficulty exercising due to back hip pain arthritis, and often still eating sweets at time Denies hypoglycemia, polyuria, visual changes, numbness or tingling.  CHRONIC HTN/ Peripheral Edema / Hypokalemia Previously 3 months ago taken off Chlorthalidone question about dizziness, then it did not improve her dizziness but had worse swelling and elevated BP, was placed back on Chlorthalidone., taken off furosemide PRN Admits lightheaded dizzy at times. Now she is not as active not having as much swelling since not on her feet. Current Meds -Losartan 100mg  daily, Chlorthalidone 25mg  daily, Amlodipine 5mg  daily Reports good compliance, took meds today. Taking Potassium 77mEq daily now  Admits chronic problem weak and swelling in legs arthritis  Chronic Vestibular disorder Followed by [facility] She has had some dizziness episodes lightheadedness  Recurrent Depression, - in remission Reports she has prior history of some depressed mood but overall has managed to cope with it for long time and currently doing well. She says she is alone. She has limited family members around. She has some friends, one with alzheimer's dementia. Cannot tolerate medication, remains off  Centrilobular Emphysema(COPD) Followed by Dr Minerva Areola Pulm On oxygen at night, nocturnal up to 2L Using inhalersContinueSymbicort 160/4.5 two puffs bid, albuterolPRN She is doing well but often can get short of breath with exertion, worse with mask  HM Due for COVID19 vaccine, given information today,she is worried to get vaccine if she has COPD.  Depression screen [facility] 2/9 10/16/2019  07/10/2019 06/06/2019  Decreased Interest 0 0 0  Down, Depressed, Hopeless 0 0 0  PHQ - 2 Score 0 0 0  Altered sleeping 0 0 -  Tired, decreased energy 1 0 -  Change in appetite 0 0 -  Feeling bad or failure about yourself  0 0 -  Trouble concentrating 0 0 -  Moving slowly or fidgety/restless 0 0 -  Suicidal thoughts 0 0 -  PHQ-9 Score 1 0 -  Difficult doing work/chores Not difficult at all Not difficult at all -  Some recent data might be hidden   No flowsheet data found.   Social History   Tobacco Use  . Smoking status: Former Smoker    Packs/day: 2.00    Years: 40.00    Pack years: 80.00    Quit date: 04/19/1993    Years since quitting: 26.5  . Smokeless tobacco: Never Used  Vaping Use  . Vaping Use: Never assessed  Substance Use Topics  . Alcohol use: No    Alcohol/week: 0.0 standard drinks  . Drug use: No    Review of Systems Per HPI unless specifically indicated above     Objective:    BP (!) 122/56   Pulse 94   Resp 16   Ht 5' 5.5" (1.664 m)   Wt 216 lb (98 kg)   SpO2 98%   BMI 35.40 kg/m   Wt Readings from Last 3 Encounters:  10/16/19 216 lb (98 kg)  08/18/19 220 lb (99.8 kg)  07/10/19 219 lb 9.6 oz (99.6 kg)    Physical Exam Vitals and nursing note reviewed.  Constitutional:      General: She is not in  acute distress.    Appearance: She is well-developed. She is not diaphoretic.     Comments: Well-appearing, comfortable, cooperative  HENT:     Head: Normocephalic and atraumatic.  Eyes:     General:        Right eye: No discharge.        Left eye: No discharge.     Conjunctiva/sclera: Conjunctivae normal.  Neck:     Thyroid: No thyromegaly.  Cardiovascular:     Rate and Rhythm: Normal rate and regular rhythm.     Heart sounds: Normal heart sounds. No murmur heard.   Pulmonary:     Effort: Pulmonary effort is normal. No respiratory distress.     Breath sounds: Normal breath sounds. No wheezing or rales.  Musculoskeletal:        General:  Normal range of motion.     Cervical back: Normal range of motion and neck supple.     Right lower leg: Edema (varicose veins, +1-2 pitting edema) present.     Left lower leg: Edema (varicose veins, +1-2 pitting edema) present.  Lymphadenopathy:     Cervical: No cervical adenopathy.  Skin:    General: Skin is warm and dry.     Findings: No erythema or rash.  Neurological:     Mental Status: She is alert and oriented to person, place, and time.  Psychiatric:        Behavior: Behavior normal.     Comments: Well groomed, good eye contact, normal speech and thoughts      Recent Labs    01/02/19 0820 07/10/19 1437 10/16/19 1419  HGBA1C 5.7* 5.7* 5.6     Results for orders placed or performed in visit on 10/16/19  POCT HgB A1C  Result Value Ref Range   Hemoglobin A1C 5.6 4.0 - 5.6 %      Assessment & Plan:   Problem List Items Addressed This Visit    Pre-diabetes - Primary    Well controlled PreDM A1c improved to A1c 5.6 Concern with obesity, HTN, HLD  Concern with neuropathy symptoms  Plan:  1. Not on any therapy currently  2. Encourage improved lifestyle - low carb, low sugar diet, reduce portion size, continue improving regular exercise      Relevant Orders   POCT HgB A1C (Completed)   Osteoarthritis of multiple joints   Major depression in full remission ([place])    Remains improved mood, in remission - she does seem lonely and frustrated but she seems to be coping well, has limited social support. Chronic history of MDD  Patient already established with CCM / Care Guides  Follow-up as planned      Chronic venous insufficiency    Followed by Vascular Dr Lucky Cowboy Chronic problem LE venous reflux Failed Thiaizde  Discussed edema and her BP symptoms Continue current therapy including Chlorthalidone Continue RICE therapy.      Centrilobular emphysema (HCC)    Chronic problem, currently stable without flare Followed by [facility] On Symbicort and  Albuterol      Benign hypertension with CKD (chronic kidney disease), stage II    Controlled HTN back on chlorthalidone regimen. Some dizziness is chronic, seems unrelated to medication side effect or HTN - Home BP readings improved Complication with CKD-II    Plan:  Continue current BP regimen Losartan 100mg  daily, Amlodipine 5mg , Chlorthalidone 25mg  daily Encourage improved lifestyle - low sodium diet, regular exercise Continue monitor BP outside office, bring readings to next visit, if persistently >140/90 or  new symptoms notify office sooner         No orders of the defined types were placed in this encounter.     Follow up plan: Return in about 4 months (around 02/15/2020) for Annual Physical.  Future labs ordered for 02/17/20   Nobie Putnam, [facility] 10/16/2019, 2:14 PM

## 2019-10-16 NOTE — Patient Instructions (Addendum)
Thank you for coming to the office today.  [place]:  [facility]) [place] [address]  Hours: Monday - Sunday 8:00am to 12:00pm  COVID-19 Vaccines By Appointment Only  Sign up for [place] List  [email or web address] or text "VACCINE" to [phone] or call [phone]   Recent Labs    01/02/19 0820 07/10/19 1437 10/16/19 1419  HGBA1C 5.7* 5.7* 5.6     DUE for FASTING BLOOD WORK (no food or drink after midnight before the lab appointment, only water or coffee without cream/sugar on the morning of)  SCHEDULE "Lab Only" visit in the morning at the clinic for lab draw in 4 MONTHS   - Make sure Lab Only appointment is at about 1 week before your next appointment, so that results will be available  For Lab Results, once available within 2-3 days of blood draw, you can can log in to MyChart online to view your results and a brief explanation. Also, we can discuss results at next follow-up visit.   Please schedule a Follow-up Appointment to: Return in about 4 months (around 02/15/2020) for Annual Physical.  If you have any other questions or concerns, please feel free to call the office or send a message through [place]. You may also schedule an earlier appointment if necessary.  Additionally, you may be receiving a survey about your experience at our office within a few days to 1 week by e-mail or mail. We value your feedback.  Nobie Putnam, DO [place]

## 2019-10-16 NOTE — Assessment & Plan Note (Signed)
Controlled HTN back on chlorthalidone regimen. Some dizziness is chronic, seems unrelated to medication side effect or HTN - Home BP readings improved Complication with CKD-II    Plan:  Continue current BP regimen Losartan 100mg  daily, Amlodipine 5mg , Chlorthalidone 25mg  daily Encourage improved lifestyle - low sodium diet, regular exercise Continue monitor BP outside office, bring readings to next visit, if persistently >140/90 or new symptoms notify office sooner

## 2019-10-16 NOTE — Assessment & Plan Note (Signed)
Remains improved mood, in remission - she does seem lonely and frustrated but she seems to be coping well, has limited social support. Chronic history of MDD  Patient already established with CCM / Care Guides  Follow-up as planned

## 2019-10-29 ENCOUNTER — Telehealth: Payer: Medicare Other

## 2019-10-30 ENCOUNTER — Ambulatory Visit: Payer: Medicare Other | Admitting: Pharmacist

## 2019-10-30 DIAGNOSIS — J432 Centrilobular emphysema: Secondary | ICD-10-CM

## 2019-10-30 DIAGNOSIS — I129 Hypertensive chronic kidney disease with stage 1 through stage 4 chronic kidney disease, or unspecified chronic kidney disease: Secondary | ICD-10-CM

## 2019-10-30 NOTE — Chronic Care Management (AMB) (Signed)
Chronic Care Management   Follow Up Note   10/30/2019 Name: Candace Tapia MRN: [id number] DOB: 1936/02/11  Referred by: Olin Hauser, DO Reason for referral : Chronic Care Management (Patient Phone Call)   Candace Tapia is a 84 y.o. year old female who is a primary care patient of Olin Hauser, DO. The CCM team was consulted for assistance with chronic disease management and care coordination needs.    I reached out to [name] by phone today.   Review of patient status, including review of consultants reports, relevant laboratory and other test results, and collaboration with appropriate care team members and the patient's provider was performed as part of comprehensive patient evaluation and provision of chronic care management services.     Outpatient Encounter Medications as of 10/30/2019  Medication Sig Note  . acetaminophen (TYLENOL) 650 MG CR tablet Take 650 mg by mouth every 8 (eight) hours as needed for pain.   Marland Kitchen albuterol (PROAIR HFA) 108 (90 BASE) MCG/ACT inhaler Inhale 1 puff into the lungs every 6 (six) hours as needed.    Marland Kitchen amLODipine (NORVASC) 5 MG tablet Take 1 tablet (5 mg total) by mouth daily.   Marland Kitchen aspirin 81 MG chewable tablet Chew 81 mg by mouth daily as needed.  (Patient not taking: Reported on 10/16/2019) 01/01/2019: Takes every once in a while  . budesonide-formoterol (SYMBICORT) 160-4.5 MCG/ACT inhaler Inhale 2 puffs into the lungs 2 (two) times daily.   . chlorthalidone (HYGROTON) 25 MG tablet Take 1 tablet (25 mg total) by mouth daily.   . Cholecalciferol (KP VITAMIN D3) 25 MCG (1000 UT) capsule Vitamin D3 25 mcg (1,000 unit) capsule  1 po qd   . fluticasone (FLONASE) 50 MCG/ACT nasal spray Place 1 spray into both nostrils as needed.    . gabapentin (NEURONTIN) 400 MG capsule TAKE 1 CAPSULE(400 MG) BY MOUTH TWICE DAILY   . losartan (COZAAR) 100 MG tablet Take 1 tablet (100 mg total) by mouth daily.   . meclizine (ANTIVERT)  25 MG tablet Take 0.5 tablets (12.5 mg total) by mouth every 8 (eight) hours as needed for dizziness.   . Multiple Vitamins-Minerals (MULTIVITAMIN WOMEN 50+) TABS Take 1 tablet by mouth daily.    Marland Kitchen NEXIUM 20 MG capsule Take 1 capsule (20 mg total) by mouth daily.   . potassium chloride (KLOR-CON SPRINKLE) 10 MEQ CR capsule Take 1 capsule (10 mEq total) by mouth daily. Open capsule, sprinkle over soft food, swallow whole.   Marland Kitchen Spacer/Aero-Holding Chambers (AEROCHAMBER PLUS) inhaler Use as instructed   . [DISCONTINUED] fexofenadine (ALLEGRA ALLERGY) 180 MG tablet Take 180 mg by mouth as needed. Reported on 09/29/2015    No facility-administered encounter medications on file as of 10/30/2019.    Goals Addressed            This Visit's Progress   . PharmD - Medication review       CARE PLAN ENTRY (see longitudinal plan of care for additional care plan information)  Current Barriers:  Marland Kitchen Knowledge deficits related to basic COPD self care/management . Knowledge deficit related to basic understanding of how to use inhalers and how inhaled medications work   . Limited Social Support  Pharmacist Clinical Goal(s):  Marland Kitchen Over the next 30 days, patient will work with CM Pharmacist to address needs related to medication adherence and medication regimen optimization  Interventions: . Perform chart review  . Follow up regarding BP control and monitoring ? Reports taking:  Amlodipine 5 mg once daily  Losartan 100 mg once daily  Chlorthalidone 25 mg once daily ? Reports is checking home BP every couple of days and states that readings have been "good", but denies having recorded readings. Again encourage patient to continue checking BP and keep log.  ? Note per encounter from office visit with PCP on 6/30, BP was 122/56, HR 94 during visit ? Reports continues to drink [facility] Zero Sugar soda, but is trying to cut down from ~3 cans/day to 1 can/day.  ? Remind patient to follow up with PCP office  if BP remains persistently >140/90 or new symptoms occur, as directed by PCP . Counsel patient on importance of medication adherence ? Again counsel on importance of using maintenance inhaler (Symbicort) and rescue (albuerol) inhalers as directed and rinsing out mouth after use of Symbicort ? Confirms continuing to use weekly pillbox . Follow up with patient regarding previous interest in assisted living/adult day programs.  ? From review of chart, note CCM Social Worker scheduled to call patient next week ? Advise patient to expect a call from [place].  Marland Kitchen Counsel patient to follow up with provider office for new or worsening medical concerns  Patient Self Care Activities:  . Attends all scheduled provider appointments . Calls pharmacy for medication refills . Calls provider office for new concerns or questions  Please see past updates related to this goal by clicking on the "Past Updates" button in the selected goal         Plan  The care management team will reach out to the patient again over the next 30 days.   Harlow Asa, PharmD, [place] [employer] [phone]

## 2019-10-30 NOTE — Patient Instructions (Signed)
Thank you allowing the Chronic Care Management Team to be a part of your care! It was a pleasure speaking with you today!     CCM (Chronic Care Management) Team    Noreene Larsson RN, MSN, CCM Nurse Care Coordinator  [phone]   Harlow Asa PharmD  Clinical Pharmacist  [phone]   [name] LCSW Clinical Social Worker [phone]  Visit Information  Goals Addressed            This Visit's Progress   . PharmD - Medication review       CARE PLAN ENTRY (see longitudinal plan of care for additional care plan information)  Current Barriers:  Marland Kitchen Knowledge deficits related to basic COPD self care/management . Knowledge deficit related to basic understanding of how to use inhalers and how inhaled medications work   . Limited Social Support  Pharmacist Clinical Goal(s):  Marland Kitchen Over the next 30 days, patient will work with CM Pharmacist to address needs related to medication adherence and medication regimen optimization  Interventions: . Perform chart review  . Follow up regarding BP control and monitoring ? Reports taking:  Amlodipine 5 mg once daily  Losartan 100 mg once daily  Chlorthalidone 25 mg once daily ? Reports is checking home BP every couple of days and states that readings have been "good", but denies having recorded readings. Again encourage patient to continue checking BP and keep log.  ? Note per encounter from office visit with PCP on 6/30, BP was 122/56, HR 94 during visit ? Reports continues to drink [facility] Zero Sugar soda, but is trying to cut down from ~3 cans/day to 1 can/day.  ? Remind patient to follow up with PCP office if BP remains persistently >140/90 or new symptoms occur, as directed by PCP . Counsel patient on importance of medication adherence ? Again counsel on importance of using maintenance inhaler (Symbicort) and rescue (albuerol) inhalers as directed and rinsing out mouth after use of Symbicort ? Confirms continuing to use  weekly pillbox . Follow up with patient regarding previous interest in assisted living/adult day programs.  ? From review of chart, note CCM Social Worker scheduled to call patient next week ? Advise patient to expect a call from [place].  Marland Kitchen Counsel patient to follow up with provider office for new or worsening medical concerns  Patient Self Care Activities:  . Attends all scheduled provider appointments . Calls pharmacy for medication refills . Calls provider office for new concerns or questions  Please see past updates related to this goal by clicking on the "Past Updates" button in the selected goal         Patient verbalizes understanding of instructions provided today.   The care management team will reach out to the patient again over the next 30 days.   Harlow Asa, PharmD, [place] [employer] [phone]

## 2019-11-05 ENCOUNTER — Ambulatory Visit (INDEPENDENT_AMBULATORY_CARE_PROVIDER_SITE_OTHER): Payer: Medicare Other | Admitting: Licensed Clinical Social Worker

## 2019-11-05 DIAGNOSIS — N182 Chronic kidney disease, stage 2 (mild): Secondary | ICD-10-CM | POA: Diagnosis not present

## 2019-11-05 DIAGNOSIS — I129 Hypertensive chronic kidney disease with stage 1 through stage 4 chronic kidney disease, or unspecified chronic kidney disease: Secondary | ICD-10-CM

## 2019-11-05 DIAGNOSIS — M8949 Other hypertrophic osteoarthropathy, multiple sites: Secondary | ICD-10-CM | POA: Diagnosis not present

## 2019-11-05 DIAGNOSIS — M159 Polyosteoarthritis, unspecified: Secondary | ICD-10-CM

## 2019-11-05 DIAGNOSIS — J432 Centrilobular emphysema: Secondary | ICD-10-CM

## 2019-11-05 DIAGNOSIS — F3342 Major depressive disorder, recurrent, in full remission: Secondary | ICD-10-CM

## 2019-11-05 DIAGNOSIS — G894 Chronic pain syndrome: Secondary | ICD-10-CM

## 2019-11-07 ENCOUNTER — Telehealth: Payer: Medicare Other

## 2019-11-11 NOTE — Chronic Care Management (AMB) (Signed)
Chronic Care Management    Clinical Social Work Follow Up Note  11/11/2019 Name: Candace Tapia MRN: [id number] DOB: 05-02-1935  Candace Tapia is a 84 y.o. year old female who is a primary care patient of Candace Hauser, DO. The CCM team was consulted for assistance with Level of Care Concerns.   Review of patient status, including review of consultants reports, other relevant assessments, and collaboration with appropriate care team members and the patient's provider was performed as part of comprehensive patient evaluation and provision of chronic care management services.    SDOH (Social Determinants of Health) assessments performed: Yes    Outpatient Encounter Medications as of 11/05/2019  Medication Sig Note  . acetaminophen (TYLENOL) 650 MG CR tablet Take 650 mg by mouth every 8 (eight) hours as needed for pain.   Marland Kitchen albuterol (PROAIR HFA) 108 (90 BASE) MCG/ACT inhaler Inhale 1 puff into the lungs every 6 (six) hours as needed.    Marland Kitchen amLODipine (NORVASC) 5 MG tablet Take 1 tablet (5 mg total) by mouth daily.   Marland Kitchen aspirin 81 MG chewable tablet Chew 81 mg by mouth daily as needed.  (Patient not taking: Reported on 10/16/2019) 01/01/2019: Takes every once in a while  . budesonide-formoterol (SYMBICORT) 160-4.5 MCG/ACT inhaler Inhale 2 puffs into the lungs 2 (two) times daily.   . chlorthalidone (HYGROTON) 25 MG tablet Take 1 tablet (25 mg total) by mouth daily.   . Cholecalciferol (KP VITAMIN D3) 25 MCG (1000 UT) capsule Vitamin D3 25 mcg (1,000 unit) capsule  1 po qd   . fluticasone (FLONASE) 50 MCG/ACT nasal spray Place 1 spray into both nostrils as needed.    . gabapentin (NEURONTIN) 400 MG capsule TAKE 1 CAPSULE(400 MG) BY MOUTH TWICE DAILY   . losartan (COZAAR) 100 MG tablet Take 1 tablet (100 mg total) by mouth daily.   . meclizine (ANTIVERT) 25 MG tablet Take 0.5 tablets (12.5 mg total) by mouth every 8 (eight) hours as needed for dizziness.   . Multiple  Vitamins-Minerals (MULTIVITAMIN WOMEN 50+) TABS Take 1 tablet by mouth daily.    Marland Kitchen NEXIUM 20 MG capsule Take 1 capsule (20 mg total) by mouth daily.   . potassium chloride (KLOR-CON SPRINKLE) 10 MEQ CR capsule Take 1 capsule (10 mEq total) by mouth daily. Open capsule, sprinkle over soft food, swallow whole.   Marland Kitchen Spacer/Aero-Holding Chambers (AEROCHAMBER PLUS) inhaler Use as instructed    No facility-administered encounter medications on file as of 11/05/2019.     Goals Addressed    .  "I really need LTC placement or in home support." (pt-stated)        Current Barriers:  . Financial constraints related to affording in home support or eventual long term care placement . Limited social support . Level of care concerns . ADL IADL limitations . Limited access to caregiver . Inability to perform ADL's independently . Inability to perform IADL's independently . Ongoing depression  Clinical Social Work Clinical Goal(s):  Marland Kitchen Over the next 120 days, patient will work with SW to address concerns related to gaining additional support in the home until patient is able to pursue LTC placement  Interventions: . Patient interviewed and appropriate assessments performed . Provided patient with information about long term care placement (ALF) and payment sources-out of pocket or Medicaid. LCSW also provided education on personal care service resources within the area. Patient agreeable to Candace Tapia referral. . Discussed plans with patient for ongoing care management follow up and  provided patient with direct contact information for care management team . Advised patient to consider finding in home support. Patient found an aide that cost $20 per hour but is not able to afford this service. LCSW encouraged patient to network to see if she can find someone at a lesser rate. Candace Tapia with RN Case Manager re: need for care management services . Patient admits ongoing loneliness and depression. LCSW  provided active and reflective listening throughout conversation. Patient was receptive to emotional support that was provided.  . Assisted patient/caregiver with obtaining information about health plan benefits . Provided education and assistance to client regarding Advanced Directives. . Provided education to patient/caregiver regarding level of care options. Marland Kitchen UPDATE- LCSW successfully coordinated care with Home Care Providers and was able to place patient on the wait list for [facility]ace Tapia program on 06/20/19. Patient was advised to contact program to inquire about where she is on the wait list currently. . Patient reports that her daughter in law continues to get her groceries for her. She reports that her family visits her usually once per week on Sundays.  Patient Self Care Activities:  . Self administers medications as prescribed . Attends all scheduled provider appointments . Unable to independently take care of self within the home and is in need of additional personal care service involvement.   Please see past updates related to this goal by clicking on the "Past Updates" button in the selected goal       Follow Up Plan: SW will follow up with patient by phone over the next quarter  Candace Tapia, [place], MSW, [email or web address] Phone: [phone]

## 2019-11-28 ENCOUNTER — Telehealth: Payer: Medicare Other | Admitting: General Practice

## 2019-11-28 ENCOUNTER — Ambulatory Visit (INDEPENDENT_AMBULATORY_CARE_PROVIDER_SITE_OTHER): Payer: Medicare Other | Admitting: General Practice

## 2019-11-28 DIAGNOSIS — M8949 Other hypertrophic osteoarthropathy, multiple sites: Secondary | ICD-10-CM

## 2019-11-28 DIAGNOSIS — I129 Hypertensive chronic kidney disease with stage 1 through stage 4 chronic kidney disease, or unspecified chronic kidney disease: Secondary | ICD-10-CM

## 2019-11-28 DIAGNOSIS — J432 Centrilobular emphysema: Secondary | ICD-10-CM

## 2019-11-28 DIAGNOSIS — N182 Chronic kidney disease, stage 2 (mild): Secondary | ICD-10-CM

## 2019-11-28 DIAGNOSIS — F3342 Major depressive disorder, recurrent, in full remission: Secondary | ICD-10-CM | POA: Diagnosis not present

## 2019-11-28 DIAGNOSIS — E782 Mixed hyperlipidemia: Secondary | ICD-10-CM | POA: Diagnosis not present

## 2019-11-28 DIAGNOSIS — M159 Polyosteoarthritis, unspecified: Secondary | ICD-10-CM

## 2019-11-28 DIAGNOSIS — J449 Chronic obstructive pulmonary disease, unspecified: Secondary | ICD-10-CM | POA: Diagnosis not present

## 2019-11-28 DIAGNOSIS — G894 Chronic pain syndrome: Secondary | ICD-10-CM

## 2019-11-28 DIAGNOSIS — F419 Anxiety disorder, unspecified: Secondary | ICD-10-CM

## 2019-11-28 NOTE — Chronic Care Management (AMB) (Signed)
Chronic Care Management   Follow Up Note   11/28/2019 Name: Candace Tapia MRN: [id number] DOB: 06-30-1935  Referred by: Candace Hauser, DO Reason for referral : Chronic Care Management (RNCM follow up Chronic Disease Management and Care Coordination Needs)   Candace Tapia is a 84 y.o. year old female who is a primary care patient of Candace Hauser, DO. The CCM team was consulted for assistance with chronic disease management and care coordination needs.    Review of patient status, including review of consultants reports, relevant laboratory and other test results, and collaboration with appropriate care team members and the patient's provider was performed as part of comprehensive patient evaluation and provision of chronic care management services.    SDOH (Social Determinants of Health) assessments performed: Yes See Care Plan activities for detailed interventions related to [facility])     Outpatient Encounter Medications as of 11/28/2019  Medication Sig Note  . acetaminophen (TYLENOL) 650 MG CR tablet Take 650 mg by mouth every 8 (eight) hours as needed for pain.   Marland Kitchen albuterol (PROAIR HFA) 108 (90 BASE) MCG/ACT inhaler Inhale 1 puff into the lungs every 6 (six) hours as needed.    Marland Kitchen amLODipine (NORVASC) 5 MG tablet Take 1 tablet (5 mg total) by mouth daily.   Marland Kitchen aspirin 81 MG chewable tablet Chew 81 mg by mouth daily as needed.  (Patient not taking: Reported on 10/16/2019) 01/01/2019: Takes every once in a while  . budesonide-formoterol (SYMBICORT) 160-4.5 MCG/ACT inhaler Inhale 2 puffs into the lungs 2 (two) times daily.   . chlorthalidone (HYGROTON) 25 MG tablet Take 1 tablet (25 mg total) by mouth daily.   . Cholecalciferol (KP VITAMIN D3) 25 MCG (1000 UT) capsule Vitamin D3 25 mcg (1,000 unit) capsule  1 po qd   . fluticasone (FLONASE) 50 MCG/ACT nasal spray Place 1 spray into both nostrils as needed.    . gabapentin (NEURONTIN) 400 MG capsule TAKE 1  CAPSULE(400 MG) BY MOUTH TWICE DAILY   . losartan (COZAAR) 100 MG tablet Take 1 tablet (100 mg total) by mouth daily.   . meclizine (ANTIVERT) 25 MG tablet Take 0.5 tablets (12.5 mg total) by mouth every 8 (eight) hours as needed for dizziness.   . Multiple Vitamins-Minerals (MULTIVITAMIN WOMEN 50+) TABS Take 1 tablet by mouth daily.    Marland Kitchen NEXIUM 20 MG capsule Take 1 capsule (20 mg total) by mouth daily.   . potassium chloride (KLOR-CON SPRINKLE) 10 MEQ CR capsule Take 1 capsule (10 mEq total) by mouth daily. Open capsule, sprinkle over soft food, swallow whole.   Marland Kitchen Spacer/Aero-Holding Chambers (AEROCHAMBER PLUS) inhaler Use as instructed   . [DISCONTINUED] fexofenadine (ALLEGRA ALLERGY) 180 MG tablet Take 180 mg by mouth as needed. Reported on 09/29/2015    No facility-administered encounter medications on file as of 11/28/2019.     Objective:  BP Readings from Last 3 Encounters:  10/16/19 (!) 122/56  10/02/19 137/70  08/18/19 119/74    Goals Addressed              This Visit's Progress   .  RNCM: I do the best I can and get by        Current Barriers:  . Chronic Disease Management support, education, and care coordination needs related to HTN, COPD, Anxiety, and Depression . Social Isolation/Limited support network . Limited Mobility  Clinical Goal(s) related to HTN, COPD, Anxiety, and Depression:  Over the next  120 days, patient will:  .  Work with the care management team to address educational, disease management, and care coordination needs  . Begin or continue self health monitoring activities as directed today Measure and record blood pressure 4 times per week . Call provider office for new or worsened signs and symptoms Blood pressure findings outside established parameters, Oxygen saturation lower than established parameter, Shortness of breath, and New or worsened symptom related to Depression and anxiety . Call care management team with questions or concerns . Verbalize  basic understanding of patient centered plan of care established today  Interventions related to HTN, COPD, Anxiety, and Depression:  . Evaluation of current treatment plans and patient's adherence to plan as established by provider.  The patient verbalized compliance with her plan of care. The patient does not take statins, review of notes reveal she can not tolerate and does not want to try a lower dose.  The patient does take ASA. Marland Kitchen Assessed patient understanding of disease states.  The patient verbalized understanding of her chronic conditions; however states she is "giddy" and forgetful.  Her biggest issue is social isolation and being by herself a lot. 10-03-2019: Discussed ways to increase social activity. The patient is a "natural loner", but she wants to have companionship now. The patient states she sees her sons about an hour a week. She use to talk to her brothers and sisters on the phone but she is the only living sibling left. The patient says she does not qualify for Medicaid services but she will check with [facility] insurance about her benefits. She may have to consider moving to a LTC facility or assisted living. She says she has good days and bad days and today is a good day. 11-28-2019: The patient is stable today and having a good day. She has been reading and watching tv but staying in due to the hot weather and resting her legs elevated as needed.  . Assessed patient's education and care coordination needs.  The patient verbalized sometimes she thinks about going to an assisted living but she is unsure. Is receptive to talking to the care guide about assisted living places in her area. Will place a care guide referral for assistance. 10-03-2019: The patient has talked to the care guides and the LCSW about facilities. The patient is not sure what to do but feels in the future this is something she will have to do.  . Provided disease specific education to patient.  Education on following a Heart  Healthy/low carb diet.  The patient states she tries to watch her sodium content and fats and needs to lose some weight. She takes a fluid pill, denies any fluid in her feet and legs.  10-03-2019: The patient endorses a heart healthy diet.  Discussed watching sugar content as well. The patient is prediabetic. The patient states bother of her parents were diabetic and some of her siblings were. The patient states that she has to be careful with sweets because once they are in her house she likes to eat all of it at one time. Denies any issues with dietary restrictions at this time. 11-28-2019: the patient is getting some fresh fruits and vegetables from family. She is eating well.  . Evaluation of depression specifically. The patient has a long standing history of this. She states medications make it worse.  Her one friend who she use to do things with is declining in health and memory problems therefore she does not have that anymore. She has not hobbies she  likes to do. Talked to the patient about coping skills and possibly having outings with her daughter in law. She states her daughter in law works and this is not always possible.  Discussed audio books but the patient was unsure of this. 10-03-2019: The patient is open to ideas to help with her depression. Hobbies that use to interest her do not now. The patient is open to ideas and admits she is curious about things. She will let the RNCM know ideas to help her with her depression. Also working with LCSW. 11-28-2019: The patient is reading and watching a lot of TV.  The patient was in a good mood today and feels she is doing well right now. Continues to do what she needs to do to maintain her health and well being. States her blood pressures are WNL. Denies any headaches. The patient also is staying inside do to the extreme hot weather. She states she does not want her breathing to get worse.  Nash Dimmer with appropriate clinical care team members regarding  patient needs.  Patient is currently involved with LCSW and pharmacist.  . Evaluation of upcoming appointments: PCP appointment on 02-18-2020 at 11:20 am  Patient Self Care Activities related to HTN, COPD, Anxiety, and Depression:  . Patient is unable to independently self-manage chronic health conditions  Please see past updates related to this goal by clicking on the "Past Updates" button in the selected goal      .  RNCM: pt-"I am having a time some days with my arthritis pain" (pt-stated)        CARE PLAN ENTRY (see longitudinal plan of care for additional care plan information)  Current Barriers:  Marland Kitchen Knowledge Deficits related to chronic osteoarthritis pain . Chronic Disease Management support and education needs related to chronic osteoarthritis pain . Lacks caregiver support.   Nurse Case Manager Clinical Goal(s):  Marland Kitchen Over the next 120 days, patient will verbalize understanding of plan for pain management for chronic osteoarthritis  . Over the next 120 days, patient will work with [facility] [place] team and pcp to address needs related to osteoarthritis . Over the next 120 days, patient will work with CM team pharmacist to assist with medications management and pain medication recommendations  Interventions:  . Inter-disciplinary care team collaboration (see longitudinal plan of care) . Evaluation of current treatment plan related to pain management  and patient's adherence to plan as established by provider. . Advised patient to notify the provider of changes in level or intensity of pain . Provided education to patient re: alternative measures to help with pain management. The patient currently uses tylenol arthritis for pain relief.  . Reviewed medications with patient and discussed compliance. The patient has been working with the pharmacist with compliance and medication management  . Collaborated with CCM team and pcp regarding pain management and patient needs.  11-28-2019: The patient  states her pain level is the same. Denies any acute changes in pain. States she has good days and bad days. Today is a good day.  . Discussed plans with patient for ongoing care management follow up and provided patient with direct contact information for care management team . Reviewed scheduled/upcoming provider appointments including: Appointment with pcp on 02-18-2020 at 11:20 am.  Patient Self Care Activities:  . Patient verbalizes understanding of plan to talk to pcp about changes in her pain and recommendations for pain management  . Attends all scheduled provider appointments . Calls provider office for new concerns  or questions . Unable to independently manage chronic pain from osteoarthritis   Please see past updates related to this goal by clicking on the "Past Updates" button in the selected goal          Plan:   Telephone follow up appointment with care management team member scheduled for: 02-06-2020 at 0900 am   Noreene Larsson RN, MSN, [facility] Mobile: [phone]

## 2019-11-28 NOTE — Patient Instructions (Signed)
Visit Information  Goals Addressed              This Visit's Progress   .  RNCM: I do the best I can and get by        Current Barriers:  . Chronic Disease Management support, education, and care coordination needs related to HTN, COPD, Anxiety, and Depression . Social Isolation/Limited support network . Limited Mobility  Clinical Goal(s) related to HTN, COPD, Anxiety, and Depression:  Over the next  120 days, patient will:  . Work with the care management team to address educational, disease management, and care coordination needs  . Begin or continue self health monitoring activities as directed today Measure and record blood pressure 4 times per week . Call provider office for new or worsened signs and symptoms Blood pressure findings outside established parameters, Oxygen saturation lower than established parameter, Shortness of breath, and New or worsened symptom related to Depression and anxiety . Call care management team with questions or concerns . Verbalize basic understanding of patient centered plan of care established today  Interventions related to HTN, COPD, Anxiety, and Depression:  . Evaluation of current treatment plans and patient's adherence to plan as established by provider.  The patient verbalized compliance with her plan of care. The patient does not take statins, review of notes reveal she can not tolerate and does not want to try a lower dose.  The patient does take ASA. Marland Kitchen Assessed patient understanding of disease states.  The patient verbalized understanding of her chronic conditions; however states she is "giddy" and forgetful.  Her biggest issue is social isolation and being by herself a lot. 10-03-2019: Discussed ways to increase social activity. The patient is a "natural loner", but she wants to have companionship now. The patient states she sees her sons about an hour a week. She use to talk to her brothers and sisters on the phone but she is the only living  sibling left. The patient says she does not qualify for Medicaid services but she will check with [facility] insurance about her benefits. She may have to consider moving to a LTC facility or assisted living. She says she has good days and bad days and today is a good day. 11-28-2019: The patient is stable today and having a good day. She has been reading and watching tv but staying in due to the hot weather and resting her legs elevated as needed.  . Assessed patient's education and care coordination needs.  The patient verbalized sometimes she thinks about going to an assisted living but she is unsure. Is receptive to talking to the care guide about assisted living places in her area. Will place a care guide referral for assistance. 10-03-2019: The patient has talked to the care guides and the LCSW about facilities. The patient is not sure what to do but feels in the future this is something she will have to do.  . Provided disease specific education to patient.  Education on following a Heart Healthy/low carb diet.  The patient states she tries to watch her sodium content and fats and needs to lose some weight. She takes a fluid pill, denies any fluid in her feet and legs.  10-03-2019: The patient endorses a heart healthy diet.  Discussed watching sugar content as well. The patient is prediabetic. The patient states bother of her parents were diabetic and some of her siblings were. The patient states that she has to be careful with sweets because once they  are in her house she likes to eat all of it at one time. Denies any issues with dietary restrictions at this time. 11-28-2019: the patient is getting some fresh fruits and vegetables from family. She is eating well.  . Evaluation of depression specifically. The patient has a long standing history of this. She states medications make it worse.  Her one friend who she use to do things with is declining in health and memory problems therefore she does not have that  anymore. She has not hobbies she likes to do. Talked to the patient about coping skills and possibly having outings with her daughter in law. She states her daughter in law works and this is not always possible.  Discussed audio books but the patient was unsure of this. 10-03-2019: The patient is open to ideas to help with her depression. Hobbies that use to interest her do not now. The patient is open to ideas and admits she is curious about things. She will let the RNCM know ideas to help her with her depression. Also working with LCSW. 11-28-2019: The patient is reading and watching a lot of TV.  The patient was in a good mood today and feels she is doing well right now. Continues to do what she needs to do to maintain her health and well being. States her blood pressures are WNL. Denies any headaches. The patient also is staying inside do to the extreme hot weather. She states she does not want her breathing to get worse.  Nash Dimmer with appropriate clinical care team members regarding patient needs.  Patient is currently involved with LCSW and pharmacist.  . Evaluation of upcoming appointments: PCP appointment on 02-18-2020 at 11:20 am  Patient Self Care Activities related to HTN, COPD, Anxiety, and Depression:  . Patient is unable to independently self-manage chronic health conditions  Please see past updates related to this goal by clicking on the "Past Updates" button in the selected goal      .  RNCM: pt-"I am having a time some days with my arthritis pain" (pt-stated)        CARE PLAN ENTRY (see longitudinal plan of care for additional care plan information)  Current Barriers:  Marland Kitchen Knowledge Deficits related to chronic osteoarthritis pain . Chronic Disease Management support and education needs related to chronic osteoarthritis pain . Lacks caregiver support.   Nurse Case Manager Clinical Goal(s):  Marland Kitchen Over the next 120 days, patient will verbalize understanding of plan for pain management  for chronic osteoarthritis  . Over the next 120 days, patient will work with [facility] [place] team and pcp to address needs related to osteoarthritis . Over the next 120 days, patient will work with CM team pharmacist to assist with medications management and pain medication recommendations  Interventions:  . Inter-disciplinary care team collaboration (see longitudinal plan of care) . Evaluation of current treatment plan related to pain management  and patient's adherence to plan as established by provider. . Advised patient to notify the provider of changes in level or intensity of pain . Provided education to patient re: alternative measures to help with pain management. The patient currently uses tylenol arthritis for pain relief.  . Reviewed medications with patient and discussed compliance. The patient has been working with the pharmacist with compliance and medication management  . Collaborated with CCM team and pcp regarding pain management and patient needs.  11-28-2019: The patient states her pain level is the same. Denies any acute changes in pain.  States she has good days and bad days. Today is a good day.  . Discussed plans with patient for ongoing care management follow up and provided patient with direct contact information for care management team . Reviewed scheduled/upcoming provider appointments including: Appointment with pcp on 02-18-2020 at 11:20 am.  Patient Self Care Activities:  . Patient verbalizes understanding of plan to talk to pcp about changes in her pain and recommendations for pain management  . Attends all scheduled provider appointments . Calls provider office for new concerns or questions . Unable to independently manage chronic pain from osteoarthritis   Please see past updates related to this goal by clicking on the "Past Updates" button in the selected goal         Patient verbalizes understanding of instructions provided today.   Telephone follow up  appointment with care management team member scheduled for: 02-06-2020 at 0900 am  Noreene Larsson RN, MSN, [place] Mobile: [phone]

## 2019-12-16 ENCOUNTER — Ambulatory Visit: Payer: Medicare Other | Admitting: Pharmacist

## 2019-12-16 DIAGNOSIS — I129 Hypertensive chronic kidney disease with stage 1 through stage 4 chronic kidney disease, or unspecified chronic kidney disease: Secondary | ICD-10-CM

## 2019-12-16 DIAGNOSIS — E782 Mixed hyperlipidemia: Secondary | ICD-10-CM | POA: Diagnosis not present

## 2019-12-16 DIAGNOSIS — F3342 Major depressive disorder, recurrent, in full remission: Secondary | ICD-10-CM | POA: Diagnosis not present

## 2019-12-16 DIAGNOSIS — J449 Chronic obstructive pulmonary disease, unspecified: Secondary | ICD-10-CM | POA: Diagnosis not present

## 2019-12-16 DIAGNOSIS — J432 Centrilobular emphysema: Secondary | ICD-10-CM | POA: Diagnosis not present

## 2019-12-16 NOTE — Patient Instructions (Signed)
Thank you allowing the Chronic Care Management Team to be a part of your care! It was a pleasure speaking with you today!     CCM (Chronic Care Management) Team    Noreene Larsson RN, MSN, CCM Nurse Care Coordinator  [phone]   Harlow Asa PharmD  Clinical Pharmacist  [phone]   [name] LCSW Clinical Social Worker [phone]  Visit Information  Goals Addressed            This Visit's Progress    PharmD - Medication review       CARE PLAN ENTRY (see longitudinal plan of care for additional care plan information)  Current Barriers:   Knowledge deficits related to basic COPD self care/management  Knowledge deficit related to basic understanding of how to use inhalers and how inhaled medications work    Limited Social Support  Pharmacist Clinical Goal(s):   Over the next 30 days, patient will work with [employer] Pharmacist to address needs related to medication adherence and medication regimen optimization  Interventions:  Perform chart review   Follow up regarding BP control and monitoring ? Denies checking home BP recently. Encourage patient to restart checking home BP and keep log.  ? Reports has not had any [place] Dew Zero Sugar soda this week (note has reported trying to cut down on caffeine intake). Reports has been drinking some instant tea, but mostly water.  Counsel patient on importance of medication adherence ? Counsel on importance of using maintenance inhaler (Symbicort) and rescue (albuerol) inhalers as directed and rinsing out mouth after use of Symbicort. Patient reports using both inhalers as directed. ? Note patient uses weekly pillbox  Patient asks for resources for help around her home.  ? From review of chart, note that during last call with Jerene Pitch, CCM Social Worker advised patient to follow up with [facility] to find out where she is on their waitlist.  ? Collaborate with CCM Social Worker for phone number for [facility]  program: [phone].  ? Follow up with patient to provide her with phone number and encourage her to call to follow up today  Counsel patient to follow up with provider office for new or worsening medical concerns  Patient Self Care Activities:   Attends all scheduled provider appointments  Calls pharmacy for medication refills  Calls provider office for new concerns or questions  Please see past updates related to this goal by clicking on the "Past Updates" button in the selected goal         Patient verbalizes understanding of instructions provided today.   Telephone follow up appointment with care management team member scheduled for: 10/11 at 11:15 am  Harlow Asa, PharmD, [place] [phone]

## 2019-12-16 NOTE — Chronic Care Management (AMB) (Signed)
Chronic Care Management   Follow Up Note   12/16/2019 Name: Candace Tapia MRN: [id number] DOB: 1936/02/10  Referred by: Olin Hauser, DO Reason for referral : Chronic Care Management (Patient Phone Call)   Candace Tapia is a 84 y.o. year old female who is a primary care patient of Olin Hauser, DO. The CCM team was consulted for assistance with chronic disease management and care coordination needs.    I reached out to [name] by phone today.   Review of patient status, including review of consultants reports, relevant laboratory and other test results, and collaboration with appropriate care team members and the patient's provider was performed as part of comprehensive patient evaluation and provision of chronic care management services.    SDOH (Social Determinants of Health) assessments performed: No See Care Plan activities for detailed interventions related to [facility])     Outpatient Encounter Medications as of 12/16/2019  Medication Sig Note  . acetaminophen (TYLENOL) 650 MG CR tablet Take 650 mg by mouth every 8 (eight) hours as needed for pain.   Marland Kitchen albuterol (PROAIR HFA) 108 (90 BASE) MCG/ACT inhaler Inhale 1 puff into the lungs every 6 (six) hours as needed.    Marland Kitchen amLODipine (NORVASC) 5 MG tablet Take 1 tablet (5 mg total) by mouth daily.   Marland Kitchen aspirin 81 MG chewable tablet Chew 81 mg by mouth daily as needed.  (Patient not taking: Reported on 10/16/2019) 01/01/2019: Takes every once in a while  . budesonide-formoterol (SYMBICORT) 160-4.5 MCG/ACT inhaler Inhale 2 puffs into the lungs 2 (two) times daily.   . chlorthalidone (HYGROTON) 25 MG tablet Take 1 tablet (25 mg total) by mouth daily.   . Cholecalciferol (KP VITAMIN D3) 25 MCG (1000 UT) capsule Vitamin D3 25 mcg (1,000 unit) capsule  1 po qd   . fluticasone (FLONASE) 50 MCG/ACT nasal spray Place 1 spray into both nostrils as needed.    . gabapentin (NEURONTIN) 400 MG capsule TAKE 1  CAPSULE(400 MG) BY MOUTH TWICE DAILY   . losartan (COZAAR) 100 MG tablet Take 1 tablet (100 mg total) by mouth daily.   . meclizine (ANTIVERT) 25 MG tablet Take 0.5 tablets (12.5 mg total) by mouth every 8 (eight) hours as needed for dizziness.   . Multiple Vitamins-Minerals (MULTIVITAMIN WOMEN 50+) TABS Take 1 tablet by mouth daily.    Marland Kitchen NEXIUM 20 MG capsule Take 1 capsule (20 mg total) by mouth daily.   . potassium chloride (KLOR-CON SPRINKLE) 10 MEQ CR capsule Take 1 capsule (10 mEq total) by mouth daily. Open capsule, sprinkle over soft food, swallow whole.   Marland Kitchen Spacer/Aero-Holding Chambers (AEROCHAMBER PLUS) inhaler Use as instructed   . [DISCONTINUED] fexofenadine (ALLEGRA ALLERGY) 180 MG tablet Take 180 mg by mouth as needed. Reported on 09/29/2015    No facility-administered encounter medications on file as of 12/16/2019.    Goals Addressed            This Visit's Progress   . PharmD - Medication review       CARE PLAN ENTRY (see longitudinal plan of care for additional care plan information)  Current Barriers:  Marland Kitchen Knowledge deficits related to basic COPD self care/management . Knowledge deficit related to basic understanding of how to use inhalers and how inhaled medications work   . Limited Social Support  Pharmacist Clinical Goal(s):  Marland Kitchen Over the next 30 days, patient will work with CM Pharmacist to address needs related to medication adherence and medication  regimen optimization  Interventions: . Perform chart review  . Follow up regarding BP control and monitoring ? Denies checking home BP recently. Encourage patient to restart checking home BP and keep log.  ? Reports has not had any [place] Dew Zero Sugar soda this week (note has reported trying to cut down on caffeine intake). Reports has been drinking some instant tea, but mostly water. Marland Kitchen Counsel patient on importance of medication adherence ? Counsel on importance of using maintenance inhaler (Symbicort) and rescue  (albuerol) inhalers as directed and rinsing out mouth after use of Symbicort. Patient reports using both inhalers as directed. ? Note patient uses weekly pillbox . Patient asks for resources for help around her home.  ? From review of chart, note that during last call with Jerene Pitch, CCM Social Worker advised patient to follow up with [facility].H.O[facility]E program to find out where she is on their waitlist.  ? Collaborate with CCM Social Worker for phone number for C.H.O.R.E program: [phone].  ? Follow up with patient to provide her with phone number and encourage her to call to follow up today . Counsel patient to follow up with provider office for new or worsening medical concerns  Patient Self Care Activities:  . Attends all scheduled provider appointments . Calls pharmacy for medication refills . Calls provider office for new concerns or questions  Please see past updates related to this goal by clicking on the "Past Updates" button in the selected goal         Plan  Telephone follow up appointment with care management team member scheduled for: 10/11 at 11:15 am  Harlow Asa, PharmD, [place] [phone]

## 2019-12-17 DIAGNOSIS — I517 Cardiomegaly: Secondary | ICD-10-CM | POA: Diagnosis not present

## 2019-12-17 DIAGNOSIS — J449 Chronic obstructive pulmonary disease, unspecified: Secondary | ICD-10-CM | POA: Diagnosis not present

## 2019-12-17 DIAGNOSIS — R0602 Shortness of breath: Secondary | ICD-10-CM | POA: Diagnosis not present

## 2020-01-09 ENCOUNTER — Ambulatory Visit (INDEPENDENT_AMBULATORY_CARE_PROVIDER_SITE_OTHER): Payer: Medicare Other | Admitting: Licensed Clinical Social Worker

## 2020-01-09 DIAGNOSIS — N182 Chronic kidney disease, stage 2 (mild): Secondary | ICD-10-CM | POA: Diagnosis not present

## 2020-01-09 DIAGNOSIS — I129 Hypertensive chronic kidney disease with stage 1 through stage 4 chronic kidney disease, or unspecified chronic kidney disease: Secondary | ICD-10-CM | POA: Diagnosis not present

## 2020-01-09 DIAGNOSIS — J449 Chronic obstructive pulmonary disease, unspecified: Secondary | ICD-10-CM

## 2020-01-09 DIAGNOSIS — F419 Anxiety disorder, unspecified: Secondary | ICD-10-CM

## 2020-01-09 DIAGNOSIS — G894 Chronic pain syndrome: Secondary | ICD-10-CM

## 2020-01-09 DIAGNOSIS — F3342 Major depressive disorder, recurrent, in full remission: Secondary | ICD-10-CM | POA: Diagnosis not present

## 2020-01-09 NOTE — Chronic Care Management (AMB) (Signed)
Chronic Care Management    Clinical Social Work Follow Up Note  01/09/2020 Name: Candace Tapia MRN: [id number] DOB: [birth year]  Candace Tapia is a 84 y.o. year old female who is a primary care patient of Candace Hauser, DO. The CCM team was consulted for assistance with Mental Health Counseling and Resources.   Review of patient status, including review of consultants reports, other relevant assessments, and collaboration with appropriate care team members and the patient's provider was performed as part of comprehensive patient evaluation and provision of chronic care management services.    SDOH (Social Determinants of Health) assessments performed: Yes    Outpatient Encounter Medications as of 01/09/2020  Medication Sig Note  . acetaminophen (TYLENOL) 650 MG CR tablet Take 650 mg by mouth every 8 (eight) hours as needed for pain.   Marland Kitchen albuterol (PROAIR HFA) 108 (90 BASE) MCG/ACT inhaler Inhale 1 puff into the lungs every 6 (six) hours as needed.    Marland Kitchen amLODipine (NORVASC) 5 MG tablet Take 1 tablet (5 mg total) by mouth daily.   Marland Kitchen aspirin 81 MG chewable tablet Chew 81 mg by mouth daily as needed.  (Patient not taking: Reported on 10/16/2019) 01/01/2019: Takes every once in a while  . budesonide-formoterol (SYMBICORT) 160-4.5 MCG/ACT inhaler Inhale 2 puffs into the lungs 2 (two) times daily.   . chlorthalidone (HYGROTON) 25 MG tablet Take 1 tablet (25 mg total) by mouth daily.   . Cholecalciferol (KP VITAMIN D3) 25 MCG (1000 UT) capsule Vitamin D3 25 mcg (1,000 unit) capsule  1 po qd   . fluticasone (FLONASE) 50 MCG/ACT nasal spray Place 1 spray into both nostrils as needed.    . gabapentin (NEURONTIN) 400 MG capsule TAKE 1 CAPSULE(400 MG) BY MOUTH TWICE DAILY   . losartan (COZAAR) 100 MG tablet Take 1 tablet (100 mg total) by mouth daily.   . meclizine (ANTIVERT) 25 MG tablet Take 0.5 tablets (12.5 mg total) by mouth every 8 (eight) hours as needed for dizziness.   . Multiple  Vitamins-Minerals (MULTIVITAMIN WOMEN 50+) TABS Take 1 tablet by mouth daily.    Marland Kitchen NEXIUM 20 MG capsule Take 1 capsule (20 mg total) by mouth daily.   . potassium chloride (KLOR-CON SPRINKLE) 10 MEQ CR capsule Take 1 capsule (10 mEq total) by mouth daily. Open capsule, sprinkle over soft food, swallow whole.   Marland Kitchen Spacer/Aero-Holding Chambers (AEROCHAMBER PLUS) inhaler Use as instructed   . [DISCONTINUED] fexofenadine (ALLEGRA ALLERGY) 180 MG tablet Take 180 mg by mouth as needed. Reported on 09/29/2015    No facility-administered encounter medications on file as of 01/09/2020.     Goals Addressed    .  "I really extra support at times because I'm alone" (pt-stated)        Current Barriers:  . Financial constraints related to affording in home support or eventual long term care placement . Limited social support . Level of care concerns . ADL IADL limitations . Limited access to caregiver . Inability to perform ADL's independently . Inability to perform IADL's independently . Ongoing depression  Clinical Social Work Clinical Goal(s):  Marland Kitchen Over the next 120 days, patient will work with SW to address concerns related to gaining additional support in the home until patient wishes to pursue LTC placement  Interventions: . Patient interviewed and appropriate assessments performed . Provided patient with information about long term care placement (ALF) and payment sources-out of pocket or Medicaid. LCSW also provided education on personal care service resources  within the area. Patient agreeable to [facility].H.O[facility]R.E referral. . Discussed plans with patient for ongoing care management follow up and provided patient with direct contact information for care management team . Advised patient to consider finding in home support. Patient found an aide that cost $20 per hour but is not able to afford this service. LCSW encouraged patient to network to see if she can find someone at a lesser rate. Candace Tapia  with RN Case Manager re: need for care management services . Patient admits ongoing loneliness and depression. LCSW provided active and reflective listening throughout conversation. Patient shares that she puts on music when she is experiencing symptoms of depression and it helps. She was educated on other coping skills to implement into her daily routine to combat her depression. Patient was receptive to emotional support and healthy self-care education that was provided.  . Assisted patient/caregiver with obtaining information about health plan benefits . Provided education and assistance to client regarding Advanced Directives. . Provided education to patient/caregiver regarding level of care options. Marland Kitchen UPDATE- LCSW successfully coordinated care with Home Care Providers and was able to place patient on the wait list for C.H.O.R.E program on 06/20/19. Patient was advised to contact program to inquire about where she is on the wait list currently. . Patient reports that her daughter in law continues to get her groceries for her. She reports that her family visits her usually once per week on Sundays. . Patient shares that has all of her medications and was prescribed a new medication by Dr. Raul Tapia on 12/17/19 to assist with her SOB. She reports that she "thinks" it is helping but can't be sure now as it is a new medication. . Patient reports that she is having recent difficulty with hearing over the phone even with her hearing aides.  Patient Self Care Activities:  . Self administers medications as prescribed . Attends all scheduled provider appointments . Unable to independently take care of self within the home and is in need of additional personal care service involvement.   Please see past updates related to this goal by clicking on the "Past Updates" button in the selected goal       Follow Up Plan: SW will follow up with patient by phone over the next quarter  Candace Tapia, Candace Tapia, MSW,  [email or web address] Phone: [phone]

## 2020-01-13 ENCOUNTER — Other Ambulatory Visit: Payer: Self-pay | Admitting: Family Medicine

## 2020-01-13 DIAGNOSIS — I10 Essential (primary) hypertension: Secondary | ICD-10-CM

## 2020-01-13 DIAGNOSIS — I129 Hypertensive chronic kidney disease with stage 1 through stage 4 chronic kidney disease, or unspecified chronic kidney disease: Secondary | ICD-10-CM

## 2020-01-21 DIAGNOSIS — J449 Chronic obstructive pulmonary disease, unspecified: Secondary | ICD-10-CM | POA: Diagnosis not present

## 2020-01-27 ENCOUNTER — Ambulatory Visit (INDEPENDENT_AMBULATORY_CARE_PROVIDER_SITE_OTHER): Payer: Medicare Other | Admitting: Pharmacist

## 2020-01-27 DIAGNOSIS — I129 Hypertensive chronic kidney disease with stage 1 through stage 4 chronic kidney disease, or unspecified chronic kidney disease: Secondary | ICD-10-CM | POA: Diagnosis not present

## 2020-01-27 DIAGNOSIS — E782 Mixed hyperlipidemia: Secondary | ICD-10-CM

## 2020-01-27 DIAGNOSIS — J449 Chronic obstructive pulmonary disease, unspecified: Secondary | ICD-10-CM | POA: Diagnosis not present

## 2020-01-27 DIAGNOSIS — N182 Chronic kidney disease, stage 2 (mild): Secondary | ICD-10-CM | POA: Diagnosis not present

## 2020-01-27 DIAGNOSIS — F3342 Major depressive disorder, recurrent, in full remission: Secondary | ICD-10-CM | POA: Diagnosis not present

## 2020-01-27 NOTE — Patient Instructions (Signed)
Thank you allowing the Chronic Care Management Team to be a part of your care! It was a pleasure speaking with you today!     CCM (Chronic Care Management) Team    Noreene Larsson RN, MSN, CCM Nurse Care Coordinator  [phone]   Harlow Asa PharmD  Clinical Pharmacist  [phone]   [name] LCSW Clinical Social Worker [phone]  Visit Information  Goals Addressed            This Visit's Progress    PharmD - Medication review       CARE PLAN ENTRY (see longitudinal plan of care for additional care plan information)  Current Barriers:   Knowledge deficits related to basic COPD self care/management  Knowledge deficit related to basic understanding of how to use inhalers and how inhaled medications work    Limited Social Support  Pharmacist Clinical Goal(s):   Over the next 30 days, patient will work with [employer] Pharmacist to address needs related to medication adherence and medication regimen optimization  Interventions:  Perform chart review. Patient seen by Pulmonologist on 8/31 and advised to: "Continue albuterol, d/c symbicort Start Trelegy one puff q day  Weight loss recommended strongly Nocturnal oxygen at 2 liters Follow up in 18 weeks Return sooner if symptoms relapse"  Counsel patient on importance of medication adherence ? Counsel on importance of using new maintenance inhaler (Trelegy) daily and rescue (albuerol) inhaler as needed and rinsing out mouth after use of Trelegy. Patient reports using both inhalers as directed. ? Remind patient STOP using Symbicort inhaler as directed and recommend patient discard remaining Symbicort inhaler ? Reports needing albterol inhaler less frequently since started Trelegy ? Reports using weekly pillbox  Follow up regarding BP control and monitoring ? Reports latest home BP readings: ? 10/10: 132/78, HR 90 ? 10/9: 126/74, HR 82  Provided empathetic listening as patient discusses her chronic depression  and loneliness. Note patient currently working with CCM Social Worker who has provided self-care education.   Conversation limited by patient having difficulty with her hearing aids today  Counsel patient to follow up with provider office for new or worsening medical concerns  Patient Self Care Activities:   Attends all scheduled provider appointments  Calls pharmacy for medication refills  Calls provider office for new concerns or questions  Please see past updates related to this goal by clicking on the "Past Updates" button in the selected goal         The patient verbalized understanding of instructions provided today and declined a print copy of patient instruction materials.   Telephone follow up appointment with care management team member scheduled for: 11/15 at 12:30 pm  Harlow Asa, PharmD, [place] [phone]

## 2020-01-27 NOTE — Chronic Care Management (AMB) (Signed)
Chronic Care Management   Follow Up Note   01/27/2020 Name: Candace Tapia MRN: [id number] DOB: [birth year]  Referred by: Olin Hauser, DO Reason for referral : Chronic Care Management (Patient Phone Call)   Candace Tapia is a 84 y.o. year old female who is a primary care patient of Olin Hauser, DO. The CCM team was consulted for assistance with chronic disease management and care coordination needs.    I reached out to [name] by phone today.   Review of patient status, including review of consultants reports, relevant laboratory and other test results, and collaboration with appropriate care team members and the patient's provider was performed as part of comprehensive patient evaluation and provision of chronic care management services.    SDOH (Social Determinants of Health) assessments performed: No See Care Plan activities for detailed interventions related to [facility])     Outpatient Encounter Medications as of 01/27/2020  Medication Sig Note  . albuterol (PROAIR HFA) 108 (90 BASE) MCG/ACT inhaler Inhale 1 puff into the lungs every 6 (six) hours as needed.    . Fluticasone-Umeclidin-Vilant (TRELEGY ELLIPTA) 100-62.5-25 MCG/INH AEPB Inhale 1 puff into the lungs daily.   Marland Kitchen acetaminophen (TYLENOL) 650 MG CR tablet Take 650 mg by mouth every 8 (eight) hours as needed for pain.   Marland Kitchen amLODipine (NORVASC) 5 MG tablet TAKE 1 TABLET(5 MG) BY MOUTH DAILY   . aspirin 81 MG chewable tablet Chew 81 mg by mouth daily as needed.  (Patient not taking: Reported on 10/16/2019) 01/01/2019: Takes every once in a while  . budesonide-formoterol (SYMBICORT) 160-4.5 MCG/ACT inhaler Inhale 2 puffs into the lungs 2 (two) times daily.   . chlorthalidone (HYGROTON) 25 MG tablet TAKE 1 TABLET(25 MG) BY MOUTH DAILY   . Cholecalciferol (KP VITAMIN D3) 25 MCG (1000 UT) capsule Vitamin D3 25 mcg (1,000 unit) capsule  1 po qd   . fluticasone (FLONASE) 50 MCG/ACT nasal spray  Place 1 spray into both nostrils as needed.    . gabapentin (NEURONTIN) 400 MG capsule TAKE 1 CAPSULE(400 MG) BY MOUTH TWICE DAILY   . losartan (COZAAR) 100 MG tablet TAKE 1 TABLET(100 MG) BY MOUTH DAILY   . meclizine (ANTIVERT) 25 MG tablet Take 0.5 tablets (12.5 mg total) by mouth every 8 (eight) hours as needed for dizziness.   . Multiple Vitamins-Minerals (MULTIVITAMIN WOMEN 50+) TABS Take 1 tablet by mouth daily.    Marland Kitchen NEXIUM 20 MG capsule Take 1 capsule (20 mg total) by mouth daily.   . potassium chloride (KLOR-CON SPRINKLE) 10 MEQ CR capsule Take 1 capsule (10 mEq total) by mouth daily. Open capsule, sprinkle over soft food, swallow whole.   Marland Kitchen Spacer/Aero-Holding Chambers (AEROCHAMBER PLUS) inhaler Use as instructed   . [DISCONTINUED] fexofenadine (ALLEGRA ALLERGY) 180 MG tablet Take 180 mg by mouth as needed. Reported on 09/29/2015    No facility-administered encounter medications on file as of 01/27/2020.    Goals Addressed            This Visit's Progress   . PharmD - Medication review       CARE PLAN ENTRY (see longitudinal plan of care for additional care plan information)  Current Barriers:  Marland Kitchen Knowledge deficits related to basic COPD self care/management . Knowledge deficit related to basic understanding of how to use inhalers and how inhaled medications work   . Limited Social Support  Pharmacist Clinical Goal(s):  Marland Kitchen Over the next 30 days, patient will work with CM  Pharmacist to address needs related to medication adherence and medication regimen optimization  Interventions: . Perform chart review. Patient seen by Pulmonologist on 8/31 and advised to: "Continue albuterol, d/c symbicort Start Trelegy one puff q day  Weight loss recommended strongly Nocturnal oxygen at 2 liters Follow up in 18 weeks Return sooner if symptoms relapse" . Counsel patient on importance of medication adherence ? Counsel on importance of using new maintenance inhaler (Trelegy) daily and  rescue (albuerol) inhaler as needed and rinsing out mouth after use of Trelegy. Patient reports using both inhalers as directed. ? Remind patient STOP using Symbicort inhaler as directed and recommend patient discard remaining Symbicort inhaler ? Reports needing albterol inhaler less frequently since started Trelegy ? Reports using weekly pillbox . Follow up regarding BP control and monitoring ? Reports latest home BP readings: ? 10/10: 132/78, HR 90 ? 10/9: 126/74, HR 82 . Provided empathetic listening as patient discusses her chronic depression and loneliness. Note patient currently working with CCM Social Worker who has provided self-care education.  Forde Dandy limited by patient having difficulty with her hearing aids today . Counsel patient to follow up with provider office for new or worsening medical concerns  Patient Self Care Activities:  . Attends all scheduled provider appointments . Calls pharmacy for medication refills . Calls provider office for new concerns or questions  Please see past updates related to this goal by clicking on the "Past Updates" button in the selected goal         Plan  Telephone follow up appointment with care management team member scheduled for: 11/15 at 12:30 pm  Harlow Asa, PharmD, [place] [phone]

## 2020-02-06 ENCOUNTER — Telehealth: Payer: Medicare Other | Admitting: General Practice

## 2020-02-06 ENCOUNTER — Ambulatory Visit: Payer: Self-pay | Admitting: General Practice

## 2020-02-06 DIAGNOSIS — F3342 Major depressive disorder, recurrent, in full remission: Secondary | ICD-10-CM | POA: Diagnosis not present

## 2020-02-06 DIAGNOSIS — E782 Mixed hyperlipidemia: Secondary | ICD-10-CM | POA: Diagnosis not present

## 2020-02-06 DIAGNOSIS — F419 Anxiety disorder, unspecified: Secondary | ICD-10-CM

## 2020-02-06 DIAGNOSIS — N182 Chronic kidney disease, stage 2 (mild): Secondary | ICD-10-CM

## 2020-02-06 DIAGNOSIS — G894 Chronic pain syndrome: Secondary | ICD-10-CM

## 2020-02-06 DIAGNOSIS — I129 Hypertensive chronic kidney disease with stage 1 through stage 4 chronic kidney disease, or unspecified chronic kidney disease: Secondary | ICD-10-CM | POA: Diagnosis not present

## 2020-02-06 DIAGNOSIS — J449 Chronic obstructive pulmonary disease, unspecified: Secondary | ICD-10-CM

## 2020-02-06 NOTE — Patient Instructions (Signed)
Visit Information  Goals Addressed              This Visit's Progress   .  RNCM: I do the best I can and get by        Current Barriers:  . Chronic Disease Management support, education, and care coordination needs related to HTN, COPD, Anxiety, and Depression . Social Isolation/Limited support network . Limited Mobility  Clinical Goal(s) related to HTN, COPD, Anxiety, and Depression:  Over the next  120 days, patient will:  . Work with the care management team to address educational, disease management, and care coordination needs  . Begin or continue self health monitoring activities as directed today Measure and record blood pressure 4 times per week . Call provider office for new or worsened signs and symptoms Blood pressure findings outside established parameters, Oxygen saturation lower than established parameter, Shortness of breath, and New or worsened symptom related to Depression and anxiety . Call care management team with questions or concerns . Verbalize basic understanding of patient centered plan of care established today  Interventions related to HTN, COPD, Anxiety, and Depression:  . Evaluation of current treatment plans and patient's adherence to plan as established by provider.  The patient verbalized compliance with her plan of care. The patient does not take statins, review of notes reveal she can not tolerate and does not want to try a lower dose.  The patient does take ASA. Marland Kitchen Assessed patient understanding of disease states.  The patient verbalized understanding of her chronic conditions; however states she is "giddy" and forgetful.  Her biggest issue is social isolation and being by herself a lot. 10-03-2019: Discussed ways to increase social activity. The patient is a "natural loner", but she wants to have companionship now. The patient states she sees her sons about an hour a week. She use to talk to her brothers and sisters on the phone but she is the only living  sibling left. The patient says she does not qualify for Medicaid services but she will check with [facility] insurance about her benefits. She may have to consider moving to a LTC facility or assisted living. She says she has good days and bad days and today is a good day. 02-06-2020: The patient is thankful for the cooler weather. It helps her breathing. Review of the use of her Trelegy.  The patient states she is using it but not everyday. She says it makes her feel tight in the chest.  Recommended talking with pcp about her concerns. She says that she feels it is helping with her breathing but the tightness in her chest makes her feel uneasy.  Review of rinsing mouth out when using and she says she definitely does this.  . Assessed patient's education and care coordination needs.  The patient verbalized sometimes she thinks about going to an assisted living but she is unsure. Is receptive to talking to the care guide about assisted living places in her area. Will place a care guide referral for assistance. 10-03-2019: The patient has talked to the care guides and the LCSW about facilities. The patient is not sure what to do but feels in the future this is something she will have to do. 02-06-2020: the patient is doing well as far as being alone. She does wish she could have an aide come in a couple times a week. Discussed this would likely be an out of pocket expense unless she qualified for medicaid. The patient verbalized she would  not qualify for medicaid. The patient patient has discussed this several times with the RNCM and LCSW. Will continue to monitor for needs and changes.  . Provided disease specific education to patient.  Education on following a Heart Healthy/low carb diet.  The patient states she tries to watch her sodium content and fats and needs to lose some weight. She takes a fluid pill, denies any fluid in her feet and legs.  02-06-2020: The patient endorses a heart healthy diet.  Discussed watching  sugar content as well. The patient is prediabetic. The patient states bother of her parents were diabetic and some of her siblings were. The patient states that she has to be careful with sweets because once they are in her house she likes to eat all of it at one time. Denies any issues with dietary restrictions at this time. States her appetite is good and she is "big as a house".  Education on portion control.  . Evaluation of depression specifically. The patient has a long standing history of this. She states medications make it worse.  Her one friend who she use to do things with is declining in health and memory problems therefore she does not have that anymore. She has not hobbies she likes to do. Talked to the patient about coping skills and possibly having outings with her daughter in law. She states her daughter in law works and this is not always possible.  Discussed audio books but the patient was unsure of this. 10-03-2019: The patient is open to ideas to help with her depression. Hobbies that use to interest her do not now. The patient is open to ideas and admits she is curious about things. She will let the RNCM know ideas to help her with her depression. Also working with LCSW. 02-06-2020: The patient is reading and watching a lot of TV.  The patient was in a good mood today and feels she is doing well right now. Continues to do what she needs to do to maintain her health and well being. States her blood pressures are WNL., she does not take her blood pressure often as she forgets but when she does check her blood pressure it is good. Denies any headaches.  . Evaluation of COVID vaccine. The patient has not had Covid vaccine. Recommended the patient talk to pcp about her concerns.  Nash Dimmer with appropriate clinical care team members regarding patient needs.  Patient is currently involved with LCSW and pharmacist.  . Evaluation of upcoming appointments: PCP appointment on 02-18-2020 at 11:20  am  Patient Self Care Activities related to HTN, COPD, Anxiety, and Depression:  . Patient is unable to independently self-manage chronic health conditions  Please see past updates related to this goal by clicking on the "Past Updates" button in the selected goal      .  RNCM: pt-"I am having a time some days with my arthritis pain" (pt-stated)        CARE PLAN ENTRY (see longitudinal plan of care for additional care plan information)  Current Barriers:  Marland Kitchen Knowledge Deficits related to chronic osteoarthritis pain . Chronic Disease Management support and education needs related to chronic osteoarthritis pain . Lacks caregiver support.   Nurse Case Manager Clinical Goal(s):  Marland Kitchen Over the next 120 days, patient will verbalize understanding of plan for pain management for chronic osteoarthritis  . Over the next 120 days, patient will work with [facility] [place] team and pcp to address needs related to osteoarthritis .  Over the next 120 days, patient will work with CM team pharmacist to assist with medications management and pain medication recommendations  Interventions:  . Inter-disciplinary care team collaboration (see longitudinal plan of care) . Evaluation of current treatment plan related to pain management  and patient's adherence to plan as established by provider.  02-06-2020: The patient states she is still having arthritis pain but her current regimen is working well. She is just getting older and some days are worse than others.  . Advised patient to notify the provider of changes in level or intensity of pain . Provided education to patient re: alternative measures to help with pain management. The patient currently uses tylenol arthritis for pain relief.  . Reviewed medications with patient and discussed compliance. The patient has been working with the pharmacist with compliance and medication management  . Collaborated with CCM team and pcp regarding pain management and patient needs.   02-06-2020: The patient states her pain level is the same. Denies any acute changes in pain. States she has good days and bad days. Today is a good day.  . Discussed plans with patient for ongoing care management follow up and provided patient with direct contact information for care management team . Reviewed scheduled/upcoming provider appointments including: Appointment with pcp on 02-18-2020 at 11:20 am.  Patient Self Care Activities:  . Patient verbalizes understanding of plan to talk to pcp about changes in her pain and recommendations for pain management  . Attends all scheduled provider appointments . Calls provider office for new concerns or questions . Unable to independently manage chronic pain from osteoarthritis   Please see past updates related to this goal by clicking on the "Past Updates" button in the selected goal      .  RNCM: Pt-"I am leary of getting that vaccine" (pt-stated)        CARE PLAN ENTRY (see longtitudinal plan of care for additional care plan information)  Current Barriers:  Marland Kitchen Knowledge Deficits related to COVID-19 and impact on patient self health management  Clinical Goal(s):  Marland Kitchen Over the next 30 days, patient will verbalize basic understanding of COVID-19 impact on individual health and self health management as evidenced by verbalization of basic understanding of COVID-19 as a viral disease, measures to prevent exposure, signs and symptoms, when to contact provider  Interventions: . Provided education to patient to enhance basic understanding of COVID-19 as a viral disease, measures to prevent exposure, signs and symptoms, recommended vaccine schedule, when to contact provider . Provided assistance to patient as requested to obtain appointment for COVID vaccine . Advised patient to discuss her concerns with the pcp and CCM pharmacist and ask for recommendations . Reviewed scheduled/upcoming provider appointments including: 02-28-2020 at 10:40 am with  pcp   Patient Self Care Activities:  . Patient verbalizes understanding of plan to discuss her concerns and reservations about the COVID vaccine with pcp at next visit. . Calls provider office for new concerns or questions  Initial goal documentation                      Patient verbalizes understanding of instructions provided today.   Telephone follow up appointment with care management team member scheduled for: 04-09-2020 at 0900 am  Noreene Larsson RN, MSN, [place] Mobile: [phone]

## 2020-02-06 NOTE — Chronic Care Management (AMB) (Signed)
Chronic Care Management   Follow Up Note   02/06/2020 Name: Candace Tapia MRN: [id number] DOB: June 20, 1935  Referred by: Olin Hauser, DO Reason for referral : Chronic Care Management (RNCM Follow up: Chronic Disease Management and Care Coordination Needs)   Candace Tapia is a 84 y.o. year old female who is a primary care patient of Olin Hauser, DO. The CCM team was consulted for assistance with chronic disease management and care coordination needs.    Review of patient status, including review of consultants reports, relevant laboratory and other test results, and collaboration with appropriate care team members and the patient's provider was performed as part of comprehensive patient evaluation and provision of chronic care management services.    SDOH (Social Determinants of Health) assessments performed: Yes See Care Plan activities for detailed interventions related to [facility])     Outpatient Encounter Medications as of 02/06/2020  Medication Sig Note  . acetaminophen (TYLENOL) 650 MG CR tablet Take 650 mg by mouth every 8 (eight) hours as needed for pain.   Marland Kitchen albuterol (PROAIR HFA) 108 (90 BASE) MCG/ACT inhaler Inhale 1 puff into the lungs every 6 (six) hours as needed.    Marland Kitchen amLODipine (NORVASC) 5 MG tablet TAKE 1 TABLET(5 MG) BY MOUTH DAILY   . aspirin 81 MG chewable tablet Chew 81 mg by mouth daily as needed.  (Patient not taking: Reported on 10/16/2019) 01/01/2019: Takes every once in a while  . budesonide-formoterol (SYMBICORT) 160-4.5 MCG/ACT inhaler Inhale 2 puffs into the lungs 2 (two) times daily.   . chlorthalidone (HYGROTON) 25 MG tablet TAKE 1 TABLET(25 MG) BY MOUTH DAILY   . Cholecalciferol (KP VITAMIN D3) 25 MCG (1000 UT) capsule Vitamin D3 25 mcg (1,000 unit) capsule  1 po qd   . fluticasone (FLONASE) 50 MCG/ACT nasal spray Place 1 spray into both nostrils as needed.    . Fluticasone-Umeclidin-Vilant (TRELEGY ELLIPTA) 100-62.5-25 MCG/INH  AEPB Inhale 1 puff into the lungs daily.   Marland Kitchen gabapentin (NEURONTIN) 400 MG capsule TAKE 1 CAPSULE(400 MG) BY MOUTH TWICE DAILY   . losartan (COZAAR) 100 MG tablet TAKE 1 TABLET(100 MG) BY MOUTH DAILY   . meclizine (ANTIVERT) 25 MG tablet Take 0.5 tablets (12.5 mg total) by mouth every 8 (eight) hours as needed for dizziness.   . Multiple Vitamins-Minerals (MULTIVITAMIN WOMEN 50+) TABS Take 1 tablet by mouth daily.    Marland Kitchen NEXIUM 20 MG capsule Take 1 capsule (20 mg total) by mouth daily.   . potassium chloride (KLOR-CON SPRINKLE) 10 MEQ CR capsule Take 1 capsule (10 mEq total) by mouth daily. Open capsule, sprinkle over soft food, swallow whole.   Marland Kitchen Spacer/Aero-Holding Chambers (AEROCHAMBER PLUS) inhaler Use as instructed   . [DISCONTINUED] fexofenadine (ALLEGRA ALLERGY) 180 MG tablet Take 180 mg by mouth as needed. Reported on 09/29/2015    No facility-administered encounter medications on file as of 02/06/2020.     Objective:  BP Readings from Last 3 Encounters:  10/16/19 (!) 122/56  10/02/19 137/70  08/18/19 119/74    Goals Addressed              This Visit's Progress   .  RNCM: I do the best I can and get by        Current Barriers:  . Chronic Disease Management support, education, and care coordination needs related to HTN, COPD, Anxiety, and Depression . Social Isolation/Limited support network . Limited Mobility  Clinical Goal(s) related to HTN, COPD, Anxiety, and Depression:  Over the next  120 days, patient will:  . Work with the care management team to address educational, disease management, and care coordination needs  . Begin or continue self health monitoring activities as directed today Measure and record blood pressure 4 times per week . Call provider office for new or worsened signs and symptoms Blood pressure findings outside established parameters, Oxygen saturation lower than established parameter, Shortness of breath, and New or worsened symptom related to  Depression and anxiety . Call care management team with questions or concerns . Verbalize basic understanding of patient centered plan of care established today  Interventions related to HTN, COPD, Anxiety, and Depression:  . Evaluation of current treatment plans and patient's adherence to plan as established by provider.  The patient verbalized compliance with her plan of care. The patient does not take statins, review of notes reveal she can not tolerate and does not want to try a lower dose.  The patient does take ASA. Marland Kitchen Assessed patient understanding of disease states.  The patient verbalized understanding of her chronic conditions; however states she is "giddy" and forgetful.  Her biggest issue is social isolation and being by herself a lot. 10-03-2019: Discussed ways to increase social activity. The patient is a "natural loner", but she wants to have companionship now. The patient states she sees her sons about an hour a week. She use to talk to her brothers and sisters on the phone but she is the only living sibling left. The patient says she does not qualify for Medicaid services but she will check with [facility] insurance about her benefits. She may have to consider moving to a LTC facility or assisted living. She says she has good days and bad days and today is a good day. 02-06-2020: The patient is thankful for the cooler weather. It helps her breathing. Review of the use of her Trelegy.  The patient states she is using it but not everyday. She says it makes her feel tight in the chest.  Recommended talking with pcp about her concerns. She says that she feels it is helping with her breathing but the tightness in her chest makes her feel uneasy.  Review of rinsing mouth out when using and she says she definitely does this.  . Assessed patient's education and care coordination needs.  The patient verbalized sometimes she thinks about going to an assisted living but she is unsure. Is receptive to talking to  the care guide about assisted living places in her area. Will place a care guide referral for assistance. 10-03-2019: The patient has talked to the care guides and the LCSW about facilities. The patient is not sure what to do but feels in the future this is something she will have to do. 02-06-2020: the patient is doing well as far as being alone. She does wish she could have an aide come in a couple times a week. Discussed this would likely be an out of pocket expense unless she qualified for medicaid. The patient verbalized she would not qualify for medicaid. The patient patient has discussed this several times with the RNCM and LCSW. Will continue to monitor for needs and changes.  . Provided disease specific education to patient.  Education on following a Heart Healthy/low carb diet.  The patient states she tries to watch her sodium content and fats and needs to lose some weight. She takes a fluid pill, denies any fluid in her feet and legs.  02-06-2020: The patient endorses a  heart healthy diet.  Discussed watching sugar content as well. The patient is prediabetic. The patient states bother of her parents were diabetic and some of her siblings were. The patient states that she has to be careful with sweets because once they are in her house she likes to eat all of it at one time. Denies any issues with dietary restrictions at this time. States her appetite is good and she is "big as a house".  Education on portion control.  . Evaluation of depression specifically. The patient has a long standing history of this. She states medications make it worse.  Her one friend who she use to do things with is declining in health and memory problems therefore she does not have that anymore. She has not hobbies she likes to do. Talked to the patient about coping skills and possibly having outings with her daughter in law. She states her daughter in law works and this is not always possible.  Discussed audio books but the  patient was unsure of this. 10-03-2019: The patient is open to ideas to help with her depression. Hobbies that use to interest her do not now. The patient is open to ideas and admits she is curious about things. She will let the RNCM know ideas to help her with her depression. Also working with LCSW. 02-06-2020: The patient is reading and watching a lot of TV.  The patient was in a good mood today and feels she is doing well right now. Continues to do what she needs to do to maintain her health and well being. States her blood pressures are WNL., she does not take her blood pressure often as she forgets but when she does check her blood pressure it is good. Denies any headaches.  . Evaluation of COVID vaccine. The patient has not had Covid vaccine. Recommended the patient talk to pcp about her concerns.  Nash Dimmer with appropriate clinical care team members regarding patient needs.  Patient is currently involved with LCSW and pharmacist.  . Evaluation of upcoming appointments: PCP appointment on 02-18-2020 at 11:20 am  Patient Self Care Activities related to HTN, COPD, Anxiety, and Depression:  . Patient is unable to independently self-manage chronic health conditions  Please see past updates related to this goal by clicking on the "Past Updates" button in the selected goal      .  RNCM: pt-"I am having a time some days with my arthritis pain" (pt-stated)        CARE PLAN ENTRY (see longitudinal plan of care for additional care plan information)  Current Barriers:  Marland Kitchen Knowledge Deficits related to chronic osteoarthritis pain . Chronic Disease Management support and education needs related to chronic osteoarthritis pain . Lacks caregiver support.   Nurse Case Manager Clinical Goal(s):  Marland Kitchen Over the next 120 days, patient will verbalize understanding of plan for pain management for chronic osteoarthritis  . Over the next 120 days, patient will work with [facility] [place] team and pcp to address needs  related to osteoarthritis . Over the next 120 days, patient will work with CM team pharmacist to assist with medications management and pain medication recommendations  Interventions:  . Inter-disciplinary care team collaboration (see longitudinal plan of care) . Evaluation of current treatment plan related to pain management  and patient's adherence to plan as established by provider.  02-06-2020: The patient states she is still having arthritis pain but her current regimen is working well. She is just getting older and some  days are worse than others.  . Advised patient to notify the provider of changes in level or intensity of pain . Provided education to patient re: alternative measures to help with pain management. The patient currently uses tylenol arthritis for pain relief.  . Reviewed medications with patient and discussed compliance. The patient has been working with the pharmacist with compliance and medication management  . Collaborated with CCM team and pcp regarding pain management and patient needs.  02-06-2020: The patient states her pain level is the same. Denies any acute changes in pain. States she has good days and bad days. Today is a good day.  . Discussed plans with patient for ongoing care management follow up and provided patient with direct contact information for care management team . Reviewed scheduled/upcoming provider appointments including: Appointment with pcp on 02-18-2020 at 11:20 am.  Patient Self Care Activities:  . Patient verbalizes understanding of plan to talk to pcp about changes in her pain and recommendations for pain management  . Attends all scheduled provider appointments . Calls provider office for new concerns or questions . Unable to independently manage chronic pain from osteoarthritis   Please see past updates related to this goal by clicking on the "Past Updates" button in the selected goal      .  RNCM: Pt-"I am leary of getting that vaccine"  (pt-stated)        CARE PLAN ENTRY (see longtitudinal plan of care for additional care plan information)  Current Barriers:  Marland Kitchen Knowledge Deficits related to COVID-19 and impact on patient self health management  Clinical Goal(s):  Marland Kitchen Over the next 30 days, patient will verbalize basic understanding of COVID-19 impact on individual health and self health management as evidenced by verbalization of basic understanding of COVID-19 as a viral disease, measures to prevent exposure, signs and symptoms, when to contact provider  Interventions: . Provided education to patient to enhance basic understanding of COVID-19 as a viral disease, measures to prevent exposure, signs and symptoms, recommended vaccine schedule, when to contact provider . Provided assistance to patient as requested to obtain appointment for COVID vaccine . Advised patient to discuss her concerns with the pcp and CCM pharmacist and ask for recommendations . Reviewed scheduled/upcoming provider appointments including: 02-28-2020 at 10:40 am with pcp   Patient Self Care Activities:  . Patient verbalizes understanding of plan to discuss her concerns and reservations about the COVID vaccine with pcp at next visit. . Calls provider office for new concerns or questions  Initial goal documentation                       Plan:   Telephone follow up appointment with care management team member scheduled for: 04-09-2020 at 0900 am   Noreene Larsson RN, MSN, [place] Mobile: [phone]

## 2020-02-15 ENCOUNTER — Other Ambulatory Visit: Payer: Self-pay | Admitting: Family Medicine

## 2020-02-15 DIAGNOSIS — G629 Polyneuropathy, unspecified: Secondary | ICD-10-CM

## 2020-02-17 ENCOUNTER — Other Ambulatory Visit: Payer: Medicare Other

## 2020-02-18 ENCOUNTER — Ambulatory Visit: Payer: Medicare Other | Admitting: Family Medicine

## 2020-02-20 ENCOUNTER — Other Ambulatory Visit: Payer: Self-pay | Admitting: *Deleted

## 2020-02-20 DIAGNOSIS — E876 Hypokalemia: Secondary | ICD-10-CM

## 2020-02-20 DIAGNOSIS — R7309 Other abnormal glucose: Secondary | ICD-10-CM

## 2020-02-20 DIAGNOSIS — F3342 Major depressive disorder, recurrent, in full remission: Secondary | ICD-10-CM

## 2020-02-20 DIAGNOSIS — Z0000 Encounter for general adult medical examination without abnormal findings: Secondary | ICD-10-CM

## 2020-02-20 DIAGNOSIS — E559 Vitamin D deficiency, unspecified: Secondary | ICD-10-CM

## 2020-02-20 DIAGNOSIS — E782 Mixed hyperlipidemia: Secondary | ICD-10-CM

## 2020-02-20 DIAGNOSIS — I129 Hypertensive chronic kidney disease with stage 1 through stage 4 chronic kidney disease, or unspecified chronic kidney disease: Secondary | ICD-10-CM

## 2020-02-20 DIAGNOSIS — F419 Anxiety disorder, unspecified: Secondary | ICD-10-CM

## 2020-02-21 ENCOUNTER — Other Ambulatory Visit: Payer: Self-pay

## 2020-02-21 ENCOUNTER — Other Ambulatory Visit: Payer: Medicare Other

## 2020-02-21 DIAGNOSIS — E559 Vitamin D deficiency, unspecified: Secondary | ICD-10-CM | POA: Diagnosis not present

## 2020-02-21 DIAGNOSIS — J449 Chronic obstructive pulmonary disease, unspecified: Secondary | ICD-10-CM | POA: Diagnosis not present

## 2020-02-21 DIAGNOSIS — E782 Mixed hyperlipidemia: Secondary | ICD-10-CM | POA: Diagnosis not present

## 2020-02-21 DIAGNOSIS — I129 Hypertensive chronic kidney disease with stage 1 through stage 4 chronic kidney disease, or unspecified chronic kidney disease: Secondary | ICD-10-CM | POA: Diagnosis not present

## 2020-02-21 DIAGNOSIS — R7309 Other abnormal glucose: Secondary | ICD-10-CM | POA: Diagnosis not present

## 2020-02-22 LAB — COMPLETE METABOLIC PANEL WITH GFR
AG Ratio: 1.9 (calc) (ref 1.0–2.5)
ALT: 20 U/L (ref 6–29)
AST: 18 U/L (ref 10–35)
Albumin: 4.3 g/dL (ref 3.6–5.1)
Alkaline phosphatase (APISO): 72 U/L (ref 37–153)
BUN: 15 mg/dL (ref 7–25)
CO2: 26 mmol/L (ref 20–32)
Calcium: 9.7 mg/dL (ref 8.6–10.4)
Chloride: 98 mmol/L (ref 98–110)
Creat: 0.8 mg/dL (ref 0.60–0.88)
GFR, Est African American: 80 mL/min/{1.73_m2} (ref >=60)
GFR, Est Non African American: 69 mL/min/{1.73_m2} (ref >=60)
Globulin: 2.3 g/dL (calc) (ref 1.9–3.7)
Glucose, Bld: 106 mg/dL — ABNORMAL HIGH (ref 65–99)
Potassium: 3.6 mmol/L (ref 3.5–5.3)
Sodium: 137 mmol/L (ref 135–146)
Total Bilirubin: 0.5 mg/dL (ref 0.2–1.2)
Total Protein: 6.6 g/dL (ref 6.1–8.1)

## 2020-02-22 LAB — LIPID PANEL
Cholesterol: 187 mg/dL (ref <200)
HDL: 44 mg/dL — ABNORMAL LOW (ref >=50)
LDL Cholesterol (Calc): 108 mg/dL (calc) — ABNORMAL HIGH
Non-HDL Cholesterol (Calc): 143 mg/dL (calc) — ABNORMAL HIGH (ref <130)
Total CHOL/HDL Ratio: 4.3 (calc) (ref <5.0)
Triglycerides: 232 mg/dL — ABNORMAL HIGH (ref <150)

## 2020-02-22 LAB — CBC WITH DIFFERENTIAL/PLATELET
Absolute Monocytes: 567 cells/uL (ref 200–950)
Basophils Absolute: 41 cells/uL (ref 0–200)
Basophils Relative: 0.5 %
Eosinophils Absolute: 113 cells/uL (ref 15–500)
Eosinophils Relative: 1.4 %
HCT: 39.8 % (ref 35.0–45.0)
Hemoglobin: 13.2 g/dL (ref 11.7–15.5)
Lymphs Abs: 1458 cells/uL (ref 850–3900)
MCH: 29.6 pg (ref 27.0–33.0)
MCHC: 33.2 g/dL (ref 32.0–36.0)
MCV: 89.2 fL (ref 80.0–100.0)
MPV: 10.9 fL (ref 7.5–12.5)
Monocytes Relative: 7 %
Neutro Abs: 5921 cells/uL (ref 1500–7800)
Neutrophils Relative %: 73.1 %
Platelets: 294 10*3/uL (ref 140–400)
RBC: 4.46 10*6/uL (ref 3.80–5.10)
RDW: 13.1 % (ref 11.0–15.0)
Total Lymphocyte: 18 %
WBC: 8.1 10*3/uL (ref 3.8–10.8)

## 2020-02-22 LAB — HEMOGLOBIN A1C
Hgb A1c MFr Bld: 5.6 % of total Hgb (ref <5.7)
Mean Plasma Glucose: 114 (calc)
eAG (mmol/L): 6.3 (calc)

## 2020-02-22 LAB — VITAMIN D 25 HYDROXY (VIT D DEFICIENCY, FRACTURES): Vit D, 25-Hydroxy: 26 ng/mL — ABNORMAL LOW (ref 30–100)

## 2020-02-22 LAB — TSH: TSH: 3.51 mIU/L (ref 0.40–4.50)

## 2020-02-28 ENCOUNTER — Ambulatory Visit: Payer: Medicare Other | Admitting: Family Medicine

## 2020-03-02 ENCOUNTER — Ambulatory Visit: Payer: Medicare Other | Admitting: Pharmacist

## 2020-03-02 ENCOUNTER — Ambulatory Visit (INDEPENDENT_AMBULATORY_CARE_PROVIDER_SITE_OTHER): Payer: Medicare Other | Admitting: Family Medicine

## 2020-03-02 ENCOUNTER — Other Ambulatory Visit: Payer: Self-pay

## 2020-03-02 ENCOUNTER — Encounter: Payer: Self-pay | Admitting: Family Medicine

## 2020-03-02 VITALS — BP 127/52 | HR 86 | Temp 98.4°F | Resp 16 | Ht 65.5 in | Wt 220.0 lb

## 2020-03-02 DIAGNOSIS — E559 Vitamin D deficiency, unspecified: Secondary | ICD-10-CM

## 2020-03-02 DIAGNOSIS — I129 Hypertensive chronic kidney disease with stage 1 through stage 4 chronic kidney disease, or unspecified chronic kidney disease: Secondary | ICD-10-CM

## 2020-03-02 DIAGNOSIS — Z0000 Encounter for general adult medical examination without abnormal findings: Secondary | ICD-10-CM | POA: Diagnosis not present

## 2020-03-02 DIAGNOSIS — J432 Centrilobular emphysema: Secondary | ICD-10-CM

## 2020-03-02 DIAGNOSIS — G720 Drug-induced myopathy: Secondary | ICD-10-CM

## 2020-03-02 DIAGNOSIS — R7303 Prediabetes: Secondary | ICD-10-CM

## 2020-03-02 DIAGNOSIS — E782 Mixed hyperlipidemia: Secondary | ICD-10-CM

## 2020-03-02 DIAGNOSIS — E669 Obesity, unspecified: Secondary | ICD-10-CM

## 2020-03-02 DIAGNOSIS — J449 Chronic obstructive pulmonary disease, unspecified: Secondary | ICD-10-CM

## 2020-03-02 DIAGNOSIS — Z23 Encounter for immunization: Secondary | ICD-10-CM | POA: Diagnosis not present

## 2020-03-02 DIAGNOSIS — F3342 Major depressive disorder, recurrent, in full remission: Secondary | ICD-10-CM

## 2020-03-02 DIAGNOSIS — N182 Chronic kidney disease, stage 2 (mild): Secondary | ICD-10-CM

## 2020-03-02 NOTE — Patient Instructions (Addendum)
Thank you for coming to the office today.  Consider COVID vaccine if you are ready.  Fish Oil Omega 3 1,000 mg capsule - take 1-2 capsules with meal twice a day. Can help the joints as well  Recent Labs    07/10/19 1437 10/16/19 1419 02/21/20 0831  HGBA1C 5.7* 5.6 5.6   Flu shot today   DUE for FASTING BLOOD WORK (no food or drink after midnight before the lab appointment, only water or coffee without cream/sugar on the morning of)  SCHEDULE "Lab Only" visit in the morning at the clinic for lab draw in 6 MONTHS   - Make sure Lab Only appointment is at about 1 week before your next appointment, so that results will be available  For Lab Results, once available within 2-3 days of blood draw, you can can log in to MyChart online to view your results and a brief explanation. Also, we can discuss results at next follow-up visit.    Please schedule a Follow-up Appointment to: Return in about 6 months (around 08/30/2020) for 6 month follow-up PreDM A1c, HTN, Arthritis.  If you have any other questions or concerns, please feel free to call the office or send a message through [place]. You may also schedule an earlier appointment if necessary.  Additionally, you may be receiving a survey about your experience at our office within a few days to 1 week by e-mail or mail. We value your feedback.  Nobie Putnam, DO [place]

## 2020-03-02 NOTE — Chronic Care Management (AMB) (Signed)
Chronic Care Management   Follow Up Note   03/02/2020 Name: Candace Tapia MRN: [id number] DOB: November 25, 1935  Referred by: Olin Hauser, DO Reason for referral : Chronic Care Management (Patient Phone Call)   Candace Tapia is a 84 y.o. year old female who is a primary care patient of Olin Hauser, DO. The CCM team was consulted for assistance with chronic disease management and care coordination needs.    I reached out to [name] by phone today.   Review of patient status, including review of consultants reports, relevant laboratory and other test results, and collaboration with appropriate care team members and the patient's provider was performed as part of comprehensive patient evaluation and provision of chronic care management services.    SDOH (Social Determinants of Health) assessments performed: No See Care Plan activities for detailed interventions related to [facility])     Outpatient Encounter Medications as of 03/02/2020  Medication Sig Note  . acetaminophen (TYLENOL) 650 MG CR tablet Take 650 mg by mouth every 8 (eight) hours as needed for pain.   Marland Kitchen albuterol (PROAIR HFA) 108 (90 BASE) MCG/ACT inhaler Inhale 1 puff into the lungs every 6 (six) hours as needed.    Marland Kitchen amLODipine (NORVASC) 5 MG tablet TAKE 1 TABLET(5 MG) BY MOUTH DAILY   . aspirin 81 MG chewable tablet Chew 81 mg by mouth daily as needed.  (Patient not taking: Reported on 10/16/2019) 01/01/2019: Takes every once in a while  . budesonide-formoterol (SYMBICORT) 160-4.5 MCG/ACT inhaler Inhale 2 puffs into the lungs 2 (two) times daily.   . chlorthalidone (HYGROTON) 25 MG tablet TAKE 1 TABLET(25 MG) BY MOUTH DAILY   . Cholecalciferol (KP VITAMIN D3) 25 MCG (1000 UT) capsule Vitamin D3 25 mcg (1,000 unit) capsule  1 po qd   . fluticasone (FLONASE) 50 MCG/ACT nasal spray Place 1 spray into both nostrils as needed.    . Fluticasone-Umeclidin-Vilant (TRELEGY ELLIPTA) 100-62.5-25 MCG/INH  AEPB Inhale 1 puff into the lungs daily.   Marland Kitchen gabapentin (NEURONTIN) 400 MG capsule TAKE 1 CAPSULE(400 MG) BY MOUTH TWICE DAILY   . losartan (COZAAR) 100 MG tablet TAKE 1 TABLET(100 MG) BY MOUTH DAILY   . meclizine (ANTIVERT) 25 MG tablet Take 0.5 tablets (12.5 mg total) by mouth every 8 (eight) hours as needed for dizziness.   . Multiple Vitamins-Minerals (MULTIVITAMIN WOMEN 50+) TABS Take 1 tablet by mouth daily.    Marland Kitchen NEXIUM 20 MG capsule Take 1 capsule (20 mg total) by mouth daily.   . potassium chloride (KLOR-CON SPRINKLE) 10 MEQ CR capsule Take 1 capsule (10 mEq total) by mouth daily. Open capsule, sprinkle over soft food, swallow whole.   Marland Kitchen Spacer/Aero-Holding Chambers (AEROCHAMBER PLUS) inhaler Use as instructed   . [DISCONTINUED] fexofenadine (ALLEGRA ALLERGY) 180 MG tablet Take 180 mg by mouth as needed. Reported on 09/29/2015    No facility-administered encounter medications on file as of 03/02/2020.    Goals Addressed            This Visit's Progress   . PharmD - Medication review       CARE PLAN ENTRY (see longitudinal plan of care for additional care plan information)  Current Barriers:  Marland Kitchen Knowledge deficits related to basic COPD self care/management . Knowledge deficit related to basic understanding of how to use inhalers and how inhaled medications work   . Limited Social Support  Pharmacist Clinical Goal(s):  Marland Kitchen Over the next 30 days, patient will work with CM  Pharmacist to address needs related to medication adherence and medication regimen optimization  Interventions: . Counsel patient on importance of medication adherence ? Again counsel on importance of using maintenance inhaler (Trelegy) daily as directed and rescue (albuerol) inhaler as needed and rinsing out mouth after use of Trelegy.  ? Reports using weekly pillbox . Follow up regarding BP control and monitoring ? Denies monitoring home BP recently ? Encourage patient to restart checking home BP, keeping log  and bring log with her to medical appointments ? Note patient has appointment with PCP today ? Patient reports difficulty with remembering to check BP at home despite keeping monitor on central table in home ? Encourage patient to add monitoring to routine to help her remember ? Encourage patient to discuss concerns about memory with PCP . Coordination of care: Vaccinations ? Encourage patient to receive COVID-19 vaccination ? Reports planning to receive annual flu vaccine at appointment today . Patient denies medication questions/concerns today  Patient Self Care Activities:  . Attends all scheduled provider appointments . Calls pharmacy for medication refills . Calls provider office for new concerns or questions  Please see past updates related to this goal by clicking on the "Past Updates" button in the selected goal         Plan  The care management team will reach out to the patient again over the next 60 days.   Harlow Asa, PharmD, [place] [employer] [phone]

## 2020-03-02 NOTE — Patient Instructions (Signed)
Thank you allowing the Chronic Care Management Team to be a part of your care! It was a pleasure speaking with you today!     CCM (Chronic Care Management) Team    Noreene Larsson RN, MSN, CCM Nurse Care Coordinator  [phone]   Harlow Asa PharmD  Clinical Pharmacist  [phone]   [name] LCSW Clinical Social Worker [phone]  Visit Information  Goals Addressed            This Visit's Progress    PharmD - Medication review       CARE PLAN ENTRY (see longitudinal plan of care for additional care plan information)  Current Barriers:   Knowledge deficits related to basic COPD self care/management  Knowledge deficit related to basic understanding of how to use inhalers and how inhaled medications work    Limited Social Support  Pharmacist Clinical Goal(s):   Over the next 30 days, patient will work with [employer] Pharmacist to address needs related to medication adherence and medication regimen optimization  Interventions:  Counsel patient on importance of medication adherence ? Again counsel on importance of using maintenance inhaler (Trelegy) daily as directed and rescue (albuerol) inhaler as needed and rinsing out mouth after use of Trelegy.  ? Reports using weekly pillbox  Follow up regarding BP control and monitoring ? Denies monitoring home BP recently ? Encourage patient to restart checking home BP, keeping log and bring log with her to medical appointments ? Note patient has appointment with PCP today ? Patient reports difficulty with remembering to check BP at home despite keeping monitor on central table in home ? Encourage patient to add monitoring to routine to help her remember ? Encourage patient to discuss concerns about memory with PCP  Coordination of care: Vaccinations ? Encourage patient to receive COVID-19 vaccination ? Reports planning to receive annual flu vaccine at appointment today  Patient denies medication questions/concerns  today  Patient Self Care Activities:   Attends all scheduled provider appointments  Calls pharmacy for medication refills  Calls provider office for new concerns or questions  Please see past updates related to this goal by clicking on the "Past Updates" button in the selected goal         The patient verbalized understanding of instructions, educational materials, and care plan provided today and declined offer to receive copy of patient instructions, educational materials, and care plan.   The care management team will reach out to the patient again over the next 60 days.   Harlow Asa, PharmD, [place] [employer] [phone]

## 2020-03-02 NOTE — Progress Notes (Signed)
Subjective:    Patient ID: Candace Tapia, female    DOB: 01/31/1936, 84 y.o.   MRN: [id number]  Candace Tapia is a 84 y.o. female presenting on 03/02/2020 for Annual Exam   HPI   Daughter, Candace Tapia is here for history  Here for Annual Physical  Pre-Diabetes/ obesity BMI >36 Reports no concerns. Last A1ctrend 5.7 down to 5.6 Meds:Never on meds Limited diet and exercise, admits difficulty exercising due to back hip pain arthritis, and often still eating sweets at time Denies hypoglycemia, polyuria, visual changes, numbness or tingling.  CHRONIC HTN/ Peripheral Edema / Hypokalemia Improved BP control and edema Current Meds -Losartan 100mg  daily, Chlorthalidone 25mg  daily, Amlodipine 5mg  daily Reports good compliance, took meds today. Taking Potassium41mEq daily now  Admits chronic problem weak and swelling in legs arthritis  HYPERLIPIDEMIA: - Reports concerns she cannot take cholesterol statin medicine due to myopathy  Last lipid panel 02/2020, improved LDL to 108 prior 110s-130  Vitamin D Last lab result 26, taking OTC vitamin D3 1,000 most days.  Chronic Vestibular disorder Followed by [facility] She has had some dizziness episodes lightheadedness  Recurrent Depression, - in remission Reports she has prior history of some depressed mood but overall has managed to cope with it for long time and currently doing well. She says she is alone. She has limited family members around. She has some friends, one with alzheimer's dementia. Cannot tolerate medication, remains off  Centrilobular Emphysema(COPD) Followed by Dr Minerva Areola Pulm On oxygen at night, nocturnal up to 2L Using inhalersContinueSymbicort 160/4.5 two puffs bid, albuterolPRN She is doing well but often can get short of breath with exertion, worse with mask  Health Maintenance:  Due for high dose Flu Shot today  Interested in [place] but not sure.   Depression screen [facility]  2/9 03/02/2020 10/16/2019 07/10/2019  Decreased Interest 0 0 0  Down, Depressed, Hopeless 0 0 0  PHQ - 2 Score 0 0 0  Altered sleeping 0 0 0  Tired, decreased energy 0 1 0  Change in appetite 0 0 0  Feeling bad or failure about yourself  - 0 0  Trouble concentrating 0 0 0  Moving slowly or fidgety/restless 0 0 0  Suicidal thoughts 0 0 0  PHQ-9 Score 0 1 0  Difficult doing work/chores Not difficult at all Not difficult at all Not difficult at all  Some recent data might be hidden    Past Medical History:  Diagnosis Date  . Allergy   . Arthritis   . Breast cancer ([place]) 2014   radiation and taking exemestane  . Cancer of left breast ([place]) 10/27/2014  . GERD (gastroesophageal reflux disease)   . H/O: hysterectomy   . History of lumpectomy 2014   LT breast   Past Surgical History:  Procedure Laterality Date  . ABDOMINAL HYSTERECTOMY    . BREAST LUMPECTOMY Left 2014  . BREAST SURGERY Left 2014  . JOINT REPLACEMENT    . LYMPH NODE DISSECTION     neck  . TOTAL SHOULDER REPLACEMENT Bilateral 2010 and 2013   Social History   Socioeconomic History  . Marital status: Widowed    Spouse name: Not on file  . Number of children: Not on file  . Years of education: Not on file  . Highest education level: 8th grade  Occupational History  . Not on file  Tobacco Use  . Smoking status: Former Smoker    Packs/day: 2.00    Years:  40.00    Pack years: 80.00    Quit date: 04/19/1993    Years since quitting: 26.8  . Smokeless tobacco: Never Used  Vaping Use  . Vaping Use: Never assessed  Substance and Sexual Activity  . Alcohol use: No    Alcohol/week: 0.0 standard drinks  . Drug use: No  . Sexual activity: Never  Other Topics Concern  . Not on file  Social History Narrative  . Not on file   Social Determinants of Health   Financial Resource Strain: Low Risk   . Difficulty of Paying Living Expenses: Not hard at all  Food Insecurity: No Food Insecurity  . Worried About Paediatric nurse in the Last Year: Never true  . Ran Out of Food in the Last Year: Never true  Transportation Needs: No Transportation Needs  . Lack of Transportation (Medical): No  . Lack of Transportation (Non-Medical): No  Physical Activity: Inactive  . Days of Exercise per Week: 0 days  . Minutes of Exercise per Session: 0 min  Stress: No Stress Concern Present  . Feeling of Stress : Not at all  Social Connections: Socially Isolated  . Frequency of Communication with Friends and Family: Once a week  . Frequency of Social Gatherings with Friends and Family: More than three times a week  . Attends Religious Services: Never  . Active Member of Clubs or Organizations: No  . Attends Archivist Meetings: Never  . Marital Status: Widowed  Intimate Partner Violence: Not At Risk  . Fear of Current or Ex-Partner: No  . Emotionally Abused: No  . Physically Abused: No  . Sexually Abused: No   Family History  Problem Relation Age of Onset  . Breast cancer Mother 12  . Diabetes Mother   . Kidney disease Father   . Diabetes Father   . Breast cancer Sister 21   Current Outpatient Medications on File Prior to Visit  Medication Sig  . acetaminophen (TYLENOL) 650 MG CR tablet Take 650 mg by mouth every 8 (eight) hours as needed for pain.  Marland Kitchen albuterol (PROAIR HFA) 108 (90 BASE) MCG/ACT inhaler Inhale 1 puff into the lungs every 6 (six) hours as needed.   Marland Kitchen amLODipine (NORVASC) 5 MG tablet TAKE 1 TABLET(5 MG) BY MOUTH DAILY  . aspirin 81 MG chewable tablet Chew 81 mg by mouth daily as needed.   . budesonide-formoterol (SYMBICORT) 160-4.5 MCG/ACT inhaler Inhale 2 puffs into the lungs 2 (two) times daily.  . chlorthalidone (HYGROTON) 25 MG tablet TAKE 1 TABLET(25 MG) BY MOUTH DAILY  . Cholecalciferol (KP VITAMIN D3) 25 MCG (1000 UT) capsule Vitamin D3 25 mcg (1,000 unit) capsule  1 po qd  . fluticasone (FLONASE) 50 MCG/ACT nasal spray Place 1 spray into both nostrils as needed.   .  Fluticasone-Umeclidin-Vilant (TRELEGY ELLIPTA) 100-62.5-25 MCG/INH AEPB Inhale 1 puff into the lungs daily.  Marland Kitchen gabapentin (NEURONTIN) 400 MG capsule TAKE 1 CAPSULE(400 MG) BY MOUTH TWICE DAILY  . losartan (COZAAR) 100 MG tablet TAKE 1 TABLET(100 MG) BY MOUTH DAILY  . meclizine (ANTIVERT) 25 MG tablet Take 0.5 tablets (12.5 mg total) by mouth every 8 (eight) hours as needed for dizziness.  . Multiple Vitamins-Minerals (MULTIVITAMIN WOMEN 50+) TABS Take 1 tablet by mouth daily.   Marland Kitchen NEXIUM 20 MG capsule Take 1 capsule (20 mg total) by mouth daily.  . potassium chloride (KLOR-CON SPRINKLE) 10 MEQ CR capsule Take 1 capsule (10 mEq total) by mouth daily. Open capsule,  sprinkle over soft food, swallow whole.  Marland Kitchen Spacer/Aero-Holding Chambers (AEROCHAMBER PLUS) inhaler Use as instructed  . [DISCONTINUED] fexofenadine (ALLEGRA ALLERGY) 180 MG tablet Take 180 mg by mouth as needed. Reported on 09/29/2015   No current facility-administered medications on file prior to visit.    Review of Systems  Constitutional: Negative for activity change, appetite change, chills, diaphoresis, fatigue and fever.  HENT: Negative for congestion and hearing loss.   Eyes: Negative for visual disturbance.  Respiratory: Negative for cough, chest tightness, shortness of breath and wheezing.   Cardiovascular: Negative for chest pain, palpitations and leg swelling.  Gastrointestinal: Negative for abdominal pain, constipation, diarrhea, nausea and vomiting.  Genitourinary: Negative for dysuria, frequency and hematuria.  Musculoskeletal: Negative for arthralgias and neck pain.  Skin: Negative for rash.  Allergic/Immunologic: Negative for environmental allergies.  Neurological: Negative for dizziness, weakness, light-headedness, numbness and headaches.  Hematological: Negative for adenopathy.  Psychiatric/Behavioral: Negative for behavioral problems, dysphoric mood and sleep disturbance.   Per HPI unless specifically indicated  above      Objective:    BP (!) 127/52   Pulse 86   Temp 98.4 F (36.9 C) (Temporal)   Resp 16   Ht 5' 5.5" (1.664 m)   Wt 220 lb (99.8 kg)   SpO2 97%   BMI 36.05 kg/m   Wt Readings from Last 3 Encounters:  03/02/20 220 lb (99.8 kg)  10/16/19 216 lb (98 kg)  08/18/19 220 lb (99.8 kg)    Physical Exam Vitals and nursing note reviewed.  Constitutional:      General: She is not in acute distress.    Appearance: She is well-developed. She is not diaphoretic.     Comments: Well-appearing, comfortable, cooperative  HENT:     Head: Normocephalic and atraumatic.  Eyes:     General:        Right eye: No discharge.        Left eye: No discharge.     Conjunctiva/sclera: Conjunctivae normal.     Pupils: Pupils are equal, round, and reactive to light.  Neck:     Thyroid: No thyromegaly.  Cardiovascular:     Rate and Rhythm: Normal rate and regular rhythm.     Heart sounds: Normal heart sounds. No murmur heard.   Pulmonary:     Effort: Pulmonary effort is normal. No respiratory distress.     Breath sounds: Normal breath sounds. No wheezing or rales.  Abdominal:     General: Bowel sounds are normal. There is no distension.     Palpations: Abdomen is soft. There is no mass.     Tenderness: There is no abdominal tenderness.  Musculoskeletal:        General: No tenderness. Normal range of motion.     Cervical back: Normal range of motion and neck supple.     Right lower leg: Edema present.     Left lower leg: Edema present.     Comments: Upper / Lower Extremities: - Normal muscle tone, strength bilateral upper extremities 5/5, lower extremities 5/5  Lymphadenopathy:     Cervical: No cervical adenopathy.  Skin:    General: Skin is warm and dry.     Findings: No erythema or rash.  Neurological:     Mental Status: She is alert and oriented to person, place, and time.     Comments: Distal sensation intact to light touch all extremities  Psychiatric:        Behavior: Behavior  normal.  Comments: Well groomed, good eye contact, normal speech and thoughts       Results for orders placed or performed in visit on 02/20/20  VITAMIN D 25 Hydroxy (Vit-D Deficiency, Fractures)  Result Value Ref Range   Vit D, 25-Hydroxy 26 (L) 30 - 100 ng/mL  TSH  Result Value Ref Range   TSH 3.51 0.40 - 4.50 mIU/L  Lipid panel  Result Value Ref Range   Cholesterol 187 <200 mg/dL   HDL 44 (L) > OR = 50 mg/dL   Triglycerides 232 (H) <150 mg/dL   LDL Cholesterol (Calc) 108 (H) mg/dL (calc)   Total CHOL/HDL Ratio 4.3 <5.0 (calc)   Non-HDL Cholesterol (Calc) 143 (H) <130 mg/dL (calc)  COMPLETE METABOLIC PANEL WITH GFR  Result Value Ref Range   Glucose, Bld 106 (H) 65 - 99 mg/dL   BUN 15 7 - 25 mg/dL   Creat 0.80 0.60 - 0.88 mg/dL   GFR, Est Non African American 69 > OR = 60 mL/min/1.60m2   GFR, Est African American 80 > OR = 60 mL/min/1.37m2   BUN/Creatinine Ratio NOT APPLICABLE 6 - 22 (calc)   Sodium 137 135 - 146 mmol/L   Potassium 3.6 3.5 - 5.3 mmol/L   Chloride 98 98 - 110 mmol/L   CO2 26 20 - 32 mmol/L   Calcium 9.7 8.6 - 10.4 mg/dL   Total Protein 6.6 6.1 - 8.1 g/dL   Albumin 4.3 3.6 - 5.1 g/dL   Globulin 2.3 1.9 - 3.7 g/dL (calc)   AG Ratio 1.9 1.0 - 2.5 (calc)   Total Bilirubin 0.5 0.2 - 1.2 mg/dL   Alkaline phosphatase (APISO) 72 37 - 153 U/L   AST 18 10 - 35 U/L   ALT 20 6 - 29 U/L  CBC with Differential/Platelet  Result Value Ref Range   WBC 8.1 3.8 - 10.8 Thousand/uL   RBC 4.46 3.80 - 5.10 Million/uL   Hemoglobin 13.2 11.7 - 15.5 g/dL   HCT 39.8 35 - 45 %   MCV 89.2 80.0 - 100.0 fL   MCH 29.6 27.0 - 33.0 pg   MCHC 33.2 32.0 - 36.0 g/dL   RDW 13.1 11.0 - 15.0 %   Platelets 294 140 - 400 Thousand/uL   MPV 10.9 7.5 - 12.5 fL   Neutro Abs 5,921 1,500 - 7,800 cells/uL   Lymphs Abs 1,458 850 - 3,900 cells/uL   Absolute Monocytes 567 200 - 950 cells/uL   Eosinophils Absolute 113 15.0 - 500.0 cells/uL   Basophils Absolute 41 0.0 - 200.0 cells/uL    Neutrophils Relative % 73.1 %   Total Lymphocyte 18.0 %   Monocytes Relative 7.0 %   Eosinophils Relative 1.4 %   Basophils Relative 0.5 %  Hemoglobin A1c  Result Value Ref Range   Hgb A1c MFr Bld 5.6 <5.7 % of total Hgb   Mean Plasma Glucose 114 (calc)   eAG (mmol/L) 6.3 (calc)      Assessment & Plan:   Problem List Items Addressed This Visit    Vitamin D deficiency   Pre-diabetes    Well controlled PreDM A1c improved to A1c 5.6 Concern with obesity, HTN, HLD  Concern with neuropathy symptoms  Plan:  1. Not on any therapy currently  2. Encourage improved lifestyle - low carb, low sugar diet, reduce portion size, continue improving regular exercise      Obesity (BMI 35.0-39.9 without comorbidity)   Major depression in full remission ([place])    Remains improved  mood, in remission - she does seem lonely and frustrated but she seems to be coping well, has limited social support. 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     Follow up plan: Return in about 6 months (around 08/30/2020) for 6 month follow-up PreDM A1c, HTN, Arthritis.  Nobie Putnam, DO [facility] 03/02/2020, 4:15 PM

## 2020-03-03 NOTE — Assessment & Plan Note (Signed)
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

## 2020-03-03 NOTE — Assessment & Plan Note (Addendum)
Remains improved mood, in remission - she does seem lonely and frustrated but she seems to be coping well, has limited social support. Chronic history of MDD  Follow-up as planned

## 2020-03-03 NOTE — Assessment & Plan Note (Signed)
Well controlled PreDM A1c improved to A1c 5.6 Concern with obesity, HTN, HLD  Concern with neuropathy symptoms  Plan:  1. Not on any therapy currently  2. Encourage improved lifestyle - low carb, low sugar diet, reduce portion size, continue improving regular exercise

## 2020-03-05 ENCOUNTER — Telehealth: Payer: Medicare Other

## 2020-03-19 ENCOUNTER — Telehealth: Payer: Medicare Other

## 2020-03-19 ENCOUNTER — Telehealth: Payer: Self-pay | Admitting: Licensed Clinical Social Worker

## 2020-03-19 NOTE — Telephone Encounter (Signed)
  Chronic Care Management    Clinical Social Work General Follow Up Note  03/19/2020 Name: Candace Tapia MRN: [id number] DOB: 1936-01-10  Candace Tapia is a 84 y.o. year old female who is a primary care patient of Olin Hauser, DO. The CCM team was consulted for assistance with Mental Health Counseling and Resources.   Review of patient status, including review of consultants reports, relevant laboratory and other test results, and collaboration with appropriate care team members and the patient's provider was performed as part of comprehensive patient evaluation and provision of chronic care management services.    LCSW completed CCM outreach attempt today but was unable to reach patient successfully. A HIPPA compliant voice message was left encouraging patient to return call once available. LCSW will ask Scheduling Care Guide to reschedule CCM SW appointment with patient as well.   Outpatient Encounter Medications as of 03/19/2020  Medication Sig Note  . acetaminophen (TYLENOL) 650 MG CR tablet Take 650 mg by mouth every 8 (eight) hours as needed for pain.   Marland Kitchen albuterol (PROAIR HFA) 108 (90 BASE) MCG/ACT inhaler Inhale 1 puff into the lungs every 6 (six) hours as needed.    Marland Kitchen amLODipine (NORVASC) 5 MG tablet TAKE 1 TABLET(5 MG) BY MOUTH DAILY   . aspirin 81 MG chewable tablet Chew 81 mg by mouth daily as needed.  01/01/2019: Takes every once in a while  . budesonide-formoterol (SYMBICORT) 160-4.5 MCG/ACT inhaler Inhale 2 puffs into the lungs 2 (two) times daily.   . chlorthalidone (HYGROTON) 25 MG tablet TAKE 1 TABLET(25 MG) BY MOUTH DAILY   . Cholecalciferol (KP VITAMIN D3) 25 MCG (1000 UT) capsule Vitamin D3 25 mcg (1,000 unit) capsule  1 po qd   . fluticasone (FLONASE) 50 MCG/ACT nasal spray Place 1 spray into both nostrils as needed.    . Fluticasone-Umeclidin-Vilant (TRELEGY ELLIPTA) 100-62.5-25 MCG/INH AEPB Inhale 1 puff into the lungs daily.   Marland Kitchen gabapentin (NEURONTIN)  400 MG capsule TAKE 1 CAPSULE(400 MG) BY MOUTH TWICE DAILY   . losartan (COZAAR) 100 MG tablet TAKE 1 TABLET(100 MG) BY MOUTH DAILY   . meclizine (ANTIVERT) 25 MG tablet Take 0.5 tablets (12.5 mg total) by mouth every 8 (eight) hours as needed for dizziness.   . Multiple Vitamins-Minerals (MULTIVITAMIN WOMEN 50+) TABS Take 1 tablet by mouth daily.    Marland Kitchen NEXIUM 20 MG capsule Take 1 capsule (20 mg total) by mouth daily.   . potassium chloride (KLOR-CON SPRINKLE) 10 MEQ CR capsule Take 1 capsule (10 mEq total) by mouth daily. Open capsule, sprinkle over soft food, swallow whole.   Marland Kitchen Spacer/Aero-Holding Chambers (AEROCHAMBER PLUS) inhaler Use as instructed   . [DISCONTINUED] fexofenadine (ALLEGRA ALLERGY) 180 MG tablet Take 180 mg by mouth as needed. Reported on 09/29/2015    No facility-administered encounter medications on file as of 03/19/2020.   Follow Up Plan: [facility] will reach out to patient to reschedule appointment.   [name], BSW, MSW, [email or web address] Phone: [phone]

## 2020-03-22 DIAGNOSIS — J449 Chronic obstructive pulmonary disease, unspecified: Secondary | ICD-10-CM | POA: Diagnosis not present

## 2020-03-27 NOTE — Telephone Encounter (Signed)
Pt has been r/s  

## 2020-03-31 ENCOUNTER — Ambulatory Visit: Payer: Medicare Other | Admitting: Licensed Clinical Social Worker

## 2020-03-31 DIAGNOSIS — F3342 Major depressive disorder, recurrent, in full remission: Secondary | ICD-10-CM

## 2020-03-31 DIAGNOSIS — G894 Chronic pain syndrome: Secondary | ICD-10-CM

## 2020-03-31 DIAGNOSIS — J449 Chronic obstructive pulmonary disease, unspecified: Secondary | ICD-10-CM

## 2020-03-31 DIAGNOSIS — I10 Essential (primary) hypertension: Secondary | ICD-10-CM

## 2020-03-31 NOTE — Chronic Care Management (AMB) (Signed)
Chronic Care Management    Clinical Social Work Follow Up Note  03/31/2020 Name: MICKI CASSEL MRN: [id number] DOB: May 11, 1935  [name] is a 84 y.o. year old female who is a primary care patient of Olin Hauser, DO. The CCM team was consulted for assistance with Mental Health Counseling and Resources.   Review of patient status, including review of consultants reports, other relevant assessments, and collaboration with appropriate care team members and the patient's provider was performed as part of comprehensive patient evaluation and provision of chronic care management services.    SDOH (Social Determinants of Health) assessments performed: Yes    Outpatient Encounter Medications as of 03/31/2020  Medication Sig Note  . acetaminophen (TYLENOL) 650 MG CR tablet Take 650 mg by mouth every 8 (eight) hours as needed for pain.   Marland Kitchen albuterol (PROAIR HFA) 108 (90 BASE) MCG/ACT inhaler Inhale 1 puff into the lungs every 6 (six) hours as needed.    Marland Kitchen amLODipine (NORVASC) 5 MG tablet TAKE 1 TABLET(5 MG) BY MOUTH DAILY   . aspirin 81 MG chewable tablet Chew 81 mg by mouth daily as needed.  01/01/2019: Takes every once in a while  . budesonide-formoterol (SYMBICORT) 160-4.5 MCG/ACT inhaler Inhale 2 puffs into the lungs 2 (two) times daily.   . chlorthalidone (HYGROTON) 25 MG tablet TAKE 1 TABLET(25 MG) BY MOUTH DAILY   . Cholecalciferol (KP VITAMIN D3) 25 MCG (1000 UT) capsule Vitamin D3 25 mcg (1,000 unit) capsule  1 po qd   . fluticasone (FLONASE) 50 MCG/ACT nasal spray Place 1 spray into both nostrils as needed.    . Fluticasone-Umeclidin-Vilant (TRELEGY ELLIPTA) 100-62.5-25 MCG/INH AEPB Inhale 1 puff into the lungs daily.   Marland Kitchen gabapentin (NEURONTIN) 400 MG capsule TAKE 1 CAPSULE(400 MG) BY MOUTH TWICE DAILY   . losartan (COZAAR) 100 MG tablet TAKE 1 TABLET(100 MG) BY MOUTH DAILY   . meclizine (ANTIVERT) 25 MG tablet Take 0.5 tablets (12.5 mg total) by mouth every 8  (eight) hours as needed for dizziness.   . Multiple Vitamins-Minerals (MULTIVITAMIN WOMEN 50+) TABS Take 1 tablet by mouth daily.    Marland Kitchen NEXIUM 20 MG capsule Take 1 capsule (20 mg total) by mouth daily.   . potassium chloride (KLOR-CON SPRINKLE) 10 MEQ CR capsule Take 1 capsule (10 mEq total) by mouth daily. Open capsule, sprinkle over soft food, swallow whole.   Marland Kitchen Spacer/Aero-Holding Chambers (AEROCHAMBER PLUS) inhaler Use as instructed   . [DISCONTINUED] fexofenadine (ALLEGRA ALLERGY) 180 MG tablet Take 180 mg by mouth as needed. Reported on 09/29/2015    No facility-administered encounter medications on file as of 03/31/2020.     Goals Addressed    . SW-Track and Manage My Symptoms-Depression       Timeframe:  Long-Range Goal Priority:  Medium Start Date:  03/31/20                           Expected End Date:  06/29/20                    Follow Up Date- 90 days from 03/31/20   - avoid negative self-talk - develop a personal safety plan - develop a plan to deal with triggers like holidays, anniversaries - exercise at least 2 to 3 times per week - have a plan for how to handle bad days - journal feelings and what helps to feel better or worse - spend time or  talk with others at least 2 to 3 times per week - spend time or talk with others every day - watch for early signs of feeling worse - write in journal every day    Why is this important?    Keeping track of your progress will help your treatment team find the right mix of medicine and therapy for you.   Write in your journal every day.   Day-to-day changes in depression symptoms are normal. It may be more helpful to check your progress at the end of each week instead of every day.     Current Barriers:  . Financial constraints related to affording in home support or eventual long term care placement . Limited social support . Level of care concerns . ADL IADL limitations . Limited access to caregiver . Inability to  perform ADL's independently . Inability to perform IADL's independently . Ongoing depression  Clinical Social Work Clinical Goal(s):  Marland Kitchen Over the next 120 days, patient will work with SW to address concerns related to increasing depression and anxiety management and gaining additional support within the home until patient wishes to pursue LTC placement  Interventions: . Patient interviewed and appropriate assessments performed . Provided patient with information about long term care placement (ALF) and payment sources-out of pocket or Medicaid. LCSW also provided education on personal care service resources within the area. Patient agreeable to C.H.O.R.E referral. . Discussed plans with patient for ongoing care management follow up and provided patient with direct contact information for care management team . Advised patient to consider finding in home support. Patient found an aide that cost $20 per hour but is not able to afford this service. LCSW encouraged patient to network to see if she can find someone at a lesser rate. . Patient admits ongoing loneliness and depression. LCSW provided active and reflective listening throughout conversation. Patient shares that she puts on music when she is experiencing symptoms of depression and it helps. She was educated on other coping skills to implement into her daily routine to combat her depression. Patient was receptive to emotional support and healthy self-care education that was provided.  . Patient does NOT want CCM LCSW to make referral for psychiatry or counseling. She shares that she does not want medication management for her depression as she has tried medications in the past and they only made her worse. She was strongly encouraged to consider implementing counseling but patient declined wanting this referral.  . Assisted patient/caregiver with obtaining information about health plan benefits . Provided education and assistance to client  regarding Advanced Directives. . Provided education to patient/caregiver regarding level of care options. Marland Kitchen LCSW successfully coordinated care with Home Care Providers and was able to place patient on the wait list for C.H.O.R.E program on 06/20/19. Patient was advised to contact program to inquire about where she is on the wait list currently. . Patient reports that her daughter in law continues to get her groceries for her and provides transportation for her medical appointments. She has two sons that assist with her care. She reports that her family visits her usually once per week on Sundays. . Patient shares that her arthritis and COPD have flared up recently. Coping skill education provided for pain management.  . Patient reports that she is having recent difficulty with hearing over the phone even with her hearing aides.  Patient Self Care Activities:  . Self administers medications as prescribed . Attends all scheduled provider appointments . Unable to  independently take care of self within the home and is in need of additional personal care service involvement.   Please see past updates related to this goal by clicking on the "Past Updates" button in the selected goal       Follow Up Plan: SW will follow up with patient by phone over the next quarter  [name], [place], MSW, [email or web address] Phone: [phone]

## 2020-04-03 ENCOUNTER — Other Ambulatory Visit: Payer: Self-pay | Admitting: Family Medicine

## 2020-04-03 DIAGNOSIS — E876 Hypokalemia: Secondary | ICD-10-CM

## 2020-04-09 ENCOUNTER — Ambulatory Visit: Payer: Self-pay | Admitting: General Practice

## 2020-04-09 ENCOUNTER — Telehealth: Payer: Medicare Other | Admitting: General Practice

## 2020-04-09 DIAGNOSIS — E782 Mixed hyperlipidemia: Secondary | ICD-10-CM

## 2020-04-09 DIAGNOSIS — F419 Anxiety disorder, unspecified: Secondary | ICD-10-CM

## 2020-04-09 DIAGNOSIS — F3342 Major depressive disorder, recurrent, in full remission: Secondary | ICD-10-CM

## 2020-04-09 DIAGNOSIS — I10 Essential (primary) hypertension: Secondary | ICD-10-CM

## 2020-04-09 DIAGNOSIS — J449 Chronic obstructive pulmonary disease, unspecified: Secondary | ICD-10-CM

## 2020-04-09 DIAGNOSIS — G894 Chronic pain syndrome: Secondary | ICD-10-CM

## 2020-04-09 NOTE — Chronic Care Management (AMB) (Signed)
Chronic Care Management   Follow Up Note   04/09/2020 Name: Candace Tapia MRN: [id number] DOB: 27-Mar-1936  Referred by: Olin Hauser, DO Reason for referral : Chronic Care Management (RNCM: Chronic Disease Management and Care Coordination Needs)   Candace Tapia is a 84 y.o. year old female who is a primary care patient of Olin Hauser, DO. The CCM team was consulted for assistance with chronic disease management and care coordination needs.    Review of patient status, including review of consultants reports, relevant laboratory and other test results, and collaboration with appropriate care team members and the patient's provider was performed as part of comprehensive patient evaluation and provision of chronic care management services.    SDOH (Social Determinants of Health) assessments performed: Yes See Care Plan activities for detailed interventions related to [facility])     Outpatient Encounter Medications as of 04/09/2020  Medication Sig Note  . acetaminophen (TYLENOL) 650 MG CR tablet Take 650 mg by mouth every 8 (eight) hours as needed for pain.   Marland Kitchen albuterol (PROAIR HFA) 108 (90 BASE) MCG/ACT inhaler Inhale 1 puff into the lungs every 6 (six) hours as needed.    Marland Kitchen amLODipine (NORVASC) 5 MG tablet TAKE 1 TABLET(5 MG) BY MOUTH DAILY   . aspirin 81 MG chewable tablet Chew 81 mg by mouth daily as needed.  01/01/2019: Takes every once in a while  . budesonide-formoterol (SYMBICORT) 160-4.5 MCG/ACT inhaler Inhale 2 puffs into the lungs 2 (two) times daily.   . chlorthalidone (HYGROTON) 25 MG tablet TAKE 1 TABLET(25 MG) BY MOUTH DAILY   . Cholecalciferol (KP VITAMIN D3) 25 MCG (1000 UT) capsule Vitamin D3 25 mcg (1,000 unit) capsule  1 po qd   . fluticasone (FLONASE) 50 MCG/ACT nasal spray Place 1 spray into both nostrils as needed.    . Fluticasone-Umeclidin-Vilant (TRELEGY ELLIPTA) 100-62.5-25 MCG/INH AEPB Inhale 1 puff into the lungs daily.   Marland Kitchen  gabapentin (NEURONTIN) 400 MG capsule TAKE 1 CAPSULE(400 MG) BY MOUTH TWICE DAILY   . losartan (COZAAR) 100 MG tablet TAKE 1 TABLET(100 MG) BY MOUTH DAILY   . meclizine (ANTIVERT) 25 MG tablet Take 0.5 tablets (12.5 mg total) by mouth every 8 (eight) hours as needed for dizziness.   . Multiple Vitamins-Minerals (MULTIVITAMIN WOMEN 50+) TABS Take 1 tablet by mouth daily.    Marland Kitchen NEXIUM 20 MG capsule Take 1 capsule (20 mg total) by mouth daily.   . potassium chloride (MICRO-K) 10 MEQ CR capsule TAKE 1 CAPSULE BY MOUTH DAILY. MAY OPEN CAPSULE AND SPRINKLE OVER SOFT FOOD OR SWALLOW WHOLE   . Spacer/Aero-Holding Chambers (AEROCHAMBER PLUS) inhaler Use as instructed   . [DISCONTINUED] fexofenadine (ALLEGRA ALLERGY) 180 MG tablet Take 180 mg by mouth as needed. Reported on 09/29/2015    No facility-administered encounter medications on file as of 04/09/2020.     Objective:   Goals Addressed              This Visit's Progress   .  RNCM: I do the best I can and get by        Current Barriers:  . Chronic Disease Management support, education, and care coordination needs related to HTN, COPD, Anxiety, and Depression . Social Isolation/Limited support network . Limited Mobility  Clinical Goal(s) related to HTN, COPD, Anxiety, and Depression:  Over the next  120 days, patient will:  . Work with the care management team to address educational, disease management, and care coordination needs  .  Begin or continue self health monitoring activities as directed today Measure and record blood pressure 4 times per week . Call provider office for new or worsened signs and symptoms Blood pressure findings outside established parameters, Oxygen saturation lower than established parameter, Shortness of breath, and New or worsened symptom related to Depression and anxiety . Call care management team with questions or concerns . Verbalize basic understanding of patient centered plan of care established  today  Interventions related to HTN, COPD, Anxiety, and Depression:  . Evaluation of current treatment plans and patient's adherence to plan as established by provider.  The patient verbalized compliance with her plan of care. The patient does not take statins, review of notes reveal she can not tolerate and does not want to try a lower dose.  The patient does take ASA. 04-09-2020: The patient saw the pcp on 03-02-2020 and had a good appointment.  Her hemoglobin A1C was 5.6 and is stable for pre-diabetes. The patient denies any new concerns.  . Assessed patient understanding of disease states.  The patient verbalized understanding of her chronic conditions; however states she is "giddy" and forgetful.  Her biggest issue is social isolation and being by herself a lot. 10-03-2019: Discussed ways to increase social activity. The patient is a "natural loner", but she wants to have companionship now. The patient states she sees her sons about an hour a week. She use to talk to her brothers and sisters on the phone but she is the only living sibling left. The patient says she does not qualify for Medicaid services but she will check with [facility] insurance about her benefits. She may have to consider moving to a LTC facility or assisted living. She says she has good days and bad days and today is a good day. 02-06-2020: The patient is thankful for the cooler weather. It helps her breathing. Review of the use of her Trelegy.  The patient states she is using it but not everyday. She says it makes her feel tight in the chest.  Recommended talking with pcp about her concerns. She says that she feels it is helping with her breathing but the tightness in her chest makes her feel uneasy.  Review of rinsing mouth out when using and she says she definitely does this. 04-09-2020: The  patient states her breathing is stable right now.  She is being mindful of all the things around her that can potential put her at risk for getting  sick. The patient is looking forward to being with her family for Christmas and the socialization she will have with her family. This means a lot to her.  . Assessed patient's education and care coordination needs.  The patient verbalized sometimes she thinks about going to an assisted living but she is unsure. Is receptive to talking to the care guide about assisted living places in her area. Will place a care guide referral for assistance. 10-03-2019: The patient has talked to the care guides and the LCSW about facilities. The patient is not sure what to do but feels in the future this is something she will have to do. 04-09-2020: the patient is doing well as far as being alone. She does wish she could have an aide come in a couple times a week. Discussed this would likely be an out of pocket expense unless she qualified for medicaid. The patient verbalized she would not qualify for medicaid. The patient patient has discussed this several times with the RNCM and LCSW.  Will continue to monitor for needs and changes. Right now she is maintaining and doing well with her depression and anxiety.   . Provided disease specific education to patient.  Education on following a Heart Healthy/low carb diet.  The patient states she tries to watch her sodium content and fats and needs to lose some weight. She takes a fluid pill, denies any fluid in her feet and legs.  04-09-2020: Education on watching her dietary intake. The patient states that she is eating well and being mindful of sugars, salts, and fats. She denies issues with adherence to dietary restrictions. She is sleeping well and getting good rest.  . Evaluation of depression specifically. The patient has a long standing history of this. She states medications make it worse.  Her one friend who she use to do things with is declining in health and memory problems therefore she does not have that anymore. She has not hobbies she likes to do. Talked to the patient  about coping skills and possibly having outings with her daughter in law. She states her daughter in law works and this is not always possible.  Discussed audio books but the patient was unsure of this. 10-03-2019: The patient is open to ideas to help with her depression. Hobbies that use to interest her do not now. The patient is open to ideas and admits she is curious about things. She will let the RNCM know ideas to help her with her depression. Also working with LCSW. 04-09-2020: The patient is reading and watching a lot of TV.  The patient was in a good mood today and feels she is doing well right now. Continues to do what she needs to do to maintain her health and well being. States her blood pressures are WNL., she does not take her blood pressure often as she forgets but when she does check her blood pressure it is good. Denies any headaches.  . Evaluation of COVID vaccine. The patient has not had Covid vaccine. Recommended the patient talk to pcp about her concerns.  Nash Dimmer with appropriate clinical care team members regarding patient needs.  Patient is currently involved with LCSW and pharmacist.  . Evaluation of upcoming appointments: PCP appointment on 09-21-2020  Patient Self Care Activities related to HTN, COPD, Anxiety, and Depression:  . Patient is unable to independently self-manage chronic health conditions  Please see past updates related to this goal by clicking on the "Past Updates" button in the selected goal      .  RNCM: pt-"I am having a time some days with my arthritis pain" (pt-stated)        CARE PLAN ENTRY (see longitudinal plan of care for additional care plan information)  Current Barriers:  Marland Kitchen Knowledge Deficits related to chronic osteoarthritis pain . Chronic Disease Management support and education needs related to chronic osteoarthritis pain . Lacks caregiver support.   Nurse Case Manager Clinical Goal(s):  Marland Kitchen Over the next 120 days, patient will verbalize  understanding of plan for pain management for chronic osteoarthritis  . Over the next 120 days, patient will work with [facility] [place] team and pcp to address needs related to osteoarthritis . Over the next 120 days, patient will work with CM team pharmacist to assist with medications management and pain medication recommendations  Interventions:  . Inter-disciplinary care team collaboration (see longitudinal plan of care) . Evaluation of current treatment plan related to pain management  and patient's adherence to plan as established by provider.  04-09-2020: The patient states she is still having arthritis pain but her current regimen is working well. She is just getting older and some days are worse than others. Today the patient is having a good day.  . Advised patient to notify the provider of changes in level or intensity of pain . Provided education to patient re: alternative measures to help with pain management. The patient currently uses tylenol arthritis for pain relief.  . Reviewed medications with patient and discussed compliance. The patient has been working with the pharmacist with compliance and medication management  . Collaborated with CCM team and pcp regarding pain management and patient needs.  02-06-2020: The patient states her pain level is the same. Denies any acute changes in pain. States she has good days and bad days. Today is a good day.  . Discussed plans with patient for ongoing care management follow up and provided patient with direct contact information for care management team . Reviewed scheduled/upcoming provider appointments including: Appointment with pcp on 02-18-2020 at 11:20 am.  Patient Self Care Activities:  . Patient verbalizes understanding of plan to talk to pcp about changes in her pain and recommendations for pain management  . Attends all scheduled provider appointments . Calls provider office for new concerns or questions . Unable to independently manage  chronic pain from osteoarthritis   Please see past updates related to this goal by clicking on the "Past Updates" button in the selected goal           Plan:   Telephone follow up appointment with care management team member scheduled for: 06-04-2020 at 0900 am   Noreene Larsson RN, MSN, [place] Mobile: [phone]

## 2020-04-09 NOTE — Patient Instructions (Signed)
Visit Information  Goals Addressed              This Visit's Progress     RNCM: I do the best I can and get by        Current Barriers:   Chronic Disease Management support, education, and care coordination needs related to HTN, COPD, Anxiety, and Depression  Social Isolation/Limited support network  Limited Mobility  Clinical Goal(s) related to HTN, COPD, Anxiety, and Depression:  Over the next  120 days, patient will:   Work with the care management team to address educational, disease management, and care coordination needs   Begin or continue self health monitoring activities as directed today Measure and record blood pressure 4 times per week  Call provider office for new or worsened signs and symptoms Blood pressure findings outside established parameters, Oxygen saturation lower than established parameter, Shortness of breath, and New or worsened symptom related to Depression and anxiety  Call care management team with questions or concerns  Verbalize basic understanding of patient centered plan of care established today  Interventions related to HTN, COPD, Anxiety, and Depression:   Evaluation of current treatment plans and patient's adherence to plan as established by provider.  The patient verbalized compliance with her plan of care. The patient does not take statins, review of notes reveal she can not tolerate and does not want to try a lower dose.  The patient does take ASA. 04-09-2020: The patient saw the pcp on 03-02-2020 and had a good appointment.  Her hemoglobin A1C was 5.6 and is stable for pre-diabetes. The patient denies any new concerns.   Assessed patient understanding of disease states.  The patient verbalized understanding of her chronic conditions; however states she is "giddy" and forgetful.  Her biggest issue is social isolation and being by herself a lot. 10-03-2019: Discussed ways to increase social activity. The patient is a "natural loner", but she  wants to have companionship now. The patient states she sees her sons about an hour a week. She use to talk to her brothers and sisters on the phone but she is the only living sibling left. The patient says she does not qualify for Medicaid services but she will check with [facility] insurance about her benefits. She may have to consider moving to a LTC facility or assisted living. She says she has good days and bad days and today is a good day. 02-06-2020: The patient is thankful for the cooler weather. It helps her breathing. Review of the use of her Trelegy.  The patient states she is using it but not everyday. She says it makes her feel tight in the chest.  Recommended talking with pcp about her concerns. She says that she feels it is helping with her breathing but the tightness in her chest makes her feel uneasy.  Review of rinsing mouth out when using and she says she definitely does this. 04-09-2020: The  patient states her breathing is stable right now.  She is being mindful of all the things around her that can potential put her at risk for getting sick. The patient is looking forward to being with her family for Christmas and the socialization she will have with her family. This means a lot to her.   Assessed patient's education and care coordination needs.  The patient verbalized sometimes she thinks about going to an assisted living but she is unsure. Is receptive to talking to the care guide about assisted living places in her  area. Will place a care guide referral for assistance. 10-03-2019: The patient has talked to the care guides and the LCSW about facilities. The patient is not sure what to do but feels in the future this is something she will have to do. 04-09-2020: the patient is doing well as far as being alone. She does wish she could have an aide come in a couple times a week. Discussed this would likely be an out of pocket expense unless she qualified for medicaid. The patient verbalized she would  not qualify for medicaid. The patient patient has discussed this several times with the RNCM and LCSW. Will continue to monitor for needs and changes. Right now she is maintaining and doing well with her depression and anxiety.    Provided disease specific education to patient.  Education on following a Heart Healthy/low carb diet.  The patient states she tries to watch her sodium content and fats and needs to lose some weight. She takes a fluid pill, denies any fluid in her feet and legs.  04-09-2020: Education on watching her dietary intake. The patient states that she is eating well and being mindful of sugars, salts, and fats. She denies issues with adherence to dietary restrictions. She is sleeping well and getting good rest.   Evaluation of depression specifically. The patient has a long standing history of this. She states medications make it worse.  Her one friend who she use to do things with is declining in health and memory problems therefore she does not have that anymore. She has not hobbies she likes to do. Talked to the patient about coping skills and possibly having outings with her daughter in law. She states her daughter in law works and this is not always possible.  Discussed audio books but the patient was unsure of this. 10-03-2019: The patient is open to ideas to help with her depression. Hobbies that use to interest her do not now. The patient is open to ideas and admits she is curious about things. She will let the RNCM know ideas to help her with her depression. Also working with LCSW. 04-09-2020: The patient is reading and watching a lot of TV.  The patient was in a good mood today and feels she is doing well right now. Continues to do what she needs to do to maintain her health and well being. States her blood pressures are WNL., she does not take her blood pressure often as she forgets but when she does check her blood pressure it is good. Denies any headaches.   Evaluation of COVID  vaccine. The patient has not had Covid vaccine. Recommended the patient talk to pcp about her concerns.   Collaborated with appropriate clinical care team members regarding patient needs.  Patient is currently involved with LCSW and pharmacist.   Evaluation of upcoming appointments: PCP appointment on 09-21-2020  Patient Self Care Activities related to HTN, COPD, Anxiety, and Depression:   Patient is unable to independently self-manage chronic health conditions  Please see past updates related to this goal by clicking on the "Past Updates" button in the selected goal        RNCM: pt-"I am having a time some days with my arthritis pain" (pt-stated)        CARE PLAN ENTRY (see longitudinal plan of care for additional care plan information)  Current Barriers:   Knowledge Deficits related to chronic osteoarthritis pain  Chronic Disease Management support and education needs related to chronic osteoarthritis pain  Lacks caregiver support.   Nurse Case Manager Clinical Goal(s):   Over the next 120 days, patient will verbalize understanding of plan for pain management for chronic osteoarthritis   Over the next 120 days, patient will work with [facility], CCM team and pcp to address needs related to osteoarthritis  Over the next 120 days, patient will work with CM team pharmacist to assist with medications management and pain medication recommendations  Interventions:   Inter-disciplinary care team collaboration (see longitudinal plan of care)  Evaluation of current treatment plan related to pain management  and patient's adherence to plan as established by provider.  04-09-2020: The patient states she is still having arthritis pain but her current regimen is working well. She is just getting older and some days are worse than others. Today the patient is having a good day.   Advised patient to notify the provider of changes in level or intensity of pain  Provided education to patient re:  alternative measures to help with pain management. The patient currently uses tylenol arthritis for pain relief.   Reviewed medications with patient and discussed compliance. The patient has been working with the pharmacist with compliance and medication management   Collaborated with CCM team and pcp regarding pain management and patient needs.  02-06-2020: The patient states her pain level is the same. Denies any acute changes in pain. States she has good days and bad days. Today is a good day.   Discussed plans with patient for ongoing care management follow up and provided patient with direct contact information for care management team  Reviewed scheduled/upcoming provider appointments including: Appointment with pcp on 02-18-2020 at 11:20 am.  Patient Self Care Activities:   Patient verbalizes understanding of plan to talk to pcp about changes in her pain and recommendations for pain management   Attends all scheduled provider appointments  Calls provider office for new concerns or questions  Unable to independently manage chronic pain from osteoarthritis   Please see past updates related to this goal by clicking on the "Past Updates" button in the selected goal         The patient verbalized understanding of instructions, educational materials, and care plan provided today and declined offer to receive copy of patient instructions, educational materials, and care plan.   Telephone follow up appointment with care management team member scheduled for: 06-04-2020 at 0900 am  Noreene Larsson RN, MSN, [place] Mobile: [phone]

## 2020-04-13 ENCOUNTER — Other Ambulatory Visit: Payer: Self-pay | Admitting: Family Medicine

## 2020-04-13 DIAGNOSIS — I129 Hypertensive chronic kidney disease with stage 1 through stage 4 chronic kidney disease, or unspecified chronic kidney disease: Secondary | ICD-10-CM

## 2020-04-13 DIAGNOSIS — I10 Essential (primary) hypertension: Secondary | ICD-10-CM

## 2020-04-22 DIAGNOSIS — J449 Chronic obstructive pulmonary disease, unspecified: Secondary | ICD-10-CM | POA: Diagnosis not present

## 2020-05-12 DIAGNOSIS — J449 Chronic obstructive pulmonary disease, unspecified: Secondary | ICD-10-CM | POA: Diagnosis not present

## 2020-05-12 DIAGNOSIS — R0600 Dyspnea, unspecified: Secondary | ICD-10-CM | POA: Diagnosis not present

## 2020-05-12 DIAGNOSIS — G4734 Idiopathic sleep related nonobstructive alveolar hypoventilation: Secondary | ICD-10-CM | POA: Diagnosis not present

## 2020-05-23 DIAGNOSIS — J449 Chronic obstructive pulmonary disease, unspecified: Secondary | ICD-10-CM | POA: Diagnosis not present

## 2020-06-01 DIAGNOSIS — M7541 Impingement syndrome of right shoulder: Secondary | ICD-10-CM | POA: Diagnosis not present

## 2020-06-04 ENCOUNTER — Ambulatory Visit (INDEPENDENT_AMBULATORY_CARE_PROVIDER_SITE_OTHER): Payer: Medicare Other | Admitting: General Practice

## 2020-06-04 ENCOUNTER — Telehealth: Payer: Medicare Other | Admitting: General Practice

## 2020-06-04 DIAGNOSIS — I10 Essential (primary) hypertension: Secondary | ICD-10-CM

## 2020-06-04 DIAGNOSIS — J449 Chronic obstructive pulmonary disease, unspecified: Secondary | ICD-10-CM

## 2020-06-04 NOTE — Chronic Care Management (AMB) (Signed)
Chronic Care Management   CCM RN Visit Note  06/04/2020 Name: Candace Tapia MRN: [id number] DOB: 11-Jun-1935  Subjective: Candace Tapia is a 85 y.o. year old female who is a primary care patient of Olin Hauser, DO. The care management team was consulted for assistance with disease management and care coordination needs.    Engaged with patient by telephone for follow up visit in response to provider referral for case management and/or care coordination services.   Consent to Services:  The patient was given information about Chronic Care Management services, agreed to services, and gave verbal consent prior to initiation of services.  Please see initial visit note for detailed documentation.   Patient agreed to services and verbal consent obtained.   Assessment: Review of patient past medical history, allergies, medications, health status, including review of consultants reports, laboratory and other test data, was performed as part of comprehensive evaluation and provision of chronic care management services.   SDOH (Social Determinants of Health) assessments and interventions performed:    CCM Care Plan  Allergies  Allergen Reactions   Tramadol Hcl Itching    Per pt it caused me to itch all over   Statins     Not sure what exact med was; just made her feel bad (can't elaborate)   Penicillin G Rash   Penicillins Rash    Has patient had a PCN reaction causing immediate rash, facial/tongue/throat swelling, SOB or lightheadedness with hypotension: No Has patient had a PCN reaction causing severe rash involving mucus membranes or skin necrosis: No Has patient had a PCN reaction that required hospitalization: No Has patient had a PCN reaction occurring within the last 10 years: No If all of the above answers are "NO", then may proceed with Cephalosporin use.   Sulfa Antibiotics Other (See Comments) and Rash    Outpatient Encounter Medications as of 06/04/2020   Medication Sig Note   acetaminophen (TYLENOL) 650 MG CR tablet Take 650 mg by mouth every 8 (eight) hours as needed for pain.    albuterol (PROAIR HFA) 108 (90 BASE) MCG/ACT inhaler Inhale 1 puff into the lungs every 6 (six) hours as needed.     amLODipine (NORVASC) 5 MG tablet TAKE 1 TABLET(5 MG) BY MOUTH DAILY    aspirin 81 MG chewable tablet Chew 81 mg by mouth daily as needed.  01/01/2019: Takes every once in a while   budesonide-formoterol (SYMBICORT) 160-4.5 MCG/ACT inhaler Inhale 2 puffs into the lungs 2 (two) times daily.    chlorthalidone (HYGROTON) 25 MG tablet TAKE 1 TABLET(25 MG) BY MOUTH DAILY    Cholecalciferol (KP VITAMIN D3) 25 MCG (1000 UT) capsule Vitamin D3 25 mcg (1,000 unit) capsule  1 po qd    fluticasone (FLONASE) 50 MCG/ACT nasal spray Place 1 spray into both nostrils as needed.     Fluticasone-Umeclidin-Vilant (TRELEGY ELLIPTA) 100-62.5-25 MCG/INH AEPB Inhale 1 puff into the lungs daily.    gabapentin (NEURONTIN) 400 MG capsule TAKE 1 CAPSULE(400 MG) BY MOUTH TWICE DAILY    losartan (COZAAR) 100 MG tablet TAKE 1 TABLET(100 MG) BY MOUTH DAILY    meclizine (ANTIVERT) 25 MG tablet Take 0.5 tablets (12.5 mg total) by mouth every 8 (eight) hours as needed for dizziness.    Multiple Vitamins-Minerals (MULTIVITAMIN WOMEN 50+) TABS Take 1 tablet by mouth daily.     NEXIUM 20 MG capsule Take 1 capsule (20 mg total) by mouth daily.    potassium chloride (MICRO-K) 10 MEQ CR capsule TAKE  1 CAPSULE BY MOUTH DAILY. MAY OPEN CAPSULE AND SPRINKLE OVER SOFT FOOD OR SWALLOW WHOLE    Spacer/Aero-Holding Chambers (AEROCHAMBER PLUS) inhaler Use as instructed    [DISCONTINUED] fexofenadine (ALLEGRA ALLERGY) 180 MG tablet Take 180 mg by mouth as needed. Reported on 09/29/2015    No facility-administered encounter medications on file as of 06/04/2020.    Patient Active Problem List   Diagnosis Date Noted   Obesity (BMI 35.0-39.9 without comorbidity) 01/09/2019    Peripheral polyneuropathy 12/27/2017   Vestibular disorder 03/20/2017   CKD (chronic kidney disease), stage II 12/09/2016   Chronic venous insufficiency 11/25/2016   Bilateral lower extremity edema 10/27/2016   Pre-diabetes 08/12/2016   Carotid bruit 02/09/2016   History of left breast cancer 02/09/2016   Long term current use of opiate analgesic 02/08/2016   Long term prescription opiate use 02/08/2016   Drug level above therapeutic range 02/08/2016   Chronic low back pain (Location of Tertiary source of pain) (Bilateral) (R>L) 02/08/2016   Chronic knee pain (Location of Secondary source of pain) (Bilateral) (L>R) 02/08/2016   History bilateral shoulder replacement 02/08/2016   Chronic hip pain (Location of Primary Source of Pain) (Bilateral) (R>L) 01/07/2016   Complete rupture of rotator cuff 01/07/2016   Hyperlipidemia 01/07/2016   Osteopenia 01/07/2016   Centrilobular emphysema ([place]) 07/21/2015   DDD (degenerative disc disease), lumbar 03/05/2015   Lumbar facet syndrome (Bilateral) (R>L) 03/05/2015   Sacroiliac joint dysfunction 03/05/2015   Osteoarthritis of hip (Bilateral) (R>L) 03/05/2015   DJD of shoulder 03/05/2015   Abnormal finding on mammography 03/03/2015   Back pain with radiation 03/03/2015   Major depression in full remission ([place]) 03/03/2015   Dry skin 03/03/2015   Hypokalemia 03/03/2015   Drug-induced myopathy 03/03/2015   Gastro-esophageal reflux disease without esophagitis 03/03/2015   Allergic rhinitis, seasonal 03/03/2015   Osteoarthritis of multiple joints 03/03/2015   Benign hypertension with CKD (chronic kidney disease), stage II 11/28/2014   Chronic pain 11/28/2014   Vitamin D deficiency 11/28/2014   Airway hyperreactivity 09/18/2013   Microscopic hematuria 12/20/2011    Conditions to be addressed/monitored:HTN and COPD  Care Plan : RNCM: COPD (Adult)  Updates made by Vanita Ingles since 06/04/2020 12:00 AM     Problem: RNCM: Psychological Adjustment to Diagnosis (COPD)   Priority: Medium    Long-Range Goal: RNCM: COPD management   Priority: Medium  Note:   Current Barriers:   Knowledge deficits related to basic understanding of COPD disease process  Knowledge deficits related to basic COPD self care/management  Knowledge deficit related to basic understanding of how to use inhalers and how inhaled medications work  Knowledge deficit related to importance of energy conservation  Limited Social Support  Lacks social connections  Does not contact provider office for questions/concerns  Case Manager Clinical Goal(s):  Over the next 120 days patient will report using inhalers as prescribed including rinsing mouth after use  Over the next 120 days patient will report utilizing pursed lip breathing for shortness of breath  Over the next 120 days, patient will be able to verbalize understanding of COPD action plan and when to seek appropriate levels of medical care  Over the next 120 days, patient will engage in lite exercise as tolerated to build/regain stamina and strength and reduce shortness of breath through activity tolerance  Over the next 120 days, patient will verbalize basic understanding of COPD disease process and self care activities  Over the next 120 days, patient will not be hospitalized  for COPD exacerbation as evidenced  Interventions:   Collaboration with Olin Hauser, DO regarding development and update of comprehensive plan of care as evidenced by provider attestation and co-signature  Inter-disciplinary care team collaboration (see longitudinal plan of care)  Provided patient with basic written and verbal COPD education on self care/management/and exacerbation prevention   Provided patient with COPD action plan and reinforced importance of daily self assessment  Provided written and verbal instructions on pursed lip breathing and utilized returned  demonstration as teach back  Provided instruction about proper use of medications used for management of COPD including inhalers  Advised patient to self assesses COPD action plan zone and make appointment with provider if in the yellow zone for 48 hours without improvement.  Provided patient with education about the role of exercise in the management of COPD  Advised patient to engage in light exercise as tolerated 3-5 days a week  Provided education about and advised patient to utilize infection prevention strategies to reduce risk of respiratory infection  Patient Goals/Self-Care Activities:   - decision-making supported  - depression screen reviewed  - emotional support provided  - family involvement promoted  - problem-solving facilitated  - relaxation techniques promoted  - verbalization of feelings encouraged Follow Up Plan: Telephone follow up appointment with care management team member scheduled for: 07-20-2020 at 0900 am   Task: RNCM: Support Psychosocial Response to Chronic Obstructive Pulmonary Disease   Note:   Care Management Activities:    - decision-making supported - depression screen reviewed - emotional support provided - family involvement promoted - problem-solving facilitated - relaxation techniques promoted - verbalization of feelings encouraged     Care Plan : RNCM: Hypertension (Adult)  Updates made by Vanita Ingles since 06/04/2020 12:00 AM    Problem: RNCM: HTN   Priority: Medium    Goal: RNCM: Hypertension Monitored   Priority: Medium  Note:   Objective:   Last practice recorded BP readings:  BP Readings from Last 3 Encounters:  03/02/20 (!) 127/52  10/16/19 (!) 122/56  10/02/19 137/70     Most recent eGFR/CrCl: No results found for: EGFR  No components found for: CRCL Current Barriers:   Knowledge Deficits related to basic understanding of hypertension pathophysiology and self care management  Knowledge Deficits related to  understanding of medications prescribed for management of hypertension  Limited Social Support  Lacks social connections  Does not contact provider office for questions/concerns Case Manager Clinical Goal(s):   Over the next 120 days, patient will verbalize understanding of plan for hypertension management  Over the next 120 days, patient will attend all scheduled medical appointments: 09-21-2020  Over the next 120 days, patient will demonstrate improved adherence to prescribed treatment plan for hypertension as evidenced by taking all medications as prescribed, monitoring and recording blood pressure as directed, adhering to low sodium/DASH diet  Over the next 120 days, patient will demonstrate improved health management independence as evidenced by checking blood pressure as directed and notifying PCP if SBP>160 or DBP > 90, taking all medications as prescribe, and adhering to a low sodium diet as discussed.  Over the next 120 days, patient will verbalize basic understanding of hypertension disease process and self health management plan as evidenced by compliance with medications, compliance with diet, and working with CCM team to manage health and well being  Interventions:   Collaboration with [place], Devonne Doughty, DO regarding development and update of comprehensive plan of care as evidenced by provider attestation and  co-signature  Inter-disciplinary care team collaboration (see longitudinal plan of care)  Evaluation of current treatment plan related to hypertension self management and patient's adherence to plan as established by provider.  Provided education to patient re: stroke prevention, s/s of heart attack and stroke, DASH diet, complications of uncontrolled blood pressure  Reviewed medications with patient and discussed importance of compliance  Discussed plans with patient for ongoing care management follow up and provided patient with direct contact information for  care management team  Advised patient, providing education and rationale, to monitor blood pressure daily and record, calling PCP for findings outside established parameters.   Reviewed scheduled/upcoming provider appointments including: 09-21-2020 at 2 pm Patient Goals/Self-Care Activities  Over the next 120 days, patient will:  - Self administers medications as prescribed Attends all scheduled provider appointments Calls provider office for new concerns, questions, or BP outside discussed parameters Checks BP and records as discussed Follows a low sodium diet/DASH diet - blood pressure trends reviewed - depression screen reviewed - home or ambulatory blood pressure monitoring encouraged Follow Up Plan: Telephone follow up appointment with care management team member scheduled for: 07-20-2020 oat 0900   Task: RNCM: Identify and Monitor Blood Pressure Elevation   Note:   Care Management Activities:    - blood pressure trends reviewed - depression screen reviewed - home or ambulatory blood pressure monitoring encouraged       Plan:Telephone follow up appointment with care management team member scheduled for:  07-20-2020 at 0900 am  [place], MSN, [place] Mobile: [phone]

## 2020-06-04 NOTE — Patient Instructions (Signed)
Visit Information  PATIENT GOALS: Patient Care Plan: RNCM: COPD (Adult)    Problem Identified: RNCM: Psychological Adjustment to Diagnosis (COPD)   Priority: Medium    Long-Range Goal: RNCM: COPD management   Priority: Medium  Note:   Current Barriers:  Marland Kitchen Knowledge deficits related to basic understanding of COPD disease process . Knowledge deficits related to basic COPD self care/management . Knowledge deficit related to basic understanding of how to use inhalers and how inhaled medications work . Knowledge deficit related to importance of energy conservation . Limited Social Support . Lacks social connections . Does not contact provider office for questions/concerns  Case Manager Clinical Goal(s):  Over the next 120 days patient will report using inhalers as prescribed including rinsing mouth after use  Over the next 120 days patient will report utilizing pursed lip breathing for shortness of breath  Over the next 120 days, patient will be able to verbalize understanding of COPD action plan and when to seek appropriate levels of medical care  Over the next 120 days, patient will engage in lite exercise as tolerated to build/regain stamina and strength and reduce shortness of breath through activity tolerance  Over the next 120 days, patient will verbalize basic understanding of COPD disease process and self care activities  Over the next 120 days, patient will not be hospitalized for COPD exacerbation as evidenced  Interventions:  . Collaboration with Olin Hauser, DO regarding development and update of comprehensive plan of care as evidenced by provider attestation and co-signature . Inter-disciplinary care team collaboration (see longitudinal plan of care)  Provided patient with basic written and verbal COPD education on self care/management/and exacerbation prevention   Provided patient with COPD action plan and reinforced importance of daily self  assessment  Provided written and verbal instructions on pursed lip breathing and utilized returned demonstration as teach back  Provided instruction about proper use of medications used for management of COPD including inhalers  Advised patient to self assesses COPD action plan zone and make appointment with provider if in the yellow zone for 48 hours without improvement.  Provided patient with education about the role of exercise in the management of COPD  Advised patient to engage in light exercise as tolerated 3-5 days a week  Provided education about and advised patient to utilize infection prevention strategies to reduce risk of respiratory infection  Patient Goals/Self-Care Activities:  . - decision-making supported . - depression screen reviewed . - emotional support provided . - family involvement promoted . - problem-solving facilitated . - relaxation techniques promoted . - verbalization of feelings encouraged Follow Up Plan: Telephone follow up appointment with care management team member scheduled for: 07-20-2020 at 0900 am   Task: RNCM: Support Psychosocial Response to Chronic Obstructive Pulmonary Disease   Note:   Care Management Activities:    - decision-making supported - depression screen reviewed - emotional support provided - family involvement promoted - problem-solving facilitated - relaxation techniques promoted - verbalization of feelings encouraged     Patient Care Plan: RNCM: Hypertension (Adult)    Problem Identified: RNCM: HTN   Priority: Medium    Goal: RNCM: Hypertension Monitored   Priority: Medium  Note:   Objective:  . Last practice recorded BP readings:  BP Readings from Last 3 Encounters:  03/02/20 (!) 127/52  10/16/19 (!) 122/56  10/02/19 137/70 .   Marland Kitchen Most recent eGFR/CrCl: No results found for: EGFR  No components found for: CRCL Current Barriers:  Marland Kitchen Knowledge Deficits related  to basic understanding of hypertension  pathophysiology and self care management . Knowledge Deficits related to understanding of medications prescribed for management of hypertension . Limited Social Support . Lacks social connections . Does not contact provider office for questions/concerns Case Manager Clinical Goal(s):  Marland Kitchen Over the next 120 days, patient will verbalize understanding of plan for hypertension management . Over the next 120 days, patient will attend all scheduled medical appointments: 09-21-2020 . Over the next 120 days, patient will demonstrate improved adherence to prescribed treatment plan for hypertension as evidenced by taking all medications as prescribed, monitoring and recording blood pressure as directed, adhering to low sodium/DASH diet . Over the next 120 days, patient will demonstrate improved health management independence as evidenced by checking blood pressure as directed and notifying PCP if SBP>160 or DBP > 90, taking all medications as prescribe, and adhering to a low sodium diet as discussed. . Over the next 120 days, patient will verbalize basic understanding of hypertension disease process and self health management plan as evidenced by compliance with medications, compliance with diet, and working with CCM team to manage health and well being  Interventions:  . Collaboration with Olin Hauser, DO regarding development and update of comprehensive plan of care as evidenced by provider attestation and co-signature . Inter-disciplinary care team collaboration (see longitudinal plan of care) . Evaluation of current treatment plan related to hypertension self management and patient's adherence to plan as established by provider. . Provided education to patient re: stroke prevention, s/s of heart attack and stroke, DASH diet, complications of uncontrolled blood pressure . Reviewed medications with patient and discussed importance of compliance . Discussed plans with patient for ongoing care  management follow up and provided patient with direct contact information for care management team . Advised patient, providing education and rationale, to monitor blood pressure daily and record, calling PCP for findings outside established parameters.  . Reviewed scheduled/upcoming provider appointments including: 09-21-2020 at 2 pm Patient Goals/Self-Care Activities . Over the next 120 days, patient will:  - Self administers medications as prescribed Attends all scheduled provider appointments Calls provider office for new concerns, questions, or BP outside discussed parameters Checks BP and records as discussed Follows a low sodium diet/DASH diet - blood pressure trends reviewed - depression screen reviewed - home or ambulatory blood pressure monitoring encouraged Follow Up Plan: Telephone follow up appointment with care management team member scheduled for: 07-20-2020 oat 0900   Task: RNCM: Identify and Monitor Blood Pressure Elevation   Note:   Care Management Activities:    - blood pressure trends reviewed - depression screen reviewed - home or ambulatory blood pressure monitoring encouraged       The patient verbalized understanding of instructions, educational materials, and care plan provided today and declined offer to receive copy of patient instructions, educational materials, and care plan.   Telephone follow up appointment with care management team member scheduled for: 07-20-2020 at 0900 am  Noreene Larsson RN, MSN, [place] Mobile: [phone]

## 2020-06-10 ENCOUNTER — Ambulatory Visit: Payer: Medicare Other | Admitting: Licensed Clinical Social Worker

## 2020-06-10 ENCOUNTER — Telehealth: Payer: Self-pay | Admitting: Licensed Clinical Social Worker

## 2020-06-10 DIAGNOSIS — E782 Mixed hyperlipidemia: Secondary | ICD-10-CM | POA: Diagnosis not present

## 2020-06-10 DIAGNOSIS — F3342 Major depressive disorder, recurrent, in full remission: Secondary | ICD-10-CM | POA: Diagnosis not present

## 2020-06-10 DIAGNOSIS — I10 Essential (primary) hypertension: Secondary | ICD-10-CM

## 2020-06-10 DIAGNOSIS — J449 Chronic obstructive pulmonary disease, unspecified: Secondary | ICD-10-CM

## 2020-06-10 DIAGNOSIS — F419 Anxiety disorder, unspecified: Secondary | ICD-10-CM

## 2020-06-10 NOTE — Telephone Encounter (Signed)
  Chronic Care Management    Clinical Social Work General Follow Up Note  06/10/2020 Name: Candace Tapia MRN: [id number] DOB: 13-Aug-1935  Candace Tapia is a 85 y.o. year old female who is a primary care patient of Olin Hauser, DO. The CCM team was consulted for assistance with Level of Care Concerns.   Review of patient status, including review of consultants reports, relevant laboratory and other test results, and collaboration with appropriate care team members and the patient's provider was performed as part of comprehensive patient evaluation and provision of chronic care management services.    LCSW completed CCM outreach attempt today but was unable to reach patient successfully. A HIPPA compliant voice message was left encouraging patient to return call once available. LCSW will reschedule patient's CCM Social Work appointment if no return call has been made.  A HIPPA compliant phone message was left for the patient providing contact information and requesting a return call.   Outpatient Encounter Medications as of 06/10/2020  Medication Sig Note  . acetaminophen (TYLENOL) 650 MG CR tablet Take 650 mg by mouth every 8 (eight) hours as needed for pain.   Marland Kitchen albuterol (PROAIR HFA) 108 (90 BASE) MCG/ACT inhaler Inhale 1 puff into the lungs every 6 (six) hours as needed.    Marland Kitchen amLODipine (NORVASC) 5 MG tablet TAKE 1 TABLET(5 MG) BY MOUTH DAILY   . aspirin 81 MG chewable tablet Chew 81 mg by mouth daily as needed.  01/01/2019: Takes every once in a while  . budesonide-formoterol (SYMBICORT) 160-4.5 MCG/ACT inhaler Inhale 2 puffs into the lungs 2 (two) times daily.   . chlorthalidone (HYGROTON) 25 MG tablet TAKE 1 TABLET(25 MG) BY MOUTH DAILY   . Cholecalciferol (KP VITAMIN D3) 25 MCG (1000 UT) capsule Vitamin D3 25 mcg (1,000 unit) capsule  1 po qd   . fluticasone (FLONASE) 50 MCG/ACT nasal spray Place 1 spray into both nostrils as needed.    . Fluticasone-Umeclidin-Vilant  (TRELEGY ELLIPTA) 100-62.5-25 MCG/INH AEPB Inhale 1 puff into the lungs daily.   Marland Kitchen gabapentin (NEURONTIN) 400 MG capsule TAKE 1 CAPSULE(400 MG) BY MOUTH TWICE DAILY   . losartan (COZAAR) 100 MG tablet TAKE 1 TABLET(100 MG) BY MOUTH DAILY   . meclizine (ANTIVERT) 25 MG tablet Take 0.5 tablets (12.5 mg total) by mouth every 8 (eight) hours as needed for dizziness.   . Multiple Vitamins-Minerals (MULTIVITAMIN WOMEN 50+) TABS Take 1 tablet by mouth daily.    Marland Kitchen NEXIUM 20 MG capsule Take 1 capsule (20 mg total) by mouth daily.   . potassium chloride (MICRO-K) 10 MEQ CR capsule TAKE 1 CAPSULE BY MOUTH DAILY. MAY OPEN CAPSULE AND SPRINKLE OVER SOFT FOOD OR SWALLOW WHOLE   . Spacer/Aero-Holding Chambers (AEROCHAMBER PLUS) inhaler Use as instructed   . [DISCONTINUED] fexofenadine (ALLEGRA ALLERGY) 180 MG tablet Take 180 mg by mouth as needed. Reported on 09/29/2015    No facility-administered encounter medications on file as of 06/10/2020.    Follow Up Plan: SW will follow up with patient by phone over the next quarter   [name], [place], MSW, [email or web address] Phone: [phone]

## 2020-06-10 NOTE — Chronic Care Management (AMB) (Signed)
Chronic Care Management    Clinical Social Work Note  06/10/2020 Name: Candace Tapia MRN: [id number] DOB: 17-Nov-1935  Candace Tapia is a 85 y.o. year old female who is a primary care patient of Candace Hauser, DO. The CCM team was consulted to assist the patient with chronic disease management and/or care coordination needs related to: Level of Care Concerns.   Engaged with patient by telephone for follow up visit in response to provider referral for social work chronic care management and care coordination services.   Consent to Services:  The patient was given the following information about Chronic Care Management services today, agreed to services, and gave verbal consent: 1. CCM service includes personalized support from designated clinical staff supervised by the primary care provider, including individualized plan of care and coordination with other care providers 2. 24/7 contact phone numbers for assistance for urgent and routine care needs. 3. Service will only be billed when office clinical staff spend 20 minutes or more in a month to coordinate care. 4. Only one practitioner may furnish and bill the service in a calendar month. 5.The patient may stop CCM services at any time (effective at the end of the month) by phone call to the office staff. 6. The patient will be responsible for cost sharing (co-pay) of up to 20% of the service fee (after annual deductible is met). Patient agreed to services and consent obtained.  Patient agreed to services and consent obtained.   Assessment: Review of patient past medical history, allergies, medications, and health status, including review of relevant consultants reports was performed today as part of a comprehensive evaluation and provision of chronic care management and care coordination services.     SDOH (Social Determinants of Health) assessments and interventions performed:    Advanced Directives Status: See Care Plan for  related entries.  CCM Care Plan  Allergies  Allergen Reactions  . Tramadol Hcl Itching    Per pt it caused me to itch all over  . Statins     Not sure what exact med was; just made her feel bad (can't elaborate)  . Penicillin G Rash  . Penicillins Rash    Has patient had a PCN reaction causing immediate rash, facial/tongue/throat swelling, SOB or lightheadedness with hypotension: No Has patient had a PCN reaction causing severe rash involving mucus membranes or skin necrosis: No Has patient had a PCN reaction that required hospitalization: No Has patient had a PCN reaction occurring within the last 10 years: No If all of the above answers are "NO", then may proceed with Cephalosporin use.  . Sulfa Antibiotics Other (See Comments) and Rash    Outpatient Encounter Medications as of 06/10/2020  Medication Sig Note  . acetaminophen (TYLENOL) 650 MG CR tablet Take 650 mg by mouth every 8 (eight) hours as needed for pain.   Marland Kitchen albuterol (PROAIR HFA) 108 (90 BASE) MCG/ACT inhaler Inhale 1 puff into the lungs every 6 (six) hours as needed.    Marland Kitchen amLODipine (NORVASC) 5 MG tablet TAKE 1 TABLET(5 MG) BY MOUTH DAILY   . aspirin 81 MG chewable tablet Chew 81 mg by mouth daily as needed.  01/01/2019: Takes every once in a while  . budesonide-formoterol (SYMBICORT) 160-4.5 MCG/ACT inhaler Inhale 2 puffs into the lungs 2 (two) times daily.   . chlorthalidone (HYGROTON) 25 MG tablet TAKE 1 TABLET(25 MG) BY MOUTH DAILY   . Cholecalciferol (KP VITAMIN D3) 25 MCG (1000 UT) capsule Vitamin D3 25 mcg (  1,000 unit) capsule  1 po qd   . fluticasone (FLONASE) 50 MCG/ACT nasal spray Place 1 spray into both nostrils as needed.    . Fluticasone-Umeclidin-Vilant (TRELEGY ELLIPTA) 100-62.5-25 MCG/INH AEPB Inhale 1 puff into the lungs daily.   Marland Kitchen gabapentin (NEURONTIN) 400 MG capsule TAKE 1 CAPSULE(400 MG) BY MOUTH TWICE DAILY   . losartan (COZAAR) 100 MG tablet TAKE 1 TABLET(100 MG) BY MOUTH DAILY   . meclizine  (ANTIVERT) 25 MG tablet Take 0.5 tablets (12.5 mg total) by mouth every 8 (eight) hours as needed for dizziness.   . Multiple Vitamins-Minerals (MULTIVITAMIN WOMEN 50+) TABS Take 1 tablet by mouth daily.    Marland Kitchen NEXIUM 20 MG capsule Take 1 capsule (20 mg total) by mouth daily.   . potassium chloride (MICRO-K) 10 MEQ CR capsule TAKE 1 CAPSULE BY MOUTH DAILY. MAY OPEN CAPSULE AND SPRINKLE OVER SOFT FOOD OR SWALLOW WHOLE   . Spacer/Aero-Holding Chambers (AEROCHAMBER PLUS) inhaler Use as instructed   . [DISCONTINUED] fexofenadine (ALLEGRA ALLERGY) 180 MG tablet Take 180 mg by mouth as needed. Reported on 09/29/2015    No facility-administered encounter medications on file as of 06/10/2020.    Patient Active Problem List   Diagnosis Date Noted  . Obesity (BMI 35.0-39.9 without comorbidity) 01/09/2019  . Peripheral polyneuropathy 12/27/2017  . Vestibular disorder 03/20/2017  . CKD (chronic kidney disease), stage II 12/09/2016  . Chronic venous insufficiency 11/25/2016  . Bilateral lower extremity edema 10/27/2016  . Pre-diabetes 08/12/2016  . Carotid bruit 02/09/2016  . History of left breast cancer 02/09/2016  . Long term current use of opiate analgesic 02/08/2016  . Long term prescription opiate use 02/08/2016  . Drug level above therapeutic range 02/08/2016  . Chronic low back pain (Location of Tertiary source of pain) (Bilateral) (R>L) 02/08/2016  . Chronic knee pain (Location of Secondary source of pain) (Bilateral) (L>R) 02/08/2016  . History bilateral shoulder replacement 02/08/2016  . Chronic hip pain (Location of Primary Source of Pain) (Bilateral) (R>L) 01/07/2016  . Complete rupture of rotator cuff 01/07/2016  . Hyperlipidemia 01/07/2016  . Osteopenia 01/07/2016  . Centrilobular emphysema ([place]) 07/21/2015  . DDD (degenerative disc disease), lumbar 03/05/2015  . Lumbar facet syndrome (Bilateral) (R>L) 03/05/2015  . Sacroiliac joint dysfunction 03/05/2015  . Osteoarthritis of hip  (Bilateral) (R>L) 03/05/2015  . DJD of shoulder 03/05/2015  . Abnormal finding on mammography 03/03/2015  . Back pain with radiation 03/03/2015  . Major depression in full remission ([place]) 03/03/2015  . Dry skin 03/03/2015  . Hypokalemia 03/03/2015  . Drug-induced myopathy 03/03/2015  . Gastro-esophageal reflux disease without esophagitis 03/03/2015  . Allergic rhinitis, seasonal 03/03/2015  . Osteoarthritis of multiple joints 03/03/2015  . Benign hypertension with CKD (chronic kidney disease), stage II 11/28/2014  . Chronic pain 11/28/2014  . Vitamin D deficiency 11/28/2014  . Airway hyperreactivity 09/18/2013  . Microscopic hematuria 12/20/2011    Care Plan : General Social Work (Adult)  Updates made by Greg Cutter, LCSW since 06/10/2020 12:00 AM    Problem: Quality of Life (General Plan of Care)     Long-Range Goal: Quality of Life Maintained   Start Date: 06/10/2020  Priority: Medium  Note:   Evidence-based guidance:   Assess patient's thoughts about quality of life, goals and expectations, and dissatisfaction or desire to improve.   Identify issues of primary importance such as mental health, illness, exercise tolerance, pain, sexual function and intimacy, cognitive change, social isolation, finances and relationships.   Assess and monitor  for signs/symptoms of psychosocial concerns, especially depression or ideations regarding harm to others or self; provide or refer for mental health services as needed.   Identify sensory issues that impact quality of life such as hearing loss, vision deficit; strategize ways to maintain or improve hearing, vision.   Promote access to services in the community to support independence such as support groups, home visiting programs, financial assistance, handicapped parking tags, durable medical equipment and emergency responder.   Promote activities to decrease social isolation such as group support or social, leisure and recreational  activities, employment, use of social media; consider safety concerns about being out of home for activities.   Provide patient an opportunity to share by storytelling or a "life review" to give positive meaning to life and to assist with coping and negative experiences.   Encourage patient to tap into hope to improve sense of self.   Counsel based on prognosis and as early as possible about end-of-life and palliative care; consider referral to palliative care provider.   Advocate for the development of palliative care plan that may include avoidance of unnecessary testing and intervention, symptom control, discontinuation of medications, hospice and organ donation.   Counsel as early as possible those with life-limiting chronic disease about palliative care; consider referral to palliative care provider.   Advocate for the development of palliative care plan.   Notes:   Timeframe:  Long-Range Goal Priority:  Medium Start Date: 06/10/20                          Expected End Date: 09/07/20                Follow Up Date- 07/08/20   - avoid negative self-talk - develop a personal safety plan - develop a plan to deal with triggers like holidays, anniversaries - exercise at least 2 to 3 times per week - have a plan for how to handle bad days - journal feelings and what helps to feel better or worse - spend time or talk with others at least 2 to 3 times per week - spend time or talk with others every day - watch for early signs of feeling worse - write in journal every day    Why is this important?    Keeping track of your progress will help your treatment team find the right mix of medicine and therapy for you.   Write in your journal every day.   Day-to-day changes in depression symptoms are normal. It may be more helpful to check your progress at the end of each week instead of every day.     Current Barriers:  . Financial constraints related to affording in home support or  eventual long term care placement . Limited social support . Level of care concerns . ADL IADL limitations . Limited access to caregiver . Inability to perform ADL's independently . Inability to perform IADL's independently . Ongoing depression  Clinical Social Work Clinical Goal(s):  Marland Kitchen Over the next 120 days, patient will work with SW to address concerns related to increasing depression and anxiety management and gaining additional support within the home until patient wishes to pursue LTC placement  Interventions: . Patient interviewed and appropriate assessments performed . UPDATE- CCM LCSW was unable to reach patient but received return call form her later in the day. CCM LCSW spoke with both patient and son. Provided patient and family with information about long term care placement (  ALF) and payment sources-out of pocket or Medicaid. Per son, they have completed several tours of local ALF's but patient declined wanting to move further in this process at this time. LCSW also provided education on personal care service resources within the area. CCM LCSW has already placed patient on the wait list for [facility]. Son is agreeable to contact program to see where she is at on their wait list.  . Discussed plans with patient for ongoing care management follow up and provided patient with direct contact information for care management team . Advised patient to consider finding in home support. Patient found an aide that cost $20 per hour but is not able to afford this service. LCSW encouraged patient to network to see if she can find someone at a lesser rate. . Patient admits ongoing loneliness and depression. LCSW provided active and reflective listening throughout conversation. Patient shares that she puts on music when she is experiencing symptoms of depression and it helps. She was educated on other coping skills to implement into her daily routine to combat her depression. Patient was receptive  to emotional support and healthy self-care education that was provided.  . Patient does NOT want CCM LCSW to make referral for psychiatry or counseling. She shares that she does not want medication management for her depression as she has tried medications in the past and they only made her worse. She was strongly encouraged to consider implementing counseling but patient declined wanting this referral.  . Assisted patient/caregiver with obtaining information about health plan benefits . Provided education and assistance to client regarding Advanced Directives. . Provided education to patient/caregiver regarding level of care options. Marland Kitchen LCSW successfully coordinated care with Home Care Providers and placed patient on the wait list for [facility] program on 06/20/19. Patient was advised to contact program to inquire about where she is on the wait list currently. . Patient reports that her daughter in law continues to get her groceries for her and provides transportation for her medical appointments. She has two sons that assist with her care. She reports that her family visits her usually once per week on Sundays. . Patient shares that her arthritis and COPD have flared up recently. Coping skill education provided for pain management.  . Patient reports that she is having recent difficulty with hearing over the phone even with her hearing aides.  Patient Self Care Activities:  . Self administers medications as prescribed . Attends all scheduled provider appointments . Unable to independently take care of self within the home and is in need of additional personal care service involvement.   Please see past updates related to this goal by clicking on the "Past Updates" button in the selected goal     Task: Support and Maintain Acceptable Degree of Health, Comfort and Happiness   Note:   Care Management Activities:    - affirmation provided - community involvement promoted - expression of thoughts  about present/future encouraged - independence in all possible areas promoted - life review by storytelling encouraged - patient strengths promoted - psychosocial concerns monitored - self-expression encouraged - sleep diary encouraged - sleep hygiene techniques encouraged - social relationships promoted - strategies to maintain hearing and/or vision promoted - strategies to maintain intimacy promoted - wellness behaviors promoted    Notes:       Follow Up Plan: SW will follow up with patient by phone over the next month      [name], BSW, MSW, [place]  [email or web address] Phone: [phone]

## 2020-06-11 ENCOUNTER — Telehealth: Payer: Medicare Other

## 2020-06-20 DIAGNOSIS — J449 Chronic obstructive pulmonary disease, unspecified: Secondary | ICD-10-CM | POA: Diagnosis not present

## 2020-06-22 ENCOUNTER — Ambulatory Visit (INDEPENDENT_AMBULATORY_CARE_PROVIDER_SITE_OTHER): Payer: Medicare Other | Admitting: Pharmacist

## 2020-06-22 DIAGNOSIS — F3342 Major depressive disorder, recurrent, in full remission: Secondary | ICD-10-CM

## 2020-06-22 DIAGNOSIS — J449 Chronic obstructive pulmonary disease, unspecified: Secondary | ICD-10-CM

## 2020-06-22 NOTE — Patient Instructions (Signed)
Visit Information  PATIENT GOALS: Goals Addressed            This Visit's Progress   . Pharmacy Goals       Please remember to use your Trelegy inhaler daily as directed and rinse out your mouth after each use.  Please check your home blood pressure, keep a log of the results and bring this with you to your medical appointments.   Feel free to call me with any questions or concerns. I look forward to our next call!  Harlow Asa, PharmD, [place] [phone]       The patient verbalized understanding of instructions, educational materials, and care plan provided today and declined offer to receive copy of patient instructions, educational materials, and care plan.   Telephone follow up appointment with care management team member scheduled for:  5/16 at 11:15 am

## 2020-06-22 NOTE — Chronic Care Management (AMB) (Signed)
Chronic Care Management Pharmacy Note  06/22/2020 Name:  Candace Tapia MRN:  [id number] DOB:  [birth year]  Subjective: Candace Tapia is an 85 y.o. year old female who is a primary patient of Olin Hauser, DO.  The CCM team was consulted for assistance with disease management and care coordination needs.    Engaged with patient by telephone for follow up visit in response to provider referral for pharmacy case management and/or care coordination services.   Consent to Services:  The patient was given information about Chronic Care Management services, agreed to services, and gave verbal consent prior to initiation of services.  Please see initial visit note for detailed documentation.   Patient Care Team: Olin Hauser, DO as PCP - General (Family Medicine) Erby Pian, MD as Referring Physician (Specialist) Lequita Asal, MD as Referring Physician (Hematology and Oncology) Greg Cutter, LCSW as Social Worker (Licensed Clinical Social Worker) Winfield Cunas, Virl Diamond, [place] as Pharmacist Vanita Ingles, RN as Case Manager (General Practice)  Recent office visits: Office Visit with PCP on 11/15 for Annual Physical  Recent consult visits: Office Visit with Pulmonologist on 1/25 for follow up  Hospital visits: None in previous 6 months  Objective:  Lab Results  Component Value Date   CREATININE 0.80 02/21/2020   CREATININE 0.78 01/02/2019   CREATININE 0.90 10/02/2018    Lab Results  Component Value Date   HGBA1C 5.6 02/21/2020    Hepatic Function Latest Ref Rng & Units 02/21/2020 01/02/2019 10/02/2018  Total Protein 6.1 - 8.1 g/dL 6.6 6.3 7.1  Albumin 3.5 - 5.0 g/dL - - 3.7  AST 10 - 35 U/L _0 ALT 6 - 29 U/L _1 Alk Phosphatase 38 - 126 U/L - - 73  Total Bilirubin 0.2 - 1.2 mg/dL 0.5 0.4 0.4    Lab Results  Component Value Date/Time   TSH 3.51 02/21/2020 08:31 AM   TSH 1.62 01/02/2019 08:20 AM    CBC Latest Ref  Rng & Units 02/21/2020 01/02/2019 10/02/2018  WBC 3.8 - 10.8 Thousand/uL 8.1 7.0 8.9  Hemoglobin 11.7 - 15.5 g/dL 13.2 12.6 13.1  Hematocrit 35.0 - 45.0 % 39.8 37.8 41.4  Platelets 140 - 400 Thousand/uL 294 253 290    Lab Results  Component Value Date/Time   VD25OH 26 (L) 02/21/2020 08:31 AM   VD25OH 38 12/05/2016 12:07 PM    Social History   Tobacco Use  Smoking Status Former Smoker  . Packs/day: 2.00  . Years: 40.00  . Pack years: 80.00  . Quit date: 04/19/1993  . Years since quitting: 27.1  Smokeless Tobacco Never Used   BP Readings from Last 3 Encounters:  03/02/20 (!) 127/52  10/16/19 (!) 122/56  10/02/19 137/70   Pulse Readings from Last 3 Encounters:  03/02/20 86  10/16/19 94  08/18/19 95   Wt Readings from Last 3 Encounters:  03/02/20 220 lb (99.8 kg)  10/16/19 216 lb (98 kg)  08/18/19 220 lb (99.8 kg)    Assessment: Review of patient past medical history, allergies, medications, health status, including review of consultants reports, laboratory and other test data, was performed as part of comprehensive evaluation and provision of chronic care management services.   SDOH:  (Social Determinants of Health) assessments and interventions performed: none   CCM Care Plan  Allergies  Allergen Reactions  . Tramadol Hcl Itching    Per pt it caused me to itch all over  .  Statins     Not sure what exact med was; just made her feel bad (can't elaborate)  . Penicillin G Rash  . Penicillins Rash    Has patient had a PCN reaction causing immediate rash, facial/tongue/throat swelling, SOB or lightheadedness with hypotension: No Has patient had a PCN reaction causing severe rash involving mucus membranes or skin necrosis: No Has patient had a PCN reaction that required hospitalization: No Has patient had a PCN reaction occurring within the last 10 years: No If all of the above answers are "NO", then may proceed with Cephalosporin use.  . Sulfa Antibiotics Other (See  Comments) and Rash    Medications Reviewed Today    Reviewed by Vella Raring, [place] (Pharmacist) on 06/22/20 at 1032  Med List Status: <None>  Medication Order Taking? Sig Documenting Provider Last Dose Status Informant  acetaminophen (TYLENOL) 650 MG CR tablet [id number]  Take 650 mg by mouth every 8 (eight) hours as needed for pain. [provider]  Active Self  albuterol (VENTOLIN HFA) 108 (90 Base) MCG/ACT inhaler [id number] Yes Inhale 1 puff into the lungs every 6 (six) hours as needed.  [provider] Taking Active Self           Med Note Fabio Bering   Sat May 13, 2017 10:07 AM)    amLODipine (NORVASC) 5 MG tablet [id number]  TAKE 1 TABLET(5 MG) BY MOUTH DAILY Olin Hauser, DO  Active   aspirin 81 MG chewable tablet [id number]  Chew 81 mg by mouth daily as needed.  [provider]  Active            Med Note (HILL, TIFFANY A   Tue Jan 01, 2019  3:30 PM) Takes every once in a while  chlorthalidone (HYGROTON) 25 MG tablet [id number]  TAKE 1 TABLET(25 MG) BY MOUTH DAILY Karamalegos, Devonne Doughty, DO  Active   Cholecalciferol (KP VITAMIN D3) 25 MCG (1000 UT) capsule [id number]  Vitamin D3 25 mcg (1,000 unit) capsule  1 po qd [provider]  Active         Discontinued 08/18/19 1134            Med Note Ulla Potash S   Sat May 13, 2017 10:03 AM)    fluticasone (FLONASE) 50 MCG/ACT nasal spray [id number]  Place 1 spray into both nostrils as needed.  [provider]  Active Self  Fluticasone-Umeclidin-Vilant (TRELEGY ELLIPTA) 100-62.5-25 MCG/INH AEPB [id number] Yes Inhale 1 puff into the lungs daily. [provider] Taking Active   gabapentin (NEURONTIN) 400 MG capsule [id number]  TAKE 1 CAPSULE(400 MG) BY MOUTH TWICE DAILY Olin Hauser, DO  Active   losartan (COZAAR) 100 MG tablet [id number]  TAKE 1 TABLET(100 MG) BY MOUTH DAILY Karamalegos, Devonne Doughty, DO  Active   meclizine (ANTIVERT) 25 MG tablet  [id number]  Take 0.5 tablets (12.5 mg total) by mouth every 8 (eight) hours as needed for dizziness. Karamalegos, Devonne Doughty, DO  Active   Multiple Vitamins-Minerals (MULTIVITAMIN WOMEN 50+) TABS [id number]  Take 1 tablet by mouth daily.  [provider]  Active Self  NEXIUM 20 MG capsule [id number]  Take 1 capsule (20 mg total) by mouth daily. Arlis Porta., MD  Active Self  potassium chloride (MICRO-K) 10 MEQ CR capsule [id number]  TAKE 1 CAPSULE BY MOUTH DAILY. MAY OPEN CAPSULE AND SPRINKLE OVER SOFT FOOD OR SWALLOW WHOLE Karamalegos, Devonne Doughty, DO  Active   Spacer/Aero-Holding Chambers (AEROCHAMBER PLUS) inhaler  [id number]  Use as instructed Melynda Ripple, MD  Active   Med List Note Ignatius Specking, RN 02/08/16 1505): UDS 02/08/16 meds due 03/09/16          Patient Active Problem List   Diagnosis Date Noted  . Obesity (BMI 35.0-39.9 without comorbidity) 01/09/2019  . Peripheral polyneuropathy 12/27/2017  . Vestibular disorder 03/20/2017  . CKD (chronic kidney disease), stage II 12/09/2016  . Chronic venous insufficiency 11/25/2016  . Bilateral lower extremity edema 10/27/2016  . Pre-diabetes 08/12/2016  . Carotid bruit 02/09/2016  . History of left breast cancer 02/09/2016  . Long term current use of opiate analgesic 02/08/2016  . Long term prescription opiate use 02/08/2016  . Drug level above therapeutic range 02/08/2016  . Chronic low back pain (Location of Tertiary source of pain) (Bilateral) (R>L) 02/08/2016  . Chronic knee pain (Location of Secondary source of pain) (Bilateral) (L>R) 02/08/2016  . History bilateral shoulder replacement 02/08/2016  . Chronic hip pain (Location of Primary Source of Pain) (Bilateral) (R>L) 01/07/2016  . Complete rupture of rotator cuff 01/07/2016  . Hyperlipidemia 01/07/2016  . Osteopenia 01/07/2016  . Centrilobular emphysema ([place]) 07/21/2015  . DDD (degenerative disc disease), lumbar 03/05/2015  . Lumbar facet syndrome  (Bilateral) (R>L) 03/05/2015  . Sacroiliac joint dysfunction 03/05/2015  . Osteoarthritis of hip (Bilateral) (R>L) 03/05/2015  . DJD of shoulder 03/05/2015  . Abnormal finding on mammography 03/03/2015  . Back pain with radiation 03/03/2015  . Major depression in full remission ([place]) 03/03/2015  . Dry skin 03/03/2015  . Hypokalemia 03/03/2015  . Drug-induced myopathy 03/03/2015  . Gastro-esophageal reflux disease without esophagitis 03/03/2015  . Allergic rhinitis, seasonal 03/03/2015  . Osteoarthritis of multiple joints 03/03/2015  . Benign hypertension with CKD (chronic kidney disease), stage II 11/28/2014  . Chronic pain 11/28/2014  . Vitamin D deficiency 11/28/2014  . Airway hyperreactivity 09/18/2013  . Microscopic hematuria 12/20/2011    Immunization History  Administered Date(s) Administered  . Fluad Quad(high Dose 65+) 01/01/2019, 03/02/2020  . Influenza, High Dose Seasonal PF 02/24/2015, 02/09/2016, 12/12/2016, 01/23/2018  . Influenza-Unspecified 01/16/2014  . Pneumococcal Conjugate-13 08/29/2014  . Pneumococcal Polysaccharide-23 04/19/2007  . Tdap 01/17/2013  . Zoster 08/29/2014    Conditions to be addressed/monitored: HTN COPD and Vitamin D deficiency  Care Plan : PharmD - Med Mgmt  Updates made by Vella Raring, RPH since 06/22/2020 12:00 AM    Problem: Disease Progression     Long-Range Goal: Disease Progression Prevented or Minimized   Start Date: 06/22/2020  Expected End Date: 09/20/2020  This Visit's Progress: On track  Priority: High  Note:   Current Barriers:  Marland Kitchen Knowledge deficits related to basic COPD self care/management . Knowledge deficit related to basic understanding of how to use inhalers and how inhaled medications work   . Limited Social Support . Financial limitations  Pharmacist Clinical Goal(s):  Marland Kitchen Over the next 90 days, patient will achieve adherence to monitoring guidelines and medication adherence to achieve therapeutic efficacy  through collaboration with PharmD and provider.   Interventions: . 1:1 collaboration with Olin Hauser, DO regarding development and update of comprehensive plan of care as evidenced by provider attestation and co-signature . Inter-disciplinary care team collaboration (see longitudinal plan of care) . Perform chart review o Office Visit with PCP on 11/15 for Annual Physical o Office Visit with Pulmonologist on 1/25. Provider advised patient to: - Continue albuterol prn, trelegy one puff q day  - Nocturnal oxygen at 2 liters  Hypertension: .  Current treatment: o Amlodipine 5 mg daily o Chlorthalidone 25 mg daily o Losartan 100 mg daily . Patient unable to locate home blood pressure log today . Encourage patient to continue to check home BP, keeping log and bring log with her to medical appointments  Chronic Obstructive Pulmonary Disease: . Patient followed by [facility] Pulmonology for management of COPD . Current treatment: o Trelegy - 1 puff daily o Albuterol as needed o Nocturnal oxygen . Again counsel on importance of using maintenance inhaler (Trelegy) daily as directed and rescue (albuerol) inhaler as needed  Medication Adherence: . Reports using weekly pillbox as directed o Reports not currently able to incorporate Vitamin D into pillbox due to lack of room, but that she keeps bottle next to pillbox to take as directed  o Discuss option of obtaining weekly pillbox with larger slots  Medication Assistance: . Reports paid higher cost for refill of Trelegy at beginning of calendar year due to deductible, but that copayment is also difficult to afford . Counsel patient on requirements of Trelegy patient assistance program including out of pocket expenditure requirement . Patient reports currently has sufficient supply of Trelegy, including several sample inhalers as received from Pulmonology, but will discuss patient assistance further during next appointment  with CM Pharmacist  Patient Goals/Self-Care Activities . Over the next 90 days, patient will:  . Attends all scheduled provider appointments . Calls pharmacy for medication refills . Calls provider office for new concerns or questions  Follow Up Plan: Telephone follow up appointment with care management team member scheduled for: 5/16 at 11:15 am     Medication Assistance: Plan to discuss again during next telephone appointment  Patient's preferred pharmacy is:  [facility] [address], [place] [address] Phone: [phone] Fax: [place], [place] [address] [address] Phone: [phone] Fax: [place], [place] [address] Phone: [phone] Fax: [phone]  Uses pill box? Yes  Follow Up:  Patient agrees to Care Plan and Follow-up.  Plan: Telephone follow up appointment with care management team member scheduled for:  5/16 at 11:15 am  Harlow Asa, PharmD, [place] [phone]

## 2020-07-08 ENCOUNTER — Ambulatory Visit: Payer: Medicare Other | Admitting: Licensed Clinical Social Worker

## 2020-07-08 DIAGNOSIS — J449 Chronic obstructive pulmonary disease, unspecified: Secondary | ICD-10-CM

## 2020-07-08 DIAGNOSIS — F3342 Major depressive disorder, recurrent, in full remission: Secondary | ICD-10-CM | POA: Diagnosis not present

## 2020-07-08 DIAGNOSIS — I10 Essential (primary) hypertension: Secondary | ICD-10-CM | POA: Diagnosis not present

## 2020-07-08 DIAGNOSIS — E782 Mixed hyperlipidemia: Secondary | ICD-10-CM | POA: Diagnosis not present

## 2020-07-08 DIAGNOSIS — F419 Anxiety disorder, unspecified: Secondary | ICD-10-CM

## 2020-07-08 NOTE — Chronic Care Management (AMB) (Signed)
Chronic Care Management    Clinical Social Work Note  07/08/2020 Name: Candace Tapia MRN: [id number] DOB: Dec 22, 1935  Candace Tapia is a 85 y.o. year old female who is a primary care patient of Olin Hauser, DO. The CCM team was consulted to assist the patient with chronic disease management and/or care coordination needs related to: Level of Care Concerns and Mental Health Counseling and Resources.   Engaged with patient by telephone for follow up visit in response to provider referral for social work chronic care management and care coordination services.   Consent to Services:  The patient was given the following information about Chronic Care Management services today, agreed to services, and gave verbal consent: 1. CCM service includes personalized support from designated clinical staff supervised by the primary care provider, including individualized plan of care and coordination with other care providers 2. 24/7 contact phone numbers for assistance for urgent and routine care needs. 3. Service will only be billed when office clinical staff spend 20 minutes or more in a month to coordinate care. 4. Only one practitioner may furnish and bill the service in a calendar month. 5.The patient may stop CCM services at any time (effective at the end of the month) by phone call to the office staff. 6. The patient will be responsible for cost sharing (co-pay) of up to 20% of the service fee (after annual deductible is met). Patient agreed to services and consent obtained.  Patient agreed to services and consent obtained.   Assessment: Review of patient past medical history, allergies, medications, and health status, including review of relevant consultants reports was performed today as part of a comprehensive evaluation and provision of chronic care management and care coordination services.     SDOH (Social Determinants of Health) assessments and interventions performed:     Advanced Directives Status: Not addressed in this encounter.  CCM Care Plan  Allergies  Allergen Reactions  . Tramadol Hcl Itching    Per pt it caused me to itch all over  . Statins     Not sure what exact med was; just made her feel bad (can't elaborate)  . Penicillin G Rash  . Penicillins Rash    Has patient had a PCN reaction causing immediate rash, facial/tongue/throat swelling, SOB or lightheadedness with hypotension: No Has patient had a PCN reaction causing severe rash involving mucus membranes or skin necrosis: No Has patient had a PCN reaction that required hospitalization: No Has patient had a PCN reaction occurring within the last 10 years: No If all of the above answers are "NO", then may proceed with Cephalosporin use.  . Sulfa Antibiotics Other (See Comments) and Rash    Outpatient Encounter Medications as of 07/08/2020  Medication Sig Note  . acetaminophen (TYLENOL) 650 MG CR tablet Take 650 mg by mouth every 8 (eight) hours as needed for pain.   Marland Kitchen albuterol (VENTOLIN HFA) 108 (90 Base) MCG/ACT inhaler Inhale 1 puff into the lungs every 6 (six) hours as needed.    Marland Kitchen amLODipine (NORVASC) 5 MG tablet TAKE 1 TABLET(5 MG) BY MOUTH DAILY   . aspirin 81 MG chewable tablet Chew 81 mg by mouth daily as needed.  01/01/2019: Takes every once in a while  . chlorthalidone (HYGROTON) 25 MG tablet TAKE 1 TABLET(25 MG) BY MOUTH DAILY   . Cholecalciferol 25 MCG (1000 UT) capsule Vitamin D3 25 mcg (1,000 unit) capsule  1 po qd   . fluticasone (FLONASE) 50 MCG/ACT nasal spray  Place 1 spray into both nostrils as needed.    . Fluticasone-Umeclidin-Vilant (TRELEGY ELLIPTA) 100-62.5-25 MCG/INH AEPB Inhale 1 puff into the lungs daily.   Marland Kitchen gabapentin (NEURONTIN) 400 MG capsule TAKE 1 CAPSULE(400 MG) BY MOUTH TWICE DAILY   . losartan (COZAAR) 100 MG tablet TAKE 1 TABLET(100 MG) BY MOUTH DAILY   . meclizine (ANTIVERT) 25 MG tablet Take 0.5 tablets (12.5 mg total) by mouth every 8 (eight)  hours as needed for dizziness.   . Multiple Vitamins-Minerals (MULTIVITAMIN WOMEN 50+) TABS Take 1 tablet by mouth daily.    Marland Kitchen NEXIUM 20 MG capsule Take 1 capsule (20 mg total) by mouth daily.   . potassium chloride (MICRO-K) 10 MEQ CR capsule TAKE 1 CAPSULE BY MOUTH DAILY. MAY OPEN CAPSULE AND SPRINKLE OVER SOFT FOOD OR SWALLOW WHOLE   . Spacer/Aero-Holding Chambers (AEROCHAMBER PLUS) inhaler Use as instructed   . [DISCONTINUED] fexofenadine (ALLEGRA ALLERGY) 180 MG tablet Take 180 mg by mouth as needed. Reported on 09/29/2015    No facility-administered encounter medications on file as of 07/08/2020.    Patient Active Problem List   Diagnosis Date Noted  . Obesity (BMI 35.0-39.9 without comorbidity) 01/09/2019  . Peripheral polyneuropathy 12/27/2017  . Vestibular disorder 03/20/2017  . CKD (chronic kidney disease), stage II 12/09/2016  . Chronic venous insufficiency 11/25/2016  . Bilateral lower extremity edema 10/27/2016  . Pre-diabetes 08/12/2016  . Carotid bruit 02/09/2016  . History of left breast cancer 02/09/2016  . Long term current use of opiate analgesic 02/08/2016  . Long term prescription opiate use 02/08/2016  . Drug level above therapeutic range 02/08/2016  . Chronic low back pain (Location of Tertiary source of pain) (Bilateral) (R>L) 02/08/2016  . Chronic knee pain (Location of Secondary source of pain) (Bilateral) (L>R) 02/08/2016  . History bilateral shoulder replacement 02/08/2016  . Chronic hip pain (Location of Primary Source of Pain) (Bilateral) (R>L) 01/07/2016  . Complete rupture of rotator cuff 01/07/2016  . Hyperlipidemia 01/07/2016  . Osteopenia 01/07/2016  . Centrilobular emphysema ([place]) 07/21/2015  . DDD (degenerative disc disease), lumbar 03/05/2015  . Lumbar facet syndrome (Bilateral) (R>L) 03/05/2015  . Sacroiliac joint dysfunction 03/05/2015  . Osteoarthritis of hip (Bilateral) (R>L) 03/05/2015  . DJD of shoulder 03/05/2015  . Abnormal finding on  mammography 03/03/2015  . Back pain with radiation 03/03/2015  . Major depression in full remission ([place]) 03/03/2015  . Dry skin 03/03/2015  . Hypokalemia 03/03/2015  . Drug-induced myopathy 03/03/2015  . Gastro-esophageal reflux disease without esophagitis 03/03/2015  . Allergic rhinitis, seasonal 03/03/2015  . Osteoarthritis of multiple joints 03/03/2015  . Benign hypertension with CKD (chronic kidney disease), stage II 11/28/2014  . Chronic pain 11/28/2014  . Vitamin D deficiency 11/28/2014  . Airway hyperreactivity 09/18/2013  . Microscopic hematuria 12/20/2011    Conditions to be addressed/monitored: Depression; Level of care concerns, Mental Health Concerns  and Limited access to caregiver  Care Plan : General Social Work (Adult)  Updates made by Greg Cutter, LCSW since 07/08/2020 12:00 AM    Problem: Quality of Life (General Plan of Care)     Long-Range Goal: Quality of Life Maintained   Start Date: 07/08/2020  Priority: Medium  Note:     Timeframe:  Long-Range Goal Priority:  Medium Start Date 3/23/222                        Expected End Date: 10/08/20  Follow Up Date- 08/26/20   - avoid negative self-talk - develop a personal safety plan - develop a plan to deal with triggers like holidays, anniversaries - exercise at least 2 to 3 times per week - have a plan for how to handle bad days - journal feelings and what helps to feel better or worse - spend time or talk with others at least 2 to 3 times per week - spend time or talk with others every day - watch for early signs of feeling worse - write in journal every day    Why is this important?    Keeping track of your progress will help your treatment team find the right mix of medicine and therapy for you.   Write in your journal every day.   Day-to-day changes in depression symptoms are normal. It may be more helpful to check your progress at the end of each week instead of every day.      Current Barriers:  . Financial constraints related to affording in home support or eventual long term care placement . Limited social support . Level of care concerns . ADL IADL limitations . Limited access to caregiver . Inability to perform ADL's independently . Inability to perform IADL's independently . Ongoing depression  Clinical Social Work Clinical Goal(s):  Marland Kitchen Over the next 120 days, patient will work with SW to address concerns related to increasing depression and anxiety management and gaining additional support within the home until patient wishes to pursue LTC placement  Interventions: . Patient interviewed and appropriate assessments performed . Patient was informed that current CCM LCSW will be leaving position next month and her next CCM Social Work follow up visit will be with another LCSW. Patient was appreciative of support provided and receptive to news . Provided patient and family with information about long term care placement (ALF) and payment sources-out of pocket or Medicaid. Per patient and son, they have completed several tours of local ALF's but patient declined wanting to move further in this process at this time. Patient reports that she wishes to stay in her home for a slong as possible. LCSW also provided education on personal care service resources within the area. CCM LCSW has already placed patient on the wait list for C.H.O.R.E. Son is agreeable to contact program to see where she is at on their wait list but understands that this wait list is very long.  . Discussed plans with patient for ongoing care management follow up and provided patient with direct contact information for care management team . Advised patient to consider finding in home support. Patient found an aide that cost $20 per hour but is not able to afford this service. LCSW encouraged patient to network to see if she can find someone at a lesser rate. Family was educated on the stipend  caregiver relief program at [facility] as well.  . Patient admits ongoing loneliness and depression. LCSW provided active and reflective listening throughout conversation. Patient shares that she puts on music when she is experiencing symptoms of depression and it helps. She was educated on other coping skills to implement into her daily routine to combat her depression. Patient was receptive to emotional support and healthy self-care education that was provided.  . Patient does NOT want CCM LCSW to make referral for psychiatry or counseling. She shares that she does not want medication management for her depression as she has tried medications in the past and they only made her  worse. She was strongly encouraged to consider implementing counseling but patient declined wanting this referral.  . Assisted patient/caregiver with obtaining information about health plan benefits . Provided education and assistance to client regarding Advanced Directives. . Provided education to patient/caregiver regarding level of care options. Marland Kitchen LCSW successfully coordinated care with Home Care Providers and placed patient on the wait list for C.H.O.R.E program on 06/20/19. Patient was advised to contact program to inquire about where she is on the wait list currently. . Patient reports that her daughter in law continues to get her groceries for her and provides transportation for her medical appointments. She has two sons that assist with her care. She reports that her family visits her usually once per week on Sundays. . Patient shares that her arthritis and COPD have flared up recently. Coping skill education provided for pain management.  . Patient reports that she is stable and doing well. She reports that she uses her walker and cane when needed and has had no recent falls because she takes her time getting up and down.  . Patient reports that she is having recent difficulty with hearing over the phone even with  her hearing aides.  Patient Self Care Activities:  . Self administers medications as prescribed . Attends all scheduled provider appointments . Unable to independently take care of self within the home and is in need of additional personal care service involvement.   Please see past updates related to this goal by clicking on the "Past Updates" button in the selected goal        Follow Up Plan: SW will follow up with patient by phone over the next quarter      [name], [place], MSW, [email or web address] Phone: [phone]

## 2020-07-08 NOTE — Patient Instructions (Signed)
Licensed Clinical Social Worker Visit Information  Goals we discussed today:  Goals Addressed            This Visit's Progress   . SW-Track and Manage My Symptoms-Depression         Timeframe:  Long-Range Goal Priority:  Medium Start Date 3/23/222                        Expected End Date: 10/08/20            Follow Up Date- 08/26/20   - avoid negative self-talk - develop a personal safety plan - develop a plan to deal with triggers like holidays, anniversaries - exercise at least 2 to 3 times per week - have a plan for how to handle bad days - journal feelings and what helps to feel better or worse - spend time or talk with others at least 2 to 3 times per week - spend time or talk with others every day - watch for early signs of feeling worse - write in journal every day    Why is this important?    Keeping track of your progress will help your treatment team find the right mix of medicine and therapy for you.   Write in your journal every day.   Day-to-day changes in depression symptoms are normal. It may be more helpful to check your progress at the end of each week instead of every day.     Current Barriers:  . Financial constraints related to affording in home support or eventual long term care placement . Limited social support . Level of care concerns . ADL IADL limitations . Limited access to caregiver . Inability to perform ADL's independently . Inability to perform IADL's independently . Ongoing depression  Clinical Social Work Clinical Goal(s):  Marland Kitchen Over the next 120 days, patient will work with SW to address concerns related to increasing depression and anxiety management and gaining additional support within the home until patient wishes to pursue LTC placement  Interventions: . Patient interviewed and appropriate assessments performed . Patient was informed that current CCM LCSW will be leaving position next month and her next CCM Social Work follow up  visit will be with another LCSW. Patient was appreciative of support provided and receptive to news . Provided patient and family with information about long term care placement (ALF) and payment sources-out of pocket or Medicaid. Per patient and son, they have completed several tours of local ALF's but patient declined wanting to move further in this process at this time. Patient reports that she wishes to stay in her home for a slong as possible. LCSW also provided education on personal care service resources within the area. CCM LCSW has already placed patient on the wait list for C.H.O.R.E. Son is agreeable to contact program to see where she is at on their wait list but understands that this wait list is very long.  . Discussed plans with patient for ongoing care management follow up and provided patient with direct contact information for care management team . Advised patient to consider finding in home support. Patient found an aide that cost $20 per hour but is not able to afford this service. LCSW encouraged patient to network to see if she can find someone at a lesser rate. Family was educated on the stipend caregiver relief program at [facility] as well.  . Patient admits ongoing loneliness and depression. LCSW provided active and  reflective listening throughout conversation. Patient shares that she puts on music when she is experiencing symptoms of depression and it helps. She was educated on other coping skills to implement into her daily routine to combat her depression. Patient was receptive to emotional support and healthy self-care education that was provided.  . Patient does NOT want CCM LCSW to make referral for psychiatry or counseling. She shares that she does not want medication management for her depression as she has tried medications in the past and they only made her worse. She was strongly encouraged to consider implementing counseling but patient declined wanting this  referral.  . Assisted patient/caregiver with obtaining information about health plan benefits . Provided education and assistance to client regarding Advanced Directives. . Provided education to patient/caregiver regarding level of care options. Marland Kitchen LCSW successfully coordinated care with Home Care Providers and placed patient on the wait list for C.H.O.R.E program on 06/20/19. Patient was advised to contact program to inquire about where she is on the wait list currently. . Patient reports that her daughter in law continues to get her groceries for her and provides transportation for her medical appointments. She has two sons that assist with her care. She reports that her family visits her usually once per week on Sundays. . Patient shares that her arthritis and COPD have flared up recently. Coping skill education provided for pain management.  . Patient reports that she is stable and doing well. She reports that she uses her walker and cane when needed and has had no recent falls because she takes her time getting up and down.  . Patient reports that she is having recent difficulty with hearing over the phone even with her hearing aides.  Patient Self Care Activities:  . Self administers medications as prescribed . Attends all scheduled provider appointments . Unable to independently take care of self within the home and is in need of additional personal care service involvement.   Please see past updates related to this goal by clicking on the "Past Updates" button in the selected goal         [name], [place], MSW, [email or web address] Phone: [phone]

## 2020-07-12 ENCOUNTER — Other Ambulatory Visit: Payer: Self-pay | Admitting: Family Medicine

## 2020-07-12 DIAGNOSIS — I10 Essential (primary) hypertension: Secondary | ICD-10-CM

## 2020-07-12 DIAGNOSIS — I129 Hypertensive chronic kidney disease with stage 1 through stage 4 chronic kidney disease, or unspecified chronic kidney disease: Secondary | ICD-10-CM

## 2020-07-12 NOTE — Telephone Encounter (Signed)
Future visit 09/21/20. Approved per protocol.  Requested Prescriptions  Pending Prescriptions Disp Refills  . chlorthalidone (HYGROTON) 25 MG tablet [Pharmacy Med Name: CHLORTHALIDONE 25MG  TABLETS] 90 tablet 0    Sig: TAKE 1 TABLET(25 MG) BY MOUTH DAILY     Cardiovascular: Diuretics - Thiazide Passed - 07/12/2020  7:01 AM      Passed - Ca in normal range and within 360 days    Calcium  Date Value Ref Range Status  02/21/2020 9.7 8.6 - 10.4 mg/dL Final   Calcium, Total  Date Value Ref Range Status  04/28/2014 8.9 8.5 - 10.1 mg/dL Final         Passed - Cr in normal range and within 360 days    Creat  Date Value Ref Range Status  02/21/2020 0.80 0.60 - 0.88 mg/dL Final    Comment:    For patients >40 years of age, the reference limit for Creatinine is approximately 13% higher for people identified as African-American. .          Passed - K in normal range and within 360 days    Potassium  Date Value Ref Range Status  02/21/2020 3.6 3.5 - 5.3 mmol/L Final  04/28/2014 4.1 3.5 - 5.1 mmol/L Final         Passed - Na in normal range and within 360 days    Sodium  Date Value Ref Range Status  02/21/2020 137 135 - 146 mmol/L Final  06/12/2015 137 134 - 144 mmol/L Final  04/28/2014 139 136 - 145 mmol/L Final         Passed - Last BP in normal range    BP Readings from Last 1 Encounters:  03/02/20 (!) 127/52         Passed - Valid encounter within last 6 months    Recent Outpatient Visits          4 months ago Annual physical exam   [place], DO   9 months ago [facility], Devonne Doughty, DO   1 year ago Pre-diabetes   [place], DO   1 year ago Annual physical exam   [place], DO   1 year ago Mixed conductive and sensorineural hearing loss of left ear with restricted hearing of right ear   [facility] [employer], NP      Future Appointments            In 2 months [place], Devonne Doughty, [facility], [facility]           . losartan (COZAAR) 100 MG tablet [Pharmacy Med Name: LOSARTAN 100MG  TABLETS] 90 tablet 0    Sig: TAKE 1 TABLET(100 MG) BY MOUTH DAILY     Cardiovascular:  Angiotensin Receptor Blockers Passed - 07/12/2020  7:01 AM      Passed - Cr in normal range and within 180 days    Creat  Date Value Ref Range Status  02/21/2020 0.80 0.60 - 0.88 mg/dL Final    Comment:    For patients >55 years of age, the reference limit for Creatinine is approximately 13% higher for people identified as African-American. .          Passed - K in normal range and within 180 days    Potassium  Date Value Ref Range Status  02/21/2020 3.6 3.5 - 5.3  mmol/L Final  04/28/2014 4.1 3.5 - 5.1 mmol/L Final         Passed - Patient is not pregnant      Passed - Last BP in normal range    BP Readings from Last 1 Encounters:  03/02/20 (!) 127/52         Passed - Valid encounter within last 6 months    Recent Outpatient Visits          4 months ago Annual physical exam   [place], DO   9 months ago Pre-diabetes   [place], Devonne Doughty, DO   1 year ago [place], DO   1 year ago Annual physical exam   [facility] Olin Hauser, DO   1 year ago Mixed conductive and sensorineural hearing loss of left ear with restricted hearing of right ear   [place], NP      Future Appointments            In 2 months [place], Devonne Doughty, DO [facility], [facility]           . amLODipine ([place]) 5 MG tablet [Pharmacy Med Name: AMLODIPINE BESYLATE 5MG  TABLETS] 90 tablet 0    Sig: TAKE 1 TABLET(5 MG) BY MOUTH DAILY     Cardiovascular:   Calcium Channel Blockers Passed - 07/12/2020  7:01 AM      Passed - Last BP in normal range    BP Readings from Last 1 Encounters:  03/02/20 (!) 127/52         Passed - Valid encounter within last 6 months    Recent Outpatient Visits          4 months ago Annual physical exam   [place], DO   9 months ago [place], DO   1 year ago [place], DO   1 year ago Annual physical exam   [place], DO   1 year ago Mixed conductive and sensorineural hearing loss of left ear with restricted hearing of right ear   [facility] [employer], NP      Future Appointments            In 2 months [place], Devonne Doughty, [facility], [facility]

## 2020-07-20 ENCOUNTER — Telehealth: Payer: Self-pay | Admitting: General Practice

## 2020-07-20 ENCOUNTER — Telehealth: Payer: Medicare Other

## 2020-07-20 NOTE — Telephone Encounter (Signed)
  Chronic Care Management   Outreach Note  07/20/2020 Name: Candace Tapia MRN: [id number] DOB: [birth year]  Referred by: Olin Hauser, DO Reason for referral : Appointment (RNCM: Follow up for Chronic Disease Management and Care Coordination Needs)   An unsuccessful telephone outreach was attempted today. The patient was referred to the case management team for assistance with care management and care coordination.   Follow Up Plan: A HIPAA compliant phone message was left for the patient providing contact information and requesting a return call.   Noreene Larsson RN, MSN, [place] Mobile: [phone]

## 2020-07-21 ENCOUNTER — Ambulatory Visit (INDEPENDENT_AMBULATORY_CARE_PROVIDER_SITE_OTHER): Payer: Medicare Other

## 2020-07-21 VITALS — Ht 65.5 in | Wt 210.0 lb

## 2020-07-21 DIAGNOSIS — Z0000 Encounter for general adult medical examination without abnormal findings: Secondary | ICD-10-CM | POA: Diagnosis not present

## 2020-07-21 DIAGNOSIS — J449 Chronic obstructive pulmonary disease, unspecified: Secondary | ICD-10-CM | POA: Diagnosis not present

## 2020-07-21 NOTE — Progress Notes (Signed)
I connected with Candace Tapia today by telephone and verified that I am speaking with the correct person using two identifiers. Location patient: home Location provider: work Persons participating in the virtual visit: Shalisha Clausing, Glenna Durand LPN.   I discussed the limitations, risks, security and privacy concerns of performing an evaluation and management service by telephone and the availability of in person appointments. I also discussed with the patient that there may be a patient responsible charge related to this service. The patient expressed understanding and verbally consented to this telephonic visit.    Interactive audio and video telecommunications were attempted between this provider and patient, however failed, due to patient having technical difficulties OR patient did not have access to video capability.  We continued and completed visit with audio only.     Vital signs may be patient reported or missing.  Subjective:   Candace Tapia is a 85 y.o. female who presents for Medicare Annual (Subsequent) preventive examination.  Review of Systems     Cardiac Risk Factors include: advanced age (>9men, >14 women);dyslipidemia;hypertension;obesity (BMI >30kg/m2);sedentary lifestyle     Objective:    Today's Vitals   07/21/20 1517 07/21/20 1518  Weight: 210 lb (95.3 kg)   Height: 5' 5.5" (1.664 m)   PainSc:  6    Body mass index is 34.41 kg/m.  Advanced Directives 07/21/2020 08/18/2019 01/01/2019 10/03/2018 10/02/2018 01/15/2018 12/26/2017  Does Patient Have a Medical Advance Directive? Yes Yes Yes Yes No Yes Yes  Type of Paramedic of [place];Living will [place];Living will Living will;Healthcare Power of [place];Living will  Does patient want to make changes to medical advance directive? - - - - - - -  Copy of [place] in Chart? No - copy requested - No - copy requested - - - No - copy requested  Would patient like information on creating a medical advance directive? - - - - No - Patient declined - -    Current Medications (verified) Outpatient Encounter Medications as of 07/21/2020  Medication Sig  . acetaminophen (TYLENOL) 650 MG CR tablet Take 650 mg by mouth every 8 (eight) hours as needed for pain.  Marland Kitchen albuterol (VENTOLIN HFA) 108 (90 Base) MCG/ACT inhaler Inhale 1 puff into the lungs every 6 (six) hours as needed.   Marland Kitchen amLODipine (NORVASC) 5 MG tablet TAKE 1 TABLET(5 MG) BY MOUTH DAILY  . chlorthalidone (HYGROTON) 25 MG tablet TAKE 1 TABLET(25 MG) BY MOUTH DAILY  . Cholecalciferol 25 MCG (1000 UT) capsule Vitamin D3 25 mcg (1,000 unit) capsule  1 po qd  . fluticasone (FLONASE) 50 MCG/ACT nasal spray Place 1 spray into both nostrils as needed.   . Fluticasone-Umeclidin-Vilant (TRELEGY ELLIPTA) 100-62.5-25 MCG/INH AEPB Inhale 1 puff into the lungs daily.  Marland Kitchen gabapentin (NEURONTIN) 400 MG capsule TAKE 1 CAPSULE(400 MG) BY MOUTH TWICE DAILY  . losartan (COZAAR) 100 MG tablet TAKE 1 TABLET(100 MG) BY MOUTH DAILY  . meclizine (ANTIVERT) 25 MG tablet Take 0.5 tablets (12.5 mg total) by mouth every 8 (eight) hours as needed for dizziness.  . Multiple Vitamins-Minerals (MULTIVITAMIN WOMEN 50+) TABS Take 1 tablet by mouth daily.   Marland Kitchen NEXIUM 20 MG capsule Take 1 capsule (20 mg total) by mouth daily.  . potassium chloride (MICRO-K) 10 MEQ CR capsule TAKE 1 CAPSULE BY MOUTH DAILY. MAY OPEN CAPSULE AND SPRINKLE OVER SOFT FOOD OR SWALLOW WHOLE  .  Spacer/Aero-Holding Chambers (AEROCHAMBER PLUS) inhaler Use as instructed  . aspirin 81 MG chewable tablet Chew 81 mg by mouth daily as needed.  (Patient not taking: Reported on 07/21/2020)  . [DISCONTINUED] fexofenadine (ALLEGRA ALLERGY) 180 MG tablet Take 180 mg by mouth as needed. Reported on 09/29/2015   No facility-administered encounter medications on file as of  07/21/2020.    Allergies (verified) Tramadol hcl, Statins, Penicillin g, Penicillins, and Sulfa antibiotics   History: Past Medical History:  Diagnosis Date  . Allergy   . Arthritis   . Breast cancer ([place]) 2014   radiation and taking exemestane  . Cancer of left breast ([place]) 10/27/2014  . GERD (gastroesophageal reflux disease)   . H/O: hysterectomy   . History of lumpectomy 2014   LT breast   Past Surgical History:  Procedure Laterality Date  . ABDOMINAL HYSTERECTOMY    . BREAST LUMPECTOMY Left 2014  . BREAST SURGERY Left 2014  . JOINT REPLACEMENT    . LYMPH NODE DISSECTION     neck  . TOTAL SHOULDER REPLACEMENT Bilateral 2010 and 2013   Family History  Problem Relation Age of Onset  . Breast cancer Mother 55  . Diabetes Mother   . Kidney disease Father   . Diabetes Father   . Breast cancer Sister 13   Social History   Socioeconomic History  . Marital status: Widowed    Spouse name: Not on file  . Number of children: Not on file  . Years of education: Not on file  . Highest education level: 8th grade  Occupational History  . Not on file  Tobacco Use  . Smoking status: Former Smoker    Packs/day: 2.00    Years: 40.00    Pack years: 80.00    Quit date: 04/19/1993    Years since quitting: 27.2  . Smokeless tobacco: Never Used  Vaping Use  . Vaping Use: Not on file  Substance and Sexual Activity  . Alcohol use: No    Alcohol/week: 0.0 standard drinks  . Drug use: No  . Sexual activity: Not Currently  Other Topics Concern  . Not on file  Social History Narrative  . Not on file   Social Determinants of Health   Financial Resource Strain: Low Risk   . Difficulty of Paying Living Expenses: Not hard at all  Food Insecurity: No Food Insecurity  . Worried About Charity fundraiser in the Last Year: Never true  . Ran Out of Food in the Last Year: Never true  Transportation Needs: No Transportation Needs  . Lack of Transportation (Medical): No  . Lack of  Transportation (Non-Medical): No  Physical Activity: Inactive  . Days of Exercise per Week: 0 days  . Minutes of Exercise per Session: 0 min  Stress: No Stress Concern Present  . Feeling of Stress : Not at all  Social Connections: Not on file    Tobacco Counseling Counseling given: Not Answered   Clinical Intake:  Pre-visit preparation completed: Yes  Pain : 0-10 Pain Score: 6  Pain Type: Chronic pain Pain Location: Generalized Pain Descriptors / Indicators: Aching Pain Onset: More than a month ago Pain Frequency: Constant     Nutritional Status: BMI > 30  Obese Nutritional Risks: None Diabetes: No  How often do you need to have someone help you when you read instructions, pamphlets, or other written materials from your doctor or pharmacy?: 1 - Never What is the last grade level you completed in school?: 8th  grade  Diabetic? no  Interpreter Needed?: No  Information entered by :: NAllen LPN   Activities of Daily Living In your present state of health, do you have any difficulty performing the following activities: 07/21/2020 03/02/2020  Hearing? Tempie Donning  Vision? N N  Difficulty concentrating or making decisions? Y Y  Comment forgetfulness -  Walking or climbing stairs? N Y  Dressing or bathing? N Y  Doing errands, shopping? N Y  Conservation officer, nature and eating ? N -  Using the Toilet? N -  In the past six months, have you accidently leaked urine? N -  Do you have problems with loss of bowel control? N -  Managing your Medications? N -  Managing your Finances? N -  Housekeeping or managing your Housekeeping? N -  Some recent data might be hidden    Patient Care Team: Olin Hauser, DO as PCP - General (Family Medicine) Erby Pian, MD as Referring Physician (Specialist) Lequita Asal, MD as Referring Physician (Hematology and Oncology) Greg Cutter, LCSW as Social Worker (Licensed Clinical Social Worker) [place], Virl Diamond, [place] as  Pharmacist Vanita Ingles, RN as Case Manager (General Practice)  Indicate any recent Medical Services you may have received from other than [facility] providers in the past year (date may be approximate).     Assessment:   This is a routine wellness examination for [place].  Hearing/Vision screen  Hearing Screening   125Hz  250Hz  500Hz  1000Hz  2000Hz  3000Hz  4000Hz  6000Hz  8000Hz   Right ear:           Left ear:           Vision Screening Comments: No regular eye exams, [facility]  Dietary issues and exercise activities discussed: Current Exercise Habits: The patient does not participate in regular exercise at present  Goals    .  "I really extra support at times because I'm alone" (pt-stated)      Current Barriers:  . Financial constraints related to affording in home support or eventual long term care placement . Limited social support . Level of care concerns . ADL IADL limitations . Limited access to caregiver . Inability to perform ADL's independently . Inability to perform IADL's independently . Ongoing depression  Clinical Social Work Clinical Goal(s):  Marland Kitchen Over the next 120 days, patient will work with SW to address concerns related to gaining additional support in the home until patient wishes to pursue LTC placement  Interventions: . Patient interviewed and appropriate assessments performed . Provided patient with information about long term care placement (ALF) and payment sources-out of pocket or Medicaid. LCSW also provided education on personal care service resources within the area. Patient agreeable to C.H.O.R.E referral. . Discussed plans with patient for ongoing care management follow up and provided patient with direct contact information for care management team . Advised patient to consider finding in home support. Patient found an aide that cost $20 per hour but is not able to afford this service. LCSW encouraged patient to network to see if she can find  someone at a lesser rate. Nash Dimmer with RN Case Manager re: need for care management services . Patient admits ongoing loneliness and depression. LCSW provided active and reflective listening throughout conversation. Patient shares that she puts on music when she is experiencing symptoms of depression and it helps. She was educated on other coping skills to implement into her daily routine to combat her depression. Patient was receptive to emotional support and  healthy self-care education that was provided.  . Assisted patient/caregiver with obtaining information about health plan benefits . Provided education and assistance to client regarding Advanced Directives. . Provided education to patient/caregiver regarding level of care options. Marland Kitchen UPDATE- LCSW successfully coordinated care with Home Care Providers and was able to place patient on the wait list for C.H.O.R.E program on 06/20/19. Patient was advised to contact program to inquire about where she is on the wait list currently. . Patient reports that her daughter in law continues to get her groceries for her. She reports that her family visits her usually once per week on Sundays. . Patient shares that has all of her medications and was prescribed a new medication by Dr. Raul Del on 12/17/19 to assist with her SOB. She reports that she "thinks" it is helping but can't be sure now as it is a new medication. . Patient reports that she is having recent difficulty with hearing over the phone even with her hearing aides.  Patient Self Care Activities:  . Self administers medications as prescribed . Attends all scheduled provider appointments . Unable to independently take care of self within the home and is in need of additional personal care service involvement.   Please see past updates related to this goal by clicking on the "Past Updates" button in the selected goal      .  Increase water intake      Recommend drinking at least 6-8 glasses  of water a day    .  Patient Stated      07/21/2020, drink more water    .  Pharmacy Goals      Please remember to use your Trelegy inhaler daily as directed and rinse out your mouth after each use.  Please check your home blood pressure, keep a log of the results and bring this with you to your medical appointments.   Feel free to call me with any questions or concerns. I look forward to our next call!  Harlow Asa, PharmD, [facility] [place] [phone]    .  SW-Track and Manage My Symptoms-Depression        Timeframe:  Long-Range Goal Priority:  Medium Start Date 3/23/222                        Expected Tapia Date: 10/08/20            Follow Up Date- 08/26/20   - avoid negative self-talk - develop a personal safety plan - develop a plan to deal with triggers like holidays, anniversaries - exercise at least 2 to 3 times per week - have a plan for how to handle bad days - journal feelings and what helps to feel better or worse - spend time or talk with others at least 2 to 3 times per week - spend time or talk with others every day - watch for early signs of feeling worse - write in journal every day    Why is this important?    Keeping track of your progress will help your treatment team find the right mix of medicine and therapy for you.   Write in your journal every day.   Day-to-day changes in depression symptoms are normal. It may be more helpful to check your progress at the Tapia of each week instead of every day.     Current Barriers:  . Financial constraints related to affording in home support  or eventual long term care placement . Limited social support . Level of care concerns . ADL IADL limitations . Limited access to caregiver . Inability to perform ADL's independently . Inability to perform IADL's independently . Ongoing depression  Clinical Social Work Clinical Goal(s):  Marland Kitchen Over the next 120 days,  patient will work with SW to address concerns related to increasing depression and anxiety management and gaining additional support within the home until patient wishes to pursue LTC placement  Interventions: . Patient interviewed and appropriate assessments performed . Patient was informed that current CCM LCSW will be leaving position next month and her next CCM Social Work follow up visit will be with another LCSW. Patient was appreciative of support provided and receptive to news . Provided patient and family with information about long term care placement (ALF) and payment sources-out of pocket or Medicaid. Per patient and son, they have completed several tours of local ALF's but patient declined wanting to move further in this process at this time. Patient reports that she wishes to stay in her home for a slong as possible. LCSW also provided education on personal care service resources within the area. CCM LCSW has already placed patient on the wait list for C.H.O.R.E. Son is agreeable to contact program to see where she is at on their wait list but understands that this wait list is very long.  . Discussed plans with patient for ongoing care management follow up and provided patient with direct contact information for care management team . Advised patient to consider finding in home support. Patient found an aide that cost $20 per hour but is not able to afford this service. LCSW encouraged patient to network to see if she can find someone at a lesser rate. Family was educated on the stipend caregiver relief program at [facility] as well.  . Patient admits ongoing loneliness and depression. LCSW provided active and reflective listening throughout conversation. Patient shares that she puts on music when she is experiencing symptoms of depression and it helps. She was educated on other coping skills to implement into her daily routine to combat her depression. Patient was receptive to  emotional support and healthy self-care education that was provided.  . Patient does NOT want CCM LCSW to make referral for psychiatry or counseling. She shares that she does not want medication management for her depression as she has tried medications in the past and they only made her worse. She was strongly encouraged to consider implementing counseling but patient declined wanting this referral.  . Assisted patient/caregiver with obtaining information about health plan benefits . Provided education and assistance to client regarding Advanced Directives. . Provided education to patient/caregiver regarding level of care options. Marland Kitchen LCSW successfully coordinated care with Home Care Providers and placed patient on the wait list for C.H.O.R.E program on 06/20/19. Patient was advised to contact program to inquire about where she is on the wait list currently. . Patient reports that her daughter in law continues to get her groceries for her and provides transportation for her medical appointments. She has two sons that assist with her care. She reports that her family visits her usually once per week on Sundays. . Patient shares that her arthritis and COPD have flared up recently. Coping skill education provided for pain management.  . Patient reports that she is stable and doing well. She reports that she uses her walker and cane when needed and has had no recent falls because she takes  her time getting up and down.  . Patient reports that she is having recent difficulty with hearing over the phone even with her hearing aides.  Patient Self Care Activities:  . Self administers medications as prescribed . Attends all scheduled provider appointments . Unable to independently take care of self within the home and is in need of additional personal care service involvement.   Please see past updates related to this goal by clicking on the "Past Updates" button in the selected goal       Depression  Screen PHQ 2/9 Scores 07/21/2020 03/02/2020 10/16/2019 07/10/2019 06/06/2019 01/09/2019 01/01/2019  PHQ - 2 Score 6 0 0 0 0 0 0  PHQ- 9 Score 13 0 1 0 - 0 -  Exception Documentation - - - - - - -    Fall Risk Fall Risk  07/21/2020 03/02/2020 10/16/2019 07/10/2019 01/09/2019  Falls in the past year? 0 0 0 0 0  Comment - - - - -  Number falls in past yr: - 0 0 0 -  Injury with Fall? - 0 0 0 -  Risk for fall due to : Medication side effect - - - -  Follow up Falls evaluation completed;Education provided;Falls prevention discussed Falls evaluation completed Falls evaluation completed Falls evaluation completed Falls evaluation completed    FALL RISK PREVENTION PERTAINING TO THE HOME:  Any stairs in or around the home? No  If so, are there any without handrails? n/a Home free of loose throw rugs in walkways, pet beds, electrical cords, etc? Yes  Adequate lighting in your home to reduce risk of falls? Yes   ASSISTIVE DEVICES UTILIZED TO PREVENT FALLS:  Life alert? Yes  Use of a cane, walker or w/c? Yes  Grab bars in the bathroom? No  Shower chair or bench in shower? Yes  Elevated toilet seat or a handicapped toilet? Yes   TIMED UP AND GO:  Was the test performed? No .  .  Cognitive Function:     6CIT Screen 07/21/2020 01/01/2019 12/26/2017 12/06/2016  What Year? 0 points 0 points 0 points 0 points  What month? 0 points 0 points 0 points 0 points  What time? 0 points 0 points 0 points 0 points  Count back from 20 0 points 0 points 0 points 0 points  Months in reverse 0 points 0 points 0 points 0 points  Repeat phrase 8 points 0 points 2 points 0 points  Total Score 8 0 2 0    Immunizations Immunization History  Administered Date(s) Administered  . Fluad Quad(high Dose 65+) 01/01/2019, 03/02/2020  . Influenza, High Dose Seasonal PF 02/24/2015, 02/09/2016, 12/12/2016, 01/23/2018  . Influenza-Unspecified 01/16/2014  . Pneumococcal Conjugate-13 08/29/2014  . Pneumococcal Polysaccharide-23  04/19/2007  . Tdap 01/17/2013  . Zoster 08/29/2014    TDAP status: Up to date  Flu Vaccine status: Up to date  Pneumococcal vaccine status: Up to date  Covid-19 vaccine status: Declined, Education has been provided regarding the importance of this vaccine but patient still declined. Advised may receive this vaccine at local pharmacy or Health Dept.or vaccine clinic. Aware to provide a copy of the vaccination record if obtained from local pharmacy or Health Dept. Verbalized acceptance and understanding.  Qualifies for Shingles Vaccine? Yes   Zostavax completed Yes   Shingrix Completed?: No.    Education has been provided regarding the importance of this vaccine. Patient has been advised to call insurance company to determine out of pocket expense if they have  not yet received this vaccine. Advised may also receive vaccine at local pharmacy or Health Dept. Verbalized acceptance and understanding.  Screening Tests Health Maintenance  Topic Date Due  . COVID-19 Vaccine (1) 08/06/2020 (Originally 07/21/1940)  . INFLUENZA VACCINE  11/16/2020  . TETANUS/TDAP  01/18/2023  . DEXA SCAN  Completed  . PNA vac Low Risk Adult  Completed  . HPV VACCINES  Aged Out    Health Maintenance  There are no preventive care reminders to display for this patient.  Colorectal cancer screening: No longer required.   Mammogram status: No longer required due to age.  Bone Density status: Completed 10/03/2016.   Lung Cancer Screening: (Low Dose CT Chest recommended if Age 13-80 years, 30 pack-year currently smoking OR have quit w/in 15years.) does not qualify.   Lung Cancer Screening Referral: no  Additional Screening:  Hepatitis C Screening: does not qualify;  Vision Screening: Recommended annual ophthalmology exams for early detection of glaucoma and other disorders of the eye. Is the patient up to date with their annual eye exam?  No  Who is the provider or what is the name of the office in which  the patient attends annual eye exams? [facility] If pt is not established with a provider, would they like to be referred to a provider to establish care? No .   Dental Screening: Recommended annual dental exams for proper oral hygiene  Community Resource Referral / Chronic Care Management: CRR required this visit?  No   CCM required this visit?  No      Plan:     I have personally reviewed and noted the following in the patient's chart:   . Medical and social history . Use of alcohol, tobacco or illicit drugs  . Current medications and supplements . Functional ability and status . Nutritional status . Physical activity . Advanced directives . List of other physicians . Hospitalizations, surgeries, and ER visits in previous 12 months . Vitals . Screenings to include cognitive, depression, and falls . Referrals and appointments  In addition, I have reviewed and discussed with patient certain preventive protocols, quality metrics, and best practice recommendations. A written personalized care plan for preventive services as well as general preventive health recommendations were provided to patient.     Kellie Simmering, LPN   07/17/2876   Nurse Notes:

## 2020-07-21 NOTE — Patient Instructions (Signed)
Candace Tapia , Thank you for taking time to come for your Medicare Wellness Visit. I appreciate your ongoing commitment to your health goals. Please review the following plan we discussed and let me know if I can assist you in the future.   Screening recommendations/referrals: Colonoscopy: not required Mammogram: not required Bone Density: completed 10/03/2016 Recommended yearly ophthalmology/optometry visit for glaucoma screening and checkup Recommended yearly dental visit for hygiene and checkup  Vaccinations: Influenza vaccine: completed 03/02/2020, due 11/16/2020 Pneumococcal vaccine: completed 5/13/216 Tdap vaccine: completed 01/17/2013, due 01/18/2023 Shingles vaccine: discussed   Covid-19: decline  Advanced directives: Please bring a copy of your POA (Power of Attorney) and/or Living Will to your next appointment.   Conditions/risks identified: none  Next appointment: Follow up in one year for your annual wellness visit    Preventive Care 65 Years and Older, Female Preventive care refers to lifestyle choices and visits with your health care provider that can promote health and wellness. What does preventive care include?  A yearly physical exam. This is also called an annual well check.  Dental exams once or twice a year.  Routine eye exams. Ask your health care provider how often you should have your eyes checked.  Personal lifestyle choices, including:  Daily care of your teeth and gums.  Regular physical activity.  Eating a healthy diet.  Avoiding tobacco and drug use.  Limiting alcohol use.  Practicing safe sex.  Taking low-dose aspirin every day.  Taking vitamin and mineral supplements as recommended by your health care provider. What happens during an annual well check? The services and screenings done by your health care provider during your annual well check will depend on your age, overall health, lifestyle risk factors, and family history of  disease. Counseling  Your health care provider may ask you questions about your:  Alcohol use.  Tobacco use.  Drug use.  Emotional well-being.  Home and relationship well-being.  Sexual activity.  Eating habits.  History of falls.  Memory and ability to understand (cognition).  Work and work Statistician.  Reproductive health. Screening  You may have the following tests or measurements:  Height, weight, and BMI.  Blood pressure.  Lipid and cholesterol levels. These may be checked every 5 years, or more frequently if you are over 19 years old.  Skin check.  Lung cancer screening. You may have this screening every year starting at age 55 if you have a 30-pack-year history of smoking and currently smoke or have quit within the past 15 years.  Fecal occult blood test (FOBT) of the stool. You may have this test every year starting at age 13.  Flexible sigmoidoscopy or colonoscopy. You may have a sigmoidoscopy every 5 years or a colonoscopy every 10 years starting at age 59.  Hepatitis C blood test.  Hepatitis B blood test.  Sexually transmitted disease (STD) testing.  Diabetes screening. This is done by checking your blood sugar (glucose) after you have not eaten for a while (fasting). You may have this done every 1-3 years.  Bone density scan. This is done to screen for osteoporosis. You may have this done starting at age 67.  Mammogram. This may be done every 1-2 years. Talk to your health care provider about how often you should have regular mammograms. Talk with your health care provider about your test results, treatment options, and if necessary, the need for more tests. Vaccines  Your health care provider may recommend certain vaccines, such as:  Influenza vaccine. This  is recommended every year.  Tetanus, diphtheria, and acellular pertussis (Tdap, Td) vaccine. You may need a Td booster every 10 years.  Zoster vaccine. You may need this after age  83.  Pneumococcal 13-valent conjugate (PCV13) vaccine. One dose is recommended after age 49.  Pneumococcal polysaccharide (PPSV23) vaccine. One dose is recommended after age 21. Talk to your health care provider about which screenings and vaccines you need and how often you need them. This information is not intended to replace advice given to you by your health care provider. Make sure you discuss any questions you have with your health care provider. Document Released: 05/01/2015 Document Revised: 12/23/2015 Document Reviewed: 02/03/2015 Elsevier Interactive Patient Education  2017 [place] Prevention in the Home Falls can cause injuries. They can happen to people of all ages. There are many things you can do to make your home safe and to help prevent falls. What can I do on the outside of my home?  Regularly fix the edges of walkways and driveways and fix any cracks.  Remove anything that might make you trip as you walk through a door, such as a raised step or threshold.  Trim any bushes or trees on the path to your home.  Use bright outdoor lighting.  Clear any walking paths of anything that might make someone trip, such as rocks or tools.  Regularly check to see if handrails are loose or broken. Make sure that both sides of any steps have handrails.  Any raised decks and porches should have guardrails on the edges.  Have any leaves, snow, or ice cleared regularly.  Use sand or salt on walking paths during winter.  Clean up any spills in your garage right away. This includes oil or grease spills. What can I do in the bathroom?  Use night lights.  Install grab bars by the toilet and in the tub and shower. Do not use towel bars as grab bars.  Use non-skid mats or decals in the tub or shower.  If you need to sit down in the shower, use a plastic, non-slip stool.  Keep the floor dry. Clean up any water that spills on the floor as soon as it happens.  Remove  soap buildup in the tub or shower regularly.  Attach bath mats securely with double-sided non-slip rug tape.  Do not have throw rugs and other things on the floor that can make you trip. What can I do in the bedroom?  Use night lights.  Make sure that you have a light by your bed that is easy to reach.  Do not use any sheets or blankets that are too big for your bed. They should not hang down onto the floor.  Have a firm chair that has side arms. You can use this for support while you get dressed.  Do not have throw rugs and other things on the floor that can make you trip. What can I do in the kitchen?  Clean up any spills right away.  Avoid walking on wet floors.  Keep items that you use a lot in easy-to-reach places.  If you need to reach something above you, use a strong step stool that has a grab bar.  Keep electrical cords out of the way.  Do not use floor polish or wax that makes floors slippery. If you must use wax, use non-skid floor wax.  Do not have throw rugs and other things on the floor that can make you  trip. What can I do with my stairs?  Do not leave any items on the stairs.  Make sure that there are handrails on both sides of the stairs and use them. Fix handrails that are broken or loose. Make sure that handrails are as long as the stairways.  Check any carpeting to make sure that it is firmly attached to the stairs. Fix any carpet that is loose or worn.  Avoid having throw rugs at the top or bottom of the stairs. If you do have throw rugs, attach them to the floor with carpet tape.  Make sure that you have a light switch at the top of the stairs and the bottom of the stairs. If you do not have them, ask someone to add them for you. What else can I do to help prevent falls?  Wear shoes that:  Do not have high heels.  Have rubber bottoms.  Are comfortable and fit you well.  Are closed at the toe. Do not wear sandals.  If you use a  stepladder:  Make sure that it is fully opened. Do not climb a closed stepladder.  Make sure that both sides of the stepladder are locked into place.  Ask someone to hold it for you, if possible.  Clearly mark and make sure that you can see:  Any grab bars or handrails.  First and last steps.  Where the edge of each step is.  Use tools that help you move around (mobility aids) if they are needed. These include:  Canes.  Walkers.  Scooters.  Crutches.  Turn on the lights when you go into a dark area. Replace any light bulbs as soon as they burn out.  Set up your furniture so you have a clear path. Avoid moving your furniture around.  If any of your floors are uneven, fix them.  If there are any pets around you, be aware of where they are.  Review your medicines with your doctor. Some medicines can make you feel dizzy. This can increase your chance of falling. Ask your doctor what other things that you can do to help prevent falls. This information is not intended to replace advice given to you by your health care provider. Make sure you discuss any questions you have with your health care provider. Document Released: 01/29/2009 Document Revised: 09/10/2015 Document Reviewed: 05/09/2014 Elsevier Interactive Patient Education  2017 [employer].

## 2020-07-28 ENCOUNTER — Telehealth: Payer: Self-pay | Admitting: *Deleted

## 2020-07-28 NOTE — Chronic Care Management (AMB) (Signed)
  Care Management   Note  07/28/2020 Name: Candace Tapia MRN: [id number] DOB: 1935-07-13  Candace Tapia is a 85 y.o. year old female who is a primary care patient of Olin Hauser, DO and is actively engaged with the care management team. I reached out to Candace Tapia by phone today to assist with re-scheduling a follow up visit with the RN Case Manager  Follow up plan: Unsuccessful telephone outreach attempt made. A HIPAA compliant phone message was left for the patient providing contact information and requesting a return call.  The care management team will reach out to the patient again over the next 7 days.  If patient returns call to provider office, please advise to call Calvin Lysle Morales at [facility]

## 2020-07-28 NOTE — Chronic Care Management (AMB) (Signed)
  Care Management   Note  07/28/2020 Name: Candace Tapia MRN: [id number] DOB: 04-27-1935  Candace Tapia is a 85 y.o. year old female who is a primary care patient of Olin Hauser, DO and is actively engaged with the care management team. I reached out to Candace Tapia by phone today to assist with re-scheduling a follow up visit with the RN Case Manager  Follow up plan: Telephone appointment with care management team member scheduled for:  08/24/2020  [facility]

## 2020-07-28 NOTE — Telephone Encounter (Signed)
Please reschedule f/u with RN CM at [facility]

## 2020-07-28 NOTE — Telephone Encounter (Signed)
Patient rescheduled for 08/24/2020

## 2020-07-30 ENCOUNTER — Telehealth: Payer: Medicare Other

## 2020-08-20 DIAGNOSIS — J449 Chronic obstructive pulmonary disease, unspecified: Secondary | ICD-10-CM | POA: Diagnosis not present

## 2020-08-24 ENCOUNTER — Telehealth: Payer: Medicare Other | Admitting: General Practice

## 2020-08-24 ENCOUNTER — Ambulatory Visit (INDEPENDENT_AMBULATORY_CARE_PROVIDER_SITE_OTHER): Payer: Medicare Other | Admitting: General Practice

## 2020-08-24 DIAGNOSIS — I10 Essential (primary) hypertension: Secondary | ICD-10-CM

## 2020-08-24 DIAGNOSIS — J449 Chronic obstructive pulmonary disease, unspecified: Secondary | ICD-10-CM

## 2020-08-24 NOTE — Chronic Care Management (AMB) (Signed)
Chronic Care Management   CCM RN Visit Note  08/24/2020 Name: Candace Tapia MRN: [id number] DOB: March 09, 1936  Subjective: Candace Tapia is a 85 y.o. year old female who is a primary care patient of Olin Hauser, DO. The care management team was consulted for assistance with disease management and care coordination needs.    Engaged with patient by telephone for follow up visit in response to provider referral for case management and/or care coordination services.   Consent to Services:  The patient was given information about Chronic Care Management services, agreed to services, and gave verbal consent prior to initiation of services.  Please see initial visit note for detailed documentation.   Patient agreed to services and verbal consent obtained.   Assessment: Review of patient past medical history, allergies, medications, health status, including review of consultants reports, laboratory and other test data, was performed as part of comprehensive evaluation and provision of chronic care management services.   SDOH (Social Determinants of Health) assessments and interventions performed:    CCM Care Plan  Allergies  Allergen Reactions  . Tramadol Hcl Itching    Per pt it caused me to itch all over  . Statins     Not sure what exact med was; just made her feel bad (can't elaborate)  . Penicillin G Rash  . Penicillins Rash    Has patient had a PCN reaction causing immediate rash, facial/tongue/throat swelling, SOB or lightheadedness with hypotension: No Has patient had a PCN reaction causing severe rash involving mucus membranes or skin necrosis: No Has patient had a PCN reaction that required hospitalization: No Has patient had a PCN reaction occurring within the last 10 years: No If all of the above answers are "NO", then may proceed with Cephalosporin use.  . Sulfa Antibiotics Other (See Comments) and Rash    Outpatient Encounter Medications as of 08/24/2020   Medication Sig Note  . acetaminophen (TYLENOL) 650 MG CR tablet Take 650 mg by mouth every 8 (eight) hours as needed for pain.   Marland Kitchen albuterol (VENTOLIN HFA) 108 (90 Base) MCG/ACT inhaler Inhale 1 puff into the lungs every 6 (six) hours as needed.    Marland Kitchen amLODipine (NORVASC) 5 MG tablet TAKE 1 TABLET(5 MG) BY MOUTH DAILY   . aspirin 81 MG chewable tablet Chew 81 mg by mouth daily as needed.  (Patient not taking: Reported on 07/21/2020) 01/01/2019: Takes every once in a while  . chlorthalidone (HYGROTON) 25 MG tablet TAKE 1 TABLET(25 MG) BY MOUTH DAILY   . Cholecalciferol 25 MCG (1000 UT) capsule Vitamin D3 25 mcg (1,000 unit) capsule  1 po qd   . fluticasone (FLONASE) 50 MCG/ACT nasal spray Place 1 spray into both nostrils as needed.    . Fluticasone-Umeclidin-Vilant (TRELEGY ELLIPTA) 100-62.5-25 MCG/INH AEPB Inhale 1 puff into the lungs daily.   Marland Kitchen gabapentin (NEURONTIN) 400 MG capsule TAKE 1 CAPSULE(400 MG) BY MOUTH TWICE DAILY   . losartan (COZAAR) 100 MG tablet TAKE 1 TABLET(100 MG) BY MOUTH DAILY   . meclizine (ANTIVERT) 25 MG tablet Take 0.5 tablets (12.5 mg total) by mouth every 8 (eight) hours as needed for dizziness.   . Multiple Vitamins-Minerals (MULTIVITAMIN WOMEN 50+) TABS Take 1 tablet by mouth daily.    Marland Kitchen NEXIUM 20 MG capsule Take 1 capsule (20 mg total) by mouth daily.   . potassium chloride (MICRO-K) 10 MEQ CR capsule TAKE 1 CAPSULE BY MOUTH DAILY. MAY OPEN CAPSULE AND SPRINKLE OVER SOFT FOOD OR SWALLOW  WHOLE   . Spacer/Aero-Holding Chambers (AEROCHAMBER PLUS) inhaler Use as instructed   . [DISCONTINUED] fexofenadine (ALLEGRA ALLERGY) 180 MG tablet Take 180 mg by mouth as needed. Reported on 09/29/2015    No facility-administered encounter medications on file as of 08/24/2020.    Patient Active Problem List   Diagnosis Date Noted  . Obesity (BMI 35.0-39.9 without comorbidity) 01/09/2019  . Peripheral polyneuropathy 12/27/2017  . Vestibular disorder 03/20/2017  . CKD (chronic kidney  disease), stage II 12/09/2016  . Chronic venous insufficiency 11/25/2016  . Bilateral lower extremity edema 10/27/2016  . Pre-diabetes 08/12/2016  . Carotid bruit 02/09/2016  . History of left breast cancer 02/09/2016  . Long term current use of opiate analgesic 02/08/2016  . Long term prescription opiate use 02/08/2016  . Drug level above therapeutic range 02/08/2016  . Chronic low back pain (Location of Tertiary source of pain) (Bilateral) (R>L) 02/08/2016  . Chronic knee pain (Location of Secondary source of pain) (Bilateral) (L>R) 02/08/2016  . History bilateral shoulder replacement 02/08/2016  . Chronic hip pain (Location of Primary Source of Pain) (Bilateral) (R>L) 01/07/2016  . Complete rupture of rotator cuff 01/07/2016  . Hyperlipidemia 01/07/2016  . Osteopenia 01/07/2016  . Centrilobular emphysema ([place]) 07/21/2015  . DDD (degenerative disc disease), lumbar 03/05/2015  . Lumbar facet syndrome (Bilateral) (R>L) 03/05/2015  . Sacroiliac joint dysfunction 03/05/2015  . Osteoarthritis of hip (Bilateral) (R>L) 03/05/2015  . DJD of shoulder 03/05/2015  . Abnormal finding on mammography 03/03/2015  . Back pain with radiation 03/03/2015  . Major depression in full remission ([place]) 03/03/2015  . Dry skin 03/03/2015  . Hypokalemia 03/03/2015  . Drug-induced myopathy 03/03/2015  . Gastro-esophageal reflux disease without esophagitis 03/03/2015  . Allergic rhinitis, seasonal 03/03/2015  . Osteoarthritis of multiple joints 03/03/2015  . Benign hypertension with CKD (chronic kidney disease), stage II 11/28/2014  . Chronic pain 11/28/2014  . Vitamin D deficiency 11/28/2014  . Airway hyperreactivity 09/18/2013  . Microscopic hematuria 12/20/2011    Conditions to be addressed/monitored:HTN and COPD  Care Plan : RNCM: COPD (Adult)  Updates made by Vanita Ingles since 08/24/2020 12:00 AM    Problem: RNCM: Psychological Adjustment to Diagnosis (COPD)   Priority: Medium    Long-Range  Goal: RNCM: COPD management   Priority: Medium  Note:   Current Barriers:  Marland Kitchen Knowledge deficits related to basic understanding of COPD disease process . Knowledge deficits related to basic COPD self care/management . Knowledge deficit related to basic understanding of how to use inhalers and how inhaled medications work . Knowledge deficit related to importance of energy conservation . Limited Social Support . Lacks social connections . Does not contact provider office for questions/concerns  Case Manager Clinical Goal(s):  Over the next 120 days patient will report using inhalers as prescribed including rinsing mouth after use  Over the next 120 days patient will report utilizing pursed lip breathing for shortness of breath  Over the next 120 days, patient will be able to verbalize understanding of COPD action plan and when to seek appropriate levels of medical care  Over the next 120 days, patient will engage in lite exercise as tolerated to build/regain stamina and strength and reduce shortness of breath through activity tolerance  Over the next 120 days, patient will verbalize basic understanding of COPD disease process and self care activities  Over the next 120 days, patient will not be hospitalized for COPD exacerbation as evidenced  Interventions:  . Collaboration with Olin Hauser, DO  regarding development and update of comprehensive plan of care as evidenced by provider attestation and co-signature . Inter-disciplinary care team collaboration (see longitudinal plan of care)  Provided patient with basic written and verbal COPD education on self care/management/and exacerbation prevention. 08-24-2020: The patient denies any issues today with her breathing. States that she gets easily short of breath with increased activity. The patient states that she is staying in and not getting out at all. She does not like not being able to socialize but states that she knows she  needs to stay in and not risk being sick.   Provided patient with COPD action plan and reinforced importance of daily self assessment  Provided written and verbal instructions on pursed lip breathing and utilized returned demonstration as teach back  Provided instruction about proper use of medications used for management of COPD including inhalers  Advised patient to self assesses COPD action plan zone and make appointment with provider if in the yellow zone for 48 hours without improvement.  Provided patient with education about the role of exercise in the management of COPD  Advised patient to engage in light exercise as tolerated 3-5 days a week  Provided education about and advised patient to utilize infection prevention strategies to reduce risk of respiratory infection   Review of upcoming appointments. The patient sees the pcp on 09-21-2020 at 2 pm. Has a follow up with the LCSW on 08-26-2020- reminded the patient of this appointment.  Patient Goals/Self-Care Activities:  . - decision-making supported . - depression screen reviewed . - emotional support provided . - family involvement promoted . - problem-solving facilitated . - relaxation techniques promoted . - verbalization of feelings encouraged Follow Up Plan: Telephone follow up appointment with care management team member scheduled for: 10-26-2020 at 345 pm   Care Plan : RNCM: Hypertension (Adult)  Updates made by Vanita Ingles since 08/24/2020 12:00 AM    Problem: RNCM: HTN   Priority: Medium    Long-Range Goal: RNCM: Hypertension Monitored   Priority: Medium  Note:   Objective:  . Last practice recorded BP readings:  . BP Readings from Last 3 Encounters: .  03/02/20 . (!) 127/52 .  10/16/19 . (!) 122/56 .  10/02/19 . 137/70 .    Marland Kitchen Most recent eGFR/CrCl: No results found for: EGFR  No components found for: CRCL Current Barriers:  Marland Kitchen Knowledge Deficits related to basic understanding of hypertension  pathophysiology and self care management . Knowledge Deficits related to understanding of medications prescribed for management of hypertension . Limited Social Support . Lacks social connections . Does not contact provider office for questions/concerns Case Manager Clinical Goal(s):  Marland Kitchen Over the next 120 days, patient will verbalize understanding of plan for hypertension management . Over the next 120 days, patient will attend all scheduled medical appointments: 09-21-2020 . Over the next 120 days, patient will demonstrate improved adherence to prescribed treatment plan for hypertension as evidenced by taking all medications as prescribed, monitoring and recording blood pressure as directed, adhering to low sodium/DASH diet . Over the next 120 days, patient will demonstrate improved health management independence as evidenced by checking blood pressure as directed and notifying PCP if SBP>160 or DBP > 90, taking all medications as prescribe, and adhering to a low sodium diet as discussed. . Over the next 120 days, patient will verbalize basic understanding of hypertension disease process and self health management plan as evidenced by compliance with medications, compliance with diet, and working with CCM  team to manage health and well being  Interventions:  . Collaboration with Olin Hauser, DO regarding development and update of comprehensive plan of care as evidenced by provider attestation and co-signature . Inter-disciplinary care team collaboration (see longitudinal plan of care) . Evaluation of current treatment plan related to hypertension self management and patient's adherence to plan as established by provider. 08-24-2020: The patient states her blood pressures have been good. She denies any issues with light headedness or dizziness. The patient states that she is eating like a "horse". Denies any safety concerns. Has a life alert system.  . Provided education to patient re: stroke  prevention, s/s of heart attack and stroke, DASH diet, complications of uncontrolled blood pressure . Reviewed medications with patient and discussed importance of compliance. 08-24-2020: States compliance with medications regimen.  . Discussed plans with patient for ongoing care management follow up and provided patient with direct contact information for care management team. 08-24-2020: The patient wants to know about an assisted living facility. She has explored this before but feels it is time to look into it further. LCSW has an appointment with the patient on 08-26-2020. Reminded the patient of the LCSW appointment and will collaborate with the LCSW with the patients expressed needs.  She also wanted the [facility] to make sure the LCSW knows she is HOH.   . Advised patient, providing education and rationale, to monitor blood pressure daily and record, calling PCP for findings outside established parameters.  . Reviewed scheduled/upcoming provider appointments including: 09-21-2020 at 2 pm Patient Goals/Self-Care Activities . Over the next 120 days, patient will:  - Self administers medications as prescribed Attends all scheduled provider appointments- next appointment on 09-21-2020 Calls provider office for new concerns, questions, or BP outside discussed parameters Checks BP and records as discussed Follows a low sodium diet/DASH diet - blood pressure trends reviewed - depression screen reviewed - home or ambulatory blood pressure monitoring encouraged Follow Up Plan: Telephone follow up appointment with care management team member scheduled for: 10-26-2020 at 345 pm     Plan:Telephone follow up appointment with care management team member scheduled for:  10-26-2020 at 345 pm  [place], MSN, [facility]: [phone]

## 2020-08-24 NOTE — Patient Instructions (Signed)
Visit Information  PATIENT GOALS: Patient Care Plan: RNCM: COPD (Adult)    Problem Identified: RNCM: Psychological Adjustment to Diagnosis (COPD)   Priority: Medium    Long-Range Goal: RNCM: COPD management   Priority: Medium  Note:   Current Barriers:  Marland Kitchen Knowledge deficits related to basic understanding of COPD disease process . Knowledge deficits related to basic COPD self care/management . Knowledge deficit related to basic understanding of how to use inhalers and how inhaled medications work . Knowledge deficit related to importance of energy conservation . Limited Social Support . Lacks social connections . Does not contact provider office for questions/concerns  Case Manager Clinical Goal(s):  Over the next 120 days patient will report using inhalers as prescribed including rinsing mouth after use  Over the next 120 days patient will report utilizing pursed lip breathing for shortness of breath  Over the next 120 days, patient will be able to verbalize understanding of COPD action plan and when to seek appropriate levels of medical care  Over the next 120 days, patient will engage in lite exercise as tolerated to build/regain stamina and strength and reduce shortness of breath through activity tolerance  Over the next 120 days, patient will verbalize basic understanding of COPD disease process and self care activities  Over the next 120 days, patient will not be hospitalized for COPD exacerbation as evidenced  Interventions:  . Collaboration with Olin Hauser, DO regarding development and update of comprehensive plan of care as evidenced by provider attestation and co-signature . Inter-disciplinary care team collaboration (see longitudinal plan of care)  Provided patient with basic written and verbal COPD education on self care/management/and exacerbation prevention. 08-24-2020: The patient denies any issues today with her breathing. States that she gets easily  short of breath with increased activity. The patient states that she is staying in and not getting out at all. She does not like not being able to socialize but states that she knows she needs to stay in and not risk being sick.   Provided patient with COPD action plan and reinforced importance of daily self assessment  Provided written and verbal instructions on pursed lip breathing and utilized returned demonstration as teach back  Provided instruction about proper use of medications used for management of COPD including inhalers  Advised patient to self assesses COPD action plan zone and make appointment with provider if in the yellow zone for 48 hours without improvement.  Provided patient with education about the role of exercise in the management of COPD  Advised patient to engage in light exercise as tolerated 3-5 days a week  Provided education about and advised patient to utilize infection prevention strategies to reduce risk of respiratory infection   Review of upcoming appointments. The patient sees the pcp on 09-21-2020 at 2 pm. Has a follow up with the LCSW on 08-26-2020- reminded the patient of this appointment.  Patient Goals/Self-Care Activities:  . - decision-making supported . - depression screen reviewed . - emotional support provided . - family involvement promoted . - problem-solving facilitated . - relaxation techniques promoted . - verbalization of feelings encouraged Follow Up Plan: Telephone follow up appointment with care management team member scheduled for: 10-26-2020 at 345 pm   Task: RNCM: Support Psychosocial Response to Chronic Obstructive Pulmonary Disease   Note:   Care Management Activities:    - decision-making supported - depression screen reviewed - emotional support provided - family involvement promoted - problem-solving facilitated - relaxation techniques promoted - verbalization  of feelings encouraged     Patient Care Plan: RNCM:  Hypertension (Adult)    Problem Identified: RNCM: HTN   Priority: Medium    Long-Range Goal: RNCM: Hypertension Monitored   Priority: Medium  Note:   Objective:  . Last practice recorded BP readings:  . BP Readings from Last 3 Encounters: .  03/02/20 . (!) 127/52 .  10/16/19 . (!) 122/56 .  10/02/19 . 137/70 .    Marland Kitchen Most recent eGFR/CrCl: No results found for: EGFR  No components found for: CRCL Current Barriers:  Marland Kitchen Knowledge Deficits related to basic understanding of hypertension pathophysiology and self care management . Knowledge Deficits related to understanding of medications prescribed for management of hypertension . Limited Social Support . Lacks social connections . Does not contact provider office for questions/concerns Case Manager Clinical Goal(s):  Marland Kitchen Over the next 120 days, patient will verbalize understanding of plan for hypertension management . Over the next 120 days, patient will attend all scheduled medical appointments: 09-21-2020 . Over the next 120 days, patient will demonstrate improved adherence to prescribed treatment plan for hypertension as evidenced by taking all medications as prescribed, monitoring and recording blood pressure as directed, adhering to low sodium/DASH diet . Over the next 120 days, patient will demonstrate improved health management independence as evidenced by checking blood pressure as directed and notifying PCP if SBP>160 or DBP > 90, taking all medications as prescribe, and adhering to a low sodium diet as discussed. . Over the next 120 days, patient will verbalize basic understanding of hypertension disease process and self health management plan as evidenced by compliance with medications, compliance with diet, and working with CCM team to manage health and well being  Interventions:  . Collaboration with Olin Hauser, DO regarding development and update of comprehensive plan of care as evidenced by provider attestation and  co-signature . Inter-disciplinary care team collaboration (see longitudinal plan of care) . Evaluation of current treatment plan related to hypertension self management and patient's adherence to plan as established by provider. 08-24-2020: The patient states her blood pressures have been good. She denies any issues with light headedness or dizziness. The patient states that she is eating like a "horse". Denies any safety concerns. Has a life alert system.  . Provided education to patient re: stroke prevention, s/s of heart attack and stroke, DASH diet, complications of uncontrolled blood pressure . Reviewed medications with patient and discussed importance of compliance. 08-24-2020: States compliance with medications regimen.  . Discussed plans with patient for ongoing care management follow up and provided patient with direct contact information for care management team. 08-24-2020: The patient wants to know about an assisted living facility. She has explored this before but feels it is time to look into it further. LCSW has an appointment with the patient on 08-26-2020. Reminded the patient of the LCSW appointment and will collaborate with the LCSW with the patients expressed needs.  She also wanted the [facility] to make sure the LCSW knows she is HOH.   . Advised patient, providing education and rationale, to monitor blood pressure daily and record, calling PCP for findings outside established parameters.  . Reviewed scheduled/upcoming provider appointments including: 09-21-2020 at 2 pm Patient Goals/Self-Care Activities . Over the next 120 days, patient will:  - Self administers medications as prescribed Attends all scheduled provider appointments- next appointment on 09-21-2020 Calls provider office for new concerns, questions, or BP outside discussed parameters Checks BP and records as discussed Follows a low  sodium diet/DASH diet - blood pressure trends reviewed - depression screen reviewed - home or  ambulatory blood pressure monitoring encouraged Follow Up Plan: Telephone follow up appointment with care management team member scheduled for: 10-26-2020 at 345 pm   Task: RNCM: Identify and Monitor Blood Pressure Elevation   Note:   Care Management Activities:    - blood pressure trends reviewed - depression screen reviewed - home or ambulatory blood pressure monitoring encouraged     Patient Care Plan: General Social Work (Adult)    Problem Identified: Quality of Life (General Plan of Care)     Long-Range Goal: Quality of Life Maintained   Start Date: 07/08/2020  Priority: Medium  Note:     Timeframe:  Long-Range Goal Priority:  Medium Start Date 3/23/222                        Expected End Date: 10/08/20            Follow Up Date- 08/26/20   - avoid negative self-talk - develop a personal safety plan - develop a plan to deal with triggers like holidays, anniversaries - exercise at least 2 to 3 times per week - have a plan for how to handle bad days - journal feelings and what helps to feel better or worse - spend time or talk with others at least 2 to 3 times per week - spend time or talk with others every day - watch for early signs of feeling worse - write in journal every day    Why is this important?    Keeping track of your progress will help your treatment team find the right mix of medicine and therapy for you.   Write in your journal every day.   Day-to-day changes in depression symptoms are normal. It may be more helpful to check your progress at the end of each week instead of every day.     Current Barriers:  . Financial constraints related to affording in home support or eventual long term care placement . Limited social support . Level of care concerns . ADL IADL limitations . Limited access to caregiver . Inability to perform ADL's independently . Inability to perform IADL's independently . Ongoing depression  Clinical Social Work Clinical  Goal(s):  Marland Kitchen Over the next 120 days, patient will work with SW to address concerns related to increasing depression and anxiety management and gaining additional support within the home until patient wishes to pursue LTC placement  Interventions: . Patient interviewed and appropriate assessments performed . Patient was informed that current CCM LCSW will be leaving position next month and her next CCM Social Work follow up visit will be with another LCSW. Patient was appreciative of support provided and receptive to news . Provided patient and family with information about long term care placement (ALF) and payment sources-out of pocket or Medicaid. Per patient and son, they have completed several tours of local ALF's but patient declined wanting to move further in this process at this time. Patient reports that she wishes to stay in her home for a slong as possible. LCSW also provided education on personal care service resources within the area. CCM LCSW has already placed patient on the wait list for [facility]. Son is agreeable to contact program to see where she is at on their wait list but understands that this wait list is very long.  . Discussed plans with patient for ongoing care management follow up  and provided patient with direct contact information for care management team . Advised patient to consider finding in home support. Patient found an aide that cost $20 per hour but is not able to afford this service. LCSW encouraged patient to network to see if she can find someone at a lesser rate. Family was educated on the stipend caregiver relief program at [facility] as well.  . Patient admits ongoing loneliness and depression. LCSW provided active and reflective listening throughout conversation. Patient shares that she puts on music when she is experiencing symptoms of depression and it helps. She was educated on other coping skills to implement into her daily routine to combat her  depression. Patient was receptive to emotional support and healthy self-care education that was provided.  . Patient does NOT want CCM LCSW to make referral for psychiatry or counseling. She shares that she does not want medication management for her depression as she has tried medications in the past and they only made her worse. She was strongly encouraged to consider implementing counseling but patient declined wanting this referral.  . Assisted patient/caregiver with obtaining information about health plan benefits . Provided education and assistance to client regarding Advanced Directives. . Provided education to patient/caregiver regarding level of care options. Marland Kitchen LCSW successfully coordinated care with Home Care Providers and placed patient on the wait list for [facility] program on 06/20/19. Patient was advised to contact program to inquire about where she is on the wait list currently. . Patient reports that her daughter in law continues to get her groceries for her and provides transportation for her medical appointments. She has two sons that assist with her care. She reports that her family visits her usually once per week on Sundays. . Patient shares that her arthritis and COPD have flared up recently. Coping skill education provided for pain management.  . Patient reports that she is stable and doing well. She reports that she uses her walker and cane when needed and has had no recent falls because she takes her time getting up and down.  . Patient reports that she is having recent difficulty with hearing over the phone even with her hearing aides.  Patient Self Care Activities:  . Self administers medications as prescribed . Attends all scheduled provider appointments . Unable to independently take care of self within the home and is in need of additional personal care service involvement.   Please see past updates related to this goal by clicking on the "Past Updates" button in the  selected goal     Task: Support and Maintain Acceptable Degree of Health, Comfort and Happiness   Note:   Care Management Activities:    - affirmation provided - community involvement promoted - expression of thoughts about present/future encouraged - independence in all possible areas promoted - life review by storytelling encouraged - patient strengths promoted - psychosocial concerns monitored - self-expression encouraged - sleep diary encouraged - sleep hygiene techniques encouraged - social relationships promoted - strategies to maintain hearing and/or vision promoted - strategies to maintain intimacy promoted - wellness behaviors promoted    Notes:    Patient Care Plan: PharmD - Med Mgmt    Problem Identified: Disease Progression     Long-Range Goal: Disease Progression Prevented or Minimized   Start Date: 06/22/2020  Expected End Date: 09/20/2020  This Visit's Progress: On track  Priority: High  Note:   Current Barriers:  Marland Kitchen Knowledge deficits related to basic COPD self care/management .  Knowledge deficit related to basic understanding of how to use inhalers and how inhaled medications work   . Limited Social Support . Financial limitations  Pharmacist Clinical Goal(s):  Marland Kitchen Over the next 90 days, patient will achieve adherence to monitoring guidelines and medication adherence to achieve therapeutic efficacy through collaboration with PharmD and provider.   Interventions: . 1:1 collaboration with Olin Hauser, DO regarding development and update of comprehensive plan of care as evidenced by provider attestation and co-signature . Inter-disciplinary care team collaboration (see longitudinal plan of care) . Perform chart review o Office Visit with PCP on 11/15 for Annual Physical o Office Visit with Pulmonologist on 1/25. Provider advised patient to: - Continue albuterol prn, trelegy one puff q day  - Nocturnal oxygen at 2 liters  Hypertension: . Current  treatment: o Amlodipine 5 mg daily o Chlorthalidone 25 mg daily o Losartan 100 mg daily . Patient unable to locate home blood pressure log today . Encourage patient to continue to check home BP, keeping log and bring log with her to medical appointments  Chronic Obstructive Pulmonary Disease: . Patient followed by [facility] Pulmonology for management of COPD . Current treatment: o Trelegy - 1 puff daily o Albuterol as needed o Nocturnal oxygen . Again counsel on importance of using maintenance inhaler (Trelegy) daily as directed and rescue (albuerol) inhaler as needed  Medication Adherence: . Reports using weekly pillbox as directed o Reports not currently able to incorporate Vitamin D into pillbox due to lack of room. Discuss option of obtaining weekly pillbox with larger slots  Medication Assistance: . Reports paid higher cost for refill of Trelegy at beginning of calendar year due to deductible, but that copayment is also difficult to afford . Counsel patient on requirements of Trelegy patient assistance program including out of pocket expenditure requirement . Patient reports currently has sufficient supply of Trelegy, including several sample inhalers as received from Pulmonology, but will discuss patient assistance further during next appointment with CM Pharmacist  Patient Goals/Self-Care Activities . Over the next 90 days, patient will:  . Attends all scheduled provider appointments . Calls pharmacy for medication refills . Calls provider office for new concerns or questions  Follow Up Plan: Telephone follow up appointment with care management team member scheduled for: 5/16 at 11:15 am     The patient verbalized understanding of instructions, educational materials, and care plan provided today and declined offer to receive copy of patient instructions, educational materials, and care plan.   Telephone follow up appointment with care management team member scheduled  for: 10-26-2020 at 345 pm  Noreene Larsson RN, MSN, [place] Mobile: [phone]

## 2020-08-26 ENCOUNTER — Ambulatory Visit: Payer: Medicare Other | Admitting: Licensed Clinical Social Worker

## 2020-08-26 DIAGNOSIS — I10 Essential (primary) hypertension: Secondary | ICD-10-CM | POA: Diagnosis not present

## 2020-08-26 DIAGNOSIS — F3342 Major depressive disorder, recurrent, in full remission: Secondary | ICD-10-CM

## 2020-08-26 DIAGNOSIS — N182 Chronic kidney disease, stage 2 (mild): Secondary | ICD-10-CM | POA: Diagnosis not present

## 2020-08-26 DIAGNOSIS — J449 Chronic obstructive pulmonary disease, unspecified: Secondary | ICD-10-CM | POA: Diagnosis not present

## 2020-08-26 DIAGNOSIS — I129 Hypertensive chronic kidney disease with stage 1 through stage 4 chronic kidney disease, or unspecified chronic kidney disease: Secondary | ICD-10-CM

## 2020-08-26 DIAGNOSIS — G894 Chronic pain syndrome: Secondary | ICD-10-CM

## 2020-08-27 NOTE — Patient Instructions (Signed)
Visit Information  Goals Addressed              This Visit's Progress     Patient Stated   .  "I really extra support at times because I'm alone" (pt-stated)   On track      Patient Self Care Activities:  . Self administers medications as prescribed . Attends all scheduled provider appointments . Continue utilizing healthy coping skills      Other   .  SW-Track and Manage My Symptoms-Depression        Timeframe:  Long-Range Goal Priority:  Medium Start Date 3/23/222                        Expected End Date: 10/15/20            Follow Up Date- 10/13/20    Patient Self Care Activities:  . Self administers medications as prescribed . Attends all scheduled provider appointments . Continue utilizing healthy coping skills       Patient verbalizes understanding of instructions provided today.   Telephone follow up appointment with care management team member scheduled for:10/13/20  Christa See, MSW, [email or web address] Phone [phone] 10:04 AM

## 2020-08-27 NOTE — Chronic Care Management (AMB) (Signed)
Chronic Care Management    Clinical Social Work Note  08/27/2020 Name: Candace Tapia MRN: [id number] DOB: 1935-10-03  Candace Tapia is a 85 y.o. year old female who is a primary care patient of Olin Hauser, DO. The CCM team was consulted to assist the patient with chronic disease management and/or care coordination needs related to: Mental Health Counseling and Resources.   Engaged with patient by telephone for follow up visit in response to provider referral for social work chronic care management and care coordination services.   Consent to Services:  The patient was given information about Chronic Care Management services, agreed to services, and gave verbal consent prior to initiation of services.  Please see initial visit note for detailed documentation.   Patient agreed to services and consent obtained.   Assessment: Review of patient past medical history, allergies, medications, and health status, including review of relevant consultants reports was performed today as part of a comprehensive evaluation and provision of chronic care management and care coordination services.     SDOH (Social Determinants of Health) assessments and interventions performed:    Advanced Directives Status: Not addressed in this encounter.  CCM Care Plan  Allergies  Allergen Reactions  . Tramadol Hcl Itching    Per pt it caused me to itch all over  . Statins     Not sure what exact med was; just made her feel bad (can't elaborate)  . Penicillin G Rash  . Penicillins Rash    Has patient had a PCN reaction causing immediate rash, facial/tongue/throat swelling, SOB or lightheadedness with hypotension: No Has patient had a PCN reaction causing severe rash involving mucus membranes or skin necrosis: No Has patient had a PCN reaction that required hospitalization: No Has patient had a PCN reaction occurring within the last 10 years: No If all of the above answers are "NO", then may  proceed with Cephalosporin use.  . Sulfa Antibiotics Other (See Comments) and Rash    Outpatient Encounter Medications as of 08/26/2020  Medication Sig Note  . acetaminophen (TYLENOL) 650 MG CR tablet Take 650 mg by mouth every 8 (eight) hours as needed for pain.   Marland Kitchen albuterol (VENTOLIN HFA) 108 (90 Base) MCG/ACT inhaler Inhale 1 puff into the lungs every 6 (six) hours as needed.    Marland Kitchen amLODipine (NORVASC) 5 MG tablet TAKE 1 TABLET(5 MG) BY MOUTH DAILY   . aspirin 81 MG chewable tablet Chew 81 mg by mouth daily as needed.  (Patient not taking: Reported on 07/21/2020) 01/01/2019: Takes every once in a while  . chlorthalidone (HYGROTON) 25 MG tablet TAKE 1 TABLET(25 MG) BY MOUTH DAILY   . Cholecalciferol 25 MCG (1000 UT) capsule Vitamin D3 25 mcg (1,000 unit) capsule  1 po qd   . fluticasone (FLONASE) 50 MCG/ACT nasal spray Place 1 spray into both nostrils as needed.    . Fluticasone-Umeclidin-Vilant (TRELEGY ELLIPTA) 100-62.5-25 MCG/INH AEPB Inhale 1 puff into the lungs daily.   Marland Kitchen gabapentin (NEURONTIN) 400 MG capsule TAKE 1 CAPSULE(400 MG) BY MOUTH TWICE DAILY   . losartan (COZAAR) 100 MG tablet TAKE 1 TABLET(100 MG) BY MOUTH DAILY   . meclizine (ANTIVERT) 25 MG tablet Take 0.5 tablets (12.5 mg total) by mouth every 8 (eight) hours as needed for dizziness.   . Multiple Vitamins-Minerals (MULTIVITAMIN WOMEN 50+) TABS Take 1 tablet by mouth daily.    Marland Kitchen NEXIUM 20 MG capsule Take 1 capsule (20 mg total) by mouth daily.   Marland Kitchen  potassium chloride (MICRO-K) 10 MEQ CR capsule TAKE 1 CAPSULE BY MOUTH DAILY. MAY OPEN CAPSULE AND SPRINKLE OVER SOFT FOOD OR SWALLOW WHOLE   . Spacer/Aero-Holding Chambers (AEROCHAMBER PLUS) inhaler Use as instructed   . [DISCONTINUED] fexofenadine (ALLEGRA ALLERGY) 180 MG tablet Take 180 mg by mouth as needed. Reported on 09/29/2015    No facility-administered encounter medications on file as of 08/26/2020.    Patient Active Problem List   Diagnosis Date Noted  . Obesity (BMI  35.0-39.9 without comorbidity) 01/09/2019  . Peripheral polyneuropathy 12/27/2017  . Vestibular disorder 03/20/2017  . CKD (chronic kidney disease), stage II 12/09/2016  . Chronic venous insufficiency 11/25/2016  . Bilateral lower extremity edema 10/27/2016  . Pre-diabetes 08/12/2016  . Carotid bruit 02/09/2016  . History of left breast cancer 02/09/2016  . Long term current use of opiate analgesic 02/08/2016  . Long term prescription opiate use 02/08/2016  . Drug level above therapeutic range 02/08/2016  . Chronic low back pain (Location of Tertiary source of pain) (Bilateral) (R>L) 02/08/2016  . Chronic knee pain (Location of Secondary source of pain) (Bilateral) (L>R) 02/08/2016  . History bilateral shoulder replacement 02/08/2016  . Chronic hip pain (Location of Primary Source of Pain) (Bilateral) (R>L) 01/07/2016  . Complete rupture of rotator cuff 01/07/2016  . Hyperlipidemia 01/07/2016  . Osteopenia 01/07/2016  . Centrilobular emphysema ([place]) 07/21/2015  . DDD (degenerative disc disease), lumbar 03/05/2015  . Lumbar facet syndrome (Bilateral) (R>L) 03/05/2015  . Sacroiliac joint dysfunction 03/05/2015  . Osteoarthritis of hip (Bilateral) (R>L) 03/05/2015  . DJD of shoulder 03/05/2015  . Abnormal finding on mammography 03/03/2015  . Back pain with radiation 03/03/2015  . Major depression in full remission ([place]) 03/03/2015  . Dry skin 03/03/2015  . Hypokalemia 03/03/2015  . Drug-induced myopathy 03/03/2015  . Gastro-esophageal reflux disease without esophagitis 03/03/2015  . Allergic rhinitis, seasonal 03/03/2015  . Osteoarthritis of multiple joints 03/03/2015  . Benign hypertension with CKD (chronic kidney disease), stage II 11/28/2014  . Chronic pain 11/28/2014  . Vitamin D deficiency 11/28/2014  . Airway hyperreactivity 09/18/2013  . Microscopic hematuria 12/20/2011    Conditions to be addressed/monitored: Anxiety and Depression; Mental Health Concerns   Care Plan :  General Social Work (Adult)  Updates made by Rebekah Chesterfield, LCSW since 08/27/2020 12:00 AM    Problem: Quality of Life (General Plan of Care)     Long-Range Goal: Quality of Life Maintained   Start Date: 07/08/2020  This Visit's Progress: On track  Priority: Medium  Note:   Timeframe:  Long-Range Goal Priority:  Medium Start Date 3/23/222                        Expected End Date: 12/16/20            Follow Up Date- 10/13/20   Current Barriers:  . Financial constraints related to affording in home support or eventual long term care placement . Limited social support . Level of care concerns . ADL IADL limitations . Limited access to caregiver . Inability to perform ADL's independently . Inability to perform IADL's independently . Ongoing depression  Clinical Social Work Clinical Goal(s):  Marland Kitchen Over the next 120 days, patient will work with SW to address concerns related to increasing depression and anxiety management and gaining additional support within the home until patient wishes to pursue LTC placement  Interventions: . Patient interviewed and appropriate assessments performed . Patient endorses long hx of depression and  anxiety symptoms triggered by grief of loved ones and chronic medical conditions. Patient reports that she has difficulty with hearing over the phone and her eyesight has worsened; however, feels depression/anxiety symptoms are managed at this time . Patient enjoys listening to music to assist with management of symptoms. She is aware of the variety in audiobooks, as well . Patient utilizes Med Alert to assist with promoting safety in the event of a fall . CCM LCSW provided education to patient/caregiver regarding level of care options. CCM LCSW agreed to mail out information regarding ALF, Applying for special medicaid to assist with funding, and transportation options for pt and son to review . Patient reports that her daughter in law continues to get her  groceries for her and provides transportation for her medical appointments. She has two sons that assist with her care. She reports that her family visits her usually once per week on Sundays. . Coping skill education provided for management of symptoms.   Patient Self Care Activities:  . Self administers medications as prescribed . Attends all scheduled provider appointments . Continue utilizing healthy coping skills       Follow Up Plan: SW will follow up with patient by phone over the next 60 days on 10/13/20      Christa See, MSW, [email or web address] Phone [phone] 10:02 AM

## 2020-08-31 ENCOUNTER — Ambulatory Visit: Payer: Medicare Other | Admitting: Family Medicine

## 2020-08-31 ENCOUNTER — Ambulatory Visit: Payer: Medicare Other | Admitting: Pharmacist

## 2020-08-31 DIAGNOSIS — F3342 Major depressive disorder, recurrent, in full remission: Secondary | ICD-10-CM | POA: Diagnosis not present

## 2020-08-31 DIAGNOSIS — J449 Chronic obstructive pulmonary disease, unspecified: Secondary | ICD-10-CM

## 2020-08-31 DIAGNOSIS — I10 Essential (primary) hypertension: Secondary | ICD-10-CM | POA: Diagnosis not present

## 2020-08-31 DIAGNOSIS — I129 Hypertensive chronic kidney disease with stage 1 through stage 4 chronic kidney disease, or unspecified chronic kidney disease: Secondary | ICD-10-CM | POA: Diagnosis not present

## 2020-08-31 DIAGNOSIS — N182 Chronic kidney disease, stage 2 (mild): Secondary | ICD-10-CM | POA: Diagnosis not present

## 2020-08-31 NOTE — Patient Instructions (Signed)
Visit Information  PATIENT GOALS: Goals Addressed            This Visit's Progress   . Pharmacy Goals       Please remember to discuss your questions about Trelegy inhaler with Dr. Raul Del at your upcoming appointment.  Please check your home blood pressure, keep a log of the results and bring this with you to your medical appointments.   Feel free to call me with any questions or concerns. I look forward to our next call!  Harlow Asa, PharmD, [place] [phone]       The patient verbalized understanding of instructions, educational materials, and care plan provided today and declined offer to receive copy of patient instructions, educational materials, and care plan.   Telephone follow up appointment with care management team member scheduled for: 7/6 at 11:15 am

## 2020-08-31 NOTE — Chronic Care Management (AMB) (Signed)
Chronic Care Management Pharmacy Note  08/31/2020 Name:  Candace Tapia MRN:  [id number] DOB:  12-18-1935  Subjective: Candace Tapia is an 85 y.o. year old female who is a primary patient of Olin Hauser, DO.  The CCM team was consulted for assistance with disease management and care coordination needs.    Engaged with patient by telephone for follow up visit in response to provider referral for pharmacy case management and/or care coordination services.   Consent to Services:  The patient was given information about Chronic Care Management services, agreed to services, and gave verbal consent prior to initiation of services.  Please see initial visit note for detailed documentation.   Patient Care Team: Olin Hauser, DO as PCP - General (Family Medicine) Erby Pian, MD as Referring Physician (Specialist) Lequita Asal, MD as Referring Physician (Hematology and Oncology) Aalaysia Liggins, Virl Diamond, RPH-CPP as Pharmacist Hall Busing Nobie Putnam, RN as Case Manager (General Practice) Rebekah Chesterfield, LCSW as Social Worker (Licensed Clinical Social Worker)  Recent office visits: None  Hospital visits: None in previous 6 months  Objective:  Lab Results  Component Value Date   CREATININE 0.80 02/21/2020   CREATININE 0.78 01/02/2019   CREATININE 0.90 10/02/2018   Lab Results  Component Value Date/Time   VD25OH 26 (L) 02/21/2020 08:31 AM   VD25OH 38 12/05/2016 12:07 PM    Social History   Tobacco Use  Smoking Status Former Smoker  . Packs/day: 2.00  . Years: 40.00  . Pack years: 80.00  . Quit date: 04/19/1993  . Years since quitting: 27.3  Smokeless Tobacco Never Used   BP Readings from Last 3 Encounters:  03/02/20 (!) 127/52  10/16/19 (!) 122/56  10/02/19 137/70   Pulse Readings from Last 3 Encounters:  03/02/20 86  10/16/19 94  08/18/19 95   Wt Readings from Last 3 Encounters:  07/21/20 210 lb (95.3 kg)  03/02/20 220 lb (99.8  kg)  10/16/19 216 lb (98 kg)    Assessment: Review of patient past medical history, allergies, medications, health status, including review of consultants reports, laboratory and other test data, was performed as part of comprehensive evaluation and provision of chronic care management services.   SDOH:  (Social Determinants of Health) assessments and interventions performed: none   CCM Care Plan  Allergies  Allergen Reactions  . Tramadol Hcl Itching    Per pt it caused me to itch all over  . Statins     Not sure what exact med was; just made her feel bad (can't elaborate)  . Penicillin G Rash  . Penicillins Rash    Has patient had a PCN reaction causing immediate rash, facial/tongue/throat swelling, SOB or lightheadedness with hypotension: No Has patient had a PCN reaction causing severe rash involving mucus membranes or skin necrosis: No Has patient had a PCN reaction that required hospitalization: No Has patient had a PCN reaction occurring within the last 10 years: No If all of the above answers are "NO", then may proceed with Cephalosporin use.  . Sulfa Antibiotics Other (See Comments) and Rash    Medications Reviewed Today    Reviewed by Vella Raring, RPH-CPP (Pharmacist) on 08/31/20 at 1137  Med List Status: <None>  Medication Order Taking? Sig Documenting Provider Last Dose Status Informant  acetaminophen (TYLENOL) 650 MG CR tablet [id number] Yes Take 650 mg by mouth every 8 (eight) hours as needed for pain. [provider] Taking Active Self  albuterol (VENTOLIN  HFA) 108 (90 Base) MCG/ACT inhaler [id number] Yes Inhale 1 puff into the lungs every 6 (six) hours as needed.  [provider] Taking Active Self           Med Note Fabio Bering   Sat May 13, 2017 10:07 AM)    amLODipine (NORVASC) 5 MG tablet [id number]  TAKE 1 TABLET(5 MG) BY MOUTH DAILY Olin Hauser, DO  Active   aspirin 81 MG chewable tablet [id number]  Chew 81 mg by mouth  daily as needed.   Patient not taking: Reported on 07/21/2020   [provider]  Active            Med Note (HILL, TIFFANY A   Tue Jan 01, 2019  3:30 PM) Takes every once in a while  chlorthalidone (HYGROTON) 25 MG tablet [id number]  TAKE 1 TABLET(25 MG) BY MOUTH DAILY Olin Hauser, DO  Active   Cholecalciferol 25 MCG (1000 UT) capsule [id number]  Vitamin D3 25 mcg (1,000 unit) capsule  1 po qd [provider]  Active         Discontinued 08/18/19 1134            Med Note Ulla Potash S   Sat May 13, 2017 10:03 AM)    fluticasone (FLONASE) 50 MCG/ACT nasal spray [id number]  Place 1 spray into both nostrils as needed.  [provider]  Active Self  Fluticasone-Umeclidin-Vilant (TRELEGY ELLIPTA) 100-62.5-25 MCG/INH AEPB [id number]  Inhale 1 puff into the lungs daily. [provider]  Active   gabapentin (NEURONTIN) 400 MG capsule [id number] Yes TAKE 1 CAPSULE(400 MG) BY MOUTH TWICE DAILY Olin Hauser, DO Taking Active   losartan (COZAAR) 100 MG tablet [id number]  TAKE 1 TABLET(100 MG) BY MOUTH DAILY Karamalegos, Devonne Doughty, DO  Active   meclizine (ANTIVERT) 25 MG tablet [id number]  Take 0.5 tablets (12.5 mg total) by mouth every 8 (eight) hours as needed for dizziness. Olin Hauser, DO  Active   Multiple Vitamins-Minerals (MULTIVITAMIN WOMEN 50+) TABS [id number] Yes Take 1 tablet by mouth daily.  [provider] Taking Active Self  NEXIUM 20 MG capsule [id number] Yes Take 1 capsule (20 mg total) by mouth daily. Arlis Porta., MD Taking Active Self  potassium chloride (MICRO-K) 10 MEQ CR capsule [id number] Yes TAKE 1 CAPSULE BY MOUTH DAILY. MAY OPEN CAPSULE AND SPRINKLE OVER SOFT FOOD OR SWALLOW WHOLE Parks Ranger, Devonne Doughty, DO Taking Active   Spacer/Aero-Holding Chambers (AEROCHAMBER PLUS) inhaler [id number]  Use as instructed Melynda Ripple, MD  Active   Med List Note Ignatius Specking, RN 02/08/16 1505): UDS  02/08/16 meds due 03/09/16          Patient Active Problem List   Diagnosis Date Noted  . Obesity (BMI 35.0-39.9 without comorbidity) 01/09/2019  . Peripheral polyneuropathy 12/27/2017  . Vestibular disorder 03/20/2017  . CKD (chronic kidney disease), stage II 12/09/2016  . Chronic venous insufficiency 11/25/2016  . Bilateral lower extremity edema 10/27/2016  . Pre-diabetes 08/12/2016  . Carotid bruit 02/09/2016  . History of left breast cancer 02/09/2016  . Long term current use of opiate analgesic 02/08/2016  . Long term prescription opiate use 02/08/2016  . Drug level above therapeutic range 02/08/2016  . Chronic low back pain (Location of Tertiary source of pain) (Bilateral) (R>L) 02/08/2016  . Chronic knee pain (Location of Secondary source of pain) (Bilateral) (L>R) 02/08/2016  . History bilateral shoulder replacement 02/08/2016  . Chronic hip pain (Location of  Primary Source of Pain) (Bilateral) (R>L) 01/07/2016  . Complete rupture of rotator cuff 01/07/2016  . Hyperlipidemia 01/07/2016  . Osteopenia 01/07/2016  . Centrilobular emphysema ([place]) 07/21/2015  . DDD (degenerative disc disease), lumbar 03/05/2015  . Lumbar facet syndrome (Bilateral) (R>L) 03/05/2015  . Sacroiliac joint dysfunction 03/05/2015  . Osteoarthritis of hip (Bilateral) (R>L) 03/05/2015  . DJD of shoulder 03/05/2015  . Abnormal finding on mammography 03/03/2015  . Back pain with radiation 03/03/2015  . Major depression in full remission ([place]) 03/03/2015  . Dry skin 03/03/2015  . Hypokalemia 03/03/2015  . Drug-induced myopathy 03/03/2015  . Gastro-esophageal reflux disease without esophagitis 03/03/2015  . Allergic rhinitis, seasonal 03/03/2015  . Osteoarthritis of multiple joints 03/03/2015  . Benign hypertension with CKD (chronic kidney disease), stage II 11/28/2014  . Chronic pain 11/28/2014  . Vitamin D deficiency 11/28/2014  . Airway hyperreactivity 09/18/2013  . Microscopic hematuria  12/20/2011    Immunization History  Administered Date(s) Administered  . Fluad Quad(high Dose 65+) 01/01/2019, 03/02/2020  . Influenza, High Dose Seasonal PF 02/24/2015, 02/09/2016, 12/12/2016, 01/23/2018  . Influenza-Unspecified 01/16/2014  . Pneumococcal Conjugate-13 08/29/2014  . Pneumococcal Polysaccharide-23 04/19/2007  . Tdap 01/17/2013  . Zoster 08/29/2014    Conditions to be addressed/monitored: HTN COPD and Vitamin D deficiency  Care Plan : PharmD - Med Mgmt  Updates made by Vella Raring, RPH-CPP since 08/31/2020 12:00 AM    Problem: Disease Progression     Long-Range Goal: Disease Progression Prevented or Minimized   Start Date: 06/22/2020  Expected End Date: 09/20/2020  Recent Progress: On track  Priority: High  Note:   Current Barriers:  Marland Kitchen Knowledge deficits related to basic COPD self care/management . Knowledge deficit related to basic understanding of how to use inhalers and how inhaled medications work   . Limited Social Support . Financial limitations  Pharmacist Clinical Goal(s):  Marland Kitchen Over the next 90 days, patient will achieve adherence to monitoring guidelines and medication adherence to achieve therapeutic efficacy through collaboration with PharmD and provider.   Interventions: . 1:1 collaboration with Olin Hauser, DO regarding development and update of comprehensive plan of care as evidenced by provider attestation and co-signature . Inter-disciplinary care team collaboration (see longitudinal plan of care)  Hypertension: . Current treatment: o Amlodipine 5 mg daily o Chlorthalidone 25 mg daily o Losartan 100 mg daily . Note patient has a home upper arm BP monitor . Reports last checked home BP last week: 137/76, HR 82 . Encourage patient to continue to check home BP, keeping log and bring log with her to medical appointments  Chronic Obstructive Pulmonary Disease: . Patient followed by [facility] Pulmonology for management of  COPD . Current treatment: o Trelegy - 1 puff daily o Albuterol as needed o Nocturnal oxygen . Have counseled on rational for using maintenance inhaler (Trelegy) daily as directed and rescue (albuerol) inhaler as needed o Today patient reports she uses Trelegy most days, rather than daily. Reports that she takes Trelegy less frequently as she is concerned about side effects that she has seen on TV commercials and feels like her chest gets tighter when uses daily, but not when uses as she currently does - Confirms that she will discuss how she is using Trelegy/concerns about chest tightness with Pulmonologist during upcoming appointment on 5/31  Medication Adherence: . Reports using weekly pillbox as directed o Reports not currently able to incorporate vitamins into pillbox due to lack of room. Have discussed option of obtaining weekly  pillbox with larger slots  Medication Assistance: . Have counseled patient on requirements of Trelegy patient assistance program including out of pocket expenditure requirement . Patient interested in discussing patient assistance further during next appointment with CM Pharmacist  Patient Goals/Self-Care Activities . Over the next 90 days, patient will:  . Attends all scheduled provider appointments o Next appointment with Pulmonology on 5/31 o Next appointment with PCP on 6/6 . Calls pharmacy for medication refills . Calls provider office for new concerns or questions  Follow Up Plan: Telephone follow up appointment with care management team member scheduled for: 7/6 at 11:15 am     Patient's preferred pharmacy is:  [facility] [address], [place] - [address] [place] [address] Phone: [phone] Fax: Solomon Putnam, [address] [address] Phone: [phone] Fax: [place], [place] [address] Phone: [phone] Fax: [phone]  Uses pill box? Yes  Follow Up:  Patient agrees to Care Plan and Follow-up.  Harlow Asa, PharmD, [place], CPP Clinical Pharmacist [facility] [phone]

## 2020-09-15 DIAGNOSIS — R0600 Dyspnea, unspecified: Secondary | ICD-10-CM | POA: Diagnosis not present

## 2020-09-15 DIAGNOSIS — J432 Centrilobular emphysema: Secondary | ICD-10-CM | POA: Diagnosis not present

## 2020-09-20 DIAGNOSIS — J449 Chronic obstructive pulmonary disease, unspecified: Secondary | ICD-10-CM | POA: Diagnosis not present

## 2020-09-21 ENCOUNTER — Ambulatory Visit (INDEPENDENT_AMBULATORY_CARE_PROVIDER_SITE_OTHER): Payer: Medicare Other | Admitting: Family Medicine

## 2020-09-21 ENCOUNTER — Encounter: Payer: Self-pay | Admitting: Family Medicine

## 2020-09-21 ENCOUNTER — Other Ambulatory Visit: Payer: Self-pay | Admitting: Family Medicine

## 2020-09-21 ENCOUNTER — Other Ambulatory Visit: Payer: Self-pay

## 2020-09-21 VITALS — BP 113/42 | HR 92 | Ht 65.5 in | Wt 222.0 lb

## 2020-09-21 DIAGNOSIS — F3342 Major depressive disorder, recurrent, in full remission: Secondary | ICD-10-CM

## 2020-09-21 DIAGNOSIS — J432 Centrilobular emphysema: Secondary | ICD-10-CM

## 2020-09-21 DIAGNOSIS — M159 Polyosteoarthritis, unspecified: Secondary | ICD-10-CM

## 2020-09-21 DIAGNOSIS — E559 Vitamin D deficiency, unspecified: Secondary | ICD-10-CM

## 2020-09-21 DIAGNOSIS — R7303 Prediabetes: Secondary | ICD-10-CM

## 2020-09-21 DIAGNOSIS — E782 Mixed hyperlipidemia: Secondary | ICD-10-CM

## 2020-09-21 DIAGNOSIS — E876 Hypokalemia: Secondary | ICD-10-CM | POA: Diagnosis not present

## 2020-09-21 DIAGNOSIS — N182 Chronic kidney disease, stage 2 (mild): Secondary | ICD-10-CM

## 2020-09-21 DIAGNOSIS — M8949 Other hypertrophic osteoarthropathy, multiple sites: Secondary | ICD-10-CM | POA: Diagnosis not present

## 2020-09-21 DIAGNOSIS — Z0000 Encounter for general adult medical examination without abnormal findings: Secondary | ICD-10-CM

## 2020-09-21 DIAGNOSIS — I129 Hypertensive chronic kidney disease with stage 1 through stage 4 chronic kidney disease, or unspecified chronic kidney disease: Secondary | ICD-10-CM

## 2020-09-21 LAB — POCT GLYCOSYLATED HEMOGLOBIN (HGB A1C): Hemoglobin A1C: 5.7 % — AB (ref 4.0–5.6)

## 2020-09-21 MED ORDER — POTASSIUM CHLORIDE ER 10 MEQ PO CPCR: 10.0000 meq | ORAL_CAPSULE | Freq: Every day | ORAL | 1 refills | Status: DC

## 2020-09-21 MED ORDER — AMLODIPINE BESYLATE 5 MG PO TABS: 5.0000 mg | ORAL_TABLET | Freq: Every day | ORAL | 3 refills | Status: DC

## 2020-09-21 MED ORDER — CHLORTHALIDONE 25 MG PO TABS: 25.0000 mg | ORAL_TABLET | Freq: Every day | ORAL | 3 refills | Status: DC

## 2020-09-21 MED ORDER — LOSARTAN POTASSIUM 100 MG PO TABS: 100.0000 mg | ORAL_TABLET | Freq: Every day | ORAL | 3 refills | Status: DC

## 2020-09-21 NOTE — Assessment & Plan Note (Signed)
Remains improved mood, in remission - she does seem lonely and frustrated but she seems to be coping well, has limited social support. Chronic history of MDD  Follow-up as planned

## 2020-09-21 NOTE — Assessment & Plan Note (Signed)
Chronic osteoarthritis multiple joints Chronic joint pain Limited improvement off of regular NSAID therapy due to CKD risk Trial muscle relaxant In past on hydrocodone with good results Allergy to tramadol She declines to return to pain management has done injections and therapy without relief Discussed today that long term opiates with hydrocodone is not recommended management for osteoarthritis, but in future if no other options available, we could consider a PRN short term option only.

## 2020-09-21 NOTE — Assessment & Plan Note (Signed)
Chronic problem, currently stable without flare Followed by [facility] On Symbicort and Albuterol

## 2020-09-21 NOTE — Patient Instructions (Addendum)
Thank you for coming to the office today.  Recommend to start taking Tylenol Extra Strength 500mg  tabs - take 1 to 2 tabs per dose (max 1000mg ) every 6-8 hours for pain, max 24 hour daily dose is 6 tablets or 3000mg . In the future you can repeat the same everyday Tylenol course for 1-2 weeks at a time.   Unfortunately we cannot do the long term Hydrocodone.  DUE for FASTING BLOOD WORK (no food or drink after midnight before the lab appointment, only water or coffee without cream/sugar on the morning of)  SCHEDULE "Lab Only" visit in the morning at the clinic for lab draw in 5 MONTHS   - Make sure Lab Only appointment is at about 1 week before your next appointment, so that results will be available  For Lab Results, once available within 2-3 days of blood draw, you can can log in to MyChart online to view your results and a brief explanation. Also, we can discuss results at next follow-up visit.    Please schedule a Follow-up Appointment to: Return in about 5 months (around 02/21/2021) for 5 month fasting lab only then 1 week later Annual Physical.  If you have any other questions or concerns, please feel free to call the office or send a message through [place]. You may also schedule an earlier appointment if necessary.  Additionally, you may be receiving a survey about your experience at our office within a few days to 1 week by e-mail or mail. We value your feedback.  Nobie Putnam, DO [place]

## 2020-09-21 NOTE — Assessment & Plan Note (Signed)
Well controlled PreDM A1c 5.7 stable in past few years 5.6 to 5.7 Concern with obesity, HTN, HLD  Concern with neuropathy symptoms  Plan:  1. Not on any therapy currently  2. Encourage improved lifestyle - low carb, low sugar diet, reduce portion size, continue improving regular exercise

## 2020-09-21 NOTE — Assessment & Plan Note (Signed)
Controlled HTN  Some dizziness is chronic, seems unrelated to medication side effect or HTN - Home BP readings improved Complication with CKD-II    Plan:  Continue current BP regimen Losartan 100mg  daily, Amlodipine 5mg , Chlorthalidone 25mg  daily Encourage improved lifestyle - low sodium diet, regular exercise Continue monitor BP outside office, bring readings to next visit, if persistently >140/90 or new symptoms notify office sooner

## 2020-09-21 NOTE — Progress Notes (Signed)
Subjective:    Patient ID: Candace Tapia, female    DOB: Sep 14, 1935, 85 y.o.   MRN: [id number]  Candace Tapia is a 85 y.o. female presenting on 09/21/2020 for Prediabetes, Hypertension, and COPD   HPI   Pre-Diabetes/ obesity BMI >36 Reports no concerns. Last A1ctrend 5.7 Meds:Never on meds Limited diet and exercise, admits difficulty exercising due to back hip pain arthritis, and often still eating sweets at time Denies hypoglycemia, polyuria, visual changes, numbness or tingling.  CHRONIC HTN/ Peripheral Edema / Hypokalemia Improved BP control and edema Current Meds -Losartan 100mg  daily, Chlorthalidone 25mg  daily, Amlodipine 5mg  daily Reports good compliance, took meds today. Taking Potassium6mEq daily now  HYPERLIPIDEMIA: - Reports concerns she cannot take cholesterol statin medicine due to myopathy  Last lipid panel 02/2020, improved LDL to 108 prior 110s-130  Vitamin D Last lab result 26, taking OTC vitamin D3 1,000 most days.  Osteoarthritis, multiple joints Chronic joint pain multiple joints. Prior history of shoulder replacement surgery in past She had been on Hydrocodone years ago and did well only short term. She takes Tylenol Ext STr 500mg  x 2 twice a day asking about taking additional dose.  Chronic Vestibular disorder Followed by [facility] She has had some dizziness episodes lightheadedness Needs re order on Meclizine  Recurrent Depression, - in remission Reports she has prior history of some depressed mood but overall has managed to cope with it for long time and currently doing well. She says she is alone. She has limited family members around. She has some friends, one with alzheimer's dementia. Cannot tolerate medication, remains off  Centrilobular Emphysema(COPD) Followed by [facility], last visit 09/15/20 On oxygen at night, nocturnal up to 2L Using inhalersContinueSymbicort 160/4.5 two puffs bid, albuterolPRN She  is doing well but often can get short of breath with exertion, worse with mask Has some swelling.   Depression screen [facility] 2/9 09/21/2020 07/21/2020 03/02/2020  Decreased Interest 2 3 0  Down, Depressed, Hopeless 2 3 0  PHQ - 2 Score 4 6 0  Altered sleeping 0 0 0  Tired, decreased energy 3 3 0  Change in appetite 0 0 0  Feeling bad or failure about yourself  0 3 -  Trouble concentrating 0 1 0  Moving slowly or fidgety/restless 0 0 0  Suicidal thoughts - 0 0  PHQ-9 Score 7 13 0  Difficult doing work/chores Somewhat difficult Somewhat difficult Not difficult at all  Some recent data might be hidden   No flowsheet data found.    Social History   Tobacco Use  . Smoking status: Former Smoker    Packs/day: 2.00    Years: 40.00    Pack years: 80.00    Quit date: 04/19/1993    Years since quitting: 27.4  . Smokeless tobacco: Never Used  Substance Use Topics  . Alcohol use: No    Alcohol/week: 0.0 standard drinks  . Drug use: No    Review of Systems Per HPI unless specifically indicated above     Objective:    BP (!) 113/42   Pulse 92   Ht 5' 5.5" (1.664 m)   Wt 222 lb (100.7 kg)   SpO2 95%   BMI 36.38 kg/m   Wt Readings from Last 3 Encounters:  09/21/20 222 lb (100.7 kg)  07/21/20 210 lb (95.3 kg)  03/02/20 220 lb (99.8 kg)    Physical Exam Vitals and nursing note reviewed.  Constitutional:  General: She is not in acute distress.    Appearance: She is well-developed. She is not diaphoretic.     Comments: Well-appearing, comfortable, cooperative  HENT:     Head: Normocephalic and atraumatic.  Eyes:     General:        Right eye: No discharge.        Left eye: No discharge.     Conjunctiva/sclera: Conjunctivae normal.  Cardiovascular:     Rate and Rhythm: Normal rate.  Pulmonary:     Effort: Pulmonary effort is normal.  Skin:    General: Skin is warm and dry.     Findings: No erythema or rash.  Neurological:     Mental Status: She is alert and oriented  to person, place, and time.  Psychiatric:        Behavior: Behavior normal.     Comments: Well groomed, good eye contact, normal speech and thoughts       Results for orders placed or performed in visit on 09/21/20  POCT HgB A1C  Result Value Ref Range   Hemoglobin A1C 5.7 (A) 4.0 - 5.6 %      Assessment & Plan:   Problem List Items Addressed This Visit    Pre-diabetes - Primary    Well controlled PreDM A1c 5.7 stable in past few years 5.6 to 5.7 Concern with obesity, HTN, HLD  Concern with neuropathy symptoms  Plan:  1. Not on any therapy currently  2. Encourage improved lifestyle - low carb, low sugar diet, reduce portion size, continue improving regular exercise      Relevant Orders   POCT HgB A1C (Completed)   Osteoarthritis of multiple joints    Chronic osteoarthritis multiple joints Chronic joint pain Limited improvement off of regular NSAID therapy due to CKD risk Trial muscle relaxant In past on hydrocodone with good results Allergy to tramadol She declines to return to pain management has done injections and therapy without relief Discussed today that long term opiates with hydrocodone is not recommended management for osteoarthritis, but in future if no other options available, we could consider a PRN short term option only.      Major depression in full remission ([place])    Remains improved mood, in remission - she does seem lonely and frustrated but she seems to be coping well, has limited social support. Chronic history of MDD  Follow-up as planned      Hypokalemia   Relevant Medications   potassium chloride (MICRO-K) 10 MEQ CR capsule   Centrilobular emphysema (HCC)    Chronic problem, currently stable without flare Followed by [facility] On Symbicort and Albuterol      Benign hypertension with CKD (chronic kidney disease), stage II    Controlled HTN  Some dizziness is chronic, seems unrelated to medication side effect or HTN - Home BP  readings improved Complication with CKD-II    Plan:  Continue current BP regimen Losartan 100mg  daily, Amlodipine 5mg , Chlorthalidone 25mg  daily Encourage improved lifestyle - low sodium diet, regular exercise Continue monitor BP outside office, bring readings to next visit, if persistently >140/90 or new symptoms notify office sooner      Relevant Medications   amLODipine (NORVASC) 5 MG tablet   chlorthalidone (HYGROTON) 25 MG tablet   losartan (COZAAR) 100 MG tablet        Meds ordered this encounter  Medications  . amLODipine (NORVASC) 5 MG tablet    Sig: Take 1 tablet (5 mg total) by mouth daily.  Dispense:  90 tablet    Refill:  3  . chlorthalidone (HYGROTON) 25 MG tablet    Sig: Take 1 tablet (25 mg total) by mouth daily.    Dispense:  90 tablet    Refill:  3  . losartan (COZAAR) 100 MG tablet    Sig: Take 1 tablet (100 mg total) by mouth daily.    Dispense:  90 tablet    Refill:  3  . potassium chloride (MICRO-K) 10 MEQ CR capsule    Sig: Take 1 capsule (10 mEq total) by mouth daily. May open capsule and sprinkle over soft food or swallow whole    Dispense:  90 capsule    Refill:  1      Follow up plan: Return in about 5 months (around 02/21/2021) for 5 month fasting lab only then 1 week later Annual Physical.  Future labs ordered for 02/16/21   Nobie Putnam, [facility] 09/21/2020, 2:16 PM

## 2020-10-13 ENCOUNTER — Ambulatory Visit (INDEPENDENT_AMBULATORY_CARE_PROVIDER_SITE_OTHER): Payer: Medicare Other | Admitting: Licensed Clinical Social Worker

## 2020-10-13 DIAGNOSIS — F3342 Major depressive disorder, recurrent, in full remission: Secondary | ICD-10-CM | POA: Diagnosis not present

## 2020-10-13 DIAGNOSIS — G894 Chronic pain syndrome: Secondary | ICD-10-CM

## 2020-10-13 NOTE — Chronic Care Management (AMB) (Signed)
    Clinical Social Work  Chronic Care Management   Phone Outreach    10/13/2020 Name: Candace Tapia MRN: [id number] DOB: January 08, 1936  Candace Tapia is a 85 y.o. year old female who is a primary care patient of Olin Hauser, DO .   F/U phone call today to assess needs, and progress with care plan goals.  CCM LCSW spoke with patient's son. Stated that patient does not have power in home and is unable to keep phone appointment today.  Plan:Will reach out to patient again in the next 14 days.   Review of patient status, including review of consultants reports, relevant laboratory and other test results, and collaboration with appropriate care team members and the patient's provider was performed as part of comprehensive patient evaluation and provision of care management services.    Christa See, MSW, [facility] Care Management [email or web address] Phone [phone] 11:25 AM

## 2020-10-20 DIAGNOSIS — J449 Chronic obstructive pulmonary disease, unspecified: Secondary | ICD-10-CM | POA: Diagnosis not present

## 2020-10-21 ENCOUNTER — Telehealth: Payer: Medicare Other

## 2020-10-26 ENCOUNTER — Ambulatory Visit (INDEPENDENT_AMBULATORY_CARE_PROVIDER_SITE_OTHER): Payer: Medicare Other | Admitting: General Practice

## 2020-10-26 ENCOUNTER — Telehealth: Payer: Medicare Other | Admitting: General Practice

## 2020-10-26 DIAGNOSIS — I129 Hypertensive chronic kidney disease with stage 1 through stage 4 chronic kidney disease, or unspecified chronic kidney disease: Secondary | ICD-10-CM

## 2020-10-26 DIAGNOSIS — F419 Anxiety disorder, unspecified: Secondary | ICD-10-CM

## 2020-10-26 DIAGNOSIS — N182 Chronic kidney disease, stage 2 (mild): Secondary | ICD-10-CM

## 2020-10-26 DIAGNOSIS — J449 Chronic obstructive pulmonary disease, unspecified: Secondary | ICD-10-CM

## 2020-10-26 DIAGNOSIS — F3342 Major depressive disorder, recurrent, in full remission: Secondary | ICD-10-CM

## 2020-10-26 NOTE — Patient Instructions (Signed)
Visit Information  PATIENT GOALS:  Goals Addressed             This Visit's Progress    RNCM: Managing Depression- symptoms       Timeframe:  Long-Range Goal Priority:  High Start Date:     10-26-2020                        Expected End Date:         10-26-2021              Follow Up Date 12/28/2020     Self administers medications as prescribed Attends all scheduled provider appointments Continue utilizing healthy coping skills Socialize and find friends she can talk to on the phone   Why is this important?   Beating depression may take some time.  If you don't feel better right away, don't give up on your treatment plan.    Notes:          The patient verbalized understanding of instructions, educational materials, and care plan provided today and declined offer to receive copy of patient instructions, educational materials, and care plan.   Telephone follow up appointment with care management team member scheduled for:12-28-2020 at 0900 am  [place], MSN, [place] Mobile: [phone]

## 2020-10-26 NOTE — Chronic Care Management (AMB) (Signed)
Chronic Care Management   CCM RN Visit Note  10/26/2020 Name: Candace Tapia MRN: [id number] DOB: 08-12-1935  Subjective: Candace Tapia is a 85 y.o. year old female who is a primary care patient of Olin Hauser, DO. The care management team was consulted for assistance with disease management and care coordination needs.    Engaged with patient by telephone for follow up visit in response to provider referral for case management and/or care coordination services.   Consent to Services:  The patient was given information about Chronic Care Management services, agreed to services, and gave verbal consent prior to initiation of services.  Please see initial visit note for detailed documentation.   Patient agreed to services and verbal consent obtained.   Assessment: Review of patient past medical history, allergies, medications, health status, including review of consultants reports, laboratory and other test data, was performed as part of comprehensive evaluation and provision of chronic care management services.   SDOH (Social Determinants of Health) assessments and interventions performed:    CCM Care Plan  Allergies  Allergen Reactions   Tramadol Hcl Itching    Per pt it caused me to itch all over   Statins     Not sure what exact med was; just made her feel bad (can't elaborate)   Penicillin G Rash   Penicillins Rash    Has patient had a PCN reaction causing immediate rash, facial/tongue/throat swelling, SOB or lightheadedness with hypotension: No Has patient had a PCN reaction causing severe rash involving mucus membranes or skin necrosis: No Has patient had a PCN reaction that required hospitalization: No Has patient had a PCN reaction occurring within the last 10 years: No If all of the above answers are "NO", then may proceed with Cephalosporin use.   Sulfa Antibiotics Other (See Comments) and Rash    Outpatient Encounter Medications as of 10/26/2020   Medication Sig Note   acetaminophen (TYLENOL) 650 MG CR tablet Take 650 mg by mouth every 8 (eight) hours as needed for pain.    albuterol (VENTOLIN HFA) 108 (90 Base) MCG/ACT inhaler Inhale 1 puff into the lungs every 6 (six) hours as needed.     amLODipine (NORVASC) 5 MG tablet Take 1 tablet (5 mg total) by mouth daily.    aspirin 81 MG chewable tablet Chew 81 mg by mouth daily as needed.  (Patient not taking: No sig reported) 01/01/2019: Takes every once in a while   chlorthalidone (HYGROTON) 25 MG tablet Take 1 tablet (25 mg total) by mouth daily.    Cholecalciferol 25 MCG (1000 UT) capsule Vitamin D3 25 mcg (1,000 unit) capsule  1 po qd    fluticasone (FLONASE) 50 MCG/ACT nasal spray Place 1 spray into both nostrils as needed.     Fluticasone-Umeclidin-Vilant (TRELEGY ELLIPTA) 100-62.5-25 MCG/INH AEPB Inhale 1 puff into the lungs daily.    gabapentin (NEURONTIN) 400 MG capsule TAKE 1 CAPSULE(400 MG) BY MOUTH TWICE DAILY    losartan (COZAAR) 100 MG tablet Take 1 tablet (100 mg total) by mouth daily.    meclizine (ANTIVERT) 25 MG tablet Take 0.5 tablets (12.5 mg total) by mouth every 8 (eight) hours as needed for dizziness.    Multiple Vitamins-Minerals (MULTIVITAMIN WOMEN 50+) TABS Take 1 tablet by mouth daily.     NEXIUM 20 MG capsule Take 1 capsule (20 mg total) by mouth daily.    potassium chloride (MICRO-K) 10 MEQ CR capsule Take 1 capsule (10 mEq total) by mouth daily. May  open capsule and sprinkle over soft food or swallow whole    Spacer/Aero-Holding Chambers (AEROCHAMBER PLUS) inhaler Use as instructed    [DISCONTINUED] fexofenadine (ALLEGRA ALLERGY) 180 MG tablet Take 180 mg by mouth as needed. Reported on 09/29/2015    No facility-administered encounter medications on file as of 10/26/2020.    Patient Active Problem List   Diagnosis Date Noted   Obesity (BMI 35.0-39.9 without comorbidity) 01/09/2019   Peripheral polyneuropathy 12/27/2017   Vestibular disorder 03/20/2017   CKD  (chronic kidney disease), stage II 12/09/2016   Chronic venous insufficiency 11/25/2016   Bilateral lower extremity edema 10/27/2016   Pre-diabetes 08/12/2016   Carotid bruit 02/09/2016   History of left breast cancer 02/09/2016   Long term current use of opiate analgesic 02/08/2016   Long term prescription opiate use 02/08/2016   Drug level above therapeutic range 02/08/2016   Chronic low back pain (Location of Tertiary source of pain) (Bilateral) (R>L) 02/08/2016   Chronic knee pain (Location of Secondary source of pain) (Bilateral) (L>R) 02/08/2016   History bilateral shoulder replacement 02/08/2016   Chronic hip pain (Location of Primary Source of Pain) (Bilateral) (R>L) 01/07/2016   Complete rupture of rotator cuff 01/07/2016   Hyperlipidemia 01/07/2016   Osteopenia 01/07/2016   Centrilobular emphysema ([place]) 07/21/2015   DDD (degenerative disc disease), lumbar 03/05/2015   Lumbar facet syndrome (Bilateral) (R>L) 03/05/2015   Sacroiliac joint dysfunction 03/05/2015   Osteoarthritis of hip (Bilateral) (R>L) 03/05/2015   DJD of shoulder 03/05/2015   Abnormal finding on mammography 03/03/2015   Back pain with radiation 03/03/2015   Major depression in full remission ([place]) 03/03/2015   Dry skin 03/03/2015   Hypokalemia 03/03/2015   Drug-induced myopathy 03/03/2015   Gastro-esophageal reflux disease without esophagitis 03/03/2015   Allergic rhinitis, seasonal 03/03/2015   Osteoarthritis of multiple joints 03/03/2015   Benign hypertension with CKD (chronic kidney disease), stage II 11/28/2014   Chronic pain 11/28/2014   Vitamin D deficiency 11/28/2014   Airway hyperreactivity 09/18/2013   Microscopic hematuria 12/20/2011    Conditions to be addressed/monitored:HTN, COPD, Anxiety, and Depression  Care Plan : RNCM: COPD (Adult)  Updates made by Vanita Ingles since 10/26/2020 12:00 AM     Problem: RNCM: Psychological Adjustment to Diagnosis (COPD)   Priority: Medium      Long-Range Goal: RNCM: COPD management   Start Date: 06/04/2020  Expected End Date: 08/22/2021  This Visit's Progress: On track  Priority: Medium  Note:   Current Barriers:  Knowledge deficits related to basic understanding of COPD disease process Knowledge deficits related to basic COPD self care/management Knowledge deficit related to basic understanding of how to use inhalers and how inhaled medications work Knowledge deficit related to importance of energy conservation Limited Social Support Lacks social connections Does not contact provider office for questions/concerns  Case Manager Clinical Goal(s): Over the next 120 days patient will report using inhalers as prescribed including rinsing mouth after use Over the next 120 days patient will report utilizing pursed lip breathing for shortness of breath Over the next 120 days, patient will be able to verbalize understanding of COPD action plan and when to seek appropriate levels of medical care Over the next 120 days, patient will engage in lite exercise as tolerated to build/regain stamina and strength and reduce shortness of breath through activity tolerance Over the next 120 days, patient will verbalize basic understanding of COPD disease process and self care activities Over the next 120 days, patient will not be hospitalized  for COPD exacerbation as evidenced  Interventions:  Collaboration with Olin Hauser, DO regarding development and update of comprehensive plan of care as evidenced by provider attestation and co-signature Inter-disciplinary care team collaboration (see longitudinal plan of care) Provided patient with basic written and verbal COPD education on self care/management/and exacerbation prevention. 10-26-2020: The patient denies any issues today with her breathing. States that she gets easily short of breath with increased activity. The patient states that she is staying in and not getting out at all. She  does not like not being able to socialize but states that she knows she needs to stay in and not risk being sick.  Provided patient with COPD action plan and reinforced importance of daily self assessment Provided written and verbal instructions on pursed lip breathing and utilized returned demonstration as teach back Provided instruction about proper use of medications used for management of COPD including inhalers Advised patient to self assesses COPD action plan zone and make appointment with provider if in the yellow zone for 48 hours without improvement. Provided patient with education about the role of exercise in the management of COPD Advised patient to engage in light exercise as tolerated 3-5 days a week Provided education about and advised patient to utilize infection prevention strategies to reduce risk of respiratory infection  Review of upcoming appointments. 11-8-2022t.  Patient Goals/Self-Care Activities:  - decision-making supported - depression screen reviewed - emotional support provided - family involvement promoted - problem-solving facilitated - relaxation techniques promoted - verbalization of feelings encouraged Follow Up Plan: Telephone follow up appointment with care management team member scheduled for: 12-28-2020 at 0900 am    Task: RNCM: Support Psychosocial Response to Chronic Obstructive Pulmonary Disease Completed 10/26/2020  Outcome: Positive  Note:   Care Management Activities:    - decision-making supported - depression screen reviewed - emotional support provided - family involvement promoted - problem-solving facilitated - relaxation techniques promoted - verbalization of feelings encouraged      Care Plan : RNCM: Hypertension (Adult)  Updates made by Vanita Ingles since 10/26/2020 12:00 AM     Problem: RNCM: HTN   Priority: Medium     Long-Range Goal: RNCM: Hypertension Monitored   Start Date: 06/04/2020  Expected End Date: 08/22/2021   This Visit's Progress: On track  Priority: Medium  Note:   Objective:  Last practice recorded BP readings:  BP Readings from Last 3 Encounters:  09/21/20 (!) 113/42  03/02/20 (!) 127/52  10/16/19 (!) 122/56   Most recent eGFR/CrCl: No results found for: EGFR  No components found for: CRCL Current Barriers:  Knowledge Deficits related to basic understanding of hypertension pathophysiology and self care management Knowledge Deficits related to understanding of medications prescribed for management of hypertension Transportation barriers Limited Social Support Unable to independently manage HTN Lacks social connections Does not contact provider office for questions/concerns Case Manager Clinical Goal(s):  patient will verbalize understanding of plan for hypertension management patient will attend all scheduled medical appointments: 02-23-2021 patient will demonstrate improved adherence to prescribed treatment plan for hypertension as evidenced by taking all medications as prescribed, monitoring and recording blood pressure as directed, adhering to low sodium/DASH diet patient will demonstrate improved health management independence as evidenced by checking blood pressure as directed and notifying PCP if SBP>160 or DBP > 90, taking all medications as prescribe, and adhering to a low sodium diet as discussed. patient will verbalize basic understanding of hypertension disease process and self health management plan as evidenced by  compliance with medications, compliance with heart healthy diet, and working with the CCM team to optimize health and well being.  Interventions:  Collaboration with Olin Hauser, DO regarding development and update of comprehensive plan of care as evidenced by provider attestation and co-signature Inter-disciplinary care team collaboration (see longitudinal plan of care) UNABLE to independently:manage HTN Evaluation of current treatment plan related to  hypertension self management and patient's adherence to plan as established by provider. Provided education to patient re: stroke prevention, s/s of heart attack and stroke, DASH diet, complications of uncontrolled blood pressure Reviewed medications with patient and discussed importance of compliance Discussed plans with patient for ongoing care management follow up and provided patient with direct contact information for care management team Advised patient, providing education and rationale, to monitor blood pressure daily and record, calling PCP for findings outside established parameters.  Reviewed scheduled/upcoming provider appointments including: 02-23-2021 Self-Care Activities: - Self administers medications as prescribed Attends all scheduled provider appointments Calls provider office for new concerns, questions, or BP outside discussed parameters Checks BP and records as discussed Follows a low sodium diet/DASH diet Patient Goals: - check blood pressure weekly - choose a place to take my blood pressure (home, clinic or office, retail store) - write blood pressure results in a log or diary - agree on reward when goals are met - agree to work together to make changes - ask questions to understand - have a family meeting to talk about healthy habits - learn about high blood pressure  Follow Up Plan: Telephone follow up appointment with care management team member scheduled for: 12-28-2020 at 0900 am    Task: RNCM: Identify and Monitor Blood Pressure Elevation Completed 10/26/2020  Outcome: Positive  Note:   Care Management Activities:    - blood pressure trends reviewed - depression screen reviewed - home or ambulatory blood pressure monitoring encouraged      Care Plan : RNCM: Depression (Adult) and Anxiety  Updates made by Vanita Ingles since 10/26/2020 12:00 AM     Problem: RNCM: Symptoms (Depression) and Anxiety   Priority: High  Onset Date: 06/04/2020      Long-Range Goal: Symptoms Monitored and Managed   Start Date: 06/04/2020  Expected End Date: 10/21/2021  This Visit's Progress: On track  Priority: High  Note:   Current Barriers:  Ineffective Self Health Maintenance in a patient with Anxiety and Depression Unable to independently manage anxiety and depression  Lacks social connections Does not contact provider office for questions/concerns Clinical Goal(s):  Collaboration with [place], Devonne Doughty, DO regarding development and update of comprehensive plan of care as evidenced by provider attestation and co-signature Inter-disciplinary care team collaboration (see longitudinal plan of care) patient will work with care management team to address care coordination and chronic disease management needs related to Disease Management Educational Needs Care Coordination Psychosocial Support Mental Health Counseling Level of Care Concerns   Interventions:  Evaluation of current treatment plan related to Anxiety and Depression, Limited social support and Mental Health Concerns  self-management and patient's adherence to plan as established by provider. Collaboration with Olin Hauser, DO regarding development and update of comprehensive plan of care as evidenced by provider attestation       and co-signature Inter-disciplinary care team collaboration (see longitudinal plan of care) Discussed plans with patient for ongoing care management follow up and provided patient with direct contact information for care management team Self Care Activities:  Patient verbalizes understanding of plan to effective management  of anxiety and depression  Self administers medications as prescribed Attends all scheduled provider appointments Calls pharmacy for medication refills Attends church or other social activities Performs ADL's independently Performs IADL's independently Calls provider office for new concerns or questions Patient  Goals: - activity or exercise based on tolerance encouraged - depression screen reviewed - emotional support provided - healthy lifestyle promoted - medication side effects monitored and managed - pain managed - participation in mental health treatment encouraged - quality of sleep assessed - response to mental health treatment monitored - social activities and relationships encouraged - substance use assessed Follow Up Plan: Telephone follow up appointment with care management team member scheduled for: 12-28-2020 at 0900 am    Task: RNCM: iate Barriers to Depression Treatment Completed 10/26/2020  Outcome: Positive  Note:   Care Management Activities:    - activity or exercise based on tolerance encouraged - depression screen reviewed - emotional support provided - healthy lifestyle promoted - medication side effects monitored and managed - pain managed - participation in mental health treatment encouraged - quality of sleep assessed - response to mental health treatment monitored - social activities and relationships encouraged - substance use assessed    Notes:      Plan:Telephone follow up appointment with care management team member scheduled for:  12-28-2020 at 0900 am  [place], MSN, [place] Mobile: [phone]

## 2020-10-28 ENCOUNTER — Ambulatory Visit: Payer: Medicare Other | Admitting: Pharmacist

## 2020-10-28 DIAGNOSIS — N182 Chronic kidney disease, stage 2 (mild): Secondary | ICD-10-CM

## 2020-10-28 DIAGNOSIS — F3342 Major depressive disorder, recurrent, in full remission: Secondary | ICD-10-CM | POA: Diagnosis not present

## 2020-10-28 DIAGNOSIS — J449 Chronic obstructive pulmonary disease, unspecified: Secondary | ICD-10-CM

## 2020-10-28 DIAGNOSIS — I129 Hypertensive chronic kidney disease with stage 1 through stage 4 chronic kidney disease, or unspecified chronic kidney disease: Secondary | ICD-10-CM | POA: Diagnosis not present

## 2020-10-28 NOTE — Chronic Care Management (AMB) (Signed)
Chronic Care Management Pharmacy Note  10/28/2020 Name:  Candace Tapia MRN:  [id number] DOB:  [birth year]   Subjective: Candace Tapia is an 85 y.o. year old female who is a primary patient of Olin Hauser, DO.  The CCM team was consulted for assistance with disease management and care coordination needs.    Engaged with patient by telephone for follow up visit in response to provider referral for pharmacy case management and/or care coordination services.   Consent to Services:  The patient was given information about Chronic Care Management services, agreed to services, and gave verbal consent prior to initiation of services.  Please see initial visit note for detailed documentation.   Patient Care Team: Olin Hauser, DO as PCP - General (Family Medicine) Erby Pian, MD as Referring Physician (Specialist) Lequita Asal, MD as Referring Physician (Hematology and Oncology) Trejan Buda, Virl Diamond, RPH-CPP as Pharmacist Hall Busing Nobie Putnam, RN as Case Manager (General Practice) Rebekah Chesterfield, LCSW as Social Worker (Licensed Clinical Social Worker)  Recent office visits: Office Visit with PCP on 6/6 for follow up of prediabetes, HTN and COPD  Recent consult visits: Office Visit with [facility]ology on 5/31 for follow up  Hospital visits: None in previous 6 months  Objective:  Lab Results  Component Value Date   CREATININE 0.80 02/21/2020   CREATININE 0.78 01/02/2019   CREATININE 0.90 10/02/2018    Lab Results  Component Value Date   HGBA1C 5.7 (A) 09/21/2020   Last diabetic Eye exam: No results found for: HMDIABEYEEXA  Last diabetic Foot exam: No results found for: HMDIABFOOTEX    Social History   Tobacco Use  Smoking Status Former   Packs/day: 2.00   Years: 40.00   Pack years: 80.00   Types: Cigarettes   Quit date: 04/19/1993   Years since quitting: 27.5  Smokeless Tobacco Never   BP Readings from Last 3  Encounters:  09/21/20 (!) 113/42  03/02/20 (!) 127/52  10/16/19 (!) 122/56   Pulse Readings from Last 3 Encounters:  09/21/20 92  03/02/20 86  10/16/19 94   Wt Readings from Last 3 Encounters:  09/21/20 222 lb (100.7 kg)  07/21/20 210 lb (95.3 kg)  03/02/20 220 lb (99.8 kg)    Assessment: Review of patient past medical history, allergies, medications, health status, including review of consultants reports, laboratory and other test data, was performed as part of comprehensive evaluation and provision of chronic care management services.   SDOH:  (Social Determinants of Health) assessments and interventions performed: none   CCM Care Plan  Allergies  Allergen Reactions   Tramadol Hcl Itching    Per pt it caused me to itch all over   Statins     Not sure what exact med was; just made her feel bad (can't elaborate)   Penicillin G Rash   Penicillins Rash    Has patient had a PCN reaction causing immediate rash, facial/tongue/throat swelling, SOB or lightheadedness with hypotension: No Has patient had a PCN reaction causing severe rash involving mucus membranes or skin necrosis: No Has patient had a PCN reaction that required hospitalization: No Has patient had a PCN reaction occurring within the last 10 years: No If all of the above answers are "NO", then may proceed with Cephalosporin use.   Sulfa Antibiotics Other (See Comments) and Rash    Medications Reviewed Today     Reviewed by Vanita Ingles on 10/26/20 at 1633  Med List  Status: <None>   Medication Order Taking? Sig Documenting Provider Last Dose Status Informant  acetaminophen (TYLENOL) 650 MG CR tablet [id number] No Take 650 mg by mouth every 8 (eight) hours as needed for pain. [provider] Taking Active Self  albuterol (VENTOLIN HFA) 108 (90 Base) MCG/ACT inhaler [id number] No Inhale 1 puff into the lungs every 6 (six) hours as needed.  [provider] Taking Active Self           Med Note  Fabio Bering   Sat May 13, 2017 10:07 AM)    amLODipine (NORVASC) 5 MG tablet [id number]  Take 1 tablet (5 mg total) by mouth daily. Olin Hauser, DO  Active   aspirin 81 MG chewable tablet [id number] No Chew 81 mg by mouth daily as needed.   Patient not taking: No sig reported   [provider] Not Taking Active            Med Note (HILL, TIFFANY A   Tue Jan 01, 2019  3:30 PM) Takes every once in a while  chlorthalidone (HYGROTON) 25 MG tablet [id number]  Take 1 tablet (25 mg total) by mouth daily. Olin Hauser, DO  Active   Cholecalciferol 25 MCG (1000 UT) capsule [id number] No Vitamin D3 25 mcg (1,000 unit) capsule  1 po qd [provider] Taking Active   Discontinued 08/18/19 1134          Med Note Fabio Bering   Sat May 13, 2017 10:03 AM)    fluticasone (FLONASE) 50 MCG/ACT nasal spray [id number] No Place 1 spray into both nostrils as needed.  [provider] Taking Active Self  Fluticasone-Umeclidin-Vilant (TRELEGY ELLIPTA) 100-62.5-25 MCG/INH AEPB [id number] No Inhale 1 puff into the lungs daily. [provider] Taking Active   gabapentin (NEURONTIN) 400 MG capsule [id number] No TAKE 1 CAPSULE(400 MG) BY MOUTH TWICE DAILY Karamalegos, Devonne Doughty, DO Taking Active   losartan (COZAAR) 100 MG tablet [id number]  Take 1 tablet (100 mg total) by mouth daily. Karamalegos, Devonne Doughty, DO  Active   meclizine (ANTIVERT) 25 MG tablet [id number] No Take 0.5 tablets (12.5 mg total) by mouth every 8 (eight) hours as needed for dizziness. Olin Hauser, DO Taking Active   Multiple Vitamins-Minerals (MULTIVITAMIN WOMEN 50+) TABS [id number] No Take 1 tablet by mouth daily.  [provider] Taking Active Self  NEXIUM 20 MG capsule [id number] No Take 1 capsule (20 mg total) by mouth daily. Arlis Porta., MD Taking Active Self  potassium chloride (MICRO-K) 10 MEQ CR capsule [id number]  Take 1 capsule (10 mEq total) by  mouth daily. May open capsule and sprinkle over soft food or swallow whole Parks Ranger, Devonne Doughty, DO  Active   Spacer/Aero-Holding Chambers (AEROCHAMBER PLUS) inhaler [id number] No Use as instructed Melynda Ripple, MD Taking Active   Med List Note Ignatius Specking, RN 02/08/16 1505): UDS 02/08/16 meds due 03/09/16            Patient Active Problem List   Diagnosis Date Noted   Obesity (BMI 35.0-39.9 without comorbidity) 01/09/2019   Peripheral polyneuropathy 12/27/2017   Vestibular disorder 03/20/2017   CKD (chronic kidney disease), stage II 12/09/2016   Chronic venous insufficiency 11/25/2016   Bilateral lower extremity edema 10/27/2016   Pre-diabetes 08/12/2016   Carotid bruit 02/09/2016   History of left breast cancer 02/09/2016   Long term current use of opiate analgesic 02/08/2016   Long term prescription opiate use 02/08/2016  Drug level above therapeutic range 02/08/2016   Chronic low back pain (Location of Tertiary source of pain) (Bilateral) (R>L) 02/08/2016   Chronic knee pain (Location of Secondary source of pain) (Bilateral) (L>R) 02/08/2016   History bilateral shoulder replacement 02/08/2016   Chronic hip pain (Location of Primary Source of Pain) (Bilateral) (R>L) 01/07/2016   Complete rupture of rotator cuff 01/07/2016   Hyperlipidemia 01/07/2016   Osteopenia 01/07/2016   Centrilobular emphysema ([place]) 07/21/2015   DDD (degenerative disc disease), lumbar 03/05/2015   Lumbar facet syndrome (Bilateral) (R>L) 03/05/2015   Sacroiliac joint dysfunction 03/05/2015   Osteoarthritis of hip (Bilateral) (R>L) 03/05/2015   DJD of shoulder 03/05/2015   Abnormal finding on mammography 03/03/2015   Back pain with radiation 03/03/2015   Major depression in full remission ([place]) 03/03/2015   Dry skin 03/03/2015   Hypokalemia 03/03/2015   Drug-induced myopathy 03/03/2015   Gastro-esophageal reflux disease without esophagitis 03/03/2015   Allergic rhinitis, seasonal  03/03/2015   Osteoarthritis of multiple joints 03/03/2015   Benign hypertension with CKD (chronic kidney disease), stage II 11/28/2014   Chronic pain 11/28/2014   Vitamin D deficiency 11/28/2014   Airway hyperreactivity 09/18/2013   Microscopic hematuria 12/20/2011    Immunization History  Administered Date(s) Administered   Fluad Quad(high Dose 65+) 01/01/2019, 03/02/2020   Influenza, High Dose Seasonal PF 02/24/2015, 02/09/2016, 12/12/2016, 01/23/2018   Influenza-Unspecified 01/16/2014   Pneumococcal Conjugate-13 08/29/2014   Pneumococcal Polysaccharide-23 04/19/2007   Tdap 01/17/2013   Zoster, Live 08/29/2014    Conditions to be addressed/monitored: HTN and COPD  Care Plan : PharmD - Med Mgmt  Updates made by Vella Raring, RPH-CPP since 10/28/2020 12:00 AM     Problem: Disease Progression      Long-Range Goal: Disease Progression Prevented or Minimized   Start Date: 06/22/2020  Expected End Date: 09/20/2020  This Visit's Progress: On track  Recent Progress: On track  Priority: High  Note:   Current Barriers:  Knowledge deficits related to basic COPD self care/management Knowledge deficit related to basic understanding of how to use inhalers and how inhaled medications work   Public house manager limitations  Pharmacist Clinical Goal(s):  Over the next 90 days, patient will achieve adherence to monitoring guidelines and medication adherence to achieve therapeutic efficacy through collaboration with PharmD and provider.   Interventions: 1:1 collaboration with Olin Hauser, DO regarding development and update of comprehensive plan of care as evidenced by provider attestation and co-signature Inter-disciplinary care team collaboration (see longitudinal plan of care) Perform chart review Patient seen for Office Visit with PCP on 6/6 for follow up of prediabetes, HTN and COPD Office Visit with [facility] Pulmonology on 5/31 for follow up.  Provider advised patient to: Continue albuterol prn, trelegy one puff q day Weight loss recommended strongly Nocturnal oxygen at 2 liters Follow up in 18 weeks  Patient declines to review medications today  Hypertension: Current treatment: Amlodipine 5 mg daily Chlorthalidone 25 mg daily Losartan 100 mg daily Note patient has a home upper arm BP monitor Reports has been monitoring home BP and recalls latest reading SBP was 137. Reports has been keeping a log of results, but declines to review record today Encourage patient to continue to check home BP, keeping log and bring log with her to medical appointments  Chronic Obstructive Pulmonary Disease: Patient followed by [facility]ology for management of COPD Current treatment: Trelegy - 1 puff daily Albuterol as needed Nocturnal oxygen Encourage patient to use/counsel on  rational for using maintenance inhaler (Trelegy) daily as directed and rescue (albuerol) inhaler as needed Patient confirms rinsing out mouth after use of Trelegy  Medication Adherence: Reported using weekly pillbox as directed  Medication Assistance: Counseled patient on requirements of Trelegy patient assistance program including out of pocket expenditure requirement Patient reports meets income requirement for program and will follow up with pharmacy to see if/when will meet out of pocket requirement Will collaborate with [place] to request she send Trelegy patient assistance program application to patient Patient reports will ask her daughter-in-law, Baker Janus, to help her with completing application and obtaining supporting documents to return to [place]  Patient Goals/Self-Care Activities Over the next 90 days, patient will:  Attends all scheduled provider appointments Calls pharmacy for medication refills Calls provider office for new concerns or questions  Follow Up Plan: Telephone follow up appointment with care management team  member scheduled for: 9/16 at 10 am     Patient's preferred pharmacy is:  [facility] [address], [place] [address] Phone: [phone] Fax: [place], [place] [address] [place] [address] Phone: [phone] Fax: [place], [place] [address] Phone: [phone] Fax: [phone]  Uses pill box? Yes   Follow Up:  Patient agrees to Care Plan and Follow-up.  Harlow Asa, PharmD, [place], CPP Clinical Pharmacist [facility] [phone]

## 2020-10-28 NOTE — Patient Instructions (Signed)
Visit Information  PATIENT GOALS:  Goals Addressed             This Visit's Progress    Pharmacy Goals       Please check your home blood pressure, keep a log of the results and bring this with you to your medical appointments.   Feel free to call me with any questions or concerns. I look forward to our next call!  Harlow Asa, PharmD, [place] [phone]         The patient verbalized understanding of instructions, educational materials, and care plan provided today and declined offer to receive copy of patient instructions, educational materials, and care plan.   Telephone follow up appointment with care management team member scheduled for: 9/16 at 10 am

## 2020-11-03 ENCOUNTER — Telehealth: Payer: Self-pay | Admitting: Pharmacy Technician

## 2020-11-03 DIAGNOSIS — Z596 Low income: Secondary | ICD-10-CM

## 2020-11-03 NOTE — Progress Notes (Signed)
[facility])                                            [place] Team    11/03/2020  FREYJA GOVEA 04-27-1935 [id number]                                       Medication Assistance Referral  Referral From: [facility] Embedded RPh Dorthula Perfect   Medication/Company: Trelegy / [place] Patient application portion:  Mailed Provider application portion: Faxed  to Dr. Wallene Huh Provider address/fax verified via: Office website     Ludivina Guymon P. Brigetta Beckstrom, [place]  [phone]

## 2020-11-14 ENCOUNTER — Other Ambulatory Visit: Payer: Self-pay | Admitting: Family Medicine

## 2020-11-14 DIAGNOSIS — G629 Polyneuropathy, unspecified: Secondary | ICD-10-CM

## 2020-11-18 ENCOUNTER — Ambulatory Visit (INDEPENDENT_AMBULATORY_CARE_PROVIDER_SITE_OTHER): Payer: Medicare Other | Admitting: Licensed Clinical Social Worker

## 2020-11-18 DIAGNOSIS — F3342 Major depressive disorder, recurrent, in full remission: Secondary | ICD-10-CM | POA: Diagnosis not present

## 2020-11-18 DIAGNOSIS — J4489 Other specified chronic obstructive pulmonary disease: Secondary | ICD-10-CM

## 2020-11-18 DIAGNOSIS — G894 Chronic pain syndrome: Secondary | ICD-10-CM

## 2020-11-18 DIAGNOSIS — I10 Essential (primary) hypertension: Secondary | ICD-10-CM

## 2020-11-18 DIAGNOSIS — J449 Chronic obstructive pulmonary disease, unspecified: Secondary | ICD-10-CM

## 2020-11-19 NOTE — Patient Instructions (Signed)
Visit Information   Goals Addressed               This Visit's Progress     Patient Stated     "I really extra support at times because I'm alone" (pt-stated)   On track     Patient Self Care Activities:  Self administers medications as prescribed Attend all scheduled provider appointments Contact clinic with any questions or concerns Continue utilizing healthy coping skills      Other     SW-Track and Manage My Symptoms-Depression   On track     Timeframe:  Long-Range Goal Priority:  Medium Start Date 3/23/222                        Expected End Date: 02/15/21            Follow Up Date- 02/03/21   Patient Self Care Activities:  Self administers medications as prescribed Attend all scheduled provider appointments Contact clinic with any questions or concerns Continue utilizing healthy coping skills        Patient verbalizes understanding of instructions provided today.   Telephone follow up appointment with care management team member scheduled for:02/03/21  Christa See, MSW, [email or web address] Phone [phone] 3:39 PM

## 2020-11-19 NOTE — Chronic Care Management (AMB) (Signed)
Chronic Care Management    Clinical Social Work Note  11/19/2020 Name: Candace Tapia MRN: [id number] DOB: [birth year]  Candace Tapia is a 85 y.o. year old female who is a primary care patient of Olin Hauser, DO. The CCM team was consulted to assist the patient with chronic disease management and/or care coordination needs related to: Mental Health Counseling and Resources.   Engaged with patient by telephone for follow up visit in response to provider referral for social work chronic care management and care coordination services.   Consent to Services:  The patient was given information about Chronic Care Management services, agreed to services, and gave verbal consent prior to initiation of services.  Please see initial visit note for detailed documentation.   Patient agreed to services and consent obtained.   Assessment: Patient is engaged in conversation, continues to maintain positive progress with care plan goals. She continues to utilize healthy coping skills and receives strong support from family. See Care Plan below for interventions and patient self-care actives.  Recommendation: Patient may benefit from, and is in agreement to work with LCSW to address care coordination needs and will continue to work with the clinical team to address health care and disease management related needs.   Follow up Plan: Patient would like continued follow-up from CCM LCSW .  Follow up scheduled in 02/03/21. Patient will call office if needed prior to next encounter.     SDOH (Social Determinants of Health) assessments and interventions performed:    Advanced Directives Status: Not addressed in this encounter.  CCM Care Plan  Allergies  Allergen Reactions   Tramadol Hcl Itching    Per pt it caused me to itch all over   Statins     Not sure what exact med was; just made her feel bad (can't elaborate)   Penicillin G Rash   Penicillins Rash    Has patient had a PCN reaction  causing immediate rash, facial/tongue/throat swelling, SOB or lightheadedness with hypotension: No Has patient had a PCN reaction causing severe rash involving mucus membranes or skin necrosis: No Has patient had a PCN reaction that required hospitalization: No Has patient had a PCN reaction occurring within the last 10 years: No If all of the above answers are "NO", then may proceed with Cephalosporin use.   Sulfa Antibiotics Other (See Comments) and Rash    Outpatient Encounter Medications as of 11/18/2020  Medication Sig Note   acetaminophen (TYLENOL) 650 MG CR tablet Take 650 mg by mouth every 8 (eight) hours as needed for pain.    albuterol (VENTOLIN HFA) 108 (90 Base) MCG/ACT inhaler Inhale 1 puff into the lungs every 6 (six) hours as needed.     amLODipine (NORVASC) 5 MG tablet Take 1 tablet (5 mg total) by mouth daily.    aspirin 81 MG chewable tablet Chew 81 mg by mouth daily as needed.  (Patient not taking: No sig reported) 01/01/2019: Takes every once in a while   chlorthalidone (HYGROTON) 25 MG tablet Take 1 tablet (25 mg total) by mouth daily.    Cholecalciferol 25 MCG (1000 UT) capsule Vitamin D3 25 mcg (1,000 unit) capsule  1 po qd    fluticasone (FLONASE) 50 MCG/ACT nasal spray Place 1 spray into both nostrils as needed.     Fluticasone-Umeclidin-Vilant (TRELEGY ELLIPTA) 100-62.5-25 MCG/INH AEPB Inhale 1 puff into the lungs daily.    gabapentin (NEURONTIN) 400 MG capsule TAKE 1 CAPSULE(400 MG) BY MOUTH TWICE DAILY  losartan (COZAAR) 100 MG tablet Take 1 tablet (100 mg total) by mouth daily.    meclizine (ANTIVERT) 25 MG tablet Take 0.5 tablets (12.5 mg total) by mouth every 8 (eight) hours as needed for dizziness.    Multiple Vitamins-Minerals (MULTIVITAMIN WOMEN 50+) TABS Take 1 tablet by mouth daily.     NEXIUM 20 MG capsule Take 1 capsule (20 mg total) by mouth daily.    potassium chloride (MICRO-K) 10 MEQ CR capsule Take 1 capsule (10 mEq total) by mouth daily. May open  capsule and sprinkle over soft food or swallow whole    Spacer/Aero-Holding Chambers (AEROCHAMBER PLUS) inhaler Use as instructed    No facility-administered encounter medications on file as of 11/18/2020.    Patient Active Problem List   Diagnosis Date Noted   Obesity (BMI 35.0-39.9 without comorbidity) 01/09/2019   Peripheral polyneuropathy 12/27/2017   Vestibular disorder 03/20/2017   CKD (chronic kidney disease), stage II 12/09/2016   Chronic venous insufficiency 11/25/2016   Bilateral lower extremity edema 10/27/2016   Pre-diabetes 08/12/2016   Carotid bruit 02/09/2016   History of left breast cancer 02/09/2016   Long term current use of opiate analgesic 02/08/2016   Long term prescription opiate use 02/08/2016   Drug level above therapeutic range 02/08/2016   Chronic low back pain (Location of Tertiary source of pain) (Bilateral) (R>L) 02/08/2016   Chronic knee pain (Location of Secondary source of pain) (Bilateral) (L>R) 02/08/2016   History bilateral shoulder replacement 02/08/2016   Chronic hip pain (Location of Primary Source of Pain) (Bilateral) (R>L) 01/07/2016   Complete rupture of rotator cuff 01/07/2016   Hyperlipidemia 01/07/2016   Osteopenia 01/07/2016   Centrilobular emphysema ([place]) 07/21/2015   DDD (degenerative disc disease), lumbar 03/05/2015   Lumbar facet syndrome (Bilateral) (R>L) 03/05/2015   Sacroiliac joint dysfunction 03/05/2015   Osteoarthritis of hip (Bilateral) (R>L) 03/05/2015   DJD of shoulder 03/05/2015   Abnormal finding on mammography 03/03/2015   Back pain with radiation 03/03/2015   Major depression in full remission ([place]) 03/03/2015   Dry skin 03/03/2015   Hypokalemia 03/03/2015   Drug-induced myopathy 03/03/2015   Gastro-esophageal reflux disease without esophagitis 03/03/2015   Allergic rhinitis, seasonal 03/03/2015   Osteoarthritis of multiple joints 03/03/2015   Benign hypertension with CKD (chronic kidney disease), stage II  11/28/2014   Chronic pain 11/28/2014   Vitamin D deficiency 11/28/2014   Airway hyperreactivity 09/18/2013   Microscopic hematuria 12/20/2011    Conditions to be addressed/monitored: Depression; Mental Health Concerns   Care Plan : General Social Work (Adult)  Updates made by Rebekah Chesterfield, LCSW since 11/19/2020 12:00 AM     Problem: Quality of Life (General Plan of Care)      Long-Range Goal: Quality of Life Maintained   Start Date: 07/08/2020  This Visit's Progress: On track  Recent Progress: On track  Priority: Medium  Note:   Timeframe:  Long-Range Goal Priority:  Medium Start Date 3/23/222                        Expected End Date: 02/15/21            Follow Up Date- 02/03/21   Current Barriers:  Financial constraints related to affording in home support or eventual long term care placement Limited social support Level of care concerns ADL IADL limitations Limited access to caregiver Inability to perform ADL's independently Inability to perform IADL's independently Ongoing depression  Clinical Social Work Clinical Goal(s):  Over the next 120 days, patient will work with SW to address concerns related to increasing depression and anxiety management and gaining additional support within the home   Interventions: Assessed patient's previous and current treatment, coping skills, support system and barriers to care Patient interviewed and appropriate assessments performed Patient reports that she is managing symptoms of depression well. States, "I'm doing the best i can" Patient endorses increase in fatigue due to waking up throughout the night. She also experiences pain in her hips and back from arthritis Patient is compliant with pain meds, stating "they work well" Patient utilizes a walker to promote safety. Patient denies any recent falls Patient was successful in identifying healthy coping skills to assist with management of symptoms Patient endorses long hx of  depression and anxiety symptoms triggered by grief of loved ones and chronic medical conditions. Patient reports that she has difficulty with hearing over the phone and her eyesight has worsened; however, feels depression/anxiety symptoms are managed at this time Patient enjoys listening to music to assist with management of symptoms. She is aware of the variety in audiobooks, as well Patient utilizes Med Alert to assist with promoting safety in the event of a fall Patient reports that her daughter in law continues to get her groceries for her and provides transportation for her medical appointments. She has two sons that assist with her care. She reports that her family visits her usually once per week on Sundays Solution-Focused Strategies, Active listening / Reflection utilized , Emotional Supportive Provided, Verbalization of feelings encouraged , and Suicidal Ideation/Homicidal Ideation assessed: ; Discussed plans with patient for ongoing care management follow up and provided patient with direct contact information for care management team Collaboration with PCP regarding development and update of comprehensive plan of care as evidenced by provider attestation and co-signature Inter-disciplinary care team collaboration (see longitudinal plan of care) Patient Self Care Activities:  Self administers medications as prescribed Attend all scheduled provider appointments Contact clinic with any questions or concerns Continue utilizing healthy coping skills       Christa See, MSW, [email or web address] Phone [phone] 3:37 PM

## 2020-11-20 DIAGNOSIS — J449 Chronic obstructive pulmonary disease, unspecified: Secondary | ICD-10-CM | POA: Diagnosis not present

## 2020-12-21 DIAGNOSIS — J449 Chronic obstructive pulmonary disease, unspecified: Secondary | ICD-10-CM | POA: Diagnosis not present

## 2020-12-28 ENCOUNTER — Telehealth: Payer: Medicare Other | Admitting: General Practice

## 2020-12-28 ENCOUNTER — Ambulatory Visit (INDEPENDENT_AMBULATORY_CARE_PROVIDER_SITE_OTHER): Payer: Medicare Other

## 2020-12-28 DIAGNOSIS — F419 Anxiety disorder, unspecified: Secondary | ICD-10-CM

## 2020-12-28 DIAGNOSIS — F3342 Major depressive disorder, recurrent, in full remission: Secondary | ICD-10-CM

## 2020-12-28 DIAGNOSIS — I10 Essential (primary) hypertension: Secondary | ICD-10-CM

## 2020-12-28 DIAGNOSIS — J449 Chronic obstructive pulmonary disease, unspecified: Secondary | ICD-10-CM

## 2020-12-28 NOTE — Chronic Care Management (AMB) (Signed)
Chronic Care Management   CCM RN Visit Note  12/28/2020 Name: Candace Tapia MRN: [id number] DOB: 06-23-1935  Subjective: Candace Tapia is a 85 y.o. year old female who is a primary care patient of Olin Hauser, DO. The care management team was consulted for assistance with disease management and care coordination needs.    Engaged with patient by telephone for follow up visit in response to provider referral for case management and/or care coordination services.   Consent to Services:  The patient was given information about Chronic Care Management services, agreed to services, and gave verbal consent prior to initiation of services.  Please see initial visit note for detailed documentation.   Patient agreed to services and verbal consent obtained.   Assessment: Review of patient past medical history, allergies, medications, health status, including review of consultants reports, laboratory and other test data, was performed as part of comprehensive evaluation and provision of chronic care management services.   SDOH (Social Determinants of Health) assessments and interventions performed:  SDOH Interventions    Flowsheet Row Most Recent Value  SDOH Interventions   Food Insecurity Interventions Intervention Not Indicated  Financial Strain Interventions Intervention Not Indicated  Housing Interventions Intervention Not Indicated  Intimate Partner Violence Interventions Intervention Not Indicated  Physical Activity Interventions Other (Comments)  [no structured activity, gets easily short of breath]  Stress Interventions Other (Comment)  [just lonely and depressed]  Social Connections Interventions Other (Comment)  [the patient does get lonely, she likes when her family visits, most of her family have passed and friends- she has 2 sons and a daughter in law that help with her appointments and groceries]  Transportation Interventions Intervention Not Indicated         CCM Care Plan  Allergies  Allergen Reactions   Tramadol Hcl Itching    Per pt it caused me to itch all over   Statins     Not sure what exact med was; just made her feel bad (can't elaborate)   Penicillin G Rash   Penicillins Rash    Has patient had a PCN reaction causing immediate rash, facial/tongue/throat swelling, SOB or lightheadedness with hypotension: No Has patient had a PCN reaction causing severe rash involving mucus membranes or skin necrosis: No Has patient had a PCN reaction that required hospitalization: No Has patient had a PCN reaction occurring within the last 10 years: No If all of the above answers are "NO", then may proceed with Cephalosporin use.   Sulfa Antibiotics Other (See Comments) and Rash    Outpatient Encounter Medications as of 12/28/2020  Medication Sig Note   acetaminophen (TYLENOL) 650 MG CR tablet Take 650 mg by mouth every 8 (eight) hours as needed for pain.    albuterol (VENTOLIN HFA) 108 (90 Base) MCG/ACT inhaler Inhale 1 puff into the lungs every 6 (six) hours as needed.     amLODipine (NORVASC) 5 MG tablet Take 1 tablet (5 mg total) by mouth daily.    aspirin 81 MG chewable tablet Chew 81 mg by mouth daily as needed.  (Patient not taking: No sig reported) 01/01/2019: Takes every once in a while   chlorthalidone (HYGROTON) 25 MG tablet Take 1 tablet (25 mg total) by mouth daily.    Cholecalciferol 25 MCG (1000 UT) capsule Vitamin D3 25 mcg (1,000 unit) capsule  1 po qd    fluticasone (FLONASE) 50 MCG/ACT nasal spray Place 1 spray into both nostrils as needed.     Fluticasone-Umeclidin-Vilant (TRELEGY  ELLIPTA) 100-62.5-25 MCG/INH AEPB Inhale 1 puff into the lungs daily. (Patient not taking: Reported on 12/28/2020)    gabapentin (NEURONTIN) 400 MG capsule TAKE 1 CAPSULE(400 MG) BY MOUTH TWICE DAILY    losartan (COZAAR) 100 MG tablet Take 1 tablet (100 mg total) by mouth daily.    meclizine (ANTIVERT) 25 MG tablet Take 0.5 tablets (12.5 mg total) by  mouth every 8 (eight) hours as needed for dizziness.    Multiple Vitamins-Minerals (MULTIVITAMIN WOMEN 50+) TABS Take 1 tablet by mouth daily.     NEXIUM 20 MG capsule Take 1 capsule (20 mg total) by mouth daily.    potassium chloride (MICRO-K) 10 MEQ CR capsule Take 1 capsule (10 mEq total) by mouth daily. May open capsule and sprinkle over soft food or swallow whole    Spacer/Aero-Holding Chambers (AEROCHAMBER PLUS) inhaler Use as instructed    [DISCONTINUED] fexofenadine (ALLEGRA ALLERGY) 180 MG tablet Take 180 mg by mouth as needed. Reported on 09/29/2015    No facility-administered encounter medications on file as of 12/28/2020.    Patient Active Problem List   Diagnosis Date Noted   Obesity (BMI 35.0-39.9 without comorbidity) 01/09/2019   Peripheral polyneuropathy 12/27/2017   Vestibular disorder 03/20/2017   CKD (chronic kidney disease), stage II 12/09/2016   Chronic venous insufficiency 11/25/2016   Bilateral lower extremity edema 10/27/2016   Pre-diabetes 08/12/2016   Carotid bruit 02/09/2016   History of left breast cancer 02/09/2016   Long term current use of opiate analgesic 02/08/2016   Long term prescription opiate use 02/08/2016   Drug level above therapeutic range 02/08/2016   Chronic low back pain (Location of Tertiary source of pain) (Bilateral) (R>L) 02/08/2016   Chronic knee pain (Location of Secondary source of pain) (Bilateral) (L>R) 02/08/2016   History bilateral shoulder replacement 02/08/2016   Chronic hip pain (Location of Primary Source of Pain) (Bilateral) (R>L) 01/07/2016   Complete rupture of rotator cuff 01/07/2016   Hyperlipidemia 01/07/2016   Osteopenia 01/07/2016   Centrilobular emphysema ([place]) 07/21/2015   DDD (degenerative disc disease), lumbar 03/05/2015   Lumbar facet syndrome (Bilateral) (R>L) 03/05/2015   Sacroiliac joint dysfunction 03/05/2015   Osteoarthritis of hip (Bilateral) (R>L) 03/05/2015   DJD of shoulder 03/05/2015   Abnormal  finding on mammography 03/03/2015   Back pain with radiation 03/03/2015   Major depression in full remission ([place]) 03/03/2015   Dry skin 03/03/2015   Hypokalemia 03/03/2015   Drug-induced myopathy 03/03/2015   Gastro-esophageal reflux disease without esophagitis 03/03/2015   Allergic rhinitis, seasonal 03/03/2015   Osteoarthritis of multiple joints 03/03/2015   Benign hypertension with CKD (chronic kidney disease), stage II 11/28/2014   Chronic pain 11/28/2014   Vitamin D deficiency 11/28/2014   Airway hyperreactivity 09/18/2013   Microscopic hematuria 12/20/2011    Conditions to be addressed/monitored:HTN, COPD, Anxiety, and Depression  Care Plan : RNCM: COPD (Adult)  Updates made by Vanita Ingles, RN since 12/28/2020 12:00 AM     Problem: RNCM: Psychological Adjustment to Diagnosis (COPD)   Priority: Medium     Long-Range Goal: RNCM: COPD management   Start Date: 06/04/2020  Expected End Date: 08/22/2021  This Visit's Progress: On track  Recent Progress: On track  Priority: Medium  Note:   Current Barriers:  Knowledge deficits related to basic understanding of COPD disease process Knowledge deficits related to basic COPD self care/management Knowledge deficit related to basic understanding of how to use inhalers and how inhaled medications work Knowledge deficit related to importance of  energy conservation Limited Social Support Lacks social connections Does not contact provider office for questions/concerns  Case Manager Clinical Goal(s):  patient will report using inhalers as prescribed including rinsing mouth after use  patient will report utilizing pursed lip breathing for shortness of breath  patient will be able to verbalize understanding of COPD action plan and when to seek appropriate levels of medical care  patient will engage in lite exercise as tolerated to build/regain stamina and strength and reduce shortness of breath through activity tolerance  patient will  verbalize basic understanding of COPD disease process and self care activities  patient will not be hospitalized for COPD exacerbation as evidenced  Interventions:  Collaboration with Olin Hauser, DO regarding development and update of comprehensive plan of care as evidenced by provider attestation and co-signature Inter-disciplinary care team collaboration (see longitudinal plan of care) Provided patient with basic written and verbal COPD education on self care/management/and exacerbation prevention. 10-26-2020: The patient denies any issues today with her breathing. States that she gets easily short of breath with increased activity. The patient states that she is staying in and not getting out at all. She does not like not being able to socialize but states that she knows she needs to stay in and not risk being sick. 12-28-2020: Review of COPD and the patient states she still gets shob when ambulating but she is staying in and does not go out at all unless she needs to do so.  Provided patient with COPD action plan and reinforced importance of daily self assessment. 12-28-2020: Is mindful of changes in her breathing. States she is no longer taking Trelegy as she thinks this was causing her to have more congestion and wheezing. She feels like she was "gurgling" a lot when taking the Trelegy and has noticed since stopping it she has not had congestion.  She has not been taking for about 1 month now. She continues to take albuterol and Mucinex when needed. Endorses taking the flu vaccine each year but states she has not received any of the COVID vaccinations. She denies any acute distress. Will have follow up with the pharm D on Friday of this week. Encouraged her to discuss medications with the pharm D. Will continue to monitor.  Provided written and verbal instructions on pursed lip breathing and utilized returned demonstration as teach back Provided instruction about proper use of medications  used for management of COPD including inhalers Advised patient to self assesses COPD action plan zone and make appointment with provider if in the yellow zone for 48 hours without improvement. Provided patient with education about the role of exercise in the management of COPD. 12-28-2020: The patient is limited in her ability to exercise due to her shortness of breath and limited mobility.  Advised patient to engage in light exercise as tolerated 3-5 days a week Provided education about and advised patient to utilize infection prevention strategies to reduce risk of respiratory infection. 12-28-2020: Review of triggers that cause the patient to be at higher risk of infection. The patient states that she stays in her condo most of the time and does not go out unless she absolutely has to do so.   Review of upcoming appointments. 02-23-2021 at 240 pm Patient Goals/Self-Care Activities:  - decision-making supported - depression screen reviewed - emotional support provided - family involvement promoted - problem-solving facilitated - relaxation techniques promoted - verbalization of feelings encouraged Follow Up Plan: Telephone follow up appointment with care management team member  scheduled for: 03-01-2021 at 0900 am    Care Plan : RNCM: Hypertension (Adult)  Updates made by Vanita Ingles, RN since 12/28/2020 12:00 AM     Problem: RNCM: HTN   Priority: Medium     Long-Range Goal: RNCM: Hypertension Monitored   Start Date: 06/04/2020  Expected End Date: 08/22/2021  This Visit's Progress: On track  Recent Progress: On track  Priority: Medium  Note:   Objective:  Last practice recorded BP readings:  BP Readings from Last 3 Encounters:  09/21/20 (!) 113/42  03/02/20 (!) 127/52  10/16/19 (!) 122/56   Most recent eGFR/CrCl: No results found for: EGFR  No components found for: CRCL Current Barriers:  Knowledge Deficits related to basic understanding of hypertension pathophysiology and self  care management Knowledge Deficits related to understanding of medications prescribed for management of hypertension Transportation barriers Limited Social Support Unable to independently manage HTN Lacks social connections Does not contact provider office for questions/concerns Case Manager Clinical Goal(s):  patient will verbalize understanding of plan for hypertension management patient will attend all scheduled medical appointments: 02-23-2021 at 240 pm patient will demonstrate improved adherence to prescribed treatment plan for hypertension as evidenced by taking all medications as prescribed, monitoring and recording blood pressure as directed, adhering to low sodium/DASH diet patient will demonstrate improved health management independence as evidenced by checking blood pressure as directed and notifying PCP if SBP>160 or DBP > 90, taking all medications as prescribe, and adhering to a low sodium diet as discussed. patient will verbalize basic understanding of hypertension disease process and self health management plan as evidenced by compliance with medications, compliance with heart healthy diet, and working with the CCM team to optimize health and well being.  Interventions:  Collaboration with Olin Hauser, DO regarding development and update of comprehensive plan of care as evidenced by provider attestation and co-signature Inter-disciplinary care team collaboration (see longitudinal plan of care) UNABLE to independently:manage HTN Evaluation of current treatment plan related to hypertension self management and patient's adherence to plan as established by provider. 12-28-2020: The patient has the ability to take her blood pressure but does not do so because it stresses her out. She states she doesn't like to get upset. Denies any headaches. Does has dizziness often. States it is not worse than usual. Will continue to monitor for changes.  Provided education to patient re:  stroke prevention, s/s of heart attack and stroke, DASH diet, complications of uncontrolled blood pressure Reviewed medications with patient and discussed importance of compliance. 12-28-2020: States compliance with medications. Denies any acute issues at this time.  Discussed plans with patient for ongoing care management follow up and provided patient with direct contact information for care management team Advised patient, providing education and rationale, to monitor blood pressure daily and record, calling PCP for findings outside established parameters.  Reviewed scheduled/upcoming provider appointments including: 02-23-2021 at 240 pm Self-Care Activities: - Self administers medications as prescribed Attends all scheduled provider appointments Calls provider office for new concerns, questions, or BP outside discussed parameters Checks BP and records as discussed Follows a low sodium diet/DASH diet Patient Goals: - check blood pressure weekly - choose a place to take my blood pressure (home, clinic or office, retail store) - write blood pressure results in a log or diary - agree on reward when goals are met - agree to work together to make changes - ask questions to understand - have a family meeting to talk about healthy habits - learn  about high blood pressure  Follow Up Plan: Telephone follow up appointment with care management team member scheduled for: 03-01-2021 at 0900 am    Care Plan : RNCM: Depression (Adult) and Anxiety  Updates made by Vanita Ingles, RN since 12/28/2020 12:00 AM     Problem: RNCM: Symptoms (Depression) and Anxiety   Priority: High  Onset Date: 06/04/2020     Long-Range Goal: Symptoms Monitored and Managed   Start Date: 06/04/2020  Expected End Date: 10/21/2021  This Visit's Progress: On track  Recent Progress: On track  Priority: High  Note:   Current Barriers:  Ineffective Self Health Maintenance in a patient with Anxiety and Depression Unable to  independently manage anxiety and depression  Lacks social connections Does not contact provider office for questions/concerns Clinical Goal(s):  Collaboration with Parks Ranger, Devonne Doughty, DO regarding development and update of comprehensive plan of care as evidenced by provider attestation and co-signature Inter-disciplinary care team collaboration (see longitudinal plan of care) patient will work with care management team to address care coordination and chronic disease management needs related to Disease Management Educational Needs Care Coordination Psychosocial Support Mental Health Counseling Level of Care Concerns   Interventions:  Evaluation of current treatment plan related to Anxiety and Depression, Limited social support and Mental Health Concerns  self-management and patient's adherence to plan as established by provider. 12-28-2020: The patient gets very lonely and depressed. She states it is not worse than usual and she has a long standing history or depression. It runs in her family. She states she is just old and all her siblings have passed. She also does not have any friends alive.  She has 2 sons and a daughter in Sports coach. One of her sons is in "bad shape", likely worse than she is. She states that she does see her other son and daughter in law every Sunday and this makes her feel better. If she could afford it she would go to an assisted living but states it is expensive to do that. She knows only a couple of people in her area and she states they don't want to be around and "old woman". She says that her mama always told her getting old was not for "sissy's".  She says she will keep on keeping on until the [facility] decides to take her home.  Denies any SI.  Empathetic support and listening.  Collaboration with Olin Hauser, DO regarding development and update of comprehensive plan of care as evidenced by provider attestation       and co-signature Inter-disciplinary care team  collaboration (see longitudinal plan of care). 12-28-2020: Works with the [facility] and LCSW for ongoing support and education. Is thankful for the collaboration of the CCM team.  Discussed plans with patient for ongoing care management follow up and provided patient with direct contact information for care management team Self Care Activities:  Patient verbalizes understanding of plan to effective management of anxiety and depression  Self administers medications as prescribed Attends all scheduled provider appointments Calls pharmacy for medication refills Attends church or other social activities Performs ADL's independently Performs IADL's independently Calls provider office for new concerns or questions Patient Goals: - activity or exercise based on tolerance encouraged - depression screen reviewed - emotional support provided - healthy lifestyle promoted - medication side effects monitored and managed - pain managed - participation in mental health treatment encouraged - quality of sleep assessed - response to mental health treatment monitored - social activities and  relationships encouraged - substance use assessed Follow Up Plan: Telephone follow up appointment with care management team member scheduled for: 03-01-2021 at 0900 am     Plan:Telephone follow up appointment with care management team member scheduled for:  03-01-2021 at 0900 am  Noreene Larsson RN, MSN, [place] Mobile: [phone]

## 2020-12-28 NOTE — Patient Instructions (Signed)
Visit Information  PATIENT GOALS:  Goals Addressed             This Visit's Progress    RNCM: Prevent Falls and Injury       Follow Up Date 03/01/2021    - always use handrails on the stairs - always wear low-heeled or flat shoes or slippers with nonskid soles - call the doctor if I am feeling too drowsy - install bathroom grab bars - keep a flashlight by the bed - keep my cell phone with me always - learn how to get back up if I fall - make an emergency alert plan in case I fall - pick up clutter from the floors - use a nonslip pad with throw rugs, or remove them completely - use a cane or walker - use a nightlight in the bathroom - wear my glasses and/or hearing aid - attend therapy    Why is this important?   Most falls happen when it is hard for you to walk safely. Your balance may be off because of an illness. You may have pain in your knees, hip or other joints.  You may be overly tired or taking medicines that make you sleepy. You may not be able to see or hear clearly.  Falls can lead to broken bones, bruises or other injuries.  There are things you can do to help prevent falling.     Notes: 12-28-2020:  The patient has not had any falls >6 months. The patient easily gets short of breath. The patient states that she is very careful.  Does not drive any more. Her DIL takes her to her MD appointments and to get groceries. She looks forward to her son and DIL visiting each Sunday. That makes her happy. She denies any new concerns with mobility. Will continue to monitor for changes.      RNCM: Track and Manage My Triggers-COPD       Timeframe:  Long-Range Goal Priority:  High Start Date:     12-28-2020                        Expected End Date:  12-28-2021                     Follow Up Date 03/01/2021    - avoid second hand smoke - eliminate smoking in my home - identify and avoid work-related triggers - identify and remove indoor air pollutants - limit outdoor  activity during cold weather - listen for public air quality announcements every day    Why is this important?   Triggers are activities or things, like tobacco smoke or cold weather, that make your COPD (chronic obstructive pulmonary disease) flare-up.  Knowing these triggers helps you plan how to stay away from them.  When you cannot remove them, you can learn how to manage them.     Notes: 12-28-2020: The patient was using Trelegy but states that she was gurgling and had a lot of wheezing. She has not been using for about a month now and states her breathing is much better now. The patient denies any acute issues. States she gets easily short of breath when ambulating. Does still use her albuterol and her Mucinex when needed. She does get the flu vaccine each year.         The patient verbalized understanding of instructions, educational materials, and care plan provided today and declined offer to receive  copy of patient instructions, educational materials, and care plan.   Telephone follow up appointment with care management team member scheduled for: 03-01-2021 at 0900 am  Noreene Larsson RN, MSN, [place] Mobile: [phone]

## 2021-01-01 ENCOUNTER — Ambulatory Visit: Payer: Medicare Other | Admitting: Pharmacist

## 2021-01-01 DIAGNOSIS — I10 Essential (primary) hypertension: Secondary | ICD-10-CM

## 2021-01-01 DIAGNOSIS — J449 Chronic obstructive pulmonary disease, unspecified: Secondary | ICD-10-CM

## 2021-01-01 NOTE — Patient Instructions (Signed)
Visit Information  PATIENT GOALS:  Goals Addressed             This Visit's Progress    Pharmacy Goals       Please check your home blood pressure, keep a log of the results and bring this with you to your medical appointments.   Feel free to call me with any questions or concerns. I look forward to our next call!   Wallace Cullens, PharmD, [place] [phone]        The patient verbalized understanding of instructions, educational materials, and care plan provided today and declined offer to receive copy of patient instructions, educational materials, and care plan.   Telephone follow up appointment with care management team member scheduled for: 12/14 at 11:15 am

## 2021-01-01 NOTE — Chronic Care Management (AMB) (Signed)
Chronic Care Management Pharmacy Note  01/01/2021 Name:  Candace Tapia MRN:  [id number] DOB:  05/30/1935   Subjective: Candace Tapia is an 85 y.o. year old female who is a primary patient of Olin Hauser, DO.  The CCM team was consulted for assistance with disease management and care coordination needs.    Engaged with patient by telephone for follow up visit in response to provider referral for pharmacy case management and/or care coordination services.   Consent to Services:  The patient was given information about Chronic Care Management services, agreed to services, and gave verbal consent prior to initiation of services.  Please see initial visit note for detailed documentation.   Patient Care Team: Olin Hauser, DO as PCP - General (Family Medicine) Erby Pian, MD as Referring Physician (Specialist) Lequita Asal, MD (Inactive) as Referring Physician (Hematology and Oncology) Curley Spice Virl Diamond, RPH-CPP as Pharmacist Vanita Ingles, RN as Case Manager (General Practice) Rebekah Chesterfield, LCSW as Social Worker (Licensed Clinical Social Worker)  Hospital visits: None in previous 6 months  Objective:  Lab Results  Component Value Date   CREATININE 0.80 02/21/2020   CREATININE 0.78 01/02/2019   CREATININE 0.90 10/02/2018   Social History   Tobacco Use  Smoking Status Former   Packs/day: 2.00   Years: 40.00   Pack years: 80.00   Types: Cigarettes   Quit date: 04/19/1993   Years since quitting: 27.7  Smokeless Tobacco Never   BP Readings from Last 3 Encounters:  09/21/20 (!) 113/42  03/02/20 (!) 127/52  10/16/19 (!) 122/56   Pulse Readings from Last 3 Encounters:  09/21/20 92  03/02/20 86  10/16/19 94   Wt Readings from Last 3 Encounters:  09/21/20 222 lb (100.7 kg)  07/21/20 210 lb (95.3 kg)  03/02/20 220 lb (99.8 kg)    Assessment: Review of patient past medical history, allergies, medications, health  status, including review of consultants reports, laboratory and other test data, was performed as part of comprehensive evaluation and provision of chronic care management services.   SDOH:  (Social Determinants of Health) assessments and interventions performed: none   CCM Care Plan  Allergies  Allergen Reactions   Tramadol Hcl Itching    Per pt it caused me to itch all over   Statins     Not sure what exact med was; just made her feel bad (can't elaborate)   Penicillin G Rash   Penicillins Rash    Has patient had a PCN reaction causing immediate rash, facial/tongue/throat swelling, SOB or lightheadedness with hypotension: No Has patient had a PCN reaction causing severe rash involving mucus membranes or skin necrosis: No Has patient had a PCN reaction that required hospitalization: No Has patient had a PCN reaction occurring within the last 10 years: No If all of the above answers are "NO", then may proceed with Cephalosporin use.   Sulfa Antibiotics Other (See Comments) and Rash    Medications Reviewed Today     Reviewed by Rennis Petty, RPH-CPP (Pharmacist) on 01/01/21 at 1037  Med List Status: <None>   Medication Order Taking? Sig Documenting Provider Last Dose Status Informant  acetaminophen (TYLENOL) 650 MG CR tablet [id number]  Take 650 mg by mouth every 8 (eight) hours as needed for pain. [provider]  Active Self  albuterol (VENTOLIN HFA) 108 (90 Base) MCG/ACT inhaler [id number] Yes Inhale 1 puff into the lungs every 6 (six) hours as needed.  [provider] Taking Active Self           Med Note Fabio Bering   Sat May 13, 2017 10:07 AM)    amLODipine (NORVASC) 5 MG tablet [id number]  Take 1 tablet (5 mg total) by mouth daily. Olin Hauser, DO  Active   aspirin 81 MG chewable tablet [id number]  Chew 81 mg by mouth daily as needed.   Patient not taking: No sig reported   [provider]  Active            Med Note (HILL,  TIFFANY A   Tue Jan 01, 2019  3:30 PM) Takes every once in a while  chlorthalidone (HYGROTON) 25 MG tablet [id number]  Take 1 tablet (25 mg total) by mouth daily. Olin Hauser, DO  Active   Cholecalciferol 25 MCG (1000 UT) capsule [id number]  Vitamin D3 25 mcg (1,000 unit) capsule  1 po qd [provider]  Active     Discontinued 08/18/19 1134          Med Note Fabio Bering   Sat May 13, 2017 10:03 AM)    fluticasone (FLONASE) 50 MCG/ACT nasal spray [id number]  Place 1 spray into both nostrils as needed.  [provider]  Active Self  Fluticasone-Umeclidin-Vilant (TRELEGY ELLIPTA) 100-62.5-25 MCG/INH AEPB [id number] No Inhale 1 puff into the lungs daily.  Patient not taking: No sig reported   [provider] Not Taking Active   gabapentin (NEURONTIN) 400 MG capsule [id number]  TAKE 1 CAPSULE(400 MG) BY MOUTH TWICE DAILY Karamalegos, Devonne Doughty, DO  Active   losartan (COZAAR) 100 MG tablet [id number]  Take 1 tablet (100 mg total) by mouth daily. Karamalegos, Devonne Doughty, DO  Active   meclizine (ANTIVERT) 25 MG tablet [id number]  Take 0.5 tablets (12.5 mg total) by mouth every 8 (eight) hours as needed for dizziness. Karamalegos, Devonne Doughty, DO  Active   Multiple Vitamins-Minerals (MULTIVITAMIN WOMEN 50+) TABS [id number]  Take 1 tablet by mouth daily.  [provider]  Active Self  NEXIUM 20 MG capsule [id number]  Take 1 capsule (20 mg total) by mouth daily. Arlis Porta., MD  Active Self  potassium chloride (MICRO-K) 10 MEQ CR capsule [id number]  Take 1 capsule (10 mEq total) by mouth daily. May open capsule and sprinkle over soft food or swallow whole Parks Ranger, Devonne Doughty, DO  Active   Spacer/Aero-Holding Chambers (AEROCHAMBER PLUS) inhaler [id number]  Use as instructed Melynda Ripple, MD  Active   Med List Note Ignatius Specking, RN 02/08/16 1505): UDS 02/08/16 meds due 03/09/16            Patient Active Problem List   Diagnosis  Date Noted   Obesity (BMI 35.0-39.9 without comorbidity) 01/09/2019   Peripheral polyneuropathy 12/27/2017   Vestibular disorder 03/20/2017   CKD (chronic kidney disease), stage II 12/09/2016   Chronic venous insufficiency 11/25/2016   Bilateral lower extremity edema 10/27/2016   Pre-diabetes 08/12/2016   Carotid bruit 02/09/2016   History of left breast cancer 02/09/2016   Long term current use of opiate analgesic 02/08/2016   Long term prescription opiate use 02/08/2016   Drug level above therapeutic range 02/08/2016   Chronic low back pain (Location of Tertiary source of pain) (Bilateral) (R>L) 02/08/2016   Chronic knee pain (Location of Secondary source of pain) (Bilateral) (L>R) 02/08/2016   History bilateral shoulder replacement 02/08/2016   Chronic hip pain (Location of Primary Source of Pain) (Bilateral) (R>L) 01/07/2016  Complete rupture of rotator cuff 01/07/2016   Hyperlipidemia 01/07/2016   Osteopenia 01/07/2016   Centrilobular emphysema ([place]) 07/21/2015   DDD (degenerative disc disease), lumbar 03/05/2015   Lumbar facet syndrome (Bilateral) (R>L) 03/05/2015   Sacroiliac joint dysfunction 03/05/2015   Osteoarthritis of hip (Bilateral) (R>L) 03/05/2015   DJD of shoulder 03/05/2015   Abnormal finding on mammography 03/03/2015   Back pain with radiation 03/03/2015   Major depression in full remission ([place]) 03/03/2015   Dry skin 03/03/2015   Hypokalemia 03/03/2015   Drug-induced myopathy 03/03/2015   Gastro-esophageal reflux disease without esophagitis 03/03/2015   Allergic rhinitis, seasonal 03/03/2015   Osteoarthritis of multiple joints 03/03/2015   Benign hypertension with CKD (chronic kidney disease), stage II 11/28/2014   Chronic pain 11/28/2014   Vitamin D deficiency 11/28/2014   Airway hyperreactivity 09/18/2013   Microscopic hematuria 12/20/2011    Immunization History  Administered Date(s) Administered   Fluad Quad(high Dose 65+) 01/01/2019, 03/02/2020    Influenza, High Dose Seasonal PF 02/24/2015, 02/09/2016, 12/12/2016, 01/23/2018   Influenza-Unspecified 01/16/2014   Pneumococcal Conjugate-13 08/29/2014   Pneumococcal Polysaccharide-23 04/19/2007   Tdap 01/17/2013   Zoster, Live 08/29/2014    Conditions to be addressed/monitored: HTN and COPD  Care Plan : PharmD - Med Mgmt  Updates made by Rennis Petty, RPH-CPP since 01/01/2021 12:00 AM     Problem: Disease Progression      Long-Range Goal: Disease Progression Prevented or Minimized   Start Date: 06/22/2020  Expected End Date: 09/20/2020  Recent Progress: On track  Priority: High  Note:   Current Barriers:  Knowledge deficits related to basic COPD self care/management Knowledge deficit related to basic understanding of how to use inhalers and how inhaled medications work   Public house manager limitations  Pharmacist Clinical Goal(s):  Over the next 90 days, patient will achieve adherence to monitoring guidelines and medication adherence to achieve therapeutic efficacy through collaboration with PharmD and provider.   Interventions: 1:1 collaboration with Olin Hauser, DO regarding development and update of comprehensive plan of care as evidenced by provider attestation and co-signature Inter-disciplinary care team collaboration (see longitudinal plan of care)  Chronic Obstructive Pulmonary Disease: Patient followed by [facility] Pulmonology for management of COPD Current treatment: Albuterol as needed Nocturnal oxygen Reports stopped taking Trelegy weeks ago as felt like it was "not agreeing with" her. Reports felt like it caused a "gurgling" to her breathing and feels like her breathing has improved since stopped using Trelegy Discuss rational for using maintenance inhaler for COPD management and offer to contact Pulmonologist to discuss alternatives Patient declines. States she prefers to instead wait and talk to Pulmonologist about the  inhaler/options at upcoming appointment on 10/4 Patient denies feeling like she needs a maintenance inhaler at this time. Encourage patient to monitor and follow up with Pulmonlogist  Hypertension: Current treatment: Amlodipine 5 mg daily Chlorthalidone 25 mg daily Losartan 100 mg daily Note patient has a home upper arm BP monitor Denies checking home BP recently Encourage patient to check home BP, keeping log and bring log with her to medical appointments   Patient Goals/Self-Care Activities Over the next 90 days, patient will:  Attends all scheduled provider appointments Calls pharmacy for medication refills Calls provider office for new concerns or questions  Follow Up Plan: Telephone follow up appointment with care management team member scheduled for: 12/14 at 11:15 am     Patient's preferred pharmacy is:  [facility], [place]  [address] [place] [address] Phone: [phone] Fax: [facility], [place] [address] [place] [address] Phone: [phone] Fax: [facility] [phone], [address] [place] [address] Phone: [phone] Fax: [phone]   Follow Up:  Patient agrees to Care Plan and Follow-up.  Wallace Cullens, PharmD, Para March, CPP Clinical Pharmacist [facility] [phone]

## 2021-01-15 DIAGNOSIS — J449 Chronic obstructive pulmonary disease, unspecified: Secondary | ICD-10-CM | POA: Diagnosis not present

## 2021-01-15 DIAGNOSIS — F3342 Major depressive disorder, recurrent, in full remission: Secondary | ICD-10-CM

## 2021-01-15 DIAGNOSIS — I10 Essential (primary) hypertension: Secondary | ICD-10-CM

## 2021-01-19 DIAGNOSIS — R0602 Shortness of breath: Secondary | ICD-10-CM | POA: Diagnosis not present

## 2021-01-19 DIAGNOSIS — J449 Chronic obstructive pulmonary disease, unspecified: Secondary | ICD-10-CM | POA: Diagnosis not present

## 2021-01-20 DIAGNOSIS — J449 Chronic obstructive pulmonary disease, unspecified: Secondary | ICD-10-CM | POA: Diagnosis not present

## 2021-02-03 ENCOUNTER — Ambulatory Visit (INDEPENDENT_AMBULATORY_CARE_PROVIDER_SITE_OTHER): Payer: Medicare Other | Admitting: Licensed Clinical Social Worker

## 2021-02-03 DIAGNOSIS — E782 Mixed hyperlipidemia: Secondary | ICD-10-CM

## 2021-02-03 DIAGNOSIS — G894 Chronic pain syndrome: Secondary | ICD-10-CM

## 2021-02-03 DIAGNOSIS — M5136 Other intervertebral disc degeneration, lumbar region: Secondary | ICD-10-CM

## 2021-02-03 DIAGNOSIS — F3342 Major depressive disorder, recurrent, in full remission: Secondary | ICD-10-CM

## 2021-02-04 NOTE — Patient Instructions (Signed)
Visit Information   Goals Addressed               This Visit's Progress     Patient Stated     "I really extra support at times because I'm alone" (pt-stated)   On track     Patient Self Care Activities:  Self administers medications as prescribed Attend all scheduled provider appointments Contact clinic with any questions or concerns Continue utilizing healthy coping skills      Other     SW-Track and Manage My Symptoms-Depression   On track     Timeframe:  Long-Range Goal Priority:  Medium Start Date 3/23/222                        Expected End Date: 04/17/21            Follow Up Date- 03/17/21   Patient Self Care Activities:  Self administers medications as prescribed Attend all scheduled provider appointments Contact clinic with any questions or concerns Continue utilizing healthy coping skills        Patient verbalizes understanding of instructions provided today.   Telephone follow up appointment with care management team member scheduled for:03/17/21  Christa See, MSW, [email or web address] Phone [phone] 9:03 AM

## 2021-02-04 NOTE — Chronic Care Management (AMB) (Signed)
Chronic Care Management    Clinical Social Work Note  02/04/2021 Name: Candace Tapia MRN: [id number] DOB: Oct 16, 1935  Candace Tapia is a 85 y.o. year old female who is a primary care patient of Olin Hauser, DO. The CCM team was consulted to assist the patient with chronic disease management and/or care coordination needs related to: Mental Health Counseling and Resources.   Engaged with patient by telephone for follow up visit in response to provider referral for social work chronic care management and care coordination services.   Consent to Services:  The patient was given information about Chronic Care Management services, agreed to services, and gave verbal consent prior to initiation of services.  Please see initial visit note for detailed documentation.   Patient agreed to services and consent obtained.   Consent to Services:  The patient was given information about Care Management services, agreed to services, and gave verbal consent prior to initiation of services.  Please see initial visit note for detailed documentation.   Patient agreed to services today and consent obtained.  Engaged with patient by phone in response to provider referral for social work care coordination services:  Assessment/Interventions:  Patient continues to maintain positive progress with care plan goals. She continues to receive strong support from family and reports management of depression symptoms. CCM LCSW mailed information on local independent living facility for patient to review, which would increase support and opportunities for socialization.  See Care Plan below for interventions and patient self-care activities.  Recent life changes or stressors: Management of health conditions  Recommendation: Patient may benefit from, and is in agreement work with LCSW to address care coordination needs and will continue to work with the clinical team to address health care and disease  management related needs.   Follow up Plan: Patient would like continued follow-up from CCM LCSW .  per patient's request will follow up in 03/17/21.  Will call office if needed prior to next encounter.    SDOH (Social Determinants of Health) assessments and interventions performed:    Advanced Directives Status: Not addressed in this encounter.  CCM Care Plan  Allergies  Allergen Reactions   Tramadol Hcl Itching    Per pt it caused me to itch all over   Statins     Not sure what exact med was; just made her feel bad (can't elaborate)   Penicillin G Rash   Penicillins Rash    Has patient had a PCN reaction causing immediate rash, facial/tongue/throat swelling, SOB or lightheadedness with hypotension: No Has patient had a PCN reaction causing severe rash involving mucus membranes or skin necrosis: No Has patient had a PCN reaction that required hospitalization: No Has patient had a PCN reaction occurring within the last 10 years: No If all of the above answers are "NO", then may proceed with Cephalosporin use.   Sulfa Antibiotics Other (See Comments) and Rash    Outpatient Encounter Medications as of 02/03/2021  Medication Sig Note   acetaminophen (TYLENOL) 650 MG CR tablet Take 650 mg by mouth every 8 (eight) hours as needed for pain.    albuterol (VENTOLIN HFA) 108 (90 Base) MCG/ACT inhaler Inhale 1 puff into the lungs every 6 (six) hours as needed.     amLODipine (NORVASC) 5 MG tablet Take 1 tablet (5 mg total) by mouth daily.    aspirin 81 MG chewable tablet Chew 81 mg by mouth daily as needed.  (Patient not taking: No sig reported) 01/01/2019: Dewaine Conger  every once in a while   chlorthalidone (HYGROTON) 25 MG tablet Take 1 tablet (25 mg total) by mouth daily.    Cholecalciferol 25 MCG (1000 UT) capsule Vitamin D3 25 mcg (1,000 unit) capsule  1 po qd    fluticasone (FLONASE) 50 MCG/ACT nasal spray Place 1 spray into both nostrils as needed.     Fluticasone-Umeclidin-Vilant (TRELEGY  ELLIPTA) 100-62.5-25 MCG/INH AEPB Inhale 1 puff into the lungs daily. (Patient not taking: No sig reported)    gabapentin (NEURONTIN) 400 MG capsule TAKE 1 CAPSULE(400 MG) BY MOUTH TWICE DAILY    losartan (COZAAR) 100 MG tablet Take 1 tablet (100 mg total) by mouth daily.    meclizine (ANTIVERT) 25 MG tablet Take 0.5 tablets (12.5 mg total) by mouth every 8 (eight) hours as needed for dizziness.    Multiple Vitamins-Minerals (MULTIVITAMIN WOMEN 50+) TABS Take 1 tablet by mouth daily.     NEXIUM 20 MG capsule Take 1 capsule (20 mg total) by mouth daily.    potassium chloride (MICRO-K) 10 MEQ CR capsule Take 1 capsule (10 mEq total) by mouth daily. May open capsule and sprinkle over soft food or swallow whole    Spacer/Aero-Holding Chambers (AEROCHAMBER PLUS) inhaler Use as instructed    [DISCONTINUED] fexofenadine (ALLEGRA ALLERGY) 180 MG tablet Take 180 mg by mouth as needed. Reported on 09/29/2015    No facility-administered encounter medications on file as of 02/03/2021.    Patient Active Problem List   Diagnosis Date Noted   Obesity (BMI 35.0-39.9 without comorbidity) 01/09/2019   Peripheral polyneuropathy 12/27/2017   Vestibular disorder 03/20/2017   CKD (chronic kidney disease), stage II 12/09/2016   Chronic venous insufficiency 11/25/2016   Bilateral lower extremity edema 10/27/2016   Pre-diabetes 08/12/2016   Carotid bruit 02/09/2016   History of left breast cancer 02/09/2016   Long term current use of opiate analgesic 02/08/2016   Long term prescription opiate use 02/08/2016   Drug level above therapeutic range 02/08/2016   Chronic low back pain (Location of Tertiary source of pain) (Bilateral) (R>L) 02/08/2016   Chronic knee pain (Location of Secondary source of pain) (Bilateral) (L>R) 02/08/2016   History bilateral shoulder replacement 02/08/2016   Chronic hip pain (Location of Primary Source of Pain) (Bilateral) (R>L) 01/07/2016   Complete rupture of rotator cuff 01/07/2016    Hyperlipidemia 01/07/2016   Osteopenia 01/07/2016   Centrilobular emphysema ([place]) 07/21/2015   DDD (degenerative disc disease), lumbar 03/05/2015   Lumbar facet syndrome (Bilateral) (R>L) 03/05/2015   Sacroiliac joint dysfunction 03/05/2015   Osteoarthritis of hip (Bilateral) (R>L) 03/05/2015   DJD of shoulder 03/05/2015   Abnormal finding on mammography 03/03/2015   Back pain with radiation 03/03/2015   Major depression in full remission ([place]) 03/03/2015   Dry skin 03/03/2015   Hypokalemia 03/03/2015   Drug-induced myopathy 03/03/2015   Gastro-esophageal reflux disease without esophagitis 03/03/2015   Allergic rhinitis, seasonal 03/03/2015   Osteoarthritis of multiple joints 03/03/2015   Benign hypertension with CKD (chronic kidney disease), stage II 11/28/2014   Chronic pain 11/28/2014   Vitamin D deficiency 11/28/2014   Airway hyperreactivity 09/18/2013   Microscopic hematuria 12/20/2011    Conditions to be addressed/monitored: Depression and Chronic Pain ; Mental Health Concerns   Care Plan : General Social Work (Adult)  Updates made by Rebekah Chesterfield, LCSW since 02/04/2021 12:00 AM     Problem: Quality of Life (General Plan of Care)      Long-Range Goal: Quality of Life Maintained  Start Date: 07/08/2020  This Visit's Progress: On track  Recent Progress: On track  Priority: Medium  Note:   Timeframe:  Long-Range Goal Priority:  Medium Start Date 3/23/222                        Expected End Date: 04/17/21            Follow Up Date- 03/17/21   Current Barriers:  Financial constraints related to affording in home support or eventual long term care placement Limited social support Level of care concerns ADL IADL limitations Limited access to caregiver Inability to perform ADL's independently Inability to perform IADL's independently Ongoing depression Clinical Social Work Clinical Goal(s):  Over the next 120 days, patient will work with SW to address  concerns related to increasing depression and anxiety management and gaining additional support within the home  Interventions: Assessed patient's previous and current treatment, coping skills, support system and barriers to care Patient interviewed and appropriate assessments performed Patient reports that she is managing symptoms of depression well. States, "I'm doing the best i can" 10/19: Patient reports depression symptoms have remained the same, denies increase in symptoms. Patient denies SI/HI Patient endorses increase in fatigue due to waking up throughout the night. She also experiences pain in her hips and back from arthritis 10/19: Patient reports continued difficulty getting around due to SOB. She states, "I'm getting by" Patient is compliant with pain meds, stating "they work well" Patient continues to utilize a walker to promote safety. Patient denies any recent falls. Patient utilizes Med Alert to assist with promoting safety in the event of a fall Strategies to increase socialization discussed. Patient continues to receive strong support from family, who visit routinely. CCM LCSW informed patient that independent living facilities do not accept insurance and provides ample opportunity to socialize with peers. Patient was very interested in obtaining additional information and CCM LCSW agreed to send her information on [facility] Patient was successful in identifying healthy coping skills to assist with management of symptoms. Patient enjoys listening to music to assist with management of symptoms. She is aware of the variety in audiobooks, as well Patient endorses long hx of depression and anxiety symptoms triggered by grief of loved ones and chronic medical conditions. Patient reports that she has difficulty with hearing over the phone and her eyesight has worsened; however, feels depression/anxiety symptoms are managed at this time Patient reports that her daughter in law continues to  get her groceries for her and provides transportation for her medical appointments. She has two sons that assist with her care. She reports that her family visits her usually once per week on Sundays Upcoming appointments reviewed. Her Daughter manages patient's appointments and provides transportation Solution-Focused Strategies, Active listening / Reflection utilized , Emotional Supportive Provided, Verbalization of feelings encouraged , and Suicidal Ideation/Homicidal Ideation assessed: ; Discussed plans with patient for ongoing care management follow up and provided patient with direct contact information for care management team Collaboration with PCP regarding development and update of comprehensive plan of care as evidenced by provider attestation and co-signature Inter-disciplinary care team collaboration (see longitudinal plan of care) Patient Self Care Activities:  Self administers medications as prescribed Attend all scheduled provider appointments Contact clinic with any questions or concerns Continue utilizing healthy coping skills       Christa See, MSW, [email or web address] Phone [phone] 9:00 AM

## 2021-02-15 ENCOUNTER — Other Ambulatory Visit: Payer: Self-pay

## 2021-02-15 DIAGNOSIS — E782 Mixed hyperlipidemia: Secondary | ICD-10-CM

## 2021-02-15 DIAGNOSIS — Z0000 Encounter for general adult medical examination without abnormal findings: Secondary | ICD-10-CM

## 2021-02-15 DIAGNOSIS — R7303 Prediabetes: Secondary | ICD-10-CM

## 2021-02-15 DIAGNOSIS — F3342 Major depressive disorder, recurrent, in full remission: Secondary | ICD-10-CM

## 2021-02-15 DIAGNOSIS — N182 Chronic kidney disease, stage 2 (mild): Secondary | ICD-10-CM

## 2021-02-15 DIAGNOSIS — E559 Vitamin D deficiency, unspecified: Secondary | ICD-10-CM

## 2021-02-15 DIAGNOSIS — E876 Hypokalemia: Secondary | ICD-10-CM

## 2021-02-15 DIAGNOSIS — I129 Hypertensive chronic kidney disease with stage 1 through stage 4 chronic kidney disease, or unspecified chronic kidney disease: Secondary | ICD-10-CM

## 2021-02-16 ENCOUNTER — Other Ambulatory Visit: Payer: Medicare Other

## 2021-02-16 ENCOUNTER — Other Ambulatory Visit: Payer: Self-pay

## 2021-02-16 DIAGNOSIS — R7303 Prediabetes: Secondary | ICD-10-CM | POA: Diagnosis not present

## 2021-02-16 DIAGNOSIS — N182 Chronic kidney disease, stage 2 (mild): Secondary | ICD-10-CM | POA: Diagnosis not present

## 2021-02-16 DIAGNOSIS — E559 Vitamin D deficiency, unspecified: Secondary | ICD-10-CM | POA: Diagnosis not present

## 2021-02-16 DIAGNOSIS — I129 Hypertensive chronic kidney disease with stage 1 through stage 4 chronic kidney disease, or unspecified chronic kidney disease: Secondary | ICD-10-CM | POA: Diagnosis not present

## 2021-02-16 DIAGNOSIS — E782 Mixed hyperlipidemia: Secondary | ICD-10-CM | POA: Diagnosis not present

## 2021-02-17 LAB — CBC WITH DIFFERENTIAL/PLATELET
Absolute Monocytes: 564 cells/uL (ref 200–950)
Basophils Absolute: 50 cells/uL (ref 0–200)
Basophils Relative: 0.6 %
Eosinophils Absolute: 125 cells/uL (ref 15–500)
Eosinophils Relative: 1.5 %
HCT: 42.3 % (ref 35.0–45.0)
Hemoglobin: 13.4 g/dL (ref 11.7–15.5)
Lymphs Abs: 1228 cells/uL (ref 850–3900)
MCH: 29.2 pg (ref 27.0–33.0)
MCHC: 31.7 g/dL — ABNORMAL LOW (ref 32.0–36.0)
MCV: 92.2 fL (ref 80.0–100.0)
MPV: 10.9 fL (ref 7.5–12.5)
Monocytes Relative: 6.8 %
Neutro Abs: 6333 cells/uL (ref 1500–7800)
Neutrophils Relative %: 76.3 %
Platelets: 295 10*3/uL (ref 140–400)
RBC: 4.59 10*6/uL (ref 3.80–5.10)
RDW: 13.4 % (ref 11.0–15.0)
Total Lymphocyte: 14.8 %
WBC: 8.3 10*3/uL (ref 3.8–10.8)

## 2021-02-17 LAB — COMPLETE METABOLIC PANEL WITH GFR
AG Ratio: 1.8 (calc) (ref 1.0–2.5)
ALT: 22 U/L (ref 6–29)
AST: 20 U/L (ref 10–35)
Albumin: 4.4 g/dL (ref 3.6–5.1)
Alkaline phosphatase (APISO): 63 U/L (ref 37–153)
BUN: 16 mg/dL (ref 7–25)
CO2: 30 mmol/L (ref 20–32)
Calcium: 9.6 mg/dL (ref 8.6–10.4)
Chloride: 99 mmol/L (ref 98–110)
Creat: 0.82 mg/dL (ref 0.60–0.95)
Globulin: 2.4 g/dL (calc) (ref 1.9–3.7)
Glucose, Bld: 107 mg/dL — ABNORMAL HIGH (ref 65–99)
Potassium: 4.1 mmol/L (ref 3.5–5.3)
Sodium: 141 mmol/L (ref 135–146)
Total Bilirubin: 0.4 mg/dL (ref 0.2–1.2)
Total Protein: 6.8 g/dL (ref 6.1–8.1)
eGFR: 71 mL/min/{1.73_m2} (ref >=60)

## 2021-02-17 LAB — VITAMIN D 25 HYDROXY (VIT D DEFICIENCY, FRACTURES): Vit D, 25-Hydroxy: 28 ng/mL — ABNORMAL LOW (ref 30–100)

## 2021-02-17 LAB — HEMOGLOBIN A1C
Hgb A1c MFr Bld: 5.6 % of total Hgb (ref <5.7)
Mean Plasma Glucose: 114 mg/dL
eAG (mmol/L): 6.3 mmol/L

## 2021-02-17 LAB — LIPID PANEL
Cholesterol: 202 mg/dL — ABNORMAL HIGH (ref <200)
HDL: 50 mg/dL (ref >=50)
LDL Cholesterol (Calc): 116 mg/dL (calc) — ABNORMAL HIGH
Non-HDL Cholesterol (Calc): 152 mg/dL (calc) — ABNORMAL HIGH (ref <130)
Total CHOL/HDL Ratio: 4 (calc) (ref <5.0)
Triglycerides: 238 mg/dL — ABNORMAL HIGH (ref <150)

## 2021-02-17 LAB — TSH: TSH: 2.58 mIU/L (ref 0.40–4.50)

## 2021-02-20 DIAGNOSIS — J449 Chronic obstructive pulmonary disease, unspecified: Secondary | ICD-10-CM | POA: Diagnosis not present

## 2021-02-23 ENCOUNTER — Encounter: Payer: Self-pay | Admitting: Family Medicine

## 2021-02-23 ENCOUNTER — Other Ambulatory Visit: Payer: Self-pay

## 2021-02-23 ENCOUNTER — Ambulatory Visit (INDEPENDENT_AMBULATORY_CARE_PROVIDER_SITE_OTHER): Payer: Medicare Other | Admitting: Family Medicine

## 2021-02-23 VITALS — BP 139/71 | HR 84 | Ht 65.5 in | Wt 218.0 lb

## 2021-02-23 DIAGNOSIS — I129 Hypertensive chronic kidney disease with stage 1 through stage 4 chronic kidney disease, or unspecified chronic kidney disease: Secondary | ICD-10-CM

## 2021-02-23 DIAGNOSIS — N182 Chronic kidney disease, stage 2 (mild): Secondary | ICD-10-CM | POA: Diagnosis not present

## 2021-02-23 DIAGNOSIS — F3342 Major depressive disorder, recurrent, in full remission: Secondary | ICD-10-CM | POA: Diagnosis not present

## 2021-02-23 DIAGNOSIS — E782 Mixed hyperlipidemia: Secondary | ICD-10-CM | POA: Diagnosis not present

## 2021-02-23 DIAGNOSIS — J432 Centrilobular emphysema: Secondary | ICD-10-CM

## 2021-02-23 DIAGNOSIS — Z0000 Encounter for general adult medical examination without abnormal findings: Secondary | ICD-10-CM

## 2021-02-23 DIAGNOSIS — Z23 Encounter for immunization: Secondary | ICD-10-CM

## 2021-02-23 NOTE — Assessment & Plan Note (Signed)
Mild elevated LDL Discussed restart statin therapy lower dose other agent she declines for ASCVD risk only

## 2021-02-23 NOTE — Assessment & Plan Note (Signed)
Controlled HTN  Stable K Some dizziness is chronic, seems unrelated to medication side effect or HTN - Home BP readings improved Complication with CKD-II    Plan:  Continue current BP regimen Losartan 100mg daily, Amlodipine 5mg, Chlorthalidone 25mg daily Encourage improved lifestyle - low sodium diet, regular exercise Continue monitor BP outside office, bring readings to next visit, if persistently >140/90 or new symptoms notify office sooner 

## 2021-02-23 NOTE — Assessment & Plan Note (Signed)
Chronic problem, currently stable without flare Followed by [facility] On trelegy Has upcoming ECHO per [facility]

## 2021-02-23 NOTE — Progress Notes (Signed)
Subjective:    Patient ID: Candace Tapia, female    DOB: 1935/09/15, 85 y.o.   MRN: [id number]  Candace Tapia is a 85 y.o. female presenting on 02/23/2021 for Annual Exam   HPI  Here for Annual Physical and Lab Reivew.  Pre-Diabetes / obesity BMI >35 Reports no concerns. Last A1c trend 5.6 Doing well Meds: Never on meds Limited diet and exercise, admits difficulty exercising due to back hip pain arthritis Denies hypoglycemia, polyuria, visual changes, numbness or tingling.   CHRONIC HTN / Peripheral Edema / Hypokalemia Improved BP control and edema Current Meds - Losartan 166m daily, Chlorthalidone 2639mdaily, Amlodipine 39m51maily   Reports good compliance, took meds today. Taking Potassium 82m83maily now   HYPERLIPIDEMIA: - Reports concerns she cannot take cholesterol statin medicine due to myopathy  Last lipid panel 02/2021 elevated LDL She declines Statin again   Vitamin D Last lab result 28 improved taking OTC vitamin D3 1,000 most days.   Osteoarthritis, multiple joints Chronic joint pain multiple joints. Prior history of shoulder replacement surgery in past She had been on Hydrocodone years ago and did well only short term. She takes Tylenol Ext STr 500mg81m twice a day asking about taking additional dose.  Left lower side flank pain She wakes up and has some pain She has memory foam mattress, difficulty turning over Has hip pain episodic bilateral Asks about change of mattress   Chronic [facility] lightheadedness On Meclizine PRN   Recurrent Depression, - in remission Reports she has prior history of some depressed mood but overall has managed to cope with it for long time and currently doing well. She says she is alone. She has limited family members around. She has some friends, one with alzheimer's dementia. Cannot tolerate medication, remains off    Centrilobular Emphysema  (COPD) Followed by Dr FlemiMinerva Areola On oxygen at night, nocturnal up to 2L Using inhalers Continue Trelegy She is doing well but often can get short of breath with exertion, worse with mask Has some swelling.  COPD congestion cough episodic, has upcoming ECHO per Dr FlemiRaul Del.  Health Maintenance: Due for Flu Shot, will receive today    Dep[facility] 09/21/2020 07/21/2020  Decreased Interest _0 Down, Depressed, Hopeless _1 PHQ - 2 Score _2 Altered sleeping 1 0 0  Tired, decreased energy _3 Change in appetite 1 0 0  Feeling bad or failure about yourself  1 0 3  Trouble concentrating 1 0 1  Moving slowly or fidgety/restless 0 0 0  Suicidal thoughts 0 - 0  PHQ-9 Score _4 Difficult doing work/chores Not difficult at all Somewhat difficult Somewhat difficult  Some recent data might be hidden    Past Medical History:  Diagnosis Date   Allergy    Arthritis    Breast cancer (HCC) [place]4   radiation and taking exemestane   Cancer of left breast (HCC) [place]1/2016   GERD (gastroesophageal reflux disease)    H/O: hysterectomy    History of lumpectomy 2014   LT breast   Past Surgical History:  Procedure Laterality Date   ABDOMINAL HYSTERECTOMY     BREAST LUMPECTOMY Left 2014   BREAST SURGERY Left 2014   JOINT REPLACEMENT     LYMPH NODE DISSECTION     neck   TOTAL SHOULDER  REPLACEMENT Bilateral 2010 and 2013   Social History   Socioeconomic History   Marital status: Widowed    Spouse name: Not on file   Number of children: Not on file   Years of education: Not on file   Highest education level: 8th grade  Occupational History   Not on file  Tobacco Use   Smoking status: Former    Packs/day: 2.00    Years: 40.00    Pack years: 80.00    Types: Cigarettes    Quit date: 04/19/1993    Years since quitting: 27.8   Smokeless tobacco: Never  Vaping Use   Vaping Use: Not on file  Substance and Sexual Activity   Alcohol use: No     Alcohol/week: 0.0 standard drinks   Drug use: No   Sexual activity: Not Currently  Other Topics Concern   Not on file  Social History Narrative   Not on file   Social Determinants of Health   Financial Resource Strain: Low Risk    Difficulty of Paying Living Expenses: Not hard at all  Food Insecurity: No Food Insecurity   Worried About Charity fundraiser in the Last Year: Never true   [place] in the Last Year: Never true  Transportation Needs: No Transportation Needs   Lack of Transportation (Medical): No   Lack of Transportation (Non-Medical): No  Physical Activity: Inactive   Days of Exercise per Week: 0 days   Minutes of Exercise per Session: 0 min  Stress: No Stress Concern Present   Feeling of Stress : Only a little  Social Connections: Socially Isolated   Frequency of Communication with Friends and Family: More than three times a week   Frequency of Social Gatherings with Friends and Family: Twice a week   Attends Religious Services: Never   Marine scientist or Organizations: No   Attends Archivist Meetings: Never   Marital Status: Widowed  Human resources officer Violence: Not At Risk   Fear of Current or Ex-Partner: No   Emotionally Abused: No   Physically Abused: No   Sexually Abused: No   Family History  Problem Relation Age of Onset   Breast cancer Mother 70   Diabetes Mother    Kidney disease Father    Diabetes Father    Breast cancer Sister 46   Current Outpatient Medications on File Prior to Visit  Medication Sig   acetaminophen (TYLENOL) 650 MG CR tablet Take 650 mg by mouth every 8 (eight) hours as needed for pain.   albuterol (VENTOLIN HFA) 108 (90 Base) MCG/ACT inhaler Inhale 1 puff into the lungs every 6 (six) hours as needed.    amLODipine (NORVASC) 5 MG tablet Take 1 tablet (5 mg total) by mouth daily.   chlorthalidone (HYGROTON) 25 MG tablet Take 1 tablet (25 mg total) by mouth daily.   Cholecalciferol 25 MCG (1000 UT)  capsule Vitamin D3 25 mcg (1,000 unit) capsule  1 po qd   fluticasone (FLONASE) 50 MCG/ACT nasal spray Place 1 spray into both nostrils as needed.    gabapentin (NEURONTIN) 400 MG capsule TAKE 1 CAPSULE(400 MG) BY MOUTH TWICE DAILY   losartan (COZAAR) 100 MG tablet Take 1 tablet (100 mg total) by mouth daily.   meclizine (ANTIVERT) 25 MG tablet Take 0.5 tablets (12.5 mg total) by mouth every 8 (eight) hours as needed for dizziness.   Multiple Vitamins-Minerals (MULTIVITAMIN WOMEN 50+) TABS Take 1 tablet by mouth daily.  NEXIUM 20 MG capsule Take 1 capsule (20 mg total) by mouth daily.   potassium chloride (MICRO-K) 10 MEQ CR capsule Take 1 capsule (10 mEq total) by mouth daily. May open capsule and sprinkle over soft food or swallow whole   Spacer/Aero-Holding Chambers (AEROCHAMBER PLUS) inhaler Use as instructed   aspirin 81 MG chewable tablet Chew 81 mg by mouth daily as needed.  (Patient not taking: No sig reported)   Fluticasone-Umeclidin-Vilant (TRELEGY ELLIPTA) 100-62.5-25 MCG/INH AEPB Inhale 1 puff into the lungs daily. (Patient not taking: No sig reported)   [DISCONTINUED] fexofenadine (ALLEGRA ALLERGY) 180 MG tablet Take 180 mg by mouth as needed. Reported on 09/29/2015   No current facility-administered medications on file prior to visit.    Review of Systems  Constitutional:  Negative for activity change, appetite change, chills, diaphoresis, fatigue and fever.  HENT:  Negative for congestion and hearing loss.   Eyes:  Negative for visual disturbance.  Respiratory:  Positive for cough. Negative for chest tightness, shortness of breath and wheezing.   Cardiovascular:  Negative for chest pain, palpitations and leg swelling.  Gastrointestinal:  Negative for abdominal pain, constipation, diarrhea, nausea and vomiting.  Genitourinary:  Negative for dysuria, frequency and hematuria.  Musculoskeletal:  Positive for arthralgias. Negative for neck pain.  Skin:  Negative for rash.   Neurological:  Negative for dizziness, weakness, light-headedness, numbness and headaches.  Hematological:  Negative for adenopathy.  Psychiatric/Behavioral:  Negative for behavioral problems, dysphoric mood and sleep disturbance.   Per HPI unless specifically indicated above     Objective:    BP 139/71   Pulse 84   Ht 5' 5.5" (1.664 m)   Wt 218 lb (98.9 kg)   SpO2 97%   BMI 35.73 kg/m   Wt Readings from Last 3 Encounters:  02/23/21 218 lb (98.9 kg)  09/21/20 222 lb (100.7 kg)  07/21/20 210 lb (95.3 kg)    Physical Exam Vitals and nursing note reviewed.  Constitutional:      General: She is not in acute distress.    Appearance: She is well-developed. She is not diaphoretic.     Comments: Well-appearing, comfortable, cooperative  HENT:     Head: Normocephalic and atraumatic.  Eyes:     General:        Right eye: No discharge.        Left eye: No discharge.     Conjunctiva/sclera: Conjunctivae normal.     Pupils: Pupils are equal, round, and reactive to light.  Neck:     Thyroid: No thyromegaly.  Cardiovascular:     Rate and Rhythm: Normal rate and regular rhythm.     Pulses: Normal pulses.     Heart sounds: Normal heart sounds. No murmur heard. Pulmonary:     Effort: Pulmonary effort is normal. No respiratory distress.     Breath sounds: Normal breath sounds. No wheezing or rales.  Abdominal:     General: Bowel sounds are normal. There is no distension.     Palpations: Abdomen is soft. There is no mass.     Tenderness: There is no abdominal tenderness.  Musculoskeletal:        General: No tenderness. Normal range of motion.     Cervical back: Normal range of motion and neck supple.     Comments: Upper / Lower Extremities: - Normal muscle tone, strength bilateral upper extremities 5/5, lower extremities 5/5  Lymphadenopathy:     Cervical: No cervical adenopathy.  Skin:    General:  Skin is warm and dry.     Findings: No erythema or rash.  Neurological:      Mental Status: She is alert and oriented to person, place, and time.     Comments: Distal sensation intact to light touch all extremities  Psychiatric:        Mood and Affect: Mood normal.        Behavior: Behavior normal.        Thought Content: Thought content normal.     Comments: Well groomed, good eye contact, normal speech and thoughts      Results for orders placed or performed in visit on 02/15/21  VITAMIN D 25 Hydroxy (Vit-D Deficiency, Fractures)  Result Value Ref Range   Vit D, 25-Hydroxy 28 (L) 30 - 100 ng/mL  TSH  Result Value Ref Range   TSH 2.58 0.40 - 4.50 mIU/L  Lipid panel  Result Value Ref Range   Cholesterol 202 (H) <200 mg/dL   HDL 50 > OR = 50 mg/dL   Triglycerides 238 (H) <150 mg/dL   LDL Cholesterol (Calc) 116 (H) mg/dL (calc)   Total CHOL/HDL Ratio 4.0 <5.0 (calc)   Non-HDL Cholesterol (Calc) 152 (H) <130 mg/dL (calc)  COMPLETE METABOLIC PANEL WITH GFR  Result Value Ref Range   Glucose, Bld 107 (H) 65 - 99 mg/dL   BUN 16 7 - 25 mg/dL   Creat 0.82 0.60 - 0.95 mg/dL   eGFR 71 > OR = 60 mL/min/1.69m   BUN/Creatinine Ratio NOT APPLICABLE 6 - 22 (calc)   Sodium 141 135 - 146 mmol/L   Potassium 4.1 3.5 - 5.3 mmol/L   Chloride 99 98 - 110 mmol/L   CO2 30 20 - 32 mmol/L   Calcium 9.6 8.6 - 10.4 mg/dL   Total Protein 6.8 6.1 - 8.1 g/dL   Albumin 4.4 3.6 - 5.1 g/dL   Globulin 2.4 1.9 - 3.7 g/dL (calc)   AG Ratio 1.8 1.0 - 2.5 (calc)   Total Bilirubin 0.4 0.2 - 1.2 mg/dL   Alkaline phosphatase (APISO) 63 37 - 153 U/L   AST 20 10 - 35 U/L   ALT 22 6 - 29 U/L  CBC with Differential/Platelet  Result Value Ref Range   WBC 8.3 3.8 - 10.8 Thousand/uL   RBC 4.59 3.80 - 5.10 Million/uL   Hemoglobin 13.4 11.7 - 15.5 g/dL   HCT 42.3 35.0 - 45.0 %   MCV 92.2 80.0 - 100.0 fL   MCH 29.2 27.0 - 33.0 pg   MCHC 31.7 (L) 32.0 - 36.0 g/dL   RDW 13.4 11.0 - 15.0 %   Platelets 295 140 - 400 Thousand/uL   MPV 10.9 7.5 - 12.5 fL   Neutro Abs 6,333 1,500 - 7,800  cells/uL   Lymphs Abs 1,228 850 - 3,900 cells/uL   Absolute Monocytes 564 200 - 950 cells/uL   Eosinophils Absolute 125 15 - 500 cells/uL   Basophils Absolute 50 0 - 200 cells/uL   Neutrophils Relative % 76.3 %   Total Lymphocyte 14.8 %   Monocytes Relative 6.8 %   Eosinophils Relative 1.5 %   Basophils Relative 0.6 %  Hemoglobin A1c  Result Value Ref Range   Hgb A1c MFr Bld 5.6 <5.7 % of total Hgb   Mean Plasma Glucose 114 mg/dL   eAG (mmol/L) 6.3 mmol/L      Assessment & Plan:   Problem List Items Addressed This Visit     [place] in full [place]  Mood in remission, chronic problem Some mild symptoms, but not endorsing new concerns      Hyperlipidemia    Mild elevated LDL Discussed restart statin therapy lower dose other agent she declines for ASCVD risk only      Centrilobular emphysema (HCC)    Chronic problem, currently stable without flare Followed by [facility] Pulmonology On trelegy Has upcoming ECHO per Pulm      Benign hypertension with CKD (chronic kidney disease), stage II    Controlled HTN  Stable K Some dizziness is chronic, seems unrelated to medication side effect or HTN - Home BP readings improved Complication with CKD-II    Plan:  Continue current BP regimen Losartan 134m daily, Amlodipine 587m Chlorthalidone 2517maily Encourage improved lifestyle - low sodium diet, regular exercise Continue monitor BP outside office, bring readings to next visit, if persistently >140/90 or new symptoms notify office sooner      Other Visit Diagnoses     Annual physical exam    -  Primary   Needs flu shot       Relevant Orders   Flu Vaccine QUAD High Dose(Fluad) (Completed)       Updated Health Maintenance information High dose Flu shot today. Considering Shingrix after 04/2021. Reviewed recent lab results with patient Encouraged improvement to lifestyle with diet and exercise Goal of weight loss   No orders of the defined types were  placed in this encounter.     Follow up plan: Return in about 6 months (around 08/23/2021) for 6 month follow-up PreDM A1c HTN COPD updates.  AleNobie PutnamO [facility] 02/23/2021, 3:04 PM

## 2021-02-23 NOTE — Patient Instructions (Addendum)
Thank you for coming to the office today.  Flu shot today.  Please schedule a Follow-up Appointment to: Return in about 6 months (around 08/23/2021) for 6 month follow-up PreDM A1c HTN COPD updates.  If you have any other questions or concerns, please feel free to call the office or send a message through [place]. You may also schedule an earlier appointment if necessary.  Additionally, you may be receiving a survey about your experience at our office within a few days to 1 week by e-mail or mail. We value your feedback.  Nobie Putnam, DO [place]

## 2021-02-23 NOTE — Assessment & Plan Note (Signed)
Mood in remission, chronic problem Some mild symptoms, but not endorsing new concerns

## 2021-03-01 ENCOUNTER — Telehealth: Payer: Medicare Other | Admitting: General Practice

## 2021-03-01 ENCOUNTER — Ambulatory Visit (INDEPENDENT_AMBULATORY_CARE_PROVIDER_SITE_OTHER): Payer: Medicare Other

## 2021-03-01 DIAGNOSIS — F3342 Major depressive disorder, recurrent, in full remission: Secondary | ICD-10-CM

## 2021-03-01 DIAGNOSIS — J449 Chronic obstructive pulmonary disease, unspecified: Secondary | ICD-10-CM

## 2021-03-01 DIAGNOSIS — J432 Centrilobular emphysema: Secondary | ICD-10-CM

## 2021-03-01 DIAGNOSIS — E782 Mixed hyperlipidemia: Secondary | ICD-10-CM

## 2021-03-01 DIAGNOSIS — I129 Hypertensive chronic kidney disease with stage 1 through stage 4 chronic kidney disease, or unspecified chronic kidney disease: Secondary | ICD-10-CM

## 2021-03-01 NOTE — Patient Instructions (Addendum)
Visit Information Patient will self administer medications as prescribed as evidenced by self report/primary caregiver report  Patient will attend all scheduled provider appointments as evidenced by clinician review of documented attendance to scheduled appointments and patient/caregiver report Patient will call pharmacy for medication refills as evidenced by patient report and review of pharmacy fill history as appropriate Patient will attend church or other social activities as evidenced by patient report Patient will continue to perform ADL's independently as evidenced by patient/caregiver report Patient will continue to perform IADL's independently as evidenced by patient/caregiver report Patient will call provider office for new concerns or questions as evidenced by review of documented incoming telephone call notes and patient report Patient will work with BSW to address care coordination needs and will continue to work with the clinical team to address health care and disease management related needs as evidenced by documented adherence to scheduled care management/care coordination appointments - eliminate smoking in my home - identify and remove indoor air pollutants - limit outdoor activity during cold weather - listen for public air quality announcements every day - do breathing exercises every day - arrange respite care for caregiver - develop a rescue plan - eliminate symptom triggers at home - follow rescue plan if symptoms flare-up - use an extra pillow to sleep - develop a new routine to improve sleep - don't eat or exercise right before bedtime - eat healthy/prescribed diet: heart healthy diet  - get at least 7 to 8 hours of sleep at night - use devices that will help like a cane, sock-puller or reacher - practice relaxation or meditation daily - do exercises in a comfortable position that makes breathing as easy as possible - check blood pressure weekly - choose a place  to take my blood pressure (home, clinic or office, retail store) - write blood pressure results in a log or diary - learn about high blood pressure - keep a blood pressure log - take blood pressure log to all doctor appointments - call doctor for signs and symptoms of high blood pressure - develop an action plan for high blood pressure - keep all doctor appointments - take medications for blood pressure exactly as prescribed - report new symptoms to your doctor - eat more whole grains, fruits and vegetables, lean meats and healthy fats - call for medicine refill 2 or 3 days before it runs out - take all medications exactly as prescribed - call doctor with any symptoms you believe are related to your medicine - call doctor when you experience any new symptoms - go to all doctor appointments as scheduled - adhere to prescribed diet: heart healthy  The patient verbalized understanding of instructions, educational materials, and care plan provided today and declined offer to receive copy of patient instructions, educational materials, and care plan.   Telephone follow up appointment with care management team member scheduled for:  Noreene Larsson RN, MSN, [place] Mobile: [phone]

## 2021-03-01 NOTE — Chronic Care Management (AMB) (Signed)
Chronic Care Management   CCM RN Visit Note  03/01/2021 Name: Candace Tapia MRN: [id number] DOB: January 30, 1936  Subjective: Candace Tapia is a 85 y.o. year old female who is a primary care patient of Olin Hauser, DO. The care management team was consulted for assistance with disease management and care coordination needs.    Engaged with patient by telephone for follow up visit in response to provider referral for case management and/or care coordination services.   Consent to Services:  The patient was given information about Chronic Care Management services, agreed to services, and gave verbal consent prior to initiation of services.  Please see initial visit note for detailed documentation.   Patient agreed to services and verbal consent obtained.   Assessment: Review of patient past medical history, allergies, medications, health status, including review of consultants reports, laboratory and other test data, was performed as part of comprehensive evaluation and provision of chronic care management services.   SDOH (Social Determinants of Health) assessments and interventions performed:    CCM Care Plan  Allergies  Allergen Reactions   Tramadol Hcl Itching    Per pt it caused me to itch all over   Statins     Not sure what exact med was; just made her feel bad (can't elaborate)   Penicillin G Rash   Penicillins Rash    Has patient had a PCN reaction causing immediate rash, facial/tongue/throat swelling, SOB or lightheadedness with hypotension: No Has patient had a PCN reaction causing severe rash involving mucus membranes or skin necrosis: No Has patient had a PCN reaction that required hospitalization: No Has patient had a PCN reaction occurring within the last 10 years: No If all of the above answers are "NO", then may proceed with Cephalosporin use.   Sulfa Antibiotics Other (See Comments) and Rash    Outpatient Encounter Medications as of 03/01/2021   Medication Sig Note   acetaminophen (TYLENOL) 650 MG CR tablet Take 650 mg by mouth every 8 (eight) hours as needed for pain.    albuterol (VENTOLIN HFA) 108 (90 Base) MCG/ACT inhaler Inhale 1 puff into the lungs every 6 (six) hours as needed.     amLODipine (NORVASC) 5 MG tablet Take 1 tablet (5 mg total) by mouth daily.    aspirin 81 MG chewable tablet Chew 81 mg by mouth daily as needed.  (Patient not taking: No sig reported) 01/01/2019: Takes every once in a while   chlorthalidone (HYGROTON) 25 MG tablet Take 1 tablet (25 mg total) by mouth daily.    Cholecalciferol 25 MCG (1000 UT) capsule Vitamin D3 25 mcg (1,000 unit) capsule  1 po qd    fluticasone (FLONASE) 50 MCG/ACT nasal spray Place 1 spray into both nostrils as needed.     Fluticasone-Umeclidin-Vilant (TRELEGY ELLIPTA) 100-62.5-25 MCG/INH AEPB Inhale 1 puff into the lungs daily. (Patient not taking: No sig reported)    gabapentin (NEURONTIN) 400 MG capsule TAKE 1 CAPSULE(400 MG) BY MOUTH TWICE DAILY    losartan (COZAAR) 100 MG tablet Take 1 tablet (100 mg total) by mouth daily.    meclizine (ANTIVERT) 25 MG tablet Take 0.5 tablets (12.5 mg total) by mouth every 8 (eight) hours as needed for dizziness.    Multiple Vitamins-Minerals (MULTIVITAMIN WOMEN 50+) TABS Take 1 tablet by mouth daily.     NEXIUM 20 MG capsule Take 1 capsule (20 mg total) by mouth daily.    potassium chloride (MICRO-K) 10 MEQ CR capsule Take 1 capsule (10  mEq total) by mouth daily. May open capsule and sprinkle over soft food or swallow whole    Spacer/Aero-Holding Chambers (AEROCHAMBER PLUS) inhaler Use as instructed    [DISCONTINUED] fexofenadine (ALLEGRA ALLERGY) 180 MG tablet Take 180 mg by mouth as needed. Reported on 09/29/2015    No facility-administered encounter medications on file as of 03/01/2021.    Patient Active Problem List   Diagnosis Date Noted   Obesity (BMI 35.0-39.9 without comorbidity) 01/09/2019   Peripheral polyneuropathy 12/27/2017    Vestibular disorder 03/20/2017   CKD (chronic kidney disease), stage II 12/09/2016   Chronic venous insufficiency 11/25/2016   Bilateral lower extremity edema 10/27/2016   Pre-diabetes 08/12/2016   Carotid bruit 02/09/2016   History of left breast cancer 02/09/2016   Long term current use of opiate analgesic 02/08/2016   Long term prescription opiate use 02/08/2016   Drug level above therapeutic range 02/08/2016   Chronic low back pain (Location of Tertiary source of pain) (Bilateral) (R>L) 02/08/2016   Chronic knee pain (Location of Secondary source of pain) (Bilateral) (L>R) 02/08/2016   History bilateral shoulder replacement 02/08/2016   Chronic hip pain (Location of Primary Source of Pain) (Bilateral) (R>L) 01/07/2016   Complete rupture of rotator cuff 01/07/2016   Hyperlipidemia 01/07/2016   Osteopenia 01/07/2016   Centrilobular emphysema ([place]) 07/21/2015   DDD (degenerative disc disease), lumbar 03/05/2015   Lumbar facet syndrome (Bilateral) (R>L) 03/05/2015   Sacroiliac joint dysfunction 03/05/2015   Osteoarthritis of hip (Bilateral) (R>L) 03/05/2015   DJD of shoulder 03/05/2015   Abnormal finding on mammography 03/03/2015   Back pain with radiation 03/03/2015   Major depression in full remission ([place]) 03/03/2015   Dry skin 03/03/2015   Hypokalemia 03/03/2015   Drug-induced myopathy 03/03/2015   Gastro-esophageal reflux disease without esophagitis 03/03/2015   Allergic rhinitis, seasonal 03/03/2015   Osteoarthritis of multiple joints 03/03/2015   Benign hypertension with CKD (chronic kidney disease), stage II 11/28/2014   Chronic pain 11/28/2014   Vitamin D deficiency 11/28/2014   Airway hyperreactivity 09/18/2013   Microscopic hematuria 12/20/2011    Conditions to be addressed/monitored:HTN, HLD, COPD, and Depression  Care Plan : RNCM: COPD (Adult)  Updates made by Vanita Ingles, RN since 03/01/2021 12:00 AM  Completed 03/01/2021   Problem: RNCM: Psychological  Adjustment to Diagnosis (COPD) Resolved 03/01/2021  Priority: Medium     Long-Range Goal: RNCM: COPD management Completed 03/01/2021  Start Date: 06/04/2020  Expected End Date: 08/22/2021  Recent Progress: On track  Priority: Medium  Note:   Current Barriers: Resolving, duplicate goal  Knowledge deficits related to basic understanding of COPD disease process Knowledge deficits related to basic COPD self care/management Knowledge deficit related to basic understanding of how to use inhalers and how inhaled medications work Knowledge deficit related to importance of energy conservation Limited Social Support Lacks social connections Does not contact provider office for questions/concerns  Case Manager Clinical Goal(s):  patient will report using inhalers as prescribed including rinsing mouth after use  patient will report utilizing pursed lip breathing for shortness of breath  patient will be able to verbalize understanding of COPD action plan and when to seek appropriate levels of medical care  patient will engage in lite exercise as tolerated to build/regain stamina and strength and reduce shortness of breath through activity tolerance  patient will verbalize basic understanding of COPD disease process and self care activities  patient will not be hospitalized for COPD exacerbation as evidenced  Interventions:  Collaboration with Nobie Putnam  J, DO regarding development and update of comprehensive plan of care as evidenced by provider attestation and co-signature Inter-disciplinary care team collaboration (see longitudinal plan of care) Provided patient with basic written and verbal COPD education on self care/management/and exacerbation prevention. 10-26-2020: The patient denies any issues today with her breathing. States that she gets easily short of breath with increased activity. The patient states that she is staying in and not getting out at all. She does not like not being  able to socialize but states that she knows she needs to stay in and not risk being sick. 12-28-2020: Review of COPD and the patient states she still gets shob when ambulating but she is staying in and does not go out at all unless she needs to do so.  Provided patient with COPD action plan and reinforced importance of daily self assessment. 12-28-2020: Is mindful of changes in her breathing. States she is no longer taking Trelegy as she thinks this was causing her to have more congestion and wheezing. She feels like she was "gurgling" a lot when taking the Trelegy and has noticed since stopping it she has not had congestion.  She has not been taking for about 1 month now. She continues to take albuterol and Mucinex when needed. Endorses taking the flu vaccine each year but states she has not received any of the COVID vaccinations. She denies any acute distress. Will have follow up with the pharm D on Friday of this week. Encouraged her to discuss medications with the pharm D. Will continue to monitor.  Provided written and verbal instructions on pursed lip breathing and utilized returned demonstration as teach back Provided instruction about proper use of medications used for management of COPD including inhalers Advised patient to self assesses COPD action plan zone and make appointment with provider if in the yellow zone for 48 hours without improvement. Provided patient with education about the role of exercise in the management of COPD. 12-28-2020: The patient is limited in her ability to exercise due to her shortness of breath and limited mobility.  Advised patient to engage in light exercise as tolerated 3-5 days a week Provided education about and advised patient to utilize infection prevention strategies to reduce risk of respiratory infection. 12-28-2020: Review of triggers that cause the patient to be at higher risk of infection. The patient states that she stays in her condo most of the time and does  not go out unless she absolutely has to do so.   Review of upcoming appointments. 02-23-2021 at 240 pm Patient Goals/Self-Care Activities:  - decision-making supported - depression screen reviewed - emotional support provided - family involvement promoted - problem-solving facilitated - relaxation techniques promoted - verbalization of feelings encouraged Follow Up Plan: Telephone follow up appointment with care management team member scheduled for: 03-01-2021 at 0900 am    Care Plan : RNCM: Hypertension (Adult)  Updates made by Vanita Ingles, RN since 03/01/2021 12:00 AM  Completed 03/01/2021   Problem: RNCM: HTN Resolved 03/01/2021  Priority: Medium     Long-Range Goal: RNCM: Hypertension Monitored Completed 03/01/2021  Start Date: 06/04/2020  Expected End Date: 08/22/2021  Recent Progress: On track  Priority: Medium  Note:   Objective: Resolving, duplicate goal  Last practice recorded BP readings:  BP Readings from Last 3 Encounters:  09/21/20 (!) 113/42  03/02/20 (!) 127/52  10/16/19 (!) 122/56   Most recent eGFR/CrCl: No results found for: EGFR  No components found for: CRCL Current Barriers:  Knowledge Deficits related to basic understanding of hypertension pathophysiology and self care management Knowledge Deficits related to understanding of medications prescribed for management of hypertension Transportation barriers Limited Social Support Unable to independently manage HTN Lacks social connections Does not contact provider office for questions/concerns Case Manager Clinical Goal(s):  patient will verbalize understanding of plan for hypertension management patient will attend all scheduled medical appointments: 02-23-2021 at 240 pm patient will demonstrate improved adherence to prescribed treatment plan for hypertension as evidenced by taking all medications as prescribed, monitoring and recording blood pressure as directed, adhering to low sodium/DASH diet patient  will demonstrate improved health management independence as evidenced by checking blood pressure as directed and notifying PCP if SBP>160 or DBP > 90, taking all medications as prescribe, and adhering to a low sodium diet as discussed. patient will verbalize basic understanding of hypertension disease process and self health management plan as evidenced by compliance with medications, compliance with heart healthy diet, and working with the CCM team to optimize health and well being.  Interventions:  Collaboration with Olin Hauser, DO regarding development and update of comprehensive plan of care as evidenced by provider attestation and co-signature Inter-disciplinary care team collaboration (see longitudinal plan of care) UNABLE to independently:manage HTN Evaluation of current treatment plan related to hypertension self management and patient's adherence to plan as established by provider. 12-28-2020: The patient has the ability to take her blood pressure but does not do so because it stresses her out. She states she doesn't like to get upset. Denies any headaches. Does has dizziness often. States it is not worse than usual. Will continue to monitor for changes.  Provided education to patient re: stroke prevention, s/s of heart attack and stroke, DASH diet, complications of uncontrolled blood pressure Reviewed medications with patient and discussed importance of compliance. 12-28-2020: States compliance with medications. Denies any acute issues at this time.  Discussed plans with patient for ongoing care management follow up and provided patient with direct contact information for care management team Advised patient, providing education and rationale, to monitor blood pressure daily and record, calling PCP for findings outside established parameters.  Reviewed scheduled/upcoming provider appointments including: 02-23-2021 at 240 pm Self-Care Activities: - Self administers medications as  prescribed Attends all scheduled provider appointments Calls provider office for new concerns, questions, or BP outside discussed parameters Checks BP and records as discussed Follows a low sodium diet/DASH diet Patient Goals: - check blood pressure weekly - choose a place to take my blood pressure (home, clinic or office, retail store) - write blood pressure results in a log or diary - agree on reward when goals are met - agree to work together to make changes - ask questions to understand - have a family meeting to talk about healthy habits - learn about high blood pressure  Follow Up Plan: Telephone follow up appointment with care management team member scheduled for: 03-01-2021 at 0900 am    Care Plan : RNCM: Depression (Adult) and Anxiety  Updates made by Vanita Ingles, RN since 03/01/2021 12:00 AM  Completed 03/01/2021   Problem: RNCM: Symptoms (Depression) and Anxiety Resolved 03/01/2021  Priority: High  Onset Date: 06/04/2020     Long-Range Goal: Symptoms Monitored and Managed Completed 03/01/2021  Start Date: 06/04/2020  Expected End Date: 10/21/2021  Recent Progress: On track  Priority: High  Note:   Current Barriers: resolving, duplicate goal  Ineffective Self Health Maintenance in a patient with Anxiety and Depression Unable to independently manage  anxiety and depression  Lacks social connections Does not contact provider office for questions/concerns Clinical Goal(s):  Collaboration with Parks Ranger, Devonne Doughty, DO regarding development and update of comprehensive plan of care as evidenced by provider attestation and co-signature Inter-disciplinary care team collaboration (see longitudinal plan of care) patient will work with care management team to address care coordination and chronic disease management needs related to Disease Management Educational Needs Care Coordination Psychosocial Support Mental Health Counseling Level of Care Concerns    Interventions:  Evaluation of current treatment plan related to Anxiety and Depression, Limited social support and Mental Health Concerns  self-management and patient's adherence to plan as established by provider. 12-28-2020: The patient gets very lonely and depressed. She states it is not worse than usual and she has a long standing history or depression. It runs in her family. She states she is just old and all her siblings have passed. She also does not have any friends alive.  She has 2 sons and a daughter in Sports coach. One of her sons is in "bad shape", likely worse than she is. She states that she does see her other son and daughter in law every Sunday and this makes her feel better. If she could afford it she would go to an assisted living but states it is expensive to do that. She knows only a couple of people in her area and she states they don't want to be around and "old woman". She says that her mama always told her getting old was not for "sissy's".  She says she will keep on keeping on until the [place] decides to take her home.  Denies any SI.  Empathetic support and listening.  Collaboration with Olin Hauser, DO regarding development and update of comprehensive plan of care as evidenced by provider attestation       and co-signature Inter-disciplinary care team collaboration (see longitudinal plan of care). 12-28-2020: Works with the [place] and LCSW for ongoing support and education. Is thankful for the collaboration of the CCM team.  Discussed plans with patient for ongoing care management follow up and provided patient with direct contact information for care management team Self Care Activities:  Patient verbalizes understanding of plan to effective management of anxiety and depression  Self administers medications as prescribed Attends all scheduled provider appointments Calls pharmacy for medication refills Attends church or other social activities Performs ADL's  independently Performs IADL's independently Calls provider office for new concerns or questions Patient Goals: - activity or exercise based on tolerance encouraged - depression screen reviewed - emotional support provided - healthy lifestyle promoted - medication side effects monitored and managed - pain managed - participation in mental health treatment encouraged - quality of sleep assessed - response to mental health treatment monitored - social activities and relationships encouraged - substance use assessed Follow Up Plan: Telephone follow up appointment with care management team member scheduled for: 03-01-2021 at 0900 am    Care Plan : RNCM: General Plan of Care (Adult) for Chronic Disease Management and Care Coordination Needs  Updates made by Vanita Ingles, RN since 03/01/2021 12:00 AM     Problem: RNCM: Developement of Plan of Care For Chronic Disease Management (HTN, HLD, Depression, COPD)   Priority: High     Long-Range Goal: RNCM: Developement of Plan of Care For Chronic Disease Management (HTN, HLD, Depression, COPD)   Start Date: 03/01/2021  Expected End Date: 03/01/2022  Priority: High  Note:   Current Barriers:  Knowledge  Deficits related to plan of care for management of HTN, HLD, COPD, and Depression: depressed mood anxiety  Chronic Disease Management support and education needs related to HTN, HLD, COPD, and Depression: depressed mood anxiety Lacks caregiver support.         RNCM Clinical Goal(s):  Patient will verbalize understanding of plan for management of HTN, HLD, COPD, and Depression  as evidenced by following the plan of care, working with the CCM team, taking medications, and compliance with dietary restricitons take all medications exactly as prescribed and will call provider for medication related questions as evidenced by compliance with medications    attend all scheduled medical appointments: 08-23-2021 as evidenced by keeping appointments and  calling the office for needed schedule changes         demonstrate a decrease in HTN, HLD, COPD, and Depression exacerbations  as evidenced by working with the CCM team to effectively manage health and well being  demonstrate ongoing self health care management ability for effective management of chronic conditions  as evidenced by working with the CCM team through collaboration with Consulting civil engineer, provider, and care team.   Interventions: 1:1 collaboration with primary care provider regarding development and update of comprehensive plan of care as evidenced by provider attestation and co-signature Inter-disciplinary care team collaboration (see longitudinal plan of care) Evaluation of current treatment plan related to  self management and patient's adherence to plan as established by provider   SDOH Barriers (Status: Goal on Track (progressing): YES.) Long Term Goal  Patient interviewed and SDOH assessment performed        Patient interviewed and appropriate assessments performed Provided patient with information about resources available and care guides to help with any changes in SDOH Discussed plans with patient for ongoing care management follow up and provided patient with direct contact information for care management team Advised patient to call the office for changes in SDOH, questions or concerns    COPD: (Status: Goal on Track (progressing): YES.) Long Term Goal  Reviewed medications with patient, including use of prescribed maintenance and rescue inhalers, and provided instruction on medication management and the importance of adherence Provided patient with basic written and verbal COPD education on self care/management/and exacerbation prevention Advised patient to track and manage COPD triggers Provided written and verbal instructions on pursed lip breathing and utilized returned demonstration as teach back Provided instruction about proper use of medications used for  management of COPD including inhalers Advised patient to self assesses COPD action plan zone and make appointment with provider if in the yellow zone for 48 hours without improvement Advised patient to engage in light exercise as tolerated 3-5 days a week to aid in the the management of COPD Provided education about and advised patient to utilize infection prevention strategies to reduce risk of respiratory infection Discussed the importance of adequate rest and management of fatigue with COPD Screening for signs and symptoms of depression related to chronic disease state  Assessed social determinant of health barriers  Depression   (Status: Goal on Track (progressing): YES.) Long Term Goal  Evaluation of current treatment plan related to HTN, HLD, COPD, and Depression, Mental Health Concerns  self-management and patient's adherence to plan as established by provider. Discussed plans with patient for ongoing care management follow up and provided patient with direct contact information for care management team Advised patient to call the office for changes in mood, anxiety of depression ; Provided education to patient re: working with the CCM team  to optimize mental health needs ; Reviewed medications with patient and discussed compliance; Reviewed scheduled/upcoming provider appointments including 08-23-2021; Discussed plans with patient for ongoing care management follow up and provided patient with direct contact information for care management team;  Hyperlipidemia:  (Status: Goal on Track (progressing): YES.) Long Term Goal  Lab Results  Component Value Date   CHOL 202 (H) 02/16/2021   HDL 50 02/16/2021   LDLCALC 116 (H) 02/16/2021   TRIG 238 (H) 02/16/2021   CHOLHDL 4.0 02/16/2021     Medication review performed; medication list updated in electronic medical record.  Provider established cholesterol goals reviewed; Counseled on importance of regular laboratory monitoring as  prescribed; Provided HLD educational materials; Reviewed role and benefits of statin for ASCVD risk reduction; Discussed strategies to manage statin-induced myalgias; Reviewed importance of limiting foods high in cholesterol;  Hypertension: (Status: Goal on Track (progressing): YES.) Last practice recorded BP readings:  BP Readings from Last 3 Encounters:  02/23/21 139/71  09/21/20 (!) 113/42  03/02/20 (!) 127/52  Most recent eGFR/CrCl:  Lab Results  Component Value Date   EGFR 71 02/16/2021    No components found for: CRCL  Evaluation of current treatment plan related to hypertension self management and patient's adherence to plan as established by provider;   Provided education to patient re: stroke prevention, s/s of heart attack and stroke; Reviewed prescribed diet heart healthy Reviewed medications with patient and discussed importance of compliance;  Discussed plans with patient for ongoing care management follow up and provided patient with direct contact information for care management team; Advised patient, providing education and rationale, to monitor blood pressure daily and record, calling PCP for findings outside established parameters;  Advised patient to discuss trends in blood pressure with provider; Provided education on prescribed diet heart healthy ;  Discussed complications of poorly controlled blood pressure such as heart disease, stroke, circulatory complications, vision complications, kidney impairment, sexual dysfunction;   Patient Goals/Self-Care Activities: Patient will self administer medications as prescribed as evidenced by self report/primary caregiver report  Patient will attend all scheduled provider appointments as evidenced by clinician review of documented attendance to scheduled appointments and patient/caregiver report Patient will call pharmacy for medication refills as evidenced by patient report and review of pharmacy fill history as  appropriate Patient will attend church or other social activities as evidenced by patient report Patient will continue to perform ADL's independently as evidenced by patient/caregiver report Patient will continue to perform IADL's independently as evidenced by patient/caregiver report Patient will call provider office for new concerns or questions as evidenced by review of documented incoming telephone call notes and patient report Patient will work with BSW to address care coordination needs and will continue to work with the clinical team to address health care and disease management related needs as evidenced by documented adherence to scheduled care management/care coordination appointments - eliminate smoking in my home - identify and remove indoor air pollutants - limit outdoor activity during cold weather - listen for public air quality announcements every day - do breathing exercises every day - arrange respite care for caregiver - develop a rescue plan - eliminate symptom triggers at home - follow rescue plan if symptoms flare-up - use an extra pillow to sleep - develop a new routine to improve sleep - don't eat or exercise right before bedtime - eat healthy/prescribed diet: heart healthy diet  - get at least 7 to 8 hours of sleep at night - use devices that will help  like a cane, sock-puller or reacher - practice relaxation or meditation daily - do exercises in a comfortable position that makes breathing as easy as possible - check blood pressure weekly - choose a place to take my blood pressure (home, clinic or office, retail store) - write blood pressure results in a log or diary - learn about high blood pressure - keep a blood pressure log - take blood pressure log to all doctor appointments - call doctor for signs and symptoms of high blood pressure - develop an action plan for high blood pressure - keep all doctor appointments - take medications for blood pressure  exactly as prescribed - report new symptoms to your doctor - eat more whole grains, fruits and vegetables, lean meats and healthy fats - call for medicine refill 2 or 3 days before it runs out - take all medications exactly as prescribed - call doctor with any symptoms you believe are related to your medicine - call doctor when you experience any new symptoms - go to all doctor appointments as scheduled - adhere to prescribed diet: heart healthy       Plan:Telephone follow up appointment with care management team member scheduled for:  03-22-2021 at 0900 am  Noreene Larsson RN, MSN, [place] Mobile: [phone]

## 2021-03-17 ENCOUNTER — Ambulatory Visit: Payer: Medicare Other | Admitting: Licensed Clinical Social Worker

## 2021-03-17 DIAGNOSIS — I129 Hypertensive chronic kidney disease with stage 1 through stage 4 chronic kidney disease, or unspecified chronic kidney disease: Secondary | ICD-10-CM

## 2021-03-17 DIAGNOSIS — F3342 Major depressive disorder, recurrent, in full remission: Secondary | ICD-10-CM

## 2021-03-17 DIAGNOSIS — E782 Mixed hyperlipidemia: Secondary | ICD-10-CM | POA: Diagnosis not present

## 2021-03-17 DIAGNOSIS — M159 Polyosteoarthritis, unspecified: Secondary | ICD-10-CM

## 2021-03-17 DIAGNOSIS — N182 Chronic kidney disease, stage 2 (mild): Secondary | ICD-10-CM

## 2021-03-17 DIAGNOSIS — G894 Chronic pain syndrome: Secondary | ICD-10-CM

## 2021-03-17 DIAGNOSIS — J449 Chronic obstructive pulmonary disease, unspecified: Secondary | ICD-10-CM

## 2021-03-17 DIAGNOSIS — J432 Centrilobular emphysema: Secondary | ICD-10-CM | POA: Diagnosis not present

## 2021-03-17 DIAGNOSIS — M5136 Other intervertebral disc degeneration, lumbar region: Secondary | ICD-10-CM

## 2021-03-19 NOTE — Patient Instructions (Signed)
Visit Information  Thank you for taking time to visit with me today. Please don't hesitate to contact me if I can be of assistance to you before our next scheduled telephone appointment.  Following are the goals we discussed today:  Patient Self Care Activities:  Self administers medications as prescribed Attend all scheduled provider appointments Contact clinic with any questions or concerns Continue utilizing healthy coping skills  Our next appointment is by telephone on 06/08/21 at 1:00 PM  Please call the care guide team at [phone] if you need to cancel or reschedule your appointment.   If you are experiencing a Mental Health or [place] or need someone to talk to, please call the Suicide and Crisis Lifeline: 988 call 911   Patient verbalizes understanding of instructions provided today   Christa See, MSW, [email or web address] Phone [phone] 8:03 AM

## 2021-03-19 NOTE — Chronic Care Management (AMB) (Signed)
Chronic Care Management    Clinical Social Work Note  03/19/2021 Name: Candace Tapia MRN: [id number] DOB: 1935/09/13  Candace Tapia is a 85 y.o. year old female who is a primary care patient of Olin Hauser, DO. The CCM team was consulted to assist the patient with chronic disease management and/or care coordination needs related to: Mental Health Counseling and Resources.   Engaged with patient by telephone for follow up visit in response to provider referral for social work chronic care management and care coordination services.   Consent to Services:  The patient was given information about Chronic Care Management services, agreed to services, and gave verbal consent prior to initiation of services.  Please see initial visit note for detailed documentation.   Patient agreed to services and consent obtained.   Consent to Services:  The patient was given information about Care Management services, agreed to services, and gave verbal consent prior to initiation of services.  Please see initial visit note for detailed documentation.   Patient agreed to services today and consent obtained.  Engaged with patient by phone in response to provider referral for social work care coordination services:  Assessment/Interventions:  Patient continues to maintain positive progress with care plan goals. Currently, patient is not interested in assisted living to promote socialization. Healthy strategies to assist with management of mental health identified.  See Care Plan below for interventions and patient self-care activities.  Recent life changes or stressors: Management of health conditions  Recommendation: Patient may benefit from, and is in agreement work with LCSW to address care coordination needs and will continue to work with the clinical team to address health care and disease management related needs.   Follow up Plan: Patient would like continued follow-up from CCM LCSW.   per patient's request will follow up in 06/08/21.  Will call office if needed prior to next encounter.    SDOH (Social Determinants of Health) assessments and interventions performed:    Advanced Directives Status: Not addressed in this encounter.  CCM Care Plan  Allergies  Allergen Reactions   Tramadol Hcl Itching    Per pt it caused me to itch all over   Statins     Not sure what exact med was; just made her feel bad (can't elaborate)   Penicillin G Rash   Penicillins Rash    Has patient had a PCN reaction causing immediate rash, facial/tongue/throat swelling, SOB or lightheadedness with hypotension: No Has patient had a PCN reaction causing severe rash involving mucus membranes or skin necrosis: No Has patient had a PCN reaction that required hospitalization: No Has patient had a PCN reaction occurring within the last 10 years: No If all of the above answers are "NO", then may proceed with Cephalosporin use.   Sulfa Antibiotics Other (See Comments) and Rash    Outpatient Encounter Medications as of 03/17/2021  Medication Sig Note   acetaminophen (TYLENOL) 650 MG CR tablet Take 650 mg by mouth every 8 (eight) hours as needed for pain.    albuterol (VENTOLIN HFA) 108 (90 Base) MCG/ACT inhaler Inhale 1 puff into the lungs every 6 (six) hours as needed.     amLODipine (NORVASC) 5 MG tablet Take 1 tablet (5 mg total) by mouth daily.    aspirin 81 MG chewable tablet Chew 81 mg by mouth daily as needed.  (Patient not taking: No sig reported) 01/01/2019: Takes every once in a while   chlorthalidone (HYGROTON) 25 MG tablet Take 1 tablet (  25 mg total) by mouth daily.    Cholecalciferol 25 MCG (1000 UT) capsule Vitamin D3 25 mcg (1,000 unit) capsule  1 po qd    fluticasone (FLONASE) 50 MCG/ACT nasal spray Place 1 spray into both nostrils as needed.     Fluticasone-Umeclidin-Vilant (TRELEGY ELLIPTA) 100-62.5-25 MCG/INH AEPB Inhale 1 puff into the lungs daily. (Patient not taking: No sig  reported)    gabapentin (NEURONTIN) 400 MG capsule TAKE 1 CAPSULE(400 MG) BY MOUTH TWICE DAILY    losartan (COZAAR) 100 MG tablet Take 1 tablet (100 mg total) by mouth daily.    meclizine (ANTIVERT) 25 MG tablet Take 0.5 tablets (12.5 mg total) by mouth every 8 (eight) hours as needed for dizziness.    Multiple Vitamins-Minerals (MULTIVITAMIN WOMEN 50+) TABS Take 1 tablet by mouth daily.     NEXIUM 20 MG capsule Take 1 capsule (20 mg total) by mouth daily.    potassium chloride (MICRO-K) 10 MEQ CR capsule Take 1 capsule (10 mEq total) by mouth daily. May open capsule and sprinkle over soft food or swallow whole    Spacer/Aero-Holding Chambers (AEROCHAMBER PLUS) inhaler Use as instructed    [DISCONTINUED] fexofenadine (ALLEGRA ALLERGY) 180 MG tablet Take 180 mg by mouth as needed. Reported on 09/29/2015    No facility-administered encounter medications on file as of 03/17/2021.    Patient Active Problem List   Diagnosis Date Noted   Obesity (BMI 35.0-39.9 without comorbidity) 01/09/2019   Peripheral polyneuropathy 12/27/2017   Vestibular disorder 03/20/2017   CKD (chronic kidney disease), stage II 12/09/2016   Chronic venous insufficiency 11/25/2016   Bilateral lower extremity edema 10/27/2016   Pre-diabetes 08/12/2016   Carotid bruit 02/09/2016   History of left breast cancer 02/09/2016   Long term current use of opiate analgesic 02/08/2016   Long term prescription opiate use 02/08/2016   Drug level above therapeutic range 02/08/2016   Chronic low back pain (Location of Tertiary source of pain) (Bilateral) (R>L) 02/08/2016   Chronic knee pain (Location of Secondary source of pain) (Bilateral) (L>R) 02/08/2016   History bilateral shoulder replacement 02/08/2016   Chronic hip pain (Location of Primary Source of Pain) (Bilateral) (R>L) 01/07/2016   Complete rupture of rotator cuff 01/07/2016   Hyperlipidemia 01/07/2016   Osteopenia 01/07/2016   Centrilobular emphysema ([place]) 07/21/2015    DDD (degenerative disc disease), lumbar 03/05/2015   Lumbar facet syndrome (Bilateral) (R>L) 03/05/2015   Sacroiliac joint dysfunction 03/05/2015   Osteoarthritis of hip (Bilateral) (R>L) 03/05/2015   DJD of shoulder 03/05/2015   Abnormal finding on mammography 03/03/2015   Back pain with radiation 03/03/2015   Major depression in full remission ([place]) 03/03/2015   Dry skin 03/03/2015   Hypokalemia 03/03/2015   Drug-induced myopathy 03/03/2015   Gastro-esophageal reflux disease without esophagitis 03/03/2015   Allergic rhinitis, seasonal 03/03/2015   Osteoarthritis of multiple joints 03/03/2015   Benign hypertension with CKD (chronic kidney disease), stage II 11/28/2014   Chronic pain 11/28/2014   Vitamin D deficiency 11/28/2014   Airway hyperreactivity 09/18/2013   Microscopic hematuria 12/20/2011    Conditions to be addressed/monitored: Anxiety and Depression; Limited social support  Care Plan : General Social Work (Adult)  Updates made by Rebekah Chesterfield, LCSW since 03/19/2021 12:00 AM     Problem: Quality of Life (General Plan of Care)      Long-Range Goal: Quality of Life Maintained   Start Date: 07/08/2020  This Visit's Progress: On track  Recent Progress: On track  Priority: Medium  Note:   Timeframe:  Long-Range Goal Priority:  Medium Start Date 3/23/222                        Expected End Date: 06/15/21            Follow Up Date- 06/08/21   Current Barriers:  Financial constraints related to affording in home support or eventual long term care placement Limited social support Level of care concerns ADL IADL limitations Limited access to caregiver Inability to perform ADL's independently Inability to perform IADL's independently Ongoing depression Clinical Social Work Clinical Goal(s):  Over the next 120 days, patient will work with SW to address concerns related to increasing depression and anxiety management and gaining additional support within the  home  Interventions: Assessed patient's previous and current treatment, coping skills, support system and barriers to care Patient interviewed and appropriate assessments performed Patient reports that she is managing symptoms of depression well. States, "I'm doing the best i can" 11/30: Patient reports depression symptoms have remained the same, denies increase in symptoms. Patient denies SI/HI Patient endorses increase in fatigue due to waking up throughout the night. She also experiences pain in her hips and back from arthritis 10/19: Patient reports continued difficulty getting around due to SOB. She states, "I'm getting by" Patient is compliant with pain meds, stating "they work well" Patient continues to utilize a walker to promote safety. Patient denies any recent falls. Patient utilizes Med Alert to assist with promoting safety in the event of a fall 11/30: Patient denies any falls Strategies to increase socialization discussed. Patient continues to receive strong support from family, who visit routinely. CCM LCSW informed patient that independent living facilities do not accept insurance and provides ample opportunity to socialize with peers. Patient was very interested in obtaining additional information and CCM LCSW agreed to send her information on [facility] 11/30 Patient reports she is not interested in independent living facilities at this time. May look into it in the future Patient was successful in identifying healthy coping skills to assist with management of symptoms. Patient enjoys listening to music to assist with management of symptoms. She is aware of the variety in audiobooks, as well 11/30 Patient enjoys singing to promote mood and relaxation Patient endorses long hx of depression and anxiety symptoms triggered by grief of loved ones and chronic medical conditions. Patient reports that she has difficulty with hearing over the phone and her eyesight has worsened; however, feels  depression/anxiety symptoms are managed at this time Patient reports that her daughter in law continues to get her groceries for her and provides transportation for her medical appointments. She has two sons that assist with her care. She reports that her family visits her usually once per week on Sundays Upcoming appointments reviewed. Her Daughter manages patient's appointments and provides transportation Solution-Focused Strategies, Active listening / Reflection utilized , Emotional Supportive Provided, Verbalization of feelings encouraged , and Suicidal Ideation/Homicidal Ideation assessed: ; Discussed plans with patient for ongoing care management follow up and provided patient with direct contact information for care management team Collaboration with PCP regarding development and update of comprehensive plan of care as evidenced by provider attestation and co-signature Inter-disciplinary care team collaboration (see longitudinal plan of care) Patient Self Care Activities:  Self administers medications as prescribed Attend all scheduled provider appointments Contact clinic with any questions or concerns Continue utilizing healthy coping skills       Christa See, MSW, [place]  [facility] Care Management [email or web address] Phone [phone] 7:59 AM

## 2021-03-22 ENCOUNTER — Telehealth: Payer: Medicare Other

## 2021-03-22 ENCOUNTER — Ambulatory Visit (INDEPENDENT_AMBULATORY_CARE_PROVIDER_SITE_OTHER): Payer: Medicare Other

## 2021-03-22 DIAGNOSIS — N182 Chronic kidney disease, stage 2 (mild): Secondary | ICD-10-CM

## 2021-03-22 DIAGNOSIS — J449 Chronic obstructive pulmonary disease, unspecified: Secondary | ICD-10-CM

## 2021-03-22 DIAGNOSIS — E782 Mixed hyperlipidemia: Secondary | ICD-10-CM

## 2021-03-22 DIAGNOSIS — F3342 Major depressive disorder, recurrent, in full remission: Secondary | ICD-10-CM

## 2021-03-22 DIAGNOSIS — J432 Centrilobular emphysema: Secondary | ICD-10-CM

## 2021-03-22 NOTE — Chronic Care Management (AMB) (Signed)
Chronic Care Management   CCM RN Visit Note  03/22/2021 Name: Candace Tapia MRN: [id number] DOB: 1935-09-03  Subjective: Candace Tapia is a 85 y.o. year old female who is a primary care patient of Olin Hauser, DO. The care management team was consulted for assistance with disease management and care coordination needs.    Engaged with patient by telephone for follow up visit in response to provider referral for case management and/or care coordination services.   Consent to Services:  The patient was given information about Chronic Care Management services, agreed to services, and gave verbal consent prior to initiation of services.  Please see initial visit note for detailed documentation.   Patient agreed to services and verbal consent obtained.   Assessment: Review of patient past medical history, allergies, medications, health status, including review of consultants reports, laboratory and other test data, was performed as part of comprehensive evaluation and provision of chronic care management services.   SDOH (Social Determinants of Health) assessments and interventions performed:    CCM Care Plan  Allergies  Allergen Reactions   Tramadol Hcl Itching    Per pt it caused me to itch all over   Statins     Not sure what exact med was; just made her feel bad (can't elaborate)   Penicillin G Rash   Penicillins Rash    Has patient had a PCN reaction causing immediate rash, facial/tongue/throat swelling, SOB or lightheadedness with hypotension: No Has patient had a PCN reaction causing severe rash involving mucus membranes or skin necrosis: No Has patient had a PCN reaction that required hospitalization: No Has patient had a PCN reaction occurring within the last 10 years: No If all of the above answers are "NO", then may proceed with Cephalosporin use.   Sulfa Antibiotics Other (See Comments) and Rash    Outpatient Encounter Medications as of 03/22/2021   Medication Sig Note   acetaminophen (TYLENOL) 650 MG CR tablet Take 650 mg by mouth every 8 (eight) hours as needed for pain.    albuterol (VENTOLIN HFA) 108 (90 Base) MCG/ACT inhaler Inhale 1 puff into the lungs every 6 (six) hours as needed.     amLODipine (NORVASC) 5 MG tablet Take 1 tablet (5 mg total) by mouth daily.    aspirin 81 MG chewable tablet Chew 81 mg by mouth daily as needed.  (Patient not taking: No sig reported) 01/01/2019: Takes every once in a while   chlorthalidone (HYGROTON) 25 MG tablet Take 1 tablet (25 mg total) by mouth daily.    Cholecalciferol 25 MCG (1000 UT) capsule Vitamin D3 25 mcg (1,000 unit) capsule  1 po qd    fluticasone (FLONASE) 50 MCG/ACT nasal spray Place 1 spray into both nostrils as needed.     Fluticasone-Umeclidin-Vilant (TRELEGY ELLIPTA) 100-62.5-25 MCG/INH AEPB Inhale 1 puff into the lungs daily. (Patient not taking: No sig reported)    gabapentin (NEURONTIN) 400 MG capsule TAKE 1 CAPSULE(400 MG) BY MOUTH TWICE DAILY    losartan (COZAAR) 100 MG tablet Take 1 tablet (100 mg total) by mouth daily.    meclizine (ANTIVERT) 25 MG tablet Take 0.5 tablets (12.5 mg total) by mouth every 8 (eight) hours as needed for dizziness.    Multiple Vitamins-Minerals (MULTIVITAMIN WOMEN 50+) TABS Take 1 tablet by mouth daily.     NEXIUM 20 MG capsule Take 1 capsule (20 mg total) by mouth daily.    potassium chloride (MICRO-K) 10 MEQ CR capsule Take 1 capsule (10  mEq total) by mouth daily. May open capsule and sprinkle over soft food or swallow whole    Spacer/Aero-Holding Chambers (AEROCHAMBER PLUS) inhaler Use as instructed    [DISCONTINUED] fexofenadine (ALLEGRA ALLERGY) 180 MG tablet Take 180 mg by mouth as needed. Reported on 09/29/2015    No facility-administered encounter medications on file as of 03/22/2021.    Patient Active Problem List   Diagnosis Date Noted   Obesity (BMI 35.0-39.9 without comorbidity) 01/09/2019   Peripheral polyneuropathy 12/27/2017    Vestibular disorder 03/20/2017   CKD (chronic kidney disease), stage II 12/09/2016   Chronic venous insufficiency 11/25/2016   Bilateral lower extremity edema 10/27/2016   Pre-diabetes 08/12/2016   Carotid bruit 02/09/2016   History of left breast cancer 02/09/2016   Long term current use of opiate analgesic 02/08/2016   Long term prescription opiate use 02/08/2016   Drug level above therapeutic range 02/08/2016   Chronic low back pain (Location of Tertiary source of pain) (Bilateral) (R>L) 02/08/2016   Chronic knee pain (Location of Secondary source of pain) (Bilateral) (L>R) 02/08/2016   History bilateral shoulder replacement 02/08/2016   Chronic hip pain (Location of Primary Source of Pain) (Bilateral) (R>L) 01/07/2016   Complete rupture of rotator cuff 01/07/2016   Hyperlipidemia 01/07/2016   Osteopenia 01/07/2016   Centrilobular emphysema ([place]) 07/21/2015   DDD (degenerative disc disease), lumbar 03/05/2015   Lumbar facet syndrome (Bilateral) (R>L) 03/05/2015   Sacroiliac joint dysfunction 03/05/2015   Osteoarthritis of hip (Bilateral) (R>L) 03/05/2015   DJD of shoulder 03/05/2015   Abnormal finding on mammography 03/03/2015   Back pain with radiation 03/03/2015   Major depression in full remission ([place]) 03/03/2015   Dry skin 03/03/2015   Hypokalemia 03/03/2015   Drug-induced myopathy 03/03/2015   Gastro-esophageal reflux disease without esophagitis 03/03/2015   Allergic rhinitis, seasonal 03/03/2015   Osteoarthritis of multiple joints 03/03/2015   Benign hypertension with CKD (chronic kidney disease), stage II 11/28/2014   Chronic pain 11/28/2014   Vitamin D deficiency 11/28/2014   Airway hyperreactivity 09/18/2013   Microscopic hematuria 12/20/2011    Conditions to be addressed/monitored:HTN, HLD, COPD, and Depression  Care Plan : RNCM: General Plan of Care (Adult) for Chronic Disease Management and Care Coordination Needs  Updates made by Vanita Ingles, RN since  03/22/2021 12:00 AM     Problem: RNCM: Developement of Plan of Care For Chronic Disease Management (HTN, HLD, Depression, COPD)   Priority: High     Long-Range Goal: RNCM: Developement of Plan of Care For Chronic Disease Management (HTN, HLD, Depression, COPD)   Start Date: 03/01/2021  Expected End Date: 03/01/2022  Priority: High  Note:   Current Barriers:  Knowledge Deficits related to plan of care for management of HTN, HLD, COPD, and Depression: depressed mood anxiety  Chronic Disease Management support and education needs related to HTN, HLD, COPD, and Depression: depressed mood anxiety Lacks caregiver support.         RNCM Clinical Goal(s):  Patient will verbalize understanding of plan for management of HTN, HLD, COPD, and Depression  as evidenced by following the plan of care, working with the CCM team, taking medications, and compliance with dietary restricitons take all medications exactly as prescribed and will call provider for medication related questions as evidenced by compliance with medications    attend all scheduled medical appointments: 08-23-2021 as evidenced by keeping appointments and calling the office for needed schedule changes         demonstrate a decrease in HTN,  HLD, COPD, and Depression exacerbations  as evidenced by working with the CCM team to effectively manage health and well being  demonstrate ongoing self health care management ability for effective management of chronic conditions  as evidenced by working with the CCM team through collaboration with Consulting civil engineer, provider, and care team.   Interventions: 1:1 collaboration with primary care provider regarding development and update of comprehensive plan of care as evidenced by provider attestation and co-signature Inter-disciplinary care team collaboration (see longitudinal plan of care) Evaluation of current treatment plan related to  self management and patient's adherence to plan as established by  provider   SDOH Barriers (Status: Goal on Track (progressing): YES.) Long Term Goal  Patient interviewed and SDOH assessment performed        Patient interviewed and appropriate assessments performed Provided patient with information about resources available and care guides to help with any changes in SDOH Discussed plans with patient for ongoing care management follow up and provided patient with direct contact information for care management team Advised patient to call the office for changes in SDOH, questions or concerns    COPD: (Status: Goal on Track (progressing): YES.) Long Term Goal  Reviewed medications with patient, including use of prescribed maintenance and rescue inhalers, and provided instruction on medication management and the importance of adherence. 03-22-2021: The patient states that she is taking her medications as prescribed. Is using her inhalers when needed and has no concerns with medications at this time. Has upcoming appointment with pharmacy next week.  Provided patient with basic written and verbal COPD education on self care/management/and exacerbation prevention Advised patient to track and manage COPD triggers. 03-22-2021: The patient states she is stable. She stays in her apartment most of the time as she gets really "give out" when going out and walking, she just can't do that like she use to do it. She knows things that trigger her COPD. COPD is stable at this time.  Provided written and verbal instructions on pursed lip breathing and utilized returned demonstration as teach back Provided instruction about proper use of medications used for management of COPD including inhalers. 03-22-2021: Review and the patient has her inhalers and knows how to use effectively.  Advised patient to self assesses COPD action plan zone and make appointment with provider if in the yellow zone for 48 hours without improvement Advised patient to engage in light exercise as tolerated  3-5 days a week to aid in the the management of COPD Provided education about and advised patient to utilize infection prevention strategies to reduce risk of respiratory infection. 03-22-2021: The patient is aware of triggers that cause exacerbation of COPD. Is mindful of risk of infection and monitors who comes to visit her.  Discussed the importance of adequate rest and management of fatigue with COPD Screening for signs and symptoms of depression related to chronic disease state  Assessed social determinant of health barriers  Depression   (Status: Goal on Track (progressing): YES.) Long Term Goal  Evaluation of current treatment plan related to Depression and social isolation  , Mental Health Concerns  self-management and patient's adherence to plan as established by provider. Discussed plans with patient for ongoing care management follow up and provided patient with direct contact information for care management team Advised patient to call the office for changes in mood, anxiety of depression ; Provided education to patient re: working with the CCM team to optimize mental health needs ; Reviewed medications with patient and  discussed compliance; Reviewed scheduled/upcoming provider appointments including 08-23-2021; Discussed plans with patient for ongoing care management follow up and provided patient with direct contact information for care management team; Evaluation of depression: 03-22-2021: The patient was having a good day today. She states that she has always been a "loner" and for the most part she deals with this very well. She states that sometimes she gets really down and depressed about things. The patient had a nice Thanksgiving and will be spending Christmas with her son and his wife. She states she does not go out much because of her chronic conditions and overall health. Knows resources are available.  She has good rapport with the CCM team and enjoys talking to them on a regular  basis. She says this helps her a lot.   Hyperlipidemia:  (Status: Goal on Track (progressing): YES.) Long Term Goal  Lab Results  Component Value Date   CHOL 202 (H) 02/16/2021   HDL 50 02/16/2021   LDLCALC 116 (H) 02/16/2021   TRIG 238 (H) 02/16/2021   CHOLHDL 4.0 02/16/2021     Medication review performed; medication list updated in electronic medical record.  Provider established cholesterol goals reviewed; Counseled on importance of regular laboratory monitoring as prescribed; Provided HLD educational materials; Reviewed role and benefits of statin for ASCVD risk reduction; Discussed strategies to manage statin-induced myalgias; Reviewed importance of limiting foods high in cholesterol. 03-22-2021: Review of heart healthy diet  Hypertension: (Status: Goal on Track (progressing): YES.) Last practice recorded BP readings:  BP Readings from Last 3 Encounters:  02/23/21 139/71  09/21/20 (!) 113/42  03/02/20 (!) 127/52  Most recent eGFR/CrCl:  Lab Results  Component Value Date   EGFR 71 02/16/2021    No components found for: CRCL  Evaluation of current treatment plan related to hypertension self management and patient's adherence to plan as established by provider;   Provided education to patient re: stroke prevention, s/s of heart attack and stroke; Reviewed prescribed diet heart healthy Reviewed medications with patient and discussed importance of compliance;  Discussed plans with patient for ongoing care management follow up and provided patient with direct contact information for care management team; Advised patient, providing education and rationale, to monitor blood pressure daily and record, calling PCP for findings outside established parameters. 03-22-2021: The patient had not taken her blood pressure this am. The patient states that she does not take it everyday but it has been good. She says she takes 3 different medications for blood pressure control. She denies any  orthostatic hypotension but review of orthostatic hypotension and changing position slowly to prevent falls or injuries.  Advised patient to discuss trends in blood pressure with provider; Provided education on prescribed diet heart healthy ;  Discussed complications of poorly controlled blood pressure such as heart disease, stroke, circulatory complications, vision complications, kidney impairment, sexual dysfunction;   Patient Goals/Self-Care Activities: Patient will self administer medications as prescribed as evidenced by self report/primary caregiver report  Patient will attend all scheduled provider appointments as evidenced by clinician review of documented attendance to scheduled appointments and patient/caregiver report Patient will call pharmacy for medication refills as evidenced by patient report and review of pharmacy fill history as appropriate Patient will attend church or other social activities as evidenced by patient report Patient will continue to perform ADL's independently as evidenced by patient/caregiver report Patient will continue to perform IADL's independently as evidenced by patient/caregiver report Patient will call provider office for new concerns or questions as evidenced  by review of documented incoming telephone call notes and patient report Patient will work with BSW to address care coordination needs and will continue to work with the clinical team to address health care and disease management related needs as evidenced by documented adherence to scheduled care management/care coordination appointments - eliminate smoking in my home - identify and remove indoor air pollutants - limit outdoor activity during cold weather - listen for public air quality announcements every day - do breathing exercises every day - arrange respite care for caregiver - develop a rescue plan - eliminate symptom triggers at home - follow rescue plan if symptoms flare-up - use an  extra pillow to sleep - develop a new routine to improve sleep - don't eat or exercise right before bedtime - eat healthy/prescribed diet: heart healthy diet  - get at least 7 to 8 hours of sleep at night - use devices that will help like a cane, sock-puller or reacher - practice relaxation or meditation daily - do exercises in a comfortable position that makes breathing as easy as possible - check blood pressure weekly - choose a place to take my blood pressure (home, clinic or office, retail store) - write blood pressure results in a log or diary - learn about high blood pressure - keep a blood pressure log - take blood pressure log to all doctor appointments - call doctor for signs and symptoms of high blood pressure - develop an action plan for high blood pressure - keep all doctor appointments - take medications for blood pressure exactly as prescribed - report new symptoms to your doctor - eat more whole grains, fruits and vegetables, lean meats and healthy fats - call for medicine refill 2 or 3 days before it runs out - take all medications exactly as prescribed - call doctor with any symptoms you believe are related to your medicine - call doctor when you experience any new symptoms - go to all doctor appointments as scheduled - adhere to prescribed diet: heart healthy       Plan:Telephone follow up appointment with care management team member scheduled for:  05-24-2021 at 0900 am  Noreene Larsson RN, MSN, [place] Mobile: [phone]

## 2021-03-22 NOTE — Patient Instructions (Signed)
Visit Information  Thank you for taking time to visit with me today. Please don't hesitate to contact me if I can be of assistance to you before our next scheduled telephone appointment.  Following are the goals we discussed today:  RNCM Clinical Goal(s):  Patient will verbalize understanding of plan for management of HTN, HLD, COPD, and Depression  as evidenced by following the plan of care, working with the CCM team, taking medications, and compliance with dietary restricitons take all medications exactly as prescribed and will call provider for medication related questions as evidenced by compliance with medications    attend all scheduled medical appointments: 08-23-2021 as evidenced by keeping appointments and calling the office for needed schedule changes         demonstrate a decrease in HTN, HLD, COPD, and Depression exacerbations  as evidenced by working with the CCM team to effectively manage health and well being  demonstrate ongoing self health care management ability for effective management of chronic conditions  as evidenced by working with the CCM team through collaboration with Consulting civil engineer, provider, and care team.    Interventions: 1:1 collaboration with primary care provider regarding development and update of comprehensive plan of care as evidenced by provider attestation and co-signature Inter-disciplinary care team collaboration (see longitudinal plan of care) Evaluation of current treatment plan related to  self management and patient's adherence to plan as established by provider     SDOH Barriers (Status: Goal on Track (progressing): YES.) Long Term Goal  Patient interviewed and SDOH assessment performed        Patient interviewed and appropriate assessments performed Provided patient with information about resources available and care guides to help with any changes in SDOH Discussed plans with patient for ongoing care management follow up and provided patient with  direct contact information for care management team Advised patient to call the office for changes in SDOH, questions or concerns       COPD: (Status: Goal on Track (progressing): YES.) Long Term Goal  Reviewed medications with patient, including use of prescribed maintenance and rescue inhalers, and provided instruction on medication management and the importance of adherence. 03-22-2021: The patient states that she is taking her medications as prescribed. Is using her inhalers when needed and has no concerns with medications at this time. Has upcoming appointment with pharmacy next week.  Provided patient with basic written and verbal COPD education on self care/management/and exacerbation prevention Advised patient to track and manage COPD triggers. 03-22-2021: The patient states she is stable. She stays in her apartment most of the time as she gets really "give out" when going out and walking, she just can't do that like she use to do it. She knows things that trigger her COPD. COPD is stable at this time.  Provided written and verbal instructions on pursed lip breathing and utilized returned demonstration as teach back Provided instruction about proper use of medications used for management of COPD including inhalers. 03-22-2021: Review and the patient has her inhalers and knows how to use effectively.  Advised patient to self assesses COPD action plan zone and make appointment with provider if in the yellow zone for 48 hours without improvement Advised patient to engage in light exercise as tolerated 3-5 days a week to aid in the the management of COPD Provided education about and advised patient to utilize infection prevention strategies to reduce risk of respiratory infection. 03-22-2021: The patient is aware of triggers that cause exacerbation of COPD. Is  mindful of risk of infection and monitors who comes to visit her.  Discussed the importance of adequate rest and management of fatigue with  COPD Screening for signs and symptoms of depression related to chronic disease state  Assessed social determinant of health barriers   Depression   (Status: Goal on Track (progressing): YES.) Long Term Goal  Evaluation of current treatment plan related to Depression and social isolation  , Mental Health Concerns  self-management and patient's adherence to plan as established by provider. Discussed plans with patient for ongoing care management follow up and provided patient with direct contact information for care management team Advised patient to call the office for changes in mood, anxiety of depression ; Provided education to patient re: working with the CCM team to optimize mental health needs ; Reviewed medications with patient and discussed compliance; Reviewed scheduled/upcoming provider appointments including 08-23-2021; Discussed plans with patient for ongoing care management follow up and provided patient with direct contact information for care management team; Evaluation of depression: 03-22-2021: The patient was having a good day today. She states that she has always been a "loner" and for the most part she deals with this very well. She states that sometimes she gets really down and depressed about things. The patient had a nice Thanksgiving and will be spending Christmas with her son and his wife. She states she does not go out much because of her chronic conditions and overall health. Knows resources are available.  She has good rapport with the CCM team and enjoys talking to them on a regular basis. She says this helps her a lot.    Hyperlipidemia:  (Status: Goal on Track (progressing): YES.) Long Term Goal       Lab Results  Component Value Date    CHOL 202 (H) 02/16/2021    HDL 50 02/16/2021    LDLCALC 116 (H) 02/16/2021    TRIG 238 (H) 02/16/2021    CHOLHDL 4.0 02/16/2021      Medication review performed; medication list updated in electronic medical record.  Provider  established cholesterol goals reviewed; Counseled on importance of regular laboratory monitoring as prescribed; Provided HLD educational materials; Reviewed role and benefits of statin for ASCVD risk reduction; Discussed strategies to manage statin-induced myalgias; Reviewed importance of limiting foods high in cholesterol. 03-22-2021: Review of heart healthy diet   Hypertension: (Status: Goal on Track (progressing): YES.) Last practice recorded BP readings:     BP Readings from Last 3 Encounters:  02/23/21 139/71  09/21/20 (!) 113/42  03/02/20 (!) 127/52  Most recent eGFR/CrCl:       Lab Results  Component Value Date    EGFR 71 02/16/2021    No components found for: CRCL   Evaluation of current treatment plan related to hypertension self management and patient's adherence to plan as established by provider;   Provided education to patient re: stroke prevention, s/s of heart attack and stroke; Reviewed prescribed diet heart healthy Reviewed medications with patient and discussed importance of compliance;  Discussed plans with patient for ongoing care management follow up and provided patient with direct contact information for care management team; Advised patient, providing education and rationale, to monitor blood pressure daily and record, calling PCP for findings outside established parameters. 03-22-2021: The patient had not taken her blood pressure this am. The patient states that she does not take it everyday but it has been good. She says she takes 3 different medications for blood pressure control. She denies any orthostatic  hypotension but review of orthostatic hypotension and changing position slowly to prevent falls or injuries.  Advised patient to discuss trends in blood pressure with provider; Provided education on prescribed diet heart healthy ;  Discussed complications of poorly controlled blood pressure such as heart disease, stroke, circulatory complications, vision  complications, kidney impairment, sexual dysfunction;    Patient Goals/Self-Care Activities: Patient will self administer medications as prescribed as evidenced by self report/primary caregiver report  Patient will attend all scheduled provider appointments as evidenced by clinician review of documented attendance to scheduled appointments and patient/caregiver report Patient will call pharmacy for medication refills as evidenced by patient report and review of pharmacy fill history as appropriate Patient will attend church or other social activities as evidenced by patient report Patient will continue to perform ADL's independently as evidenced by patient/caregiver report Patient will continue to perform IADL's independently as evidenced by patient/caregiver report Patient will call provider office for new concerns or questions as evidenced by review of documented incoming telephone call notes and patient report Patient will work with BSW to address care coordination needs and will continue to work with the clinical team to address health care and disease management related needs as evidenced by documented adherence to scheduled care management/care coordination appointments - eliminate smoking in my home - identify and remove indoor air pollutants - limit outdoor activity during cold weather - listen for public air quality announcements every day - do breathing exercises every day - arrange respite care for caregiver - develop a rescue plan - eliminate symptom triggers at home - follow rescue plan if symptoms flare-up - use an extra pillow to sleep - develop a new routine to improve sleep - don't eat or exercise right before bedtime - eat healthy/prescribed diet: heart healthy diet  - get at least 7 to 8 hours of sleep at night - use devices that will help like a cane, sock-puller or reacher - practice relaxation or meditation daily - do exercises in a comfortable position that makes  breathing as easy as possible - check blood pressure weekly - choose a place to take my blood pressure (home, clinic or office, retail store) - write blood pressure results in a log or diary - learn about high blood pressure - keep a blood pressure log - take blood pressure log to all doctor appointments - call doctor for signs and symptoms of high blood pressure - develop an action plan for high blood pressure - keep all doctor appointments - take medications for blood pressure exactly as prescribed - report new symptoms to your doctor - eat more whole grains, fruits and vegetables, lean meats and healthy fats - call for medicine refill 2 or 3 days before it runs out - take all medications exactly as prescribed - call doctor with any symptoms you believe are related to your medicine - call doctor when you experience any new symptoms - go to all doctor appointments as scheduled - adhere to prescribed diet: heart healthy    Our next appointment is by telephone on 05-24-2021 at 0900 am  Please call the care guide team at [phone] if you need to cancel or reschedule your appointment.   If you are experiencing a Mental Health or Behavioral Health Crisis or need someone to talk to, please call the Suicide and Crisis Lifeline: 988 call the [place] National Suicide Prevention Lifeline: 959 498 3067 or TTY: (613)231-9048 TTY 480 780 8810) to talk to a trained counselor call 1-800-273-TALK (toll free, 24 hour hotline)  The patient verbalized understanding of instructions, educational materials, and care plan provided today and declined offer to receive copy of patient instructions, educational materials, and care plan.   Noreene Larsson RN, MSN, [place] Mobile: [phone]

## 2021-03-31 ENCOUNTER — Telehealth: Payer: Medicare Other

## 2021-04-17 DIAGNOSIS — E782 Mixed hyperlipidemia: Secondary | ICD-10-CM | POA: Diagnosis not present

## 2021-04-17 DIAGNOSIS — F3342 Major depressive disorder, recurrent, in full remission: Secondary | ICD-10-CM

## 2021-04-17 DIAGNOSIS — J449 Chronic obstructive pulmonary disease, unspecified: Secondary | ICD-10-CM | POA: Diagnosis not present

## 2021-04-17 DIAGNOSIS — N182 Chronic kidney disease, stage 2 (mild): Secondary | ICD-10-CM

## 2021-04-17 DIAGNOSIS — J432 Centrilobular emphysema: Secondary | ICD-10-CM | POA: Diagnosis not present

## 2021-04-17 DIAGNOSIS — I129 Hypertensive chronic kidney disease with stage 1 through stage 4 chronic kidney disease, or unspecified chronic kidney disease: Secondary | ICD-10-CM

## 2021-04-19 ENCOUNTER — Other Ambulatory Visit: Payer: Self-pay | Admitting: Family Medicine

## 2021-04-19 DIAGNOSIS — E876 Hypokalemia: Secondary | ICD-10-CM

## 2021-04-20 NOTE — Telephone Encounter (Signed)
Requested Prescriptions  Pending Prescriptions Disp Refills   potassium chloride (MICRO-K) 10 MEQ CR capsule [Pharmacy Med Name: POTASSIUM CL 10MEQ ER CAPSULES] 90 capsule 3    Sig: TAKE 1 CAPSULE(10 MEQ) BY MOUTH DAILY. MAY OPEN CAPSULE AND SPRINKLE OVER SOFT FOOD OR. [place] WHOLE     Endocrinology:  Minerals - Potassium Supplementation Passed - 04/19/2021  9:32 AM      Passed - K in normal range and within 360 days    Potassium  Date Value Ref Range Status  02/16/2021 4.1 3.5 - 5.3 mmol/L Final  04/28/2014 4.1 3.5 - 5.1 mmol/L Final         Passed - Cr in normal range and within 360 days    Creat  Date Value Ref Range Status  02/16/2021 0.82 0.60 - 0.95 mg/dL Final         Passed - Valid encounter within last 12 months    Recent Outpatient Visits          1 month ago Annual physical exam   [facility] Olin Hauser, DO   7 months ago Pre-diabetes   [facility] [place], Devonne Doughty, DO   1 year ago Annual physical exam   [facility] Olin Hauser, DO   1 year ago [place], Devonne Doughty, DO   1 year ago [place], DO      Future Appointments            In 3 months  [facility], [place]   In 4 months Parks Ranger, [facility], [facility]

## 2021-04-22 DIAGNOSIS — J449 Chronic obstructive pulmonary disease, unspecified: Secondary | ICD-10-CM | POA: Diagnosis not present

## 2021-05-10 ENCOUNTER — Ambulatory Visit (INDEPENDENT_AMBULATORY_CARE_PROVIDER_SITE_OTHER): Payer: Medicare Other | Admitting: Pharmacist

## 2021-05-10 DIAGNOSIS — I129 Hypertensive chronic kidney disease with stage 1 through stage 4 chronic kidney disease, or unspecified chronic kidney disease: Secondary | ICD-10-CM

## 2021-05-10 DIAGNOSIS — J449 Chronic obstructive pulmonary disease, unspecified: Secondary | ICD-10-CM

## 2021-05-10 NOTE — Chronic Care Management (AMB) (Signed)
Chronic Care Management CCM Pharmacy Note  05/10/2021 Name:  Candace Tapia MRN:  [id number] DOB:  [birth year]   Subjective: Candace Tapia is an 86 y.o. year old female who is a primary patient of Candace Hauser, DO.  The CCM team was consulted for assistance with disease management and care coordination needs.    Engaged with patient by telephone for follow up visit for pharmacy case management and/or care coordination services.   Objective:  Medications Reviewed Today     Reviewed by Candace Tapia, RPH-CPP (Pharmacist) on 05/10/21 at 1034  Med List Status: <None>   Medication Order Taking? Sig Documenting Provider Last Dose Status Informant  acetaminophen (TYLENOL) 650 MG CR tablet [id number] Yes Take 650 mg by mouth every 8 (eight) hours as needed for pain. [provider] Taking Active Self  albuterol (VENTOLIN HFA) 108 (90 Base) MCG/ACT inhaler [id number] Yes Inhale 1 puff into the lungs every 6 (six) hours as needed.  [provider] Taking Active Self           Med Note Candace Tapia   Sat May 13, 2017 10:07 AM)    amLODipine (NORVASC) 5 MG tablet [id number] Yes Take 1 tablet (5 mg total) by mouth daily. Candace Hauser, DO Taking Active   aspirin 81 MG chewable tablet [id number] No Chew 81 mg by mouth daily as needed.   Patient not taking: Reported on 07/21/2020   [provider] Not Taking Active            Med Note (Tapia, Candace A   Tue Jan 01, 2019  3:30 PM) Takes every once in a while  chlorthalidone (HYGROTON) 25 MG tablet [id number] Yes Take 1 tablet (25 mg total) by mouth daily. Candace Hauser, DO Taking Active   Cholecalciferol 25 MCG (1000 UT) capsule [id number] Yes Vitamin D3 25 mcg (1,000 unit) capsule  1 po qd [provider] Taking Active     Discontinued 08/18/19 1134          Med Note Candace Tapia   Sat May 13, 2017 10:03 AM)    fluticasone (FLONASE) 50 MCG/ACT nasal spray  [id number] Yes Place 1 spray into both nostrils as needed.  [provider] Taking Active Self  gabapentin (NEURONTIN) 400 MG capsule [id number] Yes TAKE 1 CAPSULE(400 MG) BY MOUTH TWICE DAILY [place]al[place], Candace Doughty, DO Taking Active   losartan (COZAAR) 100 MG tablet [id number] Yes Take 1 tablet (100 mg total) by mouth daily. Candace Hauser, DO Taking Active   meclizine (ANTIVERT) 25 MG tablet [id number] Yes Take 0.5 tablets (12.5 mg total) by mouth every 8 (eight) hours as needed for dizziness. Candace Hauser, DO Taking Active   Multiple Vitamins-Minerals (MULTIVITAMIN WOMEN 50+) TABS [id number] Yes Take 1 tablet by mouth daily.  [provider] Taking Active Self  NEXIUM 20 MG capsule [id number] Yes Take 1 capsule (20 mg total) by mouth daily. Arlis Porta., MD Taking Active Self  potassium chloride (MICRO-K) 10 MEQ CR capsule [id number] Yes TAKE 1 CAPSULE(10 MEQ) BY MOUTH DAILY. MAY OPEN CAPSULE AND SPRINKLE OVER SOFT FOOD OR. SWALLOW WHOLE [place], Candace Doughty, DO Taking Active   Spacer/Aero-Holding Chambers (AEROCHAMBER PLUS) inhaler [id number]  Use as instructed Candace Ripple, MD  Active   Med List Note Candace Specking, RN 02/08/16 1505): UDS 02/08/16 meds due 03/09/16            Pertinent Labs:    Lab  Results  Component Value Date   CREATININE 0.82 02/16/2021   BUN 16 02/16/2021   NA 141 02/16/2021   K 4.1 02/16/2021   CL 99 02/16/2021   CO2 30 02/16/2021   BP Readings from Last 3 Encounters:  02/23/21 139/71  09/21/20 (!) 113/42  03/02/20 (!) 127/52   Pulse Readings from Last 3 Encounters:  02/23/21 84  09/21/20 92  03/02/20 86    SDOH:  (Social Determinants of Health) assessments and interventions performed:    [place]  Review of patient past medical history, allergies, medications, health status, including review of consultants reports, laboratory and other test data, was performed as part of comprehensive  evaluation and provision of chronic care management services.   Care Plan : PharmD - Med Mgmt  Updates made by Candace Tapia, RPH-CPP since 05/10/2021 12:00 AM     Problem: Disease Progression      Long-Range Goal: Disease Progression Prevented or Minimized   Start Date: 06/22/2020  Expected End Date: 09/20/2020  This Visit's Progress: On track  Recent Progress: On track  Priority: High  Note:   Current Barriers:  Knowledge deficits related to basic COPD self care/management Knowledge deficit related to basic understanding of how to use inhalers and how inhaled medications work   Public house manager limitations  Pharmacist Clinical Goal(s):  Over the next 90 days, patient will achieve adherence to monitoring guidelines and medication adherence to achieve therapeutic efficacy through collaboration with PharmD and provider.   Interventions: 1:1 collaboration with Candace Hauser, DO regarding development and update of comprehensive plan of care as evidenced by provider attestation and co-signature Inter-disciplinary care team collaboration (see longitudinal plan of care) Perform chart review.  Patient seen for Office Visit with PCP on 02/23/2021 for annual physical.  Office Visit with [facility] on 10/4 for follow up. Patient had stopped Trelegy Provider advised patient: Continue albuterol prn (encouraged to take it as ordered) Nocturnal oxygen at 2 liters Comprehensive medication review performed; medication list updated in electronic medical record Confirms continues to use weekly pillbox to organize her medications Encourage patient to keep Vitamin D supplement with her other pill bottles to help make sure she includes this in weekly pillbox  Chronic Obstructive Pulmonary Disease: Patient followed by [facility] for management of COPD Current treatment: Albuterol as needed Nocturnal oxygen Identify patient currently  in need of refill of albuterol inhaler. Encourage patient to call pharmacy today for refill  Hypertension: Current treatment: Amlodipine 5 mg daily Chlorthalidone 25 mg daily Losartan 100 mg daily Note patient has a home upper arm BP monitor Denies checking home BP recently Encourage patient to check home BP, keeping log and bring log with her to medical appointments. Advise patient to call office sooner for readings outside of established parameters   Patient Goals/Self-Care Activities Over the next 90 days, patient will:  Attends all scheduled provider appointments Calls pharmacy for medication refills Calls provider office for new concerns or questions  Follow Up Plan: Telephone follow up appointment with care management team member scheduled for: 09/27/2021 at 10:45 am      Wallace Cullens, PharmD, [place], [facility] [place] [phone]

## 2021-05-10 NOTE — Patient Instructions (Signed)
Visit Information  Thank you for taking time to visit with me today. Please don't hesitate to contact me if I can be of assistance to you before our next scheduled telephone appointment.  Following are the goals we discussed today:   Goals Addressed             This Visit's Progress    Pharmacy Goals       Please check your home blood pressure, keep a log of the results and bring this with you to your medical appointments.   Feel free to call me with any questions or concerns. I look forward to our next call!  Wallace Cullens, PharmD, [place] [phone]         Our next appointment is by telephone on 09/27/2021 at 10:45 am  Please call the care guide team at [phone] if you need to cancel or reschedule your appointment.    The patient verbalized understanding of instructions, educational materials, and care plan provided today and declined offer to receive copy of patient instructions, educational materials, and care plan.

## 2021-05-16 ENCOUNTER — Other Ambulatory Visit: Payer: Self-pay | Admitting: Family Medicine

## 2021-05-16 DIAGNOSIS — G629 Polyneuropathy, unspecified: Secondary | ICD-10-CM

## 2021-05-17 NOTE — Telephone Encounter (Signed)
Requested Prescriptions  Pending Prescriptions Disp Refills   gabapentin (NEURONTIN) 400 MG capsule [Pharmacy Med Name: GABAPENTIN 400MG  CAPSULES] 180 capsule 1    Sig: TAKE 1 CAPSULE(400 MG) BY MOUTH TWICE DAILY     Neurology: Anticonvulsants - gabapentin Passed - 05/16/2021  2:47 PM      Passed - Valid encounter within last 12 months    Recent Outpatient Visits          2 months ago Annual physical exam   [place], DO   7 months ago Pre-diabetes   [facility] [place], Devonne Doughty, DO   1 year ago Annual physical exam   [facility] Olin Hauser, DO   1 year ago [place], Devonne Doughty, DO   1 year ago [place], DO      Future Appointments            In 2 months  [facility], [place]   In 3 months Parks Ranger, [facility], [facility]

## 2021-05-18 DIAGNOSIS — N182 Chronic kidney disease, stage 2 (mild): Secondary | ICD-10-CM

## 2021-05-18 DIAGNOSIS — J449 Chronic obstructive pulmonary disease, unspecified: Secondary | ICD-10-CM | POA: Diagnosis not present

## 2021-05-18 DIAGNOSIS — I129 Hypertensive chronic kidney disease with stage 1 through stage 4 chronic kidney disease, or unspecified chronic kidney disease: Secondary | ICD-10-CM

## 2021-05-23 DIAGNOSIS — J449 Chronic obstructive pulmonary disease, unspecified: Secondary | ICD-10-CM | POA: Diagnosis not present

## 2021-05-24 ENCOUNTER — Telehealth: Payer: Medicare Other

## 2021-05-24 ENCOUNTER — Ambulatory Visit (INDEPENDENT_AMBULATORY_CARE_PROVIDER_SITE_OTHER): Payer: Medicare Other

## 2021-05-24 DIAGNOSIS — J432 Centrilobular emphysema: Secondary | ICD-10-CM

## 2021-05-24 DIAGNOSIS — F3342 Major depressive disorder, recurrent, in full remission: Secondary | ICD-10-CM

## 2021-05-24 DIAGNOSIS — G894 Chronic pain syndrome: Secondary | ICD-10-CM

## 2021-05-24 DIAGNOSIS — I129 Hypertensive chronic kidney disease with stage 1 through stage 4 chronic kidney disease, or unspecified chronic kidney disease: Secondary | ICD-10-CM

## 2021-05-24 DIAGNOSIS — M1611 Unilateral primary osteoarthritis, right hip: Secondary | ICD-10-CM | POA: Diagnosis not present

## 2021-05-24 DIAGNOSIS — N182 Chronic kidney disease, stage 2 (mild): Secondary | ICD-10-CM

## 2021-05-24 DIAGNOSIS — J449 Chronic obstructive pulmonary disease, unspecified: Secondary | ICD-10-CM

## 2021-05-24 DIAGNOSIS — E782 Mixed hyperlipidemia: Secondary | ICD-10-CM

## 2021-05-24 DIAGNOSIS — M159 Polyosteoarthritis, unspecified: Secondary | ICD-10-CM

## 2021-05-24 NOTE — Patient Instructions (Signed)
Visit Information  Thank you for taking time to visit with me today. Please don't hesitate to contact me if I can be of assistance to you before our next scheduled telephone appointment.  Following are the goals we discussed today:  RNCM Clinical Goal(s):  Patient will verbalize understanding of plan for management of HTN, HLD, COPD, OA, and Depression  as evidenced by following the plan of care, working with the CCM team, taking medications, and compliance with dietary restricitons take all medications exactly as prescribed and will call provider for medication related questions as evidenced by compliance with medications    attend all scheduled medical appointments: 08-23-2021 as evidenced by keeping appointments and calling the office for needed schedule changes         demonstrate a decrease in HTN, HLD, COPD, OA, and Depression exacerbations  as evidenced by working with the CCM team to effectively manage health and well being  demonstrate ongoing self health care management ability for effective management of chronic conditions  as evidenced by working with the CCM team through collaboration with Consulting civil engineer, provider, and care team.    Interventions: 1:1 collaboration with primary care provider regarding development and update of comprehensive plan of care as evidenced by provider attestation and co-signature Inter-disciplinary care team collaboration (see longitudinal plan of care) Evaluation of current treatment plan related to  self management and patient's adherence to plan as established by provider     SDOH Barriers (Status: Goal on Track (progressing): YES.) Long Term Goal  Patient interviewed and SDOH assessment performed        Patient interviewed and appropriate assessments performed Provided patient with information about resources available and care guides to help with any changes in SDOH Discussed plans with patient for ongoing care management follow up and provided  patient with direct contact information for care management team Advised patient to call the office for changes in SDOH, questions or concerns       COPD: (Status: Goal on Track (progressing): YES.) Long Term Goal  Reviewed medications with patient, including use of prescribed maintenance and rescue inhalers, and provided instruction on medication management and the importance of adherence. 03-22-2021: The patient states that she is taking her medications as prescribed. Is using her inhalers when needed and has no concerns with medications at this time. Has upcoming appointment with pharmacy next week. 05-24-2021: The patient is having some issues with her breathing but not bad. Some is in part to the changes in the weather. She states she does well when she is resting but she admits when she is moving around she gets short of breath. She denies any acute distress today. Is taking medications as prescribed. Will continue to monitor.  Provided patient with basic written and verbal COPD education on self care/management/and exacerbation prevention Advised patient to track and manage COPD triggers. 03-22-2021: The patient states she is stable. She stays in her apartment most of the time as she gets really "give out" when going out and walking, she just can't do that like she use to do it. She knows things that trigger her COPD. COPD is stable at this time. 05-24-2021: Review of triggers that can cause exacerbation. The patient  is aware of factors that cause issues. She stays in and does not expose herself to others outside of her apartment unless she needs to go out. She states she is watching for weather changes and her family if they are sick around her. She is going to  the specialist today to see about her arthritis pain and she states she will be mindful of others around her.  Provided written and verbal instructions on pursed lip breathing and utilized returned demonstration as teach back Provided instruction  about proper use of medications used for management of COPD including inhalers. 05-24-2021: Review and the patient has her inhalers and knows how to use effectively.  Advised patient to self assesses COPD action plan zone and make appointment with provider if in the yellow zone for 48 hours without improvement Advised patient to engage in light exercise as tolerated 3-5 days a week to aid in the the management of COPD Provided education about and advised patient to utilize infection prevention strategies to reduce risk of respiratory infection. 05-24-2021: The patient is aware of triggers that cause exacerbation of COPD. Is mindful of risk of infection and monitors who comes to visit her. Review of safety precautions, high risk for infection during this season, and monitoring for illness in others who are around her.  Discussed the importance of adequate rest and management of fatigue with COPD Screening for signs and symptoms of depression related to chronic disease state  Assessed social determinant of health barriers. 05-24-2021: The patient has no new needs at this time. Has eye exam coming up on 06-10-2021 and will see the provider then. They are monitoring for a cataract on her left eye. She denies any acute findings, or changes in her vision.    Depression   (Status: Goal on Track (progressing): YES.) Long Term Goal  Evaluation of current treatment plan related to Depression and social isolation  , Mental Health Concerns  self-management and patient's adherence to plan as established by provider. 05-24-2021: The patient deals with a lot of loneliness and is the only living sibling out of 5 children. She states that she does not know why God is allowing her to  hang around. She states that she is waiting around to "die". The patient denies SI or hurting herself. She states getting old is not for "sissy's". She enjoys outreaches with the CCM team and is thankful for the care and concern she is given. She states  that she eats "too well" and is a "fat old woman".  She does not do a lot of activity and watches a lot of TV. The patient is going out today with her daughter in law to the orthopedic specialist to see about the OA pain she is experiencing. Reflective listening, emotional support and eduction provided. Will continue to monitor for changes.  Discussed plans with patient for ongoing care management follow up and provided patient with direct contact information for care management team Advised patient to call the office for changes in mood, anxiety of depression ; Provided education to patient re: working with the CCM team to optimize mental health needs ; Reviewed medications with patient and discussed compliance; Reviewed scheduled/upcoming provider appointments including 08-23-2021; Discussed plans with patient for ongoing care management follow up and provided patient with direct contact information for care management team; Evaluation of depression: 03-22-2021: The patient was having a good day today. She states that she has always been a "loner" and for the most part she deals with this very well. She states that sometimes she gets really down and depressed about things. The patient had a nice Thanksgiving and will be spending Christmas with her son and his wife. She states she does not go out much because of her chronic conditions and overall health. Knows  resources are available.  She has good rapport with the CCM team and enjoys talking to them on a regular basis. She says this helps her a lot.    Hyperlipidemia:  (Status: Goal on Track (progressing): YES.) Long Term Goal       Lab Results  Component Value Date    CHOL 202 (H) 02/16/2021    HDL 50 02/16/2021    LDLCALC 116 (H) 02/16/2021    TRIG 238 (H) 02/16/2021    CHOLHDL 4.0 02/16/2021      Medication review performed; medication list updated in electronic medical record. 05-24-2021: The patient cannot tolerate statins. The patient denies  any issues with her medications at this time.  Provider established cholesterol goals reviewed; Counseled on importance of regular laboratory monitoring as prescribed. 05-24-2021: The patient is compliant with having routine lab work done.  Provided HLD educational materials; Reviewed role and benefits of statin for ASCVD risk reduction; Discussed strategies to manage statin-induced myalgias; Reviewed importance of limiting foods high in cholesterol. 05-24-2021 Review of heart healthy diet. The patient admits she should lose weight that it would help her feel much better. The patient states that eating is one of the things she enjoys. She denies any acute findings related to her dietary consumption. Encouraged the patient to monitor for fats, sodium, and concentrated sweets in her diet.    Hypertension: (Status: Goal on Track (progressing): YES.) Last practice recorded BP readings:     BP Readings from Last 3 Encounters:  02/23/21 139/71  09/21/20 (!) 113/42  03/02/20 (!) 127/52  Most recent eGFR/CrCl:       Lab Results  Component Value Date    EGFR 71 02/16/2021    No components found for: CRCL   Evaluation of current treatment plan related to hypertension self management and patient's adherence to plan as established by provider. 05-24-2021: The patient is doing well with management of her HTN. She denies any acute findings with HTN or heart health. Will continue to monitor;   Provided education to patient re: stroke prevention, s/s of heart attack and stroke; Reviewed prescribed diet heart healthy. 05-24-2021: Review of heart healthy diet. Education on eating fruits and vegetables and variety of foods.  Reviewed medications with patient and discussed importance of compliance. 05-24-2021: The patient is compliant with her medications. Denies any medication needs;  Discussed plans with patient for ongoing care management follow up and provided patient with direct contact information for care  management team; Advised patient, providing education and rationale, to monitor blood pressure daily and record, calling PCP for findings outside established parameters. 03-22-2021: The patient had not taken her blood pressure this am. The patient states that she does not take it everyday but it has been good. She says she takes 3 different medications for blood pressure control. She denies any orthostatic hypotension but review of orthostatic hypotension and changing position slowly to prevent falls or injuries.05-24-2021: the patient denies any acute changes in her blood pressure. Feels she is doing very well with control of her blood pressures and heart health.  Advised patient to discuss trends in blood pressure with provider; Provided education on prescribed diet heart healthy ;  Discussed complications of poorly controlled blood pressure such as heart disease, stroke, circulatory complications, vision complications, kidney impairment, sexual dysfunction;      Pain:  (Status: Goal on Track (progressing): YES.) Long Term Goal  Pain assessment performed. 05-24-2021: The patient states when she is sitting of laying she does not have  pain. Long standing  history of OA pain in her joints. Has it all over but her right hip and knee are causing a lot of issues right now. Denies pain at the time of the call as she was sitting, but states when she is walking sometimes it is unbearable. The patient states that she is going to the specialist today to see what recommendations they have. She has had injections in her shoulders before and that has been helpful for her.  Medications reviewed. 05-24-2021: The patient states she was taking Tylenol Arthritis for her pain level but now she is taking Aleve and it works better for her. Education on discussing pain relief options with the specialist at her appointment today.  Reviewed provider established plan for pain management. 05-24-2021: The patient is going today to the ortho  specialist for evaluation of OA, mainly pain in her right hip and knee that is worse when ambulating. The patient endorses using DME when ambulating, practicing safety and denies falls; Discussed importance of adherence to all scheduled medical appointments. 05-24-2021: The patient has an appointment today with the orthopedic specialist to see about worsening pain in her right hip and knee; Counseled on the importance of reporting any/all new or changed pain symptoms or management strategies to pain management provider; Advised patient to report to care team affect of pain on daily activities; Discussed use of relaxation techniques and/or diversional activities to assist with pain reduction (distraction, imagery, relaxation, massage, acupressure, TENS, heat, and cold application. 05-24-2021: Evaluation of heat therapy to affected areas. The patient states she has actually never tried that. The patient states that may be something to consider. The patient encouraged to ask the provider today for non-evasive ways to help with pain and discomfort from OA. Reviewed with patient prescribed pharmacological and nonpharmacological pain relief strategies; Advised patient to discuss unresolved pain, changes in level or intensity of pain with provider;    Patient Goals/Self-Care Activities: Patient will self administer medications as prescribed as evidenced by self report/primary caregiver report  Patient will attend all scheduled provider appointments as evidenced by clinician review of documented attendance to scheduled appointments and patient/caregiver report Patient will call pharmacy for medication refills as evidenced by patient report and review of pharmacy fill history as appropriate Patient will attend church or other social activities as evidenced by patient report Patient will continue to perform ADL's independently as evidenced by patient/caregiver report Patient will continue to perform IADL's  independently as evidenced by patient/caregiver report Patient will call provider office for new concerns or questions as evidenced by review of documented incoming telephone call notes and patient report Patient will work with BSW to address care coordination needs and will continue to work with the clinical team to address health care and disease management related needs as evidenced by documented adherence to scheduled care management/care coordination appointments - eliminate smoking in my home - identify and remove indoor air pollutants - limit outdoor activity during cold weather - listen for public air quality announcements every day - do breathing exercises every day - arrange respite care for caregiver - develop a rescue plan - eliminate symptom triggers at home - follow rescue plan if symptoms flare-up - use an extra pillow to sleep - develop a new routine to improve sleep - don't eat or exercise right before bedtime - eat healthy/prescribed diet: heart healthy diet  - get at least 7 to 8 hours of sleep at night - use devices that will help like a  cane, sock-puller or reacher - practice relaxation or meditation daily - do exercises in a comfortable position that makes breathing as easy as possible - check blood pressure weekly - choose a place to take my blood pressure (home, clinic or office, retail store) - write blood pressure results in a log or diary - learn about high blood pressure - keep a blood pressure log - take blood pressure log to all doctor appointments - call doctor for signs and symptoms of high blood pressure - develop an action plan for high blood pressure - keep all doctor appointments: Specialist today 05-24-2021 and eye doctor on 06-10-2021 - take medications for blood pressure exactly as prescribed - report new symptoms to your doctor - eat more whole grains, fruits and vegetables, lean meats and healthy fats - call for medicine refill 2 or 3 days before  it runs out - take all medications exactly as prescribed - call doctor with any symptoms you believe are related to your medicine - call doctor when you experience any new symptoms - go to all doctor appointments as scheduled - adhere to prescribed diet: heart healthy          Our next appointment is by telephone on 07-12-2021 at 0900 am  Please call the care guide team at [phone] if you need to cancel or reschedule your appointment.   If you are experiencing a Mental Health or [place] or need someone to talk to, please call the Suicide and Crisis Lifeline: 988 call the [place] National Suicide Prevention Lifeline: (581) 122-0411 or TTY: (947)055-2719 TTY 605 194 8145) to talk to a trained counselor call 1-800-273-TALK (toll free, 24 hour hotline)   The patient verbalized understanding of instructions, educational materials, and care plan provided today and declined offer to receive copy of patient instructions, educational materials, and care plan.   Noreene Larsson RN, MSN, [place]: [phone]

## 2021-05-24 NOTE — Chronic Care Management (AMB) (Signed)
Chronic Care Management   CCM RN Visit Note  05/24/2021 Name: CHISTINA ROSTON MRN: [id number] DOB: 07-31-1935  Subjective: LANIE SCHELLING is a 86 y.o. year old female who is a primary care patient of Olin Hauser, DO. The care management team was consulted for assistance with disease management and care coordination needs.    Engaged with patient by telephone for follow up visit in response to provider referral for case management and/or care coordination services.   Consent to Services:  The patient was given information about Chronic Care Management services, agreed to services, and gave verbal consent prior to initiation of services.  Please see initial visit note for detailed documentation.   Patient agreed to services and verbal consent obtained.   Assessment: Review of patient past medical history, allergies, medications, health status, including review of consultants reports, laboratory and other test data, was performed as part of comprehensive evaluation and provision of chronic care management services.   SDOH (Social Determinants of Health) assessments and interventions performed:    CCM Care Plan  Allergies  Allergen Reactions   Tramadol Hcl Itching    Per pt it caused me to itch all over   Statins     Not sure what exact med was; just made her feel bad (can't elaborate)   Penicillin G Rash   Penicillins Rash    Has patient had a PCN reaction causing immediate rash, facial/tongue/throat swelling, SOB or lightheadedness with hypotension: No Has patient had a PCN reaction causing severe rash involving mucus membranes or skin necrosis: No Has patient had a PCN reaction that required hospitalization: No Has patient had a PCN reaction occurring within the last 10 years: No If all of the above answers are "NO", then may proceed with Cephalosporin use.   Sulfa Antibiotics Other (See Comments) and Rash    Outpatient Encounter Medications as of 05/24/2021   Medication Sig Note   acetaminophen (TYLENOL) 650 MG CR tablet Take 650 mg by mouth every 8 (eight) hours as needed for pain.    albuterol (VENTOLIN HFA) 108 (90 Base) MCG/ACT inhaler Inhale 1 puff into the lungs every 6 (six) hours as needed.     amLODipine (NORVASC) 5 MG tablet Take 1 tablet (5 mg total) by mouth daily.    aspirin 81 MG chewable tablet Chew 81 mg by mouth daily as needed.  (Patient not taking: Reported on 07/21/2020) 01/01/2019: Takes every once in a while   chlorthalidone (HYGROTON) 25 MG tablet Take 1 tablet (25 mg total) by mouth daily.    Cholecalciferol 25 MCG (1000 UT) capsule Vitamin D3 25 mcg (1,000 unit) capsule  1 po qd    fluticasone (FLONASE) 50 MCG/ACT nasal spray Place 1 spray into both nostrils as needed.     gabapentin (NEURONTIN) 400 MG capsule TAKE 1 CAPSULE(400 MG) BY MOUTH TWICE DAILY    losartan (COZAAR) 100 MG tablet Take 1 tablet (100 mg total) by mouth daily.    meclizine (ANTIVERT) 25 MG tablet Take 0.5 tablets (12.5 mg total) by mouth every 8 (eight) hours as needed for dizziness.    Multiple Vitamins-Minerals (MULTIVITAMIN WOMEN 50+) TABS Take 1 tablet by mouth daily.     NEXIUM 20 MG capsule Take 1 capsule (20 mg total) by mouth daily.    potassium chloride (MICRO-K) 10 MEQ CR capsule TAKE 1 CAPSULE(10 MEQ) BY MOUTH DAILY. MAY OPEN CAPSULE AND SPRINKLE OVER SOFT FOOD OR. SWALLOW WHOLE    Spacer/Aero-Holding Chambers (AEROCHAMBER PLUS) inhaler  Use as instructed    [DISCONTINUED] fexofenadine (ALLEGRA ALLERGY) 180 MG tablet Take 180 mg by mouth as needed. Reported on 09/29/2015    No facility-administered encounter medications on file as of 05/24/2021.    Patient Active Problem List   Diagnosis Date Noted   Obesity (BMI 35.0-39.9 without comorbidity) 01/09/2019   Peripheral polyneuropathy 12/27/2017   Vestibular disorder 03/20/2017   CKD (chronic kidney disease), stage II 12/09/2016   Chronic venous insufficiency 11/25/2016   Bilateral lower  extremity edema 10/27/2016   Pre-diabetes 08/12/2016   Carotid bruit 02/09/2016   History of left breast cancer 02/09/2016   Long term current use of opiate analgesic 02/08/2016   Long term prescription opiate use 02/08/2016   Drug level above therapeutic range 02/08/2016   Chronic low back pain (Location of Tertiary source of pain) (Bilateral) (R>L) 02/08/2016   Chronic knee pain (Location of Secondary source of pain) (Bilateral) (L>R) 02/08/2016   History bilateral shoulder replacement 02/08/2016   Chronic hip pain (Location of Primary Source of Pain) (Bilateral) (R>L) 01/07/2016   Complete rupture of rotator cuff 01/07/2016   Hyperlipidemia 01/07/2016   Osteopenia 01/07/2016   Centrilobular emphysema ([place]) 07/21/2015   DDD (degenerative disc disease), lumbar 03/05/2015   Lumbar facet syndrome (Bilateral) (R>L) 03/05/2015   Sacroiliac joint dysfunction 03/05/2015   Osteoarthritis of hip (Bilateral) (R>L) 03/05/2015   DJD of shoulder 03/05/2015   Abnormal finding on mammography 03/03/2015   Back pain with radiation 03/03/2015   Major depression in full remission ([place]) 03/03/2015   Dry skin 03/03/2015   Hypokalemia 03/03/2015   Drug-induced myopathy 03/03/2015   Gastro-esophageal reflux disease without esophagitis 03/03/2015   Allergic rhinitis, seasonal 03/03/2015   Osteoarthritis of multiple joints 03/03/2015   Benign hypertension with CKD (chronic kidney disease), stage II 11/28/2014   Chronic pain 11/28/2014   Vitamin D deficiency 11/28/2014   Airway hyperreactivity 09/18/2013   Microscopic hematuria 12/20/2011    Conditions to be addressed/monitored:HTN, HLD, COPD, Depression, Osteoarthritis, and Tobacco Use  Care Plan : RNCM: General Plan of Care (Adult) for Chronic Disease Management and Care Coordination Needs  Updates made by Vanita Ingles, RN since 05/24/2021 12:00 AM     Problem: RNCM: Developement of Plan of Care For Chronic Disease Management (HTN, HLD,  Depression, COPD, Chronic pain- Athritis)   Priority: High     Long-Range Goal: RNCM: Developement of Plan of Care For Chronic Disease Management (HTN, HLD, Depression, COPD,and Chronic Pain Arthritis)   Start Date: 03/01/2021  Expected End Date: 03/01/2022  Priority: High  Note:   Current Barriers:  Knowledge Deficits related to plan of care for management of HTN, HLD, COPD, and Depression: depressed mood, Chronic pain- OA anxiety  Chronic Disease Management support and education needs related to HTN, HLD, COPD, and Depression: depressed mood, Chronic pain, OA anxiety Lacks caregiver support.         RNCM Clinical Goal(s):  Patient will verbalize understanding of plan for management of HTN, HLD, COPD, OA, and Depression  as evidenced by following the plan of care, working with the CCM team, taking medications, and compliance with dietary restricitons take all medications exactly as prescribed and will call provider for medication related questions as evidenced by compliance with medications    attend all scheduled medical appointments: 08-23-2021 as evidenced by keeping appointments and calling the office for needed schedule changes         demonstrate a decrease in HTN, HLD, COPD, OA, and Depression exacerbations  as  evidenced by working with the CCM team to effectively manage health and well being  demonstrate ongoing self health care management ability for effective management of chronic conditions  as evidenced by working with the CCM team through collaboration with Consulting civil engineer, provider, and care team.   Interventions: 1:1 collaboration with primary care provider regarding development and update of comprehensive plan of care as evidenced by provider attestation and co-signature Inter-disciplinary care team collaboration (see longitudinal plan of care) Evaluation of current treatment plan related to  self management and patient's adherence to plan as established by provider   SDOH  Barriers (Status: Goal on Track (progressing): YES.) Long Term Goal  Patient interviewed and SDOH assessment performed        Patient interviewed and appropriate assessments performed Provided patient with information about resources available and care guides to help with any changes in SDOH Discussed plans with patient for ongoing care management follow up and provided patient with direct contact information for care management team Advised patient to call the office for changes in SDOH, questions or concerns    COPD: (Status: Goal on Track (progressing): YES.) Long Term Goal  Reviewed medications with patient, including use of prescribed maintenance and rescue inhalers, and provided instruction on medication management and the importance of adherence. 03-22-2021: The patient states that she is taking her medications as prescribed. Is using her inhalers when needed and has no concerns with medications at this time. Has upcoming appointment with pharmacy next week. 05-24-2021: The patient is having some issues with her breathing but not bad. Some is in part to the changes in the weather. She states she does well when she is resting but she admits when she is moving around she gets short of breath. She denies any acute distress today. Is taking medications as prescribed. Will continue to monitor.  Provided patient with basic written and verbal COPD education on self care/management/and exacerbation prevention Advised patient to track and manage COPD triggers. 03-22-2021: The patient states she is stable. She stays in her apartment most of the time as she gets really "give out" when going out and walking, she just can't do that like she use to do it. She knows things that trigger her COPD. COPD is stable at this time. 05-24-2021: Review of triggers that can cause exacerbation. The patient  is aware of factors that cause issues. She stays in and does not expose herself to others outside of her apartment unless  she needs to go out. She states she is watching for weather changes and her family if they are sick around her. She is going to the specialist today to see about her arthritis pain and she states she will be mindful of others around her.  Provided written and verbal instructions on pursed lip breathing and utilized returned demonstration as teach back Provided instruction about proper use of medications used for management of COPD including inhalers. 05-24-2021: Review and the patient has her inhalers and knows how to use effectively.  Advised patient to self assesses COPD action plan zone and make appointment with provider if in the yellow zone for 48 hours without improvement Advised patient to engage in light exercise as tolerated 3-5 days a week to aid in the the management of COPD Provided education about and advised patient to utilize infection prevention strategies to reduce risk of respiratory infection. 05-24-2021: The patient is aware of triggers that cause exacerbation of COPD. Is mindful of risk of infection and monitors who comes  to visit her. Review of safety precautions, high risk for infection during this season, and monitoring for illness in others who are around her.  Discussed the importance of adequate rest and management of fatigue with COPD Screening for signs and symptoms of depression related to chronic disease state  Assessed social determinant of health barriers. 05-24-2021: The patient has no new needs at this time. Has eye exam coming up on 06-10-2021 and will see the provider then. They are monitoring for a cataract on her left eye. She denies any acute findings, or changes in her vision.   Depression   (Status: Goal on Track (progressing): YES.) Long Term Goal  Evaluation of current treatment plan related to Depression and social isolation  , Mental Health Concerns  self-management and patient's adherence to plan as established by provider. 05-24-2021: The patient deals with a lot of  loneliness and is the only living sibling out of 5 children. She states that she does not know why God is allowing her to  hang around. She states that she is waiting around to "die". The patient denies SI or hurting herself. She states getting old is not for "sissy's". She enjoys outreaches with the CCM team and is thankful for the care and concern she is given. She states that she eats "too well" and is a "fat old woman".  She does not do a lot of activity and watches a lot of TV. The patient is going out today with her daughter in law to the orthopedic specialist to see about the OA pain she is experiencing. Reflective listening, emotional support and eduction provided. Will continue to monitor for changes.  Discussed plans with patient for ongoing care management follow up and provided patient with direct contact information for care management team Advised patient to call the office for changes in mood, anxiety of depression ; Provided education to patient re: working with the CCM team to optimize mental health needs ; Reviewed medications with patient and discussed compliance; Reviewed scheduled/upcoming provider appointments including 08-23-2021; Discussed plans with patient for ongoing care management follow up and provided patient with direct contact information for care management team; Evaluation of depression: 03-22-2021: The patient was having a good day today. She states that she has always been a "loner" and for the most part she deals with this very well. She states that sometimes she gets really down and depressed about things. The patient had a nice Thanksgiving and will be spending Christmas with her son and his wife. She states she does not go out much because of her chronic conditions and overall health. Knows resources are available.  She has good rapport with the CCM team and enjoys talking to them on a regular basis. She says this helps her a lot.   Hyperlipidemia:  (Status: Goal on  Track (progressing): YES.) Long Term Goal  Lab Results  Component Value Date   CHOL 202 (H) 02/16/2021   HDL 50 02/16/2021   LDLCALC 116 (H) 02/16/2021   TRIG 238 (H) 02/16/2021   CHOLHDL 4.0 02/16/2021     Medication review performed; medication list updated in electronic medical record. 05-24-2021: The patient cannot tolerate statins. The patient denies any issues with her medications at this time.  Provider established cholesterol goals reviewed; Counseled on importance of regular laboratory monitoring as prescribed. 05-24-2021: The patient is compliant with having routine lab work done.  Provided HLD educational materials; Reviewed role and benefits of statin for ASCVD risk reduction; Discussed strategies to  manage statin-induced myalgias; Reviewed importance of limiting foods high in cholesterol. 05-24-2021 Review of heart healthy diet. The patient admits she should lose weight that it would help her feel much better. The patient states that eating is one of the things she enjoys. She denies any acute findings related to her dietary consumption. Encouraged the patient to monitor for fats, sodium, and concentrated sweets in her diet.   Hypertension: (Status: Goal on Track (progressing): YES.) Last practice recorded BP readings:  BP Readings from Last 3 Encounters:  02/23/21 139/71  09/21/20 (!) 113/42  03/02/20 (!) 127/52  Most recent eGFR/CrCl:  Lab Results  Component Value Date   EGFR 71 02/16/2021    No components found for: CRCL  Evaluation of current treatment plan related to hypertension self management and patient's adherence to plan as established by provider. 05-24-2021: The patient is doing well with management of her HTN. She denies any acute findings with HTN or heart health. Will continue to monitor;   Provided education to patient re: stroke prevention, s/s of heart attack and stroke; Reviewed prescribed diet heart healthy. 05-24-2021: Review of heart healthy diet. Education  on eating fruits and vegetables and variety of foods.  Reviewed medications with patient and discussed importance of compliance. 05-24-2021: The patient is compliant with her medications. Denies any medication needs;  Discussed plans with patient for ongoing care management follow up and provided patient with direct contact information for care management team; Advised patient, providing education and rationale, to monitor blood pressure daily and record, calling PCP for findings outside established parameters. 03-22-2021: The patient had not taken her blood pressure this am. The patient states that she does not take it everyday but it has been good. She says she takes 3 different medications for blood pressure control. She denies any orthostatic hypotension but review of orthostatic hypotension and changing position slowly to prevent falls or injuries.05-24-2021: the patient denies any acute changes in her blood pressure. Feels she is doing very well with control of her blood pressures and heart health.  Advised patient to discuss trends in blood pressure with provider; Provided education on prescribed diet heart healthy ;  Discussed complications of poorly controlled blood pressure such as heart disease, stroke, circulatory complications, vision complications, kidney impairment, sexual dysfunction;    Pain:  (Status: Goal on Track (progressing): YES.) Long Term Goal  Pain assessment performed. 05-24-2021: The patient states when she is sitting of laying she does not have pain. Long standing  history of OA pain in her joints. Has it all over but her right hip and knee are causing a lot of issues right now. Denies pain at the time of the call as she was sitting, but states when she is walking sometimes it is unbearable. The patient states that she is going to the specialist today to see what recommendations they have. She has had injections in her shoulders before and that has been helpful for her.  Medications  reviewed. 05-24-2021: The patient states she was taking Tylenol Arthritis for her pain level but now she is taking Aleve and it works better for her. Education on discussing pain relief options with the specialist at her appointment today.  Reviewed provider established plan for pain management. 05-24-2021: The patient is going today to the ortho specialist for evaluation of OA, mainly pain in her right hip and knee that is worse when ambulating. The patient endorses using DME when ambulating, practicing safety and denies falls; Discussed importance of adherence to  all scheduled medical appointments. 05-24-2021: The patient has an appointment today with the orthopedic specialist to see about worsening pain in her right hip and knee; Counseled on the importance of reporting any/all new or changed pain symptoms or management strategies to pain management provider; Advised patient to report to care team affect of pain on daily activities; Discussed use of relaxation techniques and/or diversional activities to assist with pain reduction (distraction, imagery, relaxation, massage, acupressure, TENS, heat, and cold application. 05-24-2021: Evaluation of heat therapy to affected areas. The patient states she has actually never tried that. The patient states that may be something to consider. The patient encouraged to ask the provider today for non-evasive ways to help with pain and discomfort from OA. Reviewed with patient prescribed pharmacological and nonpharmacological pain relief strategies; Advised patient to discuss unresolved pain, changes in level or intensity of pain with provider;   Patient Goals/Self-Care Activities: Patient will self administer medications as prescribed as evidenced by self report/primary caregiver report  Patient will attend all scheduled provider appointments as evidenced by clinician review of documented attendance to scheduled appointments and patient/caregiver report Patient will call  pharmacy for medication refills as evidenced by patient report and review of pharmacy fill history as appropriate Patient will attend church or other social activities as evidenced by patient report Patient will continue to perform ADL's independently as evidenced by patient/caregiver report Patient will continue to perform IADL's independently as evidenced by patient/caregiver report Patient will call provider office for new concerns or questions as evidenced by review of documented incoming telephone call notes and patient report Patient will work with BSW to address care coordination needs and will continue to work with the clinical team to address health care and disease management related needs as evidenced by documented adherence to scheduled care management/care coordination appointments - eliminate smoking in my home - identify and remove indoor air pollutants - limit outdoor activity during cold weather - listen for public air quality announcements every day - do breathing exercises every day - arrange respite care for caregiver - develop a rescue plan - eliminate symptom triggers at home - follow rescue plan if symptoms flare-up - use an extra pillow to sleep - develop a new routine to improve sleep - don't eat or exercise right before bedtime - eat healthy/prescribed diet: heart healthy diet  - get at least 7 to 8 hours of sleep at night - use devices that will help like a cane, sock-puller or reacher - practice relaxation or meditation daily - do exercises in a comfortable position that makes breathing as easy as possible - check blood pressure weekly - choose a place to take my blood pressure (home, clinic or office, retail store) - write blood pressure results in a log or diary - learn about high blood pressure - keep a blood pressure log - take blood pressure log to all doctor appointments - call doctor for signs and symptoms of high blood pressure - develop an action  plan for high blood pressure - keep all doctor appointments: Specialist today 05-24-2021 and eye doctor on 06-10-2021 - take medications for blood pressure exactly as prescribed - report new symptoms to your doctor - eat more whole grains, fruits and vegetables, lean meats and healthy fats - call for medicine refill 2 or 3 days before it runs out - take all medications exactly as prescribed - call doctor with any symptoms you believe are related to your medicine - call doctor when you experience any  new symptoms - go to all doctor appointments as scheduled - adhere to prescribed diet: heart healthy       Plan:Telephone follow up appointment with care management team member scheduled for:  07-12-2021 at 0900 am  [place], MSN, [place] Mobile: [phone]

## 2021-06-08 ENCOUNTER — Telehealth: Payer: Medicare Other

## 2021-06-08 ENCOUNTER — Telehealth: Payer: Self-pay | Admitting: Licensed Clinical Social Worker

## 2021-06-08 NOTE — Telephone Encounter (Signed)
° °   Clinical Social Work  Care Management   Phone Outreach    06/08/2021 Name: Candace Tapia MRN: [id number] DOB: [birth year]  [name] is a 86 y.o. year old female who is a primary care patient of Olin Hauser, DO .   Reason for referral: [place] [facility].    F/U phone call today to assess needs, progress and barriers with care plan goals.   Telephone outreach was unsuccessful. A HIPPA compliant phone message was left for the patient providing contact information and requesting a return call.   Plan:CCM LCSW will wait for return call. If no return call is received, Will route chart to Care Guide to see if patient would like to reschedule phone appointment   Review of patient status, including review of consultants reports, relevant laboratory and other test results, and collaboration with appropriate care team members and the patient's provider was performed as part of comprehensive patient evaluation and provision of care management services.    Christa See, MSW, [facility] Care Management [email or web address] Phone [phone] 5:00 PM

## 2021-06-15 DIAGNOSIS — M159 Polyosteoarthritis, unspecified: Secondary | ICD-10-CM

## 2021-06-15 DIAGNOSIS — J432 Centrilobular emphysema: Secondary | ICD-10-CM | POA: Diagnosis not present

## 2021-06-15 DIAGNOSIS — E782 Mixed hyperlipidemia: Secondary | ICD-10-CM

## 2021-06-15 DIAGNOSIS — F3342 Major depressive disorder, recurrent, in full remission: Secondary | ICD-10-CM | POA: Diagnosis not present

## 2021-06-15 DIAGNOSIS — I129 Hypertensive chronic kidney disease with stage 1 through stage 4 chronic kidney disease, or unspecified chronic kidney disease: Secondary | ICD-10-CM

## 2021-06-15 DIAGNOSIS — N182 Chronic kidney disease, stage 2 (mild): Secondary | ICD-10-CM | POA: Diagnosis not present

## 2021-06-15 DIAGNOSIS — J449 Chronic obstructive pulmonary disease, unspecified: Secondary | ICD-10-CM | POA: Diagnosis not present

## 2021-06-20 DIAGNOSIS — J449 Chronic obstructive pulmonary disease, unspecified: Secondary | ICD-10-CM | POA: Diagnosis not present

## 2021-06-22 DIAGNOSIS — M2391 Unspecified internal derangement of right knee: Secondary | ICD-10-CM | POA: Diagnosis not present

## 2021-06-30 DIAGNOSIS — M48061 Spinal stenosis, lumbar region without neurogenic claudication: Secondary | ICD-10-CM | POA: Diagnosis not present

## 2021-06-30 DIAGNOSIS — M47896 Other spondylosis, lumbar region: Secondary | ICD-10-CM | POA: Diagnosis not present

## 2021-07-12 ENCOUNTER — Ambulatory Visit (INDEPENDENT_AMBULATORY_CARE_PROVIDER_SITE_OTHER): Payer: Medicare Other

## 2021-07-12 ENCOUNTER — Telehealth: Payer: Medicare Other

## 2021-07-12 DIAGNOSIS — G894 Chronic pain syndrome: Secondary | ICD-10-CM

## 2021-07-12 DIAGNOSIS — F3342 Major depressive disorder, recurrent, in full remission: Secondary | ICD-10-CM

## 2021-07-12 DIAGNOSIS — I10 Essential (primary) hypertension: Secondary | ICD-10-CM

## 2021-07-12 DIAGNOSIS — J432 Centrilobular emphysema: Secondary | ICD-10-CM

## 2021-07-12 DIAGNOSIS — E782 Mixed hyperlipidemia: Secondary | ICD-10-CM

## 2021-07-12 DIAGNOSIS — M159 Polyosteoarthritis, unspecified: Secondary | ICD-10-CM

## 2021-07-12 DIAGNOSIS — J449 Chronic obstructive pulmonary disease, unspecified: Secondary | ICD-10-CM

## 2021-07-12 DIAGNOSIS — I129 Hypertensive chronic kidney disease with stage 1 through stage 4 chronic kidney disease, or unspecified chronic kidney disease: Secondary | ICD-10-CM

## 2021-07-12 NOTE — Chronic Care Management (AMB) (Signed)
?Chronic Care Management  ? ?CCM RN Visit Note ? ?07/12/2021 ?Name: Candace Tapia MRN: [id number] DOB: 01-16-1936 ? ?Subjective: ?Candace Tapia is a 86 y.o. year old female who is a primary care patient of Olin Hauser, DO. The care management team was consulted for assistance with disease management and care coordination needs.   ? ?Engaged with patient by telephone for follow up visit in response to provider referral for case management and/or care coordination services.  ? ?Consent to Services:  ?The patient was given information about Chronic Care Management services, agreed to services, and gave verbal consent prior to initiation of services.  Please see initial visit note for detailed documentation.  ? ?Patient agreed to services and verbal consent obtained.  ? ?Assessment: Review of patient past medical history, allergies, medications, health status, including review of consultants reports, laboratory and other test data, was performed as part of comprehensive evaluation and provision of chronic care management services.  ? ?SDOH (Social Determinants of Health) assessments and interventions performed:   ? ?CCM Care Plan ? ?Allergies  ?Allergen Reactions  ? Tramadol Hcl Itching  ?  Per pt it caused me to itch all over  ? Statins   ?  Not sure what exact med was; just made her feel bad (can't elaborate)  ? Penicillin G Rash  ? Penicillins Rash  ?  Has patient had a PCN reaction causing immediate rash, facial/tongue/throat swelling, SOB or lightheadedness with hypotension: No ?Has patient had a PCN reaction causing severe rash involving mucus membranes or skin necrosis: No ?Has patient had a PCN reaction that required hospitalization: No ?Has patient had a PCN reaction occurring within the last 10 years: No ?If all of the above answers are "NO", then may proceed with Cephalosporin use.  ? Sulfa Antibiotics Other (See Comments) and Rash  ? ? ?Outpatient Encounter Medications as of 07/12/2021   ?Medication Sig Note  ? acetaminophen (TYLENOL) 650 MG CR tablet Take 650 mg by mouth every 8 (eight) hours as needed for pain.   ? albuterol (VENTOLIN HFA) 108 (90 Base) MCG/ACT inhaler Inhale 1 puff into the lungs every 6 (six) hours as needed.    ? amLODipine (NORVASC) 5 MG tablet Take 1 tablet (5 mg total) by mouth daily.   ? aspirin 81 MG chewable tablet Chew 81 mg by mouth daily as needed.  (Patient not taking: Reported on 07/21/2020) 01/01/2019: Takes every once in a while  ? chlorthalidone (HYGROTON) 25 MG tablet Take 1 tablet (25 mg total) by mouth daily.   ? Cholecalciferol 25 MCG (1000 UT) capsule Vitamin D3 25 mcg (1,000 unit) capsule ? 1 po qd   ? fluticasone (FLONASE) 50 MCG/ACT nasal spray Place 1 spray into both nostrils as needed.    ? gabapentin (NEURONTIN) 400 MG capsule TAKE 1 CAPSULE(400 MG) BY MOUTH TWICE DAILY   ? losartan (COZAAR) 100 MG tablet Take 1 tablet (100 mg total) by mouth daily.   ? meclizine (ANTIVERT) 25 MG tablet Take 0.5 tablets (12.5 mg total) by mouth every 8 (eight) hours as needed for dizziness.   ? Multiple Vitamins-Minerals (MULTIVITAMIN WOMEN 50+) TABS Take 1 tablet by mouth daily.    ? NEXIUM 20 MG capsule Take 1 capsule (20 mg total) by mouth daily.   ? potassium chloride (MICRO-K) 10 MEQ CR capsule TAKE 1 CAPSULE(10 MEQ) BY MOUTH DAILY. MAY OPEN CAPSULE AND SPRINKLE OVER SOFT FOOD OR. SWALLOW WHOLE   ? Spacer/Aero-Holding Chambers (AEROCHAMBER PLUS) inhaler  Use as instructed   ? [DISCONTINUED] fexofenadine (ALLEGRA ALLERGY) 180 MG tablet Take 180 mg by mouth as needed. Reported on 09/29/2015   ? ?No facility-administered encounter medications on file as of 07/12/2021.  ? ? ?Patient Active Problem List  ? Diagnosis Date Noted  ? Obesity (BMI 35.0-39.9 without comorbidity) 01/09/2019  ? Peripheral polyneuropathy 12/27/2017  ? Vestibular disorder 03/20/2017  ? CKD (chronic kidney disease), stage II 12/09/2016  ? Chronic venous insufficiency 11/25/2016  ? Bilateral lower  extremity edema 10/27/2016  ? Pre-diabetes 08/12/2016  ? Carotid bruit 02/09/2016  ? History of left breast cancer 02/09/2016  ? Long term current use of opiate analgesic 02/08/2016  ? Long term prescription opiate use 02/08/2016  ? Drug level above therapeutic range 02/08/2016  ? Chronic low back pain (Location of Tertiary source of pain) (Bilateral) (R>L) 02/08/2016  ? Chronic knee pain (Location of Secondary source of pain) (Bilateral) (L>R) 02/08/2016  ? History bilateral shoulder replacement 02/08/2016  ? Chronic hip pain (Location of Primary Source of Pain) (Bilateral) (R>L) 01/07/2016  ? Complete rupture of rotator cuff 01/07/2016  ? Hyperlipidemia 01/07/2016  ? Osteopenia 01/07/2016  ? Centrilobular emphysema ([place]) 07/21/2015  ? DDD (degenerative disc disease), lumbar 03/05/2015  ? Lumbar facet syndrome (Bilateral) (R>L) 03/05/2015  ? Sacroiliac joint dysfunction 03/05/2015  ? Osteoarthritis of hip (Bilateral) (R>L) 03/05/2015  ? DJD of shoulder 03/05/2015  ? Abnormal finding on mammography 03/03/2015  ? Back pain with radiation 03/03/2015  ? Major depression in full remission ([place]) 03/03/2015  ? Dry skin 03/03/2015  ? Hypokalemia 03/03/2015  ? Drug-induced myopathy 03/03/2015  ? Gastro-esophageal reflux disease without esophagitis 03/03/2015  ? Allergic rhinitis, seasonal 03/03/2015  ? Osteoarthritis of multiple joints 03/03/2015  ? Benign hypertension with CKD (chronic kidney disease), stage II 11/28/2014  ? Chronic pain 11/28/2014  ? Vitamin D deficiency 11/28/2014  ? Airway hyperreactivity 09/18/2013  ? Microscopic hematuria 12/20/2011  ? ? ?Conditions to be addressed/monitored:HTN, HLD, COPD, Depression, and Osteoarthritis ? ?Care Plan : RNCM: General Plan of Care (Adult) for Chronic Disease Management and Care Coordination Needs  ?Updates made by Vanita Ingles, RN since 07/12/2021 12:00 AM  ?  ? ?Problem: RNCM: Developement of Plan of Care For Chronic Disease Management (HTN, HLD, Depression, COPD,  Chronic pain- Athritis)   ?Priority: High  ?  ? ?Long-Range Goal: RNCM: Developement of Plan of Care For Chronic Disease Management (HTN, HLD, Depression, COPD,and Chronic Pain Arthritis)   ?Start Date: 03/01/2021  ?Expected End Date: 03/01/2022  ?Priority: High  ?Note:   ?Current Barriers:  ?Knowledge Deficits related to plan of care for management of HTN, HLD, COPD, and Depression: depressed mood, Chronic pain- OA ?anxiety  ?Chronic Disease Management support and education needs related to HTN, HLD, COPD, and Depression: depressed mood, Chronic pain, OA ?anxiety ?Lacks caregiver support.        ? ?RNCM Clinical Goal(s):  ?Patient will verbalize understanding of plan for management of HTN, HLD, COPD, OA, and Depression  as evidenced by following the plan of care, working with the CCM team, taking medications, and compliance with dietary restricitons ?take all medications exactly as prescribed and will call provider for medication related questions as evidenced by compliance with medications    ?attend all scheduled medical appointments: 08-23-2021 as evidenced by keeping appointments and calling the office for needed schedule changes         ?demonstrate a decrease in HTN, HLD, COPD, OA, and Depression exacerbations  as evidenced by  working with the CCM team to effectively manage health and well being  ?demonstrate ongoing self health care management ability for effective management of chronic conditions  as evidenced by working with the CCM team through collaboration with Consulting civil engineer, provider, and care team.  ? ?Interventions: ?1:1 collaboration with primary care provider regarding development and update of comprehensive plan of care as evidenced by provider attestation and co-signature ?Inter-disciplinary care team collaboration (see longitudinal plan of care) ?Evaluation of current treatment plan related to  self management and patient's adherence to plan as established by provider ? ? ?SDOH Barriers (Status:  Goal on Track (progressing): YES.) Long Term Goal  ?Patient interviewed and SDOH assessment performed ?       ?Patient interviewed and appropriate assessments performed ?Provided patient with information about re

## 2021-07-12 NOTE — Patient Instructions (Signed)
Visit Information ? ?Thank you for taking time to visit with me today. Please don't hesitate to contact me if I can be of assistance to you before our next scheduled telephone appointment. ? ?Following are the goals we discussed today:  ?RNCM Clinical Goal(s):  ?Patient will verbalize understanding of plan for management of HTN, HLD, COPD, OA, and Depression  as evidenced by following the plan of care, working with the CCM team, taking medications, and compliance with dietary restricitons ?take all medications exactly as prescribed and will call provider for medication related questions as evidenced by compliance with medications    ?attend all scheduled medical appointments: 08-23-2021 as evidenced by keeping appointments and calling the office for needed schedule changes         ?demonstrate a decrease in HTN, HLD, COPD, OA, and Depression exacerbations  as evidenced by working with the CCM team to effectively manage health and well being  ?demonstrate ongoing self health care management ability for effective management of chronic conditions  as evidenced by working with the CCM team through collaboration with Consulting civil engineer, provider, and care team.  ?  ?Interventions: ?1:1 collaboration with primary care provider regarding development and update of comprehensive plan of care as evidenced by provider attestation and co-signature ?Inter-disciplinary care team collaboration (see longitudinal plan of care) ?Evaluation of current treatment plan related to  self management and patient's adherence to plan as established by provider ?  ?  ?SDOH Barriers (Status: Goal on Track (progressing): YES.) Long Term Goal  ?Patient interviewed and SDOH assessment performed ?       ?Patient interviewed and appropriate assessments performed ?Provided patient with information about resources available and care guides to help with any changes in SDOH ?Discussed plans with patient for ongoing care management follow up and provided  patient with direct contact information for care management team ?Advised patient to call the office for changes in SDOH, questions or concerns ?  ?  ?  ?COPD: (Status: Goal on Track (progressing): YES.) Long Term Goal  ?Reviewed medications with patient, including use of prescribed maintenance and rescue inhalers, and provided instruction on medication management and the importance of adherence. 03-22-2021: The patient states that she is taking her medications as prescribed. Is using her inhalers when needed and has no concerns with medications at this time. Has upcoming appointment with pharmacy next week. 05-24-2021: The patient is having some issues with her breathing but not bad. Some is in part to the changes in the weather. She states she does well when she is resting but she admits when she is moving around she gets short of breath. She denies any acute distress today. Is taking medications as prescribed. Will continue to monitor.  07-12-2021: The patient is having more issues with her breathing. States sometimes she feels like she is "smothering". She had the paramedics come out yesterday and they checked her out. She said she did not know if it was the weather changes or what. She sometimes has to use her oxygen during the day but normally just uses it at night. She says that she can tell it is getting worse. Ask the patient if she felt like she needed to see the pcp sooner. The patient states she did not think so. Education provided. Encouraged the patient to call the pulmonary provider if she continues to see no improvement.  ?Provided patient with basic written and verbal COPD education on self care/management/and exacerbation prevention ?Advised patient to track and manage COPD  triggers. 03-22-2021: The patient states she is stable. She stays in her apartment most of the time as she gets really "give out" when going out and walking, she just can't do that like she use to do it. She knows things that trigger  her COPD. COPD is stable at this time. 05-24-2021: Review of triggers that can cause exacerbation. The patient  is aware of factors that cause issues. She stays in and does not expose herself to others outside of her apartment unless she needs to go out. She states she is watching for weather changes and her family if they are sick around her. She is going to the specialist today to see about her arthritis pain and she states she will be mindful of others around her. 07-12-2021: Review of triggers with the patient. The patient feels sometimes the weather impacts and changes her breathing. She states "sometimes I think I need to die to get better". The patient feels she is just existing most days. Empathetic listening and support given. ?Provided written and verbal instructions on pursed lip breathing and utilized returned demonstration as teach back ?Provided instruction about proper use of medications used for management of COPD including inhalers. 07-12-2021: Review and the patient has her inhalers and knows how to use effectively.  ?Advised patient to self assesses COPD action plan zone and make appointment with provider if in the yellow zone for 48 hours without improvement ?Advised patient to engage in light exercise as tolerated 3-5 days a week to aid in the the management of COPD ?Provided education about and advised patient to utilize infection prevention strategies to reduce risk of respiratory infection. 05-24-2021: The patient is aware of triggers that cause exacerbation of COPD. Is mindful of risk of infection and monitors who comes to visit her. Review of safety precautions, high risk for infection during this season, and monitoring for illness in others who are around her. 07-12-2021: Review of triggers that can cause exacerbation of COPD. She felt yesterday she could just not get her breath. She coughs but cannot get anything up. Discussed making sure she drinks plenty of water to help thin secretions. The  patient states that she is trying to drink more water. Education and support given.  ?Discussed the importance of adequate rest and management of fatigue with COPD. 07-12-2021: The patient states that she does not do anything but lay on the couch and watch TV. She states that sometimes she just does not want to get out of bed. She does pace her activity.  ?Screening for signs and symptoms of depression related to chronic disease state  ?Assessed social determinant of health barriers. 05-24-2021: The patient has no new needs at this time. Has eye exam coming up on 06-10-2021 and will see the provider then. They are monitoring for a cataract on her left eye. She denies any acute findings, or changes in her vision.  ?  ?Depression   (Status: Goal on Track (progressing): YES.) Long Term Goal  ?Evaluation of current treatment plan related to Depression and social isolation  , Mental Health Concerns  self-management and patient's adherence to plan as established by provider. 05-24-2021: The patient deals with a lot of loneliness and is the only living sibling out of 5 children. She states that she does not know why God is allowing her to  hang around. She states that she is waiting around to "die". The patient denies SI or hurting herself. She states getting old is not for "sissy's". She  enjoys outreaches with the CCM team and is thankful for the care and concern she is given. She states that she eats "too well" and is a "fat old woman".  She does not do a lot of activity and watches a lot of TV. The patient is going out today with her daughter in law to the orthopedic specialist to see about the OA pain she is experiencing. Reflective listening, emotional support and eduction provided. Will continue to monitor for changes. 07-12-2021: The patient is not in the best of moods today. She states that she just does not feel good. She states that it is getting harder and harder for her to get motivated to do anything. She states that  she is just getting old and keeps hanging around. Encouraged the patient to express her feelings and get out her frustrations. She talks about her declining health and how hard it is for her. Education an

## 2021-07-16 DIAGNOSIS — F3342 Major depressive disorder, recurrent, in full remission: Secondary | ICD-10-CM | POA: Diagnosis not present

## 2021-07-16 DIAGNOSIS — J449 Chronic obstructive pulmonary disease, unspecified: Secondary | ICD-10-CM

## 2021-07-16 DIAGNOSIS — M159 Polyosteoarthritis, unspecified: Secondary | ICD-10-CM

## 2021-07-16 DIAGNOSIS — N182 Chronic kidney disease, stage 2 (mild): Secondary | ICD-10-CM

## 2021-07-16 DIAGNOSIS — I129 Hypertensive chronic kidney disease with stage 1 through stage 4 chronic kidney disease, or unspecified chronic kidney disease: Secondary | ICD-10-CM | POA: Diagnosis not present

## 2021-07-16 DIAGNOSIS — I10 Essential (primary) hypertension: Secondary | ICD-10-CM

## 2021-07-16 DIAGNOSIS — E782 Mixed hyperlipidemia: Secondary | ICD-10-CM | POA: Diagnosis not present

## 2021-07-16 DIAGNOSIS — J432 Centrilobular emphysema: Secondary | ICD-10-CM | POA: Diagnosis not present

## 2021-07-21 DIAGNOSIS — J449 Chronic obstructive pulmonary disease, unspecified: Secondary | ICD-10-CM | POA: Diagnosis not present

## 2021-07-26 DIAGNOSIS — J449 Chronic obstructive pulmonary disease, unspecified: Secondary | ICD-10-CM | POA: Diagnosis not present

## 2021-07-26 DIAGNOSIS — G4734 Idiopathic sleep related nonobstructive alveolar hypoventilation: Secondary | ICD-10-CM | POA: Diagnosis not present

## 2021-07-26 DIAGNOSIS — R0609 Other forms of dyspnea: Secondary | ICD-10-CM | POA: Diagnosis not present

## 2021-07-27 ENCOUNTER — Ambulatory Visit: Payer: Medicare Other

## 2021-07-27 ENCOUNTER — Ambulatory Visit (INDEPENDENT_AMBULATORY_CARE_PROVIDER_SITE_OTHER): Payer: Medicare Other

## 2021-07-27 VITALS — BP 139/75 | Ht 64.0 in | Wt 209.0 lb

## 2021-07-27 DIAGNOSIS — Z0000 Encounter for general adult medical examination without abnormal findings: Secondary | ICD-10-CM

## 2021-07-27 NOTE — Progress Notes (Signed)
? ?I connected with Candace Tapia today by telephone and verified that I am speaking with the correct person using two identifiers. ?Location patient: home ?Location provider: work ?Persons participating in the virtual visit: Candace Tapia, [place], [place] ?  ?I discussed the limitations, risks, security and privacy concerns of performing an evaluation and management service by telephone and the availability of in person appointments. I also discussed with the patient that there may be a patient responsible charge related to this service. The patient expressed understanding and verbally consented to this telephonic visit.  ?  ? ?Subjective:  ? Candace Tapia is a 86 y.o. female who presents for Medicare Annual (Subsequent) preventive examination. ? ?Review of Systems    ?Per HPI unless specifically indicated below ?  ? ?   ?Objective:  ?  ?Today's Vitals  ? 07/27/21 1521 07/27/21 1524  ?BP: 139/75   ?Weight: 209 lb (94.8 kg)   ?Height: '5\' 4"'$  (1.626 m)   ?PainSc:  0-No pain  ? ?Body mass index is 35.87 kg/m?. ? ? ?  07/21/2020  ?  3:24 PM 08/18/2019  ?  9:18 AM 01/01/2019  ?  3:28 PM 10/03/2018  ? 10:13 AM 10/02/2018  ?  6:15 PM 01/15/2018  ?  7:13 AM 12/26/2017  ?  2:57 PM  ?Advanced Directives  ?Does Patient Have a Medical Advance Directive? Yes Yes Yes Yes No Yes Yes  ?Type of Paramedic of [place];Living will [place];Living will Living will;Healthcare Power of [place];Living will  ?Copy of [place] in Chart? No - copy requested  No - copy requested    No - copy requested  ?Would patient like information on creating a medical advance directive?     No - Patient declined    ? ? ?Current Medications (verified) ?Outpatient Encounter Medications as of 07/27/2021  ?Medication Sig  ? acetaminophen (TYLENOL) 650 MG CR tablet Take 650 mg by mouth every 8 (eight) hours  as needed for pain.  ? albuterol (VENTOLIN HFA) 108 (90 Base) MCG/ACT inhaler Inhale 1 puff into the lungs every 6 (six) hours as needed.   ? amLODipine (NORVASC) 5 MG tablet Take 1 tablet (5 mg total) by mouth daily.  ? aspirin 81 MG chewable tablet Chew 81 mg by mouth daily as needed.  ? chlorthalidone (HYGROTON) 25 MG tablet Take 1 tablet (25 mg total) by mouth daily.  ? Cholecalciferol 25 MCG (1000 UT) capsule Vitamin D3 25 mcg (1,000 unit) capsule ? 1 po qd  ? fluticasone (FLONASE) 50 MCG/ACT nasal spray Place 1 spray into both nostrils as needed.   ? gabapentin (NEURONTIN) 400 MG capsule TAKE 1 CAPSULE(400 MG) BY MOUTH TWICE DAILY  ? losartan (COZAAR) 100 MG tablet Take 1 tablet (100 mg total) by mouth daily.  ? meclizine (ANTIVERT) 25 MG tablet Take 0.5 tablets (12.5 mg total) by mouth every 8 (eight) hours as needed for dizziness.  ? Multiple Vitamins-Minerals (MULTIVITAMIN WOMEN 50+) TABS Take 1 tablet by mouth daily.   ? NEXIUM 20 MG capsule Take 1 capsule (20 mg total) by mouth daily.  ? potassium chloride (MICRO-K) 10 MEQ CR capsule TAKE 1 CAPSULE(10 MEQ) BY MOUTH DAILY. MAY OPEN CAPSULE AND SPRINKLE OVER SOFT FOOD OR. SWALLOW WHOLE  ? Spacer/Aero-Holding Chambers (AEROCHAMBER PLUS) inhaler Use as instructed  ? [DISCONTINUED] fexofenadine (ALLEGRA ALLERGY) 180 MG tablet Take 180 mg by  mouth as needed. Reported on 09/29/2015  ? ?No facility-administered encounter medications on file as of 07/27/2021.  ? ? ?Allergies (verified) ?Tramadol hcl, Statins, Penicillin g, Penicillins, and Sulfa antibiotics  ? ?History: ?Past Medical History:  ?Diagnosis Date  ? Allergy   ? Arthritis   ? Breast cancer ([place]) 2014  ? radiation and taking exemestane  ? Cancer of left breast ([place]) 10/27/2014  ? GERD (gastroesophageal reflux disease)   ? H/O: hysterectomy   ? History of lumpectomy 2014  ? LT breast  ? ?Past Surgical History:  ?Procedure Laterality Date  ? ABDOMINAL HYSTERECTOMY    ? BREAST LUMPECTOMY Left 2014  ? BREAST  SURGERY Left 2014  ? JOINT REPLACEMENT    ? LYMPH NODE DISSECTION    ? neck  ? TOTAL SHOULDER REPLACEMENT Bilateral 2010 and 2013  ? ?Family History  ?Problem Relation Age of Onset  ? Breast cancer Mother 90  ? Diabetes Mother   ? Kidney disease Father   ? Diabetes Father   ? Breast cancer Sister 32  ? ?Social History  ? ?Socioeconomic History  ? Marital status: Widowed  ?  Spouse name: Not on file  ? Number of children: 2  ? Years of education: Not on file  ? Highest education level: 8th grade  ?Occupational History  ? Occupation: Retired  ?Tobacco Use  ? Smoking status: Former  ?  Packs/day: 2.00  ?  Years: 40.00  ?  Pack years: 80.00  ?  Types: Cigarettes  ?  Quit date: 04/19/1993  ?  Years since quitting: 28.2  ? Smokeless tobacco: Never  ?Vaping Use  ? Vaping Use: Not on file  ?Substance and Sexual Activity  ? Alcohol use: No  ?  Alcohol/week: 0.0 standard drinks  ? Drug use: No  ? Sexual activity: Not Currently  ?Other Topics Concern  ? Not on file  ?Social History Narrative  ? Not on file  ? ?Social Determinants of Health  ? ?Financial Resource Strain: Low Risk   ? Difficulty of Paying Living Expenses: Not hard at all  ?Food Insecurity: No Food Insecurity  ? Worried About Charity fundraiser in the Last Year: Never true  ? Ran Out of Food in the Last Year: Never true  ?Transportation Needs: No Transportation Needs  ? Lack of Transportation (Medical): No  ? Lack of Transportation (Non-Medical): No  ?Physical Activity: Inactive  ? Days of Exercise per Week: 0 days  ? Minutes of Exercise per Session: 0 min  ?Stress: No Stress Concern Present  ? Feeling of Stress : Not at all  ?Social Connections: Socially Isolated  ? Frequency of Communication with Friends and Family: More than three times a week  ? Frequency of Social Gatherings with Friends and Family: Twice a week  ? Attends Religious Services: Never  ? Active Member of Clubs or Organizations: No  ? Attends Archivist Meetings: Never  ? Marital  Status: Widowed  ? ? ?Tobacco Counseling ?Counseling given: Not Answered ? ? ?Clinical Intake: ? ?Pre-visit preparation completed: No ? ?Pain : No/denies pain ?Pain Score: 0-No pain ? ?  ? ?Nutritional Status: BMI > 30  Obese ?Nutritional Risks: Nausea/ vomitting/ diarrhea ?Diabetes: No ? ?  ? ?Diabetic? ? ?Interpreter Needed?: No ? ?Information entered by :: Donnie Mesa, CMA ? ? ?Activities of Daily Living ? ?  07/27/2021  ?  3:30 PM  ?In your present state of health, do you have any difficulty performing the  following activities:  ?Hearing? 1  ?Vision? 1  ?Difficulty concentrating or making decisions? 1  ?Walking or climbing stairs? 1  ?Dressing or bathing? 1  ?Doing errands, shopping? 1  ? ? ?Patient Care Team: ?Olin Hauser, DO as PCP - General (Family Medicine) ?Erby Pian, MD as Referring Physician (Specialist) ?Lequita Asal, MD (Inactive) as Referring Physician (Hematology and Oncology) ?Delles, Virl Diamond, RPH-CPP as Pharmacist ?Vanita Ingles, RN as Case Manager (General Practice) ?Rebekah Chesterfield, LCSW as Education officer, museum (Licensed Holiday representative) ?Cheyenne Adas, MD    [facility]  ?[place] ?Ruthy Dick, MD  Dentist  ? ?Indicate any recent Medical Services you may have received from other than [facility] providers in the past year (date may be approximate). ?No hospitalization in the past 12 mths noted. ?  ?Assessment:  ? This is a routine wellness examination for Candace Tapia. ? ?Hearing/Vision screen ?No results found. ? ?Dietary issues and exercise activities discussed: ?Current Exercise Habits: Home exercise routine, Type of exercise: stretching, Time (Minutes): 10, Frequency (Times/Week): 7, Weekly Exercise (Minutes/Week): 70, Intensity: Mild, Exercise limited by: orthopedic condition(s);respiratory conditions(s) ? ? Goals Addressed   ? ?  ?  ?  ?  ? This Visit's Progress  ?  Weight (lb) < 200 lb (90.7 kg)     ? ?  ? ?Depression Screen ? ?  07/27/2021  ?  3:27  PM 02/23/2021  ?  3:48 PM 09/21/2020  ?  2:23 PM 07/21/2020  ?  3:25 PM 03/02/2020  ?  4:28 PM 10/16/2019  ?  2:17 PM 07/10/2019  ?  2:52 PM  ?PHQ 2/9 Scores  ?PHQ - 2 Score '2 2 4 6 '$ 0 0 0  ?PHQ- 9 Score '4 7 7 '$ 13

## 2021-07-27 NOTE — Patient Instructions (Signed)

## 2021-08-13 ENCOUNTER — Other Ambulatory Visit: Payer: Self-pay | Admitting: Family Medicine

## 2021-08-13 DIAGNOSIS — G629 Polyneuropathy, unspecified: Secondary | ICD-10-CM

## 2021-08-13 NOTE — Telephone Encounter (Signed)
Requested Prescriptions  ?Pending Prescriptions Disp Refills  ?? gabapentin (NEURONTIN) 400 MG capsule [Pharmacy Med Name: GABAPENTIN '400MG'$  CAPSULES] 180 capsule 0  ?  Sig: TAKE 1 CAPSULE(400 MG) BY MOUTH TWICE DAILY  ?  ? Neurology: Anticonvulsants - gabapentin Passed - 08/13/2021 10:12 AM  ?  ?  Passed - Cr in normal range and within 360 days  ?  Creat  ?Date Value Ref Range Status  ?02/16/2021 0.82 0.60 - 0.95 mg/dL Final  ?   ?  ?  Passed - Completed PHQ-2 or PHQ-9 in the last 360 days  ?  ?  Passed - Valid encounter within last 12 months  ?  Recent Outpatient Visits   ?      ? 5 months ago Annual physical exam  ? [place], DO  ? 10 months ago Pre-diabetes  ? [place], DO  ? 1 year ago Annual physical exam  ? [place], DO  ? 1 year ago Pre-diabetes  ? [place], DO  ? 2 years ago Pre-diabetes  ? [place], DO  ?  ?  ?Future Appointments   ?        ? In 1 week [place] Devonne Doughty, DO [facility], [place]  ?  ? ?  ?  ?  ? ? ?

## 2021-08-20 DIAGNOSIS — J449 Chronic obstructive pulmonary disease, unspecified: Secondary | ICD-10-CM | POA: Diagnosis not present

## 2021-08-23 ENCOUNTER — Ambulatory Visit: Payer: Medicare Other

## 2021-08-23 ENCOUNTER — Ambulatory Visit (INDEPENDENT_AMBULATORY_CARE_PROVIDER_SITE_OTHER): Payer: Medicare Other | Admitting: Family Medicine

## 2021-08-23 ENCOUNTER — Encounter: Payer: Self-pay | Admitting: Family Medicine

## 2021-08-23 ENCOUNTER — Ambulatory Visit (INDEPENDENT_AMBULATORY_CARE_PROVIDER_SITE_OTHER): Payer: Medicare Other

## 2021-08-23 VITALS — BP 128/74 | HR 102 | Ht 64.0 in | Wt 206.4 lb

## 2021-08-23 DIAGNOSIS — F3342 Major depressive disorder, recurrent, in full remission: Secondary | ICD-10-CM

## 2021-08-23 DIAGNOSIS — R7303 Prediabetes: Secondary | ICD-10-CM

## 2021-08-23 DIAGNOSIS — J432 Centrilobular emphysema: Secondary | ICD-10-CM

## 2021-08-23 DIAGNOSIS — G8929 Other chronic pain: Secondary | ICD-10-CM

## 2021-08-23 DIAGNOSIS — E782 Mixed hyperlipidemia: Secondary | ICD-10-CM

## 2021-08-23 DIAGNOSIS — I129 Hypertensive chronic kidney disease with stage 1 through stage 4 chronic kidney disease, or unspecified chronic kidney disease: Secondary | ICD-10-CM

## 2021-08-23 DIAGNOSIS — G894 Chronic pain syndrome: Secondary | ICD-10-CM

## 2021-08-23 DIAGNOSIS — J449 Chronic obstructive pulmonary disease, unspecified: Secondary | ICD-10-CM

## 2021-08-23 DIAGNOSIS — M160 Bilateral primary osteoarthritis of hip: Secondary | ICD-10-CM

## 2021-08-23 LAB — POCT GLYCOSYLATED HEMOGLOBIN (HGB A1C): Hemoglobin A1C: 5.6 % (ref 4.0–5.6)

## 2021-08-23 NOTE — Patient Instructions (Addendum)
Thank you for coming to the office today. ? ?May have Coffee in morning and 1 mountain dew per day, and rest should be water. ? ?Try to limit certain foods. Such as dairy. May do trial of exclusion diet. ? ?Try Probiotic to help digestion regularly. ? ?Try to limit mucinex all the time, can take it 1-2 weeks at a time then pause for a week or more, too much mucinex can trigger you to keep coughing. ? ?Continue w Dr Raul Del. ? ?Please schedule a Follow-up Appointment to: Return in about 6 months (around 02/23/2022) for 6 month fasting lab only then 1 week later Annual Physical. ? ?If you have any other questions or concerns, please feel free to call the office or send a message through [place]. You may also schedule an earlier appointment if necessary. ? ?Additionally, you may be receiving a survey about your experience at our office within a few days to 1 week by e-mail or mail. We value your feedback. ? ?Nobie Putnam, DO ?[place] ?

## 2021-08-23 NOTE — Patient Instructions (Signed)
Visit Information ? ?Thank you for taking time to visit with me today. Please don't hesitate to contact me if I can be of assistance to you before our next scheduled telephone appointment. ? ?Following are the goals we discussed today:  ?RNCM Clinical Goal(s):  ?Patient will verbalize understanding of plan for management of HTN, HLD, COPD, OA, and Depression  as evidenced by following the plan of care, working with the CCM team, taking medications, and compliance with dietary restricitons ?take all medications exactly as prescribed and will call provider for medication related questions as evidenced by compliance with medications    ?attend all scheduled medical appointments:saw pcp today,  08-23-2021, keeps appointments  as evidenced by keeping appointments and calling the office for needed schedule changes         ?demonstrate a decrease in HTN, HLD, COPD, OA, and Depression exacerbations  as evidenced by working with the CCM team to effectively manage health and well being  ?demonstrate ongoing self health care management ability for effective management of chronic conditions  as evidenced by working with the CCM team through collaboration with Consulting civil engineer, provider, and care team.  ?  ?Interventions: ?1:1 collaboration with primary care provider regarding development and update of comprehensive plan of care as evidenced by provider attestation and co-signature ?Inter-disciplinary care team collaboration (see longitudinal plan of care) ?Evaluation of current treatment plan related to  self management and patient's adherence to plan as established by provider ?  ?  ?SDOH Barriers (Status: Goal on Track (progressing): YES.) Long Term Goal  ?Patient interviewed and SDOH assessment performed ?       ?Patient interviewed and appropriate assessments performed ?Provided patient with information about resources available and care guides to help with any changes in SDOH ?Discussed plans with patient for ongoing care  management follow up and provided patient with direct contact information for care management team ?Advised patient to call the office for changes in SDOH, questions or concerns ?  ?  ?  ?COPD: (Status: Goal on Track (progressing): YES.) Long Term Goal  ?Reviewed medications with patient, including use of prescribed maintenance and rescue inhalers, and provided instruction on medication management and the importance of adherence. 03-22-2021: The patient states that she is taking her medications as prescribed. Is using her inhalers when needed and has no concerns with medications at this time. Has upcoming appointment with pharmacy next week. 05-24-2021: The patient is having some issues with her breathing but not bad. Some is in part to the changes in the weather. She states she does well when she is resting but she admits when she is moving around she gets short of breath. She denies any acute distress today. Is taking medications as prescribed. Will continue to monitor.  07-12-2021: The patient is having more issues with her breathing. States sometimes she feels like she is "smothering". She had the paramedics come out yesterday and they checked her out. She said she did not know if it was the weather changes or what. She sometimes has to use her oxygen during the day but normally just uses it at night. She says that she can tell it is getting worse. Ask the patient if she felt like she needed to see the pcp sooner. The patient states she did not think so. Education provided. Encouraged the patient to call the pulmonary provider if she continues to see no improvement. 08-23-2021: The patient states she can tell her breathing is getting worse over time. She says  when the weather is bad her breathing is worse. Discussed pacing activities and resting when needed.  ?Provided patient with basic written and verbal COPD education on self care/management/and exacerbation prevention ?Advised patient to track and manage COPD  triggers. 03-22-2021: The patient states she is stable. She stays in her apartment most of the time as she gets really "give out" when going out and walking, she just can't do that like she use to do it. She knows things that trigger her COPD. COPD is stable at this time. 05-24-2021: Review of triggers that can cause exacerbation. The patient  is aware of factors that cause issues. She stays in and does not expose herself to others outside of her apartment unless she needs to go out. She states she is watching for weather changes and her family if they are sick around her. She is going to the specialist today to see about her arthritis pain and she states she will be mindful of others around her. 08-23-2021: Review of triggers with the patient. The patient feels sometimes the weather impacts and changes her breathing. She states "sometimes I think I need to die to get better". The patient feels she is just existing most days. Empathetic listening and support given. ?Provided written and verbal instructions on pursed lip breathing and utilized returned demonstration as teach back ?Provided instruction about proper use of medications used for management of COPD including inhalers. 08-23-2021: Review and the patient has her inhalers and knows how to use effectively.  ?Advised patient to self assesses COPD action plan zone and make appointment with provider if in the yellow zone for 48 hours without improvement ?Advised patient to engage in light exercise as tolerated 3-5 days a week to aid in the the management of COPD ?Provided education about and advised patient to utilize infection prevention strategies to reduce risk of respiratory infection. 05-24-2021: The patient is aware of triggers that cause exacerbation of COPD. Is mindful of risk of infection and monitors who comes to visit her. Review of safety precautions, high risk for infection during this season, and monitoring for illness in others who are around her.  07-12-2021: Review of triggers that can cause exacerbation of COPD. She felt yesterday she could just not get her breath. She coughs but cannot get anything up. Discussed making sure she drinks plenty of water to help thin secretions. The patient states that she is trying to drink more water. Education and support given. 08-23-2021: Review of triggers and monitoring for factors that increase her risk of exacerbation. The patient states that she is mindful of things that cause additional problems for her COPD.  ?Discussed the importance of adequate rest and management of fatigue with COPD. 5-8 -2023: The patient states that she does not do anything but lay on the couch and watch TV. She states that sometimes she just does not want to get out of bed. She does pace her activity.  ?Screening for signs and symptoms of depression related to chronic disease state  ?Assessed social determinant of health barriers. 05-24-2021: The patient has no new needs at this time. Has eye exam coming up on 06-10-2021 and will see the provider then. They are monitoring for a cataract on her left eye. She denies any acute findings, or changes in her vision.  ?  ?Depression   (Status: Goal on Track (progressing): YES.) Long Term Goal  ?Evaluation of current treatment plan related to Depression and social isolation  , Mental Health Concerns  self-management  and patient's adherence to plan as established by provider. 05-24-2021: The patient deals with a lot of loneliness and is the only living sibling out of 5 children. She states that she does not know why God is allowing her to  hang around. She states that she is waiting around to "die". The patient denies SI or hurting herself. She states getting old is not for "sissy's". She enjoys outreaches with the CCM team and is thankful for the care and concern she is given. She states that she eats "too well" and is a "fat old woman".  She does not do a lot of activity and watches a lot of TV. The patient  is going out today with her daughter in law to the orthopedic specialist to see about the OA pain she is experiencing. Reflective listening, emotional support and eduction provided. Will continue to monitor for cha

## 2021-08-23 NOTE — Chronic Care Management (AMB) (Signed)
?Chronic Care Management  ? ?CCM RN Visit Note ? ?08/23/2021 ?Name: Candace Tapia MRN: [id number] DOB: [birth year] ? ?Subjective: ?Candace Tapia is a 86 y.o. year old female who is a primary care patient of Olin Hauser, DO. The care management team was consulted for assistance with disease management and care coordination needs.   ? ?Engaged with patient face to face for follow up visit in response to provider referral for case management and/or care coordination services.  ? ?Consent to Services:  ?The patient was given information about Chronic Care Management services, agreed to services, and gave verbal consent prior to initiation of services.  Please see initial visit note for detailed documentation.  ? ?Patient agreed to services and verbal consent obtained.  ? ?Assessment: Review of patient past medical history, allergies, medications, health status, including review of consultants reports, laboratory and other test data, was performed as part of comprehensive evaluation and provision of chronic care management services.  ? ?SDOH (Social Determinants of Health) assessments and interventions performed:   ? ?CCM Care Plan ? ?Allergies  ?Allergen Reactions  ? Tramadol Hcl Itching  ?  Per pt it caused me to itch all over  ? Statins   ?  Not sure what exact med was; just made her feel bad (can't elaborate)  ? Penicillin G Rash  ? Penicillins Rash  ?  Has patient had a PCN reaction causing immediate rash, facial/tongue/throat swelling, SOB or lightheadedness with hypotension: No ?Has patient had a PCN reaction causing severe rash involving mucus membranes or skin necrosis: No ?Has patient had a PCN reaction that required hospitalization: No ?Has patient had a PCN reaction occurring within the last 10 years: No ?If all of the above answers are "NO", then may proceed with Cephalosporin use.  ? Sulfa Antibiotics Other (See Comments) and Rash  ? ? ?Outpatient Encounter Medications as of 08/23/2021   ?Medication Sig Note  ? acetaminophen (TYLENOL) 650 MG CR tablet Take 650 mg by mouth every 8 (eight) hours as needed for pain.   ? albuterol (VENTOLIN HFA) 108 (90 Base) MCG/ACT inhaler Inhale 1 puff into the lungs every 6 (six) hours as needed.    ? amLODipine (NORVASC) 5 MG tablet Take 1 tablet (5 mg total) by mouth daily.   ? aspirin 81 MG chewable tablet Chew 81 mg by mouth daily as needed. 01/01/2019: Takes every once in a while  ? chlorthalidone (HYGROTON) 25 MG tablet Take 1 tablet (25 mg total) by mouth daily.   ? Cholecalciferol 25 MCG (1000 UT) capsule Vitamin D3 25 mcg (1,000 unit) capsule ? 1 po qd   ? fluticasone (FLONASE) 50 MCG/ACT nasal spray Place 1 spray into both nostrils as needed.    ? gabapentin (NEURONTIN) 400 MG capsule TAKE 1 CAPSULE(400 MG) BY MOUTH TWICE DAILY   ? losartan (COZAAR) 100 MG tablet Take 1 tablet (100 mg total) by mouth daily.   ? meclizine (ANTIVERT) 25 MG tablet Take 0.5 tablets (12.5 mg total) by mouth every 8 (eight) hours as needed for dizziness.   ? Multiple Vitamins-Minerals (MULTIVITAMIN WOMEN 50+) TABS Take 1 tablet by mouth daily.    ? NEXIUM 20 MG capsule Take 1 capsule (20 mg total) by mouth daily.   ? potassium chloride (MICRO-K) 10 MEQ CR capsule TAKE 1 CAPSULE(10 MEQ) BY MOUTH DAILY. MAY OPEN CAPSULE AND SPRINKLE OVER SOFT FOOD OR. SWALLOW WHOLE   ? Spacer/Aero-Holding Chambers (AEROCHAMBER PLUS) inhaler Use as instructed   ? [  DISCONTINUED] fexofenadine (ALLEGRA ALLERGY) 180 MG tablet Take 180 mg by mouth as needed. Reported on 09/29/2015   ? ?No facility-administered encounter medications on file as of 08/23/2021.  ? ? ?Patient Active Problem List  ? Diagnosis Date Noted  ? Obesity (BMI 35.0-39.9 without comorbidity) 01/09/2019  ? Peripheral polyneuropathy 12/27/2017  ? Vestibular disorder 03/20/2017  ? CKD (chronic kidney disease), stage II 12/09/2016  ? Chronic venous insufficiency 11/25/2016  ? Bilateral lower extremity edema 10/27/2016  ? Pre-diabetes  08/12/2016  ? Carotid bruit 02/09/2016  ? History of left breast cancer 02/09/2016  ? Long term current use of opiate analgesic 02/08/2016  ? Long term prescription opiate use 02/08/2016  ? Drug level above therapeutic range 02/08/2016  ? Chronic low back pain (Location of Tertiary source of pain) (Bilateral) (R>L) 02/08/2016  ? Chronic knee pain (Location of Secondary source of pain) (Bilateral) (L>R) 02/08/2016  ? History bilateral shoulder replacement 02/08/2016  ? Chronic hip pain (Location of Primary Source of Pain) (Bilateral) (R>L) 01/07/2016  ? Complete rupture of rotator cuff 01/07/2016  ? Hyperlipidemia 01/07/2016  ? Osteopenia 01/07/2016  ? Centrilobular emphysema ([place]) 07/21/2015  ? DDD (degenerative disc disease), lumbar 03/05/2015  ? Lumbar facet syndrome (Bilateral) (R>L) 03/05/2015  ? Sacroiliac joint dysfunction 03/05/2015  ? Osteoarthritis of hip (Bilateral) (R>L) 03/05/2015  ? DJD of shoulder 03/05/2015  ? Abnormal finding on mammography 03/03/2015  ? Back pain with radiation 03/03/2015  ? Major depression in full remission ([place]) 03/03/2015  ? Dry skin 03/03/2015  ? Hypokalemia 03/03/2015  ? Drug-induced myopathy 03/03/2015  ? Gastro-esophageal reflux disease without esophagitis 03/03/2015  ? Allergic rhinitis, seasonal 03/03/2015  ? Osteoarthritis of multiple joints 03/03/2015  ? Benign hypertension with CKD (chronic kidney disease), stage II 11/28/2014  ? Chronic pain 11/28/2014  ? Vitamin D deficiency 11/28/2014  ? Airway hyperreactivity 09/18/2013  ? Microscopic hematuria 12/20/2011  ? ? ?Conditions to be addressed/monitored:HTN, HLD, COPD, Depression, and Chronic pain ? ?Care Plan : RNCM: General Plan of Care (Adult) for Chronic Disease Management and Care Coordination Needs  ?Updates made by Vanita Ingles, RN since 08/23/2021 12:00 AM  ?  ? ?Problem: RNCM: Developement of Plan of Care For Chronic Disease Management (HTN, HLD, Depression, COPD, Chronic pain- [place])   ?Priority: High  ?   ? ?Long-Range Goal: RNCM: Developement of Plan of Care For Chronic Disease Management (HTN, HLD, Depression, COPD,and Chronic Pain [place])   ?Start Date: 03/01/2021  ?Expected End Date: 03/01/2022  ?Priority: High  ?Note:   ?Current Barriers:  ?Knowledge Deficits related to plan of care for management of HTN, HLD, COPD, and Depression: depressed mood, Chronic pain- OA ?anxiety  ?Chronic Disease Management support and education needs related to HTN, HLD, COPD, and Depression: depressed mood, Chronic pain, OA ?anxiety ?Lacks caregiver support.        ? ?RNCM Clinical Goal(s):  ?Patient will verbalize understanding of plan for management of HTN, HLD, COPD, OA, and Depression  as evidenced by following the plan of care, working with the CCM team, taking medications, and compliance with dietary restricitons ?take all medications exactly as prescribed and will call provider for medication related questions as evidenced by compliance with medications    ?attend all scheduled medical appointments:saw pcp today,  08-23-2021, keeps appointments  as evidenced by keeping appointments and calling the office for needed schedule changes         ?demonstrate a decrease in HTN, HLD, COPD, OA, and Depression exacerbations  as evidenced  by working with the CCM team to effectively manage health and well being  ?demonstrate ongoing self health care management ability for effective management of chronic conditions  as evidenced by working with the CCM team through collaboration with Consulting civil engineer, provider, and care team.  ? ?Interventions: ?1:1 collaboration with primary care provider regarding development and update of comprehensive plan of care as evidenced by provider attestation and co-signature ?Inter-disciplinary care team collaboration (see longitudinal plan of care) ?Evaluation of current treatment plan related to  self management and patient's adherence to plan as established by provider ? ? ?SDOH Barriers (Status: Goal on  Track (progressing): YES.) Long Term Goal  ?Patient interviewed and SDOH assessment performed ?       ?Patient interviewed and appropriate assessments performed ?Provided patient with information about resources availa

## 2021-08-23 NOTE — Progress Notes (Signed)
? ?Subjective:  ? ? Patient ID: Candace Tapia, female    DOB: 08-Jun-1935, 86 y.o.   MRN: [id number] ? ?Candace Tapia is a 86 y.o. female presenting on 08/23/2021 for Pre-Diabetes ? ? ?HPI ? ?Here for Annual Physical and Lab Reivew. ? ?Abdominal Bloating / Functional GI ?Admits she may eat breakfast when not always hungry, she tries to avoid overeating. ?She states it is not linked to any one particular meal. ?- Not drinking any water, she drinks mostly mountain dew often. ?  ?Pre-Diabetes / obesity BMI >35 ?Reports no concerns. Last A1c trend 5.6 ?Doing well ?Meds: Never on meds ?Limited diet and exercise, admits difficulty exercising due to back hip pain arthritis ?Denies hypoglycemia, polyuria, visual changes, numbness or tingling. ?  ?CHRONIC HTN / Peripheral Edema / Hypokalemia ?Improved BP control and edema ?Current Meds - Losartan '100mg'$  daily, Chlorthalidone '25mg'$  daily, Amlodipine '5mg'$  daily   ?Reports good compliance, took meds today. ?Taking Potassium 51mq daily now ?  ?Osteoarthritis, multiple joints ?Chronic joint pain multiple joints. Prior history of shoulder replacement surgery in past ?She had been on Hydrocodone years ago and did well only short term. She takes Tylenol Ext STr '500mg'$  x 2 twice a day asking about taking additional dose. ?  ?Chronic Vestibular disorder ?Followed by [facility] ?She has had some dizziness episodes lightheadedness ?On Meclizine PRN ?- She has had some worsening dizziness at times. ?  ?Recurrent Depression, - in remission ?Reports she has prior history of some depressed mood but overall has managed to cope with it for long time and currently doing well. ?She says she is alone. She has limited family members around. She has some friends, one with alzheimer's dementia. ?Cannot tolerate medication, remains off ?   ?Centrilobular Emphysema (COPD) ?Followed by Dr FMinerva AreolaPulm ?On oxygen at night, nocturnal up to 2L ?Using inhalers Continue Trelegy ?She is doing well but  often can get short of breath with exertion, worse with mask ?Has some swelling. ? ? ? ?  07/27/2021  ?  3:27 PM 02/23/2021  ?  3:48 PM 09/21/2020  ?  2:23 PM  ?Depression screen PHQ 2/9  ?Decreased Interest '1 1 2  '$ ?Down, Depressed, Hopeless '1 1 2  '$ ?PHQ - 2 Score '2 2 4  '$ ?Altered sleeping 1 1 0  ?Tired, decreased energy '1 1 3  '$ ?Change in appetite 0 1 0  ?Feeling bad or failure about yourself  0 1 0  ?Trouble concentrating 0 1 0  ?Moving slowly or fidgety/restless 0 0 0  ?Suicidal thoughts 0 0   ?PHQ-9 Score '4 7 7  '$ ?Difficult doing work/chores Not difficult at all Not difficult at all Somewhat difficult  ? ? ?Social History  ? ?Tobacco Use  ? Smoking status: Former  ?  Packs/day: 2.00  ?  Years: 40.00  ?  Pack years: 80.00  ?  Types: Cigarettes  ?  Quit date: 04/19/1993  ?  Years since quitting: 28.3  ? Smokeless tobacco: Never  ?Substance Use Topics  ? Alcohol use: No  ?  Alcohol/week: 0.0 standard drinks  ? Drug use: No  ? ? ?Review of Systems ?Per HPI unless specifically indicated above ? ?   ?Objective:  ?  ?BP 128/74   Pulse (!) 102   Ht '5\' 4"'$  (1.626 m)   Wt 206 lb 6.4 oz (93.6 kg)   SpO2 97%   BMI 35.43 kg/m?   ?Wt Readings from Last 3 Encounters:  ?08/23/21 206  lb 6.4 oz (93.6 kg)  ?07/27/21 209 lb (94.8 kg)  ?02/23/21 218 lb (98.9 kg)  ?  ?Physical Exam ?Vitals and nursing note reviewed.  ?Constitutional:   ?   General: She is not in acute distress. ?   Appearance: She is well-developed. She is not diaphoretic.  ?   Comments: Well-appearing, comfortable, cooperative  ?HENT:  ?   Head: Normocephalic and atraumatic.  ?Eyes:  ?   General:     ?   Right eye: No discharge.     ?   Left eye: No discharge.  ?   Conjunctiva/sclera: Conjunctivae normal.  ?   Pupils: Pupils are equal, round, and reactive to light.  ?Neck:  ?   Thyroid: No thyromegaly.  ?Cardiovascular:  ?   Rate and Rhythm: Normal rate and regular rhythm.  ?   Pulses: Normal pulses.  ?   Heart sounds: Normal heart sounds. No murmur heard. ?Pulmonary:  ?    Effort: Pulmonary effort is normal. No respiratory distress.  ?   Breath sounds: Normal breath sounds. No wheezing or rales.  ?Abdominal:  ?   General: Bowel sounds are normal. There is no distension.  ?   Palpations: Abdomen is soft. There is no mass.  ?   Tenderness: There is no abdominal tenderness.  ?Musculoskeletal:     ?   General: No tenderness. Normal range of motion.  ?   Cervical back: Normal range of motion and neck supple.  ?   Comments: Upper / Lower Extremities: ?- Normal muscle tone, strength bilateral upper extremities 5/5, lower extremities 5/5  ?Lymphadenopathy:  ?   Cervical: No cervical adenopathy.  ?Skin: ?   General: Skin is warm and dry.  ?   Findings: No erythema or rash.  ?Neurological:  ?   Mental Status: She is alert and oriented to person, place, and time.  ?   Comments: Distal sensation intact to light touch all extremities  ?Psychiatric:     ?   Mood and Affect: Mood normal.     ?   Behavior: Behavior normal.     ?   Thought Content: Thought content normal.  ?   Comments: Well groomed, good eye contact, normal speech and thoughts  ? ? ? ?Results for orders placed or performed in visit on 08/23/21  ?POCT glycosylated hemoglobin (Hb A1C)  ?Result Value Ref Range  ? Hemoglobin A1C 5.6 4.0 - 5.6 %  ? ?   ?Assessment & Plan:  ? ?Problem List Items Addressed This Visit   ? ? Pre-diabetes  ? Relevant Orders  ? POCT glycosylated hemoglobin (Hb A1C) (Completed)  ? Centrilobular emphysema ([place]) - Primary  ?  ?PreDM ?Doing very well with A1c 5.6 now ? ?Emphysema ?Followed by Dr Raul Del ?Continue inhalers ? ?May have Coffee in morning and 1 mountain dew per day, and rest should be water. ? ?Try to limit certain foods. Such as dairy. May do trial of exclusion diet. ? ?Try Probiotic to help digestion regularly. ? ?Try to limit mucinex all the time, can take it 1-2 weeks at a time then pause for a week or more, too much mucinex can trigger you to keep coughing. ? ? ?No orders of the defined types  were placed in this encounter. ? ? ? ? ?Follow up plan: ?Return in about 6 months (around 02/23/2022) for 6 month fasting lab only then 1 week later Annual Physical. ? ?Nobie Putnam, DO ?[facility] ?[facility] ?08/23/2021, 1:38 PM ?

## 2021-08-26 ENCOUNTER — Emergency Department: Payer: Medicare Other

## 2021-08-26 ENCOUNTER — Emergency Department
Admission: EM | Admit: 2021-08-26 | Discharge: 2021-08-26 | Disposition: A | Discharge status: Home / Self Care | Payer: Medicare Other | Attending: Emergency Medicine | Admitting: Emergency Medicine

## 2021-08-26 ENCOUNTER — Other Ambulatory Visit: Payer: Self-pay

## 2021-08-26 DIAGNOSIS — D72829 Elevated white blood cell count, unspecified: Secondary | ICD-10-CM | POA: Diagnosis not present

## 2021-08-26 DIAGNOSIS — R1012 Left upper quadrant pain: Secondary | ICD-10-CM | POA: Insufficient documentation

## 2021-08-26 DIAGNOSIS — R1013 Epigastric pain: Secondary | ICD-10-CM | POA: Insufficient documentation

## 2021-08-26 DIAGNOSIS — R110 Nausea: Secondary | ICD-10-CM | POA: Diagnosis not present

## 2021-08-26 DIAGNOSIS — I10 Essential (primary) hypertension: Secondary | ICD-10-CM | POA: Diagnosis not present

## 2021-08-26 DIAGNOSIS — J449 Chronic obstructive pulmonary disease, unspecified: Secondary | ICD-10-CM | POA: Insufficient documentation

## 2021-08-26 DIAGNOSIS — R0789 Other chest pain: Secondary | ICD-10-CM | POA: Diagnosis not present

## 2021-08-26 DIAGNOSIS — R7401 Elevation of levels of liver transaminase levels: Secondary | ICD-10-CM | POA: Insufficient documentation

## 2021-08-26 DIAGNOSIS — R079 Chest pain, unspecified: Secondary | ICD-10-CM | POA: Diagnosis not present

## 2021-08-26 DIAGNOSIS — R109 Unspecified abdominal pain: Secondary | ICD-10-CM | POA: Diagnosis not present

## 2021-08-26 DIAGNOSIS — E876 Hypokalemia: Secondary | ICD-10-CM | POA: Diagnosis not present

## 2021-08-26 DIAGNOSIS — I700 Atherosclerosis of aorta: Secondary | ICD-10-CM | POA: Diagnosis not present

## 2021-08-26 LAB — HEPATIC FUNCTION PANEL
ALT: 31 U/L (ref 0–44)
AST: 57 U/L — ABNORMAL HIGH (ref 15–41)
Albumin: 4.1 g/dL (ref 3.5–5.0)
Alkaline Phosphatase: 60 U/L (ref 38–126)
Bilirubin, Direct: 0.3 mg/dL — ABNORMAL HIGH (ref 0.0–0.2)
Indirect Bilirubin: 0.4 mg/dL (ref 0.3–0.9)
Total Bilirubin: 0.7 mg/dL (ref 0.3–1.2)
Total Protein: 7.1 g/dL (ref 6.5–8.1)

## 2021-08-26 LAB — BASIC METABOLIC PANEL
Anion gap: 11 (ref 5–15)
BUN: 14 mg/dL (ref 8–23)
CO2: 26 mmol/L (ref 22–32)
Calcium: 9.4 mg/dL (ref 8.9–10.3)
Chloride: 99 mmol/L (ref 98–111)
Creatinine, Ser: 0.69 mg/dL (ref 0.44–1.00)
GFR, Estimated: >60 mL/min (ref >60)
Glucose, Bld: 125 mg/dL — ABNORMAL HIGH (ref 70–99)
Potassium: 3.5 mmol/L (ref 3.5–5.1)
Sodium: 136 mmol/L (ref 135–145)

## 2021-08-26 LAB — CBC
HCT: 41.2 % (ref 36.0–46.0)
Hemoglobin: 13.5 g/dL (ref 12.0–15.0)
MCH: 29.4 pg (ref 26.0–34.0)
MCHC: 32.8 g/dL (ref 30.0–36.0)
MCV: 89.8 fL (ref 80.0–100.0)
Platelets: 310 10*3/uL (ref 150–400)
RBC: 4.59 MIL/uL (ref 3.87–5.11)
RDW: 13.5 % (ref 11.5–15.5)
WBC: 12 10*3/uL — ABNORMAL HIGH (ref 4.0–10.5)
nRBC: 0 % (ref 0.0–0.2)

## 2021-08-26 LAB — TROPONIN I (HIGH SENSITIVITY)
Troponin I (High Sensitivity): 5 ng/L (ref <18)
Troponin I (High Sensitivity): 5 ng/L (ref <18)

## 2021-08-26 LAB — LIPASE, BLOOD: Lipase: 27 U/L (ref 11–51)

## 2021-08-26 MED ORDER — IOHEXOL 300 MG/ML  SOLN
100.0000 mL | Freq: Once | INTRAMUSCULAR | Status: AC | PRN
  Administered 2021-08-26: 100 mL via INTRAVENOUS

## 2021-08-26 NOTE — ED Provider Notes (Signed)
? ?[facility] ?Provider Note ? ? ? Event Date/Time  ? First MD Initiated Contact with Patient 08/26/21 1314   ?  (approximate) ? ? ?History  ? ?Chest Pain ? ? ?HPI ? ?Candace Tapia is a 86 y.o. female who according to primary care note from May 8 of this year has a history of abdominal bloating, prediabetes, obesity, chronic hypertension peripheral edema hypokalemia osteoarthritis, emphysema and vestibular disorder ? ?Patient is here with her son.  Both relate that for about roughly a few weeks to a month now when she gets up in the morning and eats breakfast she will notice pain in her upper abdomen usually located more in the left side than the right.  It will be present for an hour and associated with nausea.  She feels like it will also hurt some into her back when this occurs.  She has a history of COPD but denies any new problems with that.  She is not having "chest pain" she reports that its more like a pain in her upper abdomen that radiates some towards her chest but its more in the middle of her upper abdomen radiates to her back when it happens.  Last occurred this morning. ? ?Denies fevers or chills.  No cough or recent illness.  Has COPD uses her typical treatments at home, had a flareup a few weeks ago of that but has been under control. ? ?At the present time all of her pain and symptoms have resolved.  She reports this pattern for about a month now in the morning, does not happen every morning but it seems to happen mostly in the mornings and mostly after she eats a meal.  Does not have any sort of stuck sensation.  No vomiting ?  ?No pain or burning with urination.  No urinary symptoms.  No flank pains. ? ?Physical Exam  ? ?Triage Vital Signs: ?ED Triage Vitals  ?Enc Vitals Group  ?   BP 08/26/21 1056 (!) 152/61  ?   Pulse Rate 08/26/21 1056 87  ?   Resp 08/26/21 1056 18  ?   Temp 08/26/21 1056 97.6 ?F (36.4 ?C)  ?   Temp Source 08/26/21 1056 Oral  ?   SpO2 08/26/21 1056  93 %  ?   Weight --   ?   Height --   ?   Head Circumference --   ?   Peak Flow --   ?   Pain Score 08/26/21 1035 6  ?   Pain Loc --   ?   Pain Edu? --   ?   Excl. in [place]? --   ? ? ?Most recent vital signs: ?Vitals:  ? 08/26/21 1408 08/26/21 1408  ?BP:  (!) 149/63  ?Pulse: 77 79  ?Resp: (!) 26 18  ?Temp:    ?SpO2: 96% 99%  ? ? ? ?General: Awake, no distress.  ?CV:  Good peripheral perfusion.  Normal heart tones and rate ?Resp:  Normal effort.  Clear lung sounds.  No wheezing.  Speaks in full clear sentences.  Oxygen saturation 94 to 95% on room air ?Abd:  No distention.  Reports mild discomfort to palpation epigastrium, also slight in the left upper quadrant.  She reports the area of her pain is primarily noted in the left upper quadrant when is present, but is since gone away.  No lower abdominal pain in either quadrant.  Negative Percell Miller ?Other:  Trace bilateral lower extremity edema ? ? ?  ED Results / Procedures / Treatments  ? ?Labs ?(all labs ordered are listed, but only abnormal results are displayed) ?Labs Reviewed  ?BASIC METABOLIC PANEL - Abnormal; Notable for the following components:  ?    Result Value  ? Glucose, Bld 125 (*)   ? All other components within normal limits  ?CBC - Abnormal; Notable for the following components:  ? WBC 12.0 (*)   ? All other components within normal limits  ?HEPATIC FUNCTION PANEL - Abnormal; Notable for the following components:  ? AST 57 (*)   ? Bilirubin, Direct 0.3 (*)   ? All other components within normal limits  ?LIPASE, BLOOD  ?TROPONIN I (HIGH SENSITIVITY)  ?TROPONIN I (HIGH SENSITIVITY)  ? ? ? ?EKG ? ?Reviewed and interpreted by me at 11 AM ?Heart rate 80 ?QRS 110 ?QTc 450 ?Normal sinus rhythm, probable left ventricular hypertrophy.  No evidence of acute ischemia. ? ? ?RADIOLOGY ?Chest x-ray personally interpreted by me, hyperinflated, no acute abnormality. ? ?CT pending, Dr. Archie Balboa will follow-up ? ? ?PROCEDURES: ? ?Critical Care performed:  No ? ?Procedures ? ? ?MEDICATIONS ORDERED IN ED: ?Medications - No data to display ? ? ?IMPRESSION / MDM / ASSESSMENT AND PLAN / ED COURSE  ?I reviewed the triage vital signs and the nursing notes. ?             ?               ? ?Differential diagnosis includes, but is not limited to, epigastric etiologies, cholelithiasis cholecystitis, gastroparesis, obstruction, mass lesion, or other acute intra-abdominal etiologies pancreatitis mass, esophageal etiology esophageal spasm etc.  Very reassuring exam without peritonitis but only some mild focal epigastric tenderness at this time.  Seems to have a pattern of occurring in the morning and is postprandial in nature.  She reports an element of chest pain but seems to be more of a radiation and does not seem to be typical of ACS and its association in the morning with eating and resolution usually within an hour of eating. ? ?Denies that she is having chest pain.  EKG without ischemia, and first troponin normal.  Second troponin normal, and given the atypical nature suspected unlikely to be related to acute cardiopulmonary process.  No dyspnea.  Known COPD uses oxygen at night, currently at baseline without respiratory distress.  No pleuritic pain no clinical signs or symptoms such as tachycardia or new hypoxia to be suggestive of pulmonary embolism and symptomatology and consistent with PE given this presentation ? ?The patient is on the cardiac monitor to evaluate for evidence of arrhythmia and/or significant heart rate changes. ? ?Labs remarkable for mild leukocytosis.  Normal chemistry.  First troponin normal. ? ?Hepatic function panel notable for mildly elevated AST but otherwise normal lipase within normal limits ? ?Discussed with patient and son given the recurrent nature and epigastric location we will proceed with imaging including CT scan of the abdomen pelvis to evaluate for intra-abdominal pathology. ? ? ?----------------------------------------- ?3:35 PM on  08/26/2021 ?----------------------------------------- ?Ongoing care assigned to Dr. Archie Balboa, follow-up on reevaluation and imaging results from CT abdomen pelvis. ?  ? ? ?FINAL CLINICAL IMPRESSION(S) / ED DIAGNOSES  ? ?Final diagnoses:  ?Postprandial epigastric pain  ? ? ? ?Rx / DC Orders  ? ?ED Discharge Orders   ? ? None  ? ?  ? ? ? ?Note:  This document was prepared using Dragon voice recognition software and may include unintentional dictation errors. ?  ?Delman Kitten,  MD ?08/26/21 1535 ? ?

## 2021-08-26 NOTE — ED Triage Notes (Signed)
Pt comes with c/o CP that started after breakfast. Pt states nausea, back pain and belly pain. Pt states CP and back has now subsided. Pt states belly pain. ? ?Pt got 324 aspirin. ? ?BP-148/79 ?HR-81 ?95% RA, lungs clear ?CBG-133 ?

## 2021-08-26 NOTE — Discharge Instructions (Addendum)
You were seen in the emergency room for abdominal pain. It is important that you follow up closely with your primary care doctor in the next couple of days. ? ?Also recommended follow-up with gastroenterology. ? ?Please return to the emergency room right away if you are to develop chest pain, difficulty breathing, a fever, severe nausea, your pain becomes severe or worsens, you are unable to keep food down, begin vomiting any dark or bloody fluid, you develop any dark or bloody stools, feel dehydrated, or other new concerns or symptoms arise. ? ?

## 2021-08-31 ENCOUNTER — Ambulatory Visit (INDEPENDENT_AMBULATORY_CARE_PROVIDER_SITE_OTHER): Payer: Medicare Other | Admitting: Family Medicine

## 2021-08-31 ENCOUNTER — Encounter: Payer: Self-pay | Admitting: Family Medicine

## 2021-08-31 VITALS — BP 132/86 | HR 104 | Ht 64.0 in | Wt 204.2 lb

## 2021-08-31 DIAGNOSIS — R1013 Epigastric pain: Secondary | ICD-10-CM | POA: Diagnosis not present

## 2021-08-31 DIAGNOSIS — R140 Abdominal distension (gaseous): Secondary | ICD-10-CM | POA: Diagnosis not present

## 2021-08-31 MED ORDER — DICYCLOMINE HCL 10 MG PO CAPS: 10.0000 mg | ORAL_CAPSULE | Freq: Three times a day (TID) | ORAL | 2 refills | Status: DC

## 2021-08-31 NOTE — Patient Instructions (Addendum)
Thank you for coming to the office today. ? ?Keep on Probiotic daily to help regulate digestive. ? ?I will also order Dicyclomine (Bentyl) for abdominal bloating and cramping, take as needed only after or with meal up and or bedtime. ? ?Referral to GI ? ?[facility]) ?[address] ?[address] ?[place], [place] [address] ?Main: [phone] ? ?Please schedule a Follow-up Appointment to: Return if symptoms worsen or fail to improve. ? ?If you have any other questions or concerns, please feel free to call the office or send a message through [facility]. You may also schedule an earlier appointment if necessary. ? ?Additionally, you may be receiving a survey about your experience at our office within a few days to 1 week by e-mail or mail. We value your feedback. ? ?Nobie Putnam, DO ?[facility] ?

## 2021-08-31 NOTE — Progress Notes (Signed)
? ?Subjective:  ? ? Patient ID: Candace Tapia, female    DOB: [birth year], 86 y.o.   MRN: [id number] ? ?Candace Tapia is a 86 y.o. female presenting on 08/31/2021 for Hospitalization Follow-up ? ? ?HPI ? ?ED FOLLOW-UP VISIT ? ?Hospital/Location: [facility] ?Date of ED Visit: 08/26/21 ? ?Reason for Presenting to ED: Abdominal Pain ? ?FOLLOW-UP  ?- ED provider note and record have been reviewed ?- Patient presents today about 5 days after recent ED visit. Brief summary of recent course, patient had symptoms of stomach pain and back pain - she was worried about heart and concern for heart attack, she was anxious, son came over and called EMS they checked her out did EKG and did not find heart issue, she was taken to ED for evaluation. Labs done and imaging CXR, and they did evaluation and ordered CT Abdomen scan as well. A few hours after arrival to hospital pain has resolved. They recommended the referral to the GI.  ? ?She has started the Probiotic treatment and it has helped. ? ?- Today reports overall has done well after discharge from ED. Symptoms of have resolved now. ? ?- New medications on discharge: None ?- Changes to current meds on discharge: None ? ? ?I have reviewed the discharge medication list, and have reconciled the current and discharge medications today. ? ? ? ? ?  08/31/2021  ?  1:50 PM 07/27/2021  ?  3:27 PM 02/23/2021  ?  3:48 PM  ?Depression screen PHQ 2/9  ?Decreased Interest '1 1 1  '$ ?Down, Depressed, Hopeless '1 1 1  '$ ?PHQ - 2 Score '2 2 2  '$ ?Altered sleeping '1 1 1  '$ ?Tired, decreased energy '1 1 1  '$ ?Change in appetite 0 0 1  ?Feeling bad or failure about yourself  0 0 1  ?Trouble concentrating 0 0 1  ?Moving slowly or fidgety/restless 0 0 0  ?Suicidal thoughts 0 0 0  ?PHQ-9 Score '4 4 7  '$ ?Difficult doing work/chores Not difficult at all Not difficult at all Not difficult at all  ? ? ?Social History  ? ?Tobacco Use  ? Smoking status: Former  ?  Packs/day: 2.00  ?  Years: 40.00  ?  Pack years: 80.00  ?   Types: Cigarettes  ?  Quit date: 04/19/1993  ?  Years since quitting: 28.3  ? Smokeless tobacco: Never  ?Substance Use Topics  ? Alcohol use: No  ?  Alcohol/week: 0.0 standard drinks  ? Drug use: No  ? ? ?Review of Systems ?Per HPI unless specifically indicated above ? ?   ?Objective:  ?  ?BP 132/86   Pulse (!) 104   Ht '5\' 4"'$  (1.626 m)   Wt 204 lb 3.2 oz (92.6 kg)   SpO2 97%   BMI 35.05 kg/m?   ?Wt Readings from Last 3 Encounters:  ?08/31/21 204 lb 3.2 oz (92.6 kg)  ?08/26/21 206 lb (93.4 kg)  ?08/23/21 206 lb 6.4 oz (93.6 kg)  ?  ?Physical Exam ?Vitals and nursing note reviewed.  ?Constitutional:   ?   General: She is not in acute distress. ?   Appearance: Normal appearance. She is well-developed. She is not diaphoretic.  ?   Comments: Well-appearing, comfortable, cooperative  ?HENT:  ?   Head: Normocephalic and atraumatic.  ?Eyes:  ?   General:     ?   Right eye: No discharge.     ?   Left eye: No discharge.  ?   Conjunctiva/sclera:  Conjunctivae normal.  ?Cardiovascular:  ?   Rate and Rhythm: Normal rate.  ?Pulmonary:  ?   Effort: Pulmonary effort is normal.  ?Abdominal:  ?   Comments: Hyperactive bowel sounds  ?Skin: ?   General: Skin is warm and dry.  ?   Findings: No erythema or rash.  ?Neurological:  ?   Mental Status: She is alert and oriented to person, place, and time.  ?Psychiatric:     ?   Mood and Affect: Mood normal.     ?   Behavior: Behavior normal.     ?   Thought Content: Thought content normal.  ?   Comments: Well groomed, good eye contact, normal speech and thoughts  ? ? ?I have personally reviewed the radiology report from 08/26/21 CXR. ? ?DG Chest 2 ViewPerformed 08/26/2021 ?Final result  ?Study Result ?CLINICAL DATA: Chest pain ? ?EXAM: ?CHEST - 2 VIEW ? ?COMPARISON: Chest radiograph 08/18/2019 ? ?FINDINGS: ?The heart is at the upper limits of normal for size. The upper ?mediastinal contours are normal. ? ?The lungs hyperinflated with flattening of the diaphragms consistent ?with underlying  COPD. There is no focal consolidation or pulmonary ?edema. There is no pleural effusion or pneumothorax ? ?There is no acute osseous abnormality. Bilateral shoulder ?arthroplasties are noted. ? ?IMPRESSION: ?COPD. No radiographic evidence of acute cardiopulmonary process. ? ? ?Electronically Signed ?By: Valetta Mole M.D. ?On: 08/26/2021 11:28 ? ?------------------------------------- ? ?CT ABDOMEN PELVIS W CONTRASTPerformed 08/26/2021 ?Final result  ?Study Result ?CLINICAL DATA: Epigastric pain and nausea. ? ?EXAM: ?CT ABDOMEN AND PELVIS WITH CONTRAST ? ?TECHNIQUE: ?Multidetector CT imaging of the abdomen and pelvis was performed ?using the standard protocol following bolus administration of ?intravenous contrast. ? ?RADIATION DOSE REDUCTION: This exam was performed according to the ?departmental dose-optimization program which includes automated ?exposure control, adjustment of the mA and/or kV according to ?patient size and/or use of iterative reconstruction technique. ? ?CONTRAST: 137m OMNIPAQUE IOHEXOL 300 MG/ML SOLN ? ?COMPARISON: MRI abdomen 02/16/2018. CT abdomen and pelvis ?01/15/2018. ? ?FINDINGS: ?Lower chest: Chronic bronchiectasis with mucous plugging and ?scarring in the lung bases, increased from the prior study. No ?pleural effusion. ? ?Hepatobiliary: Diffusely decreased attenuation of the liver ?compatible with steatosis. 8 mm hyperenhancing focus in the ?posterior right hepatic lobe corresponding to a flash filling ?hemangioma on the prior MRI. Small stones in the gallbladder without ?evidence of pericholecystic inflammation. No biliary dilatation. ? ?Pancreas: Pancreatic atrophy. Decreased size of a cyst in the ?pancreatic body/neck region, now measuring 1.1 cm. No acute ?inflammation. ? ?Spleen: Unremarkable. ? ?Adrenals/Urinary Tract: Unremarkable adrenal glands. Hypodensities ?in the kidneys measuring up to 3.3 cm compatible with cysts; no ?routine follow-up imaging recommended. No renal calculi  or ?hydronephrosis. Small cystocele, otherwise unremarkable bladder. ? ?Stomach/Bowel: The stomach is unremarkable. There is predominantly ?left-sided colonic diverticulosis without evidence of acute ?diverticulitis. There is no evidence of bowel obstruction. The ?appendix is not clearly identified. ? ?Vascular/Lymphatic: Abdominal aortic atherosclerosis without ?aneurysm. No enlarged lymph nodes. ? ?Reproductive: Status post hysterectomy. 2.8 cm simple appearing left ?adnexal cyst for which no follow-up imaging is recommended. ? ?Other: No ascites or pneumoperitoneum. ? ?Musculoskeletal: No suspicious osseous lesion. Advanced lower lumbar ?facet arthrosis. ? ?IMPRESSION: ?1. No acute abnormality identified in the abdomen or pelvis. ?2. Hepatic steatosis. ?3. Cholelithiasis. ?4. Colonic diverticulosis. ?5. Aortic Atherosclerosis (ICD10-I70.0). ? ? ?Electronically Signed ?By: ALogan BoresM.D. ?On: 08/26/2021 16:31 ? ? ? ? ?Results for orders placed or performed during the hospital encounter of 08/26/21  ?Basic  metabolic panel  ?Result Value Ref Range  ? Sodium 136 135 - 145 mmol/L  ? Potassium 3.5 3.5 - 5.1 mmol/L  ? Chloride 99 98 - 111 mmol/L  ? CO2 26 22 - 32 mmol/L  ? Glucose, Bld 125 (H) 70 - 99 mg/dL  ? BUN 14 8 - 23 mg/dL  ? Creatinine, Ser 0.69 0.44 - 1.00 mg/dL  ? Calcium 9.4 8.9 - 10.3 mg/dL  ? GFR, Estimated >60 >60 mL/min  ? Anion gap 11 5 - 15  ?CBC  ?Result Value Ref Range  ? WBC 12.0 (H) 4.0 - 10.5 K/uL  ? RBC 4.59 3.87 - 5.11 MIL/uL  ? Hemoglobin 13.5 12.0 - 15.0 g/dL  ? HCT 41.2 36.0 - 46.0 %  ? MCV 89.8 80.0 - 100.0 fL  ? MCH 29.4 26.0 - 34.0 pg  ? MCHC 32.8 30.0 - 36.0 g/dL  ? RDW 13.5 11.5 - 15.5 %  ? Platelets 310 150 - 400 K/uL  ? nRBC 0.0 0.0 - 0.2 %  ?Hepatic function panel  ?Result Value Ref Range  ? Total Protein 7.1 6.5 - 8.1 g/dL  ? Albumin 4.1 3.5 - 5.0 g/dL  ? AST 57 (H) 15 - 41 U/L  ? ALT 31 0 - 44 U/L  ? Alkaline Phosphatase 60 38 - 126 U/L  ? Total Bilirubin 0.7 0.3 - 1.2 mg/dL  ?  Bilirubin, Direct 0.3 (H) 0.0 - 0.2 mg/dL  ? Indirect Bilirubin 0.4 0.3 - 0.9 mg/dL  ?Lipase, blood  ?Result Value Ref Range  ? Lipase 27 11 - 51 U/L  ?Troponin I (High Sensitivity)  ?Result Value Ref Range  ? Tropon

## 2021-09-07 ENCOUNTER — Telehealth: Payer: Self-pay

## 2021-09-07 NOTE — Telephone Encounter (Signed)
Copied from [place] [id number]. Topic: Referral - Request for Referral >> Sep 07, 2021  8:34 AM Tessa Lerner A wrote: Has patient seen PCP for this complaint? Yes.   *If NO, is insurance requiring patient see PCP for this issue before PCP can refer them? Referral for which specialty: Gastroenterology  Preferred provider/office: Patient has no preference  Reason for referral: stomach discomfort

## 2021-09-07 NOTE — Telephone Encounter (Signed)
Juluis Rainier - this was already done on 08/31/21.  Referral was sent to [facility] on 08/31/21  Reason for Referral: episodic abdominal pain, seems mostly postprandial functional GI symptoms with bloating, has had work up at ED CT imaging and labs, previously discussed with PCP and managing functional GI symptoms, now requesting further evaluation by GI to assist with symptom management or diagnosis   Has the referral been discussed with the patient?: yes   Designated contact for the referral if not the patient (name/phone number): patient   Has the patient seen a specialist for this issue before?: No  If so, who (practice/provider)?   Does the patient have a provider or location preference for the referral?: Yes - [facility] [place]. Would the patient like to see previous specialist if applicable?  Nobie Putnam, [facility] 09/07/2021, 9:54 AM

## 2021-09-10 DIAGNOSIS — M545 Low back pain, unspecified: Secondary | ICD-10-CM | POA: Diagnosis not present

## 2021-09-15 DIAGNOSIS — E785 Hyperlipidemia, unspecified: Secondary | ICD-10-CM | POA: Diagnosis not present

## 2021-09-15 DIAGNOSIS — F32A Depression, unspecified: Secondary | ICD-10-CM | POA: Diagnosis not present

## 2021-09-15 DIAGNOSIS — I10 Essential (primary) hypertension: Secondary | ICD-10-CM | POA: Diagnosis not present

## 2021-09-15 DIAGNOSIS — J449 Chronic obstructive pulmonary disease, unspecified: Secondary | ICD-10-CM

## 2021-09-15 DIAGNOSIS — Z87891 Personal history of nicotine dependence: Secondary | ICD-10-CM | POA: Diagnosis not present

## 2021-09-20 DIAGNOSIS — J449 Chronic obstructive pulmonary disease, unspecified: Secondary | ICD-10-CM | POA: Diagnosis not present

## 2021-09-27 ENCOUNTER — Ambulatory Visit (INDEPENDENT_AMBULATORY_CARE_PROVIDER_SITE_OTHER): Payer: Medicare Other | Admitting: Pharmacist

## 2021-09-27 DIAGNOSIS — R140 Abdominal distension (gaseous): Secondary | ICD-10-CM

## 2021-09-27 DIAGNOSIS — J432 Centrilobular emphysema: Secondary | ICD-10-CM

## 2021-09-27 NOTE — Patient Instructions (Signed)
Visit Information  Thank you for taking time to visit with me today. Please don't hesitate to contact me if I can be of assistance to you before our next scheduled telephone appointment.  Following are the goals we discussed today:   Goals Addressed             This Visit's Progress    COMPLETED: Pharmacy Goals       Please check your home blood pressure, keep a log of the results and bring this with you to your medical appointments.   Feel free to call me with any questions or concerns. I look forward to our next call!   Wallace Cullens, PharmD, [place] [phone]         Patient denies need to schedule further follow up with CCM Pharmacist at this time. Confirms has contact information for CCM Pharmacist to call if needed for future medication questions/concerns   The patient verbalized understanding of instructions, educational materials, and care plan provided today and DECLINED offer to receive copy of patient instructions, educational materials, and care plan.

## 2021-09-27 NOTE — Chronic Care Management (AMB) (Signed)
Chronic Care Management CCM Pharmacy Note  09/27/2021 Name:  Candace Tapia MRN:  [id number] DOB:  [birth year]  Subjective: Candace Tapia is an 86 y.o. year old female who is a primary patient of Olin Hauser, DO.  The CCM team was consulted for assistance with disease management and care coordination needs.    Engaged with patient by telephone for follow up visit for pharmacy case management and/or care coordination services.   Objective:  Medications Reviewed Today     Reviewed by Rennis Petty, RPH-CPP (Pharmacist) on 09/27/21 at 37  Med List Status: <None>   Medication Order Taking? Sig Documenting Provider Last Dose Status Informant  acetaminophen (TYLENOL) 650 MG CR tablet [id number]  Take 650 mg by mouth every 8 (eight) hours as needed for pain. [provider]  Active Self  albuterol (VENTOLIN HFA) 108 (90 Base) MCG/ACT inhaler [id number] Yes Inhale 1 puff into the lungs every 6 (six) hours as needed.  [provider] Taking Active Self           Med Note Fabio Bering   Sat May 13, 2017 10:07 AM)    amLODipine (NORVASC) 5 MG tablet [id number]  Take 1 tablet (5 mg total) by mouth daily. Olin Hauser, DO  Active   aspirin 81 MG chewable tablet [id number]  Chew 81 mg by mouth daily as needed. [provider]  Active            Med Note (HILL, TIFFANY A   Tue Jan 01, 2019  3:30 PM) Takes every once in a while  chlorthalidone (HYGROTON) 25 MG tablet [id number]  Take 1 tablet (25 mg total) by mouth daily. Olin Hauser, DO  Active   Cholecalciferol 25 MCG (1000 UT) capsule [id number]  Vitamin D3 25 mcg (1,000 unit) capsule  1 po qd [provider]  Active   dicyclomine (BENTYL) 10 MG capsule [id number] No Take 1 capsule (10 mg total) by mouth 4 (four) times daily -  before meals and at bedtime. Take as needed for abdominal bloating / cramping symptoms  Patient not taking: Reported on 09/27/2021    Olin Hauser, DO Not Taking Active     Discontinued 08/18/19 1134          Med Note Fabio Bering   Sat May 13, 2017 10:03 AM)    fluticasone (FLONASE) 50 MCG/ACT nasal spray [id number]  Place 1 spray into both nostrils as needed.  [provider]  Active Self  gabapentin (NEURONTIN) 400 MG capsule [id number]  TAKE 1 CAPSULE(400 MG) BY MOUTH TWICE DAILY Karamalegos, Devonne Doughty, DO  Active   losartan (COZAAR) 100 MG tablet [id number]  Take 1 tablet (100 mg total) by mouth daily. Karamalegos, Devonne Doughty, DO  Active   meclizine (ANTIVERT) 25 MG tablet [id number]  Take 0.5 tablets (12.5 mg total) by mouth every 8 (eight) hours as needed for dizziness. Karamalegos, Devonne Doughty, DO  Active   Multiple Vitamins-Minerals (MULTIVITAMIN WOMEN 50+) TABS [id number]  Take 1 tablet by mouth daily.  [provider]  Active Self  NEXIUM 20 MG capsule [id number]  Take 1 capsule (20 mg total) by mouth daily. Arlis Porta., MD  Active Self  potassium chloride (MICRO-K) 10 MEQ CR capsule [id number]  TAKE 1 CAPSULE(10 MEQ) BY MOUTH DAILY. MAY OPEN CAPSULE AND SPRINKLE OVER SOFT FOOD OR. SWALLOW WHOLE [place], Devonne Doughty, DO  Active   Probiotic Product (PROBIOTIC DAILY PO) [id number] Yes  Take by mouth. [provider] Taking Active   Spacer/Aero-Holding Chambers (AEROCHAMBER PLUS) inhaler [id number]  Use as instructed Melynda Ripple, MD  Active   Med List Note Ignatius Specking, RN 02/08/16 1505): UDS 02/08/16 meds due 03/09/16            Pertinent Labs:  Lab Results  Component Value Date   HGBA1C 5.6 08/23/2021   Lab Results  Component Value Date   CREATININE 0.69 08/26/2021   BUN 14 08/26/2021   NA 136 08/26/2021   K 3.5 08/26/2021   CL 99 08/26/2021   CO2 26 08/26/2021   BP Readings from Last 3 Encounters:  08/31/21 132/86  08/26/21 (!) 145/50  08/23/21 128/74   Pulse Readings from Last 3 Encounters:  08/31/21 (!) 104  08/26/21 85  08/23/21  (!) 102    SDOH:  (Social Determinants of Health) assessments and interventions performed:    [place]  Review of patient past medical history, allergies, medications, health status, including review of consultants reports, laboratory and other test data, was performed as part of comprehensive evaluation and provision of chronic care management services.   Care Plan : PharmD - Med Mgmt  Updates made by Rennis Petty, RPH-CPP since 09/27/2021 12:00 AM     Problem: Disease Progression      Long-Range Goal: Disease Progression Prevented or Minimized Completed 09/27/2021  Start Date: 06/22/2020  Expected End Date: 09/20/2020  Recent Progress: On track  Priority: High  Note:   Current Barriers:  Knowledge deficits related to basic COPD self care/management Knowledge deficit related to basic understanding of how to use inhalers and how inhaled medications work   Public house manager limitations  Pharmacist Clinical Goal(s):  Over the next 90 days, patient will achieve adherence to monitoring guidelines and medication adherence to achieve therapeutic efficacy through collaboration with PharmD and provider.   Interventions: 1:1 collaboration with Olin Hauser, DO regarding development and update of comprehensive plan of care as evidenced by provider attestation and co-signature Inter-disciplinary care team collaboration (see longitudinal plan of care) Perform chart review.  Patient seen for Office Visit with PCP on 5/16 for Hospitalization Follow-up Note patient seen in [facility] ED on 5/11 related to abdominal pain.  Provider advised patient: Keep on Probiotic daily to help regulate digestive Order placed for dicyclomine (Bentyl) for abdominal bloating and cramping, take as needed only after or with meal up and or bedtime Referral placed to [facility]) Patient declines to review medications today Reports did not make an appointment  with [facility] as denies having further abdominal symptoms (cramping or bloating) Reports continues to take Probiotic daily, but denies needing to take dicyclomine  Chronic Obstructive Pulmonary Disease: Patient followed by [facility] for management of COPD Current treatment: Albuterol as needed Nocturnal oxygen Previous therapies tried:Trelegy (side effect), Symbicort Next appointment with Pulmonology on 8/15  Hypertension: Current treatment: Amlodipine 5 mg daily Chlorthalidone 25 mg daily Losartan 100 mg daily Note patient has a home upper arm BP monitor Denies checking home BP recently Have encouraged patient to check home BP, keeping log and bring log with her to medical appointments. Advise patient to call office sooner for readings outside of established parameters   Patient Goals/Self-Care Activities Over the next 90 days, patient will:  Attends all scheduled provider appointments Calls pharmacy for medication refills Calls provider office for new concerns or questions  Follow Up Plan: Denies need to schedule further follow up with CCM  Pharmacist at this time. Confirms has contact information for CCM Pharmacist to call if needed for future medication questions/concerns     Candace Tapia, PharmD, [place], CPP Clinical Pharmacist [facility] [phone]

## 2021-10-05 ENCOUNTER — Other Ambulatory Visit: Payer: Self-pay | Admitting: Family Medicine

## 2021-10-05 DIAGNOSIS — I129 Hypertensive chronic kidney disease with stage 1 through stage 4 chronic kidney disease, or unspecified chronic kidney disease: Secondary | ICD-10-CM

## 2021-10-05 NOTE — Telephone Encounter (Signed)
Requested Prescriptions  Pending Prescriptions Disp Refills  . chlorthalidone (HYGROTON) 25 MG tablet [Pharmacy Med Name: CHLORTHALIDONE '25MG'$  TABLETS] 90 tablet 1    Sig: TAKE 1 TABLET(25 MG) BY MOUTH DAILY     Cardiovascular: Diuretics - Thiazide Passed - 10/05/2021  7:19 AM      Passed - Cr in normal range and within 180 days    Creat  Date Value Ref Range Status  02/16/2021 0.82 0.60 - 0.95 mg/dL Final   Creatinine, Ser  Date Value Ref Range Status  08/26/2021 0.69 0.44 - 1.00 mg/dL Final         Passed - K in normal range and within 180 days    Potassium  Date Value Ref Range Status  08/26/2021 3.5 3.5 - 5.1 mmol/L Final  04/28/2014 4.1 3.5 - 5.1 mmol/L Final         Passed - Na in normal range and within 180 days    Sodium  Date Value Ref Range Status  08/26/2021 136 135 - 145 mmol/L Final  06/12/2015 137 134 - 144 mmol/L Final  04/28/2014 139 136 - 145 mmol/L Final         Passed - Last BP in normal range    BP Readings from Last 1 Encounters:  08/31/21 132/86         Passed - Valid encounter within last 6 months    Recent Outpatient Visits          1 month ago Postprandial abdominal bloating   [place], DO   1 month ago Centrilobular emphysema ([place])   [facility] Olin Hauser, DO   7 months ago Annual physical exam   [facility] Olin Hauser, DO   1 year ago Pre-diabetes   [place], DO   1 year ago Annual physical exam   [place], DO      Future Appointments            In 4 months Parks Ranger, Devonne Doughty, DO [facility], [facility]           . losartan (COZAAR) 100 MG tablet [Pharmacy Med Name: LOSARTAN '100MG'$  TABLETS] 90 tablet 1    Sig: TAKE 1 TABLET(100 MG) BY MOUTH DAILY     Cardiovascular:  Angiotensin Receptor Blockers Passed - 10/05/2021  7:19 AM       Passed - Cr in normal range and within 180 days    Creat  Date Value Ref Range Status  02/16/2021 0.82 0.60 - 0.95 mg/dL Final   Creatinine, Ser  Date Value Ref Range Status  08/26/2021 0.69 0.44 - 1.00 mg/dL Final         Passed - K in normal range and within 180 days    Potassium  Date Value Ref Range Status  08/26/2021 3.5 3.5 - 5.1 mmol/L Final  04/28/2014 4.1 3.5 - 5.1 mmol/L Final         Passed - Patient is not pregnant      Passed - Last BP in normal range    BP Readings from Last 1 Encounters:  08/31/21 132/86         Passed - Valid encounter within last 6 months    Recent Outpatient Visits          1 month ago Postprandial abdominal bloating   [place],  DO   1 month ago Centrilobular emphysema ([place])   [place], DO   7 months ago Annual physical exam   [place], Devonne Doughty, DO   1 year ago [place], DO   1 year ago Annual physical exam   [place], DO      Future Appointments            In 4 months Parks Ranger, Devonne Doughty, DO [facility]           . amLODipine ([place]) 5 MG tablet [Pharmacy Med Name: AMLODIPINE BESYLATE '5MG'$  TABLETS] 90 tablet 1    Sig: TAKE 1 TABLET(5 MG) BY MOUTH DAILY     Cardiovascular: Calcium Channel Blockers 2 Passed - 10/05/2021  7:19 AM      Passed - Last BP in normal range    BP Readings from Last 1 Encounters:  08/31/21 132/86         Passed - Last Heart Rate in normal range    Pulse Readings from Last 1 Encounters:  08/31/21 (!) 104         Passed - Valid encounter within last 6 months    Recent Outpatient Visits          1 month ago Postprandial abdominal bloating   [place], DO   1 month ago Centrilobular emphysema [facility])    [facility] Olin Hauser, DO   7 months ago Annual physical exam   [facility]lin [facility], DO   1 year ago Pre-diabetes   [place], Devonne Doughty, DO   1 year ago Annual physical exam   [place], DO      Future Appointments            In 4 months Parks Ranger, Devonne Doughty, [facility], [facility]

## 2021-10-06 DIAGNOSIS — M47896 Other spondylosis, lumbar region: Secondary | ICD-10-CM | POA: Diagnosis not present

## 2021-10-15 DIAGNOSIS — I10 Essential (primary) hypertension: Secondary | ICD-10-CM | POA: Diagnosis not present

## 2021-10-15 DIAGNOSIS — J449 Chronic obstructive pulmonary disease, unspecified: Secondary | ICD-10-CM

## 2021-10-18 ENCOUNTER — Telehealth: Payer: Medicare Other

## 2021-10-18 ENCOUNTER — Ambulatory Visit (INDEPENDENT_AMBULATORY_CARE_PROVIDER_SITE_OTHER): Payer: Medicare Other

## 2021-10-18 DIAGNOSIS — R1013 Epigastric pain: Secondary | ICD-10-CM

## 2021-10-18 DIAGNOSIS — I129 Hypertensive chronic kidney disease with stage 1 through stage 4 chronic kidney disease, or unspecified chronic kidney disease: Secondary | ICD-10-CM

## 2021-10-18 DIAGNOSIS — G894 Chronic pain syndrome: Secondary | ICD-10-CM

## 2021-10-18 DIAGNOSIS — F3342 Major depressive disorder, recurrent, in full remission: Secondary | ICD-10-CM

## 2021-10-18 DIAGNOSIS — M160 Bilateral primary osteoarthritis of hip: Secondary | ICD-10-CM

## 2021-10-18 DIAGNOSIS — J449 Chronic obstructive pulmonary disease, unspecified: Secondary | ICD-10-CM

## 2021-10-18 DIAGNOSIS — E782 Mixed hyperlipidemia: Secondary | ICD-10-CM

## 2021-10-18 DIAGNOSIS — J432 Centrilobular emphysema: Secondary | ICD-10-CM

## 2021-10-18 NOTE — Chronic Care Management (AMB) (Signed)
Chronic Care Management   CCM RN Visit Note  10/18/2021 Name: Candace Tapia MRN: [id number] DOB: [birth year]  Subjective: Candace Tapia is a 86 y.o. year old female who is a primary care patient of Olin Hauser, DO. The care management team was consulted for assistance with disease management and care coordination needs.    Engaged with patient by telephone for follow up visit in response to provider referral for case management and/or care coordination services.   Consent to Services:  The patient was given information about Chronic Care Management services, agreed to services, and gave verbal consent prior to initiation of services.  Please see initial visit note for detailed documentation.   Patient agreed to services and verbal consent obtained.   Assessment: Review of patient past medical history, allergies, medications, health status, including review of consultants reports, laboratory and other test data, was performed as part of comprehensive evaluation and provision of chronic care management services.   SDOH (Social Determinants of Health) assessments and interventions performed:    CCM Care Plan  Allergies  Allergen Reactions   Tramadol Hcl Itching    Per pt it caused me to itch all over   Statins     Not sure what exact med was; just made her feel bad (can't elaborate)   Penicillin G Rash   Penicillins Rash    Has patient had a PCN reaction causing immediate rash, facial/tongue/throat swelling, SOB or lightheadedness with hypotension: No Has patient had a PCN reaction causing severe rash involving mucus membranes or skin necrosis: No Has patient had a PCN reaction that required hospitalization: No Has patient had a PCN reaction occurring within the last 10 years: No If all of the above answers are "NO", then may proceed with Cephalosporin use.   Sulfa Antibiotics Other (See Comments) and Rash    Outpatient Encounter Medications as of 10/18/2021   Medication Sig Note   acetaminophen (TYLENOL) 650 MG CR tablet Take 650 mg by mouth every 8 (eight) hours as needed for pain.    albuterol (VENTOLIN HFA) 108 (90 Base) MCG/ACT inhaler Inhale 1 puff into the lungs every 6 (six) hours as needed.     amLODipine (NORVASC) 5 MG tablet TAKE 1 TABLET(5 MG) BY MOUTH DAILY    aspirin 81 MG chewable tablet Chew 81 mg by mouth daily as needed. 01/01/2019: Takes every once in a while   chlorthalidone (HYGROTON) 25 MG tablet TAKE 1 TABLET(25 MG) BY MOUTH DAILY    Cholecalciferol 25 MCG (1000 UT) capsule Vitamin D3 25 mcg (1,000 unit) capsule  1 po qd    dicyclomine (BENTYL) 10 MG capsule Take 1 capsule (10 mg total) by mouth 4 (four) times daily -  before meals and at bedtime. Take as needed for abdominal bloating / cramping symptoms (Patient not taking: Reported on 09/27/2021)    fluticasone (FLONASE) 50 MCG/ACT nasal spray Place 1 spray into both nostrils as needed.     gabapentin (NEURONTIN) 400 MG capsule TAKE 1 CAPSULE(400 MG) BY MOUTH TWICE DAILY    losartan (COZAAR) 100 MG tablet TAKE 1 TABLET(100 MG) BY MOUTH DAILY    meclizine (ANTIVERT) 25 MG tablet Take 0.5 tablets (12.5 mg total) by mouth every 8 (eight) hours as needed for dizziness.    Multiple Vitamins-Minerals (MULTIVITAMIN WOMEN 50+) TABS Take 1 tablet by mouth daily.     NEXIUM 20 MG capsule Take 1 capsule (20 mg total) by mouth daily.    potassium chloride (MICRO-K) 10  MEQ CR capsule TAKE 1 CAPSULE(10 MEQ) BY MOUTH DAILY. MAY OPEN CAPSULE AND SPRINKLE OVER SOFT FOOD OR. SWALLOW WHOLE    Probiotic Product (PROBIOTIC DAILY PO) Take by mouth.    Spacer/Aero-Holding Chambers (AEROCHAMBER PLUS) inhaler Use as instructed    [DISCONTINUED] fexofenadine (ALLEGRA ALLERGY) 180 MG tablet Take 180 mg by mouth as needed. Reported on 09/29/2015    No facility-administered encounter medications on file as of 10/18/2021.    Patient Active Problem List   Diagnosis Date Noted   Obesity (BMI 35.0-39.9  without comorbidity) 01/09/2019   Peripheral polyneuropathy 12/27/2017   Vestibular disorder 03/20/2017   CKD (chronic kidney disease), stage II 12/09/2016   Chronic venous insufficiency 11/25/2016   Bilateral lower extremity edema 10/27/2016   Pre-diabetes 08/12/2016   Carotid bruit 02/09/2016   History of left breast cancer 02/09/2016   Long term current use of opiate analgesic 02/08/2016   Long term prescription opiate use 02/08/2016   Drug level above therapeutic range 02/08/2016   Chronic low back pain (Location of Tertiary source of pain) (Bilateral) (R>L) 02/08/2016   Chronic knee pain (Location of Secondary source of pain) (Bilateral) (L>R) 02/08/2016   History bilateral shoulder replacement 02/08/2016   Chronic hip pain (Location of Primary Source of Pain) (Bilateral) (R>L) 01/07/2016   Complete rupture of rotator cuff 01/07/2016   Hyperlipidemia 01/07/2016   Osteopenia 01/07/2016   Centrilobular emphysema ([place]) 07/21/2015   DDD (degenerative disc disease), lumbar 03/05/2015   Lumbar facet syndrome (Bilateral) (R>L) 03/05/2015   Sacroiliac joint dysfunction 03/05/2015   Osteoarthritis of hip (Bilateral) (R>L) 03/05/2015   DJD of shoulder 03/05/2015   Abnormal finding on mammography 03/03/2015   Back pain with radiation 03/03/2015   Major depression in full remission ([place]) 03/03/2015   Dry skin 03/03/2015   Hypokalemia 03/03/2015   Drug-induced myopathy 03/03/2015   Gastro-esophageal reflux disease without esophagitis 03/03/2015   Allergic rhinitis, seasonal 03/03/2015   Osteoarthritis of multiple joints 03/03/2015   Benign hypertension with CKD (chronic kidney disease), stage II 11/28/2014   Chronic pain 11/28/2014   Vitamin D deficiency 11/28/2014   Airway hyperreactivity 09/18/2013   Microscopic hematuria 12/20/2011    Conditions to be addressed/monitored:HTN, HLD, COPD, Depression, and chronic pain and epigastric pain   Care Plan : RNCM: General Plan of Care  (Adult) for Chronic Disease Management and Care Coordination Needs  Updates made by Vanita Ingles, RN since 10/18/2021 12:00 AM     Problem: RNCM: Developement of Plan of Care For Chronic Disease Management (HTN, HLD, Depression, COPD, Chronic pain- Athritis)   Priority: High     Long-Range Goal: RNCM: Developement of Plan of Care For Chronic Disease Management (HTN, HLD, Depression, COPD,and Chronic Pain Arthritis)   Start Date: 03/01/2021  Expected End Date: 03/01/2022  Priority: High  Note:   Current Barriers:  Knowledge Deficits related to plan of care for management of HTN, HLD, COPD, and Depression: depressed mood, Chronic pain- OA anxiety  Chronic Disease Management support and education needs related to HTN, HLD, COPD, and Depression: depressed mood, Chronic pain, OA anxiety Lacks caregiver support.         RNCM Clinical Goal(s):  Patient will verbalize understanding of plan for management of HTN, HLD, COPD, OA, and Depression  as evidenced by following the plan of care, working with the CCM team, taking medications, and compliance with dietary restricitons take all medications exactly as prescribed and will call provider for medication related questions as evidenced by compliance with medications  attend all scheduled medical appointments:saw pcp today,  08-23-2021, keeps appointments  as evidenced by keeping appointments and calling the office for needed schedule changes         demonstrate a decrease in HTN, HLD, COPD, OA, and Depression exacerbations  as evidenced by working with the CCM team to effectively manage health and well being  demonstrate ongoing self health care management ability for effective management of chronic conditions  as evidenced by working with the CCM team through collaboration with Consulting civil engineer, provider, and care team.   Interventions: 1:1 collaboration with primary care provider regarding development and update of comprehensive plan of care as  evidenced by provider attestation and co-signature Inter-disciplinary care team collaboration (see longitudinal plan of care) Evaluation of current treatment plan related to  self management and patient's adherence to plan as established by provider   SDOH Barriers (Status: Goal on Track (progressing): YES.) Long Term Goal  Patient interviewed and SDOH assessment performed        Patient interviewed and appropriate assessments performed Provided patient with information about resources available and care guides to help with any changes in SDOH Discussed plans with patient for ongoing care management follow up and provided patient with direct contact information for care management team Advised patient to call the office for changes in SDOH, questions or concerns    COPD: (Status: Goal on Track (progressing): YES.) Long Term Goal  Reviewed medications with patient, including use of prescribed maintenance and rescue inhalers, and provided instruction on medication management and the importance of adherence. 03-22-2021: The patient states that she is taking her medications as prescribed. Is using her inhalers when needed and has no concerns with medications at this time. Has upcoming appointment with pharmacy next week. 05-24-2021: The patient is having some issues with her breathing but not bad. Some is in part to the changes in the weather. She states she does well when she is resting but she admits when she is moving around she gets short of breath. She denies any acute distress today. Is taking medications as prescribed. Will continue to monitor.  07-12-2021: The patient is having more issues with her breathing. States sometimes she feels like she is "smothering". She had the paramedics come out yesterday and they checked her out. She said she did not know if it was the weather changes or what. She sometimes has to use her oxygen during the day but normally just uses it at night. She says that she can  tell it is getting worse. Ask the patient if she felt like she needed to see the pcp sooner. The patient states she did not think so. Education provided. Encouraged the patient to call the pulmonary provider if she continues to see no improvement. 08-23-2021: The patient states she can tell her breathing is getting worse over time. She says when the weather is bad her breathing is worse. Discussed pacing activities and resting when needed. 10-18-2021: The patient was short of breath this am but she states this is her usual when se does any type of activity. She is using her oxygen at night but not during the day. Sometimes she goes and lays down during the day and uses her oxygen. The patient denies any acute findings related to her breathing. She is pacing her activity and stays in, especially when it is hot outside.  Provided patient with basic written and verbal COPD education on self care/management/and exacerbation prevention Advised patient to track and manage COPD  triggers. 03-22-2021: The patient states she is stable. She stays in her apartment most of the time as she gets really "give out" when going out and walking, she just can't do that like she use to do it. She knows things that trigger her COPD. COPD is stable at this time. 05-24-2021: Review of triggers that can cause exacerbation. The patient  is aware of factors that cause issues. She stays in and does not expose herself to others outside of her apartment unless she needs to go out. She states she is watching for weather changes and her family if they are sick around her. She is going to the specialist today to see about her arthritis pain and she states she will be mindful of others around her. 08-23-2021: Review of triggers with the patient. The patient feels sometimes the weather impacts and changes her breathing. She states "sometimes I think I need to die to get better". The patient feels she is just existing most days. Empathetic listening and  support given. 10-18-2021: Denies any issues with COPD exacerbations at this time. Knows to call for changes or new needs.  Provided written and verbal instructions on pursed lip breathing and utilized returned demonstration as teach back Provided instruction about proper use of medications used for management of COPD including inhalers. 10-18-2021: Review and the patient has her inhalers and knows how to use effectively.  Advised patient to self assesses COPD action plan zone and make appointment with provider if in the yellow zone for 48 hours without improvement Advised patient to engage in light exercise as tolerated 3-5 days a week to aid in the the management of COPD Provided education about and advised patient to utilize infection prevention strategies to reduce risk of respiratory infection. 05-24-2021: The patient is aware of triggers that cause exacerbation of COPD. Is mindful of risk of infection and monitors who comes to visit her. Review of safety precautions, high risk for infection during this season, and monitoring for illness in others who are around her. 07-12-2021: Review of triggers that can cause exacerbation of COPD. She felt yesterday she could just not get her breath. She coughs but cannot get anything up. Discussed making sure she drinks plenty of water to help thin secretions. The patient states that she is trying to drink more water. Education and support given. 10-18-2021: Review of triggers and monitoring for factors that increase her risk of exacerbation. The patient states that she is mindful of things that cause additional problems for her COPD.  Discussed the importance of adequate rest and management of fatigue with COPD. 5-8 -2023: The patient states that she does not do anything but lay on the couch and watch TV. She states that sometimes she just does not want to get out of bed. She does pace her activity.  Screening for signs and symptoms of depression related to chronic disease  state  Assessed social determinant of health barriers. 05-24-2021: The patient has no new needs at this time. Has eye exam coming up on 06-10-2021 and will see the provider then. They are monitoring for a cataract on her left eye. She denies any acute findings, or changes in her vision.   Depression   (Status: Goal on Track (progressing): YES.) Long Term Goal  Evaluation of current treatment plan related to Depression and social isolation  , Mental Health Concerns  self-management and patient's adherence to plan as established by provider. 05-24-2021: The patient deals with a lot of loneliness and is  the only living sibling out of 5 children. She states that she does not know why God is allowing her to  hang around. She states that she is waiting around to "die". The patient denies SI or hurting herself. She states getting old is not for "sissy's". She enjoys outreaches with the CCM team and is thankful for the care and concern she is given. She states that she eats "too well" and is a "fat old woman".  She does not do a lot of activity and watches a lot of TV. The patient is going out today with her daughter in law to the orthopedic specialist to see about the OA pain she is experiencing. Reflective listening, emotional support and eduction provided. Will continue to monitor for changes. 07-12-2021: The patient is not in the best of moods today. She states that she just does not feel good. She states that it is getting harder and harder for her to get motivated to do anything. She states that she is just getting old and keeps hanging around. Encouraged the patient to express her feelings and get out her frustrations. She talks about her declining health and how hard it is for her. Education and support given. Empathetic listening. 08-23-2021: Saw the patient face to face in the office today. The patient is doing okay but admits she is lonely, frustrated, and tired of feeling the way she is feeling. She states she does  nothing but watch tv, eat, sleep and go to the bathroom. She says all her family has died but her sons and she does not have any hobbies. She says she ask God all the time to take her home. Empathetic listening and ask the patient to maybe change her perspective on what she is asking for. She says she is just blunt and tells it like it is. 10-18-2021: The patient was more upbeat today that usual. She states that her son has hired someone to come in about 4 hours a day and provide companionship and help for her around the  home. She thinks an assisted living would help her the best but she really cannot afford to do this. 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 Reflective listening and support given to the patient.  Discussed plans with patient for ongoing care management follow up and provided patient with direct contact information for care management team Advised patient to call the office for changes in mood, anxiety of depression ; Provided education to patient re: working with the CCM team to optimize mental health needs ; Reviewed medications with patient and discussed compliance. 10-18-2021: Patient is compliant with medications; Reviewed scheduled/upcoming provider appointments including saw pcp today: 02-23-2022 at 2 pm Discussed plans with patient for ongoing care management follow up and provided patient with direct contact information for care management team; Evaluation of depression: 03-22-2021: The patient was having a good day today. She states that she has always been a "loner" and for the most part she deals with this very well. She states that sometimes she gets really down and depressed about things. The patient had a nice Thanksgiving and will be spending Christmas with her son and his wife. She states she does not go out much because of her chronic conditions and overall health. Knows resources are available.  She has good rapport  with the CCM team and enjoys talking to them on a regular basis. She says this  helps her a lot. 07-12-2021: Today is not the best of days for the patient. The patient states that she has had several not so good days with her breathing and over all health and well being. She denies any acute distress. Denies SI or wanting to hurt herself. She just doesn't understand why she is having to go through what she is going through. Education and support given. 08-23-2021: Reflective listening. The patient states she is just old and ready to die. Encouraged the patient to think on positive things. 10-18-2021: The patient is more upbeat today than usual. She states she is taking things one day at a time and she is trying to remain positive. Her family is checking in on her and have some people coming in the day times to help her with her with not being so lonely and give her some companionship.  Hyperlipidemia:  (Status: Goal on Track (progressing): YES.) Long Term Goal  Lab Results  Component Value Date   CHOL 202 (H) 02/16/2021   HDL 50 02/16/2021   LDLCALC 116 (H) 02/16/2021   TRIG 238 (H) 02/16/2021   CHOLHDL 4.0 02/16/2021     Medication review performed; medication list updated in electronic medical record. 08-23-2021: The patient cannot tolerate statins. The patient denies any issues with her medications at this time.  Provider established cholesterol goals reviewed; Counseled on importance of regular laboratory monitoring as prescribed. 10-18-2021: The patient is compliant with having routine lab work done. Yearly cholesterol check in November of 2023 Provided HLD educational materials; Reviewed role and benefits of statin for ASCVD risk reduction; Discussed strategies to manage statin-induced myalgias; Reviewed importance of limiting foods high in cholesterol. 08-23-2021 Review of heart healthy diet. The patient admits she should lose weight that it would help her feel much better. The patient states that  eating is one of the things she enjoys. She denies any acute findings related to her dietary consumption. Encouraged the patient to monitor for fats, sodium, and concentrated sweets in her diet. 10-18-2021: The patient is eating well. Denies any issues with dietary restrictions.   Hypertension: (Status: Goal on Track (progressing): YES.) Last practice recorded BP readings:  BP Readings from Last 3 Encounters:  08/31/21 132/86  08/26/21 (!) 145/50  08/23/21 128/74  Most recent eGFR/CrCl:  Lab Results  Component Value Date   EGFR 71 02/16/2021    No components found for: CRCL  Evaluation of current treatment plan related to hypertension self management and patient's adherence to plan as established by provider. 08-23-2021: The patient is doing well with management of her HTN. She denies any acute findings with HTN or heart health. Will continue to monitor. 10-18-2021: The patient had elevated blood pressures in May due to epigastric pain and the patient went to the ER. The patient states they gave her some medications and she feels better now. Denies any acute distress at this time. ;   Provided education to patient re: stroke prevention, s/s of heart attack and stroke; Reviewed prescribed diet heart healthy. 10-18-2021: Review of heart healthy diet. Education on eating fruits and vegetables and variety of foods.  Reviewed medications with patient and discussed importance of compliance. 10-18-2021: The patient is compliant with her medications. Denies any medication needs;  Discussed plans with patient for ongoing care management follow up and provided patient with direct contact information for care management team; Advised patient, providing education and rationale, to monitor blood pressure daily and record, calling PCP for findings outside established parameters. 03-22-2021:  The patient had not taken her blood pressure this am. The patient states that she does not take it everyday but it has been good. She  says she takes 3 different medications for blood pressure control. She denies any orthostatic hypotension but review of orthostatic hypotension and changing position slowly to prevent falls or injuries.08-23-2021: the patient denies any acute changes in her blood pressure. Feels she is doing very well with control of her blood pressures and heart health. 10-18-2021: States her blood pressure are doing better now since she is staying calm. She states she was feeling bad when her son took her to the ER but she is much better now.  Advised patient to discuss trends in blood pressure with provider; Provided education on prescribed diet heart healthy ;  Discussed complications of poorly controlled blood pressure such as heart disease, stroke, circulatory complications, vision complications, kidney impairment, sexual dysfunction;    Pain:  (Status: Goal on Track (progressing): YES.) Long Term Goal  Pain assessment performed. 05-24-2021: The patient states when she is sitting of laying she does not have pain. Long standing  history of OA pain in her joints. Has it all over but her right hip and knee are causing a lot of issues right now. Denies pain at the time of the call as she was sitting, but states when she is walking sometimes it is unbearable. The patient states that she is going to the specialist today to see what recommendations they have. She has had injections in her shoulders before and that has been helpful for her. 08-23-2021: The patient is doing okay. States her OA is about the same. Worse on days when the weather is different. Denies any acute findings today. She states when she is sitting she has no pain at all. When she is moving it is about a 5. Does elevated her feet when she is laying on the couch. Does not wear compression hose. 10-18-2021: Denies pain or discomfort today. Mainly has pain when she is standing up. The patient states that her epigastric pain also has resolved. They gave her medications  and she feels much better. Medications reviewed. 08-23-2021: The patient states she was taking Tylenol Arthritis for her pain level but now she is taking Aleve and it works better for her. Education on discussing pain relief options with the pcp at her appointment today. 10-18-2021: The patient states she is taking medications that help with her abdominal pain and she has not had any further issues with abdominal pain.  Reviewed provider established plan for pain management. 05-24-2021: The patient is going today to the ortho specialist for evaluation of OA, mainly pain in her right hip and knee that is worse when ambulating. The patient endorses using DME when ambulating, practicing safety and denies falls. 07-12-2021: Sees the pcp in May of 2023. 08-23-2021: States she is compliant with the plan of care for effective management of her pain and discomfort. 10-18-2021: Review with the patient going to see the GI provider. She states that she has decided not to go to the GI provider because the issues have resolved. The patient states that she is doing well and managing her pain effectively.  Discussed importance of adherence to all scheduled medical appointments. 05-24-2021: The patient has an appointment today with the orthopedic specialist to see about worsening pain in her right hip and knee. 07-12-2021: Will see the pcp on Aug 23, 2021 for follow up. 08-23-2021: Saw pcp today and keeps appointments. Will see  the pcp again in November. States she would have to wait a while to see a GI doctor so she just did not make an appointment. The patient states she feels she is doing better.  Counseled on the importance of reporting any/all new or changed pain symptoms or management strategies to pain management provider; Advised patient to report to care team affect of pain on daily activities; Discussed use of relaxation techniques and/or diversional activities to assist with pain reduction (distraction, imagery, relaxation,  massage, acupressure, TENS, heat, and cold application. 05-24-2021: Evaluation of heat therapy to affected areas. The patient states she has actually never tried that. The patient states that may be something to consider. The patient encouraged to ask the provider today for non-evasive ways to help with pain and discomfort from OA. 10-18-2021: The patient states that she is monitoring for changes. Education on sitting up at least 30 minutes to an hour after eating to make sure she does not have acid reflux pain. Review of making sure she monitors for factors that exacerbate her condition. Education and support given.  Reviewed with patient prescribed pharmacological and nonpharmacological pain relief strategies; Advised patient to discuss unresolved pain, changes in level or intensity of pain with provider;   Patient Goals/Self-Care Activities: Patient will self administer medications as prescribed as evidenced by self report/primary caregiver report  Patient will attend all scheduled provider appointments as evidenced by clinician review of documented attendance to scheduled appointments and patient/caregiver report Patient will call pharmacy for medication refills as evidenced by patient report and review of pharmacy fill history as appropriate Patient will attend church or other social activities as evidenced by patient report Patient will continue to perform ADL's independently as evidenced by patient/caregiver report Patient will continue to perform IADL's independently as evidenced by patient/caregiver report Patient will call provider office for new concerns or questions as evidenced by review of documented incoming telephone call notes and patient report Patient will work with BSW to address care coordination needs and will continue to work with the clinical team to address health care and disease management related needs as evidenced by documented adherence to scheduled care management/care  coordination appointments - eliminate smoking in my home - identify and remove indoor air pollutants - limit outdoor activity during cold weather - listen for public air quality announcements every day - do breathing exercises every day - arrange respite care for caregiver - develop a rescue plan - eliminate symptom triggers at home - follow rescue plan if symptoms flare-up - use an extra pillow to sleep - develop a new routine to improve sleep - don't eat or exercise right before bedtime - eat healthy/prescribed diet: heart healthy diet  - get at least 7 to 8 hours of sleep at night - use devices that will help like a cane, sock-puller or reacher - practice relaxation or meditation daily - do exercises in a comfortable position that makes breathing as easy as possible - check blood pressure weekly - choose a place to take my blood pressure (home, clinic or office, retail store) - write blood pressure results in a log or diary - learn about high blood pressure - keep a blood pressure log - take blood pressure log to all doctor appointments - call doctor for signs and symptoms of high blood pressure - develop an action plan for high blood pressure - keep all doctor appointments: Specialist today 05-24-2021 and eye doctor on 06-10-2021 - take medications for blood pressure exactly as prescribed -  report new symptoms to your doctor - eat more whole grains, fruits and vegetables, lean meats and healthy fats - call for medicine refill 2 or 3 days before it runs out - take all medications exactly as prescribed - call doctor with any symptoms you believe are related to your medicine - call doctor when you experience any new symptoms - go to all doctor appointments as scheduled - adhere to prescribed diet: heart healthy       Plan:Telephone follow up appointment with care management team member scheduled for:  01-10-2022 at 0900 am  Noreene Larsson RN, MSN, [place] Mobile: [phone]

## 2021-10-18 NOTE — Patient Instructions (Signed)
Visit Information  Thank you for taking time to visit with me today. Please don't hesitate to contact me if I can be of assistance to you before our next scheduled telephone appointment.  Following are the goals we discussed today:  COPD: (Status: Goal on Track (progressing): YES.) Long Term Goal  Reviewed medications with patient, including use of prescribed maintenance and rescue inhalers, and provided instruction on medication management and the importance of adherence. 03-22-2021: The patient states that she is taking her medications as prescribed. Is using her inhalers when needed and has no concerns with medications at this time. Has upcoming appointment with pharmacy next week. 05-24-2021: The patient is having some issues with her breathing but not bad. Some is in part to the changes in the weather. She states she does well when she is resting but she admits when she is moving around she gets short of breath. She denies any acute distress today. Is taking medications as prescribed. Will continue to monitor.  07-12-2021: The patient is having more issues with her breathing. States sometimes she feels like she is "smothering". She had the paramedics come out yesterday and they checked her out. She said she did not know if it was the weather changes or what. She sometimes has to use her oxygen during the day but normally just uses it at night. She says that she can tell it is getting worse. Ask the patient if she felt like she needed to see the pcp sooner. The patient states she did not think so. Education provided. Encouraged the patient to call the pulmonary provider if she continues to see no improvement. 08-23-2021: The patient states she can tell her breathing is getting worse over time. She says when the weather is bad her breathing is worse. Discussed pacing activities and resting when needed. 10-18-2021: The patient was short of breath this am but she states this is her usual when se does any type of  activity. She is using her oxygen at night but not during the day. Sometimes she goes and lays down during the day and uses her oxygen. The patient denies any acute findings related to her breathing. She is pacing her activity and stays in, especially when it is hot outside.  Provided patient with basic written and verbal COPD education on self care/management/and exacerbation prevention Advised patient to track and manage COPD triggers. 03-22-2021: The patient states she is stable. She stays in her apartment most of the time as she gets really "give out" when going out and walking, she just can't do that like she use to do it. She knows things that trigger her COPD. COPD is stable at this time. 05-24-2021: Review of triggers that can cause exacerbation. The patient  is aware of factors that cause issues. She stays in and does not expose herself to others outside of her apartment unless she needs to go out. She states she is watching for weather changes and her family if they are sick around her. She is going to the specialist today to see about her arthritis pain and she states she will be mindful of others around her. 08-23-2021: Review of triggers with the patient. The patient feels sometimes the weather impacts and changes her breathing. She states "sometimes I think I need to die to get better". The patient feels she is just existing most days. Empathetic listening and support given. 10-18-2021: Denies any issues with COPD exacerbations at this time. Knows to call for changes or new  needs.  Provided written and verbal instructions on pursed lip breathing and utilized returned demonstration as teach back Provided instruction about proper use of medications used for management of COPD including inhalers. 10-18-2021: Review and the patient has her inhalers and knows how to use effectively.  Advised patient to self assesses COPD action plan zone and make appointment with provider if in the yellow zone for 48 hours  without improvement Advised patient to engage in light exercise as tolerated 3-5 days a week to aid in the the management of COPD Provided education about and advised patient to utilize infection prevention strategies to reduce risk of respiratory infection. 05-24-2021: The patient is aware of triggers that cause exacerbation of COPD. Is mindful of risk of infection and monitors who comes to visit her. Review of safety precautions, high risk for infection during this season, and monitoring for illness in others who are around her. 07-12-2021: Review of triggers that can cause exacerbation of COPD. She felt yesterday she could just not get her breath. She coughs but cannot get anything up. Discussed making sure she drinks plenty of water to help thin secretions. The patient states that she is trying to drink more water. Education and support given. 10-18-2021: Review of triggers and monitoring for factors that increase her risk of exacerbation. The patient states that she is mindful of things that cause additional problems for her COPD.  Discussed the importance of adequate rest and management of fatigue with COPD. 5-8 -2023: The patient states that she does not do anything but lay on the couch and watch TV. She states that sometimes she just does not want to get out of bed. She does pace her activity.  Screening for signs and symptoms of depression related to chronic disease state  Assessed social determinant of health barriers. 05-24-2021: The patient has no new needs at this time. Has eye exam coming up on 06-10-2021 and will see the provider then. They are monitoring for a cataract on her left eye. She denies any acute findings, or changes in her vision.    Depression   (Status: Goal on Track (progressing): YES.) Long Term Goal  Evaluation of current treatment plan related to Depression and social isolation  , Mental Health Concerns  self-management and patient's adherence to plan as established by provider.  05-24-2021: The patient deals with a lot of loneliness and is the only living sibling out of 5 children. She states that she does not know why God is allowing her to  hang around. She states that she is waiting around to "die". The patient denies SI or hurting herself. She states getting old is not for "sissy's". She enjoys outreaches with the CCM team and is thankful for the care and concern she is given. She states that she eats "too well" and is a "fat old woman".  She does not do a lot of activity and watches a lot of TV. The patient is going out today with her daughter in law to the orthopedic specialist to see about the OA pain she is experiencing. Reflective listening, emotional support and eduction provided. Will continue to monitor for changes. 07-12-2021: The patient is not in the best of moods today. She states that she just does not feel good. She states that it is getting harder and harder for her to get motivated to do anything. She states that she is just getting old and keeps hanging around. Encouraged the patient to express her feelings and get out her  frustrations. She talks about her declining health and how hard it is for her. Education and support given. Empathetic listening. 08-23-2021: Saw the patient face to face in the office today. The patient is doing okay but admits she is lonely, frustrated, and tired of feeling the way she is feeling. She states she does nothing but watch tv, eat, sleep and go to the bathroom. She says all her family has died but her sons and she does not have any hobbies. She says she ask God all the time to take her home. Empathetic listening and ask the patient to maybe change her perspective on what she is asking for. She says she is just blunt and tells it like it is. 10-18-2021: The patient was more upbeat today that usual. She states that her son has hired someone to come in about 4 hours a day and provide companionship and help for her around the  home. She thinks an  assisted living would help her the best but she really cannot afford to do this. 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 Reflective listening and support given to the patient.  Discussed plans with patient for ongoing care management follow up and provided patient with direct contact information for care management team Advised patient to call the office for changes in mood, anxiety of depression ; Provided education to patient re: working with the CCM team to optimize mental health needs ; Reviewed medications with patient and discussed compliance. 10-18-2021: Patient is compliant with medications; Reviewed scheduled/upcoming provider appointments including saw pcp today: 02-23-2022 at 2 pm Discussed plans with patient for ongoing care management follow up and provided patient with direct contact information for care management team; Evaluation of depression: 03-22-2021: The patient was having a good day today. She states that she has always been a "loner" and for the most part she deals with this very well. She states that sometimes she gets really down and depressed about things. The patient had a nice Thanksgiving and will be spending Christmas with her son and his wife. She states she does not go out much because of her chronic conditions and overall health. Knows resources are available.  She has good rapport with the CCM team and enjoys talking to them on a regular basis. She says this helps her a lot. 07-12-2021: Today is not the best of days for the patient. The patient states that she has had several not so good days with her breathing and over all health and well being. She denies any acute distress. Denies SI or wanting to hurt herself. She just doesn't understand why she is having to go through what she is going through. Education and support given. 08-23-2021: Reflective listening. The patient states she is just old and ready to die.  Encouraged the patient to think on positive things. 10-18-2021: The patient is more upbeat today than usual. She states she is taking things one day at a time and she is trying to remain positive. Her family is checking in on her and have some people coming in the day times to help her with her with not being so lonely and give her some companionship.   Hyperlipidemia:  (Status: Goal on Track (progressing): YES.) Long Term Goal       Lab Results  Component Value Date    CHOL 202 (H) 02/16/2021    HDL 50 02/16/2021    [place]  116 (H) 02/16/2021    TRIG 238 (H) 02/16/2021    CHOLHDL 4.0 02/16/2021      Medication review performed; medication list updated in electronic medical record. 08-23-2021: The patient cannot tolerate statins. The patient denies any issues with her medications at this time.  Provider established cholesterol goals reviewed; Counseled on importance of regular laboratory monitoring as prescribed. 10-18-2021: The patient is compliant with having routine lab work done. Yearly cholesterol check in November of 2023 Provided HLD educational materials; Reviewed role and benefits of statin for ASCVD risk reduction; Discussed strategies to manage statin-induced myalgias; Reviewed importance of limiting foods high in cholesterol. 08-23-2021 Review of heart healthy diet. The patient admits she should lose weight that it would help her feel much better. The patient states that eating is one of the things she enjoys. She denies any acute findings related to her dietary consumption. Encouraged the patient to monitor for fats, sodium, and concentrated sweets in her diet. 10-18-2021: The patient is eating well. Denies any issues with dietary restrictions.    Hypertension: (Status: Goal on Track (progressing): YES.) Last practice recorded BP readings:     BP Readings from Last 3 Encounters:  08/31/21 132/86  08/26/21 (!) 145/50  08/23/21 128/74  Most recent eGFR/CrCl:       Lab Results   Component Value Date    EGFR 71 02/16/2021    No components found for: CRCL   Evaluation of current treatment plan related to hypertension self management and patient's adherence to plan as established by provider. 08-23-2021: The patient is doing well with management of her HTN. She denies any acute findings with HTN or heart health. Will continue to monitor. 10-18-2021: The patient had elevated blood pressures in May due to epigastric pain and the patient went to the ER. The patient states they gave her some medications and she feels better now. Denies any acute distress at this time. ;   Provided education to patient re: stroke prevention, s/s of heart attack and stroke; Reviewed prescribed diet heart healthy. 10-18-2021: Review of heart healthy diet. Education on eating fruits and vegetables and variety of foods.  Reviewed medications with patient and discussed importance of compliance. 10-18-2021: The patient is compliant with her medications. Denies any medication needs;  Discussed plans with patient for ongoing care management follow up and provided patient with direct contact information for care management team; Advised patient, providing education and rationale, to monitor blood pressure daily and record, calling PCP for findings outside established parameters. 03-22-2021: The patient had not taken her blood pressure this am. The patient states that she does not take it everyday but it has been good. She says she takes 3 different medications for blood pressure control. She denies any orthostatic hypotension but review of orthostatic hypotension and changing position slowly to prevent falls or injuries.08-23-2021: the patient denies any acute changes in her blood pressure. Feels she is doing very well with control of her blood pressures and heart health. 10-18-2021: States her blood pressure are doing better now since she is staying calm. She states she was feeling bad when her son took her to the ER but she  is much better now.  Advised patient to discuss trends in blood pressure with provider; Provided education on prescribed diet heart healthy ;  Discussed complications of poorly controlled blood pressure such as heart disease, stroke, circulatory complications, vision complications, kidney impairment, sexual dysfunction;      Pain:  (Status: Goal on Track (progressing): YES.)  Long Term Goal  Pain assessment performed. 05-24-2021: The patient states when she is sitting of laying she does not have pain. Long standing  history of OA pain in her joints. Has it all over but her right hip and knee are causing a lot of issues right now. Denies pain at the time of the call as she was sitting, but states when she is walking sometimes it is unbearable. The patient states that she is going to the specialist today to see what recommendations they have. She has had injections in her shoulders before and that has been helpful for her. 08-23-2021: The patient is doing okay. States her OA is about the same. Worse on days when the weather is different. Denies any acute findings today. She states when she is sitting she has no pain at all. When she is moving it is about a 5. Does elevated her feet when she is laying on the couch. Does not wear compression hose. 10-18-2021: Denies pain or discomfort today. Mainly has pain when she is standing up. The patient states that her epigastric pain also has resolved. They gave her medications and she feels much better. Medications reviewed. 08-23-2021: The patient states she was taking Tylenol Arthritis for her pain level but now she is taking Aleve and it works better for her. Education on discussing pain relief options with the pcp at her appointment today. 10-18-2021: The patient states she is taking medications that help with her abdominal pain and she has not had any further issues with abdominal pain.  Reviewed provider established plan for pain management. 05-24-2021: The patient is going  today to the ortho specialist for evaluation of OA, mainly pain in her right hip and knee that is worse when ambulating. The patient endorses using DME when ambulating, practicing safety and denies falls. 07-12-2021: Sees the pcp in May of 2023. 08-23-2021: States she is compliant with the plan of care for effective management of her pain and discomfort. 10-18-2021: Review with the patient going to see the GI provider. She states that she has decided not to go to the GI provider because the issues have resolved. The patient states that she is doing well and managing her pain effectively.  Discussed importance of adherence to all scheduled medical appointments. 05-24-2021: The patient has an appointment today with the orthopedic specialist to see about worsening pain in her right hip and knee. 07-12-2021: Will see the pcp on Aug 23, 2021 for follow up. 08-23-2021: Saw pcp today and keeps appointments. Will see the pcp again in November. States she would have to wait a while to see a GI doctor so she just did not make an appointment. The patient states she feels she is doing better.  Counseled on the importance of reporting any/all new or changed pain symptoms or management strategies to pain management provider; Advised patient to report to care team affect of pain on daily activities; Discussed use of relaxation techniques and/or diversional activities to assist with pain reduction (distraction, imagery, relaxation, massage, acupressure, TENS, heat, and cold application. 05-24-2021: Evaluation of heat therapy to affected areas. The patient states she has actually never tried that. The patient states that may be something to consider. The patient encouraged to ask the provider today for non-evasive ways to help with pain and discomfort from OA. 10-18-2021: The patient states that she is monitoring for changes. Education on sitting up at least 30 minutes to an hour after eating to make sure she does not have  acid reflux pain.  Review of making sure she monitors for factors that exacerbate her condition. Education and support given.  Reviewed with patient prescribed pharmacological and nonpharmacological pain relief strategies; Advised patient to discuss unresolved pain, changes in level or intensity of pain with provider;     Our next appointment is by telephone on 01-10-2022 at 0900 am  Please call the care guide team at [phone] if you need to cancel or reschedule your appointment.   If you are experiencing a Mental Health or [place] or need someone to talk to, please call the Suicide and Crisis Lifeline: 988 call the [place] National Suicide Prevention Lifeline: 951-666-9747 or TTY: 4066211654 TTY 214-702-2423) to talk to a trained counselor call 1-800-273-TALK (toll free, 24 hour hotline)   The patient verbalized understanding of instructions, educational materials, and care plan provided today and DECLINED offer to receive copy of patient instructions, educational materials, and care plan.   Noreene Larsson RN, MSN, [place] Mobile: [phone]

## 2021-10-20 DIAGNOSIS — J449 Chronic obstructive pulmonary disease, unspecified: Secondary | ICD-10-CM | POA: Diagnosis not present

## 2021-11-15 ENCOUNTER — Other Ambulatory Visit: Payer: Self-pay | Admitting: Family Medicine

## 2021-11-15 DIAGNOSIS — M160 Bilateral primary osteoarthritis of hip: Secondary | ICD-10-CM | POA: Diagnosis not present

## 2021-11-15 DIAGNOSIS — J449 Chronic obstructive pulmonary disease, unspecified: Secondary | ICD-10-CM | POA: Diagnosis not present

## 2021-11-15 DIAGNOSIS — I129 Hypertensive chronic kidney disease with stage 1 through stage 4 chronic kidney disease, or unspecified chronic kidney disease: Secondary | ICD-10-CM

## 2021-11-15 DIAGNOSIS — E782 Mixed hyperlipidemia: Secondary | ICD-10-CM | POA: Diagnosis not present

## 2021-11-15 DIAGNOSIS — N182 Chronic kidney disease, stage 2 (mild): Secondary | ICD-10-CM | POA: Diagnosis not present

## 2021-11-15 DIAGNOSIS — F3342 Major depressive disorder, recurrent, in full remission: Secondary | ICD-10-CM | POA: Diagnosis not present

## 2021-11-15 DIAGNOSIS — J432 Centrilobular emphysema: Secondary | ICD-10-CM

## 2021-11-15 DIAGNOSIS — G629 Polyneuropathy, unspecified: Secondary | ICD-10-CM

## 2021-11-16 NOTE — Telephone Encounter (Signed)
Requested Prescriptions  Pending Prescriptions Disp Refills  . gabapentin (NEURONTIN) 400 MG capsule [Pharmacy Med Name: GABAPENTIN '400MG'$  CAPSULES] 180 capsule 0    Sig: TAKE 1 CAPSULE(400 MG) BY MOUTH TWICE DAILY     Neurology: Anticonvulsants - gabapentin Passed - 11/15/2021 10:36 AM      Passed - Cr in normal range and within 360 days    Creat  Date Value Ref Range Status  02/16/2021 0.82 0.60 - 0.95 mg/dL Final   Creatinine, Ser  Date Value Ref Range Status  08/26/2021 0.69 0.44 - 1.00 mg/dL Final         Passed - Completed PHQ-2 or PHQ-9 in the last 360 days      Passed - Valid encounter within last 12 months    Recent Outpatient Visits          2 months ago Postprandial abdominal bloating   [place], DO   2 months ago Centrilobular emphysema [facility])   [facility] Olin Hauser, DO   8 months ago Annual physical exam   [facility] Olin Hauser, DO   1 year ago [place], Devonne Doughty, DO   1 year ago Annual physical exam   [place], DO      Future Appointments            In 3 months [place], Devonne Doughty, [facility], [facility]

## 2021-11-20 DIAGNOSIS — J449 Chronic obstructive pulmonary disease, unspecified: Secondary | ICD-10-CM | POA: Diagnosis not present

## 2021-12-08 DIAGNOSIS — M47896 Other spondylosis, lumbar region: Secondary | ICD-10-CM | POA: Diagnosis not present

## 2021-12-14 DIAGNOSIS — J432 Centrilobular emphysema: Secondary | ICD-10-CM | POA: Diagnosis not present

## 2021-12-21 DIAGNOSIS — J449 Chronic obstructive pulmonary disease, unspecified: Secondary | ICD-10-CM | POA: Diagnosis not present

## 2022-01-10 ENCOUNTER — Ambulatory Visit: Payer: Self-pay

## 2022-01-10 ENCOUNTER — Telehealth: Payer: Medicare Other

## 2022-01-10 NOTE — Patient Instructions (Signed)
Visit Information  Thank you for taking time to visit with me today. Please don't hesitate to contact me if I can be of assistance to you.   Following are the goals we discussed today:   Goals Addressed             This Visit's Progress    RNCM: Effecitve Management of Depression and Anxiety       Care Coordination Interventions: Evaluation of current treatment plan related to depression and anxiety and patient's adherence to plan as established by provider. The patient lives alone but her sons and daughter in law come in to see her and also have a couple of ladies come in each week to help her with cooking and cleaning. The patient states she is eating well but she does not do anything but eat, lay on the couch and watch tv and sleep. The patient denies any acute distress but admits she is lonely. Education and support given.  Advised patient to call the office for changes in mood, anxiety, depression, or mental health Provided education to patient re: resources to help with socialization, working with the resources available to help express her anxiety and loneliness  Reviewed scheduled/upcoming provider appointments including 02-23-2022 at 2 pm Discussed plans with patient for ongoing care management follow up and provided patient with direct contact information for care management team Advised patient to discuss changes in her health and wellbeing  with provider  Active listening / Reflection utilized  Emotional Support Provided      RNCM: Effective managment of COPD       Care Coordination Interventions: Provided patient with basic written and verbal COPD education on self care/management/and exacerbation prevention Advised patient to track and manage COPD triggers Provided written and verbal instructions on pursed lip breathing and utilized returned demonstration as teach back Provided instruction about proper use of medications used for management of COPD including  inhalers Advised patient to self assesses COPD action plan zone and make appointment with provider if in the yellow zone for 48 hours without improvement Advised patient to engage in light exercise as tolerated 3-5 days a week to aid in the the management of COPD Provided education about and advised patient to utilize infection prevention strategies to reduce risk of respiratory infection Discussed the importance of adequate rest and management of fatigue with COPD Screening for signs and symptoms of depression related to chronic disease state  Assessed social determinant of health barriers           Our next appointment is by telephone on 03-23-2022 at 0900 am  Please call the care guide team at [phone] if you need to cancel or reschedule your appointment.   If you are experiencing a Mental Health or [place] or need someone to talk to, please call the Suicide and Crisis Lifeline: 988 call the [place] National Suicide Prevention Lifeline: 872-658-5524 or TTY: 651-594-1716 TTY 5794478704) to talk to a trained counselor call 1-800-273-TALK (toll free, 24 hour hotline)  The patient verbalized understanding of instructions, educational materials, and care plan provided today and DECLINED offer to receive copy of patient instructions, educational materials, and care plan.   Telephone follow up appointment with care management team member scheduled for: 03-23-2022 at 0900 am  [place], MSN, [place]  Mobile: [phone]

## 2022-01-10 NOTE — Patient Outreach (Signed)
  Care Coordination   Follow Up Visit Note   01/10/2022 Name: Candace Tapia MRN: [id number] DOB: 01-11-1936  Candace Tapia is a 86 y.o. year old female who sees Olin Hauser, DO for primary care. I spoke with  Candace Tapia by phone today.  What matters to the patients health and wellness today?  Her breathing and her depression     Goals Addressed             This Visit's Progress    RNCM: Effecitve Management of Depression and Anxiety       Care Coordination Interventions: Evaluation of current treatment plan related to depression and anxiety and patient's adherence to plan as established by provider. The patient lives alone but her sons and daughter in law come in to see her and also have a couple of ladies come in each week to help her with cooking and cleaning. The patient states she is eating well but she does not do anything but eat, lay on the couch and watch tv and sleep. The patient denies any acute distress but admits she is lonely. Education and support given.  Advised patient to call the office for changes in mood, anxiety, depression, or mental health Provided education to patient re: resources to help with socialization, working with the resources available to help express her anxiety and loneliness  Reviewed scheduled/upcoming provider appointments including 02-23-2022 at 2 pm Discussed plans with patient for ongoing care management follow up and provided patient with direct contact information for care management team Advised patient to discuss changes in her health and wellbeing  with provider  Active listening / Reflection utilized  Emotional Support Provided      RNCM: Effective managment of COPD       Care Coordination Interventions: Provided patient with basic written and verbal COPD education on self care/management/and exacerbation prevention Advised patient to track and manage COPD triggers Provided written and verbal instructions on  pursed lip breathing and utilized returned demonstration as teach back Provided instruction about proper use of medications used for management of COPD including inhalers Advised patient to self assesses COPD action plan zone and make appointment with provider if in the yellow zone for 48 hours without improvement Advised patient to engage in light exercise as tolerated 3-5 days a week to aid in the the management of COPD Provided education about and advised patient to utilize infection prevention strategies to reduce risk of respiratory infection Discussed the importance of adequate rest and management of fatigue with COPD Screening for signs and symptoms of depression related to chronic disease state  Assessed social determinant of health barriers           SDOH assessments and interventions completed:  Yes  SDOH Interventions Today    Flowsheet Row Most Recent Value  SDOH Interventions   Utilities Interventions Intervention Not Indicated        Care Coordination Interventions Activated:  Yes  Care Coordination Interventions:  Yes, provided   Follow up plan: Follow up call scheduled for 03-23-2022 at 0900 am    Encounter Outcome:  Pt. Visit Completed   Noreene Larsson RN, MSN, [place]  Mobile: [phone]

## 2022-01-20 DIAGNOSIS — J449 Chronic obstructive pulmonary disease, unspecified: Secondary | ICD-10-CM | POA: Diagnosis not present

## 2022-02-12 ENCOUNTER — Other Ambulatory Visit: Payer: Self-pay | Admitting: Family Medicine

## 2022-02-12 DIAGNOSIS — G629 Polyneuropathy, unspecified: Secondary | ICD-10-CM

## 2022-02-14 NOTE — Telephone Encounter (Signed)
Requested Prescriptions  Pending Prescriptions Disp Refills  . gabapentin (NEURONTIN) 400 MG capsule [Pharmacy Med Name: GABAPENTIN '400MG'$  CAPSULES] 180 capsule 2    Sig: TAKE 1 CAPSULE(400 MG) BY MOUTH TWICE DAILY     Neurology: Anticonvulsants - gabapentin Passed - 02/12/2022 10:11 AM      Passed - Cr in normal range and within 360 days    Creat  Date Value Ref Range Status  02/16/2021 0.82 0.60 - 0.95 mg/dL Final   Creatinine, Ser  Date Value Ref Range Status  08/26/2021 0.69 0.44 - 1.00 mg/dL Final         Passed - Completed PHQ-2 or PHQ-9 in the last 360 days      Passed - Valid encounter within last 12 months    Recent Outpatient Visits          5 months ago Postprandial abdominal bloating   [place], DO   5 months ago Centrilobular emphysema [facility]   [facility] Olin Hauser, DO   11 months ago Annual physical exam   [facility] Olin Hauser, DO   1 year ago Pre-diabetes   [place], DO   1 year ago Annual physical exam   [place], DO      Future Appointments            In 1 week [place], Devonne Doughty, [facility], [facility]

## 2022-02-16 ENCOUNTER — Other Ambulatory Visit: Payer: Medicare Other

## 2022-02-16 ENCOUNTER — Other Ambulatory Visit: Payer: Self-pay

## 2022-02-16 DIAGNOSIS — R7303 Prediabetes: Secondary | ICD-10-CM

## 2022-02-16 DIAGNOSIS — R7309 Other abnormal glucose: Secondary | ICD-10-CM | POA: Diagnosis not present

## 2022-02-16 DIAGNOSIS — E669 Obesity, unspecified: Secondary | ICD-10-CM

## 2022-02-16 DIAGNOSIS — E782 Mixed hyperlipidemia: Secondary | ICD-10-CM

## 2022-02-16 DIAGNOSIS — Z0000 Encounter for general adult medical examination without abnormal findings: Secondary | ICD-10-CM

## 2022-02-17 LAB — CBC WITH DIFFERENTIAL/PLATELET
Absolute Monocytes: 540 cells/uL (ref 200–950)
Basophils Absolute: 58 cells/uL (ref 0–200)
Basophils Relative: 0.8 %
Eosinophils Absolute: 168 cells/uL (ref 15–500)
Eosinophils Relative: 2.3 %
HCT: 38.9 % (ref 35.0–45.0)
Hemoglobin: 12.9 g/dL (ref 11.7–15.5)
Lymphs Abs: 1256 cells/uL (ref 850–3900)
MCH: 28.7 pg (ref 27.0–33.0)
MCHC: 33.2 g/dL (ref 32.0–36.0)
MCV: 86.6 fL (ref 80.0–100.0)
MPV: 11.2 fL (ref 7.5–12.5)
Monocytes Relative: 7.4 %
Neutro Abs: 5278 cells/uL (ref 1500–7800)
Neutrophils Relative %: 72.3 %
Platelets: 291 10*3/uL (ref 140–400)
RBC: 4.49 10*6/uL (ref 3.80–5.10)
RDW: 13.3 % (ref 11.0–15.0)
Total Lymphocyte: 17.2 %
WBC: 7.3 10*3/uL (ref 3.8–10.8)

## 2022-02-17 LAB — COMPREHENSIVE METABOLIC PANEL
AG Ratio: 1.7 (calc) (ref 1.0–2.5)
ALT: 12 U/L (ref 6–29)
AST: 14 U/L (ref 10–35)
Albumin: 4.1 g/dL (ref 3.6–5.1)
Alkaline phosphatase (APISO): 72 U/L (ref 37–153)
BUN: 13 mg/dL (ref 7–25)
CO2: 31 mmol/L (ref 20–32)
Calcium: 9.8 mg/dL (ref 8.6–10.4)
Chloride: 99 mmol/L (ref 98–110)
Creat: 0.85 mg/dL (ref 0.60–0.95)
Globulin: 2.4 g/dL (calc) (ref 1.9–3.7)
Glucose, Bld: 101 mg/dL — ABNORMAL HIGH (ref 65–99)
Potassium: 3.6 mmol/L (ref 3.5–5.3)
Sodium: 141 mmol/L (ref 135–146)
Total Bilirubin: 0.5 mg/dL (ref 0.2–1.2)
Total Protein: 6.5 g/dL (ref 6.1–8.1)

## 2022-02-17 LAB — LIPID PANEL
Cholesterol: 199 mg/dL (ref <200)
HDL: 50 mg/dL (ref >=50)
LDL Cholesterol (Calc): 118 mg/dL (calc) — ABNORMAL HIGH
Non-HDL Cholesterol (Calc): 149 mg/dL (calc) — ABNORMAL HIGH (ref <130)
Total CHOL/HDL Ratio: 4 (calc) (ref <5.0)
Triglycerides: 191 mg/dL — ABNORMAL HIGH (ref <150)

## 2022-02-17 LAB — HEMOGLOBIN A1C
Hgb A1c MFr Bld: 6.1 % of total Hgb — ABNORMAL HIGH (ref <5.7)
Mean Plasma Glucose: 128 mg/dL
eAG (mmol/L): 7.1 mmol/L

## 2022-02-17 LAB — TSH: TSH: 2.72 mIU/L (ref 0.40–4.50)

## 2022-02-20 DIAGNOSIS — J449 Chronic obstructive pulmonary disease, unspecified: Secondary | ICD-10-CM | POA: Diagnosis not present

## 2022-02-23 ENCOUNTER — Encounter: Payer: Self-pay | Admitting: Family Medicine

## 2022-02-23 ENCOUNTER — Ambulatory Visit (INDEPENDENT_AMBULATORY_CARE_PROVIDER_SITE_OTHER): Payer: Medicare Other | Admitting: Family Medicine

## 2022-02-23 VITALS — BP 132/59 | HR 84 | Ht 64.0 in | Wt 202.2 lb

## 2022-02-23 DIAGNOSIS — N182 Chronic kidney disease, stage 2 (mild): Secondary | ICD-10-CM | POA: Diagnosis not present

## 2022-02-23 DIAGNOSIS — G629 Polyneuropathy, unspecified: Secondary | ICD-10-CM | POA: Diagnosis not present

## 2022-02-23 DIAGNOSIS — I129 Hypertensive chronic kidney disease with stage 1 through stage 4 chronic kidney disease, or unspecified chronic kidney disease: Secondary | ICD-10-CM | POA: Diagnosis not present

## 2022-02-23 DIAGNOSIS — E669 Obesity, unspecified: Secondary | ICD-10-CM

## 2022-02-23 DIAGNOSIS — M159 Polyosteoarthritis, unspecified: Secondary | ICD-10-CM

## 2022-02-23 DIAGNOSIS — E66811 Obesity, class 1: Secondary | ICD-10-CM

## 2022-02-23 DIAGNOSIS — F3342 Major depressive disorder, recurrent, in full remission: Secondary | ICD-10-CM

## 2022-02-23 DIAGNOSIS — R7303 Prediabetes: Secondary | ICD-10-CM

## 2022-02-23 DIAGNOSIS — J432 Centrilobular emphysema: Secondary | ICD-10-CM

## 2022-02-23 DIAGNOSIS — M150 Primary generalized (osteo)arthritis: Secondary | ICD-10-CM

## 2022-02-23 DIAGNOSIS — Z23 Encounter for immunization: Secondary | ICD-10-CM

## 2022-02-23 DIAGNOSIS — M5136 Other intervertebral disc degeneration, lumbar region: Secondary | ICD-10-CM

## 2022-02-23 DIAGNOSIS — Z0000 Encounter for general adult medical examination without abnormal findings: Secondary | ICD-10-CM

## 2022-02-23 DIAGNOSIS — H8192 Unspecified disorder of vestibular function, left ear: Secondary | ICD-10-CM

## 2022-02-23 DIAGNOSIS — M51369 Other intervertebral disc degeneration, lumbar region without mention of lumbar back pain or lower extremity pain: Secondary | ICD-10-CM

## 2022-02-23 MED ORDER — AMLODIPINE BESYLATE 5 MG PO TABS: 5.0000 mg | ORAL_TABLET | Freq: Every day | ORAL | 3 refills | Status: DC

## 2022-02-23 MED ORDER — CHLORTHALIDONE 25 MG PO TABS: 25.0000 mg | ORAL_TABLET | Freq: Every day | ORAL | 3 refills | Status: DC

## 2022-02-23 MED ORDER — LOSARTAN POTASSIUM 100 MG PO TABS: 100.0000 mg | ORAL_TABLET | Freq: Every day | ORAL | 3 refills | Status: DC

## 2022-02-23 MED ORDER — MECLIZINE HCL 25 MG PO TABS: 12.5000 mg | ORAL_TABLET | Freq: Three times a day (TID) | ORAL | 3 refills | Status: DC | PRN

## 2022-02-23 NOTE — Progress Notes (Unsigned)
Subjective:    Patient ID: Candace Tapia, female    DOB: 30-Apr-1935, 86 y.o.   MRN: [id number]  Candace Tapia is a 86 y.o. female presenting on 02/23/2022 for Annual Exam   HPI  Here for Annual Physical and Lab Reivew.   Abdominal Bloating / Functional GI Admits she may eat breakfast when not always hungry, she tries to avoid overeating. She states it is not linked to any one particular meal. - Not drinking any water, she drinks mostly mountain dew often.   Pre-Diabetes / obesity BMI >35 Reports no concerns. Last A1c trend 5.6 Doing well Meds: Never on meds Limited diet and exercise, admits difficulty exercising due to back hip pain arthritis Denies hypoglycemia, polyuria, visual changes, numbness or tingling.   CHRONIC HTN / Peripheral Edema / Hypokalemia Improved BP control and edema Current Meds - Losartan '100mg'$  daily, Chlorthalidone '25mg'$  daily, Amlodipine '5mg'$  daily   Reports good compliance, took meds today. Taking Potassium 90mq daily now   Osteoarthritis, multiple joints Chronic joint pain multiple joints. Prior history of shoulder replacement surgery in past She had been on Hydrocodone years ago and did well only short term. She takes Tylenol Ext STr '500mg'$  x 2 twice a day asking about taking additional dose.   Chronic Vest[facility] She has had some dizziness episodes lightheadedness On Meclizine PRN - She has had some worsening dizziness at times.  *** Reduced portions intake Drinking Mtn Dew and Coffee x 2 cups, drinks some water   Recurrent Depression, - in remission Reports she has prior history of some depressed mood but overall has managed to cope with it for long time and currently doing well. She says she is alone. She has limited family members around. She has some friends, one with alzheimer's dementia. Cannot tolerate medication, remains off    Centrilobular Emphysema (COPD) Followed by Dr FMinerva AreolaPulm On  oxygen at night, nocturnal up to 2L Using inhalers Continue Trelegy She is doing well but often can get short of breath with exertion, worse with mask Has some swelling.   Health Maintenance: Flu Shot     02/23/2022    2:01 PM 08/31/2021    1:50 PM 07/27/2021    3:27 PM  Depression screen PHQ 2/9  Decreased Interest '3 1 1  '$ Down, Depressed, Hopeless '3 1 1  '$ PHQ - 2 Score '6 2 2  '$ Altered sleeping '3 1 1  '$ Tired, decreased energy '3 1 1  '$ Change in appetite 3 0 0  Feeling bad or failure about yourself  3 0 0  Trouble concentrating  0 0  Moving slowly or fidgety/restless 0 0 0  Suicidal thoughts 0 0 0  PHQ-9 Score '18 4 4  '$ Difficult doing work/chores Extremely dIfficult Not difficult at all Not difficult at all    Past Medical History:  Diagnosis Date   Allergy    Arthritis    [place]   rad[place] and taking exemestane   [place]   GERD (gastroesophageal reflux disease)    H/O: hysterectomy    History of lumpectomy 2014   LT breast   Past Surgical History:  Procedure Laterality Date   ABDOMINAL HYSTERECTOMY     BREAST LUMPECTOMY Left 2014   BREAST SURGERY Left 2014   JOINT REPLACEMENT     LYMPH NODE DISSECTION     neck   TOTAL SHOULDER REPLACEMENT Bilateral 2010 and 2013   Social  History   Socioeconomic History   Marital status: Widowed    Spouse name: Not on file   Number of children: 2   Years of education: Not on file   Highest education level: 8th grade  Occupational History   Occupation: Retired  Tobacco Use   Smoking status: Former    Packs/day: 2.00    Years: 40.00    Total pack years: 80.00    Types: Cigarettes    Quit date: 04/19/1993    Years since quitting: 28.8   Smokeless tobacco: Never  Vaping Use   Vaping Use: Not on file  Substance and Sexual Activity   Alcohol use: No    Alcohol/week: 0.0 standard drinks of alcohol   Drug use: No   Sexual activity: Not Currently  Other Topics Concern   Not on  file  Social History Narrative   Not on file   Social Determinants of Health   Financial Resource Strain: Low Risk  (07/27/2021)   Overall Financial Resource Strain (CARDIA)    Difficulty of Paying Living Expenses: Not hard at all  Food Insecurity: No Food Insecurity (12/28/2020)   Hunger Vital Sign    Worried About Running Out of Food in the Last Year: Never true    [place] in the Last Year: Never true  Transportation Needs: No Transportation Needs (07/27/2021)   PRAPARE - Hydrologist (Medical): No    Lack of Transportation (Non-Medical): No  Physical Activity: Inactive (12/28/2020)   Exercise Vital Sign    Days of Exercise per Week: 0 days    Minutes of Exercise per Session: 0 min  Stress: No Stress Concern Present (07/27/2021)   [place]    Feeling of Stress : Not at all  Social Connections: Socially Isolated (12/28/2020)   Social Connection and Isolation Panel [NHANES]    Frequency of Communication with Friends and Family: More than three times a week    Frequency of Social Gatherings with Friends and Family: Twice a week    Attends Religious Services: Never    Marine scientist or Organizations: No    Attends Archivist Meetings: Never    Marital Status: Widowed  Intimate Partner Violence: Not At Risk (07/27/2021)   Humiliation, Afraid, Rape, and Kick questionnaire    Fear of Current or Ex-Partner: No    Emotionally Abused: No    Physically Abused: No    Sexually Abused: No   Family History  Problem Relation Age of Onset   Breast cancer Mother 13   Diabetes Mother    Kidney disease Father    Diabetes Father    Breast cancer Sister 14   Current Outpatient Medications on File Prior to Visit  Medication Sig   acetaminophen (TYLENOL) 650 MG CR tablet Take 650 mg by mouth every 8 (eight) hours as needed for pain.   albuterol (VENTOLIN HFA) 108 (90 Base)  MCG/ACT inhaler Inhale 1 puff into the lungs every 6 (six) hours as needed.    amLODipine (NORVASC) 5 MG tablet TAKE 1 TABLET(5 MG) BY MOUTH DAILY   aspirin 81 MG chewable tablet Chew 81 mg by mouth daily as needed.   chlorthalidone (HYGROTON) 25 MG tablet TAKE 1 TABLET(25 MG) BY MOUTH DAILY   Cholecalciferol 25 MCG (1000 UT) capsule Vitamin D3 25 mcg (1,000 unit) capsule  1 po qd   fluticasone (FLONASE) 50 MCG/ACT nasal spray Place 1  spray into both nostrils as needed.    gabapentin (NEURONTIN) 400 MG capsule TAKE 1 CAPSULE(400 MG) BY MOUTH TWICE DAILY   losartan (COZAAR) 100 MG tablet TAKE 1 TABLET(100 MG) BY MOUTH DAILY   meclizine (ANTIVERT) 25 MG tablet Take 0.5 tablets (12.5 mg total) by mouth every 8 (eight) hours as needed for dizziness.   meloxicam (MOBIC) 7.5 MG tablet Take 7.5 mg by mouth 3 (three) times daily.   Multiple Vitamins-Minerals (MULTIVITAMIN WOMEN 50+) TABS Take 1 tablet by mouth daily.    NEXIUM 20 MG capsule Take 1 capsule (20 mg total) by mouth daily.   potassium chloride (MICRO-K) 10 MEQ CR capsule TAKE 1 CAPSULE(10 MEQ) BY MOUTH DAILY. MAY OPEN CAPSULE AND SPRINKLE OVER SOFT FOOD OR. SWALLOW WHOLE   Probiotic Product (PROBIOTIC DAILY PO) Take by mouth.   Spacer/Aero-Holding Chambers (AEROCHAMBER PLUS) inhaler Use as instructed   dicyclomine (BENTYL) 10 MG capsule Take 1 capsule (10 mg total) by mouth 4 (four) times daily -  before meals and at bedtime. Take as needed for abdominal bloating / cramping symptoms (Patient not taking: Reported on 09/27/2021)   [DISCONTINUED] fexofenadine (ALLEGRA ALLERGY) 180 MG tablet Take 180 mg by mouth as needed. Reported on 09/29/2015   No current facility-administered medications on file prior to visit.    Review of Systems  Constitutional:  Negative for activity change, appetite change, chills, diaphoresis, fatigue and fever.  HENT:  Negative for congestion and hearing loss.   Eyes:  Negative for visual disturbance.  Respiratory:   Negative for cough, chest tightness, shortness of breath and wheezing.   Cardiovascular:  Negative for chest pain, palpitations and leg swelling.  Gastrointestinal:  Negative for abdominal pain, constipation, diarrhea, nausea and vomiting.  Genitourinary:  Negative for dysuria, frequency and hematuria.  Musculoskeletal:  Negative for arthralgias and neck pain.  Skin:  Negative for rash.  Neurological:  Negative for dizziness, weakness, light-headedness, numbness and headaches.  Hematological:  Negative for adenopathy.  Psychiatric/Behavioral:  Negative for behavioral problems, dysphoric mood and sleep disturbance.    Per HPI unless specifically indicated above      Objective:    BP (!) 132/59   Pulse 84   Ht '5\' 4"'$  (1.626 m)   Wt 202 lb 3.2 oz (91.7 kg)   SpO2 95%   BMI 34.71 kg/m   Wt Readings from Last 3 Encounters:  02/23/22 202 lb 3.2 oz (91.7 kg)  08/31/21 204 lb 3.2 oz (92.6 kg)  08/26/21 206 lb (93.4 kg)    Physical Exam Vitals and nursing note reviewed.  Constitutional:      General: She is not in acute distress.    Appearance: She is well-developed. She is not diaphoretic.     Comments: Well-appearing, comfortable, cooperative  HENT:     Head: Normocephalic and atraumatic.  Eyes:     General:        Right eye: No discharge.        Left eye: No discharge.     Conjunctiva/sclera: Conjunctivae normal.     Pupils: Pupils are equal, round, and reactive to light.  Neck:     Thyroid: No thyromegaly.     Vascular: No carotid bruit.  Cardiovascular:     Rate and Rhythm: Normal rate and regular rhythm.     Pulses: Normal pulses.     Heart sounds: Normal heart sounds. No murmur heard. Pulmonary:     Effort: Pulmonary effort is normal. No respiratory distress.  Breath sounds: Normal breath sounds. No wheezing or rales.  Abdominal:     General: Bowel sounds are normal. There is no distension.     Palpations: Abdomen is soft. There is no mass.     Tenderness:  There is no abdominal tenderness.  Musculoskeletal:        General: No tenderness. Normal range of motion.     Cervical back: Normal range of motion and neck supple.     Right lower leg: No edema.     Left lower leg: No edema.     Comments: Upper / Lower Extremities: - Normal muscle tone, strength bilateral upper extremities 5/5, lower extremities 5/5  Lymphadenopathy:     Cervical: No cervical adenopathy.  Skin:    General: Skin is warm and dry.     Findings: No erythema or rash.  Neurological:     Mental Status: She is alert and oriented to person, place, and time.     Comments: Distal sensation intact to light touch all extremities  Psychiatric:        Mood and Affect: Mood normal.        Behavior: Behavior normal.        Thought Content: Thought content normal.     Comments: Well groomed, good eye contact, normal speech and thoughts      Results for orders placed or performed in visit on 02/16/22  CBC with Differential  Result Value Ref Range   WBC 7.3 3.8 - 10.8 Thousand/uL   RBC 4.49 3.80 - 5.10 Million/uL   Hemoglobin 12.9 11.7 - 15.5 g/dL   HCT 38.9 35.0 - 45.0 %   MCV 86.6 80.0 - 100.0 fL   MCH 28.7 27.0 - 33.0 pg   MCHC 33.2 32.0 - 36.0 g/dL   RDW 13.3 11.0 - 15.0 %   Platelets 291 140 - 400 Thousand/uL   MPV 11.2 7.5 - 12.5 fL   Neutro Abs 5,278 1,500 - 7,800 cells/uL   Lymphs Abs 1,256 850 - 3,900 cells/uL   Absolute Monocytes 540 200 - 950 cells/uL   Eosinophils Absolute 168 15 - 500 cells/uL   Basophils Absolute 58 0 - 200 cells/uL   Neutrophils Relative % 72.3 %   Total Lymphocyte 17.2 %   Monocytes Relative 7.4 %   Eosinophils Relative 2.3 %   Basophils Relative 0.8 %  Comprehensive Metabolic Panel (CMET)  Result Value Ref Range   Glucose, Bld 101 (H) 65 - 99 mg/dL   BUN 13 7 - 25 mg/dL   Creat 0.85 0.60 - 0.95 mg/dL   BUN/Creatinine Ratio SEE NOTE: 6 - 22 (calc)   Sodium 141 135 - 146 mmol/L   Potassium 3.6 3.5 - 5.3 mmol/L   Chloride 99 98 -  110 mmol/L   CO2 31 20 - 32 mmol/L   Calcium 9.8 8.6 - 10.4 mg/dL   Total Protein 6.5 6.1 - 8.1 g/dL   Albumin 4.1 3.6 - 5.1 g/dL   Globulin 2.4 1.9 - 3.7 g/dL (calc)   AG Ratio 1.7 1.0 - 2.5 (calc)   Total Bilirubin 0.5 0.2 - 1.2 mg/dL   Alkaline phosphatase (APISO) 72 37 - 153 U/L   AST 14 10 - 35 U/L   ALT 12 6 - 29 U/L  HgB A1c  Result Value Ref Range   Hgb A1c MFr Bld 6.1 (H) <5.7 % of total Hgb   Mean Plasma Glucose 128 mg/dL   eAG (mmol/L) 7.1 mmol/L  Lipid panel  Result Value Ref Range   Cholesterol 199 <200 mg/dL   HDL 50 > OR = 50 mg/dL   Triglycerides 191 (H) <150 mg/dL   LDL Cholesterol (Calc) 118 (H) mg/dL (calc)   Total CHOL/HDL Ratio 4.0 <5.0 (calc)   Non-HDL Cholesterol (Calc) 149 (H) <130 mg/dL (calc)  TSH  Result Value Ref Range   TSH 2.72 0.40 - 4.50 mIU/L      Assessment & Plan:   Problem List Items Addressed This Visit     Benign hypertension with CKD (chronic kidney disease), stage II   Centrilobular emphysema (HCC)   DDD (degenerative disc disease), lumbar   Relevant Medications   meloxicam (MOBIC) 7.5 MG tablet   Major depression in full remission (HCC)   Obesity (BMI 30.0-34.9)   Osteoarthritis of multiple joints   Relevant Medications   meloxicam (MOBIC) 7.5 MG tablet   Peripheral polyneuropathy   Pre-diabetes   Vestibular disorder   Other Visit Diagnoses     Annual physical exam    -  Primary   Needs flu shot       Relevant Orders   Flu Vaccine QUAD High Dose(Fluad)       Updated Health Maintenance information Reviewed recent lab results with patient Encouraged improvement to lifestyle with diet and exercise Goal of weight loss     No orders of the defined types were placed in this encounter.     Follow up plan: No follow-ups on file.  Nobie Putnam, DO [facility] 02/23/2022, 2:10 PM

## 2022-02-23 NOTE — Patient Instructions (Addendum)
Thank you for coming to the office today.  Flu Shot today  Future RSV Vaccine if interested. Can consider at pharmacy.  All med refills ordered.  Meclizine dizzy pill, instead of dramamine.  Try to limit Mtn Dew and increase water intake. Okay with the sweets, the A1c 6.1 is very mild.   Please schedule a Follow-up Appointment to: Return in about 6 months (around 08/24/2022) for 6 month follow-up PreDM A1c, Emphysema, HTN, CKD.  If you have any other questions or concerns, please feel free to call the office or send a message through [place]. You may also schedule an earlier appointment if necessary.  Additionally, you may be receiving a survey about your experience at our office within a few days to 1 week by e-mail or mail. We value your feedback.  Candace Putnam, DO [place]

## 2022-02-24 NOTE — Assessment & Plan Note (Signed)
Controlled HTN  Stable K Some dizziness is chronic, seems unrelated to medication side effect or HTN - Home BP readings improved Complication with CKD-II    Plan:  Continue current BP regimen Losartan '100mg'$  daily, Amlodipine '5mg'$ , Chlorthalidone '25mg'$  daily Encourage improved lifestyle - low sodium diet, regular exercise Continue monitor BP outside office, bring readings to next visit, if persistently >140/90 or new symptoms notify office sooner

## 2022-03-02 DIAGNOSIS — M47896 Other spondylosis, lumbar region: Secondary | ICD-10-CM | POA: Diagnosis not present

## 2022-03-22 DIAGNOSIS — J449 Chronic obstructive pulmonary disease, unspecified: Secondary | ICD-10-CM | POA: Diagnosis not present

## 2022-03-23 ENCOUNTER — Ambulatory Visit: Payer: Self-pay | Admitting: *Deleted

## 2022-03-23 NOTE — Patient Outreach (Signed)
  Care Coordination   03/23/2022 Name: Candace Tapia MRN: [id number] DOB: 1935/12/26   Care Coordination Outreach Attempts:  An unsuccessful telephone outreach was attempted for a scheduled appointment today.  Follow Up Plan:  Additional outreach attempts will be made to offer the patient care coordination information and services.   Encounter Outcome:  No Answer   Care Coordination Interventions:  No, not indicated    Valente David, RN, MSN, [facility] Care Management Care Management Coordinator [phone]

## 2022-04-06 ENCOUNTER — Ambulatory Visit: Payer: Self-pay | Admitting: *Deleted

## 2022-04-06 NOTE — Patient Outreach (Signed)
  Care Coordination   Follow Up Visit Note   04/06/2022 Name: Candace Tapia MRN: [id number] DOB: 06-11-1935  Candace Tapia is a 86 y.o. year old female who sees Olin Hauser, DO for primary care. I spoke with  Candace Tapia by phone today.  What matters to the patients health and wellness today?  "I will always have breathing problems."  Currently managed.     Goals Addressed             This Visit's Progress    COMPLETED: RNCM: [place] Management of Depression and Anxiety       Care Coordination Interventions: Evaluation of current treatment plan related to depression and anxiety and patient's adherence to plan as established by provider. The patient lives alone but her sons and daughter in law come in to see her. Advised patient to call the office for changes in mood, anxiety, depression, or mental health Provided education to patient re: resources to help with socialization, working with the resources available to help express her anxiety and loneliness  Discussed plans with patient for ongoing care management follow up and provided patient with direct contact information for care management team Advised patient to discuss changes in her health and wellbeing  with provider  Active listening / Reflection utilized  Emotional Support Provided  12/20 - No concern for depression today     RNCM: Effective managment of COPD   On track    Care Coordination Interventions: Provided patient with basic written and verbal COPD education on self care/management/and exacerbation prevention Advised patient to track and manage COPD triggers Provided instruction about proper use of medications used for management of COPD including inhalers Advised patient to self assesses COPD action plan zone and make appointment with provider if in the yellow zone for 48 hours without improvement Advised patient to engage in light exercise as tolerated 3-5 days a week to aid in the the  management of COPD Provided education about and advised patient to utilize infection prevention strategies to reduce risk of respiratory infection Discussed the importance of adequate rest and management of fatigue with COPD Has some shortness of breath today, but she was in the kitchen and rushed to the phone.  Denies chest discomfort           SDOH assessments and interventions completed:  No     Care Coordination Interventions:  Yes, provided   Follow up plan: Follow up call scheduled for 3/7    Encounter Outcome:  Pt. Visit Completed   Valente David, RN, MSN, [place] Care Management Care Management Coordinator [phone]

## 2022-04-15 ENCOUNTER — Other Ambulatory Visit: Payer: Self-pay | Admitting: Family Medicine

## 2022-04-15 DIAGNOSIS — E876 Hypokalemia: Secondary | ICD-10-CM

## 2022-04-15 NOTE — Telephone Encounter (Signed)
Unable to refill per protocol, request is too soon. Last refill 04/20/21 for 90 days and 3 refills. Will refuse.  Requested Prescriptions  Pending Prescriptions Disp Refills   potassium chloride (MICRO-K) 10 MEQ CR capsule [Pharmacy Med Name: POTASSIUM CL 10MEQ ER CAPSULES] 90 capsule 3    Sig: TAKE 1 CAPSULE(10 MEQ) BY MOUTH DAILY. MAY OPEN CAPSULE AND SPRINKLE OVER SOFT FOOD OR. [place] WHOLE     Endocrinology:  Minerals - Potassium Supplementation Passed - 04/15/2022 10:11 AM      Passed - K in normal range and within 360 days    Potassium  Date Value Ref Range Status  02/16/2022 3.6 3.5 - 5.3 mmol/L Final  04/28/2014 4.1 3.5 - 5.1 mmol/L Final         Passed - Cr in normal range and within 360 days    Creat  Date Value Ref Range Status  02/16/2022 0.85 0.60 - 0.95 mg/dL Final         Passed - Valid encounter within last 12 months    Recent Outpatient Visits           1 month ago Annual physical exam   [place], DO   7 months ago Postprandial abdominal bloating   [place], DO   7 months ago Centrilobular emphysema [facility])   [facility] Olin Hauser, DO   1 year ago Annual physical exam   [facility] Olin Hauser, DO   1 year ago [place], DO       Future Appointments             In 4 months [place], Devonne Doughty, [facility], [facility]

## 2022-04-22 DIAGNOSIS — J449 Chronic obstructive pulmonary disease, unspecified: Secondary | ICD-10-CM | POA: Diagnosis not present

## 2022-05-23 DIAGNOSIS — J449 Chronic obstructive pulmonary disease, unspecified: Secondary | ICD-10-CM | POA: Diagnosis not present

## 2022-06-01 DIAGNOSIS — M47896 Other spondylosis, lumbar region: Secondary | ICD-10-CM | POA: Diagnosis not present

## 2022-06-11 ENCOUNTER — Other Ambulatory Visit: Payer: Self-pay | Admitting: Family Medicine

## 2022-06-11 DIAGNOSIS — N182 Chronic kidney disease, stage 2 (mild): Secondary | ICD-10-CM

## 2022-06-13 NOTE — Telephone Encounter (Signed)
Refused [place] 25 mg because it's being requested too soon.  On 02/23/2022 #90, 3 refills was given.

## 2022-06-14 ENCOUNTER — Other Ambulatory Visit: Payer: Self-pay | Admitting: Family Medicine

## 2022-06-14 DIAGNOSIS — J449 Chronic obstructive pulmonary disease, unspecified: Secondary | ICD-10-CM | POA: Diagnosis not present

## 2022-06-14 DIAGNOSIS — G4734 Idiopathic sleep related nonobstructive alveolar hypoventilation: Secondary | ICD-10-CM | POA: Diagnosis not present

## 2022-06-14 DIAGNOSIS — R0609 Other forms of dyspnea: Secondary | ICD-10-CM | POA: Diagnosis not present

## 2022-06-14 DIAGNOSIS — E876 Hypokalemia: Secondary | ICD-10-CM

## 2022-06-14 NOTE — Telephone Encounter (Signed)
Duplicate request. Expired medication Requested Prescriptions  Refused Prescriptions Disp Refills   potassium chloride (MICRO-K) 10 MEQ CR capsule [Pharmacy Med Name: POTASSIUM CL 10MEQ ER CAPSULES] 90 capsule 3    Sig: TAKE 1 CAPSULE(10 MEQ) BY MOUTH DAILY. MAY OPEN CAPSULE AND SPRINKLE OVER SOFT FOOD OR. [place] WHOLE     Endocrinology:  Minerals - Potassium Supplementation Passed - 06/14/2022 11:40 AM      Passed - K in normal range and within 360 days    Potassium  Date Value Ref Range Status  02/16/2022 3.6 3.5 - 5.3 mmol/L Final  04/28/2014 4.1 3.5 - 5.1 mmol/L Final         Passed - Cr in normal range and within 360 days    Creat  Date Value Ref Range Status  02/16/2022 0.85 0.60 - 0.95 mg/dL Final         Passed - Valid encounter within last 12 months    Recent Outpatient Visits           3 months ago Annual physical exam   [place], DO   9 months ago Postprandial abdominal bloating   [facility] Olin Hauser, DO   9 months ago Centrilobular emphysema [facility])   [facility] Olin Hauser, DO   1 year ago Annual physical exam   [facility] Olin Hauser, DO   1 year ago [facility] Parks Ranger, Devonne Doughty, DO       Future Appointments             In 2 months [place], Devonne Doughty, [facility], [facility]

## 2022-06-14 NOTE — Telephone Encounter (Signed)
Called patient to inform medication requested has expired and will send request for renewal to PCP to get 90 day supply. Patient verbalized understanding.

## 2022-06-14 NOTE — Telephone Encounter (Signed)
Requested medication (s) are due for refill today: expired medication  Requested medication (s) are on the active medication list: yes  Last refill:  04/20/21 #90 3 refills  Future visit scheduled: yes in 2 months  Notes to clinic:  expired medication. Do you want to renew Rx?     Requested Prescriptions  Pending Prescriptions Disp Refills   potassium chloride (MICRO-K) 10 MEQ CR capsule [Pharmacy Med Name: POTASSIUM CL 10MEQ ER CAPSULES] 90 capsule 3    Sig: TAKE 1 CAPSULE(10 MEQ) BY MOUTH DAILY. MAY OPEN CAPSULE AND SPRINKLE OVER SOFT FOOD OR. [place] WHOLE     Endocrinology:  Minerals - Potassium Supplementation Passed - 06/14/2022 11:40 AM      Passed - K in normal range and within 360 days    Potassium  Date Value Ref Range Status  02/16/2022 3.6 3.5 - 5.3 mmol/L Final  04/28/2014 4.1 3.5 - 5.1 mmol/L Final         Passed - Cr in normal range and within 360 days    Creat  Date Value Ref Range Status  02/16/2022 0.85 0.60 - 0.95 mg/dL Final         Passed - Valid encounter within last 12 months    Recent Outpatient Visits           3 months ago Annual physical exam   [place], DO   9 months ago Postprandial abdominal bloating   [facility] Olin Hauser, DO   9 months ago Centrilobular emphysema [facility])   [facility] Olin Hauser, DO   1 year ago Annual physical exam   [facility] Olin Hauser, DO   1 year ago [facility] Parks Ranger, Devonne Doughty, DO       Future Appointments             In 2 months [place], Devonne Doughty, [facility], [facility]

## 2022-06-14 NOTE — Telephone Encounter (Signed)
Pt is calling to follow up on medication refill.   Please advise.

## 2022-06-17 ENCOUNTER — Emergency Department
Admission: EM | Admit: 2022-06-17 | Discharge: 2022-06-17 | Disposition: A | Discharge status: Home / Self Care | Payer: Medicare Other | Attending: Emergency Medicine | Admitting: Emergency Medicine

## 2022-06-17 ENCOUNTER — Emergency Department: Payer: Medicare Other

## 2022-06-17 ENCOUNTER — Other Ambulatory Visit: Payer: Self-pay

## 2022-06-17 DIAGNOSIS — E860 Dehydration: Secondary | ICD-10-CM | POA: Diagnosis not present

## 2022-06-17 DIAGNOSIS — R404 Transient alteration of awareness: Secondary | ICD-10-CM | POA: Diagnosis not present

## 2022-06-17 DIAGNOSIS — R0602 Shortness of breath: Secondary | ICD-10-CM | POA: Diagnosis not present

## 2022-06-17 DIAGNOSIS — R197 Diarrhea, unspecified: Secondary | ICD-10-CM | POA: Diagnosis not present

## 2022-06-17 DIAGNOSIS — R1111 Vomiting without nausea: Secondary | ICD-10-CM | POA: Diagnosis not present

## 2022-06-17 DIAGNOSIS — J449 Chronic obstructive pulmonary disease, unspecified: Secondary | ICD-10-CM | POA: Insufficient documentation

## 2022-06-17 DIAGNOSIS — N189 Chronic kidney disease, unspecified: Secondary | ICD-10-CM | POA: Diagnosis not present

## 2022-06-17 DIAGNOSIS — R110 Nausea: Secondary | ICD-10-CM | POA: Diagnosis not present

## 2022-06-17 DIAGNOSIS — R944 Abnormal results of kidney function studies: Secondary | ICD-10-CM | POA: Diagnosis not present

## 2022-06-17 DIAGNOSIS — Z743 Need for continuous supervision: Secondary | ICD-10-CM | POA: Diagnosis not present

## 2022-06-17 LAB — COMPREHENSIVE METABOLIC PANEL
ALT: 13 U/L (ref 0–44)
AST: 16 U/L (ref 15–41)
Albumin: 3.8 g/dL (ref 3.5–5.0)
Alkaline Phosphatase: 74 U/L (ref 38–126)
Anion gap: 11 (ref 5–15)
BUN: 28 mg/dL — ABNORMAL HIGH (ref 8–23)
CO2: 27 mmol/L (ref 22–32)
Calcium: 9.2 mg/dL (ref 8.9–10.3)
Chloride: 101 mmol/L (ref 98–111)
Creatinine, Ser: 1.23 mg/dL — ABNORMAL HIGH (ref 0.44–1.00)
GFR, Estimated: 43 mL/min — ABNORMAL LOW (ref >60)
Glucose, Bld: 117 mg/dL — ABNORMAL HIGH (ref 70–99)
Potassium: 2.9 mmol/L — ABNORMAL LOW (ref 3.5–5.1)
Sodium: 139 mmol/L (ref 135–145)
Total Bilirubin: 0.8 mg/dL (ref 0.3–1.2)
Total Protein: 7.5 g/dL (ref 6.5–8.1)

## 2022-06-17 LAB — CBC
HCT: 40.3 % (ref 36.0–46.0)
Hemoglobin: 12.5 g/dL (ref 12.0–15.0)
MCH: 28 pg (ref 26.0–34.0)
MCHC: 31 g/dL (ref 30.0–36.0)
MCV: 90.2 fL (ref 80.0–100.0)
Platelets: 288 10*3/uL (ref 150–400)
RBC: 4.47 MIL/uL (ref 3.87–5.11)
RDW: 13.9 % (ref 11.5–15.5)
WBC: 8.1 10*3/uL (ref 4.0–10.5)
nRBC: 0 % (ref 0.0–0.2)

## 2022-06-17 LAB — LIPASE, BLOOD: Lipase: 23 U/L (ref 11–51)

## 2022-06-17 MED ORDER — ONDANSETRON 4 MG PO TBDP: 4.0000 mg | ORAL_TABLET | Freq: Three times a day (TID) | ORAL | 0 refills | Status: DC | PRN

## 2022-06-17 MED ORDER — ONDANSETRON HCL 4 MG/2ML IJ SOLN
4.0000 mg | Freq: Once | INTRAMUSCULAR | Status: DC
  Filled 2022-06-17: qty 2

## 2022-06-17 MED ORDER — POTASSIUM CHLORIDE CRYS ER 20 MEQ PO TBCR
60.0000 meq | EXTENDED_RELEASE_TABLET | Freq: Once | ORAL | Status: AC
  Administered 2022-06-17: 60 meq via ORAL
  Filled 2022-06-17: qty 3

## 2022-06-17 MED ORDER — POTASSIUM CHLORIDE ER 10 MEQ PO TBCR: 20.0000 meq | EXTENDED_RELEASE_TABLET | Freq: Two times a day (BID) | ORAL | 0 refills | Status: DC

## 2022-06-17 MED ORDER — SODIUM CHLORIDE 0.9 % IV BOLUS: 1000.0000 mL | Freq: Once | INTRAVENOUS | Status: DC

## 2022-06-17 MED ORDER — ONDANSETRON 4 MG PO TBDP
8.0000 mg | ORAL_TABLET | Freq: Once | ORAL | Status: AC
  Administered 2022-06-17: 8 mg via ORAL
  Filled 2022-06-17: qty 2

## 2022-06-17 MED ORDER — PANTOPRAZOLE SODIUM 40 MG IV SOLR
40.0000 mg | Freq: Once | INTRAVENOUS | Status: DC
  Filled 2022-06-17: qty 10

## 2022-06-17 MED ORDER — FAMOTIDINE 20 MG PO TABS: 20.0000 mg | ORAL_TABLET | Freq: Two times a day (BID) | ORAL | 0 refills | Status: DC

## 2022-06-17 MED ORDER — POTASSIUM CHLORIDE 20 MEQ PO PACK: 40.0000 meq | PACK | Freq: Once | ORAL | Status: DC

## 2022-06-17 NOTE — ED Provider Notes (Signed)
[facility] Provider Note    Event Date/Time   First MD Initiated Contact with Patient 06/17/22 1217     (approximate)   History   Chief Complaint: Diarrhea   HPI  Candace Tapia is a 87 y.o. female with a past history of GERD, COPD, CKD, obesity who comes the ED complaining of diarrhea fatigue and lightheadedness with standing, gradual onset over the past few days.  Also having some nausea and vomiting unable to eat since yesterday.  No fever or chills, no chest pain or shortness of breath.  No black or bloody stool.  Diarrhea is watery and clear.  No syncope falls or trauma     Physical Exam   Triage Vital Signs: ED Triage Vitals [06/17/22 1110]  Enc Vitals Group     BP (!) 132/57     Pulse Rate 99     Resp 20     Temp 98 F (36.7 C)     Temp Source Oral     SpO2 94 %     Weight 206 lb (93.4 kg)     Height '5\' 5"'$  (1.651 m)     Head Circumference      Peak Flow      Pain Score 0     Pain Loc      Pain Edu?      Excl. in [place]?     Most recent vital signs: Vitals:   06/17/22 1110 06/17/22 1113  BP: (!) 132/57 (!) 132/57  Pulse: 99 94  Resp: 20 18  Temp: 98 F (36.7 C) 98 F (36.7 C)  SpO2: 94% 95%    General: Awake, no distress.  CV:  Good peripheral perfusion.  Regular rate and rhythm Resp:  Normal effort.  Clear to auscultation bilaterally Abd:  No distention.  Soft nontender Other:  Moist oral mucosa   ED Results / Procedures / Treatments   Labs (all labs ordered are listed, but only abnormal results are displayed) Labs Reviewed  COMPREHENSIVE METABOLIC PANEL - Abnormal; Notable for the following components:      Result Value   Potassium 2.9 (*)    Glucose, Bld 117 (*)    BUN 28 (*)    Creatinine, Ser 1.23 (*)    GFR, Estimated 43 (*)    All other components within normal limits  CBC  LIPASE, BLOOD     EKG Interpreted by me Sinus rhythm rate of 98.  Left axis, normal intervals, poor R wave progression.  Left  bundle branch block.   RADIOLOGY Chest x-ray interpreted by me, appears normal.  Radiology report reviewed   PROCEDURES:  Procedures   MEDICATIONS ORDERED IN ED: Medications  ondansetron (ZOFRAN-ODT) disintegrating tablet 8 mg (has no administration in time range)  potassium chloride SA (KLOR-CON M) CR tablet 60 mEq (has no administration in time range)     IMPRESSION / MDM / ASSESSMENT AND PLAN / ED COURSE  I reviewed the triage vital signs and the nursing notes.  DDx: Dehydration, AKI, electrolyte abnormality, anemia, pancreatitis, gastritis  Patient's presentation is most consistent with acute presentation with potential threat to life or bodily function.  Patient presents with nausea vomiting diarrhea, appears somewhat dehydrated.  Labs are unremarkable except for potassium of 2.9 and mildly elevated creatinine of 1.2 above her baseline of 0.8.  Nursing is having a difficult time getting IV access which the patient reports is usually the case for her.  After a few attempts, the  patient states that she just wants to be discharged and will focus on oral hydration at home.  Patient is nontoxic.  Abdomen is benign.  Will prescribe potassium supplement, Zofran, Pepcid.  Will give Zofran and K-Dur here in the ED prior to discharge.       FINAL CLINICAL IMPRESSION(S) / ED DIAGNOSES   Final diagnoses:  Diarrhea of presumed infectious origin  Dehydration     Rx / DC Orders   ED Discharge Orders          Ordered    potassium chloride (KLOR-CON) 10 MEQ tablet  2 times daily        06/17/22 1313    ondansetron (ZOFRAN-ODT) 4 MG disintegrating tablet  Every 8 hours PRN        06/17/22 1313    famotidine (PEPCID) 20 MG tablet  2 times daily        06/17/22 1313             Note:  This document was prepared using Dragon voice recognition software and may include unintentional dictation errors.   Carrie Mew, MD 06/17/22 [id number]

## 2022-06-17 NOTE — ED Triage Notes (Signed)
PT here with diarrhea for a few days. Pt also having SOB and weakness. Pt denies abd pain but thinks that her abd is swollen.

## 2022-06-17 NOTE — Discharge Instructions (Addendum)
Your lab tests were normal today.  Take medications to help with your nausea symptoms, and drink lots of fluids to stay hydrated.

## 2022-06-17 NOTE — ED Triage Notes (Signed)
First Nurse Note:  Pt via EMS from home. Pt c/o NVD, dehydration, unable to keep any food down since yesterday. Pt is A&Ox4 and NAD. Pt is HOH 92% on RA, 92 HR, 20 RR, 136/74 BP, 99.6

## 2022-06-21 ENCOUNTER — Telehealth: Payer: Self-pay | Admitting: *Deleted

## 2022-06-21 DIAGNOSIS — J449 Chronic obstructive pulmonary disease, unspecified: Secondary | ICD-10-CM | POA: Diagnosis not present

## 2022-06-21 NOTE — Transitions of Care (Post Inpatient/ED Visit) (Signed)
   06/21/2022  Name: Candace Tapia MRN: [id number] DOB: 12/15/1935  Today's TOC FU Call Status: Today's TOC FU Call Status:: Successful TOC FU Call Competed TOC FU Call Complete Date: 06/21/22  Transition Care Management Follow-up Telephone Call Date of Discharge: 06/18/22 Discharge Facility: [facility]) Type of Discharge: Emergency Department [facility] ED) Reason for ED Visit: Other: (diarrhea) How have you been since you were released from the hospital?: Worse Any questions or concerns?: Yes Patient Questions/Concerns:: patient stated she is having so much diarrhea that she doesn't know what to do. it has not gotten any better Patient Questions/Concerns Addressed: Other: (Made a f/u appt with PCP for [id number])  Items Reviewed: Did you receive and understand the discharge instructions provided?: Yes Medications obtained and verified?: Yes (Medications Reviewed) Any new allergies since your discharge?: No Dietary orders reviewed?: No Do you have support at home?: Yes People in Home: child(ren), dependent Name of Support/Comfort Primary Source: [facility] and Equipment/Supplies: [place] Ordered?: No Any new equipment or medical supplies ordered?: No  Functional Questionnaire: Do you need assistance with bathing/showering or dressing?: No Do you need assistance with meal preparation?: No Do you need assistance with eating?: No Do you have difficulty maintaining continence: No Do you need assistance with getting out of bed/getting out of a chair/moving?: No Do you have difficulty managing or taking your medications?: No  Folllow up appointments reviewed: PCP Follow-up appointment confirmed?: Yes (Care guide made follow up appointment) Date of PCP follow-up appointment?: 06/22/22 Follow-up Provider: [facility] Follow-up appointment confirmed?: NA Do you need transportation to your follow-up appointment?:  No Do you understand care options if your condition(s) worsen?: Yes-patient verbalized understanding  SDOH Interventions Today    Flowsheet Row Most Recent Value  SDOH Interventions   Food Insecurity Interventions Intervention Not Indicated  Housing Interventions Intervention Not Indicated  Transportation Interventions Intervention Not Indicated      Interventions Today    Flowsheet Row Most Recent Value  General Interventions   General Interventions Discussed/Reviewed General Interventions Discussed, General Interventions Reviewed, Doctor Visits  Doctor Visits Discussed/Reviewed Doctor Visits Discussed, Doctor Visits Reviewed  Dossie Arbour guide scheduled follow up PCP visit]  Nutrition Interventions   Nutrition Discussed/Reviewed Nutrition Discussed, Fluid intake  [Rn discussed eating soft foods not spicy wit the diarrhea she is having and taking plenty of fluids]  Pharmacy Interventions   Pharmacy Dicussed/Reviewed Pharmacy Topics Discussed, Pharmacy Topics Reviewed  [Rn went over the medications prescribed]     Care guide scheduled F/u PCP visit [id number] 1:20  [place] Management [phone]

## 2022-06-22 ENCOUNTER — Encounter: Payer: Self-pay | Admitting: Family Medicine

## 2022-06-22 ENCOUNTER — Ambulatory Visit (INDEPENDENT_AMBULATORY_CARE_PROVIDER_SITE_OTHER): Payer: Medicare Other | Admitting: Family Medicine

## 2022-06-22 VITALS — BP 114/80 | HR 102 | Temp 96.9°F | Wt 204.0 lb

## 2022-06-22 DIAGNOSIS — R197 Diarrhea, unspecified: Secondary | ICD-10-CM | POA: Diagnosis not present

## 2022-06-22 DIAGNOSIS — E860 Dehydration: Secondary | ICD-10-CM | POA: Diagnosis not present

## 2022-06-22 DIAGNOSIS — E876 Hypokalemia: Secondary | ICD-10-CM | POA: Diagnosis not present

## 2022-06-22 MED ORDER — POTASSIUM CHLORIDE ER 10 MEQ PO CPCR: 10.0000 meq | ORAL_CAPSULE | Freq: Every day | ORAL | 3 refills | Status: DC

## 2022-06-22 NOTE — Progress Notes (Signed)
Subjective:    Patient ID: Candace Tapia, female    DOB: 03-Nov-1935, 87 y.o.   MRN: [id number]  Candace Tapia is a 87 y.o. female presenting on 06/22/2022 for Diarrhea   HPI  ED FOLLOW-UP VISIT  Hospital/Location: [facility] [facility] Date of ED Visit: 06/17/22  Reason for Presenting to ED: Diarrhea, Dehydration  FOLLOW-UP  - ED provider note and record have been reviewed - Patient presents today about 5 days after recent ED visit. Brief summary of recent course, patient had symptoms of severe diarrhea watery and fecal urgency, presented to ED on 06/17/22, testing in ED with lab chemistry showed elevated Creatinine 1.23 and BUN 28, and K was low 2.9, treated with potassium in ED, and discharged to with extra to take. They tried to get an IV but unsuccessful. Advised home rehydration . - Today reports overall has done well after discharge from ED. Symptoms of diarrhea have improved. Has imodium if need. Tried some oral rehydration solution but not taking in as much  - New medications on discharge: Potassium Chloride - Changes to current meds on discharge: none   I have reviewed the discharge medication list, and have reconciled the current and discharge medications today.   Current Outpatient Medications:    acetaminophen (TYLENOL) 650 MG CR tablet, Take 650 mg by mouth every 8 (eight) hours as needed for pain., Disp: , Rfl:    albuterol (VENTOLIN HFA) 108 (90 Base) MCG/ACT inhaler, Inhale 1 puff into the lungs every 6 (six) hours as needed. , Disp: , Rfl:    amLODipine (NORVASC) 5 MG tablet, Take 1 tablet (5 mg total) by mouth daily., Disp: 90 tablet, Rfl: 3   aspirin 81 MG chewable tablet, Chew 81 mg by mouth daily as needed., Disp: , Rfl:    chlorthalidone (HYGROTON) 25 MG tablet, Take 1 tablet (25 mg total) by mouth daily., Disp: 90 tablet, Rfl: 3   Cholecalciferol 25 MCG (1000 UT) capsule, Vitamin D3 25 mcg (1,000 unit) capsule  1 po qd, Disp: , Rfl:    famotidine (PEPCID) 20 MG tablet,  Take 1 tablet (20 mg total) by mouth 2 (two) times daily., Disp: 60 tablet, Rfl: 0   fluticasone (FLONASE) 50 MCG/ACT nasal spray, Place 1 spray into both nostrils as needed. , Disp: , Rfl:    gabapentin (NEURONTIN) 400 MG capsule, TAKE 1 CAPSULE(400 MG) BY MOUTH TWICE DAILY, Disp: 180 capsule, Rfl: 2   losartan (COZAAR) 100 MG tablet, Take 1 tablet (100 mg total) by mouth daily., Disp: 90 tablet, Rfl: 3   meclizine (ANTIVERT) 25 MG tablet, Take 0.5 tablets (12.5 mg total) by mouth every 8 (eight) hours as needed for dizziness., Disp: 90 tablet, Rfl: 3   meloxicam (MOBIC) 7.5 MG tablet, Take 7.5 mg by mouth 3 (three) times daily., Disp: , Rfl:    Multiple Vitamins-Minerals (MULTIVITAMIN WOMEN 50+) TABS, Take 1 tablet by mouth daily. , Disp: , Rfl:    NEXIUM 20 MG capsule, Take 1 capsule (20 mg total) by mouth daily., Disp: 90 capsule, Rfl: 3   ondansetron (ZOFRAN-ODT) 4 MG disintegrating tablet, Take 1 tablet (4 mg total) by mouth every 8 (eight) hours as needed for nausea or vomiting., Disp: 20 tablet, Rfl: 0   potassium chloride (MICRO-K) 10 MEQ CR capsule, Take 1 capsule (10 mEq total) by mouth daily. May open capsule sprinkle on soft food or swallow whole., Disp: 90 capsule, Rfl: 3   Probiotic Product (PROBIOTIC DAILY PO), Take by mouth., Disp: ,  Rfl:    Spacer/Aero-Holding Chambers (AEROCHAMBER PLUS) inhaler, Use as instructed, Disp: 1 each, Rfl: 2   dicyclomine (BENTYL) 10 MG capsule, Take 1 capsule (10 mg total) by mouth 4 (four) times daily -  before meals and at bedtime. Take as needed for abdominal bloating / cramping symptoms (Patient not taking: Reported on 09/27/2021), Disp: 30 capsule, Rfl: 2  ------------------------------------------------------------------------- Social History   Tobacco Use   Smoking status: Former    Packs/day: 2.00    Years: 40.00    Total pack years: 80.00    Types: Cigarettes    Quit date: 04/19/1993    Years since quitting: 29.1   Smokeless tobacco: Never   Substance Use Topics   Alcohol use: No    Alcohol/week: 0.0 standard drinks of alcohol   Drug use: No    Review of Systems Per HPI unless specifically indicated above     Objective:    BP 114/80 (BP Location: Left Arm, Patient Position: Sitting, Cuff Size: Large)   Pulse (!) 102   Temp (!) 96.9 F (36.1 C) (Temporal)   Wt 204 lb (92.5 kg)   SpO2 96%   BMI 33.95 kg/m   Wt Readings from Last 3 Encounters:  06/22/22 204 lb (92.5 kg)  06/17/22 205 lb 14.6 oz (93.4 kg)  02/23/22 202 lb 3.2 oz (91.7 kg)    Physical Exam Vitals and nursing note reviewed.  Constitutional:      General: She is not in acute distress.    Appearance: She is well-developed. She is not diaphoretic.     Comments: Well-appearing, comfortable, cooperative  HENT:     Head: Normocephalic and atraumatic.  Eyes:     General:        Right eye: No discharge.        Left eye: No discharge.     Conjunctiva/sclera: Conjunctivae normal.  Neck:     Thyroid: No thyromegaly.  Cardiovascular:     Rate and Rhythm: Regular rhythm. Tachycardia present.     Heart sounds: Normal heart sounds. No murmur heard. Pulmonary:     Effort: Pulmonary effort is normal. No respiratory distress.     Breath sounds: Normal breath sounds. No wheezing or rales.  Musculoskeletal:        General: Normal range of motion.     Cervical back: Normal range of motion and neck supple.  Lymphadenopathy:     Cervical: No cervical adenopathy.  Skin:    General: Skin is warm and dry.     Findings: No erythema or rash.  Neurological:     Mental Status: She is alert and oriented to person, place, and time.  Psychiatric:        Behavior: Behavior normal.     Comments: Well groomed, good eye contact, normal speech and thoughts     I have personally reviewed the radiology report from 06/17/22 on CXR.  CLINICAL DATA: Shortness of breath and diarrhea.  EXAM: CHEST - 2 VIEW  COMPARISON: Chest x-ray dated Aug 26, 2021.  FINDINGS: The  heart size and mediastinal contours are within normal limits. The lungs remain hyperinflated with mild interstitial coarsening of the lung bases. No focal consolidation, pleural effusion, or pneumothorax. No acute osseous abnormality. Prior bilateral shoulder arthroplasties.  IMPRESSION: 1. No acute cardiopulmonary disease. 2. COPD.   Electronically Signed By: Titus Dubin M.D. On: 06/17/2022 11:42   Results for orders placed or performed during the hospital encounter of 06/17/22  CBC  Result Value Ref  Range   WBC 8.1 4.0 - 10.5 K/uL   RBC 4.47 3.87 - 5.11 MIL/uL   Hemoglobin 12.5 12.0 - 15.0 g/dL   HCT 40.3 36.0 - 46.0 %   MCV 90.2 80.0 - 100.0 fL   MCH 28.0 26.0 - 34.0 pg   MCHC 31.0 30.0 - 36.0 g/dL   RDW 13.9 11.5 - 15.5 %   Platelets 288 150 - 400 K/uL   nRBC 0.0 0.0 - 0.2 %  Comprehensive metabolic panel  Result Value Ref Range   Sodium 139 135 - 145 mmol/L   Potassium 2.9 (L) 3.5 - 5.1 mmol/L   Chloride 101 98 - 111 mmol/L   CO2 27 22 - 32 mmol/L   Glucose, Bld 117 (H) 70 - 99 mg/dL   BUN 28 (H) 8 - 23 mg/dL   Creatinine, Ser 1.23 (H) 0.44 - 1.00 mg/dL   Calcium 9.2 8.9 - 10.3 mg/dL   Total Protein 7.5 6.5 - 8.1 g/dL   Albumin 3.8 3.5 - 5.0 g/dL   AST 16 15 - 41 U/L   ALT 13 0 - 44 U/L   Alkaline Phosphatase 74 38 - 126 U/L   Total Bilirubin 0.8 0.3 - 1.2 mg/dL   GFR, Estimated 43 (L) >60 mL/min   Anion gap 11 5 - 15  Lipase, blood  Result Value Ref Range   Lipase 23 11 - 51 U/L      Assessment & Plan:   Problem List Items Addressed This Visit     Hypokalemia   Relevant Medications   potassium chloride (MICRO-K) 10 MEQ CR capsule   Other Visit Diagnoses     Diarrhea, unspecified type    -  Primary   Mild dehydration           Likely viral gastroenteritis, mild to moderate dehydration Afebrile Some history of recent sick contacts Currently no respiratory symptoms Hypokalemia on last lab in hospital 1 week ago 2.9 She was out of K  supplement, not ordered enough on discharge to home Poor BY MOUTH but has improved  Reassurance now less diarrhea. No other infectious symptoms Benign abdomen Tachycardia mild, with dehydration  Defer repeat lab today since not taking potassium  Re order Potassium chloride capsule - instructions TWICE a day for 7 days then go back to daily dose.  Increase BY MOUTH intake, rehydration solution discussion  Consider repeat lab if indicated  Follow-up if not improving or worsening.  Meds ordered this encounter  Medications   potassium chloride (MICRO-K) 10 MEQ CR capsule    Sig: Take 1 capsule (10 mEq total) by mouth daily. May open capsule sprinkle on soft food or swallow whole.    Dispense:  90 capsule    Refill:  3    Follow up plan: Return if symptoms worsen or fail to improve.   Nobie Putnam, [facility] 06/22/2022, 1:26 PM

## 2022-06-22 NOTE — Patient Instructions (Addendum)
Thank you for coming to the office today.  Likely stomach bug gastroenteritis, may resolve 7-14 days  Potassium was low 2.9  Now recommend DOUBLE dose, so take one capsule potassium TWICE per day, for 7 days.  Then resume regular dose One capsule DAILY.  OTC IBGard Peppermint Oil (Triple Coated Capsule) '180mg'$  take one 3 times daily to reduce diarrhea  Caution with hard candy, artificial sweeteners / creamers - avoid these to provoke.  Rehydration with gatorade normal or G2 low calorie  Can do apple juice : water ratio 50/50   Please schedule a Follow-up Appointment to: Return if symptoms worsen or fail to improve.  If you have any other questions or concerns, please feel free to call the office or send a message through [facility]. You may also schedule an earlier appointment if necessary.  Additionally, you may be receiving a survey about your experience at our office within a few days to 1 week by e-mail or mail. We value your feedback.  Nobie Putnam, DO [place]

## 2022-06-23 ENCOUNTER — Ambulatory Visit: Payer: Self-pay | Admitting: *Deleted

## 2022-06-23 NOTE — Patient Outreach (Signed)
  Care Coordination   Follow Up Visit Note   06/23/2022 Name: Candace Tapia MRN: [id number] DOB: [birth year]  Candace Tapia is a 87 y.o. year old female who sees Olin Hauser, DO for primary care. I spoke with  Candace Tapia by phone today.  What matters to the patients health and wellness today?  Was seen in the ED on 3/1 for diarrhea, followed up with PCP on 3/6, state it has resolved now, thought to be of viral origin.  Report COPD is currently managed.  Denies any urgent concerns, encouraged to contact this care manager with questions.     Goals Addressed             This Visit's Progress    COMPLETED: RNCM: Effective managment of COPD   On track    Care Coordination Interventions: Provided patient with basic written and verbal COPD education on self care/management/and exacerbation prevention Advised patient to track and manage COPD triggers Provided instruction about proper use of medications used for management of COPD including inhalers Advised patient to self assesses COPD action plan zone and make appointment with provider if in the yellow zone for 48 hours without improvement Advised patient to engage in light exercise as tolerated 3-5 days a week to aid in the the management of COPD Provided education about and advised patient to utilize infection prevention strategies to reduce risk of respiratory infection Discussed the importance of adequate rest and management of fatigue with COPD           SDOH assessments and interventions completed:  No     Care Coordination Interventions:  Yes, provided   Follow up plan: No further intervention required.   Encounter Outcome:  Pt. Visit Completed   Valente David, RN, MSN, [place] [facility] Care Management Coordinator [phone]

## 2022-06-29 ENCOUNTER — Telehealth: Payer: Self-pay | Admitting: Family Medicine

## 2022-06-29 NOTE — Telephone Encounter (Signed)
Contacted Candace Tapia to schedule their annual wellness visit. Appointment made for 07/29/2022.  Sherol Dade; Care Guide Ambulatory Clinical [facility] Direct Dial: [phone]

## 2022-07-22 DIAGNOSIS — J449 Chronic obstructive pulmonary disease, unspecified: Secondary | ICD-10-CM | POA: Diagnosis not present

## 2022-07-29 ENCOUNTER — Ambulatory Visit (INDEPENDENT_AMBULATORY_CARE_PROVIDER_SITE_OTHER): Payer: Medicare Other

## 2022-07-29 VITALS — Ht 64.0 in | Wt 204.0 lb

## 2022-07-29 DIAGNOSIS — Z0000 Encounter for general adult medical examination without abnormal findings: Secondary | ICD-10-CM | POA: Diagnosis not present

## 2022-07-29 NOTE — Progress Notes (Signed)
I connected with  Vickey Sages on 07/29/22 by a audio enabled telemedicine application and verified that I am speaking with the correct person using two identifiers.  Patient Location: Home  Provider Location: Office/Clinic  I discussed the limitations of evaluation and management by telemedicine. The patient expressed understanding and agreed to proceed.  Subjective:   Candace Tapia is a 87 y.o. female who presents for Medicare Annual (Subsequent) preventive examination.  Review of Systems     Cardiac Risk Factors include: advanced age (>69men, >44 women);dyslipidemia;hypertension;obesity (BMI >30kg/m2);sedentary lifestyle     Objective:    There were no vitals filed for this visit. There is no height or weight on file to calculate BMI.     07/29/2022    1:35 PM 06/17/2022   11:14 AM 08/26/2021   10:36 AM 07/21/2020    3:24 PM 08/18/2019    9:18 AM 01/01/2019    3:28 PM 10/03/2018   10:13 AM  Advanced Directives  Does Patient Have a Medical Advance Directive? No No No Yes Yes Yes Yes  Type of Theme park manager;Living will Healthcare Power of [place];Living will Living will;Healthcare Power of [employer] Power of Attorney  Copy of Healthcare Power of Attorney in Chart?    No - copy requested  No - copy requested   Would patient like information on creating a medical advance directive? No - Patient declined No - Patient declined         Current Medications (verified) Outpatient Encounter Medications as of 07/29/2022  Medication Sig   acetaminophen (TYLENOL) 650 MG CR tablet Take 650 mg by mouth every 8 (eight) hours as needed for pain.   albuterol (VENTOLIN HFA) 108 (90 Base) MCG/ACT inhaler Inhale 1 puff into the lungs every 6 (six) hours as needed.    amLODipine (NORVASC) 5 MG tablet Take 1 tablet (5 mg total) by mouth daily.   aspirin 81 MG chewable tablet Chew 81 mg by mouth daily as needed.   chlorthalidone (HYGROTON) 25 MG  tablet Take 1 tablet (25 mg total) by mouth daily.   Cholecalciferol 25 MCG (1000 UT) capsule Vitamin D3 25 mcg (1,000 unit) capsule  1 po qd   dicyclomine (BENTYL) 10 MG capsule Take 1 capsule (10 mg total) by mouth 4 (four) times daily -  before meals and at bedtime. Take as needed for abdominal bloating / cramping symptoms   famotidine (PEPCID) 20 MG tablet Take 1 tablet (20 mg total) by mouth 2 (two) times daily.   fluticasone (FLONASE) 50 MCG/ACT nasal spray Place 1 spray into both nostrils as needed.    gabapentin (NEURONTIN) 400 MG capsule TAKE 1 CAPSULE(400 MG) BY MOUTH TWICE DAILY   losartan (COZAAR) 100 MG tablet Take 1 tablet (100 mg total) by mouth daily.   meclizine (ANTIVERT) 25 MG tablet Take 0.5 tablets (12.5 mg total) by mouth every 8 (eight) hours as needed for dizziness.   meloxicam (MOBIC) 7.5 MG tablet Take 7.5 mg by mouth 3 (three) times daily.   Multiple Vitamins-Minerals (MULTIVITAMIN WOMEN 50+) TABS Take 1 tablet by mouth daily.    NEXIUM 20 MG capsule Take 1 capsule (20 mg total) by mouth daily.   ondansetron (ZOFRAN-ODT) 4 MG disintegrating tablet Take 1 tablet (4 mg total) by mouth every 8 (eight) hours as needed for nausea or vomiting.   potassium chloride (MICRO-K) 10 MEQ CR capsule Take 1 capsule (10 mEq total) by mouth daily. May open capsule  sprinkle on soft food or swallow whole.   Probiotic Product (PROBIOTIC DAILY PO) Take by mouth.   Spacer/Aero-Holding Chambers (AEROCHAMBER PLUS) inhaler Use as instructed   [DISCONTINUED] fexofenadine (ALLEGRA ALLERGY) 180 MG tablet Take 180 mg by mouth as needed. Reported on 09/29/2015   No facility-administered encounter medications on file as of 07/29/2022.    Allergies (verified) Tramadol hcl, Statins, Penicillin g, Penicillins, and Sulfa antibiotics   History: Past Medical History:  Diagnosis Date   Allergy    Arthritis    Breast cancer 2014   radiation and taking exemestane   Cancer of left breast 10/27/2014    GERD (gastroesophageal reflux disease)    H/O: hysterectomy    History of lumpectomy 2014   LT breast   Past Surgical History:  Procedure Laterality Date   ABDOMINAL HYSTERECTOMY     BREAST LUMPECTOMY Left 2014   BREAST SURGERY Left 2014   JOINT REPLACEMENT     LYMPH NODE DISSECTION     neck   TOTAL SHOULDER REPLACEMENT Bilateral 2010 and 2013   Family History  Problem Relation Age of Onset   Breast cancer Mother 47   Diabetes Mother    Kidney disease Father    Diabetes Father    Breast cancer Sister 59   Social History   Socioeconomic History   Marital status: Widowed    Spouse name: Not on file   Number of children: 2   Years of education: Not on file   Highest education level: 8th grade  Occupational History   Occupation: Retired  Tobacco Use   Smoking status: Former    Packs/day: 2.00    Years: 40.00    Additional pack years: 0.00    Total pack years: 80.00    Types: Cigarettes    Quit date: 04/19/1993    Years since quitting: 29.2   Smokeless tobacco: Never  Vaping Use   Vaping Use: Not on file  Substance and Sexual Activity   Alcohol use: No    Alcohol/week: 0.0 standard drinks of alcohol   Drug use: No   Sexual activity: Not Currently  Other Topics Concern   Not on file  Social History Narrative   Not on file   Social Determinants of Health   Financial Resource Strain: Low Risk  (07/29/2022)   Overall Financial Resource Strain (CARDIA)    Difficulty of Paying Living Expenses: Not hard at all  Food Insecurity: No Food Insecurity (07/29/2022)   Hunger Vital Sign    Worried About Running Out of Food in the Last Year: Never true    Ran Out of Food in the Last Year: Never true  Transportation Needs: No Transportation Needs (07/29/2022)   PRAPARE - Administrator, Civil Service (Medical): No    Lack of Transportation (Non-Medical): No  Physical Activity: Inactive (07/29/2022)   Exercise Vital Sign    Days of Exercise per Week: 0 days     Minutes of Exercise per Session: 0 min  Stress: No Stress Concern Present (07/29/2022)   [employer] of Occupational Health - Occupational Stress Questionnaire    Feeling of Stress : Not at all  Social Connections: Socially Isolated (07/29/2022)   Social Connection and Isolation Panel [NHANES]    Frequency of Communication with Friends and Family: Once a week    Frequency of Social Gatherings with Friends and Family: Once a week    Attends Religious Services: Never    Database administrator or Organizations: No  Attends Banker Meetings: Never    Marital Status: Widowed    Tobacco Counseling Counseling given: Not Answered   Clinical Intake:  Pre-visit preparation completed: Yes        Nutritional Risks: None Diabetes: No  How often do you need to have someone help you when you read instructions, pamphlets, or other written materials from your doctor or pharmacy?: 1 - Never  Diabetic?no  Interpreter Needed?: No  Information entered by :: Kennedy Bucker, LPN   Activities of Daily Living    07/29/2022    1:36 PM  In your present state of health, do you have any difficulty performing the following activities:  Hearing? 1  Vision? 0  Difficulty concentrating or making decisions? 0  Walking or climbing stairs? 1  Dressing or bathing? 0  Doing errands, shopping? 0  Preparing Food and eating ? N  Using the Toilet? N  In the past six months, have you accidently leaked urine? N  Do you have problems with loss of bowel control? N  Managing your Medications? N  Managing your Finances? N  Housekeeping or managing your Housekeeping? N    Patient Care Team: Smitty Cords, DO as PCP - General (Family Medicine) Mertie Moores, MD as Referring Physician (Specialist) Rosey Bath, MD (Inactive) as Referring Physician (Hematology and Oncology) Marlowe Sax, RN as Case Manager (General Practice) Bridgett Larsson, LCSW as Social Worker  (Licensed Clinical Social Worker) Kemper Durie, RN as [facility] Therapist, music  Indicate any recent Medical Services you may have received from other than [facility] providers in the past year (date may be approximate).     Assessment:   This is a routine wellness examination for Candace Tapia.  Hearing/Vision screen Hearing Screening - Comments:: Wears aids Vision Screening - Comments:: Wears readers-   Dietary issues and exercise activities discussed: Current Exercise Habits: The patient does not participate in regular exercise at present   Goals Addressed             This Visit's Progress    DIET - EAT MORE FRUITS AND VEGETABLES         Depression Screen    07/29/2022    1:33 PM 02/23/2022    2:01 PM 08/31/2021    1:50 PM 07/27/2021    3:27 PM 02/23/2021    3:48 PM 09/21/2020    2:23 PM 07/21/2020    3:25 PM  PHQ 2/9 Scores  PHQ - 2 Score 0 6 2 2 2 4 6   PHQ- 9 Score 0 18 4 4 7 7 13     Fall Risk    07/29/2022    1:36 PM 02/23/2022    2:01 PM 08/31/2021    1:49 PM 07/27/2021    3:26 PM 09/21/2020    2:06 PM  Fall Risk   Falls in the past year? 0 0 0 0 0  Number falls in past yr: 0 0 0 0 0  Injury with Fall? 0 0 0 0 0  Risk for fall due to : No Fall Risks No Fall Risks No Fall Risks    Follow up Falls prevention discussed;Falls evaluation completed Falls evaluation completed Falls evaluation completed Falls evaluation completed Falls evaluation completed    FALL RISK PREVENTION PERTAINING TO THE HOME:  Any stairs in or around the home? No  If so, are there any without handrails? No  Home free of loose throw rugs in walkways, pet beds, electrical cords, etc? Yes  Adequate lighting in your home to reduce risk of falls? Yes   ASSISTIVE DEVICES UTILIZED TO PREVENT FALLS:  Life alert? No  Use of a cane, walker or w/c? Yes - walker Grab bars in the bathroom? Yes  Shower chair or bench in shower? Yes  Elevated toilet seat or a handicapped toilet? No   Cognitive  Function:        07/29/2022    1:41 PM 07/27/2021    3:28 PM 07/21/2020    3:29 PM 01/01/2019    3:39 PM 12/26/2017    2:59 PM  6CIT Screen  What Year? 0 points 0 points 0 points 0 points 0 points  What month? 0 points 0 points 0 points 0 points 0 points  What time? 0 points 0 points 0 points 0 points 0 points  Count back from 20 0 points 0 points 0 points 0 points 0 points  Months in reverse 2 points 2 points 0 points 0 points 0 points  Repeat phrase 4 points 4 points 8 points 0 points 2 points  Total Score 6 points 6 points 8 points 0 points 2 points    Immunizations Immunization History  Administered Date(s) Administered   Fluad Quad(high Dose 65+) 01/01/2019, 03/02/2020, 02/23/2021, 02/23/2022   Influenza, High Dose Seasonal PF 02/24/2015, 02/09/2016, 12/12/2016, 01/23/2018   Influenza-Unspecified 01/16/2014   Pneumococcal Conjugate-13 08/29/2014   Pneumococcal Polysaccharide-23 04/19/2007   Tdap 01/17/2013   Zoster, Live 08/29/2014    TDAP status: Up to date  Flu Vaccine status: Up to date  Pneumococcal vaccine status: Up to date  Covid-19 vaccine status: Declined, Education has been provided regarding the importance of this vaccine but patient still declined. Advised may receive this vaccine at local pharmacy or Health Dept.or vaccine clinic. Aware to provide a copy of the vaccination record if obtained from local pharmacy or Health Dept. Verbalized acceptance and understanding.  Qualifies for Shingles Vaccine? Yes   Zostavax completed Yes   Shingrix Completed?: No.    Education has been provided regarding the importance of this vaccine. Patient has been advised to call insurance company to determine out of pocket expense if they have not yet received this vaccine. Advised may also receive vaccine at local pharmacy or Health Dept. Verbalized acceptance and understanding.  Screening Tests Health Maintenance  Topic Date Due   COVID-19 Vaccine (1) Never done   Zoster  Vaccines- Shingrix (1 of 2) Never done   INFLUENZA VACCINE  11/17/2022   DTaP/Tdap/Td (2 - Td or Tdap) 01/18/2023   Medicare Annual Wellness (AWV)  07/29/2023   Pneumonia Vaccine 76+ Years old  Completed   DEXA SCAN  Completed   HPV VACCINES  Aged Out    Health Maintenance  Health Maintenance Due  Topic Date Due   COVID-19 Vaccine (1) Never done   Zoster Vaccines- Shingrix (1 of 2) Never done    Colorectal cancer screening: No longer required.   Mammogram status: No longer required due to age.  Lung Cancer Screening: (Low Dose CT Chest recommended if Age 100-80 years, 30 pack-year currently smoking OR have quit w/in 15years.) does not qualify.    Additional Screening:  Hepatitis C Screening: does not qualify; Completed no  Vision Screening: Recommended annual ophthalmology exams for early detection of glaucoma and other disorders of the eye. Is the patient up to date with their annual eye exam?  No  Who is the provider or what is the name of the office in which the patient attends  annual eye exams? No one If pt is not established with a provider, would they like to be referred to a provider to establish care? No .   Dental Screening: Recommended annual dental exams for proper oral hygiene  Community Resource Referral / Chronic Care Management: CRR required this visit?  No   CCM required this visit?  No      Plan:     I have personally reviewed and noted the following in the patient's chart:   Medical and social history Use of alcohol, tobacco or illicit drugs  Current medications and supplements including opioid prescriptions. Patient is not currently taking opioid prescriptions. Functional ability and status Nutritional status Physical activity Advanced directives List of other physicians Hospitalizations, surgeries, and ER visits in previous 12 months Vitals Screenings to include cognitive, depression, and falls Referrals and appointments  In addition, I  have reviewed and discussed with patient certain preventive protocols, quality metrics, and best practice recommendations. A written personalized care plan for preventive services as well as general preventive health recommendations were provided to patient.     Hal Hope, LPN   [id number]   Nurse Notes: none

## 2022-07-29 NOTE — Patient Instructions (Signed)
Candace Tapia , Thank you for taking time to come for your Medicare Wellness Visit. I appreciate your ongoing commitment to your health goals. Please review the following plan we discussed and let me know if I can assist you in the future.   These are the goals we discussed:  Goals       "I really extra support at times because I'm alone" (pt-stated)      Patient Self Care Activities:  Self administers medications as prescribed Attend all scheduled provider appointments Contact clinic with any questions or concerns Continue utilizing healthy coping skills      DIET - EAT MORE FRUITS AND VEGETABLES      Increase water intake      Recommend drinking at least 6-8 glasses of water a day      Patient Stated      07/21/2020, drink more water      SW-Track and Manage My Symptoms-Depression      Timeframe:  Long-Range Goal Priority:  Medium Start Date 3/23/222                        Expected End Date: 06/15/21         Follow Up Date- 06/08/21   Patient Self Care Activities:  Self administers medications as prescribed Attend all scheduled provider appointments Contact clinic with any questions or concerns Continue utilizing healthy coping skills      Weight (lb) < 200 lb (90.7 kg)        This is a list of the screening recommended for you and due dates:  Health Maintenance  Topic Date Due   COVID-19 Vaccine (1) Never done   Zoster (Shingles) Vaccine (1 of 2) Never done   Flu Shot  11/17/2022   DTaP/Tdap/Td vaccine (2 - Td or Tdap) 01/18/2023   Medicare Annual Wellness Visit  07/29/2023   Pneumonia Vaccine  Completed   DEXA scan (bone density measurement)  Completed   HPV Vaccine  Aged Out    Advanced directives: no  Conditions/risks identified: none  Next appointment: Follow up in one year for your annual wellness visit 08/04/23 @ 1:30 pm by phone   Preventive Care 65 Years and Older, Female Preventive care refers to lifestyle choices and visits with your health care  provider that can promote health and wellness. What does preventive care include? A yearly physical exam. This is also called an annual well check. Dental exams once or twice a year. Routine eye exams. Ask your health care provider how often you should have your eyes checked. Personal lifestyle choices, including: Daily care of your teeth and gums. Regular physical activity. Eating a healthy diet. Avoiding tobacco and drug use. Limiting alcohol use. Practicing safe sex. Taking low-dose aspirin every day. Taking vitamin and mineral supplements as recommended by your health care provider. What happens during an annual well check? The services and screenings done by your health care provider during your annual well check will depend on your age, overall health, lifestyle risk factors, and family history of disease. Counseling  Your health care provider may ask you questions about your: Alcohol use. Tobacco use. Drug use. Emotional well-being. Home and relationship well-being. Sexual activity. Eating habits. History of falls. Memory and ability to understand (cognition). Work and work Astronomer. Reproductive health. Screening  You may have the following tests or measurements: Height, weight, and BMI. Blood pressure. Lipid and cholesterol levels. These may be checked every 5 years, or more  frequently if you are over 79 years old. Skin check. Lung cancer screening. You may have this screening every year starting at age 50 if you have a 30-pack-year history of smoking and currently smoke or have quit within the past 15 years. Fecal occult blood test (FOBT) of the stool. You may have this test every year starting at age 87. Flexible sigmoidoscopy or colonoscopy. You may have a sigmoidoscopy every 5 years or a colonoscopy every 10 years starting at age 54. Hepatitis C blood test. Hepatitis B blood test. Sexually transmitted disease (STD) testing. Diabetes screening. This is done by  checking your blood sugar (glucose) after you have not eaten for a while (fasting). You may have this done every 1-3 years. Bone density scan. This is done to screen for osteoporosis. You may have this done starting at age 50. Mammogram. This may be done every 1-2 years. Talk to your health care provider about how often you should have regular mammograms. Talk with your health care provider about your test results, treatment options, and if necessary, the need for more tests. Vaccines  Your health care provider may recommend certain vaccines, such as: Influenza vaccine. This is recommended every year. Tetanus, diphtheria, and acellular pertussis (Tdap, Td) vaccine. You may need a Td booster every 10 years. Zoster vaccine. You may need this after age 54. Pneumococcal 13-valent conjugate (PCV13) vaccine. One dose is recommended after age 71. Pneumococcal polysaccharide (PPSV23) vaccine. One dose is recommended after age 44. Talk to your health care provider about which screenings and vaccines you need and how often you need them. This information is not intended to replace advice given to you by your health care provider. Make sure you discuss any questions you have with your health care provider. Document Released: 05/01/2015 Document Revised: 12/23/2015 Document Reviewed: 02/03/2015 Elsevier Interactive Patient Education  2017 [employer].  Fall Prevention in the Home Falls can cause injuries. They can happen to people of all ages. There are many things you can do to make your home safe and to help prevent falls. What can I do on the outside of my home? Regularly fix the edges of walkways and driveways and fix any cracks. Remove anything that might make you trip as you walk through a door, such as a raised step or threshold. Trim any bushes or trees on the path to your home. Use bright outdoor lighting. Clear any walking paths of anything that might make someone trip, such as rocks or  tools. Regularly check to see if handrails are loose or broken. Make sure that both sides of any steps have handrails. Any raised decks and porches should have guardrails on the edges. Have any leaves, snow, or ice cleared regularly. Use sand or salt on walking paths during winter. Clean up any spills in your garage right away. This includes oil or grease spills. What can I do in the bathroom? Use night lights. Install grab bars by the toilet and in the tub and shower. Do not use towel bars as grab bars. Use non-skid mats or decals in the tub or shower. If you need to sit down in the shower, use a plastic, non-slip stool. Keep the floor dry. Clean up any water that spills on the floor as soon as it happens. Remove soap buildup in the tub or shower regularly. Attach bath mats securely with double-sided non-slip rug tape. Do not have throw rugs and other things on the floor that can make you trip. What can  I do in the bedroom? Use night lights. Make sure that you have a light by your bed that is easy to reach. Do not use any sheets or blankets that are too big for your bed. They should not hang down onto the floor. Have a firm chair that has side arms. You can use this for support while you get dressed. Do not have throw rugs and other things on the floor that can make you trip. What can I do in the kitchen? Clean up any spills right away. Avoid walking on wet floors. Keep items that you use a lot in easy-to-reach places. If you need to reach something above you, use a strong step stool that has a grab bar. Keep electrical cords out of the way. Do not use floor polish or wax that makes floors slippery. If you must use wax, use non-skid floor wax. Do not have throw rugs and other things on the floor that can make you trip. What can I do with my stairs? Do not leave any items on the stairs. Make sure that there are handrails on both sides of the stairs and use them. Fix handrails that are  broken or loose. Make sure that handrails are as long as the stairways. Check any carpeting to make sure that it is firmly attached to the stairs. Fix any carpet that is loose or worn. Avoid having throw rugs at the top or bottom of the stairs. If you do have throw rugs, attach them to the floor with carpet tape. Make sure that you have a light switch at the top of the stairs and the bottom of the stairs. If you do not have them, ask someone to add them for you. What else can I do to help prevent falls? Wear shoes that: Do not have high heels. Have rubber bottoms. Are comfortable and fit you well. Are closed at the toe. Do not wear sandals. If you use a stepladder: Make sure that it is fully opened. Do not climb a closed stepladder. Make sure that both sides of the stepladder are locked into place. Ask someone to hold it for you, if possible. Clearly mark and make sure that you can see: Any grab bars or handrails. First and last steps. Where the edge of each step is. Use tools that help you move around (mobility aids) if they are needed. These include: Canes. Walkers. Scooters. Crutches. Turn on the lights when you go into a dark area. Replace any light bulbs as soon as they burn out. Set up your furniture so you have a clear path. Avoid moving your furniture around. If any of your floors are uneven, fix them. If there are any pets around you, be aware of where they are. Review your medicines with your doctor. Some medicines can make you feel dizzy. This can increase your chance of falling. Ask your doctor what other things that you can do to help prevent falls. This information is not intended to replace advice given to you by your health care provider. Make sure you discuss any questions you have with your health care provider. Document Released: 01/29/2009 Document Revised: 09/10/2015 Document Reviewed: 05/09/2014 Elsevier Interactive Patient Education  2017 [employer].

## 2022-08-21 DIAGNOSIS — J449 Chronic obstructive pulmonary disease, unspecified: Secondary | ICD-10-CM | POA: Diagnosis not present

## 2022-08-24 ENCOUNTER — Ambulatory Visit (INDEPENDENT_AMBULATORY_CARE_PROVIDER_SITE_OTHER): Payer: Medicare Other | Admitting: Family Medicine

## 2022-08-24 ENCOUNTER — Other Ambulatory Visit: Payer: Self-pay | Admitting: Family Medicine

## 2022-08-24 ENCOUNTER — Encounter: Payer: Self-pay | Admitting: Family Medicine

## 2022-08-24 VITALS — BP 120/64 | HR 95 | Ht 64.0 in | Wt 206.0 lb

## 2022-08-24 DIAGNOSIS — F3342 Major depressive disorder, recurrent, in full remission: Secondary | ICD-10-CM

## 2022-08-24 DIAGNOSIS — R7303 Prediabetes: Secondary | ICD-10-CM

## 2022-08-24 DIAGNOSIS — J432 Centrilobular emphysema: Secondary | ICD-10-CM

## 2022-08-24 DIAGNOSIS — I129 Hypertensive chronic kidney disease with stage 1 through stage 4 chronic kidney disease, or unspecified chronic kidney disease: Secondary | ICD-10-CM

## 2022-08-24 DIAGNOSIS — Z0000 Encounter for general adult medical examination without abnormal findings: Secondary | ICD-10-CM

## 2022-08-24 DIAGNOSIS — M5136 Other intervertebral disc degeneration, lumbar region: Secondary | ICD-10-CM | POA: Diagnosis not present

## 2022-08-24 DIAGNOSIS — N182 Chronic kidney disease, stage 2 (mild): Secondary | ICD-10-CM

## 2022-08-24 DIAGNOSIS — E782 Mixed hyperlipidemia: Secondary | ICD-10-CM

## 2022-08-24 NOTE — Patient Instructions (Addendum)
Thank you for coming to the office today.  Keep on current medication. No changes today.  Goal to limit excess carb starch and sugar.  DUE for FASTING BLOOD WORK (no food or drink after midnight before the lab appointment, only water or coffee without cream/sugar on the morning of)  SCHEDULE "Lab Only" visit in the morning at the clinic for lab draw in 6 MONTHS   - Make sure Lab Only appointment is at about 1 week before your next appointment, so that results will be available  For Lab Results, once available within 2-3 days of blood draw, you can can log in to MyChart online to view your results and a brief explanation. Also, we can discuss results at next follow-up visit.   Please schedule a Follow-up Appointment to: Return in about 6 months (around 02/24/2023) for 6 month fasting lab only then 1 week later Annual Physical.  If you have any other questions or concerns, please feel free to call the office or send a message through MyChart. You may also schedule an earlier appointment if necessary.  Additionally, you may be receiving a survey about your experience at our office within a few days to 1 week by e-mail or mail. We value your feedback.  Saralyn Pilar, DO [facility], [place]

## 2022-08-24 NOTE — Assessment & Plan Note (Signed)
Mood in remission, chronic problem Not on medication

## 2022-08-24 NOTE — Assessment & Plan Note (Addendum)
Progressive Chronic problem, currently stable without flare She is on O2 overnight Significant exertion will cause dyspnea and weakness [facility] On Trelegy

## 2022-08-24 NOTE — Progress Notes (Signed)
Subjective:    Patient ID: Candace Tapia, female    DOB: 11-20-1935, 87 y.o.   MRN: [id number]  Candace Tapia is a 87 y.o. female presenting on 08/24/2022 for Medical Management of Chronic Issues   HPI   Abdominal Bloating / Functional GI Admits she may eat breakfast when not always hungry, she tries to avoid overeating. She states it is not linked to any one particular meal. - Not drinking any water, she drinks mostly mountain dew often. Improved on med PRN   Pre-Diabetes / obesity BMI >35 Elevated A1c 6.1 Doing well Meds: Never on meds Limited diet and exercise, admits difficulty exercising due to back hip pain arthritis Denies hypoglycemia, polyuria, visual changes, numbness or tingling.   CHRONIC HTN / Peripheral Edema / Hypokalemia Improved BP control and edema Current Meds - Losartan 100mg  daily, Chlorthalidone 25mg  daily, Amlodipine 5mg  daily   Reports good compliance, took meds today. Taking Potassium daily now   Osteoarthritis, multiple joints Chronic joint pain multiple joints. Prior history of shoulder replacement surgery in past She had been on Hydrocodone years ago and did well only short term. She takes Tylenol Ext STr 500mg  x 2 twice a day asking about taking additional dose.   Chronic Vestibular disorder Followed by [facility] ENT She has had some dizziness episodes lightheadedness On Meclizine PRN - She has had some worsening dizziness at times.   Reduced portions intake Drinking Mtn Dew and Coffee x 2 cups, drinks some water   Recurrent Depression, - in remission Reports she has prior history of some depressed mood but overall has managed to cope with it for long time and currently doing well. Cannot tolerate medication, remains off    Centrilobular Emphysema (COPD) Followed by Dr Gean [facility] Pulm On oxygen at night, nocturnal up to 2L Using inhalers Continue Trelegy She is doing well but often can get short of breath with exertion, worse  with mask Has some swelling.  Function limited by her breathing and weakness.   Health Maintenance:  Decline Shingles vaccine.     08/24/2022    2:40 PM 07/29/2022    1:33 PM 02/23/2022    2:01 PM  Depression screen PHQ 2/9  Decreased Interest 1 0 3  Down, Depressed, Hopeless 1 0 3  PHQ - 2 Score 2 0 6  Altered sleeping 0 0 3  Tired, decreased energy 1 0 3  Change in appetite 1 0 3  Feeling bad or failure about yourself  0 0 3  Trouble concentrating 0 0   Moving slowly or fidgety/restless 1 0 0  Suicidal thoughts 0 0 0  PHQ-9 Score 5 0 18  Difficult doing work/chores  Not difficult at all Extremely dIfficult    Social History   Tobacco Use   Smoking status: Former    Packs/day: 2.00    Years: 40.00    Additional pack years: 0.00    Total pack years: 80.00    Types: Cigarettes    Quit date: 04/19/1993    Years since quitting: 29.3   Smokeless tobacco: Never  Substance Use Topics   Alcohol use: No    Alcohol/week: 0.0 standard drinks of alcohol   Drug use: No    Review of Systems Per HPI unless specifically indicated above     Objective:    BP 120/64   Pulse 95   Ht 5\' 4"  (1.626 m)   Wt 206 lb (93.4 kg)   SpO2 96%   BMI  35.36 kg/m   Wt Readings from Last 3 Encounters:  08/24/22 206 lb (93.4 kg)  07/29/22 204 lb (92.5 kg)  06/22/22 204 lb (92.5 kg)    Physical Exam Vitals and nursing note reviewed.  Constitutional:      General: She is not in acute distress.    Appearance: She is well-developed. She is obese. She is not diaphoretic.     Comments: Well-appearing, comfortable, cooperative  HENT:     Head: Normocephalic and atraumatic.  Eyes:     General:        Right eye: No discharge.        Left eye: No discharge.     Conjunctiva/sclera: Conjunctivae normal.  Neck:     Thyroid: No thyromegaly.  Cardiovascular:     Rate and Rhythm: Normal rate and regular rhythm.     Heart sounds: Normal heart sounds. No murmur heard. Pulmonary:     Effort:  No respiratory distress.     Breath sounds: No wheezing or rales.     Comments: Mild wheezes scattered. Some slight increase work of breathing with long conversation. Musculoskeletal:        General: Normal range of motion.     Cervical back: Normal range of motion and neck supple.     Right lower leg: Edema present.     Left lower leg: No edema.  Lymphadenopathy:     Cervical: No cervical adenopathy.  Skin:    General: Skin is warm and dry.     Findings: No erythema or rash.  Neurological:     Mental Status: She is alert and oriented to person, place, and time.  Psychiatric:        Behavior: Behavior normal.     Comments: Well groomed, good eye contact, normal speech and thoughts      Results for orders placed or performed during the hospital encounter of 06/17/22  CBC  Result Value Ref Range   WBC 8.1 4.0 - 10.5 K/uL   RBC 4.47 3.87 - 5.11 MIL/uL   Hemoglobin 12.5 12.0 - 15.0 g/dL   HCT 16.1 09.6 - 04.5 %   MCV 90.2 80.0 - 100.0 fL   MCH 28.0 26.0 - 34.0 pg   MCHC 31.0 30.0 - 36.0 g/dL   RDW 40.9 81.1 - 91.4 %   Platelets 288 150 - 400 K/uL   nRBC 0.0 0.0 - 0.2 %  Comprehensive metabolic panel  Result Value Ref Range   Sodium 139 135 - 145 mmol/L   Potassium 2.9 (L) 3.5 - 5.1 mmol/L   Chloride 101 98 - 111 mmol/L   CO2 27 22 - 32 mmol/L   Glucose, Bld 117 (H) 70 - 99 mg/dL   BUN 28 (H) 8 - 23 mg/dL   Creatinine, Ser 7.82 (H) 0.44 - 1.00 mg/dL   Calcium 9.2 8.9 - 95.6 mg/dL   Total Protein 7.5 6.5 - 8.1 g/dL   Albumin 3.8 3.5 - 5.0 g/dL   AST 16 15 - 41 U/L   ALT 13 0 - 44 U/L   Alkaline Phosphatase 74 38 - 126 U/L   Total Bilirubin 0.8 0.3 - 1.2 mg/dL   GFR, Estimated 43 (L) >60 mL/min   Anion gap 11 5 - 15  Lipase, blood  Result Value Ref Range   Lipase 23 11 - 51 U/L      Assessment & Plan:   Problem List Items Addressed This Visit     Benign hypertension with CKD (  chronic kidney disease), stage II - Primary    Controlled HTN  History of dizziness  is chronic, seems unrelated to medication side effect or HTN - Home BP readings improved Complication with CKD-II  Plan:  Continue current BP regimen Losartan 100mg  daily, Amlodipine 5mg , Chlorthalidone 25mg  daily Encourage improved lifestyle - low sodium diet, regular exercise Continue monitor BP outside office, bring readings to next visit, if persistently >140/90 or new symptoms notify office sooner      Centrilobular emphysema (HCC)    Progressive Chronic problem, currently stable without flare She is on O2 overnight Significant exertion will cause dyspnea and weakness Followed by [facility] Pulmonology On Trelegy      DDD (degenerative disc disease), lumbar   Major depression in full remission (HCC)    Mood in remission, chronic problem Not on medication      Pre-diabetes    Last A1c 6.1, elevated attributed to diet Concern with obesity, HTN, HLD Concern with neuropathy symptoms  Plan:  1. Not on any therapy currently  2. Encourage improved lifestyle - low carb, low sugar diet, reduce portion size, continue improving regular exercise         No orders of the defined types were placed in this encounter.    Follow up plan: Return in about 6 months (around 02/24/2023) for 6 month fasting lab only then 1 week later Annual Physical.  Future labs ordered for 01/2023   Saralyn Pilar, DO [facility] 08/24/2022, 2:46 PM

## 2022-08-24 NOTE — Assessment & Plan Note (Signed)
Controlled HTN  History of dizziness is chronic, seems unrelated to medication side effect or HTN - Home BP readings improved Complication with CKD-II  Plan:  Continue current BP regimen Losartan 100mg  daily, Amlodipine 5mg , Chlorthalidone 25mg  daily Encourage improved lifestyle - low sodium diet, regular exercise Continue monitor BP outside office, bring readings to next visit, if persistently >140/90 or new symptoms notify office sooner

## 2022-08-24 NOTE — Assessment & Plan Note (Signed)
Last A1c 6.1, elevated attributed to diet Concern with obesity, HTN, HLD Concern with neuropathy symptoms  Plan:  1. Not on any therapy currently  2. Encourage improved lifestyle - low carb, low sugar diet, reduce portion size, continue improving regular exercise

## 2022-09-15 DIAGNOSIS — M47896 Other spondylosis, lumbar region: Secondary | ICD-10-CM | POA: Diagnosis not present

## 2022-09-21 DIAGNOSIS — J449 Chronic obstructive pulmonary disease, unspecified: Secondary | ICD-10-CM | POA: Diagnosis not present

## 2022-10-21 DIAGNOSIS — J449 Chronic obstructive pulmonary disease, unspecified: Secondary | ICD-10-CM | POA: Diagnosis not present

## 2022-10-27 ENCOUNTER — Telehealth: Payer: Self-pay | Admitting: Family Medicine

## 2022-10-27 NOTE — Telephone Encounter (Signed)
Please contact patient.  Received fax from [facility] regarding recall on Potassium supplement capsules.  Some capsules were not dissolving properly and could raise potassium too high.  The recommendation is for patients to check with their pharmacy and bring the bottle to determine if their bottle was affected by the recall and they may be able to replace the medication.  Saralyn Pilar, DO [facility] 10/27/2022, 12:47 PM

## 2022-10-27 NOTE — Telephone Encounter (Signed)
The pt was notified about the Potassium recall and recommendation. She verbalize understanding and informed me that she received the letter in the mail as well.

## 2022-11-04 ENCOUNTER — Other Ambulatory Visit: Payer: Self-pay | Admitting: Family Medicine

## 2022-11-04 DIAGNOSIS — G629 Polyneuropathy, unspecified: Secondary | ICD-10-CM

## 2022-11-04 NOTE — Telephone Encounter (Signed)
Requested Prescriptions  Pending Prescriptions Disp Refills   gabapentin (NEURONTIN) 400 MG capsule [Pharmacy Med Name: GABAPENTIN 400MG  CAPSULES] 180 capsule 0    Sig: TAKE 1 CAPSULE(400 MG) BY MOUTH TWICE DAILY     Neurology: Anticonvulsants - gabapentin Failed - 11/04/2022  7:03 AM      Failed - Cr in normal range and within 360 days    Creat  Date Value Ref Range Status  02/16/2022 0.85 0.60 - 0.95 mg/dL Final   Creatinine, Ser  Date Value Ref Range Status  06/17/2022 1.23 (H) 0.44 - 1.00 mg/dL Final         Passed - Completed PHQ-2 or PHQ-9 in the last 360 days      Passed - Valid encounter within last 12 months    Recent Outpatient Visits           2 months ago Benign hypertension with CKD (chronic kidney disease), stage II   [place] [facility], Netta Neat, DO   4 months ago Diarrhea, unspecified type   [place] [facility] [place], Netta Neat, DO   8 months ago Annual physical exam   [place] [facility] Smitty Cords, DO   1 year ago Postprandial abdominal bloating   [place] [facility] Smitty Cords, DO   1 year ago Centrilobular emphysema [facility])   [place] [facility] Smitty Cords, DO       Future Appointments             In 4 months Althea Charon, Netta Neat, DO [place] [facility], [facility]

## 2022-11-15 DIAGNOSIS — J449 Chronic obstructive pulmonary disease, unspecified: Secondary | ICD-10-CM | POA: Diagnosis not present

## 2022-11-15 DIAGNOSIS — R0609 Other forms of dyspnea: Secondary | ICD-10-CM | POA: Diagnosis not present

## 2022-11-21 DIAGNOSIS — J449 Chronic obstructive pulmonary disease, unspecified: Secondary | ICD-10-CM | POA: Diagnosis not present

## 2022-12-01 ENCOUNTER — Other Ambulatory Visit: Payer: Medicare Other

## 2022-12-07 DIAGNOSIS — M47896 Other spondylosis, lumbar region: Secondary | ICD-10-CM | POA: Diagnosis not present

## 2023-01-10 ENCOUNTER — Other Ambulatory Visit: Payer: Self-pay | Admitting: Family Medicine

## 2023-01-10 DIAGNOSIS — I129 Hypertensive chronic kidney disease with stage 1 through stage 4 chronic kidney disease, or unspecified chronic kidney disease: Secondary | ICD-10-CM

## 2023-01-30 ENCOUNTER — Other Ambulatory Visit: Payer: Self-pay

## 2023-01-30 DIAGNOSIS — Z0000 Encounter for general adult medical examination without abnormal findings: Secondary | ICD-10-CM

## 2023-01-30 DIAGNOSIS — I129 Hypertensive chronic kidney disease with stage 1 through stage 4 chronic kidney disease, or unspecified chronic kidney disease: Secondary | ICD-10-CM

## 2023-01-30 DIAGNOSIS — R7303 Prediabetes: Secondary | ICD-10-CM

## 2023-01-30 DIAGNOSIS — J432 Centrilobular emphysema: Secondary | ICD-10-CM

## 2023-01-30 DIAGNOSIS — E782 Mixed hyperlipidemia: Secondary | ICD-10-CM

## 2023-01-31 ENCOUNTER — Other Ambulatory Visit: Payer: Medicare Other

## 2023-02-05 ENCOUNTER — Other Ambulatory Visit: Payer: Self-pay | Admitting: Family Medicine

## 2023-02-05 DIAGNOSIS — G629 Polyneuropathy, unspecified: Secondary | ICD-10-CM

## 2023-02-05 DIAGNOSIS — E876 Hypokalemia: Secondary | ICD-10-CM

## 2023-02-06 ENCOUNTER — Other Ambulatory Visit: Payer: Self-pay | Admitting: Family Medicine

## 2023-02-06 DIAGNOSIS — G629 Polyneuropathy, unspecified: Secondary | ICD-10-CM

## 2023-02-06 NOTE — Telephone Encounter (Signed)
Requested Prescriptions  Pending Prescriptions Disp Refills   potassium chloride (MICRO-K) 10 MEQ CR capsule [Pharmacy Med Name: POTASSIUM CL ER CAPSULES] 90 capsule 3    Sig: TAKE 1 CAPSULE(10 MEQ) BY MOUTH DAILY. MAY OPEN CAPSULE SPRINKLE ON SOFT FOOD OR. SWALLOW WHOLE     Endocrinology:  Minerals - Potassium Supplementation Failed - 02/05/2023  7:04 AM      Failed - K in normal range and within 360 days    Potassium  Date Value Ref Range Status  06/17/2022 2.9 (L) 3.5 - 5.1 mmol/L Final  04/28/2014 4.1 3.5 - 5.1 mmol/L Final         Failed - Cr in normal range and within 360 days    Creat  Date Value Ref Range Status  02/16/2022 0.85 0.60 - 0.95 mg/dL Final   Creatinine, Ser  Date Value Ref Range Status  06/17/2022 1.23 (H) 0.44 - 1.00 mg/dL Final         Passed - Valid encounter within last 12 months    Recent Outpatient Visits           5 months ago Benign hypertension with CKD (chronic kidney disease), stage II   [place] [facility] [place], Netta Neat, DO   7 months ago Diarrhea, unspecified type   [place] [facility] Smitty Cords, DO   11 months ago Annual physical exam   [place] [facility], Netta Neat, DO   1 year ago Postprandial abdominal bloating   [place] [facility] Smitty Cords, DO   1 year ago Centrilobular emphysema [facility])   [place] [facility] [employer], Netta Neat, DO       Future Appointments             In 4 weeks Althea Charon, Netta Neat, DO [place] [facility], [facility]             gabapentin (NEURONTIN) 400 MG capsule [Pharmacy Med Name: GABAPENTIN 400MG  CAPSULES] 180 capsule 0    Sig: TAKE 1 CAPSULE(400 MG) BY MOUTH TWICE DAILY     Neurology: Anticonvulsants - gabapentin Failed - 02/05/2023  7:04 AM      Failed - Cr in normal range and within 360 days    Creat  Date Value  Ref Range Status  02/16/2022 0.85 0.60 - 0.95 mg/dL Final   Creatinine, Ser  Date Value Ref Range Status  06/17/2022 1.23 (H) 0.44 - 1.00 mg/dL Final         Passed - Completed PHQ-2 or PHQ-9 in the last 360 days      Passed - Valid encounter within last 12 months    Recent Outpatient Visits           5 months ago Benign hypertension with CKD (chronic kidney disease), stage II   [place] [facility] [place], Netta Neat, DO   7 months ago Diarrhea, unspecified type   [place] [facility] Smitty Cords, DO   11 months ago Annual physical exam   [place] [facility] Smitty Cords, DO   1 year ago Postprandial abdominal bloating   [place] [facility] Smitty Cords, DO   1 year ago Centrilobular emphysema [facility])   [place] [facility] Smitty Cords, DO       Future Appointments  In 4 weeks Althea Charon, Netta Neat, DO  [facility], [facility]

## 2023-02-07 NOTE — Telephone Encounter (Signed)
Requested Prescriptions  Refused Prescriptions Disp Refills   gabapentin (NEURONTIN) 400 MG capsule [Pharmacy Med Name: GABAPENTIN 400MG  CAPSULES] 180 capsule 0    Sig: TAKE 1 CAPSULE(400 MG) BY MOUTH TWICE DAILY     Neurology: Anticonvulsants - gabapentin Failed - 02/06/2023 11:06 AM      Failed - Cr in normal range and within 360 days    Creat  Date Value Ref Range Status  02/16/2022 0.85 0.60 - 0.95 mg/dL Final   Creatinine, Ser  Date Value Ref Range Status  06/17/2022 1.23 (H) 0.44 - 1.00 mg/dL Final         Passed - Completed PHQ-2 or PHQ-9 in the last 360 days      Passed - Valid encounter within last 12 months    Recent Outpatient Visits           5 months ago Benign hypertension with CKD (chronic kidney disease), stage II   [place] [facility], Netta Neat, DO   7 months ago Diarrhea, unspecified type   [facility] Smitty Cords, DO   11 months ago Annual physical exam   [place] [facility] Smitty Cords, DO   1 year ago Postprandial abdominal bloating   [place] [facility] Smitty Cords, DO   1 year ago Centrilobular emphysema [facility])   [place] [facility] Smitty Cords, DO       Future Appointments             In 4 weeks Althea Charon, Netta Neat, DO [place] [facility], [facility]

## 2023-02-24 ENCOUNTER — Ambulatory Visit (INDEPENDENT_AMBULATORY_CARE_PROVIDER_SITE_OTHER): Payer: Medicare Other | Admitting: Family Medicine

## 2023-02-24 ENCOUNTER — Ambulatory Visit: Payer: Self-pay

## 2023-02-24 ENCOUNTER — Encounter: Payer: Self-pay | Admitting: Family Medicine

## 2023-02-24 VITALS — BP 106/64 | HR 100 | Ht 64.0 in | Wt 208.0 lb

## 2023-02-24 DIAGNOSIS — E782 Mixed hyperlipidemia: Secondary | ICD-10-CM | POA: Diagnosis not present

## 2023-02-24 DIAGNOSIS — R140 Abdominal distension (gaseous): Secondary | ICD-10-CM | POA: Diagnosis not present

## 2023-02-24 DIAGNOSIS — R110 Nausea: Secondary | ICD-10-CM

## 2023-02-24 DIAGNOSIS — R1013 Epigastric pain: Secondary | ICD-10-CM

## 2023-02-24 DIAGNOSIS — I129 Hypertensive chronic kidney disease with stage 1 through stage 4 chronic kidney disease, or unspecified chronic kidney disease: Secondary | ICD-10-CM | POA: Diagnosis not present

## 2023-02-24 DIAGNOSIS — J432 Centrilobular emphysema: Secondary | ICD-10-CM | POA: Diagnosis not present

## 2023-02-24 DIAGNOSIS — N182 Chronic kidney disease, stage 2 (mild): Secondary | ICD-10-CM | POA: Diagnosis not present

## 2023-02-24 DIAGNOSIS — R7303 Prediabetes: Secondary | ICD-10-CM | POA: Diagnosis not present

## 2023-02-24 MED ORDER — ONDANSETRON 4 MG PO TBDP: 4.0000 mg | ORAL_TABLET | Freq: Three times a day (TID) | ORAL | 2 refills | Status: DC | PRN

## 2023-02-24 MED ORDER — DICYCLOMINE HCL 10 MG PO CAPS: 10.0000 mg | ORAL_CAPSULE | Freq: Three times a day (TID) | ORAL | 2 refills | Status: DC

## 2023-02-24 NOTE — Patient Instructions (Addendum)
Thank you for coming to the office today.  Concern with the bowels and symptoms with bloating nausea. It seems this has been going on for over 1 year. We saw you for this last year and treated with medications and referral but the appointment was never scheduled.  I will submit the referral again today. They should call Mark.   Start the Dicyclomine medication for abdominal cramping and pain take before meals up to 3 times per day  Start the Zofran only as needed dissolving tablet for nausea  [place] [facility]) [address] [place], [place] [address] Phone: [phone]  [place] [facility]) [address] [place], [place] [address] Main: [phone]  LABS Today, instead of next week  Skip 11/12 apt but please arrive for 03/07/23 Tuesday apt  Please schedule a Follow-up Appointment to: Return if symptoms worsen or fail to improve.  If you have any other questions or concerns, please feel free to call the office or send a message through MyChart. You may also schedule an earlier appointment if necessary.  Additionally, you may be receiving a survey about your experience at our office within a few days to 1 week by e-mail or mail. We value your feedback.  Saralyn Pilar, DO [facility], [place]

## 2023-02-24 NOTE — Telephone Encounter (Signed)
    Chief Complaint: Abdominal pain, left upper. Bloating. Hurts after eating. Symptoms: Above Frequency: 2 days Pertinent Negatives: Patient denies fever Disposition: [] ED /[] Urgent Care (no appt availability in office) / [x] Appointment(In office/virtual)/ []  [facility]/ [] Home Care/ [] Refused Recommended Disposition /[] [place] [facility]/ []  Follow-up with PCP Additional Notes: Agrees with appointment.  Reason for Disposition  [1] MODERATE pain (e.g., interferes with normal activities) AND [2] pain comes and goes (cramps) AND [3] present > 24 hours  (Exception: Pain with Vomiting or Diarrhea - see that Guideline.)  Answer Assessment - Initial Assessment Questions 1. LOCATION: "Where does it hurt?"      Left side, upper 2. RADIATION: "Does the pain shoot anywhere else?" (e.g., chest, back)     No 3. ONSET: "When did the pain begin?" (e.g., minutes, hours or days ago)      2 Days ago 4. SUDDEN: "Gradual or sudden onset?"     Gradual 5. PATTERN "Does the pain come and go, or is it constant?"    - If it comes and goes: "How long does it last?" "Do you have pain now?"     (Note: Comes and goes means the pain is intermittent. It goes away completely between bouts.)    - If constant: "Is it getting better, staying the same, or getting worse?"      (Note: Constant means the pain never goes away completely; most serious pain is constant and gets worse.)      Comes and goes 6. SEVERITY: "How bad is the pain?"  (e.g., Scale 1-10; mild, moderate, or severe)    - MILD (1-3): Doesn't interfere with normal activities, abdomen soft and not tender to touch.     - MODERATE (4-7): Interferes with normal activities or awakens from sleep, abdomen tender to touch.     - SEVERE (8-10): Excruciating pain, doubled over, unable to do any normal activities.       Hurts when she eats 7. RECURRENT SYMPTOM: "Have you ever had this type of stomach pain before?" If Yes, ask: "When was the last  time?" and "What happened that time?"      No 8. CAUSE: "What do you think is causing the stomach pain?"     Unsure 9. RELIEVING/AGGRAVATING FACTORS: "What makes it better or worse?" (e.g., antacids, bending or twisting motion, bowel movement)     No 10. OTHER SYMPTOMS: "Do you have any other symptoms?" (e.g., back pain, diarrhea, fever, urination pain, vomiting)       Diarrhea 11. PREGNANCY: "Is there any chance you are pregnant?" "When was your last menstrual period?"       N/a  Protocols used: Abdominal Pain - [facility]

## 2023-02-24 NOTE — Progress Notes (Signed)
Subjective:    Patient ID: Candace Tapia, female    DOB: 03/14/1936, 87 y.o.   MRN: [id number]  Candace Tapia is a 87 y.o. female presenting on 02/24/2023 for Abdominal Pain (Bothering for several months, worsening over past few weeks, Stomach gets tight after eating, has nausea thinking about food), Bloated (/), and Shortness of Breath   HPI  Discussed the use of AI scribe software for clinical note transcription with the patient, who gave verbal consent to proceed.   The patient, with a known diagnosis of COPD, presents with a longstanding issue of postprandial abdominal discomfort. Onset >1.5 years+ previously referred in 2023 but never scheduled, suspect communication missed call.  The discomfort is described as a sensation of extreme tightness in the stomach, which is so severe that it often discourages the patient from eating for the rest of the day. The patient also reports feeling nauseous, particularly in the mornings, and this has been progressively worsening. However, there is no associated vomiting. The patient describes the discomfort as a feeling of something being pulled tight in the stomach, which also exacerbates her breathing difficulties due to COPD. The patient also reports significant bloating and excessive gas, even when she has not eaten anything.  The patient has tried over-the-counter remedies such as Tums and probiotics, but these have not provided significant relief. The patient also reports a non-constant pain on the left side under the stomach, which occasionally bothers her.  She was treated for her digestive symptoms in the past by myself 08/2021, on Dicyclomine she had notable improvement. But never received the contact for the GI referral that was placed unfortunately.  The patient lives alone and reports feeling progressively weaker and sicker, which she attributes to either her current condition or old age. She has assistance at home for a few hours each  day from caregivers and a niece.  The patient also reports a history of diarrhea, which she attributes to the sudden cessation of hydrocodone. She will return to her pain management at [facility] for refills.  The patient has a significant weight of 208 pounds and acknowledges overeating, particularly sweets, as a potential contributing factor to her current condition. The patient also reports dizziness, particularly when walking, which she attributes to her breathing difficulties.     She has nursing aide 2-3 days a week stay 4 hours, and she has a niece that says with her as well for 1 day  Upcoming Dr Hedy Jacob [facility] On supplemental Oxygen at night Dyspnea on exertion     02/24/2023   10:05 AM 08/24/2022    2:40 PM 07/29/2022    1:33 PM  Depression screen PHQ 2/9  Decreased Interest 1 1 0  Down, Depressed, Hopeless 1 1 0  PHQ - 2 Score 2 2 0  Altered sleeping 0 0 0  Tired, decreased energy 2 1 0  Change in appetite 1 1 0  Feeling bad or failure about yourself  0 0 0  Trouble concentrating 0 0 0  Moving slowly or fidgety/restless 1 1 0  Suicidal thoughts 1 0 0  PHQ-9 Score 7 5 0  Difficult doing work/chores Not difficult at all  Not difficult at all    Social History   Tobacco Use   Smoking status: Former    Current packs/day: 0.00    Average packs/day: 2.0 packs/day for 40.0 years (80.0 ttl pk-yrs)    Types: Cigarettes    Start date: 04/19/1953  Quit date: 04/19/1993    Years since quitting: 29.8   Smokeless tobacco: Never  Substance Use Topics   Alcohol use: No    Alcohol/week: 0.0 standard drinks of alcohol   Drug use: No    Review of Systems Per HPI unless specifically indicated above     Objective:    BP 106/64   Pulse 100   Ht 5\' 4"  (1.626 m)   Wt 208 lb (94.3 kg)   SpO2 99%   BMI 35.70 kg/m   Wt Readings from Last 3 Encounters:  02/24/23 208 lb (94.3 kg)  08/24/22 206 lb (93.4 kg)  07/29/22 204 lb (92.5 kg)    Physical Exam Vitals  and nursing note reviewed.  Constitutional:      General: She is not in acute distress.    Appearance: She is well-developed. She is obese. She is not diaphoretic.     Comments: Well-appearing, comfortable, cooperative  HENT:     Head: Normocephalic and atraumatic.  Eyes:     General:        Right eye: No discharge.        Left eye: No discharge.     Conjunctiva/sclera: Conjunctivae normal.  Neck:     Thyroid: No thyromegaly.  Cardiovascular:     Rate and Rhythm: Normal rate and regular rhythm.     Heart sounds: Normal heart sounds. No murmur heard. Pulmonary:     Effort: No respiratory distress.     Breath sounds: No wheezing or rales.     Comments: Baseline reduced air movement. Some slight increase work of breathing with long conversation. Abdominal:     General: There is distension.     Palpations: Abdomen is soft. There is no mass.     Tenderness: There is no abdominal tenderness. There is no guarding or rebound.     Hernia: No hernia is present.     Comments: Slight reduced bowel sounds.  Non tender no focal abnormal pain reproduced on exam  Musculoskeletal:        General: Normal range of motion.     Cervical back: Normal range of motion and neck supple.     Right lower leg: Edema present.     Left lower leg: No edema.     Comments: Using Cane for ambulation  Lymphadenopathy:     Cervical: No cervical adenopathy.  Skin:    General: Skin is warm and dry.     Findings: No erythema or rash.  Neurological:     Mental Status: She is alert and oriented to person, place, and time.  Psychiatric:        Behavior: Behavior normal.     Comments: Well groomed, good eye contact, normal speech and thoughts     I have personally reviewed the radiology report from 08/26/21 on CT Abdomen Pelvis.Marland Kitchen  CLINICAL DATA:  Epigastric pain and nausea.   EXAM: CT ABDOMEN AND PELVIS WITH CONTRAST   TECHNIQUE: Multidetector CT imaging of the abdomen and pelvis was performed using the  standard protocol following bolus administration of intravenous contrast.   RADIATION DOSE REDUCTION: This exam was performed according to the departmental dose-optimization program which includes automated exposure control, adjustment of the mA and/or kV according to patient size and/or use of iterative reconstruction technique.   CONTRAST:  OMNIPAQUE IOHEXOL 300 MG/ML  SOLN   COMPARISON:  MRI abdomen 02/16/2018. CT abdomen and pelvis 01/15/2018.   FINDINGS: Lower chest: Chronic bronchiectasis with mucous plugging and scarring  in the lung bases, increased from the prior study. No pleural effusion.   Hepatobiliary: Diffusely decreased attenuation of the liver compatible with steatosis. 8 mm hyperenhancing focus in the posterior right hepatic lobe corresponding to a flash filling hemangioma on the prior MRI. Small stones in the gallbladder without evidence of pericholecystic inflammation. No biliary dilatation.   Pancreas: Pancreatic atrophy. Decreased size of a cyst in the pancreatic body/neck region, now measuring 1.1 cm. No acute inflammation.   Spleen: Unremarkable.   Adrenals/Urinary Tract: Unremarkable adrenal glands. Hypodensities in the kidneys measuring up to 3.3 cm compatible with cysts; no routine follow-up imaging recommended. No renal calculi or hydronephrosis. Small cystocele, otherwise unremarkable bladder.   Stomach/Bowel: The stomach is unremarkable. There is predominantly left-sided colonic diverticulosis without evidence of acute diverticulitis. There is no evidence of bowel obstruction. The appendix is not clearly identified.   Vascular/Lymphatic: Abdominal aortic atherosclerosis without aneurysm. No enlarged lymph nodes.   Reproductive: Status post hysterectomy. 2.8 cm simple appearing left adnexal cyst for which no follow-up imaging is recommended.   Other: No ascites or pneumoperitoneum.   Musculoskeletal: No suspicious osseous lesion.  Advanced lower lumbar facet arthrosis.   IMPRESSION: 1. No acute abnormality identified in the abdomen or pelvis. 2. Hepatic steatosis. 3. Cholelithiasis. 4. Colonic diverticulosis. 5. Aortic Atherosclerosis (ICD10-I70.0).     Electronically Signed   By: Sebastian Ache M.D.   On: 08/26/2021 16:31  Results for orders placed or performed during the hospital encounter of 06/17/22  CBC  Result Value Ref Range   WBC 8.1 4.0 - 10.5 K/uL   RBC 4.47 3.87 - 5.11 MIL/uL   Hemoglobin 12.5 12.0 - 15.0 g/dL   HCT 38.7 56.4 - 33.2 %   MCV 90.2 80.0 - 100.0 fL   MCH 28.0 26.0 - 34.0 pg   MCHC 31.0 30.0 - 36.0 g/dL   RDW 95.1 88.4 - 16.6 %   Platelets 288 150 - 400 K/uL   nRBC 0.0 0.0 - 0.2 %  Comprehensive metabolic panel  Result Value Ref Range   Sodium 139 135 - 145 mmol/L   Potassium 2.9 (L) 3.5 - 5.1 mmol/L   Chloride 101 98 - 111 mmol/L   CO2 27 22 - 32 mmol/L   Glucose, Bld 117 (H) 70 - 99 mg/dL   BUN 28 (H) 8 - 23 mg/dL   Creatinine, Ser 0.63 (H) 0.44 - 1.00 mg/dL   Calcium 9.2 8.9 - 01.6 mg/dL   Total Protein 7.5 6.5 - 8.1 g/dL   Albumin 3.8 3.5 - 5.0 g/dL   AST 16 15 - 41 U/L   ALT 13 0 - 44 U/L   Alkaline Phosphatase 74 38 - 126 U/L   Total Bilirubin 0.8 0.3 - 1.2 mg/dL   GFR, Estimated 43 (L) >60 mL/min   Anion gap 11 5 - 15  Lipase, blood  Result Value Ref Range   Lipase 23 11 - 51 U/L      Assessment & Plan:   Problem List Items Addressed This Visit   None Visit Diagnoses     Epigastric abdominal pain    -  Primary   Relevant Medications   dicyclomine (BENTYL) 10 MG capsule   Other Relevant Orders   Ambulatory referral to Gastroenterology   Postprandial abdominal bloating       Relevant Medications   dicyclomine (BENTYL) 10 MG capsule   Other Relevant Orders   Ambulatory referral to Gastroenterology   Nausea  Relevant Medications   ondansetron (ZOFRAN-ODT) 4 MG disintegrating tablet   Other Relevant Orders   Ambulatory referral to  Gastroenterology      Assessment and Plan  Chronic Abdominal Discomfort Abdominal Bloating Dysfunctional GI  Ongoing abdominal discomfort, bloating, and nausea, particularly after meals. Previous relief with medication for bloating and cramping  -Order Dicyclomine medication for bloating and cramping to be taken with meals. -Order Zofran for nausea as needed.  -Reinitiate referral to GI specialist for further evaluation.  Progressive COPD / Dyspnea Reports shortness of breath, particularly with exertion. -Continue current management plan. -Follow-up with pulmonologist as scheduled.  Opioid Withdrawal diarrhea Opioid pain therapy managed by [facility] Reports recent diarrhea after abrupt cessation of hydrocodone. She has apt with Ortho to resume therapy  General Health Maintenance -Complete blood work today instead of returning on 11/12 -Schedule follow-up appointment on 03/07/2023 to review lab results and overall health status.   Additional patient needs  Case Management - I have contacted Alto Denver RN today on Epic Secure chat and reviewed her case. She has been working with her in the past. No further contact in past 6 months. She agrees to resume contact with this patient to return to providing care through [facility] and resume calls for care management.   Orders Placed This Encounter  Procedures   Ambulatory referral to Gastroenterology    Referral Priority:   Routine    Referral Type:   Consultation    Referral Reason:   Specialty Services Required    Number of Visits Requested:   1    Meds ordered this encounter  Medications   dicyclomine (BENTYL) 10 MG capsule    Sig: Take 1 capsule (10 mg total) by mouth 3 (three) times daily before meals. Take as needed for abdominal bloating / cramping symptoms    Dispense:  90 capsule    Refill:  2   ondansetron (ZOFRAN-ODT) 4 MG disintegrating tablet    Sig: Take 1 tablet (4 mg total) by mouth every 8 (eight) hours as  needed for nausea or vomiting.    Dispense:  30 tablet    Refill:  2    Follow up plan: Return if symptoms worsen or fail to improve.  Saralyn Pilar, DO [facility] 02/24/2023, 10:21 AM

## 2023-02-25 LAB — CBC WITH DIFFERENTIAL/PLATELET
Absolute Lymphocytes: 1235 {cells}/uL (ref 850–3900)
Absolute Monocytes: 725 {cells}/uL (ref 200–950)
Basophils Absolute: 39 {cells}/uL (ref 0–200)
Basophils Relative: 0.4 %
Eosinophils Absolute: 49 {cells}/uL (ref 15–500)
Eosinophils Relative: 0.5 %
HCT: 40.8 % (ref 35.0–45.0)
Hemoglobin: 12.9 g/dL (ref 11.7–15.5)
MCH: 27.3 pg (ref 27.0–33.0)
MCHC: 31.6 g/dL — ABNORMAL LOW (ref 32.0–36.0)
MCV: 86.3 fL (ref 80.0–100.0)
MPV: 11.2 fL (ref 7.5–12.5)
Monocytes Relative: 7.4 %
Neutro Abs: 7752 {cells}/uL (ref 1500–7800)
Neutrophils Relative %: 79.1 %
Platelets: 298 10*3/uL (ref 140–400)
RBC: 4.73 10*6/uL (ref 3.80–5.10)
RDW: 14.7 % (ref 11.0–15.0)
Total Lymphocyte: 12.6 %
WBC: 9.8 10*3/uL (ref 3.8–10.8)

## 2023-02-25 LAB — COMPLETE METABOLIC PANEL WITH GFR
AG Ratio: 1.7 (calc) (ref 1.0–2.5)
ALT: 11 U/L (ref 6–29)
AST: 14 U/L (ref 10–35)
Albumin: 4.4 g/dL (ref 3.6–5.1)
Alkaline phosphatase (APISO): 73 U/L (ref 37–153)
BUN/Creatinine Ratio: 16 (calc) (ref 6–22)
BUN: 16 mg/dL (ref 7–25)
CO2: 29 mmol/L (ref 20–32)
Calcium: 10.2 mg/dL (ref 8.6–10.4)
Chloride: 100 mmol/L (ref 98–110)
Creat: 0.99 mg/dL — ABNORMAL HIGH (ref 0.60–0.95)
Globulin: 2.6 g/dL (ref 1.9–3.7)
Glucose, Bld: 95 mg/dL (ref 65–99)
Potassium: 3.8 mmol/L (ref 3.5–5.3)
Sodium: 141 mmol/L (ref 135–146)
Total Bilirubin: 0.4 mg/dL (ref 0.2–1.2)
Total Protein: 7 g/dL (ref 6.1–8.1)
eGFR: 56 mL/min/{1.73_m2} — ABNORMAL LOW (ref >=60)

## 2023-02-25 LAB — LIPID PANEL
Cholesterol: 194 mg/dL (ref <200)
HDL: 52 mg/dL (ref >=50)
LDL Cholesterol (Calc): 112 mg/dL — ABNORMAL HIGH
Non-HDL Cholesterol (Calc): 142 mg/dL — ABNORMAL HIGH (ref <130)
Total CHOL/HDL Ratio: 3.7 (calc) (ref <5.0)
Triglycerides: 178 mg/dL — ABNORMAL HIGH (ref <150)

## 2023-02-25 LAB — HEMOGLOBIN A1C
Hgb A1c MFr Bld: 6.1 %{Hb} — ABNORMAL HIGH (ref <5.7)
Mean Plasma Glucose: 128 mg/dL
eAG (mmol/L): 7.1 mmol/L

## 2023-02-25 LAB — TSH: TSH: 2.75 m[IU]/L (ref 0.40–4.50)

## 2023-02-28 ENCOUNTER — Other Ambulatory Visit: Payer: Medicare Other

## 2023-03-03 ENCOUNTER — Other Ambulatory Visit: Payer: Self-pay

## 2023-03-03 NOTE — Patient Instructions (Signed)
Visit Information  Thank you for taking time to visit with me today. Please don't hesitate to contact me if I can be of assistance to you before our next scheduled telephone appointment.  Following are the goals we discussed today:   Goals Addressed             This Visit's Progress    RNCM: Care Management of Chronic Diseases: COPD, Pain, Depression       Current Barriers:  Knowledge Deficits related to plan of care for management of COPD, Depression, and Chronic pain and discomfort  Chronic Disease Management support and education needs related to COPD, Depression, and Chronic pain   RNCM Clinical Goal(s):  Patient will verbalize understanding of plan for management of COPD, Depression, and Osteoarthritis as evidenced by following the plan of care, keeping appointments, receiving regular outreaches, lab work up to date and calling for changes in conditions.  take all medications exactly as prescribed and will call provider for medication related questions as evidenced by compliance with medications attend all scheduled medical appointments: with pcp and specialist  as evidenced by keeping appointments  demonstrate Improved and Ongoing health management independence as evidenced by managing complex conditions   through collaboration with RN Care manager, provider, and care team.   Interventions: Evaluation of current treatment plan related to  self management and patient's adherence to plan as established by provider   COPD Interventions:  (Status:  New goal. and Goal on track:  Yes.) Long Term Goal Provided patient with basic written and verbal COPD education on self care/management/and exacerbation prevention Advised patient to track and manage COPD triggers Provided written and verbal instructions on pursed lip breathing and utilized returned demonstration as teach back Provided instruction about proper use of medications used for management of COPD including inhalers Advised  patient to self assesses COPD action plan zone and make appointment with provider if in the yellow zone for 48 hours without improvement Advised patient to engage in light exercise as tolerated 3-5 days a week to aid in the the management of COPD Provided education about and advised patient to utilize infection prevention strategies to reduce risk of respiratory infection Discussed the importance of adequate rest and management of fatigue with COPD Screening for signs and symptoms of depression related to chronic disease state  Assessed social determinant of health barriers Uses supplemental oxygen at night   Depression  (Status:  New goal. and Goal on track:  Yes.)  Long Term Goal Evaluation of current treatment plan related to Depression,  Has a long standing history of depression, receptive to working with the team for effective management of her depression  self-management and patient's adherence to plan as established by provider. Discussed plans with patient for ongoing care management follow up and provided patient with direct contact information for care management team Evaluation of current treatment plan related to depression  and patient's adherence to plan as established by provider Advised patient to call the office for changes in mood, anxiety, depression, or mental health needs  Provided education to patient re: socialization, working with the CM team, and ongoing support and education to help the patient effectively deal with her depression Reviewed scheduled/upcoming provider appointments including 03-07-2023 at 3 pm Discussed plans with patient for ongoing care management follow up and provided patient with direct contact information for care management team Advised patient to discuss changes in her mood, increased anxiety, depression, or mental health needs with provider Screening for signs and symptoms of  depression related to chronic disease state  Assessed social determinant  of health barriers  Pain Interventions:  (Status:  New goal. and Goal on track:  Yes.) Long Term Goal Pain assessment performed Medications reviewed Reviewed provider established plan for pain management Discussed importance of adherence to all scheduled medical appointments Counseled on the importance of reporting any/all new or changed pain symptoms or management strategies to pain management provider Advised patient to report to care team affect of pain on daily activities Discussed use of relaxation techniques and/or diversional activities to assist with pain reduction (distraction, imagery, relaxation, massage, acupressure, TENS, heat, and cold application Reviewed with patient prescribed pharmacological and nonpharmacological pain relief strategies Advised patient to discuss unresolved pain, or changes in level or intensity of pain with provider Screening for signs and symptoms of depression related to chronic disease state  Assessed social determinant of health barriers  Patient Goals/Self-Care Activities: Take all medications as prescribed Attend all scheduled provider appointments Perform all self care activities independently  Call provider office for new concerns or questions  call the Suicide and Crisis Lifeline: 988 call the [place] National Suicide Prevention Lifeline: 724-780-5256 or TTY: 410 287 4088 TTY 3522436191) to talk to a trained counselor call 1-800-273-TALK (toll free, 24 hour hotline) if experiencing a Mental Health or Behavioral Health Crisis  identify and remove indoor air pollutants listen for public air quality announcements every day do breathing exercises every day develop a rescue plan eliminate symptom triggers at home follow rescue plan if symptoms flare-up  Follow Up Plan:  Telephone follow up appointment with care management team member scheduled for:  04-17-2023 at 1 pm           Our next appointment is by telephone on 04-17-2023 at 1  pm  Please call the care guide team at [phone] if you need to cancel or reschedule your appointment.   If you are experiencing a Mental Health or Behavioral Health Crisis or need someone to talk to, please call the Suicide and Crisis Lifeline: 988 call the [place] National Suicide Prevention Lifeline: (660)858-5611 or TTY: 906-326-8854 TTY (863) 333-1451) to talk to a trained counselor call 1-800-273-TALK (toll free, 24 hour hotline)   The patient verbalized understanding of instructions, educational materials, and care plan provided today and DECLINED offer to receive copy of patient instructions, educational materials, and care plan.     Alto Denver RN, MSN, CCM RN Care Manager  [facility]  Ambulatory Care Management  Direct Number: [phone]

## 2023-03-03 NOTE — Patient Outreach (Signed)
Care Management   Visit Note  03/03/2023 Name: Candace Tapia MRN: [id number] DOB: 1935/04/28  Subjective: Candace Tapia is a 87 y.o. year old female who is a primary care patient of Smitty Cords, DO. The Care Management team was consulted for assistance.      Engaged with patient spoke with patient by telephone.    Goals Addressed             This Visit's Progress    RNCM: Care Management of Chronic Diseases: COPD, Pain, Depression       Current Barriers:  Knowledge Deficits related to plan of care for management of COPD, Depression, and Chronic pain and discomfort  Chronic Disease Management support and education needs related to COPD, Depression, and Chronic pain   RNCM Clinical Goal(s):  Patient will verbalize understanding of plan for management of COPD, Depression, and Osteoarthritis as evidenced by following the plan of care, keeping appointments, receiving regular outreaches, lab work up to date and calling for changes in conditions.  take all medications exactly as prescribed and will call provider for medication related questions as evidenced by compliance with medications attend all scheduled medical appointments: with pcp and specialist  as evidenced by keeping appointments  demonstrate Improved and Ongoing health management independence as evidenced by managing complex conditions   through collaboration with RN Care manager, provider, and care team.   Interventions: Evaluation of current treatment plan related to  self management and patient's adherence to plan as established by provider   COPD Interventions:  (Status:  New goal. and Goal on track:  Yes.) Long Term Goal Provided patient with basic written and verbal COPD education on self care/management/and exacerbation prevention Advised patient to track and manage COPD triggers Provided written and verbal instructions on pursed lip breathing and utilized returned demonstration as teach  back Provided instruction about proper use of medications used for management of COPD including inhalers Advised patient to self assesses COPD action plan zone and make appointment with provider if in the yellow zone for 48 hours without improvement Advised patient to engage in light exercise as tolerated 3-5 days a week to aid in the the management of COPD Provided education about and advised patient to utilize infection prevention strategies to reduce risk of respiratory infection Discussed the importance of adequate rest and management of fatigue with COPD Screening for signs and symptoms of depression related to chronic disease state  Assessed social determinant of health barriers Uses supplemental oxygen at night   Depression  (Status:  New goal. and Goal on track:  Yes.)  Long Term Goal Evaluation of current treatment plan related to Depression,  Has a long standing history of depression, receptive to working with the team for effective management of her depression  self-management and patient's adherence to plan as established by provider. Discussed plans with patient for ongoing care management follow up and provided patient with direct contact information for care management team Evaluation of current treatment plan related to depression  and patient's adherence to plan as established by provider Advised patient to call the office for changes in mood, anxiety, depression, or mental health needs  Provided education to patient re: socialization, working with the CM team, and ongoing support and education to help the patient effectively deal with her depression Reviewed scheduled/upcoming provider appointments including 03-07-2023 at 3 pm Discussed plans with patient for ongoing care management follow up and provided patient with direct contact information for care management team Advised patient to  discuss changes in her mood, increased anxiety, depression, or mental health needs with  provider Screening for signs and symptoms of depression related to chronic disease state  Assessed social determinant of health barriers  Pain Interventions:  (Status:  New goal. and Goal on track:  Yes.) Long Term Goal Pain assessment performed Medications reviewed Reviewed provider established plan for pain management Discussed importance of adherence to all scheduled medical appointments Counseled on the importance of reporting any/all new or changed pain symptoms or management strategies to pain management provider Advised patient to report to care team affect of pain on daily activities Discussed use of relaxation techniques and/or diversional activities to assist with pain reduction (distraction, imagery, relaxation, massage, acupressure, TENS, heat, and cold application Reviewed with patient prescribed pharmacological and nonpharmacological pain relief strategies Advised patient to discuss unresolved pain, or changes in level or intensity of pain with provider Screening for signs and symptoms of depression related to chronic disease state  Assessed social determinant of health barriers  Patient Goals/Self-Care Activities: Take all medications as prescribed Attend all scheduled provider appointments Perform all self care activities independently  Call provider office for new concerns or questions  call the Suicide and Crisis Lifeline: 988 call the [place] National Suicide Prevention Lifeline: 910-607-0517 or TTY: 365-547-6781 TTY 304-198-2921) to talk to a trained counselor call 1-800-273-TALK (toll free, 24 hour hotline) if experiencing a Mental Health or Behavioral Health Crisis  identify and remove indoor air pollutants listen for public air quality announcements every day do breathing exercises every day develop a rescue plan eliminate symptom triggers at home follow rescue plan if symptoms flare-up  Follow Up Plan:  Telephone follow up appointment with care management team  member scheduled for:  04-17-2023 at 1 pm           Consent to Services:  Patient was given information about care management services, agreed to services, and gave verbal consent to participate.   Plan: Telephone follow up appointment with care management team member scheduled for: 04-17-2023 at 1 pm  Alto Denver RN, MSN, CCM RN Care Manager  [facility]  Ambulatory Care Management  Direct Number: [phone]

## 2023-03-06 DIAGNOSIS — Z9981 Dependence on supplemental oxygen: Secondary | ICD-10-CM | POA: Diagnosis not present

## 2023-03-06 DIAGNOSIS — J449 Chronic obstructive pulmonary disease, unspecified: Secondary | ICD-10-CM | POA: Diagnosis not present

## 2023-03-07 ENCOUNTER — Ambulatory Visit (INDEPENDENT_AMBULATORY_CARE_PROVIDER_SITE_OTHER): Payer: Medicare Other | Admitting: Family Medicine

## 2023-03-07 ENCOUNTER — Encounter: Payer: Self-pay | Admitting: Family Medicine

## 2023-03-07 VITALS — BP 122/52 | Ht 64.0 in | Wt 211.0 lb

## 2023-03-07 DIAGNOSIS — N183 Chronic kidney disease, stage 3 unspecified: Secondary | ICD-10-CM | POA: Diagnosis not present

## 2023-03-07 DIAGNOSIS — G894 Chronic pain syndrome: Secondary | ICD-10-CM | POA: Diagnosis not present

## 2023-03-07 DIAGNOSIS — I129 Hypertensive chronic kidney disease with stage 1 through stage 4 chronic kidney disease, or unspecified chronic kidney disease: Secondary | ICD-10-CM

## 2023-03-07 DIAGNOSIS — M150 Primary generalized (osteo)arthritis: Secondary | ICD-10-CM

## 2023-03-07 DIAGNOSIS — E782 Mixed hyperlipidemia: Secondary | ICD-10-CM | POA: Diagnosis not present

## 2023-03-07 DIAGNOSIS — J432 Centrilobular emphysema: Secondary | ICD-10-CM | POA: Diagnosis not present

## 2023-03-07 DIAGNOSIS — F3342 Major depressive disorder, recurrent, in full remission: Secondary | ICD-10-CM | POA: Diagnosis not present

## 2023-03-07 DIAGNOSIS — Z23 Encounter for immunization: Secondary | ICD-10-CM

## 2023-03-07 DIAGNOSIS — R7303 Prediabetes: Secondary | ICD-10-CM | POA: Diagnosis not present

## 2023-03-07 MED ORDER — LOSARTAN POTASSIUM 100 MG PO TABS: 100.0000 mg | ORAL_TABLET | Freq: Every day | ORAL | 3 refills | Status: DC

## 2023-03-07 MED ORDER — CHLORTHALIDONE 25 MG PO TABS: 25.0000 mg | ORAL_TABLET | Freq: Every day | ORAL | 3 refills | Status: DC

## 2023-03-07 NOTE — Assessment & Plan Note (Deleted)
Mood in remission, chronic problem Not on medication

## 2023-03-07 NOTE — Assessment & Plan Note (Signed)
Controlled HTN  History of dizziness is chronic, seems unrelated to medication side effect or HTN - Home BP readings improved Complication with CKD-II  Plan:  Continue current BP regimen Losartan 100mg  daily, Amlodipine 5mg , Chlorthalidone 25mg  daily Encourage improved lifestyle - low sodium diet, regular exercise Continue monitor BP outside office, bring readings to next visit, if persistently >140/90 or new symptoms notify office sooner

## 2023-03-07 NOTE — Patient Instructions (Addendum)
Thank you for coming to the office today.  Labs look good overall  Recent Labs    02/24/23 1105  HGBA1C 6.1*   Sugar is stable  Keep on current meds  Refills added  Fluid weight gain can explain rapid gain at times. Not a big concern at this time. Keep on fluid medication and BP meds.  Try to reduce albuterol to limit sore on tongue  Try Peroxyl oral debridement mouthwash rinse for tongue sore  Please schedule a Follow-up Appointment to: Return in about 6 months (around 09/04/2023) for 6 month PreDM A1c, COPD, HTN updates.  If you have any other questions or concerns, please feel free to call the office or send a message through MyChart. You may also schedule an earlier appointment if necessary.  Additionally, you may be receiving a survey about your experience at our office within a few days to 1 week by e-mail or mail. We value your feedback.  Saralyn Pilar, DO [facility], [place]

## 2023-03-07 NOTE — Progress Notes (Signed)
Subjective:    Patient ID: Candace Tapia, female    DOB: 1935/09/14, 87 y.o.   MRN: [id number]  Candace Tapia is a 87 y.o. female presenting on 03/07/2023 for Annual Exam   HPI  Discussed the use of AI scribe software for clinical note transcription with the patient, who gave verbal consent to proceed.       Pre-Diabetes / obesity BMI >36 Elevated A1c 6.1, stable from last reading on lab. Doing well Meds: Never on meds Limited diet and exercise, admits difficulty exercising due to back hip pain arthritis Denies hypoglycemia, polyuria, visual changes, numbness or tingling.   CHRONIC HTN / Peripheral Edema / Hypokalemia Improved BP control and edema Current Meds - Losartan 100mg  daily, Chlorthalidone 25mg  daily, Amlodipine 5mg  daily   Reports good compliance, took meds today. Taking Potassium daily    Osteoarthritis, multiple joints Chronic joint pain multiple joints. Prior history of shoulder replacement surgery in past She had been on Hydrocodone years ago and did well only short term. She takes Tylenol Ext STr 500mg  x 2 2 to 3 times a day The patient was scheduled to see a pain management specialist for management of chronic osteoarthritis.    Chronic Vestibular disorder Followed by [facility] She has had some dizziness episodes lightheadedness On Meclizine PRN - She has had some worsening dizziness at times.   Recurrent Depression, - in remission Reports she has prior history of some depressed mood but overall has managed to cope with it for long time and currently doing well. Cannot tolerate medication, remains off    Centrilobular Emphysema (COPD) Followed by Dr Gean [facility] Pulm On oxygen at night, nocturnal up to 2L Using inhalers Continue Trelegy She is doing well but often can get short of breath with exertion, worse with mask Has some swelling.  Tongue sore on tip of tongue. Admits using albuterol frequently  Last visit 02/24/23 Referred to  GI for abdominal concerns. Chronic problem with possible IBS symptoms Ordered dicyclomine and it has shown some benefit for her. She takes it AS NEEDED No response yet from GI, we will follow up   Health Maintenance: Flu Shot today     02/24/2023   10:05 AM 08/24/2022    2:40 PM 07/29/2022    1:33 PM  Depression screen PHQ 2/9  Decreased Interest 1 1 0  Down, Depressed, Hopeless 1 1 0  PHQ - 2 Score 2 2 0  Altered sleeping 0 0 0  Tired, decreased energy 2 1 0  Change in appetite 1 1 0  Feeling bad or failure about yourself  0 0 0  Trouble concentrating 0 0 0  Moving slowly or fidgety/restless 1 1 0  Suicidal thoughts 1 0 0  PHQ-9 Score 7 5 0  Difficult doing work/chores Not difficult at all  Not difficult at all       02/24/2023   10:05 AM 08/24/2022    2:40 PM 02/23/2022    2:01 PM 08/31/2021    1:50 PM  GAD 7 : Generalized Anxiety Score  Nervous, Anxious, on Edge 0 0 0 0  Control/stop worrying 0 0 0 0  Worry too much - different things 0 0 0 0  Trouble relaxing 0 0 0 0  Restless 0 0 0 0  Easily annoyed or irritable 0 1 0 0  Afraid - awful might happen 0 0 0 0  Total GAD 7 Score 0 1 0 0  Anxiety Difficulty  Not difficult at all Not difficult at all     Past Medical History:  Diagnosis Date   Allergy    Arthritis    Breast cancer [facility]) 2014   radiation and taking exemestane   Cancer of left breast (HCC) 10/27/2014   GERD (gastroesophageal reflux disease)    H/O: hysterectomy    History of lumpectomy 2014   LT breast   Past Surgical History:  Procedure Laterality Date   ABDOMINAL HYSTERECTOMY     BREAST LUMPECTOMY Left 2014   BREAST SURGERY Left 2014   JOINT REPLACEMENT     LYMPH NODE DISSECTION     neck   TOTAL SHOULDER REPLACEMENT Bilateral 2010 and 2013   Social History   Socioeconomic History   Marital status: Widowed    Spouse name: Not on file   Number of children: 2   Years of education: Not on file   Highest education level: 8th grade   Occupational History   Occupation: Retired  Tobacco Use   Smoking status: Former    Current packs/day: 0.00    Average packs/day: 2.0 packs/day for 40.0 years (80.0 ttl pk-yrs)    Types: Cigarettes    Start date: 04/19/1953    Quit date: 04/19/1993    Years since quitting: 29.9   Smokeless tobacco: Never  Vaping Use   Vaping status: Not on file  Substance and Sexual Activity   Alcohol use: No    Alcohol/week: 0.0 standard drinks of alcohol   Drug use: No   Sexual activity: Not Currently  Other Topics Concern   Not on file  Social History Narrative   Not on file   Social Determinants of Health   Financial Resource Strain: Low Risk  (03/06/2023)   Received [facility]   Overall Financial Resource Strain (CARDIA)    Difficulty of Paying Living Expenses: Not hard at all  Food Insecurity: No Food Insecurity (03/06/2023)   [facility] from [facility]   Hunger Vital Sign    Worried About Running Out of Food in the Last Year: Never true    Ran Out of Food in the Last Year: Never true  Transportation Needs: No Transportation Needs (03/06/2023)   Received from [facility] - Transportation    In the past 12 months, has lack of transportation kept you from medical appointments or from getting medications?: No    Lack of Transportation (Non-Medical): No  Physical Activity: Inactive (03/03/2023)   Exercise Vital Sign    Days of Exercise per Week: 0 days    Minutes of Exercise per Session: 0 min  Stress: No Stress Concern Present (03/03/2023)   [employer] of Occupational Health - Occupational Stress Questionnaire    Feeling of Stress : Not at all  Social Connections: Socially Isolated (03/03/2023)   Social Connection and Isolation Panel [NHANES]    Frequency of Communication with Friends and Family: More than three times a week    Frequency of Social Gatherings with Friends and Family: More than three times a  week    Attends Religious Services: Never    Database administrator or Organizations: No    Attends Banker Meetings: Never    Marital Status: Widowed  Intimate Partner Violence: Not At Risk (03/03/2023)   Humiliation, Afraid, Rape, and Kick questionnaire    Fear of Current or Ex-Partner: No    Emotionally Abused: No    Physically Abused: No  Sexually Abused: No   Family History  Problem Relation Age of Onset   Breast cancer Mother 62   Diabetes Mother    Kidney disease Father    Diabetes Father    Breast cancer Sister 8   Current Outpatient Medications on File Prior to Visit  Medication Sig   acetaminophen (TYLENOL) 650 MG CR tablet Take 650 mg by mouth every 8 (eight) hours as needed for pain.   albuterol (VENTOLIN HFA) 108 (90 Base) MCG/ACT inhaler Inhale 1 puff into the lungs every 6 (six) hours as needed.    amLODipine (NORVASC) 5 MG tablet TAKE 1 TABLET(5 MG) BY MOUTH DAILY   aspirin 81 MG chewable tablet Chew 81 mg by mouth daily as needed.   Cholecalciferol 25 MCG (1000 UT) capsule Vitamin D3 25 mcg (1,000 unit) capsule  1 po qd   dicyclomine (BENTYL) 10 MG capsule Take 1 capsule (10 mg total) by mouth 3 (three) times daily before meals. Take as needed for abdominal bloating / cramping symptoms   famotidine (PEPCID) 20 MG tablet Take 1 tablet (20 mg total) by mouth 2 (two) times daily.   fluticasone (FLONASE) 50 MCG/ACT nasal spray Place 1 spray into both nostrils as needed.    gabapentin (NEURONTIN) 400 MG capsule TAKE 1 CAPSULE(400 MG) BY MOUTH TWICE DAILY   meclizine (ANTIVERT) 25 MG tablet Take 0.5 tablets (12.5 mg total) by mouth every 8 (eight) hours as needed for dizziness.   meloxicam (MOBIC) 7.5 MG tablet Take 7.5 mg by mouth 3 (three) times daily.   Multiple Vitamins-Minerals (MULTIVITAMIN WOMEN 50+) TABS Take 1 tablet by mouth daily.    NEXIUM 20 MG capsule Take 1 capsule (20 mg total) by mouth daily.   ondansetron (ZOFRAN-ODT) 4 MG  disintegrating tablet Take 1 tablet (4 mg total) by mouth every 8 (eight) hours as needed for nausea or vomiting.   potassium chloride (MICRO-K) 10 MEQ CR capsule TAKE 1 CAPSULE(10 MEQ) BY MOUTH DAILY. MAY OPEN CAPSULE SPRINKLE ON SOFT FOOD OR. SWALLOW WHOLE   Probiotic Product (PROBIOTIC DAILY PO) Take by mouth.   Spacer/Aero-Holding Chambers (AEROCHAMBER PLUS) inhaler Use as instructed   [DISCONTINUED] fexofenadine (ALLEGRA ALLERGY) 180 MG tablet Take 180 mg by mouth as needed. Reported on 09/29/2015   No current facility-administered medications on file prior to visit.    Review of Systems  Constitutional:  Negative for activity change, appetite change, chills, diaphoresis, fatigue and fever.  HENT:  Negative for congestion and hearing loss.   Eyes:  Negative for visual disturbance.  Respiratory:  Negative for cough, chest tightness, shortness of breath and wheezing.   Cardiovascular:  Negative for chest pain, palpitations and leg swelling.  Gastrointestinal:  Negative for abdominal pain, constipation, diarrhea, nausea and vomiting.  Genitourinary:  Negative for dysuria, frequency and hematuria.  Musculoskeletal:  Negative for arthralgias and neck pain.  Skin:  Negative for rash.  Neurological:  Negative for dizziness, weakness, light-headedness, numbness and headaches.  Hematological:  Negative for adenopathy.  Psychiatric/Behavioral:  Negative for behavioral problems, dysphoric mood and sleep disturbance.    Per HPI unless specifically indicated above     Objective:    BP (!) 122/52   Ht 5\' 4"  (1.626 m)   Wt 211 lb (95.7 kg)   BMI 36.22 kg/m   Wt Readings from Last 3 Encounters:  03/07/23 211 lb (95.7 kg)  02/24/23 208 lb (94.3 kg)  08/24/22 206 lb (93.4 kg)    Physical Exam Vitals and nursing note  reviewed.  Constitutional:      General: She is not in acute distress.    Appearance: She is well-developed. She is not diaphoretic.     Comments: Well-appearing,  comfortable, cooperative  HENT:     Head: Normocephalic and atraumatic.  Eyes:     General:        Right eye: No discharge.        Left eye: No discharge.     Conjunctiva/sclera: Conjunctivae normal.     Pupils: Pupils are equal, round, and reactive to light.  Neck:     Thyroid: No thyromegaly.  Cardiovascular:     Rate and Rhythm: Normal rate and regular rhythm.     Pulses: Normal pulses.     Heart sounds: Normal heart sounds. No murmur heard. Pulmonary:     Effort: Pulmonary effort is normal. No respiratory distress.     Breath sounds: Normal breath sounds. No wheezing or rales.  Abdominal:     General: Bowel sounds are normal. There is no distension.     Palpations: Abdomen is soft. There is no mass.     Tenderness: There is no abdominal tenderness.  Musculoskeletal:        General: No tenderness. Normal range of motion.     Cervical back: Normal range of motion and neck supple.     Right lower leg: Edema present.     Left lower leg: Edema present.     Comments: Upper / Lower Extremities: - Normal muscle tone, strength bilateral upper extremities 5/5, lower extremities 5/5  Lymphadenopathy:     Cervical: No cervical adenopathy.  Skin:    General: Skin is warm and dry.     Findings: No erythema or rash.  Neurological:     Mental Status: She is alert and oriented to person, place, and time.     Comments: Distal sensation intact to light touch all extremities  Psychiatric:        Mood and Affect: Mood normal.        Behavior: Behavior normal.        Thought Content: Thought content normal.     Comments: Well groomed, good eye contact, normal speech and thoughts     Results for orders placed or performed in visit on 01/30/23  TSH  Result Value Ref Range   TSH 2.75 0.40 - 4.50 mIU/L  Lipid panel  Result Value Ref Range   Cholesterol 194 <200 mg/dL   HDL 52 > OR = 50 mg/dL   Triglycerides 696 (H) <150 mg/dL   LDL Cholesterol (Calc) 112 (H) mg/dL (calc)   Total  CHOL/HDL Ratio 3.7 <5.0 (calc)   Non-HDL Cholesterol (Calc) 142 (H) <130 mg/dL (calc)  Hemoglobin E9B  Result Value Ref Range   Hgb A1c MFr Bld 6.1 (H) <5.7 % of total Hgb   Mean Plasma Glucose 128 mg/dL   eAG (mmol/L) 7.1 mmol/L  CBC with Differential/Platelet  Result Value Ref Range   WBC 9.8 3.8 - 10.8 Thousand/uL   RBC 4.73 3.80 - 5.10 Million/uL   Hemoglobin 12.9 11.7 - 15.5 g/dL   HCT 28.4 13.2 - 44.0 %   MCV 86.3 80.0 - 100.0 fL   MCH 27.3 27.0 - 33.0 pg   MCHC 31.6 (L) 32.0 - 36.0 g/dL   RDW 10.2 72.5 - 36.6 %   Platelets 298 140 - 400 Thousand/uL   MPV 11.2 7.5 - 12.5 fL   Neutro Abs 7,752 1,500 - 7,800 cells/uL  Absolute Lymphocytes 1,235 850 - 3,900 cells/uL   Absolute Monocytes 725 200 - 950 cells/uL   Eosinophils Absolute 49 15 - 500 cells/uL   Basophils Absolute 39 0 - 200 cells/uL   Neutrophils Relative % 79.1 %   Total Lymphocyte 12.6 %   Monocytes Relative 7.4 %   Eosinophils Relative 0.5 %   Basophils Relative 0.4 %  COMPLETE METABOLIC PANEL WITH GFR  Result Value Ref Range   Glucose, Bld 95 65 - 99 mg/dL   BUN 16 7 - 25 mg/dL   Creat 0.27 (H) 2.53 - 0.95 mg/dL   eGFR 56 (L) > OR = 60 mL/min/1.29m2   BUN/Creatinine Ratio 16 6 - 22 (calc)   Sodium 141 135 - 146 mmol/L   Potassium 3.8 3.5 - 5.3 mmol/L   Chloride 100 98 - 110 mmol/L   CO2 29 20 - 32 mmol/L   Calcium 10.2 8.6 - 10.4 mg/dL   Total Protein 7.0 6.1 - 8.1 g/dL   Albumin 4.4 3.6 - 5.1 g/dL   Globulin 2.6 1.9 - 3.7 g/dL (calc)   AG Ratio 1.7 1.0 - 2.5 (calc)   Total Bilirubin 0.4 0.2 - 1.2 mg/dL   Alkaline phosphatase (APISO) 73 37 - 153 U/L   AST 14 10 - 35 U/L   ALT 11 6 - 29 U/L      Assessment & Plan:   Problem List Items Addressed This Visit     Chronic pain (Chronic)   Benign hypertension with CKD (chronic kidney disease) stage III (HCC) - Primary   Relevant Medications   chlorthalidone (HYGROTON) 25 MG tablet   losartan (COZAAR) 100 MG tablet   Centrilobular emphysema (HCC)    Hyperlipidemia   Relevant Medications   chlorthalidone (HYGROTON) 25 MG tablet   losartan (COZAAR) 100 MG tablet   Major depression in full remission (HCC)   Osteoarthritis of multiple joints   Pre-diabetes   Other Visit Diagnoses     Needs flu shot       Relevant Orders   Flu Vaccine Trivalent High Dose (Fluad) (Completed)        Updated Health Maintenance information Reviewed recent lab results with patient Encouraged improvement to lifestyle with diet and exercise Goal of weight loss  Major Depression recurrent In remission Not on medication  Abdominal Pain / IBS Improvement noted with dicyclomine. Referred to gastroenterology for further evaluation. -Continue dicyclomine as needed. -Follow up with gastroenterology - we will contact referral coordinator to assist with updates  Oral Ulcer on tongue mild, healing Possible irritation from albuterol inhaler use. -Reduce albuterol puffs as tolerated. -Try over-the-counter mouthwash Peroxyl oral debridement for healing skin and sores.  Prediabetes Stable A1C at 6.1. -Continue current management.  Hyperlipidemia Stable cholesterol levels. Patient has history of intolerance to statins. -No changes to current management.  Hypertension Controlled -Continue chlorthalidone.  Lightheadedness Likely positional, exacerbated by morning fluid shifts. -Advise caution with position changes, especially in the morning.  General Health Maintenance -Up to date on vaccines. Consider shingles vaccine. -Continue monitoring weight and fluid balance. -Follow up in 6 months.      Route chart to [facility] to check status of GI referral   Orders Placed This Encounter  Procedures   Flu Vaccine Trivalent High Dose (Fluad)    Meds ordered this encounter  Medications   chlorthalidone (HYGROTON) 25 MG tablet    Sig: Take 1 tablet (25 mg total) by mouth daily.    Dispense:  90 tablet  Refill:  3    Add future refills    losartan (COZAAR) 100 MG tablet    Sig: Take 1 tablet (100 mg total) by mouth daily.    Dispense:  90 tablet    Refill:  3    Add future refills     Follow up plan: Return in about 6 months (around 09/04/2023) for 6 month PreDM A1c, COPD, HTN updates.  Saralyn Pilar, DO [facility] 03/07/2023, 3:16 PM

## 2023-03-08 ENCOUNTER — Other Ambulatory Visit: Payer: Self-pay | Admitting: Family Medicine

## 2023-03-08 DIAGNOSIS — M48061 Spinal stenosis, lumbar region without neurogenic claudication: Secondary | ICD-10-CM

## 2023-03-08 DIAGNOSIS — N183 Chronic kidney disease, stage 3 unspecified: Secondary | ICD-10-CM

## 2023-03-08 DIAGNOSIS — Z79891 Long term (current) use of opiate analgesic: Secondary | ICD-10-CM | POA: Diagnosis not present

## 2023-03-08 DIAGNOSIS — M47896 Other spondylosis, lumbar region: Secondary | ICD-10-CM | POA: Diagnosis not present

## 2023-03-08 DIAGNOSIS — G894 Chronic pain syndrome: Secondary | ICD-10-CM | POA: Diagnosis not present

## 2023-03-08 HISTORY — DX: Spinal stenosis, lumbar region without neurogenic claudication: M48.061

## 2023-03-09 NOTE — Telephone Encounter (Signed)
Both reordered 03/07/23 #90 3 RF  Requested Prescriptions  Refused Prescriptions Disp Refills   losartan (COZAAR) 100 MG tablet [Pharmacy Med Name: LOSARTAN 100MG  TABLETS] 90 tablet 3    Sig: TAKE 1 TABLET(100 MG) BY MOUTH DAILY     Cardiovascular:  Angiotensin Receptor Blockers Failed - 03/08/2023  7:10 AM      Failed - Cr in normal range and within 180 days    Creat  Date Value Ref Range Status  02/24/2023 0.99 (H) 0.60 - 0.95 mg/dL Final         Passed - K in normal range and within 180 days    Potassium  Date Value Ref Range Status  02/24/2023 3.8 3.5 - 5.3 mmol/L Final  04/28/2014 4.1 3.5 - 5.1 mmol/L Final         Passed - Patient is not pregnant      Passed - Last BP in normal range    BP Readings from Last 1 Encounters:  03/07/23 (!) 122/52         Passed - Valid encounter within last 6 months    Recent Outpatient Visits           2 days ago Benign hypertension with CKD (chronic kidney disease) stage III (HCC)   [place] [facility] Smitty Cords, DO   1 week ago Epigastric abdominal pain   [place] [place], Netta Neat, DO   6 months ago Benign hypertension with CKD (chronic kidney disease), stage II   [facility] [place], Netta Neat, DO   8 months ago Diarrhea, unspecified type   [place] [facility], Netta Neat, DO   1 year ago Annual physical exam   [place] [facility] Smitty Cords, DO       Future Appointments             In 5 months Althea Charon, Netta Neat, DO [place] [facility], [facility]             chlorthalidone (HYGROTON) 25 MG tablet [Pharmacy Med Name: CHLORTHALIDONE 25MG  TABLETS] 90 tablet 3    Sig: TAKE 1 TABLET(25 MG) BY MOUTH DAILY     Cardiovascular: Diuretics - Thiazide Failed - 03/08/2023  7:10 AM      Failed - Cr in normal range and within 180 days     Creat  Date Value Ref Range Status  02/24/2023 0.99 (H) 0.60 - 0.95 mg/dL Final         Passed - K in normal range and within 180 days    Potassium  Date Value Ref Range Status  02/24/2023 3.8 3.5 - 5.3 mmol/L Final  04/28/2014 4.1 3.5 - 5.1 mmol/L Final         Passed - Na in normal range and within 180 days    Sodium  Date Value Ref Range Status  02/24/2023 141 135 - 146 mmol/L Final  06/12/2015 137 134 - 144 mmol/L Final  04/28/2014 139 136 - 145 mmol/L Final         Passed - Last BP in normal range    BP Readings from Last 1 Encounters:  03/07/23 (!) 122/52         Passed - Valid encounter within last 6 months    Recent Outpatient Visits           2 days ago Benign hypertension with CKD (chronic  kidney disease) stage III [facility])   [place] [facility] [place], Netta Neat, DO   1 week ago Epigastric abdominal pain   [place] [facility] [place], Netta Neat, DO   6 months ago Benign hypertension with CKD (chronic kidney disease), stage II   [place] [facility] [place], Netta Neat, DO   8 months ago Diarrhea, unspecified type   [facility] Smitty Cords, DO   1 year ago Annual physical exam   [place] [facility] Smitty Cords, DO       Future Appointments             In 5 months Althea Charon, Netta Neat, DO  [facility], [facility]

## 2023-03-10 ENCOUNTER — Encounter: Payer: Medicare Other | Admitting: Family Medicine

## 2023-04-17 ENCOUNTER — Ambulatory Visit (INDEPENDENT_AMBULATORY_CARE_PROVIDER_SITE_OTHER): Payer: Medicare Other | Admitting: Family Medicine

## 2023-04-17 ENCOUNTER — Other Ambulatory Visit: Payer: Self-pay

## 2023-04-17 ENCOUNTER — Ambulatory Visit: Payer: Self-pay | Admitting: *Deleted

## 2023-04-17 ENCOUNTER — Encounter: Payer: Self-pay | Admitting: Family Medicine

## 2023-04-17 VITALS — BP 124/68 | HR 87 | Ht 64.0 in | Wt 209.0 lb

## 2023-04-17 DIAGNOSIS — F419 Anxiety disorder, unspecified: Secondary | ICD-10-CM | POA: Insufficient documentation

## 2023-04-17 DIAGNOSIS — F3342 Major depressive disorder, recurrent, in full remission: Secondary | ICD-10-CM

## 2023-04-17 DIAGNOSIS — K1379 Other lesions of oral mucosa: Secondary | ICD-10-CM

## 2023-04-17 DIAGNOSIS — F331 Major depressive disorder, recurrent, moderate: Secondary | ICD-10-CM | POA: Diagnosis not present

## 2023-04-17 MED ORDER — MAGIC MOUTHWASH W/LIDOCAINE: ORAL | 0 refills | Status: DC

## 2023-04-17 MED ORDER — ESCITALOPRAM OXALATE 10 MG PO TABS: 10.0000 mg | ORAL_TABLET | Freq: Every day | ORAL | 2 refills | Status: DC

## 2023-04-17 NOTE — Telephone Encounter (Signed)
  Chief Complaint: anxiety worsening per patient son Loraine Leriche on [place] Symptoms: anxiety . Chronic health issues, oldest son dx terminal illness with CA Frequency: na Pertinent Negatives: Patient denies chest pain no difficulty breathing no report hurting self or others  Disposition: [] ED /[] Urgent Care (no appt availability in office) / [x] Appointment(In office/virtual)/ []  [place] [facility]/ [] Home Care/ [] Refused Recommended Disposition /[] [place] [facility]/ []  Follow-up with PCP Additional Notes:   Appt scheduled today     Reason for Disposition  Patient sounds very upset or troubled to the triager  Answer Assessment - Initial Assessment Questions 1. CONCERN: "Did anything happen that prompted you to call today?"      Feeling over overwhelmed due to oldest son terminally ill with CA 2. ANXIETY SYMPTOMS: "Can you describe how you (your loved one; patient) have been feeling?" (e.g., tense, restless, panicky, anxious, keyed up, overwhelmed, sense of impending doom).      Anxious overwhelmed 3. ONSET: "How long have you been feeling this way?" (e.g., hours, days, weeks)     Na  4. SEVERITY: "How would you rate the level of anxiety?" (e.g., 0 - 10; or mild, moderate, severe).     Did not rate 5. FUNCTIONAL IMPAIRMENT: "How have these feelings affected your ability to do daily activities?" "Have you had more difficulty than usual doing your normal daily activities?" (e.g., getting better, same, worse; self-care, school, work, interactions)     Per patient son Loraine Leriche , pt getting worse handling anxiety  6. HISTORY: "Have you felt this way before?" "Have you ever been diagnosed with an anxiety problem in the past?" (e.g., generalized anxiety disorder, panic attacks, PTSD). If Yes, ask: "How was this problem treated?" (e.g., medicines, counseling, etc.)     Chronic health issues  7. RISK OF HARM - SUICIDAL IDEATION: "Do you ever have thoughts of hurting or killing yourself?" If Yes, ask:   "Do you have these feelings now?" "Do you have a plan on how you would do this?"     na 8. TREATMENT:  "What has been done so far to treat this anxiety?" (e.g., medicines, relaxation strategies). "What has helped?"     Na  9. TREATMENT - THERAPIST: "Do you have a counselor or therapist? Name?"     na 10. POTENTIAL TRIGGERS: "Do you drink caffeinated beverages (e.g., coffee, colas, teas), and how much daily?" "Do you drink alcohol or use any drugs?" "Have you started any new medicines recently?"       na 11. PATIENT SUPPORT: "Who is with you now?" "Who do you live with?" "Do you have family or friends who you can talk to?"        Son  12. OTHER SYMPTOMS: "Do you have any other symptoms?" (e.g., feeling depressed, trouble concentrating, trouble sleeping, trouble breathing, palpitations or fast heartbeat, chest pain, sweating, nausea, or diarrhea)       Overwhelmed chronic health issues  13. PREGNANCY: "Is there any chance you are pregnant?" "When was your last menstrual period?"       na  Protocols used: Anxiety and Panic Attack-A-AH

## 2023-04-17 NOTE — Progress Notes (Signed)
Subjective:    Patient ID: Candace Tapia, female    DOB: 20-Apr-1935, 87 y.o.   MRN: [id number]  Candace Tapia is a 87 y.o. female presenting on 04/17/2023 for Medical Management of Chronic Issues  Patient presents for a same day appointment.  HPI  Discussed the use of AI scribe software for clinical note transcription with the patient, who gave verbal consent to proceed.  History of Present Illness    The patient, with a history of anxiety and depression, presented with a chief complaint of recurrent oral ulcers. The patient reported the onset of these ulcers around Christmas, with a brief period of resolution before she returned. Originally treated 03/07/23 with oral debridement mouthwash peroxyl. The ulcers were described as extremely painful, located on the tongue, under the tongue, and on the lower lip, and were likened to a burn. The pain was so severe that it significantly impacted the patient's ability to eat, leading to a diet primarily consisting of milk, buttermilk, and yogurt. The patient had been managing the pain with over-the-counter gum sore treatments and throat sprays, as well as rinsing with salt water and an over-the-counter mouthwash.  The patient also reported significant stress and anxiety, which had been exacerbated by a family member's health issues. The patient's anxiety was so severe that it led to a significant increase in depressive symptoms. The patient had a history of depression and had previously been treated with various medications, but had stopped due to intolerable side effects. Including Sertraline in the past.      04/17/2023    1:46 PM 02/24/2023   10:05 AM 08/24/2022    2:40 PM  Depression screen PHQ 2/9  Decreased Interest 2 1 1   Down, Depressed, Hopeless 2 1 1   PHQ - 2 Score 4 2 2   Altered sleeping 1 0 0  Tired, decreased energy 2 2 1   Change in appetite 2 1 1   Feeling bad or failure about yourself  0 0 0  Trouble concentrating 1 0 0   Moving slowly or fidgety/restless 0 1 1  Suicidal thoughts 0 1 0  PHQ-9 Score 10 7 5   Difficult doing work/chores Somewhat difficult Not difficult at all        04/17/2023    1:47 PM 02/24/2023   10:05 AM 08/24/2022    2:40 PM 02/23/2022    2:01 PM  GAD 7 : Generalized Anxiety Score  Nervous, Anxious, on Edge 2 0 0 0  Control/stop worrying 1 0 0 0  Worry too much - different things 0 0 0 0  Trouble relaxing 1 0 0 0  Restless 0 0 0 0  Easily annoyed or irritable 0 0 1 0  Afraid - awful might happen 0 0 0 0  Total GAD 7 Score 4 0 1 0  Anxiety Difficulty Somewhat difficult   Not difficult at all    Social History   Tobacco Use   Smoking status: Former    Current packs/day: 0.00    Average packs/day: 2.0 packs/day for 40.0 years (80.0 ttl pk-yrs)    Types: Cigarettes    Start date: 04/19/1953    Quit date: 04/19/1993    Years since quitting: 30.0   Smokeless tobacco: Never  Substance Use Topics   Alcohol use: No    Alcohol/week: 0.0 standard drinks of alcohol   Drug use: No    Review of Systems Per HPI unless specifically indicated above     Objective:  BP 124/68   Pulse 87   Ht 5\' 4"  (1.626 m)   Wt 209 lb (94.8 kg)   SpO2 97%   BMI 35.87 kg/m   Wt Readings from Last 3 Encounters:  04/17/23 209 lb (94.8 kg)  03/07/23 211 lb (95.7 kg)  02/24/23 208 lb (94.3 kg)    Physical Exam Vitals and nursing note reviewed.  Constitutional:      General: She is not in acute distress.    Appearance: Normal appearance. She is well-developed. She is not diaphoretic.     Comments: Well-appearing, comfortable, cooperative  HENT:     Head: Normocephalic and atraumatic.     Mouth/Throat:     Comments: Smal 1 mm approx shallow ulcers on inside of lower lip, tongue without obvious deformity or pathology Eyes:     General:        Right eye: No discharge.        Left eye: No discharge.     Conjunctiva/sclera: Conjunctivae normal.  Cardiovascular:     Rate and Rhythm: Normal  rate.  Pulmonary:     Effort: Pulmonary effort is normal.  Skin:    General: Skin is warm and dry.     Findings: No erythema or rash.  Neurological:     Mental Status: She is alert and oriented to person, place, and time.  Psychiatric:        Mood and Affect: Mood normal.        Behavior: Behavior normal.        Thought Content: Thought content normal.     Comments: Well groomed, good eye contact, normal speech and thoughts     Results for orders placed or performed in visit on 01/30/23  TSH   Collection Time: 02/24/23 11:05 AM  Result Value Ref Range   TSH 2.75 0.40 - 4.50 mIU/L  Lipid panel   Collection Time: 02/24/23 11:05 AM  Result Value Ref Range   Cholesterol 194 <200 mg/dL   HDL 52 > OR = 50 mg/dL   Triglycerides 784 (H) <150 mg/dL   LDL Cholesterol (Calc) 112 (H) mg/dL (calc)   Total CHOL/HDL Ratio 3.7 <5.0 (calc)   Non-HDL Cholesterol (Calc) 142 (H) <130 mg/dL (calc)  Hemoglobin O9G   Collection Time: 02/24/23 11:05 AM  Result Value Ref Range   Hgb A1c MFr Bld 6.1 (H) <5.7 % of total Hgb   Mean Plasma Glucose 128 mg/dL   eAG (mmol/L) 7.1 mmol/L  CBC with Differential/Platelet   Collection Time: 02/24/23 11:05 AM  Result Value Ref Range   WBC 9.8 3.8 - 10.8 Thousand/uL   RBC 4.73 3.80 - 5.10 Million/uL   Hemoglobin 12.9 11.7 - 15.5 g/dL   HCT 29.5 28.4 - 13.2 %   MCV 86.3 80.0 - 100.0 fL   MCH 27.3 27.0 - 33.0 pg   MCHC 31.6 (L) 32.0 - 36.0 g/dL   RDW 44.0 10.2 - 72.5 %   Platelets 298 140 - 400 Thousand/uL   MPV 11.2 7.5 - 12.5 fL   Neutro Abs 7,752 1,500 - 7,800 cells/uL   Absolute Lymphocytes 1,235 850 - 3,900 cells/uL   Absolute Monocytes 725 200 - 950 cells/uL   Eosinophils Absolute 49 15 - 500 cells/uL   Basophils Absolute 39 0 - 200 cells/uL   Neutrophils Relative % 79.1 %   Total Lymphocyte 12.6 %   Monocytes Relative 7.4 %   Eosinophils Relative 0.5 %   Basophils Relative 0.4 %  COMPLETE METABOLIC  PANEL WITH GFR   Collection Time: 02/24/23  11:05 AM  Result Value Ref Range   Glucose, Bld 95 65 - 99 mg/dL   BUN 16 7 - 25 mg/dL   Creat 1.61 (H) 0.96 - 0.95 mg/dL   eGFR 56 (L) > OR = 60 mL/min/1.14m2   BUN/Creatinine Ratio 16 6 - 22 (calc)   Sodium 141 135 - 146 mmol/L   Potassium 3.8 3.5 - 5.3 mmol/L   Chloride 100 98 - 110 mmol/L   CO2 29 20 - 32 mmol/L   Calcium 10.2 8.6 - 10.4 mg/dL   Total Protein 7.0 6.1 - 8.1 g/dL   Albumin 4.4 3.6 - 5.1 g/dL   Globulin 2.6 1.9 - 3.7 g/dL (calc)   AG Ratio 1.7 1.0 - 2.5 (calc)   Total Bilirubin 0.4 0.2 - 1.2 mg/dL   Alkaline phosphatase (APISO) 73 37 - 153 U/L   AST 14 10 - 35 U/L   ALT 11 6 - 29 U/L      Assessment & Plan:   Problem List Items Addressed This Visit     Anxiety - Primary   Relevant Medications   escitalopram (LEXAPRO) 10 MG tablet   Major depressive disorder, recurrent, moderate (HCC)   Relevant Medications   escitalopram (LEXAPRO) 10 MG tablet   Other Visit Diagnoses       Recurrent oral ulcers       Relevant Medications   magic mouthwash w/lidocaine SOLN        Oral Ulcers Recurrent oral ulcers, possibly stress-induced. Previous treatment with over-the-counter mouthwash provided temporary relief. -Prescribe [facility]'s Magic Mouthwash, to be used 1-2 teaspoons every six hours as needed for approximately a week. If still unsuccessful we would consider ENT vs Dentist for oral evaluation  Anxiety and Depression Patient reports significant stress and has a history of depression. Failed Sertraline 2016 approx  -Start Escitalopram (Lexapro) 10mg  daily. -Check for tolerance and effectiveness after 30 days.         No orders of the defined types were placed in this encounter.   Meds ordered this encounter  Medications   magic mouthwash w/lidocaine SOLN    Sig: Swish, gargle, and spit one to two teaspoonfuls every six hours as needed. Shake well before using.    Dispense:  120 mL    Refill:  0    1 Part viscous lidocaine 2%  1 Part Maalox  1  Part diphenhydramine 12.5 mg per 5 ml elixir 1 Part Nystatin   escitalopram (LEXAPRO) 10 MG tablet    Sig: Take 1 tablet (10 mg total) by mouth daily.    Dispense:  30 tablet    Refill:  2    Follow up plan: Return if symptoms worsen or fail to improve.   Saralyn Pilar, DO [facility] 04/17/2023, 11:42 AM

## 2023-04-17 NOTE — Patient Instructions (Addendum)
Thank you for coming to the office today.  Start Escitalopram 10mg  daily with meal Possible GI side effects  It may take 3-4 weeks for full effect  Try the [facility]'s Magic Mouthwash to see if it helps heal the oral sores  It could be due to stress  If need next steps involve dentist or ENT if we need a new evaluation of ulcers.  Please schedule a Follow-up Appointment to: Return if symptoms worsen or fail to improve.  If you have any other questions or concerns, please feel free to call the office or send a message through MyChart. You may also schedule an earlier appointment if necessary.  Additionally, you may be receiving a survey about your experience at our office within a few days to 1 week by e-mail or mail. We value your feedback.  Saralyn Pilar, DO [facility], [place]

## 2023-04-26 ENCOUNTER — Telehealth: Payer: Self-pay

## 2023-04-26 ENCOUNTER — Other Ambulatory Visit: Payer: Self-pay

## 2023-04-26 NOTE — Patient Outreach (Signed)
  Care Management   Outreach Note  04/26/2023 Name: Candace Tapia MRN: [id number] DOB: 11-02-1935  An unsuccessful outreach attempt was made today for a scheduled Care Management visit.   Follow Up Plan:  Phone rang  multiple times today without option to leave a voice message. A member of the care team will make additional outreach attempts.   [facility]  [facility], [facility] RN Care Manager Direct Dial: [phone] Website: [email or web address]

## 2023-05-10 DIAGNOSIS — M47896 Other spondylosis, lumbar region: Secondary | ICD-10-CM | POA: Diagnosis not present

## 2023-05-10 DIAGNOSIS — G8929 Other chronic pain: Secondary | ICD-10-CM | POA: Diagnosis not present

## 2023-05-10 DIAGNOSIS — M48061 Spinal stenosis, lumbar region without neurogenic claudication: Secondary | ICD-10-CM | POA: Diagnosis not present

## 2023-05-10 DIAGNOSIS — Z79891 Long term (current) use of opiate analgesic: Secondary | ICD-10-CM | POA: Diagnosis not present

## 2023-05-14 ENCOUNTER — Other Ambulatory Visit: Payer: Self-pay | Admitting: Family Medicine

## 2023-05-14 DIAGNOSIS — G629 Polyneuropathy, unspecified: Secondary | ICD-10-CM

## 2023-05-15 ENCOUNTER — Other Ambulatory Visit: Payer: Self-pay

## 2023-05-15 NOTE — Telephone Encounter (Signed)
Requested Prescriptions  Pending Prescriptions Disp Refills   gabapentin (NEURONTIN) 400 MG capsule [Pharmacy Med Name: GABAPENTIN 400MG  CAPSULES] 180 capsule 0    Sig: TAKE 1 CAPSULE(400 MG) BY MOUTH TWICE DAILY     Neurology: Anticonvulsants - gabapentin Failed - 05/15/2023  1:52 PM      Failed - Cr in normal range and within 360 days    Creat  Date Value Ref Range Status  02/24/2023 0.99 (H) 0.60 - 0.95 mg/dL Final         Passed - Completed PHQ-2 or PHQ-9 in the last 360 days      Passed - Valid encounter within last 12 months    Recent Outpatient Visits           4 weeks ago Anxiety   [place] [facility], Netta Neat, DO   2 months ago Benign hypertension with CKD (chronic kidney disease) stage III [facility])   [place] [facility], Netta Neat, DO   2 months ago Epigastric abdominal pain   [place] [facility] [place], Netta Neat, DO   8 months ago Benign hypertension with CKD (chronic kidney disease), stage II   [place] [facility] [place], Netta Neat, DO   10 months ago Diarrhea, unspecified type   [place] [facility] [place], Netta Neat, DO       Future Appointments             In 3 months Althea Charon, Netta Neat, DO [place] [facility], [facility]

## 2023-05-16 ENCOUNTER — Encounter: Payer: Self-pay | Admitting: Physician Assistant

## 2023-05-16 ENCOUNTER — Ambulatory Visit: Payer: Medicare Other | Admitting: Physician Assistant

## 2023-05-16 VITALS — BP 145/72 | HR 70 | Temp 97.9°F | Ht 64.0 in

## 2023-05-16 DIAGNOSIS — K2970 Gastritis, unspecified, without bleeding: Secondary | ICD-10-CM | POA: Diagnosis not present

## 2023-05-16 DIAGNOSIS — K219 Gastro-esophageal reflux disease without esophagitis: Secondary | ICD-10-CM | POA: Diagnosis not present

## 2023-05-16 DIAGNOSIS — R140 Abdominal distension (gaseous): Secondary | ICD-10-CM

## 2023-05-16 DIAGNOSIS — K8020 Calculus of gallbladder without cholecystitis without obstruction: Secondary | ICD-10-CM | POA: Diagnosis not present

## 2023-05-16 DIAGNOSIS — K2951 Unspecified chronic gastritis with bleeding: Secondary | ICD-10-CM

## 2023-05-16 DIAGNOSIS — R1013 Epigastric pain: Secondary | ICD-10-CM

## 2023-05-16 NOTE — Progress Notes (Signed)
Celso Amy, PA-C [address]  [address]  [place], [place] [address]  Main: [phone]  Fax: [phone]   Gastroenterology Consultation  Referring Provider:     Saralyn Pilar * Primary Care Physician:  Smitty Cords, DO Primary Gastroenterologist:  Celso Amy, PA-C  Reason for Consultation:     Epigastric Abdominal pain        HPI:   Candace Tapia is a 87 y.o. y/o female referred for consultation & management  by Smitty Cords, DO.  Here to evaluate chronic intermittent abdominal pain with nausea and bloating.  She is here today with her son Candace Tapia who helps with her care.  Patient is in a wheelchair.  Patient has dyspepsia and epigastric discomfort with abdominal bloating after eating intermittently for many years.  She has mild associated nausea but no vomiting.  Was recently started on Nexium 20 mg once daily and dicyclomine 10 Mg 3 times daily as needed which helps.  Her GI symptoms have greatly improved in the past few months on these medications.  Currently she is not having any more abdominal pain.  She denies diarrhea, constipation, melena, hematochezia, or weight loss.  02/24/2023 labs: Normal CBC with hemoglobin 12.9, WBC 9.8, platelet 298.  Normal LFTs.  Normal TSH.  Mild CKD with BUN 16, creatinine 0.99, GFR 56.  08/2021: CT abdomen pelvis with contrast: To evaluate epigastric pain and nausea: No acute abnormality.  Hepatic steatosis.  Cholelithiasis.  Small stones in the gallbladder.  No biliary dilatation.  Pancreatic atrophy.  Diverticulosis.  Aortic atherosclerosis.  Benign kidney cysts.  Hysterectomy.  Previously saw Dr. Mechele Collin in 2008.  I cannot find any EGD or colonoscopy reports.  Medical history significant for moderate COPD on oxygen, and chronic pain.  Takes hydrocodone twice daily.  Past Medical History:  Diagnosis Date   Allergy    Arthritis    Breast cancer (HCC) 2014   radiation and taking exemestane   Cancer  of left breast (HCC) 10/27/2014   GERD (gastroesophageal reflux disease)    H/O: hysterectomy    History of lumpectomy 2014   LT breast   Spinal stenosis of lumbar region 03/08/2023    Past Surgical History:  Procedure Laterality Date   ABDOMINAL HYSTERECTOMY     BREAST LUMPECTOMY Left 2014   BREAST SURGERY Left 2014   JOINT REPLACEMENT     LYMPH NODE DISSECTION     neck   TOTAL SHOULDER REPLACEMENT Bilateral 2010 and 2013    Prior to Admission medications   Medication Sig Start Date End Date Taking? Authorizing Provider  acetaminophen (TYLENOL) 650 MG CR tablet Take 650 mg by mouth every 8 (eight) hours as needed for pain.    [provider]  albuterol (VENTOLIN HFA) 108 (90 Base) MCG/ACT inhaler Inhale 1 puff into the lungs every 6 (six) hours as needed.     [provider]  amLODipine (NORVASC) 5 MG tablet TAKE 1 TABLET(5 MG) BY MOUTH DAILY 01/10/23   Althea Charon, Netta Neat, DO  aspirin 81 MG chewable tablet Chew 81 mg by mouth daily as needed.    [provider]  chlorthalidone (HYGROTON) 25 MG tablet Take 1 tablet (25 mg total) by mouth daily. 03/07/23   Karamalegos, Netta Neat, DO  Cholecalciferol 25 MCG (1000 UT) capsule Vitamin D3 25 mcg (1,000 unit) capsule  1 po qd    [provider]  dicyclomine (BENTYL) 10 MG capsule Take 1 capsule (10 mg total) by  mouth 3 (three) times daily before meals. Take as needed for abdominal bloating / cramping symptoms 02/24/23   Althea Charon, Netta Neat, DO  escitalopram (LEXAPRO) 10 MG tablet Take 1 tablet (10 mg total) by mouth daily. 04/17/23   Karamalegos, Netta Neat, DO  famotidine (PEPCID) 20 MG tablet Take 1 tablet (20 mg total) by mouth 2 (two) times daily. 06/17/22   Sharman Cheek, MD  fluticasone [facility]) 50 MCG/ACT nasal spray Place 1 spray into both nostrils as needed.     [provider]  gabapentin (NEURONTIN) 400 MG capsule TAKE 1 CAPSULE(400 MG) BY MOUTH TWICE DAILY 05/15/23    Althea Charon, Netta Neat, DO  HYDROcodone-acetaminophen (NORCO/VICODIN) 5-325 MG tablet Take 1 tablet by mouth 2 (two) times daily as needed.    [provider]  losartan (COZAAR) 100 MG tablet Take 1 tablet (100 mg total) by mouth daily. 03/07/23   Smitty Cords, DO  magic mouthwash w/lidocaine SOLN Swish, gargle, and spit one to two teaspoonfuls every six hours as needed. Shake well before using. 04/17/23   Karamalegos, Netta Neat, DO  meclizine (ANTIVERT) 25 MG tablet Take 0.5 tablets (12.5 mg total) by mouth every 8 (eight) hours as needed for dizziness. 02/23/22   Karamalegos, Netta Neat, DO  meloxicam (MOBIC) 7.5 MG tablet Take 7.5 mg by mouth 3 (three) times daily.    [provider]  Multiple Vitamins-Minerals (MULTIVITAMIN WOMEN 50+) TABS Take 1 tablet by mouth daily.     [provider]  NEXIUM 20 MG capsule Take 1 capsule (20 mg total) by mouth daily. 07/09/15   Janeann Forehand., MD  ondansetron (ZOFRAN-ODT) 4 MG disintegrating tablet Take 1 tablet (4 mg total) by mouth every 8 (eight) hours as needed for nausea or vomiting. 02/24/23   Karamalegos, Netta Neat, DO  potassium chloride (MICRO-K) 10 MEQ CR capsule TAKE 1 CAPSULE(10 MEQ) BY MOUTH DAILY. MAY OPEN CAPSULE SPRINKLE ON SOFT FOOD OR. SWALLOW WHOLE 02/06/23   Karamalegos, Netta Neat, DO  Probiotic Product (PROBIOTIC DAILY PO) Take by mouth.    [provider]  Spacer/Aero-Holding Chambers (AEROCHAMBER PLUS) inhaler Use as instructed 08/18/19   Domenick Gong, MD  fexofenadine [facility] ALLERGY) 180 MG tablet Take 180 mg by mouth as needed. Reported on 09/29/2015 01/29/14 08/18/19  [provider]    Family History  Problem Relation Age of Onset   Breast cancer Mother 2   Diabetes Mother    Kidney disease Father    Diabetes Father    Breast cancer Sister 58     Social History   Tobacco Use   Smoking status: Former    Current packs/day: 0.00    Average packs/day:  2.0 packs/day for 40.0 years (80.0 ttl pk-yrs)    Types: Cigarettes    Start date: 04/19/1953    Quit date: 04/19/1993    Years since quitting: 30.0   Smokeless tobacco: Never  Substance Use Topics   Alcohol use: No    Alcohol/week: 0.0 standard drinks of alcohol   Drug use: No    Allergies as of 05/16/2023 - Review Complete 05/16/2023  Allergen Reaction Noted   Tramadol hcl Itching 09/29/2015   Statins  03/03/2015   Penicillin g Rash 10/27/2014   Penicillins Rash 10/27/2014   Sulfa antibiotics Other (See Comments) and Rash 10/27/2014    Review of Systems:    All systems reviewed and negative except where noted in HPI.   Physical Exam:  BP (!) 145/72   Pulse 70  Temp 97.9 F (36.6 C)   Ht 5\' 4"  (1.626 m)   BMI 35.87 kg/m  No LMP recorded. Patient has had a hysterectomy.  General:   Alert,  elderly female, wheelchair dependent; pleasant and cooperative in NAD Lungs:  Respirations even and unlabored.  Clear throughout to auscultation.   No wheezes, crackles, or rhonchi. No acute distress. Heart:  Regular rate and rhythm; no murmurs, clicks, rubs, or gallops. Abdomen:  Normal bowel sounds.  No bruits.  Soft, and obese without masses, hepatosplenomegaly or hernias noted.  No Tenderness.  No guarding or rebound tenderness.    Neurologic:  Alert and oriented x3;  grossly normal neurologically. Psych:  Alert and cooperative. Normal mood and affect.  Imaging Studies: No results found.  Assessment and Plan:   LANDRIE BEALE is a 88 y.o. y/o female has been referred for chronic intermittent mild abdominal pain with bloating.  Symptoms have currently greatly improved on dicyclomine and Nexium.  Symptoms are most consistent with gastritis and IBS.  We discussed ordering more tests for further evaluation, however patient and her son declined as she is currently asymptomatic.  I agree.  Abdominal pelvic CT and labs in the past 2 years have been unrevealing.  She did have small  gallstones however she does not have any RUQ pain or symptoms of gallstones.  1.  GERD 2.  Gastritis 3.  Probable irritable bowel syndrome with intermittent bloating and abdominal cramping 4.  Cholelithiasis, asymptomatic  Plan: -Continue current medications. -Continue dicyclomine 10 Mg 3 times daily sparingly as needed.  We discussed anticholinergic side effects of dicyclomine.  Recommend lowest effective dose necessary. -Continue Nexium 20 mg once daily and Pepcid 20 Mg twice daily. -If she has recurrent abdominal pain or worsening GI symptoms, next steps include H. pylori test, upper GI series, RUQ ultrasound, repeat labs (CBC, CMP, lipase).  Also consider adding sucralfate. -I do not recommend EGD or colonoscopy due to advanced age and increased risk of procedures. -Patient and her son agree with these recommendations.  They declined any further testing at this time.  Follow up if she has any recurrent GI symptoms.  Celso Amy, PA-C

## 2023-05-29 ENCOUNTER — Encounter: Payer: Self-pay | Admitting: Hematology and Oncology

## 2023-06-05 ENCOUNTER — Encounter: Payer: Self-pay | Admitting: Podiatry

## 2023-06-05 ENCOUNTER — Ambulatory Visit: Payer: Medicare Other | Admitting: Podiatry

## 2023-06-05 DIAGNOSIS — L923 Foreign body granuloma of the skin and subcutaneous tissue: Secondary | ICD-10-CM | POA: Diagnosis not present

## 2023-06-05 NOTE — Progress Notes (Signed)
  Subjective:  Patient ID: Candace Tapia, female    DOB: 08-Jan-1936,  MRN: [id number]  Chief Complaint  Patient presents with   Callouses    "I have this thing on the ball of my left foot.  It feels like I'm walking on a rock.  It hurts so bad."    88 y.o. female presents with the above complaint. History confirmed with patient.   Objective:  Physical Exam: warm, good capillary refill, no trophic changes or ulcerative lesions, normal DP and PT pulses, normal sensory exam, and left submetatarsal 2 callus area with hemorrhage deep to this and blistering.  No active signs of infection  Assessment:   1. Foreign body granuloma of skin and subcutaneous tissue      Plan:  Patient was evaluated and treated and all questions answered.  Appear to be consistent with a foreign body puncture wound.  I debrided the overlying skin and hyperkeratosis and there was a small puncture wound but no retained foreign body on examination.  This alleviated most of her pain.  I did apply salicylic acid to remove the remaining hyperkeratotic area.  Post care instructions given.  Return as needed if it is still painful in 3 to 4 weeks.  If she needs further care would recommend x-ray to evaluate further possible ultrasound  Return if symptoms worsen or fail to improve.

## 2023-06-05 NOTE — Patient Instructions (Signed)
Leave the Band-Aid on for 24 hours.  After 24 hours take it off and wash it with soap and water.  The skin will peel in the next few days to get rid of any remaining thick skin.  If you are still having pain in 3 to 4 weeks (mid March) please call me for another appointment.

## 2023-06-15 ENCOUNTER — Telehealth: Payer: Self-pay | Admitting: Family Medicine

## 2023-06-15 NOTE — Telephone Encounter (Signed)
 Copied from CRM [id number]. Topic: Medicare AWV >> Jun 15, 2023  1:06 PM Payton Doughty wrote: Reason for CRM: LVM to r/s AWV 08/04/23 appt due it was sched on Holiday and office is closed. Please call to r/s AWV  Verlee Rossetti; Care Guide Ambulatory Clinical Support [place] [facility] Direct Dial: [phone]

## 2023-06-23 ENCOUNTER — Observation Stay
Admission: EM | Admit: 2023-06-23 | Discharge: 2023-06-27 | Disposition: A | Discharge status: Home Health | Attending: Emergency Medicine | Admitting: Emergency Medicine

## 2023-06-23 ENCOUNTER — Emergency Department

## 2023-06-23 ENCOUNTER — Other Ambulatory Visit: Payer: Self-pay

## 2023-06-23 DIAGNOSIS — N8110 Cystocele, unspecified: Secondary | ICD-10-CM | POA: Diagnosis not present

## 2023-06-23 DIAGNOSIS — Z96612 Presence of left artificial shoulder joint: Secondary | ICD-10-CM | POA: Insufficient documentation

## 2023-06-23 DIAGNOSIS — K8020 Calculus of gallbladder without cholecystitis without obstruction: Secondary | ICD-10-CM | POA: Diagnosis not present

## 2023-06-23 DIAGNOSIS — R509 Fever, unspecified: Secondary | ICD-10-CM | POA: Diagnosis not present

## 2023-06-23 DIAGNOSIS — Z96611 Presence of right artificial shoulder joint: Secondary | ICD-10-CM | POA: Diagnosis not present

## 2023-06-23 DIAGNOSIS — A419 Sepsis, unspecified organism: Secondary | ICD-10-CM | POA: Diagnosis not present

## 2023-06-23 DIAGNOSIS — R932 Abnormal findings on diagnostic imaging of liver and biliary tract: Secondary | ICD-10-CM | POA: Diagnosis not present

## 2023-06-23 DIAGNOSIS — R112 Nausea with vomiting, unspecified: Secondary | ICD-10-CM | POA: Diagnosis not present

## 2023-06-23 DIAGNOSIS — E876 Hypokalemia: Secondary | ICD-10-CM | POA: Diagnosis not present

## 2023-06-23 DIAGNOSIS — N390 Urinary tract infection, site not specified: Secondary | ICD-10-CM

## 2023-06-23 DIAGNOSIS — G629 Polyneuropathy, unspecified: Secondary | ICD-10-CM

## 2023-06-23 DIAGNOSIS — Z79899 Other long term (current) drug therapy: Secondary | ICD-10-CM | POA: Insufficient documentation

## 2023-06-23 DIAGNOSIS — K529 Noninfective gastroenteritis and colitis, unspecified: Principal | ICD-10-CM

## 2023-06-23 DIAGNOSIS — R1084 Generalized abdominal pain: Secondary | ICD-10-CM

## 2023-06-23 DIAGNOSIS — I10 Essential (primary) hypertension: Secondary | ICD-10-CM | POA: Diagnosis not present

## 2023-06-23 DIAGNOSIS — K219 Gastro-esophageal reflux disease without esophagitis: Secondary | ICD-10-CM

## 2023-06-23 DIAGNOSIS — A4150 Gram-negative sepsis, unspecified: Secondary | ICD-10-CM | POA: Diagnosis not present

## 2023-06-23 DIAGNOSIS — K5730 Diverticulosis of large intestine without perforation or abscess without bleeding: Secondary | ICD-10-CM | POA: Diagnosis not present

## 2023-06-23 DIAGNOSIS — J9611 Chronic respiratory failure with hypoxia: Secondary | ICD-10-CM | POA: Diagnosis not present

## 2023-06-23 DIAGNOSIS — K7689 Other specified diseases of liver: Secondary | ICD-10-CM | POA: Diagnosis not present

## 2023-06-23 DIAGNOSIS — R531 Weakness: Secondary | ICD-10-CM | POA: Insufficient documentation

## 2023-06-23 DIAGNOSIS — A4159 Other Gram-negative sepsis: Secondary | ICD-10-CM | POA: Diagnosis not present

## 2023-06-23 DIAGNOSIS — K760 Fatty (change of) liver, not elsewhere classified: Secondary | ICD-10-CM | POA: Diagnosis not present

## 2023-06-23 DIAGNOSIS — J449 Chronic obstructive pulmonary disease, unspecified: Secondary | ICD-10-CM | POA: Insufficient documentation

## 2023-06-23 DIAGNOSIS — R231 Pallor: Secondary | ICD-10-CM | POA: Diagnosis not present

## 2023-06-23 DIAGNOSIS — R109 Unspecified abdominal pain: Secondary | ICD-10-CM | POA: Diagnosis not present

## 2023-06-23 DIAGNOSIS — I447 Left bundle-branch block, unspecified: Secondary | ICD-10-CM | POA: Diagnosis not present

## 2023-06-23 DIAGNOSIS — R0689 Other abnormalities of breathing: Secondary | ICD-10-CM | POA: Diagnosis not present

## 2023-06-23 DIAGNOSIS — Z853 Personal history of malignant neoplasm of breast: Secondary | ICD-10-CM | POA: Diagnosis not present

## 2023-06-23 DIAGNOSIS — R6889 Other general symptoms and signs: Secondary | ICD-10-CM | POA: Diagnosis not present

## 2023-06-23 DIAGNOSIS — K8689 Other specified diseases of pancreas: Secondary | ICD-10-CM | POA: Diagnosis not present

## 2023-06-23 DIAGNOSIS — Z7982 Long term (current) use of aspirin: Secondary | ICD-10-CM | POA: Diagnosis not present

## 2023-06-23 DIAGNOSIS — Z87891 Personal history of nicotine dependence: Secondary | ICD-10-CM | POA: Insufficient documentation

## 2023-06-23 LAB — CBC WITH DIFFERENTIAL/PLATELET
Abs Immature Granulocytes: 0.07 10*3/uL (ref 0.00–0.07)
Basophils Absolute: 0.1 10*3/uL (ref 0.0–0.1)
Basophils Relative: 0 %
Eosinophils Absolute: 0.1 10*3/uL (ref 0.0–0.5)
Eosinophils Relative: 1 %
HCT: 48.2 % — ABNORMAL HIGH (ref 36.0–46.0)
Hemoglobin: 15.1 g/dL — ABNORMAL HIGH (ref 12.0–15.0)
Immature Granulocytes: 0 %
Lymphocytes Relative: 6 %
Lymphs Abs: 0.9 10*3/uL (ref 0.7–4.0)
MCH: 28.8 pg (ref 26.0–34.0)
MCHC: 31.3 g/dL (ref 30.0–36.0)
MCV: 92 fL (ref 80.0–100.0)
Monocytes Absolute: 0.8 10*3/uL (ref 0.1–1.0)
Monocytes Relative: 5 %
Neutro Abs: 14.9 10*3/uL — ABNORMAL HIGH (ref 1.7–7.7)
Neutrophils Relative %: 88 %
Platelets: 228 10*3/uL (ref 150–400)
RBC: 5.24 MIL/uL — ABNORMAL HIGH (ref 3.87–5.11)
RDW: 14.6 % (ref 11.5–15.5)
WBC: 16.9 10*3/uL — ABNORMAL HIGH (ref 4.0–10.5)
nRBC: 0 % (ref 0.0–0.2)

## 2023-06-23 LAB — COMPREHENSIVE METABOLIC PANEL
ALT: 9 U/L (ref 0–44)
AST: 22 U/L (ref 15–41)
Albumin: 4.1 g/dL (ref 3.5–5.0)
Alkaline Phosphatase: 65 U/L (ref 38–126)
Anion gap: 16 — ABNORMAL HIGH (ref 5–15)
BUN: 22 mg/dL (ref 8–23)
CO2: 20 mmol/L — ABNORMAL LOW (ref 22–32)
Calcium: 9.3 mg/dL (ref 8.9–10.3)
Chloride: 101 mmol/L (ref 98–111)
Creatinine, Ser: 1.23 mg/dL — ABNORMAL HIGH (ref 0.44–1.00)
GFR, Estimated: 43 mL/min — ABNORMAL LOW (ref >60)
Glucose, Bld: 181 mg/dL — ABNORMAL HIGH (ref 70–99)
Potassium: 3.6 mmol/L (ref 3.5–5.1)
Sodium: 137 mmol/L (ref 135–145)
Total Bilirubin: 0.8 mg/dL (ref 0.0–1.2)
Total Protein: 7.4 g/dL (ref 6.5–8.1)

## 2023-06-23 LAB — PROTIME-INR
INR: 1 (ref 0.8–1.2)
Prothrombin Time: 13.3 s (ref 11.4–15.2)

## 2023-06-23 LAB — LACTIC ACID, PLASMA
Lactic Acid, Venous: 2.8 mmol/L (ref 0.5–1.9)
Lactic Acid, Venous: 3.6 mmol/L (ref 0.5–1.9)

## 2023-06-23 LAB — APTT: aPTT: 24 s (ref 24–36)

## 2023-06-23 MED ORDER — SODIUM CHLORIDE 0.9 % IV BOLUS
500.0000 mL | Freq: Once | INTRAVENOUS | Status: AC
  Administered 2023-06-23: 500 mL via INTRAVENOUS

## 2023-06-23 MED ORDER — IOHEXOL 300 MG/ML  SOLN
80.0000 mL | Freq: Once | INTRAMUSCULAR | Status: AC | PRN
  Administered 2023-06-23: 80 mL via INTRAVENOUS

## 2023-06-23 MED ORDER — SODIUM CHLORIDE 0.9 % IV BOLUS
1000.0000 mL | Freq: Once | INTRAVENOUS | Status: AC
  Administered 2023-06-23: 1000 mL via INTRAVENOUS

## 2023-06-23 MED ORDER — SODIUM CHLORIDE 0.9 % IV SOLN
2.0000 g | Freq: Once | INTRAVENOUS | Status: AC
  Administered 2023-06-23: 2 g via INTRAVENOUS
  Filled 2023-06-23: qty 10

## 2023-06-23 MED ORDER — ONDANSETRON HCL 4 MG/2ML IJ SOLN
4.0000 mg | INTRAMUSCULAR | Status: AC
  Administered 2023-06-23: 4 mg via INTRAVENOUS
  Filled 2023-06-23: qty 2

## 2023-06-23 MED ORDER — SODIUM CHLORIDE 0.9 % IV BOLUS (SEPSIS): 1000.0000 mL | Freq: Once | INTRAVENOUS | Status: DC

## 2023-06-23 MED ORDER — VANCOMYCIN HCL IN DEXTROSE 1-5 GM/200ML-% IV SOLN: 1000.0000 mg | Freq: Once | INTRAVENOUS | Status: DC

## 2023-06-23 MED ORDER — SODIUM CHLORIDE 0.9 % IV BOLUS (SEPSIS)
800.0000 mL | Freq: Once | INTRAVENOUS | Status: AC
  Administered 2023-06-24: 800 mL via INTRAVENOUS

## 2023-06-23 MED ORDER — METRONIDAZOLE 500 MG/100ML IV SOLN: 500.0000 mg | Freq: Once | INTRAVENOUS | Status: DC

## 2023-06-23 NOTE — Progress Notes (Addendum)
 CODE SEPSIS - PHARMACY COMMUNICATION  **Broad Spectrum Antibiotics should be administered within 1 hour of Sepsis diagnosis**  Time Code Sepsis Called/Page Received: 2353  Antibiotics Ordered: Aztreonam, Flagyl, Vancomycin  Time of 1st antibiotic administration: 2357  Otelia Sergeant, PharmD, [facility] 06/23/2023 11:58 PM

## 2023-06-23 NOTE — ED Provider Notes (Signed)
-----------------------------------------   11:48 PM on 06/23/2023 -----------------------------------------   Care assumed from Dr. Fanny Bien.  In summary, this is a 88 year old female presenting with nausea, vomiting and diarrhea.  Ultrasound and CT scan grossly unremarkable.  Laboratory results demonstrate leukocytosis with uptrending lactic acid.  Will trigger ED sepsis protocol, initiate 30 cc/kilo IV fluids, broad-spectrum IV antibiotics.  Have consulted hospital services for evaluation and admission.   Irean Hong, MD 06/24/23 [phone]

## 2023-06-23 NOTE — ED Notes (Signed)
 Attempted IV access x 2 without success. Britt Boozer, RN to bedside to attempt

## 2023-06-23 NOTE — ED Provider Notes (Signed)
 [facility] Provider Note    Event Date/Time   First MD Initiated Contact with Patient 06/23/23 1910     (approximate)   History   Abdominal Pain and Diarrhea   HPI  Candace Tapia is a 88 y.o. female has been having diarrhea and upper abdominal pain since last night.  When it happens she reports this happens over and over again she has recurrences of nausea vomiting diarrhea.  Has previously seen specialist including GI for same.  No fever.  No urinary symptoms or changes.  Reports she will have episodes like this off and on   Patient was seen by gastroenterology January 28 at that time they advised 'Symptoms are most consistent with gastritis and IBS.'  Consideration for right upper quadrant ultrasound if symptoms persist  Physical Exam   Triage Vital Signs: ED Triage Vitals  Encounter Vitals Group     BP 06/23/23 1917 132/73     Systolic BP Percentile --      Diastolic BP Percentile --      Pulse Rate 06/23/23 1917 92     Resp --      Temp --      Temp src --      SpO2 06/23/23 1913 95 %     Weight 06/23/23 1918 223 lb 6.4 oz (101.3 kg)     Height 06/23/23 1918 5\' 4"  (1.626 m)     Head Circumference --      Peak Flow --      Pain Score 06/23/23 1917 0     Pain Loc --      Pain Education --      Exclude from Growth Chart --     Most recent vital signs: Vitals:   06/27/23 0412 06/27/23 0739  BP: (!) 139/52 (!) 127/54  Pulse: 73 72  Resp: 18 17  Temp: 98.1 F (36.7 C) 97.9 F (36.6 C)  SpO2: (!) 87% 91%     General: Awake, no distress.  Does appear nauseated but not in acute distress CV:  Good peripheral perfusion.  Warm well-perfused extremities Resp:  Normal effort.  Abd:  No distention.  Moderate discomfort primarily across the mid to upper abdomen and some in the right upper quadrant Other:  Palpable pulses lower extremities bilateral   ED Results / Procedures / Treatments   Labs (all labs ordered are listed, but only  abnormal results are displayed) Labs Reviewed  GASTROINTESTINAL PANEL BY PCR, STOOL (REPLACES STOOL CULTURE) - Abnormal; Notable for the following components:      Result Value   Enteropathogenic E coli (EPEC) DETECTED (*)    All other components within normal limits  URINE CULTURE - Abnormal; Notable for the following components:   Culture >=100,000 COLONIES/mL ENTEROCOCCUS FAECALIS (*)    Organism ID, Bacteria ENTEROCOCCUS FAECALIS (*)    All other components within normal limits  LACTIC ACID, PLASMA - Abnormal; Notable for the following components:   Lactic Acid, Venous 2.8 (*)    All other components within normal limits  LACTIC ACID, PLASMA - Abnormal; Notable for the following components:   Lactic Acid, Venous 3.6 (*)    All other components within normal limits  COMPREHENSIVE METABOLIC PANEL - Abnormal; Notable for the following components:   CO2 20 (*)    Glucose, Bld 181 (*)    Creatinine, Ser 1.23 (*)    GFR, Estimated 43 (*)    Anion gap 16 (*)    All  other components within normal limits  CBC WITH DIFFERENTIAL/PLATELET - Abnormal; Notable for the following components:   WBC 16.9 (*)    RBC 5.24 (*)    Hemoglobin 15.1 (*)    HCT 48.2 (*)    Neutro Abs 14.9 (*)    All other components within normal limits  URINALYSIS, ROUTINE W REFLEX MICROSCOPIC - Abnormal; Notable for the following components:   Color, Urine YELLOW (*)    APPearance HAZY (*)    Specific Gravity, Urine >1.046 (*)    Hgb urine dipstick SMALL (*)    Nitrite POSITIVE (*)    Leukocytes,Ua MODERATE (*)    All other components within normal limits  BASIC METABOLIC PANEL - Abnormal; Notable for the following components:   Potassium 3.3 (*)    CO2 21 (*)    Glucose, Bld 102 (*)    Calcium 8.0 (*)    All other components within normal limits  CBC - Abnormal; Notable for the following components:   Hemoglobin 11.9 (*)    All other components within normal limits  CBC - Abnormal; Notable for the  following components:   Hemoglobin 11.2 (*)    HCT 34.7 (*)    All other components within normal limits  BASIC METABOLIC PANEL - Abnormal; Notable for the following components:   Calcium 8.4 (*)    All other components within normal limits  C DIFFICILE QUICK SCREEN W PCR REFLEX    CULTURE, BLOOD (ROUTINE X 2)  CULTURE, BLOOD (ROUTINE X 2)  MRSA NEXT GEN BY PCR, NASAL  APTT  PROTIME-INR  PROTIME-INR  CORTISOL-AM, BLOOD  LACTIC ACID, PLASMA  MAGNESIUM     EKG  Left bundle branch block.  No evidence of acute ischemia   RADIOLOGY  CT ABDOMEN PELVIS W CONTRAST Result Date: 06/23/2023 CLINICAL DATA:  Bowel obstruction, diarrhea, nausea EXAM: CT ABDOMEN AND PELVIS WITH CONTRAST TECHNIQUE: Multidetector CT imaging of the abdomen and pelvis was performed using the standard protocol following bolus administration of intravenous contrast. RADIATION DOSE REDUCTION: This exam was performed according to the departmental dose-optimization program which includes automated exposure control, adjustment of the mA and/or kV according to patient size and/or use of iterative reconstruction technique. CONTRAST:  80mL OMNIPAQUE IOHEXOL 300 MG/ML  SOLN COMPARISON:  08/26/2021 FINDINGS: Lower chest: Bibasilar cylindrical and cystic bronchiectasis with peripheral airway impaction seen within visualized lung bases, similar to prior examination. Mild superimposed peribronchial nodularity in keeping with bronchiolitis. No focal consolidation. Cardiac size within normal limits. Hepatobiliary: Cholelithiasis without pericholecystic inflammatory change noted. No enhancing intrahepatic mass. No intra or extrahepatic biliary ductal dilation. Pancreas: 2.4 x 2.1 cm cystic lesion has enlarged within the proximal body of the pancreas at axial image # 30/2, previously measuring 1.2 cm. This is in keeping with a dilated pancreatic side branch, intrapancreatic pseudocyst, or cystic neoplasm. Mild parenchymal atrophy of the  underlying pancreas. No superimposed peripancreatic inflammatory change. Spleen: Normal in size without focal abnormality. Adrenals/Urinary Tract: The adrenal glands are unremarkable. The kidneys are normal in size and position. Multiple simple cortical cysts are seen within kidneys bilaterally for which no follow-up imaging is recommended. The kidneys are otherwise unremarkable. Pelvic floor descent with a small cystocele noted. The bladder is otherwise unremarkable. Stomach/Bowel: Moderate descending and sigmoid diverticulosis. Stomach, small bowel, and large bowel are otherwise unremarkable. Appendix normal. No evidence of obstruction or focal inflammation. No free intraperitoneal gas or fluid. Vascular/Lymphatic: Aortic atherosclerosis. No enlarged abdominal or pelvic lymph nodes. Reproductive: Status post hysterectomy.  Mild middle compartment descent secondary to pelvic floor relaxation. 2.4 cm cystic lesion within the left ovary appears slightly decreased in size since prior examination where this measured 2.4 cm and is safely considered benign. Other: No abdominal wall hernia. Pelvic floor descent with mild posterior compartment prolapse. Musculoskeletal: Osseous structures are age-appropriate. No acute bone abnormality. No lytic or blastic lesion IMPRESSION: 1. No acute intra-abdominal pathology identified. No definite radiographic explanation for the patient's reported symptoms. 2. Bibasilar cylindrical and cystic bronchiectasis with peripheral airway impaction, similar to prior examination. Mild superimposed peribronchial nodularity in keeping with bronchiolitis. 3. Cholelithiasis. 4. Moderate descending and sigmoid diverticulosis without superimposed acute inflammatory change. 5. Pelvic floor descent with a small cystocele and mild middle and posterior compartment prolapse. 6. 2.4 cm cystic lesion within the proximal body of the pancreas, previously measuring 1.2 cm. This is in keeping with a dilated  pancreatic side branch, intrapancreatic pseudocyst, or cystic neoplasm. If indicated, this could be better assessed with dedicated contrast enhanced MRI examination. Aortic Atherosclerosis (ICD10-I70.0). Electronically Signed   By: Helyn Numbers M.D.   On: 06/23/2023 23:09   US ABDOMEN LIMITED RUQ (LIVER/GB) Result Date: 06/23/2023 CLINICAL DATA:  Unspecified abdominal pain EXAM: ULTRASOUND ABDOMEN LIMITED RIGHT UPPER QUADRANT COMPARISON:  None Available. FINDINGS: Gallbladder: No gallstones or wall thickening visualized. No sonographic Murphy sign noted by sonographer. Common bile duct: Diameter: 7 mm in proximal diameter Liver: Hepatic parenchymal echogenicity is diffusely increased in keeping with mild hepatic steatosis. No focal intrahepatic masses are seen and there is no intrahepatic biliary ductal dilation. portal vein is patent on color Doppler imaging with normal direction of blood flow towards the liver. Other: 1.6 x 1.9 x 2.4 cm simple cyst is seen subjacent to the left hepatic lobe which may represent an exophytic hepatic cyst, mesenteric cyst or pancreatic cyst/pseudocyst. IMPRESSION: 1. Mild hepatic steatosis. 2. 2.4 cm simple cyst subjacent to the left hepatic lobe which may represent an exophytic hepatic cyst, mesenteric cyst or pancreatic cyst/pseudocyst. This is better assessed on accompanying CT examination the abdomen pelvis. Electronically Signed   By: Helyn Numbers M.D.   On: 06/23/2023 22:58    Labs notable for elevated lactic acid at 2.8.  Leukocytosis white count of 17,000.  Hemoglobin 15.  There is left shift noted  PROCEDURES:  Critical Care performed: No  Procedures   MEDICATIONS ORDERED IN ED: Medications  ondansetron (ZOFRAN) injection 4 mg (4 mg Intravenous Given 06/23/23 2005)  sodium chloride 0.9 % bolus 500 mL (0 mLs Intravenous Stopped 06/23/23 2053)  iohexol (OMNIPAQUE) 300 MG/ML solution 80 mL (80 mLs Intravenous Contrast Given 06/23/23 2038)  sodium chloride 0.9 %  bolus 1,000 mL (0 mLs Intravenous Stopped 06/24/23 0027)  sodium chloride 0.9 % bolus 800 mL (800 mLs Intravenous New Bag/Given 06/24/23 0037)  aztreonam (AZACTAM) 2 g in sodium chloride 0.9 % 100 mL IVPB (0 g Intravenous Stopped 06/24/23 0027)  potassium chloride SA (KLOR-CON M) CR tablet 40 mEq (40 mEq Oral Given 06/24/23 1522)     IMPRESSION / MDM / ASSESSMENT AND PLAN / ED COURSE  I reviewed the triage vital signs and the nursing notes.                              Differential diagnosis includes, but is not limited to, possible recurrent episodes of cyclical vomiting, gastritis, mesenteric ischemia, cholelithiasis/cholecystitis, choledocholithiasis, colitis, diverticulitis etc.  Patient denies any associated fevers or chills.  Reports history of cyclical episodes of nausea and vomiting.  Patient's presentation is most consistent with acute complicated illness / injury requiring diagnostic workup.   Patient improving with ED interventions.  Symptoms improving, imaging does not show any obvious acute process in the right upper quadrant or CT.  Given the patient's elevating lactic acid, elevated white count suspicion for infection is high but source not yet identified.  Awaiting urinalysis.  Ongoing care and disposition assigned to Dr. Dolores Frame.  Anticipate likely need for admission       FINAL CLINICAL IMPRESSION(S) / ED DIAGNOSES   Final diagnoses:  Sepsis, due to unspecified organism, unspecified whether acute organ dysfunction present (HCC)  Nausea and vomiting, unspecified vomiting type  Generalized abdominal pain  Nausea vomiting and diarrhea     Rx / DC Orders   Note:  This document was prepared using Dragon voice recognition software and may include unintentional dictation errors.   Sharyn Creamer, MD 07/04/23 [phone]

## 2023-06-23 NOTE — ED Triage Notes (Signed)
 BIBA with c/o diarrhea. Offers that she has had nausea/vomiting/ abdominal pain x 1 month but diarrhea started a few hours ago.

## 2023-06-23 NOTE — ED Notes (Signed)
 La 2.8 Provider aware

## 2023-06-24 ENCOUNTER — Encounter: Payer: Self-pay | Admitting: Family Medicine

## 2023-06-24 DIAGNOSIS — Z79899 Other long term (current) drug therapy: Secondary | ICD-10-CM | POA: Diagnosis not present

## 2023-06-24 DIAGNOSIS — J449 Chronic obstructive pulmonary disease, unspecified: Secondary | ICD-10-CM | POA: Diagnosis not present

## 2023-06-24 DIAGNOSIS — R531 Weakness: Secondary | ICD-10-CM | POA: Diagnosis not present

## 2023-06-24 DIAGNOSIS — Z7982 Long term (current) use of aspirin: Secondary | ICD-10-CM | POA: Diagnosis not present

## 2023-06-24 DIAGNOSIS — K529 Noninfective gastroenteritis and colitis, unspecified: Secondary | ICD-10-CM | POA: Diagnosis not present

## 2023-06-24 DIAGNOSIS — K5730 Diverticulosis of large intestine without perforation or abscess without bleeding: Secondary | ICD-10-CM | POA: Diagnosis not present

## 2023-06-24 DIAGNOSIS — Z96612 Presence of left artificial shoulder joint: Secondary | ICD-10-CM | POA: Diagnosis not present

## 2023-06-24 DIAGNOSIS — I10 Essential (primary) hypertension: Secondary | ICD-10-CM | POA: Diagnosis not present

## 2023-06-24 DIAGNOSIS — Z96611 Presence of right artificial shoulder joint: Secondary | ICD-10-CM | POA: Diagnosis not present

## 2023-06-24 DIAGNOSIS — Z853 Personal history of malignant neoplasm of breast: Secondary | ICD-10-CM | POA: Diagnosis not present

## 2023-06-24 DIAGNOSIS — A4159 Other Gram-negative sepsis: Secondary | ICD-10-CM | POA: Diagnosis not present

## 2023-06-24 DIAGNOSIS — N390 Urinary tract infection, site not specified: Secondary | ICD-10-CM | POA: Diagnosis not present

## 2023-06-24 DIAGNOSIS — K219 Gastro-esophageal reflux disease without esophagitis: Secondary | ICD-10-CM | POA: Diagnosis not present

## 2023-06-24 DIAGNOSIS — J9611 Chronic respiratory failure with hypoxia: Secondary | ICD-10-CM | POA: Diagnosis not present

## 2023-06-24 DIAGNOSIS — A4150 Gram-negative sepsis, unspecified: Secondary | ICD-10-CM | POA: Diagnosis not present

## 2023-06-24 DIAGNOSIS — E876 Hypokalemia: Secondary | ICD-10-CM | POA: Diagnosis not present

## 2023-06-24 DIAGNOSIS — G629 Polyneuropathy, unspecified: Secondary | ICD-10-CM

## 2023-06-24 DIAGNOSIS — Z87891 Personal history of nicotine dependence: Secondary | ICD-10-CM | POA: Diagnosis not present

## 2023-06-24 LAB — URINALYSIS, ROUTINE W REFLEX MICROSCOPIC
Bacteria, UA: NONE SEEN
Bilirubin Urine: NEGATIVE
Glucose, UA: NEGATIVE mg/dL
Ketones, ur: NEGATIVE mg/dL
Nitrite: POSITIVE — AB
Protein, ur: NEGATIVE mg/dL
Specific Gravity, Urine: >1.046 — ABNORMAL HIGH (ref 1.005–1.030)
pH: 5 (ref 5.0–8.0)

## 2023-06-24 LAB — GASTROINTESTINAL PANEL BY PCR, STOOL (REPLACES STOOL CULTURE)

## 2023-06-24 LAB — MAGNESIUM: Magnesium: 1.8 mg/dL (ref 1.7–2.4)

## 2023-06-24 LAB — CBC
HCT: 36.9 % (ref 36.0–46.0)
Hemoglobin: 11.9 g/dL — ABNORMAL LOW (ref 12.0–15.0)
MCH: 29.5 pg (ref 26.0–34.0)
MCHC: 32.2 g/dL (ref 30.0–36.0)
MCV: 91.3 fL (ref 80.0–100.0)
Platelets: 215 10*3/uL (ref 150–400)
RBC: 4.04 MIL/uL (ref 3.87–5.11)
RDW: 14.7 % (ref 11.5–15.5)
WBC: 8.5 10*3/uL (ref 4.0–10.5)
nRBC: 0 % (ref 0.0–0.2)

## 2023-06-24 LAB — BASIC METABOLIC PANEL
Anion gap: 9 (ref 5–15)
BUN: 19 mg/dL (ref 8–23)
CO2: 21 mmol/L — ABNORMAL LOW (ref 22–32)
Calcium: 8 mg/dL — ABNORMAL LOW (ref 8.9–10.3)
Chloride: 108 mmol/L (ref 98–111)
Creatinine, Ser: 0.87 mg/dL (ref 0.44–1.00)
GFR, Estimated: >60 mL/min (ref >60)
Glucose, Bld: 102 mg/dL — ABNORMAL HIGH (ref 70–99)
Potassium: 3.3 mmol/L — ABNORMAL LOW (ref 3.5–5.1)
Sodium: 138 mmol/L (ref 135–145)

## 2023-06-24 LAB — C DIFFICILE QUICK SCREEN W PCR REFLEX
C Diff antigen: NEGATIVE
C Diff interpretation: NOT DETECTED
C Diff toxin: NEGATIVE

## 2023-06-24 LAB — CORTISOL-AM, BLOOD: Cortisol - AM: 17.1 ug/dL (ref 6.7–22.6)

## 2023-06-24 LAB — LACTIC ACID, PLASMA: Lactic Acid, Venous: 1.4 mmol/L (ref 0.5–1.9)

## 2023-06-24 LAB — PROTIME-INR
INR: 1 (ref 0.8–1.2)
Prothrombin Time: 13.5 s (ref 11.4–15.2)

## 2023-06-24 LAB — MRSA NEXT GEN BY PCR, NASAL: MRSA by PCR Next Gen: NOT DETECTED

## 2023-06-24 MED ORDER — ADULT MULTIVITAMIN W/MINERALS CH
1.0000 | ORAL_TABLET | Freq: Every day | ORAL | Status: DC
  Administered 2023-06-24 – 2023-06-27 (×4): 1 via ORAL
  Filled 2023-06-24 (×4): qty 1

## 2023-06-24 MED ORDER — METRONIDAZOLE 500 MG/100ML IV SOLN
500.0000 mg | Freq: Two times a day (BID) | INTRAVENOUS | Status: DC
  Administered 2023-06-24: 500 mg via INTRAVENOUS
  Filled 2023-06-24 (×2): qty 100

## 2023-06-24 MED ORDER — SODIUM CHLORIDE 0.9 % IV SOLN: 2.0000 g | Freq: Once | INTRAVENOUS | Status: DC

## 2023-06-24 MED ORDER — ESCITALOPRAM OXALATE 10 MG PO TABS
10.0000 mg | ORAL_TABLET | Freq: Every day | ORAL | Status: DC
  Administered 2023-06-24 – 2023-06-27 (×4): 10 mg via ORAL
  Filled 2023-06-24 (×4): qty 1

## 2023-06-24 MED ORDER — POTASSIUM CHLORIDE CRYS ER 20 MEQ PO TBCR
40.0000 meq | EXTENDED_RELEASE_TABLET | Freq: Once | ORAL | Status: AC
  Administered 2023-06-24: 40 meq via ORAL
  Filled 2023-06-24: qty 2

## 2023-06-24 MED ORDER — ENOXAPARIN SODIUM 60 MG/0.6ML IJ SOSY
50.0000 mg | PREFILLED_SYRINGE | INTRAMUSCULAR | Status: DC
  Administered 2023-06-24 – 2023-06-27 (×4): 50 mg via SUBCUTANEOUS
  Filled 2023-06-24 (×4): qty 0.6

## 2023-06-24 MED ORDER — MAGNESIUM HYDROXIDE 400 MG/5ML PO SUSP: 30.0000 mL | Freq: Every day | ORAL | Status: DC | PRN

## 2023-06-24 MED ORDER — ONDANSETRON HCL 4 MG PO TABS: 4.0000 mg | ORAL_TABLET | Freq: Four times a day (QID) | ORAL | Status: DC | PRN

## 2023-06-24 MED ORDER — HYDROCODONE-ACETAMINOPHEN 5-325 MG PO TABS
1.0000 | ORAL_TABLET | Freq: Two times a day (BID) | ORAL | Status: DC | PRN
  Administered 2023-06-24: 1 via ORAL
  Filled 2023-06-24: qty 1

## 2023-06-24 MED ORDER — AMLODIPINE BESYLATE 5 MG PO TABS
5.0000 mg | ORAL_TABLET | Freq: Every day | ORAL | Status: DC
  Administered 2023-06-24 – 2023-06-27 (×4): 5 mg via ORAL
  Filled 2023-06-24 (×4): qty 1

## 2023-06-24 MED ORDER — VANCOMYCIN HCL 2000 MG/400ML IV SOLN
2000.0000 mg | Freq: Once | INTRAVENOUS | Status: DC
  Administered 2023-06-24: 2000 mg via INTRAVENOUS
  Filled 2023-06-24 (×2): qty 400

## 2023-06-24 MED ORDER — LACTATED RINGERS IV SOLN
150.0000 mL/h | INTRAVENOUS | Status: DC
  Administered 2023-06-24: 150 mL/h via INTRAVENOUS

## 2023-06-24 MED ORDER — ACETAMINOPHEN 650 MG RE SUPP: 650.0000 mg | Freq: Four times a day (QID) | RECTAL | Status: DC | PRN

## 2023-06-24 MED ORDER — ONDANSETRON HCL 4 MG/2ML IJ SOLN
4.0000 mg | Freq: Four times a day (QID) | INTRAMUSCULAR | Status: DC | PRN
  Administered 2023-06-26: 4 mg via INTRAVENOUS
  Filled 2023-06-24: qty 2

## 2023-06-24 MED ORDER — VANCOMYCIN HCL IN DEXTROSE 1-5 GM/200ML-% IV SOLN: 1000.0000 mg | Freq: Once | INTRAVENOUS | Status: DC

## 2023-06-24 MED ORDER — CHLORTHALIDONE 25 MG PO TABS
25.0000 mg | ORAL_TABLET | Freq: Every day | ORAL | Status: DC
  Administered 2023-06-24 – 2023-06-27 (×4): 25 mg via ORAL
  Filled 2023-06-24 (×4): qty 1

## 2023-06-24 MED ORDER — DICYCLOMINE HCL 10 MG PO CAPS: 10.0000 mg | ORAL_CAPSULE | Freq: Three times a day (TID) | ORAL | Status: DC | PRN

## 2023-06-24 MED ORDER — GABAPENTIN 300 MG PO CAPS
400.0000 mg | ORAL_CAPSULE | Freq: Two times a day (BID) | ORAL | Status: DC
  Administered 2023-06-24 – 2023-06-27 (×6): 400 mg via ORAL
  Filled 2023-06-24 (×6): qty 1

## 2023-06-24 MED ORDER — TRAZODONE HCL 50 MG PO TABS: 25.0000 mg | ORAL_TABLET | Freq: Every evening | ORAL | Status: DC | PRN

## 2023-06-24 MED ORDER — VANCOMYCIN HCL 750 MG/150ML IV SOLN
750.0000 mg | INTRAVENOUS | Status: DC
  Administered 2023-06-25: 750 mg via INTRAVENOUS
  Filled 2023-06-24: qty 150

## 2023-06-24 MED ORDER — ACETAMINOPHEN 325 MG PO TABS: 650.0000 mg | ORAL_TABLET | Freq: Four times a day (QID) | ORAL | Status: DC | PRN

## 2023-06-24 MED ORDER — ALBUTEROL SULFATE (2.5 MG/3ML) 0.083% IN NEBU: 2.5000 mg | INHALATION_SOLUTION | Freq: Four times a day (QID) | RESPIRATORY_TRACT | Status: DC | PRN

## 2023-06-24 MED ORDER — SODIUM CHLORIDE 0.9 % IV SOLN
2.0000 g | Freq: Two times a day (BID) | INTRAVENOUS | Status: DC
  Administered 2023-06-24 – 2023-06-25 (×3): 2 g via INTRAVENOUS
  Filled 2023-06-24 (×3): qty 12.5

## 2023-06-24 MED ORDER — LOSARTAN POTASSIUM 50 MG PO TABS
100.0000 mg | ORAL_TABLET | Freq: Every day | ORAL | Status: DC
  Administered 2023-06-24 – 2023-06-27 (×4): 100 mg via ORAL
  Filled 2023-06-24 (×4): qty 2

## 2023-06-24 MED ORDER — POTASSIUM CHLORIDE CRYS ER 10 MEQ PO TBCR
10.0000 meq | EXTENDED_RELEASE_TABLET | Freq: Every day | ORAL | Status: DC
  Administered 2023-06-24: 10 meq via ORAL
  Filled 2023-06-24: qty 1

## 2023-06-24 MED ORDER — ALBUTEROL SULFATE HFA 108 (90 BASE) MCG/ACT IN AERS: 1.0000 | INHALATION_SPRAY | Freq: Four times a day (QID) | RESPIRATORY_TRACT | Status: DC | PRN

## 2023-06-24 MED ORDER — FLUTICASONE PROPIONATE 50 MCG/ACT NA SUSP: 1.0000 | NASAL | Status: DC | PRN

## 2023-06-24 NOTE — Sepsis Progress Note (Signed)
Elink monitoring for the code sepsis protocol.   Notified provider of need to order repeat lactic acid.

## 2023-06-24 NOTE — Progress Notes (Signed)
 Telephone call to Infection Control [phone], spoke with Valentino Saxon; advised her that the pt was negative for cdiff both antigen and toxin; positive for enteropathogenic e. Coli; no isolation required; advised Dr Myriam Forehand of said conversation

## 2023-06-24 NOTE — Progress Notes (Signed)
 Progress Note    Candace Tapia  ZOX:[id number] DOB: Jun 13, 1935  DOA: 06/23/2023 PCP: Smitty Cords, DO      Brief Narrative:    Medical records reviewed and are as summarized below:  Candace Tapia is a 88 y.o. female with medical history significant for osteoarthritis, GERD, breast cancer status post lumpectomy, radiotherapy and chemotherapy, who presented to the emergency room with acute onset of recurrent nausea, vomiting and diarrhea.  She has been having recurrent nausea and abdominal pain for over a month.  She had seen the gastroenterologist on 05/16/2023 and symptoms were attributed to gastritis, GERD and possible IBS with intermittent bloating and cramping.  She had vomiting and diarrhea in the last few days. WBC was 16.9, lactic acid was 2.8 and 1 up to 3.6.  Urinalysis was positive for UTI.      Assessment/Plan:   Principal Problem:   Sepsis due to gram-negative UTI [facility]) Active Problems:   Acute gastroenteritis   Essential hypertension   Peripheral neuropathy   GERD without esophagitis    Body mass index is 36.78 kg/m.   Sepsis secondary to acute UTI, acute gastroenteritis: Discontinue IV vancomycin.  Change IV cefepime to IV ceftriaxone.  Follow-up urine cultures.  History of Klebsiella pneumoniae UTI in January 2019 that was sensitive to ceftriaxone.   Stool for GI panel was positive for enteropathogenic E. coli. Stool for C. difficile toxin was negative. Advance diet from clear liquid to regular diet.   Hypertension: Continue antihypertensives   COPD, chronic hypoxic respiratory failure: Continue bronchodilators.  She uses 2 L/min oxygen at night at baseline   Comorbidities include GERD, peripheral neuropathy        Diet Order             Diet regular Room service appropriate? Yes; Fluid consistency: Thin  Diet effective now                             Consultants: None  Procedures: None    Medications:    amLODipine  5 mg Oral Daily   chlorthalidone  25 mg Oral Daily   enoxaparin (LOVENOX) injection  50 mg Subcutaneous Q24H   escitalopram  10 mg Oral Daily   gabapentin  400 mg Oral BID   losartan  100 mg Oral Daily   multivitamin with minerals  1 tablet Oral Daily   potassium chloride  40 mEq Oral Once   Continuous Infusions:  ceFEPime (MAXIPIME) IV Stopped (06/24/23 0910)   [START ON 06/25/2023] vancomycin       Anti-infectives (From admission, onward)    Start     Dose/Rate Route Frequency Ordered Stop   06/25/23 0300  vancomycin (VANCOREADY) IVPB 750 mg/150 mL        750 mg 150 mL/hr over 60 Minutes Intravenous Every 24 hours 06/24/23 0043 07/01/23 0259   06/24/23 0800  ceFEPIme (MAXIPIME) 2 g in sodium chloride 0.9 % 100 mL IVPB        2 g 200 mL/hr over 30 Minutes Intravenous Every 12 hours 06/24/23 0035 07/01/23 0759   06/24/23 0030  aztreonam (AZACTAM) 2 g in sodium chloride 0.9 % 100 mL IVPB  Status:  Discontinued        2 g 200 mL/hr over 30 Minutes Intravenous  Once 06/24/23 0019 06/24/23 0023   06/24/23 0030  metroNIDAZOLE (FLAGYL) IVPB 500 mg  Status:  Discontinued  500 mg 100 mL/hr over 60 Minutes Intravenous Every 12 hours 06/24/23 0019 06/24/23 0453   06/24/23 0030  vancomycin (VANCOCIN) IVPB 1000 mg/200 mL premix  Status:  Discontinued        1,000 mg 200 mL/hr over 60 Minutes Intravenous  Once 06/24/23 0019 06/24/23 0023   06/24/23 0030  vancomycin (VANCOREADY) IVPB 2000 mg/400 mL  Status:  Discontinued        2,000 mg 200 mL/hr over 120 Minutes Intravenous  Once 06/24/23 0022 06/24/23 0519   06/24/23 0000  aztreonam (AZACTAM) 2 g in sodium chloride 0.9 % 100 mL IVPB        2 g 200 mL/hr over 30 Minutes Intravenous  Once 06/23/23 2348 06/24/23 0027   06/24/23 0000  metroNIDAZOLE (FLAGYL) IVPB 500 mg  Status:  Discontinued        500 mg 100 mL/hr over 60 Minutes  Intravenous  Once 06/23/23 2348 06/24/23 0023   06/24/23 0000  vancomycin (VANCOCIN) IVPB 1000 mg/200 mL premix  Status:  Discontinued        1,000 mg 200 mL/hr over 60 Minutes Intravenous  Once 06/23/23 2348 06/24/23 0022              Family Communication/Anticipated D/C date and plan/Code Status   DVT prophylaxis:      Code Status: Limited: Do not attempt resuscitation (DNR) -DNR-LIMITED -Do Not Intubate/DNI   Family Communication: None Disposition Plan: Plan to discharge home   Status is: Observation The patient will require care spanning > 2 midnights and should be moved to inpatient because: Sepsis secondary to UTI       Subjective:   Interval events noted.  She said she had 1 loose stool this morning.  No vomiting or abdominal pain.  Objective:    Vitals:   06/24/23 0123 06/24/23 0527 06/24/23 0846 06/24/23 1236  BP: (!) 108/54 (!) 119/51 (!) 132/48 (!) 146/45  Pulse: 74 74 79 72  Resp: 20 20 20 20   Temp: 98 F (36.7 C)  98.4 F (36.9 C) 98 F (36.7 C)  TempSrc:      SpO2: 94% 98% 95% 93%  Weight: 97.2 kg     Height: 5\' 4"  (1.626 m)      No data found.   Intake/Output Summary (Last 24 hours) at 06/24/2023 1512 Last data filed at 06/24/2023 0910 Gross per 24 hour  Intake 2978.5 ml  Output --  Net 2978.5 ml   Filed Weights   06/23/23 1918 06/24/23 0123  Weight: 101.3 kg 97.2 kg    Exam:  GEN: NAD SKIN: Warm and dry EYES: No pallor or icterus ENT: MMM CV: RRR PULM: CTA B ABD: soft, ND, NT, +BS CNS: AAO x 3, non focal EXT: No edema or tenderness        Data Reviewed:   I have personally reviewed following labs and imaging studies:  Labs: Labs show the following:   Basic Metabolic Panel: Recent Labs  Lab 06/23/23 1933 06/24/23 0608  NA 137 138  K 3.6 3.3*  CL 101 108  CO2 20* 21*  GLUCOSE 181* 102*  BUN 22 19  CREATININE 1.23* 0.87  CALCIUM 9.3 8.0*   GFR Estimated Creatinine Clearance: 53.5 mL/min (by C-G  formula based on SCr of 0.87 mg/dL). Liver Function Tests: Recent Labs  Lab 06/23/23 1933  AST 22  ALT 9  ALKPHOS 65  BILITOT 0.8  PROT 7.4  ALBUMIN 4.1   No results for input(s): "LIPASE", "AMYLASE"  in the last 168 hours. No results for input(s): "AMMONIA" in the last 168 hours. Coagulation profile Recent Labs  Lab 06/23/23 1933 06/24/23 0608  INR 1.0 1.0    CBC: Recent Labs  Lab 06/23/23 1933 06/24/23 0608  WBC 16.9* 8.5  NEUTROABS 14.9*  --   HGB 15.1* 11.9*  HCT 48.2* 36.9  MCV 92.0 91.3  PLT 228 215   Cardiac Enzymes: No results for input(s): "CKTOTAL", "CKMB", "CKMBINDEX", "TROPONINI" in the last 168 hours. BNP (last 3 results) No results for input(s): "PROBNP" in the last 8760 hours. CBG: No results for input(s): "GLUCAP" in the last 168 hours. D-Dimer: No results for input(s): "DDIMER" in the last 72 hours. Hgb A1c: No results for input(s): "HGBA1C" in the last 72 hours. Lipid Profile: No results for input(s): "CHOL", "HDL", "LDLCALC", "TRIG", "CHOLHDL", "LDLDIRECT" in the last 72 hours. Thyroid function studies: No results for input(s): "TSH", "T4TOTAL", "T3FREE", "THYROIDAB" in the last 72 hours.  Invalid input(s): "FREET3" Anemia work up: No results for input(s): "VITAMINB12", "FOLATE", "FERRITIN", "TIBC", "IRON", "RETICCTPCT" in the last 72 hours. Sepsis Labs: Recent Labs  Lab 06/23/23 1933 06/23/23 2217 06/24/23 0156 06/24/23 0608  WBC 16.9*  --   --  8.5  LATICACIDVEN 2.8* 3.6* 1.4  --     Microbiology Recent Results (from the past 240 hours)  Blood culture (routine x 2)     Status: None (Preliminary result)   Collection Time: 06/23/23  7:33 PM   Specimen: BLOOD  Result Value Ref Range Status   Specimen Description BLOOD RIGHT [facility]  Final   Special Requests   Final    BOTTLES DRAWN AEROBIC AND ANAEROBIC Blood Culture adequate volume   Culture   Final    NO GROWTH < 12 HOURS Performed at [facility], [address],  [place], [place] [address]    Report Status PENDING  Incomplete  Blood culture (routine x 2)     Status: None (Preliminary result)   Collection Time: 06/23/23 10:02 PM   Specimen: BLOOD  Result Value Ref Range Status   Specimen Description BLOOD RIGHT ARM  Final   Special Requests   Final    BOTTLES DRAWN AEROBIC AND ANAEROBIC Blood Culture results may not be optimal due to an inadequate volume of blood received in culture bottles   Culture   Final    NO GROWTH < 12 HOURS Performed at [facility], [address] [place], [place] [address]    Report Status PENDING  Incomplete  Gastrointestinal Panel by PCR , Stool     Status: Abnormal   Collection Time: 06/24/23  7:46 AM   Specimen: Stool  Result Value Ref Range Status   Campylobacter species NOT DETECTED NOT DETECTED Final   Plesimonas shigelloides NOT DETECTED NOT DETECTED Final   Salmonella species NOT DETECTED NOT DETECTED Final   Yersinia enterocolitica NOT DETECTED NOT DETECTED Final   Vibrio species NOT DETECTED NOT DETECTED Final   Vibrio cholerae NOT DETECTED NOT DETECTED Final   Enteroaggregative E coli (EAEC) NOT DETECTED NOT DETECTED Final   Enteropathogenic E coli (EPEC) DETECTED (A) NOT DETECTED Final    Comment: RESULT CALLED TO, READ BACK BY AND VERIFIED WITH: ROBYN THOMAS AT 1203 06/24/23.PMF    Enterotoxigenic E coli (ETEC) NOT DETECTED NOT DETECTED Final   Shiga like toxin producing E coli (STEC) NOT DETECTED NOT DETECTED Final   Shigella/Enteroinvasive E coli (EIEC) NOT DETECTED NOT DETECTED Final   Cryptosporidium NOT DETECTED NOT  DETECTED Final   Cyclospora cayetanensis NOT DETECTED NOT DETECTED Final   Entamoeba histolytica NOT DETECTED NOT DETECTED Final   Giardia lamblia NOT DETECTED NOT DETECTED Final   Adenovirus F40/41 NOT DETECTED NOT DETECTED Final   Astrovirus NOT DETECTED NOT DETECTED Final   Norovirus GI/GII NOT DETECTED NOT DETECTED Final   Rotavirus A NOT DETECTED NOT DETECTED Final    Sapovirus (I, II, IV, and V) NOT DETECTED NOT DETECTED Final    Comment: Performed at [facility], [address] [place], [place] [address]  C Difficile Quick Screen w PCR reflex     Status: None   Collection Time: 06/24/23  7:46 AM   Specimen: Stool  Result Value Ref Range Status   C Diff antigen NEGATIVE NEGATIVE Final   C Diff toxin NEGATIVE NEGATIVE Final   C Diff interpretation No C. difficile detected.  Final    Comment: Performed at [facility], [address] [place], [place] [address]    Procedures and diagnostic studies:  CT ABDOMEN PELVIS W CONTRAST Result Date: 06/23/2023 CLINICAL DATA:  Bowel obstruction, diarrhea, nausea EXAM: CT ABDOMEN AND PELVIS WITH CONTRAST TECHNIQUE: Multidetector CT imaging of the abdomen and pelvis was performed using the standard protocol following bolus administration of intravenous contrast. RADIATION DOSE REDUCTION: This exam was performed according to the departmental dose-optimization program which includes automated exposure control, adjustment of the mA and/or kV according to patient size and/or use of iterative reconstruction technique. CONTRAST:  80mL OMNIPAQUE IOHEXOL 300 MG/ML  SOLN COMPARISON:  08/26/2021 FINDINGS: Lower chest: Bibasilar cylindrical and cystic bronchiectasis with peripheral airway impaction seen within visualized lung bases, similar to prior examination. Mild superimposed peribronchial nodularity in keeping with bronchiolitis. No focal consolidation. Cardiac size within normal limits. Hepatobiliary: Cholelithiasis without pericholecystic inflammatory change noted. No enhancing intrahepatic mass. No intra or extrahepatic biliary ductal dilation. Pancreas: 2.4 x 2.1 cm cystic lesion has enlarged within the proximal body of the pancreas at axial image # 30/2, previously measuring 1.2 cm. This is in keeping with a dilated pancreatic side branch, intrapancreatic pseudocyst, or cystic neoplasm. Mild parenchymal  atrophy of the underlying pancreas. No superimposed peripancreatic inflammatory change. Spleen: Normal in size without focal abnormality. Adrenals/Urinary Tract: The adrenal glands are unremarkable. The kidneys are normal in size and position. Multiple simple cortical cysts are seen within kidneys bilaterally for which no follow-up imaging is recommended. The kidneys are otherwise unremarkable. Pelvic floor descent with a small cystocele noted. The bladder is otherwise unremarkable. Stomach/Bowel: Moderate descending and sigmoid diverticulosis. Stomach, small bowel, and large bowel are otherwise unremarkable. Appendix normal. No evidence of obstruction or focal inflammation. No free intraperitoneal gas or fluid. Vascular/Lymphatic: Aortic atherosclerosis. No enlarged abdominal or pelvic lymph nodes. Reproductive: Status post hysterectomy. Mild middle compartment descent secondary to pelvic floor relaxation. 2.4 cm cystic lesion within the left ovary appears slightly decreased in size since prior examination where this measured 2.4 cm and is safely considered benign. Other: No abdominal wall hernia. Pelvic floor descent with mild posterior compartment prolapse. Musculoskeletal: Osseous structures are age-appropriate. No acute bone abnormality. No lytic or blastic lesion IMPRESSION: 1. No acute intra-abdominal pathology identified. No definite radiographic explanation for the patient's reported symptoms. 2. Bibasilar cylindrical and cystic bronchiectasis with peripheral airway impaction, similar to prior examination. Mild superimposed peribronchial nodularity in keeping with bronchiolitis. 3. Cholelithiasis. 4. Moderate descending and sigmoid diverticulosis without superimposed acute inflammatory change. 5. Pelvic floor descent with a small cystocele and mild middle and posterior  compartment prolapse. 6. 2.4 cm cystic lesion within the proximal body of the pancreas, previously measuring 1.2 cm. This is in keeping with  a dilated pancreatic side branch, intrapancreatic pseudocyst, or cystic neoplasm. If indicated, this could be better assessed with dedicated contrast enhanced MRI examination. Aortic Atherosclerosis (ICD10-I70.0). Electronically Signed   By: Helyn Numbers M.D.   On: 06/23/2023 23:09   US ABDOMEN LIMITED RUQ (LIVER/GB) Result Date: 06/23/2023 CLINICAL DATA:  Unspecified abdominal pain EXAM: ULTRASOUND ABDOMEN LIMITED RIGHT UPPER QUADRANT COMPARISON:  None Available. FINDINGS: Gallbladder: No gallstones or wall thickening visualized. No sonographic Murphy sign noted by sonographer. Common bile duct: Diameter: 7 mm in proximal diameter Liver: Hepatic parenchymal echogenicity is diffusely increased in keeping with mild hepatic steatosis. No focal intrahepatic masses are seen and there is no intrahepatic biliary ductal dilation. portal vein is patent on color Doppler imaging with normal direction of blood flow towards the liver. Other: 1.6 x 1.9 x 2.4 cm simple cyst is seen subjacent to the left hepatic lobe which may represent an exophytic hepatic cyst, mesenteric cyst or pancreatic cyst/pseudocyst. IMPRESSION: 1. Mild hepatic steatosis. 2. 2.4 cm simple cyst subjacent to the left hepatic lobe which may represent an exophytic hepatic cyst, mesenteric cyst or pancreatic cyst/pseudocyst. This is better assessed on accompanying CT examination the abdomen pelvis. Electronically Signed   By: Helyn Numbers M.D.   On: 06/23/2023 22:58               LOS: 0 days   Quenten Nawaz  [facility]   Pager on [email or web address]. If 7PM-7AM, please contact night-coverage at [email or web address]     06/24/2023, 3:12 PM

## 2023-06-24 NOTE — Plan of Care (Signed)
  Problem: Fluid Volume: Goal: Hemodynamic stability will improve Outcome: Progressing   Problem: Clinical Measurements: Goal: Diagnostic test results will improve Outcome: Progressing Goal: Signs and symptoms of infection will decrease Outcome: Progressing   Problem: Education: Goal: Knowledge of General Education information will improve Description: Including pain rating scale, medication(s)/side effects and non-pharmacologic comfort measures Outcome: Not Progressing

## 2023-06-24 NOTE — Assessment & Plan Note (Signed)
 Continue Neurontin

## 2023-06-24 NOTE — Assessment & Plan Note (Signed)
-   We will continue PPI therapy and hold off NSAIDs.

## 2023-06-24 NOTE — Assessment & Plan Note (Addendum)
-   This is manifested by tachypnea and leukocytosis. - The patient be admitted to a medical telemetry observation bed - Will continue hydration with IV lactated ringer. - Continue antibiotic therapy with IV cefepime.  She was initially started on broader spectrum antibiotic before urinanalysis results. - Follow blood and urine cultures.

## 2023-06-24 NOTE — Assessment & Plan Note (Signed)
-   We will continue antihypertensive therapy.

## 2023-06-24 NOTE — Assessment & Plan Note (Signed)
-   The patient better with IV lactated ringer. - As needed antiemetics will be provided. - We will follow stool studies and C. difficile.

## 2023-06-24 NOTE — H&P (Addendum)
 [place]   PATIENT NAME: Candace Tapia    MR#:  [id number]  DATE OF BIRTH:  Jan 30, 1936  DATE OF ADMISSION:  06/23/2023  PRIMARY CARE PHYSICIAN: Smitty Cords, DO   Patient is coming from: Home  REQUESTING/REFERRING PHYSICIAN: Chiquita Loth, MD  CHIEF COMPLAINT:   Chief Complaint  Patient presents with   Abdominal Pain   Diarrhea    HISTORY OF PRESENT ILLNESS:  Candace Tapia is a 88 y.o. female with medical history significant for osteoarthritis, GERD, breast cancer status post lumpectomy, radiotherapy and chemotherapy, who presented to the emergency room with acute onset of recurrent nausea, vomiting and diarrhea since last night with associated upper abdominal discomfort.  She admitted to dyspnea without cough or wheezing.  She denies any bilious vomitus or hematemesis.  No melena or bright red bleeding per rectum.  No chest pain or palpitations.  No bleeding diathesis.  ED Course: When she came to the ER, temperature was 97.3 with otherwise normal vital signs.  Later on respiratory rate was 24.  Labs revealed leukocytosis of 16.9 with neutrophilia.  Lactic acid was 2.8 and later 3.6 then 1.4.  Blood glucose was 181 and CO2 20 with a creatinine 1.2 anion gap of 16 and otherwise unremarkable CMP.  UA was positive for UTI.  Stool pathogens and C. difficile were sent. EKG as reviewed by me : EKG showed sinus rhythm with a rate of 89 with left bundle branch block. Imaging: Abdominal pelvic CT scan revealed the following: 1. No acute intra-abdominal pathology identified. No definite radiographic explanation for the patient's reported symptoms. 2. Bibasilar cylindrical and cystic bronchiectasis with peripheral airway impaction, similar to prior examination. Mild superimposed peribronchial nodularity in keeping with bronchiolitis. 3. Cholelithiasis. 4. Moderate descending and sigmoid diverticulosis without superimposed acute inflammatory change. 5. Pelvic floor  descent with a small cystocele and mild middle and posterior compartment prolapse. 6. 2.4 cm cystic lesion within the proximal body of the pancreas, previously measuring 1.2 cm. This is in keeping with a dilated pancreatic side branch, intrapancreatic pseudocyst, or cystic neoplasm. If indicated, this could be better assessed with dedicated contrast enhanced MRI examination.  The patient was given Azactam, Flagyl and vancomycin, 1 L bolus of IV normal saline and 4 mg of IV Zofran.  She will be admitted to a medical telemetry observation bed for further evaluation and management.   PAST MEDICAL HISTORY:   Past Medical History:  Diagnosis Date   Allergy    Arthritis    Breast cancer (HCC) 2014   radiation and taking exemestane   Cancer of left breast (HCC) 10/27/2014   GERD (gastroesophageal reflux disease)    H/O: hysterectomy    History of lumpectomy 2014   LT breast   Spinal stenosis of lumbar region 03/08/2023    PAST SURGICAL HISTORY:   Past Surgical History:  Procedure Laterality Date   ABDOMINAL HYSTERECTOMY     BREAST LUMPECTOMY Left 2014   BREAST SURGERY Left 2014   JOINT REPLACEMENT     LYMPH NODE DISSECTION     neck   TOTAL SHOULDER REPLACEMENT Bilateral 2010 and 2013    SOCIAL HISTORY:   Social History   Tobacco Use   Smoking status: Former    Current packs/day: 0.00    Average packs/day: 2.0 packs/day for 40.0 years (80.0 ttl pk-yrs)    Types: Cigarettes    Start date: 04/19/1953    Quit date: 04/19/1993    Years since quitting:  30.2   Smokeless tobacco: Never  Substance Use Topics   Alcohol use: No    Alcohol/week: 0.0 standard drinks of alcohol    FAMILY HISTORY:   Family History  Problem Relation Age of Onset   Breast cancer Mother 43   Diabetes Mother    Kidney disease Father    Diabetes Father    Breast cancer Sister 76    DRUG ALLERGIES:   Allergies  Allergen Reactions   Tramadol Hcl Itching    Per pt it caused me to itch all over    Statins     Not sure what exact med was; just made her feel bad (can't elaborate)   Penicillin G Rash   Penicillins Rash    Has patient had a PCN reaction causing immediate rash, facial/tongue/throat swelling, SOB or lightheadedness with hypotension: No Has patient had a PCN reaction causing severe rash involving mucus membranes or skin necrosis: No Has patient had a PCN reaction that required hospitalization: No Has patient had a PCN reaction occurring within the last 10 years: No If all of the above answers are "NO", then may proceed with Cephalosporin use.   Sulfa Antibiotics Other (See Comments) and Rash    Other Reaction(s): Not available    REVIEW OF SYSTEMS:   ROS As per history of present illness. All pertinent systems were reviewed above. Constitutional, HEENT, cardiovascular, respiratory, GI, GU, musculoskeletal, neuro, psychiatric, endocrine, integumentary and hematologic systems were reviewed and are otherwise negative/unremarkable except for positive findings mentioned above in the HPI.   MEDICATIONS AT HOME:   Prior to Admission medications   Medication Sig Start Date End Date Taking? Authorizing Provider  acetaminophen (TYLENOL) 650 MG CR tablet Take 650 mg by mouth every 8 (eight) hours as needed for pain.    [provider]  albuterol (VENTOLIN HFA) 108 (90 Base) MCG/ACT inhaler Inhale 1 puff into the lungs every 6 (six) hours as needed.     [provider]  amLODipine (NORVASC) 5 MG tablet TAKE 1 TABLET(5 MG) BY MOUTH DAILY 01/10/23   Althea Charon, Netta Neat, DO  aspirin 81 MG chewable tablet Chew 81 mg by mouth daily as needed.    [provider]  chlorthalidone (HYGROTON) 25 MG tablet Take 1 tablet (25 mg total) by mouth daily. 03/07/23   Karamalegos, Netta Neat, DO  Cholecalciferol 25 MCG (1000 UT) capsule Vitamin D3 25 mcg (1,000 unit) capsule  1 po qd    [provider]  dicyclomine (BENTYL) 10 MG capsule Take 1 capsule (10  mg total) by mouth 3 (three) times daily before meals. Take as needed for abdominal bloating / cramping symptoms 02/24/23   Althea Charon, Netta Neat, DO  escitalopram (LEXAPRO) 10 MG tablet Take 1 tablet (10 mg total) by mouth daily. 04/17/23   Karamalegos, Netta Neat, DO  fluticasone (FLONASE) 50 MCG/ACT nasal spray Place 1 spray into both nostrils as needed.     [provider]  gabapentin (NEURONTIN) 400 MG capsule TAKE 1 CAPSULE(400 MG) BY MOUTH TWICE DAILY 05/15/23   Althea Charon, Netta Neat, DO  HYDROcodone-acetaminophen (NORCO/VICODIN) 5-325 MG tablet Take 1 tablet by mouth 2 (two) times daily as needed.    [provider]  losartan (COZAAR) 100 MG tablet Take 1 tablet (100 mg total) by mouth daily. 03/07/23   Smitty Cords, DO  magic mouthwash w/lidocaine SOLN Swish, gargle, and spit one to two teaspoonfuls every six hours as needed. Shake well before using. 04/17/23   Saralyn Pilar  J, DO  meclizine (ANTIVERT) 25 MG tablet Take 0.5 tablets (12.5 mg total) by mouth every 8 (eight) hours as needed for dizziness. 02/23/22   Karamalegos, Netta Neat, DO  meloxicam (MOBIC) 7.5 MG tablet Take 7.5 mg by mouth 3 (three) times daily.    [provider]  Multiple Vitamins-Minerals (MULTIVITAMIN WOMEN 50+) TABS Take 1 tablet by mouth daily.     [provider]  NEXIUM 20 MG capsule Take 1 capsule (20 mg total) by mouth daily. 07/09/15   Janeann Forehand., MD  ondansetron (ZOFRAN-ODT) 4 MG disintegrating tablet Take 1 tablet (4 mg total) by mouth every 8 (eight) hours as needed for nausea or vomiting. 02/24/23   Karamalegos, Netta Neat, DO  potassium chloride (MICRO-K) 10 MEQ CR capsule TAKE 1 CAPSULE(10 MEQ) BY MOUTH DAILY. MAY OPEN CAPSULE SPRINKLE ON SOFT FOOD OR. SWALLOW WHOLE 02/06/23   Karamalegos, Netta Neat, DO  Probiotic Product (PROBIOTIC DAILY PO) Take by mouth.    [provider]  Spacer/Aero-Holding Chambers (AEROCHAMBER PLUS)  inhaler Use as instructed 08/18/19   Domenick Gong, MD  fexofenadine [facility] ALLERGY) 180 MG tablet Take 180 mg by mouth as needed. Reported on 09/29/2015 01/29/14 08/18/19  [provider]      VITAL SIGNS:  Blood pressure (!) 108/54, pulse 74, temperature 98 F (36.7 C), resp. rate 20, height 5\' 4"  (1.626 m), weight 97.2 kg, SpO2 94%.  PHYSICAL EXAMINATION:  Physical Exam  GENERAL:  88 y.o.-year-old female patient lying in the bed with no acute distress.  EYES: Pupils equal, round, reactive to light and accommodation. No scleral icterus. Extraocular muscles intact.  HEENT: Head atraumatic, normocephalic. Oropharynx and nasopharynx clear.  NECK:  Supple, no jugular venous distention. No thyroid enlargement, no tenderness.  LUNGS: Normal breath sounds bilaterally, no wheezing, rales,rhonchi or crepitation. No use of accessory muscles of respiration.  CARDIOVASCULAR: Regular rate and rhythm, S1, S2 normal. No murmurs, rubs, or gallops.  ABDOMEN: Soft, nondistended, nontender. Bowel sounds present. No organomegaly or mass.  EXTREMITIES: No pedal edema, cyanosis, or clubbing.  NEUROLOGIC: Cranial nerves II through XII are intact. Muscle strength 5/5 in all extremities. Sensation intact. Gait not checked.  PSYCHIATRIC: The patient is alert and oriented x 3.  Normal affect and good eye contact. SKIN: No obvious rash, lesion, or ulcer.   LABORATORY PANEL:   CBC Recent Labs  Lab 06/23/23 1933  WBC 16.9*  HGB 15.1*  HCT 48.2*  PLT 228   ------------------------------------------------------------------------------------------------------------------  Chemistries  Recent Labs  Lab 06/23/23 1933  NA 137  K 3.6  CL 101  CO2 20*  GLUCOSE 181*  BUN 22  CREATININE 1.23*  CALCIUM 9.3  AST 22  ALT 9  ALKPHOS 65  BILITOT 0.8   ------------------------------------------------------------------------------------------------------------------  Cardiac Enzymes No results  for input(s): "TROPONINI" in the last 168 hours. ------------------------------------------------------------------------------------------------------------------  RADIOLOGY:  CT ABDOMEN PELVIS W CONTRAST Result Date: 06/23/2023 CLINICAL DATA:  Bowel obstruction, diarrhea, nausea EXAM: CT ABDOMEN AND PELVIS WITH CONTRAST TECHNIQUE: Multidetector CT imaging of the abdomen and pelvis was performed using the standard protocol following bolus administration of intravenous contrast. RADIATION DOSE REDUCTION: This exam was performed according to the departmental dose-optimization program which includes automated exposure control, adjustment of the mA and/or kV according to patient size and/or use of iterative reconstruction technique. CONTRAST:  80mL OMNIPAQUE IOHEXOL 300 MG/ML  SOLN COMPARISON:  08/26/2021 FINDINGS: Lower chest: Bibasilar cylindrical and cystic bronchiectasis with peripheral airway impaction seen within visualized lung bases, similar  to prior examination. Mild superimposed peribronchial nodularity in keeping with bronchiolitis. No focal consolidation. Cardiac size within normal limits. Hepatobiliary: Cholelithiasis without pericholecystic inflammatory change noted. No enhancing intrahepatic mass. No intra or extrahepatic biliary ductal dilation. Pancreas: 2.4 x 2.1 cm cystic lesion has enlarged within the proximal body of the pancreas at axial image # 30/2, previously measuring 1.2 cm. This is in keeping with a dilated pancreatic side branch, intrapancreatic pseudocyst, or cystic neoplasm. Mild parenchymal atrophy of the underlying pancreas. No superimposed peripancreatic inflammatory change. Spleen: Normal in size without focal abnormality. Adrenals/Urinary Tract: The adrenal glands are unremarkable. The kidneys are normal in size and position. Multiple simple cortical cysts are seen within kidneys bilaterally for which no follow-up imaging is recommended. The kidneys are otherwise unremarkable.  Pelvic floor descent with a small cystocele noted. The bladder is otherwise unremarkable. Stomach/Bowel: Moderate descending and sigmoid diverticulosis. Stomach, small bowel, and large bowel are otherwise unremarkable. Appendix normal. No evidence of obstruction or focal inflammation. No free intraperitoneal gas or fluid. Vascular/Lymphatic: Aortic atherosclerosis. No enlarged abdominal or pelvic lymph nodes. Reproductive: Status post hysterectomy. Mild middle compartment descent secondary to pelvic floor relaxation. 2.4 cm cystic lesion within the left ovary appears slightly decreased in size since prior examination where this measured 2.4 cm and is safely considered benign. Other: No abdominal wall hernia. Pelvic floor descent with mild posterior compartment prolapse. Musculoskeletal: Osseous structures are age-appropriate. No acute bone abnormality. No lytic or blastic lesion IMPRESSION: 1. No acute intra-abdominal pathology identified. No definite radiographic explanation for the patient's reported symptoms. 2. Bibasilar cylindrical and cystic bronchiectasis with peripheral airway impaction, similar to prior examination. Mild superimposed peribronchial nodularity in keeping with bronchiolitis. 3. Cholelithiasis. 4. Moderate descending and sigmoid diverticulosis without superimposed acute inflammatory change. 5. Pelvic floor descent with a small cystocele and mild middle and posterior compartment prolapse. 6. 2.4 cm cystic lesion within the proximal body of the pancreas, previously measuring 1.2 cm. This is in keeping with a dilated pancreatic side branch, intrapancreatic pseudocyst, or cystic neoplasm. If indicated, this could be better assessed with dedicated contrast enhanced MRI examination. Aortic Atherosclerosis (ICD10-I70.0). Electronically Signed   By: Helyn Numbers M.D.   On: 06/23/2023 23:09   US ABDOMEN LIMITED RUQ (LIVER/GB) Result Date: 06/23/2023 CLINICAL DATA:  Unspecified abdominal pain EXAM:  ULTRASOUND ABDOMEN LIMITED RIGHT UPPER QUADRANT COMPARISON:  None Available. FINDINGS: Gallbladder: No gallstones or wall thickening visualized. No sonographic Murphy sign noted by sonographer. Common bile duct: Diameter: 7 mm in proximal diameter Liver: Hepatic parenchymal echogenicity is diffusely increased in keeping with mild hepatic steatosis. No focal intrahepatic masses are seen and there is no intrahepatic biliary ductal dilation. portal vein is patent on color Doppler imaging with normal direction of blood flow towards the liver. Other: 1.6 x 1.9 x 2.4 cm simple cyst is seen subjacent to the left hepatic lobe which may represent an exophytic hepatic cyst, mesenteric cyst or pancreatic cyst/pseudocyst. IMPRESSION: 1. Mild hepatic steatosis. 2. 2.4 cm simple cyst subjacent to the left hepatic lobe which may represent an exophytic hepatic cyst, mesenteric cyst or pancreatic cyst/pseudocyst. This is better assessed on accompanying CT examination the abdomen pelvis. Electronically Signed   By: Helyn Numbers M.D.   On: 06/23/2023 22:58      IMPRESSION AND PLAN:  Assessment and Plan: * Sepsis due to gram-negative UTI (HCC) - This is manifested by tachypnea and leukocytosis. - The patient be admitted to a medical telemetry observation bed - Will continue hydration with  IV lactated ringer. - Continue antibiotic therapy with IV cefepime.  She was initially started on broader spectrum antibiotic before urinanalysis results. - Follow blood and urine cultures.  Acute gastroenteritis - The patient better with IV lactated ringer. - As needed antiemetics will be provided. - We will follow stool studies and C. difficile.  Essential hypertension - We will continue antihypertensive therapy.  GERD without esophagitis - We will continue PPI therapy and hold off NSAIDs.  Peripheral neuropathy - Continue Neurontin.    DVT prophylaxis: Lovenox.  Advanced Care Planning:  Code Status: The patient is  DNR and DNI. Family Communication:  The plan of care was discussed in details with the patient (and family). I answered all questions. The patient agreed to proceed with the above mentioned plan. Further management will depend upon hospital course. Disposition Plan: Back to previous home environment Consults called: none.  All the records are reviewed and case discussed with ED provider.  Status is: Observation  I certify that at the time of admission, it is my clinical judgment that the patient will require inpatient hospital care extending more than 2 midnights.                            Dispo: The patient is from: Home              Anticipated d/c is to: Home              Patient currently is not medically stable to d/c.              Difficult to place patient: No  Hannah Beat M.D on 06/24/2023 at 4:55 AM  [facility]   From 7 PM-7 AM, contact night-coverage [email or web address]  CC: Primary care physician; Smitty Cords, DO

## 2023-06-24 NOTE — Care Management Obs Status (Signed)
 MEDICARE OBSERVATION STATUS NOTIFICATION   Patient Details  Name: Candace Tapia MRN: [id number] Date of Birth: 1935/09/10   Medicare Observation Status Notification Given:  Yes (Patient signed a hard copy which was placed on chart and a copy given to patient at bedside.)    Bing Quarry, RN 06/24/2023, 5:28 PM

## 2023-06-24 NOTE — Plan of Care (Signed)

## 2023-06-24 NOTE — ED Notes (Signed)
 Vancomycin and flagyl sent with patient to floor for administration

## 2023-06-24 NOTE — Progress Notes (Signed)
 Pharmacy Antibiotic Note  Candace Tapia is a 88 y.o. female admitted on 06/23/2023 with sepsis of unknown source.  Pharmacy has been consulted for Aztreonam to Cefepime and Vancomycin dosing for 7 days.  Pt completed course of oral Keflex 11/04/2015, will transition pt form Aztreonam to Cefepime.  Plan: Cefepime 2 gm q12hr per indication & renal fxn.  Pt given Vancomycin 2000 mg once. Vancomycin 750 mg IV Q 24 hrs. Goal AUC 400-550. Expected AUC: 522 SCr used: 1.23, Vd used: 0.5, BMI: 38.2  Pharmacy will continue to follow and will adjust abx dosing whenever warranted.  Temp (24hrs), Avg:97.6 F (36.4 C), Min:97.3 F (36.3 C), Max:97.8 F (36.6 C)   Recent Labs  Lab 06/23/23 1933 06/23/23 2217  WBC 16.9*  --   CREATININE 1.23*  --   LATICACIDVEN 2.8* 3.6*    Estimated Creatinine Clearance: 38.7 mL/min (A) (by C-G formula based on SCr of 1.23 mg/dL (H)).    Allergies  Allergen Reactions   Tramadol Hcl Itching    Per pt it caused me to itch all over   Statins     Not sure what exact med was; just made her feel bad (can't elaborate)   Penicillin G Rash   Penicillins Rash    Has patient had a PCN reaction causing immediate rash, facial/tongue/throat swelling, SOB or lightheadedness with hypotension: No Has patient had a PCN reaction causing severe rash involving mucus membranes or skin necrosis: No Has patient had a PCN reaction that required hospitalization: No Has patient had a PCN reaction occurring within the last 10 years: No If all of the above answers are "NO", then may proceed with Cephalosporin use.   Sulfa Antibiotics Other (See Comments) and Rash    Other Reaction(s): Not available    Antimicrobials this admission: 3/07 Aztreonam >> x 1 dose 3/08 Cefepime >> x 7 days 3/08 Flagyl >> x 7 days 3/08 Vancomycin >> x 7 days  Microbiology results: 3/07 BCx: Pending  Thank you for allowing pharmacy to be a part of this patient's care.  Otelia Sergeant,  PharmD, [facility] 06/24/2023 12:35 AM

## 2023-06-25 DIAGNOSIS — I10 Essential (primary) hypertension: Secondary | ICD-10-CM | POA: Diagnosis not present

## 2023-06-25 DIAGNOSIS — J449 Chronic obstructive pulmonary disease, unspecified: Secondary | ICD-10-CM | POA: Diagnosis not present

## 2023-06-25 DIAGNOSIS — Z853 Personal history of malignant neoplasm of breast: Secondary | ICD-10-CM | POA: Diagnosis not present

## 2023-06-25 DIAGNOSIS — Z7982 Long term (current) use of aspirin: Secondary | ICD-10-CM | POA: Diagnosis not present

## 2023-06-25 DIAGNOSIS — Z87891 Personal history of nicotine dependence: Secondary | ICD-10-CM | POA: Diagnosis not present

## 2023-06-25 DIAGNOSIS — Z79899 Other long term (current) drug therapy: Secondary | ICD-10-CM | POA: Diagnosis not present

## 2023-06-25 DIAGNOSIS — K5730 Diverticulosis of large intestine without perforation or abscess without bleeding: Secondary | ICD-10-CM | POA: Diagnosis not present

## 2023-06-25 DIAGNOSIS — R531 Weakness: Secondary | ICD-10-CM | POA: Diagnosis not present

## 2023-06-25 DIAGNOSIS — A4159 Other Gram-negative sepsis: Secondary | ICD-10-CM | POA: Diagnosis not present

## 2023-06-25 DIAGNOSIS — K219 Gastro-esophageal reflux disease without esophagitis: Secondary | ICD-10-CM | POA: Diagnosis not present

## 2023-06-25 DIAGNOSIS — N390 Urinary tract infection, site not specified: Secondary | ICD-10-CM | POA: Diagnosis not present

## 2023-06-25 DIAGNOSIS — K529 Noninfective gastroenteritis and colitis, unspecified: Secondary | ICD-10-CM | POA: Diagnosis not present

## 2023-06-25 DIAGNOSIS — J9611 Chronic respiratory failure with hypoxia: Secondary | ICD-10-CM | POA: Diagnosis not present

## 2023-06-25 DIAGNOSIS — G629 Polyneuropathy, unspecified: Secondary | ICD-10-CM | POA: Diagnosis not present

## 2023-06-25 DIAGNOSIS — E876 Hypokalemia: Secondary | ICD-10-CM | POA: Diagnosis not present

## 2023-06-25 DIAGNOSIS — Z96612 Presence of left artificial shoulder joint: Secondary | ICD-10-CM | POA: Diagnosis not present

## 2023-06-25 DIAGNOSIS — A4150 Gram-negative sepsis, unspecified: Secondary | ICD-10-CM | POA: Diagnosis not present

## 2023-06-25 DIAGNOSIS — Z96611 Presence of right artificial shoulder joint: Secondary | ICD-10-CM | POA: Diagnosis not present

## 2023-06-25 LAB — CBC
HCT: 34.7 % — ABNORMAL LOW (ref 36.0–46.0)
Hemoglobin: 11.2 g/dL — ABNORMAL LOW (ref 12.0–15.0)
MCH: 28.9 pg (ref 26.0–34.0)
MCHC: 32.3 g/dL (ref 30.0–36.0)
MCV: 89.4 fL (ref 80.0–100.0)
Platelets: 198 10*3/uL (ref 150–400)
RBC: 3.88 MIL/uL (ref 3.87–5.11)
RDW: 14.5 % (ref 11.5–15.5)
WBC: 7.7 10*3/uL (ref 4.0–10.5)
nRBC: 0 % (ref 0.0–0.2)

## 2023-06-25 LAB — BASIC METABOLIC PANEL
Anion gap: 7 (ref 5–15)
BUN: 12 mg/dL (ref 8–23)
CO2: 23 mmol/L (ref 22–32)
Calcium: 8.4 mg/dL — ABNORMAL LOW (ref 8.9–10.3)
Chloride: 110 mmol/L (ref 98–111)
Creatinine, Ser: 0.8 mg/dL (ref 0.44–1.00)
GFR, Estimated: >60 mL/min (ref >60)
Glucose, Bld: 87 mg/dL (ref 70–99)
Potassium: 3.6 mmol/L (ref 3.5–5.1)
Sodium: 140 mmol/L (ref 135–145)

## 2023-06-25 MED ORDER — SODIUM CHLORIDE 0.9 % IV SOLN
1.0000 g | INTRAVENOUS | Status: DC
  Administered 2023-06-25 – 2023-06-26 (×2): 1 g via INTRAVENOUS
  Filled 2023-06-25 (×2): qty 10

## 2023-06-25 NOTE — Plan of Care (Signed)
  Problem: Fluid Volume: Goal: Hemodynamic stability will improve Outcome: Progressing   Problem: Clinical Measurements: Goal: Signs and symptoms of infection will decrease Outcome: Progressing   Problem: Clinical Measurements: Goal: Respiratory complications will improve Outcome: Progressing   Problem: Activity: Goal: Risk for activity intolerance will decrease Outcome: Progressing

## 2023-06-25 NOTE — Evaluation (Signed)
 Physical Therapy Evaluation Patient Details Name: Candace Tapia MRN: [id number] DOB: [birth year] Today's Date: 06/25/2023  History of Present Illness  Patient is a 88 year old female with acute onset of recurrent nausea, vomiting and diarrhea, urinalysis positive for UTI. Stool for GI panel was positive for enteropathogenic E. Coli. History of osteoarthritis, GERD, breast cancer status post lumpectomy, radiotherapy and chemotherapy  Clinical Impression  Patient is agreeable to PT evaluation. She reports she lives alone in a town house but has an Engineer, production that helps frequently with cooking, cleaning, and other things as needed. She is Mod I with 4 wheeled walker at baseline.  Today the patient is Mod I with bed mobility. Supervision for transfers for safety. She walked a short distance into the hallway with rolling walker with CGA- supervision. Mild dyspnea with exertion that subsides with seated rest break. She does report feeling generalized weakness with no appetite. Recommend to continue PT to maximize independence and facilitate return to prior level of function.       If plan is discharge home, recommend the following: A little help with bathing/dressing/bathroom;Assist for transportation;Assistance with cooking/housework   Can travel by private vehicle        Equipment Recommendations None recommended by PT  Recommendations for Other Services       Functional Status Assessment Patient has had a recent decline in their functional status and demonstrates the ability to make significant improvements in function in a reasonable and predictable amount of time.     Precautions / Restrictions Precautions Precautions: Fall Restrictions Weight Bearing Restrictions Per Provider Order: No      Mobility  Bed Mobility Overal bed mobility: Modified Independent                  Transfers Overall transfer level: Needs assistance Equipment used: Rolling walker (2 wheels) Transfers:  Sit to/from Stand Sit to Stand: Supervision           General transfer comment: verbal cues for safety    Ambulation/Gait Ambulation/Gait assistance: Supervision, Contact guard assist Gait Distance (Feet): 40 Feet Assistive device: Rolling walker (2 wheels) Gait Pattern/deviations: Step-through pattern Gait velocity: decreased     General Gait Details: further gait distance self limited due to reported fatigue. mild dyspnea with exertion that quickly subsides with rest break.  Stairs            Wheelchair Mobility     Tilt Bed    Modified Rankin (Stroke Patients Only)       Balance Overall balance assessment: Needs assistance Sitting-balance support: Feet supported Sitting balance-Leahy Scale: Good Sitting balance - Comments: patient able to reach outside base of support and complete peri care after urination without loss of balance   Standing balance support: Bilateral upper extremity supported, No upper extremity supported Standing balance-Leahy Scale: Fair Standing balance comment: RW used for dynamic activity with no loss of balance. static standing without UE support with no loss of balance                             Pertinent Vitals/Pain Pain Assessment Pain Assessment: No/denies pain    Home Living Family/patient expects to be discharged to:: Private residence Living Arrangements: Alone Available Help at Discharge: Personal care attendant Type of Home:  [employer]) Home Access: Level entry       Home Layout: One level Home Equipment: Rollator (4 wheels)      Prior Function Prior  Level of Function : Needs assist             Mobility Comments: Mod I with 4 wheeled walker ADLs Comments: has an aide that cooks cleans and helps with ADLs as needed     Extremity/Trunk Assessment   Upper Extremity Assessment Upper Extremity Assessment: Overall WFL for tasks assessed    Lower Extremity Assessment Lower Extremity  Assessment: Generalized weakness       Communication   Communication Communication: No apparent difficulties    Cognition Arousal: Alert Behavior During Therapy: WFL for tasks assessed/performed   PT - Cognitive impairments: No apparent impairments                         Following commands: Intact       Cueing Cueing Techniques: Verbal cues     General Comments General comments (skin integrity, edema, etc.): patient reports having no appetite and generally not feeling well. she complains of generalized weakness with mobility    Exercises     Assessment/Plan    PT Assessment Patient needs continued PT services  PT Problem List Decreased strength;Decreased range of motion;Decreased activity tolerance;Decreased balance;Decreased mobility       PT Treatment Interventions DME instruction;Gait training;Functional mobility training;Therapeutic exercise;Therapeutic activities;Balance training;Neuromuscular re-education;Patient/family education;Cognitive remediation    PT Goals (Current goals can be found in the Care Plan section)  Acute Rehab PT Goals Patient Stated Goal: to have more strength PT Goal Formulation: With patient Time For Goal Achievement: 07/09/23 Potential to Achieve Goals: Good    Frequency Min 2X/week     Co-evaluation               AM-PAC PT "6 Clicks" Mobility  Outcome Measure Help needed turning from your back to your side while in a flat bed without using bedrails?: None Help needed moving from lying on your back to sitting on the side of a flat bed without using bedrails?: A Little Help needed moving to and from a bed to a chair (including a wheelchair)?: A Little Help needed standing up from a chair using your arms (e.g., wheelchair or bedside chair)?: A Little Help needed to walk in hospital room?: A Little Help needed climbing 3-5 steps with a railing? : A Little 6 Click Score: 19    End of Session   Activity Tolerance:  Patient tolerated treatment well;Patient limited by fatigue Patient left: in bed;with call bell/phone within reach;with bed alarm set Nurse Communication: Mobility status PT Visit Diagnosis: Muscle weakness (generalized) (M62.81);Unsteadiness on feet (R26.81)    Time: 0981-1914 PT Time Calculation (min) (ACUTE ONLY): 23 min   Charges:   PT Evaluation $PT Eval Low Complexity: 1 Low   PT General Charges $$ ACUTE PT VISIT: 1 Visit         Donna Bernard, PT, MPT   Candace Tapia 06/25/2023, 3:22 PM

## 2023-06-25 NOTE — Progress Notes (Signed)
 Mobility Specialist - Progress Note   06/25/23 1030  Mobility  Activity Ambulated with assistance in hallway;Stood at bedside;Dangled on edge of bed  Level of Assistance Contact guard assist, steadying assist  Assistive Device Front wheel walker  Distance Ambulated (ft) 40 ft  Activity Response Tolerated well  Mobility Referral Yes  Mobility visit 1 Mobility  Mobility Specialist Start Time (ACUTE ONLY) [id number]  Mobility Specialist Stop Time (ACUTE ONLY) 1013  Mobility Specialist Time Calculation (min) (ACUTE ONLY) 31 min   Pt supine in bed on RA upon arrival. Pt completes bed mobility HHA. Pt STS, ambulates in hallway, transfers to from [facility] CGA. Pt returns to bed with needs in reach and bed alarm activated.   Terrilyn Saver  Mobility Specialist  06/25/23 10:38 AM

## 2023-06-25 NOTE — Progress Notes (Signed)
 Progress Note    [name]  AVW:[id number] DOB: Apr 03, 1936  DOA: 06/23/2023 PCP: Smitty Cords, DO      Brief Narrative:    Medical records reviewed and are as summarized below:  Candace Tapia is a 88 y.o. female with medical history significant for osteoarthritis, GERD, breast cancer status post lumpectomy, radiotherapy and chemotherapy, who presented to the emergency room with acute onset of recurrent nausea, vomiting and diarrhea.  She has been having recurrent nausea and abdominal pain for over a month.  She had seen the gastroenterologist on 05/16/2023 and symptoms were attributed to gastritis, GERD and possible IBS with intermittent bloating and cramping.  She had vomiting and diarrhea in the last few days. WBC was 16.9, lactic acid was 2.8 and 1 up to 3.6.  Urinalysis was positive for UTI.      Assessment/Plan:   Principal Problem:   Sepsis due to gram-negative UTI [facility]) Active Problems:   Acute gastroenteritis   Essential hypertension   Peripheral neuropathy   GERD without esophagitis    Body mass index is 36.78 kg/m.   Sepsis secondary to acute UTI, acute gastroenteritis: She still has some loose stools.  Continue IV ceftriaxone.  Follow-up urine culture. History of Klebsiella pneumoniae UTI in January 2019 that was sensitive to ceftriaxone.   Stool for GI panel was positive for enteropathogenic E. coli. Stool for C. difficile toxin was negative. Encouraged adequate oral intake   Hypertension: Continue antihypertensives   COPD, chronic hypoxic respiratory failure: Continue bronchodilators.  She uses 2 L/min oxygen at night at baseline   General Weakness: Patient complains of feeling very weak.  Her son wanted to make sure that patient will be safe to go back home.  Consult PT.  Comorbidities include GERD, peripheral neuropathy        Diet Order             Diet regular Room service appropriate? Yes; Fluid consistency:  Thin  Diet effective now                            Consultants: None  Procedures: None    Medications:    amLODipine  5 mg Oral Daily   chlorthalidone  25 mg Oral Daily   enoxaparin (LOVENOX) injection  50 mg Subcutaneous Q24H   escitalopram  10 mg Oral Daily   gabapentin  400 mg Oral BID   losartan  100 mg Oral Daily   multivitamin with minerals  1 tablet Oral Daily   Continuous Infusions:  cefTRIAXone (ROCEPHIN)  IV Stopped (06/25/23 1240)     Anti-infectives (From admission, onward)    Start     Dose/Rate Route Frequency Ordered Stop   06/25/23 1200  cefTRIAXone (ROCEPHIN) 1 g in sodium chloride 0.9 % 100 mL IVPB        1 g 200 mL/hr over 30 Minutes Intravenous Every 24 hours 06/25/23 0923     06/25/23 0300  vancomycin (VANCOREADY) IVPB 750 mg/150 mL  Status:  Discontinued        750 mg 150 mL/hr over 60 Minutes Intravenous Every 24 hours 06/24/23 0043 06/25/23 0923   06/24/23 0800  ceFEPIme (MAXIPIME) 2 g in sodium chloride 0.9 % 100 mL IVPB  Status:  Discontinued        2 g 200 mL/hr over 30 Minutes Intravenous Every 12 hours 06/24/23 0035 06/25/23 0923   06/24/23 0030  aztreonam (  AZACTAM) 2 g in sodium chloride 0.9 % 100 mL IVPB  Status:  Discontinued        2 g 200 mL/hr over 30 Minutes Intravenous  Once 06/24/23 0019 06/24/23 0023   06/24/23 0030  metroNIDAZOLE (FLAGYL) IVPB 500 mg  Status:  Discontinued        500 mg 100 mL/hr over 60 Minutes Intravenous Every 12 hours 06/24/23 0019 06/24/23 0453   06/24/23 0030  vancomycin (VANCOCIN) IVPB 1000 mg/200 mL premix  Status:  Discontinued        1,000 mg 200 mL/hr over 60 Minutes Intravenous  Once 06/24/23 0019 06/24/23 0023   06/24/23 0030  vancomycin (VANCOREADY) IVPB 2000 mg/400 mL  Status:  Discontinued        2,000 mg 200 mL/hr over 120 Minutes Intravenous  Once 06/24/23 0022 06/24/23 0519   06/24/23 0000  aztreonam (AZACTAM) 2 g in sodium chloride 0.9 % 100 mL IVPB        2 g 200 mL/hr  over 30 Minutes Intravenous  Once 06/23/23 2348 06/24/23 0027   06/24/23 0000  metroNIDAZOLE (FLAGYL) IVPB 500 mg  Status:  Discontinued        500 mg 100 mL/hr over 60 Minutes Intravenous  Once 06/23/23 2348 06/24/23 0023   06/24/23 0000  vancomycin (VANCOCIN) IVPB 1000 mg/200 mL premix  Status:  Discontinued        1,000 mg 200 mL/hr over 60 Minutes Intravenous  Once 06/23/23 2348 06/24/23 0022              Family Communication/Anticipated D/C date and plan/Code Status   DVT prophylaxis:      Code Status: Limited: Do not attempt resuscitation (DNR) -DNR-LIMITED -Do Not Intubate/DNI   Family Communication: Plan discussed with her son and daughter-in-law at the bedside Disposition Plan: Plan to discharge home   Status is: Observation The patient will require care spanning > 2 midnights and should be moved to inpatient because: Sepsis secondary to UTI       Subjective:   Interval events noted.  She had 1 loose stool this morning.  She feels weak.  No vomiting or abdominal pain.  No other complaints.  Mark, son, and daughter-in-law were at the bedside.  Objective:    Vitals:   06/25/23 0453 06/25/23 0739 06/25/23 1137 06/25/23 1449  BP: (!) 140/47 (!) 141/61 (!) 120/50 (!) 119/50  Pulse: 69 85 80 76  Resp: 18 17 16 16   Temp: 98.4 F (36.9 C) 98.2 F (36.8 C) 97.8 F (36.6 C) 98 F (36.7 C)  TempSrc: Oral     SpO2: 98% 96% 96% 96%  Weight:      Height:       No data found.   Intake/Output Summary (Last 24 hours) at 06/25/2023 1541 Last data filed at 06/25/2023 1240 Gross per 24 hour  Intake 451.05 ml  Output --  Net 451.05 ml   Filed Weights   06/23/23 1918 06/24/23 0123  Weight: 101.3 kg 97.2 kg    Exam:  GEN: NAD SKIN: Warm and dry EYES: No pallor or icterus ENT: MMM CV: RRR PULM: CTA B ABD: soft, ND, NT, +BS CNS: AAO x 3, non focal EXT: No edema or tenderness        Data Reviewed:   I have personally reviewed following labs and  imaging studies:  Labs: Labs show the following:   Basic Metabolic Panel: Recent Labs  Lab 06/23/23 1933 06/24/23 0608 06/25/23 0633  NA  137 138 140  K 3.6 3.3* 3.6  CL 101 108 110  CO2 20* 21* 23  GLUCOSE 181* 102* 87  BUN 22 19 12   CREATININE 1.23* 0.87 0.80  CALCIUM 9.3 8.0* 8.4*  MG  --  1.8  --    GFR Estimated Creatinine Clearance: 58.2 mL/min (by C-G formula based on SCr of 0.8 mg/dL). Liver Function Tests: Recent Labs  Lab 06/23/23 1933  AST 22  ALT 9  ALKPHOS 65  BILITOT 0.8  PROT 7.4  ALBUMIN 4.1   No results for input(s): "LIPASE", "AMYLASE" in the last 168 hours. No results for input(s): "AMMONIA" in the last 168 hours. Coagulation profile Recent Labs  Lab 06/23/23 1933 06/24/23 0608  INR 1.0 1.0    CBC: Recent Labs  Lab 06/23/23 1933 06/24/23 0608 06/25/23 0633  WBC 16.9* 8.5 7.7  NEUTROABS 14.9*  --   --   HGB 15.1* 11.9* 11.2*  HCT 48.2* 36.9 34.7*  MCV 92.0 91.3 89.4  PLT 228 215 198   Cardiac Enzymes: No results for input(s): "CKTOTAL", "CKMB", "CKMBINDEX", "TROPONINI" in the last 168 hours. BNP (last 3 results) No results for input(s): "PROBNP" in the last 8760 hours. CBG: No results for input(s): "GLUCAP" in the last 168 hours. D-Dimer: No results for input(s): "DDIMER" in the last 72 hours. Hgb A1c: No results for input(s): "HGBA1C" in the last 72 hours. Lipid Profile: No results for input(s): "CHOL", "HDL", "LDLCALC", "TRIG", "CHOLHDL", "LDLDIRECT" in the last 72 hours. Thyroid function studies: No results for input(s): "TSH", "T4TOTAL", "T3FREE", "THYROIDAB" in the last 72 hours.  Invalid input(s): "FREET3" Anemia work up: No results for input(s): "VITAMINB12", "FOLATE", "FERRITIN", "TIBC", "IRON", "RETICCTPCT" in the last 72 hours. Sepsis Labs: Recent Labs  Lab 06/23/23 1933 06/23/23 2217 06/24/23 0156 06/24/23 0608 06/25/23 0633  WBC 16.9*  --   --  8.5 7.7  LATICACIDVEN 2.8* 3.6* 1.4  --   --      Microbiology Recent Results (from the past 240 hours)  Blood culture (routine x 2)     Status: None (Preliminary result)   Collection Time: 06/23/23  7:33 PM   Specimen: BLOOD  Result Value Ref Range Status   Specimen Description BLOOD RIGHT [facility]  Final   Special Requests   Final    BOTTLES DRAWN AEROBIC AND ANAEROBIC Blood Culture adequate volume   Culture   Final    NO GROWTH 2 DAYS Performed at [facility], [address] [place], [place] [address]    Report Status PENDING  Incomplete  Blood culture (routine x 2)     Status: None (Preliminary result)   Collection Time: 06/23/23 10:02 PM   Specimen: BLOOD  Result Value Ref Range Status   Specimen Description BLOOD RIGHT ARM  Final   Special Requests   Final    BOTTLES DRAWN AEROBIC AND ANAEROBIC Blood Culture results may not be optimal due to an inadequate volume of blood received in culture bottles   Culture   Final    NO GROWTH 2 DAYS Performed at [facility], [address] [place], [place] [address]    Report Status PENDING  Incomplete  Urine Culture (for pregnant, neutropenic or urologic patients or patients with an indwelling urinary catheter)     Status: None (Preliminary result)   Collection Time: 06/24/23  3:12 AM   Specimen: Urine, Catheterized  Result Value Ref Range Status   Specimen Description   Final    URINE, CATHETERIZED  Performed at [facility], [address], [place], [place] [address]    Special Requests   Final    NONE Performed at [facility], [address], [place], [place] [address]    Culture   Final    CULTURE REINCUBATED FOR BETTER GROWTH Performed at [facility], [address], [place], [place] [address]    Report Status PENDING  Incomplete  Gastrointestinal Panel by PCR , Stool     Status: Abnormal   Collection Time: 06/24/23  7:46 AM   Specimen: Stool  Result Value Ref Range Status   Campylobacter species NOT DETECTED NOT  DETECTED Final   Plesimonas shigelloides NOT DETECTED NOT DETECTED Final   Salmonella species NOT DETECTED NOT DETECTED Final   Yersinia enterocolitica NOT DETECTED NOT DETECTED Final   Vibrio species NOT DETECTED NOT DETECTED Final   Vibrio cholerae NOT DETECTED NOT DETECTED Final   Enteroaggregative E coli (EAEC) NOT DETECTED NOT DETECTED Final   Enteropathogenic E coli (EPEC) DETECTED (A) NOT DETECTED Final    Comment: RESULT CALLED TO, READ BACK BY AND VERIFIED WITH: ROBYN THOMAS AT 1203 06/24/23.PMF    Enterotoxigenic E coli (ETEC) NOT DETECTED NOT DETECTED Final   Shiga like toxin producing E coli (STEC) NOT DETECTED NOT DETECTED Final   Shigella/Enteroinvasive E coli (EIEC) NOT DETECTED NOT DETECTED Final   Cryptosporidium NOT DETECTED NOT DETECTED Final   Cyclospora cayetanensis NOT DETECTED NOT DETECTED Final   Entamoeba histolytica NOT DETECTED NOT DETECTED Final   Giardia lamblia NOT DETECTED NOT DETECTED Final   Adenovirus F40/41 NOT DETECTED NOT DETECTED Final   Astrovirus NOT DETECTED NOT DETECTED Final   Norovirus GI/GII NOT DETECTED NOT DETECTED Final   Rotavirus A NOT DETECTED NOT DETECTED Final   Sapovirus (I, II, IV, and V) NOT DETECTED NOT DETECTED Final    Comment: Performed at [facility], [address] [place], [place] [address]  C Difficile Quick Screen w PCR reflex     Status: None   Collection Time: 06/24/23  7:46 AM   Specimen: Stool  Result Value Ref Range Status   C Diff antigen NEGATIVE NEGATIVE Final   C Diff toxin NEGATIVE NEGATIVE Final   C Diff interpretation No C. difficile detected.  Final    Comment: Performed at [facility], [address] [place], [place] [address]  MRSA Next Gen by PCR, Nasal     Status: None   Collection Time: 06/24/23  2:01 PM   Specimen: Nasal Mucosa; Nasal Swab  Result Value Ref Range Status   MRSA by PCR Next Gen NOT DETECTED NOT DETECTED Final    Comment: (NOTE) The GeneXpert MRSA Assay (FDA  approved for NASAL specimens only), is one component of a comprehensive MRSA colonization surveillance program. It is not intended to diagnose MRSA infection nor to guide or monitor treatment for MRSA infections. Test performance is not FDA approved in patients less than 60 years old. Performed at [facility], [address] [place], [place] [address]     Procedures and diagnostic studies:  CT ABDOMEN PELVIS W CONTRAST Result Date: 06/23/2023 CLINICAL DATA:  Bowel obstruction, diarrhea, nausea EXAM: CT ABDOMEN AND PELVIS WITH CONTRAST TECHNIQUE: Multidetector CT imaging of the abdomen and pelvis was performed using the standard protocol following bolus administration of intravenous contrast. RADIATION DOSE REDUCTION: This exam was performed according to the departmental dose-optimization program which includes automated exposure control, adjustment of the mA and/or kV according  to patient size and/or use of iterative reconstruction technique. CONTRAST:  80mL OMNIPAQUE IOHEXOL 300 MG/ML  SOLN COMPARISON:  08/26/2021 FINDINGS: Lower chest: Bibasilar cylindrical and cystic bronchiectasis with peripheral airway impaction seen within visualized lung bases, similar to prior examination. Mild superimposed peribronchial nodularity in keeping with bronchiolitis. No focal consolidation. Cardiac size within normal limits. Hepatobiliary: Cholelithiasis without pericholecystic inflammatory change noted. No enhancing intrahepatic mass. No intra or extrahepatic biliary ductal dilation. Pancreas: 2.4 x 2.1 cm cystic lesion has enlarged within the proximal body of the pancreas at axial image # 30/2, previously measuring 1.2 cm. This is in keeping with a dilated pancreatic side branch, intrapancreatic pseudocyst, or cystic neoplasm. Mild parenchymal atrophy of the underlying pancreas. No superimposed peripancreatic inflammatory change. Spleen: Normal in size without focal abnormality. Adrenals/Urinary Tract:  The adrenal glands are unremarkable. The kidneys are normal in size and position. Multiple simple cortical cysts are seen within kidneys bilaterally for which no follow-up imaging is recommended. The kidneys are otherwise unremarkable. Pelvic floor descent with a small cystocele noted. The bladder is otherwise unremarkable. Stomach/Bowel: Moderate descending and sigmoid diverticulosis. Stomach, small bowel, and large bowel are otherwise unremarkable. Appendix normal. No evidence of obstruction or focal inflammation. No free intraperitoneal gas or fluid. Vascular/Lymphatic: Aortic atherosclerosis. No enlarged abdominal or pelvic lymph nodes. Reproductive: Status post hysterectomy. Mild middle compartment descent secondary to pelvic floor relaxation. 2.4 cm cystic lesion within the left ovary appears slightly decreased in size since prior examination where this measured 2.4 cm and is safely considered benign. Other: No abdominal wall hernia. Pelvic floor descent with mild posterior compartment prolapse. Musculoskeletal: Osseous structures are age-appropriate. No acute bone abnormality. No lytic or blastic lesion IMPRESSION: 1. No acute intra-abdominal pathology identified. No definite radiographic explanation for the patient's reported symptoms. 2. Bibasilar cylindrical and cystic bronchiectasis with peripheral airway impaction, similar to prior examination. Mild superimposed peribronchial nodularity in keeping with bronchiolitis. 3. Cholelithiasis. 4. Moderate descending and sigmoid diverticulosis without superimposed acute inflammatory change. 5. Pelvic floor descent with a small cystocele and mild middle and posterior compartment prolapse. 6. 2.4 cm cystic lesion within the proximal body of the pancreas, previously measuring 1.2 cm. This is in keeping with a dilated pancreatic side branch, intrapancreatic pseudocyst, or cystic neoplasm. If indicated, this could be better assessed with dedicated contrast enhanced  MRI examination. Aortic Atherosclerosis (ICD10-I70.0). Electronically Signed   By: Helyn Numbers M.D.   On: 06/23/2023 23:09   US ABDOMEN LIMITED RUQ (LIVER/GB) Result Date: 06/23/2023 CLINICAL DATA:  Unspecified abdominal pain EXAM: ULTRASOUND ABDOMEN LIMITED RIGHT UPPER QUADRANT COMPARISON:  None Available. FINDINGS: Gallbladder: No gallstones or wall thickening visualized. No sonographic Murphy sign noted by sonographer. Common bile duct: Diameter: 7 mm in proximal diameter Liver: Hepatic parenchymal echogenicity is diffusely increased in keeping with mild hepatic steatosis. No focal intrahepatic masses are seen and there is no intrahepatic biliary ductal dilation. portal vein is patent on color Doppler imaging with normal direction of blood flow towards the liver. Other: 1.6 x 1.9 x 2.4 cm simple cyst is seen subjacent to the left hepatic lobe which may represent an exophytic hepatic cyst, mesenteric cyst or pancreatic cyst/pseudocyst. IMPRESSION: 1. Mild hepatic steatosis. 2. 2.4 cm simple cyst subjacent to the left hepatic lobe which may represent an exophytic hepatic cyst, mesenteric cyst or pancreatic cyst/pseudocyst. This is better assessed on accompanying CT examination the abdomen pelvis. Electronically Signed   By: Helyn Numbers M.D.   On: 06/23/2023 22:58  LOS: 0 days   Jazz Rogala  [facility]   Pager on [email or web address]. If 7PM-7AM, please contact night-coverage at [email or web address]     06/25/2023, 3:41 PM

## 2023-06-25 NOTE — Plan of Care (Signed)
  Problem: Fluid Volume: Goal: Hemodynamic stability will improve Outcome: Progressing   Problem: Clinical Measurements: Goal: Signs and symptoms of infection will decrease Outcome: Progressing   Problem: Clinical Measurements: Goal: Ability to maintain clinical measurements within normal limits will improve Outcome: Progressing Goal: Will remain free from infection Outcome: Progressing   Problem: Activity: Goal: Risk for activity intolerance will decrease Outcome: Progressing   Problem: Coping: Goal: Level of anxiety will decrease Outcome: Progressing   Problem: Elimination: Goal: Will not experience complications related to bowel motility Outcome: Progressing Goal: Will not experience complications related to urinary retention Outcome: Progressing   Problem: Pain Managment: Goal: General experience of comfort will improve and/or be controlled Outcome: Progressing   Problem: Safety: Goal: Ability to remain free from injury will improve Outcome: Progressing   Problem: Skin Integrity: Goal: Risk for impaired skin integrity will decrease Outcome: Progressing   Problem: Education: Goal: Knowledge of General Education information will improve Description: Including pain rating scale, medication(s)/side effects and non-pharmacologic comfort measures Outcome: Not Progressing   Problem: Health Behavior/Discharge Planning: Goal: Ability to manage health-related needs will improve Outcome: Not Progressing   Problem: Nutrition: Goal: Adequate nutrition will be maintained Outcome: Not Progressing   Problem: Clinical Measurements: Goal: Diagnostic test results will improve Outcome: Not Applicable   Problem: Respiratory: Goal: Ability to maintain adequate ventilation will improve Outcome: Not Applicable   Problem: Clinical Measurements: Goal: Diagnostic test results will improve Outcome: Not Applicable Goal: Respiratory complications will improve Outcome: Not  Applicable Goal: Cardiovascular complication will be avoided Outcome: Not Applicable   Problem: Education: Goal: Knowledge of General Education information will improve Description: Including pain rating scale, medication(s)/side effects and non-pharmacologic comfort measures Outcome: Not Progressing   Problem: Health Behavior/Discharge Planning: Goal: Ability to manage health-related needs will improve Outcome: Not Progressing   Problem: Nutrition: Goal: Adequate nutrition will be maintained Outcome: Not Progressing   Problem: Fluid Volume: Goal: Hemodynamic stability will improve Outcome: Progressing   Problem: Clinical Measurements: Goal: Signs and symptoms of infection will decrease Outcome: Progressing   Problem: Clinical Measurements: Goal: Ability to maintain clinical measurements within normal limits will improve Outcome: Progressing Goal: Will remain free from infection Outcome: Progressing   Problem: Activity: Goal: Risk for activity intolerance will decrease Outcome: Progressing   Problem: Coping: Goal: Level of anxiety will decrease Outcome: Progressing   Problem: Elimination: Goal: Will not experience complications related to bowel motility Outcome: Progressing Goal: Will not experience complications related to urinary retention Outcome: Progressing   Problem: Pain Managment: Goal: General experience of comfort will improve and/or be controlled Outcome: Progressing   Problem: Safety: Goal: Ability to remain free from injury will improve Outcome: Progressing   Problem: Skin Integrity: Goal: Risk for impaired skin integrity will decrease Outcome: Progressing

## 2023-06-26 DIAGNOSIS — Z853 Personal history of malignant neoplasm of breast: Secondary | ICD-10-CM | POA: Diagnosis not present

## 2023-06-26 DIAGNOSIS — J449 Chronic obstructive pulmonary disease, unspecified: Secondary | ICD-10-CM | POA: Diagnosis not present

## 2023-06-26 DIAGNOSIS — I10 Essential (primary) hypertension: Secondary | ICD-10-CM | POA: Diagnosis not present

## 2023-06-26 DIAGNOSIS — B952 Enterococcus as the cause of diseases classified elsewhere: Secondary | ICD-10-CM | POA: Diagnosis not present

## 2023-06-26 DIAGNOSIS — G629 Polyneuropathy, unspecified: Secondary | ICD-10-CM | POA: Diagnosis not present

## 2023-06-26 DIAGNOSIS — Z87891 Personal history of nicotine dependence: Secondary | ICD-10-CM | POA: Diagnosis not present

## 2023-06-26 DIAGNOSIS — K219 Gastro-esophageal reflux disease without esophagitis: Secondary | ICD-10-CM | POA: Diagnosis not present

## 2023-06-26 DIAGNOSIS — K529 Noninfective gastroenteritis and colitis, unspecified: Secondary | ICD-10-CM | POA: Diagnosis not present

## 2023-06-26 DIAGNOSIS — Z96611 Presence of right artificial shoulder joint: Secondary | ICD-10-CM | POA: Diagnosis not present

## 2023-06-26 DIAGNOSIS — Z96612 Presence of left artificial shoulder joint: Secondary | ICD-10-CM | POA: Diagnosis not present

## 2023-06-26 DIAGNOSIS — Z79899 Other long term (current) drug therapy: Secondary | ICD-10-CM | POA: Diagnosis not present

## 2023-06-26 DIAGNOSIS — N390 Urinary tract infection, site not specified: Secondary | ICD-10-CM | POA: Diagnosis not present

## 2023-06-26 DIAGNOSIS — R531 Weakness: Secondary | ICD-10-CM | POA: Diagnosis not present

## 2023-06-26 DIAGNOSIS — J9611 Chronic respiratory failure with hypoxia: Secondary | ICD-10-CM | POA: Diagnosis not present

## 2023-06-26 DIAGNOSIS — Z7982 Long term (current) use of aspirin: Secondary | ICD-10-CM | POA: Diagnosis not present

## 2023-06-26 DIAGNOSIS — A4159 Other Gram-negative sepsis: Secondary | ICD-10-CM | POA: Diagnosis not present

## 2023-06-26 DIAGNOSIS — E876 Hypokalemia: Secondary | ICD-10-CM | POA: Diagnosis not present

## 2023-06-26 DIAGNOSIS — A4150 Gram-negative sepsis, unspecified: Secondary | ICD-10-CM | POA: Diagnosis not present

## 2023-06-26 DIAGNOSIS — K5730 Diverticulosis of large intestine without perforation or abscess without bleeding: Secondary | ICD-10-CM | POA: Diagnosis not present

## 2023-06-26 LAB — URINE CULTURE: Culture: >=100000 — AB

## 2023-06-26 MED ORDER — NITROFURANTOIN MONOHYD MACRO 100 MG PO CAPS
100.0000 mg | ORAL_CAPSULE | Freq: Two times a day (BID) | ORAL | Status: DC
  Administered 2023-06-26 – 2023-06-27 (×3): 100 mg via ORAL
  Filled 2023-06-26 (×3): qty 1

## 2023-06-26 NOTE — Progress Notes (Signed)
 Physical Therapy Treatment Patient Details Name: Candace Tapia MRN: [id number] DOB: [birth year] Today's Date: 06/26/2023   History of Present Illness Patient is a 88 year old female with acute onset of recurrent nausea, vomiting and diarrhea, urinalysis positive for UTI. Stool for GI panel was positive for enteropathogenic E. Coli. History of osteoarthritis, GERD, breast cancer status post lumpectomy, radiotherapy and chemotherapy    PT Comments  Pt received in bed, c/o not being able to eat due to nausea, also expressing feelings of sadness due to current illness/hospital stay, nursing aware. Pt transferred to EOB to [facility] without AD and Supervison, Independent hygiene management after BM. Transfers with Supervision from various surfaces. Pt completed gait training with Rollator (baseline device at home) in hallway with Supervion/CGA, cues for safety prior to seated rest break. Pt DOE after ambulating 70ft, HR up to 118bpm from resting HR of 85bpm. Quick to recover and complete gait training back to room. Pt is functionally improving, however remains weak and po intake is low due to possible stomach upset from antibiotics. Will continue to progress per POC.    If plan is discharge home, recommend the following: A little help with bathing/dressing/bathroom;Assist for transportation;Assistance with cooking/housework   Can travel by private vehicle        Equipment Recommendations  None recommended by PT    Recommendations for Other Services       Precautions / Restrictions Precautions Precautions: Fall Restrictions Weight Bearing Restrictions Per Provider Order: No     Mobility  Bed Mobility Overal bed mobility: Modified Independent                  Transfers Overall transfer level: Needs assistance Equipment used: None Transfers: Bed to chair/wheelchair/BSC, Sit to/from Stand Sit to Stand: Supervision Stand pivot transfers: Supervision (without assistive device)          General transfer comment: safe technique with bed>commode without AD    Ambulation/Gait Ambulation/Gait assistance: Supervision, Contact guard assist Gait Distance (Feet): 55 Feet Assistive device: Rollator (4 wheels) Gait Pattern/deviations: Step-through pattern Gait velocity: decreased     General Gait Details: 55'x2, DOE, SpO2 98%, HR increased to 118bpm from 85bpm at rest. Pt recovers quickly to baseline after seated rest break   Stairs             Wheelchair Mobility     Tilt Bed    Modified Rankin (Stroke Patients Only)       Balance Overall balance assessment: Needs assistance Sitting-balance support: Feet supported Sitting balance-Leahy Scale: Good Sitting balance - Comments: patient able to reach outside base of support and complete peri care after BM without loss of balance   Standing balance support: Bilateral upper extremity supported, No upper extremity supported Standing balance-Leahy Scale: Fair Standing balance comment: Rollator used for dynamic activity with no loss of balance. static standing without UE support with no loss of balance                            Communication Communication Communication: Impaired Factors Affecting Communication: Hearing impaired  Cognition Arousal: Alert Behavior During Therapy: WFL for tasks assessed/performed   PT - Cognitive impairments: No apparent impairments                       PT - Cognition Comments: Pleasant and cooperative, expresses sadness and loneliness Following commands: Intact      Cueing Cueing Techniques: Verbal cues  Exercises General Exercises - Lower Extremity Ankle Circles/Pumps: AROM, Both, 10 reps, Seated Long Arc Quad: AROM, Both, 10 reps, Seated Hip Flexion/Marching: AROM, Both, 10 reps, Seated    General Comments General comments (skin integrity, edema, etc.): Pt educated on role of PT and benefits of mobility/OOB activity.      Pertinent  Vitals/Pain Pain Assessment Pain Assessment: No/denies pain    Home Living                          Prior Function            PT Goals (current goals can now be found in the care plan section) Acute Rehab PT Goals Patient Stated Goal: to have more strength Progress towards PT goals: Progressing toward goals    Frequency    Min 2X/week      PT Plan      Co-evaluation              AM-PAC PT "6 Clicks" Mobility   Outcome Measure  Help needed turning from your back to your side while in a flat bed without using bedrails?: None Help needed moving from lying on your back to sitting on the side of a flat bed without using bedrails?: A Little Help needed moving to and from a bed to a chair (including a wheelchair)?: A Little Help needed standing up from a chair using your arms (e.g., wheelchair or bedside chair)?: A Little Help needed to walk in hospital room?: A Little Help needed climbing 3-5 steps with a railing? : A Little 6 Click Score: 19    End of Session Equipment Utilized During Treatment: Gait belt Activity Tolerance: Patient tolerated treatment well;Patient limited by fatigue Patient left: in chair;with call bell/phone within reach;with chair alarm set Nurse Communication: Mobility status PT Visit Diagnosis: Muscle weakness (generalized) (M62.81);Unsteadiness on feet (R26.81)     Time: 4098-1191 PT Time Calculation (min) (ACUTE ONLY): 43 min  Charges:    $Gait Training: 8-22 mins $Therapeutic Exercise: 8-22 mins $Therapeutic Activity: 8-22 mins PT General Charges $$ ACUTE PT VISIT: 1 Visit                    Zadie Cleverly, PTA  Jannet Askew 06/26/2023, 11:01 AM

## 2023-06-26 NOTE — Plan of Care (Signed)
  Problem: Clinical Measurements: Goal: Ability to maintain clinical measurements within normal limits will improve Outcome: Progressing   Problem: Activity: Goal: Risk for activity intolerance will decrease Outcome: Progressing   Problem: Pain Managment: Goal: General experience of comfort will improve and/or be controlled Outcome: Progressing

## 2023-06-26 NOTE — Progress Notes (Signed)
 Progress Note   Patient: Candace Tapia HQI:[id number] DOB: 1936-04-01 DOA: 06/23/2023     0 DOS: the patient was seen and examined on 06/26/2023   Brief hospital course: Candace Tapia is a 88 y.o. female with past medical history of osteoarthritis, GERD, breast cancer status post lumpectomy, radiotherapy and chemotherapy, who presented to the emergency room with acute onset of recurrent nausea, vomiting and diarrhea.  Recently diagnosed with gastritis, GERD and possible IBS with intermittent bloating and cramping.  She had vomiting and diarrhea in the last few days. WBC was 16.9, lactic acid was 2.8 and 1 up to 3.6.  Urinalysis was positive for UTI admitted to hospitalist service for further management.  GI panel came back positive for enteropathogenic E. coli.  Assessment and Plan: Sepsis secondary to acute UTI, acute gastroenteritis: She still has some loose stools.  Lactic acid trended down. WBC trended down. Urine cultures grew Enterococcus faecalis.  Sensitivities reviewed, discussed with pharmacy.  IV ceftriaxone changed to Macrobid. Stool for GI panel was positive for enteropathogenic E. coli. Stool for C. difficile toxin was negative. Encouraged adequate oral intake. Continue supportive care.    Hypertension: Continue home dose Norvasc, losartan therapy.    COPD, chronic hypoxic respiratory failure: Continue bronchodilators.  She uses 2 L/min oxygen at night at baseline.    General Weakness:  PT OT follow-up for discharge needs. Son stated that she has a walker at home. TOC for discharge needs.   Hypokalemia-improved status post supplementation.  Continue PPI for GERD.  Peripheral neuropathy-continue gabapentin therapy.     Reviewed home medications include Norco which is already resumed. Out of bed to chair. Incentive spirometry. Nursing supportive care. Fall, aspiration precautions. DVT prophylaxis   Code Status: Limited: Do not attempt resuscitation (DNR)  -DNR-LIMITED -Do Not Intubate/DNI   Subjective: Patient is seen and examined today morning, She feels sick, has nausea. Did not eat breakfast, wanted to drink coffee. Did not get out of bed.  Physical Exam: Vitals:   06/25/23 1930 06/26/23 0423 06/26/23 0818 06/26/23 1551  BP: (!) 150/63 (!) 149/75 (!) 156/64 (!) 125/46  Pulse: 76 78 85 72  Resp: 16 17 19 19   Temp: 98.7 F (37.1 C) (!) 97.5 F (36.4 C) 97.7 F (36.5 C) 97.9 F (36.6 C)  TempSrc:  Oral Oral Oral  SpO2: 98% 97% 96% 93%  Weight:      Height:        General - Elderly ill Caucasian female, in distress due to feeling sick, nausea HEENT - PERRLA, EOMI, atraumatic head, hard of hearing. Lung - Clear, diffuse rales, no rhonchi, wheezes. Heart - S1, S2 heard, no murmurs, rubs, trace pedal edema. Abdomen - Soft, non tender, bowel sounds good Neuro - Alert, awake and oriented x 3, non focal exam. Skin - Warm and dry.  Data Reviewed:      Latest Ref Rng & Units 06/25/2023    6:33 AM 06/24/2023    6:08 AM 06/23/2023    7:33 PM  CBC  WBC 4.0 - 10.5 K/uL 7.7  8.5  16.9   Hemoglobin 12.0 - 15.0 g/dL 13.2  44.0  10.2   Hematocrit 36.0 - 46.0 % 34.7  36.9  48.2   Platelets 150 - 400 K/uL 198  215  228       Latest Ref Rng & Units 06/25/2023    6:33 AM 06/24/2023    6:08 AM 06/23/2023    7:33 PM  BMP  Glucose 70 -  99 mg/dL 87  409  811   BUN 8 - 23 mg/dL 12  19  22    Creatinine 0.44 - 1.00 mg/dL 9.14  7.82  9.56   Sodium 135 - 145 mmol/L 140  138  137   Potassium 3.5 - 5.1 mmol/L 3.6  3.3  3.6   Chloride 98 - 111 mmol/L 110  108  101   CO2 22 - 32 mmol/L 23  21  20    Calcium 8.9 - 10.3 mg/dL 8.4  8.0  9.3    No results found.  Family Communication: Discussed with patient, son over phone. They understand and agree. All questions answered.  Disposition: Status is: Observation The patient remains OBS appropriate and will d/c before 2 midnights.  Planned Discharge Destination: Home with Home Health     Time spent:  39 minutes  Author: Marcelino Duster, MD 06/26/2023 5:47 PM Secure chat 7am to 7pm For on call review [email or web address].

## 2023-06-27 DIAGNOSIS — A4150 Gram-negative sepsis, unspecified: Secondary | ICD-10-CM | POA: Diagnosis not present

## 2023-06-27 DIAGNOSIS — E876 Hypokalemia: Secondary | ICD-10-CM

## 2023-06-27 DIAGNOSIS — K219 Gastro-esophageal reflux disease without esophagitis: Secondary | ICD-10-CM | POA: Diagnosis not present

## 2023-06-27 DIAGNOSIS — Z96611 Presence of right artificial shoulder joint: Secondary | ICD-10-CM | POA: Diagnosis not present

## 2023-06-27 DIAGNOSIS — K5730 Diverticulosis of large intestine without perforation or abscess without bleeding: Secondary | ICD-10-CM | POA: Diagnosis not present

## 2023-06-27 DIAGNOSIS — N390 Urinary tract infection, site not specified: Secondary | ICD-10-CM | POA: Diagnosis not present

## 2023-06-27 DIAGNOSIS — R531 Weakness: Secondary | ICD-10-CM | POA: Diagnosis not present

## 2023-06-27 DIAGNOSIS — K529 Noninfective gastroenteritis and colitis, unspecified: Secondary | ICD-10-CM | POA: Diagnosis not present

## 2023-06-27 DIAGNOSIS — Z96612 Presence of left artificial shoulder joint: Secondary | ICD-10-CM | POA: Diagnosis not present

## 2023-06-27 DIAGNOSIS — B952 Enterococcus as the cause of diseases classified elsewhere: Secondary | ICD-10-CM | POA: Diagnosis not present

## 2023-06-27 DIAGNOSIS — G629 Polyneuropathy, unspecified: Secondary | ICD-10-CM | POA: Diagnosis not present

## 2023-06-27 DIAGNOSIS — J9611 Chronic respiratory failure with hypoxia: Secondary | ICD-10-CM | POA: Diagnosis not present

## 2023-06-27 DIAGNOSIS — Z7982 Long term (current) use of aspirin: Secondary | ICD-10-CM | POA: Diagnosis not present

## 2023-06-27 DIAGNOSIS — Z87891 Personal history of nicotine dependence: Secondary | ICD-10-CM | POA: Diagnosis not present

## 2023-06-27 DIAGNOSIS — A4159 Other Gram-negative sepsis: Secondary | ICD-10-CM | POA: Diagnosis not present

## 2023-06-27 DIAGNOSIS — J449 Chronic obstructive pulmonary disease, unspecified: Secondary | ICD-10-CM | POA: Diagnosis not present

## 2023-06-27 DIAGNOSIS — Z853 Personal history of malignant neoplasm of breast: Secondary | ICD-10-CM | POA: Diagnosis not present

## 2023-06-27 DIAGNOSIS — I10 Essential (primary) hypertension: Secondary | ICD-10-CM | POA: Diagnosis not present

## 2023-06-27 DIAGNOSIS — Z79899 Other long term (current) drug therapy: Secondary | ICD-10-CM | POA: Diagnosis not present

## 2023-06-27 MED ORDER — NITROFURANTOIN MONOHYD MACRO 100 MG PO CAPS: 100.0000 mg | ORAL_CAPSULE | Freq: Two times a day (BID) | ORAL | 0 refills | Status: AC

## 2023-06-27 NOTE — Plan of Care (Signed)
  Problem: Health Behavior/Discharge Planning: Goal: Ability to manage health-related needs will improve Outcome: Progressing   Problem: Clinical Measurements: Goal: Ability to maintain clinical measurements within normal limits will improve Outcome: Progressing   Problem: Nutrition: Goal: Adequate nutrition will be maintained Outcome: Progressing   Problem: Coping: Goal: Level of anxiety will decrease Outcome: Progressing   Problem: Pain Managment: Goal: General experience of comfort will improve and/or be controlled Outcome: Progressing

## 2023-06-27 NOTE — TOC Transition Note (Signed)
 Transition of Care [facility]) - Discharge Note   Patient Details  Name: Candace Tapia MRN: [id number] Date of Birth: May 17, 1935  Transition of Care [facility]) CM/SW Contact:  Erin Sons, LCSW Phone Number: 06/27/2023, 11:22 AM   Clinical Narrative:     CSW met with pt to discuss therapy rec for [facility] PT. Pt does not want HH or OP therapy. She states she uses a walker at home. Lives there alone. She does not express any additional needs. Pt's son on his way to transport her home.   Final next level of care: Home/Self Care Barriers to Discharge: No Barriers Identified   Patient Goals and CMS Choice Patient states their goals for this hospitalization and ongoing recovery are:: Go home                Discharge Plan and Services Additional resources added to the After Visit Summary for                                       Social Drivers of Health (SDOH) Interventions SDOH Screenings   Food Insecurity: No Food Insecurity (06/24/2023)  Housing: Low Risk  (06/24/2023)  Transportation Needs: No Transportation Needs (06/24/2023)  Utilities: Not At Risk (06/24/2023)  Alcohol Screen: Low Risk  (03/03/2023)  Depression (PHQ2-9): Medium Risk (04/17/2023)  Financial Resource Strain: Low Risk  (03/06/2023)   Received from [facility]  Physical Activity: Inactive (03/03/2023)  Social Connections: Socially Isolated (06/24/2023)  Stress: No Stress Concern Present (03/03/2023)  Tobacco Use: Medium Risk (06/24/2023)  Health Literacy: Adequate Health Literacy (03/03/2023)     Readmission Risk Interventions     No data to display

## 2023-06-27 NOTE — Discharge Summary (Signed)
 Physician Discharge Summary   Patient: Candace Tapia MRN: [id number] DOB: 03/02/1936  Admit date:     06/23/2023  Discharge date: 06/27/23  Discharge Physician: Marcelino Duster   PCP: Smitty Cords, DO   Recommendations at discharge:  {Tip this will not be part of the note when signed- Example include specific recommendations for outpatient follow-up, pending tests to follow-up on. (Optional):26781}  ***  Discharge Diagnoses: Principal Problem:   Sepsis due to gram-negative UTI (HCC) Active Problems:   Acute gastroenteritis   Essential hypertension   Peripheral neuropathy   GERD without esophagitis  Resolved Problems:   * No resolved hospital problems. [facility] Course: No notes on file  Assessment and Plan: * Sepsis due to gram-negative UTI (HCC) - This is manifested by tachypnea and leukocytosis. - The patient be admitted to a medical telemetry observation bed - Will continue hydration with IV lactated ringer. - Continue antibiotic therapy with IV cefepime.  She was initially started on broader spectrum antibiotic before urinanalysis results. - Follow blood and urine cultures.  Acute gastroenteritis - The patient better with IV lactated ringer. - As needed antiemetics will be provided. - We will follow stool studies and C. difficile.  Essential hypertension - We will continue antihypertensive therapy.  GERD without esophagitis - We will continue PPI therapy and hold off NSAIDs.  Peripheral neuropathy - Continue Neurontin.      {Tip this will not be part of the note when signed Body mass index is 36.78 kg/m. , ,  (Optional):26781}  {(NOTE) Pain control PDMP Statment (Optional):26782} Consultants: *** Procedures performed: ***  Disposition: {Plan; Disposition:26390} Diet recommendation:  Discharge Diet Orders (From admission, onward)     Start     Ordered   06/27/23 0000  Diet - low sodium heart healthy        06/27/23 1045            {Diet_Plan:26776} DISCHARGE MEDICATION: Allergies as of 06/27/2023       Reactions   Tramadol Hcl Itching   Per pt it caused me to itch all over   Statins    Not sure what exact med was; just made her feel bad (can't elaborate)   Penicillin G Rash   Penicillins Rash   Has patient had a PCN reaction causing immediate rash, facial/tongue/throat swelling, SOB or lightheadedness with hypotension: No Has patient had a PCN reaction causing severe rash involving mucus membranes or skin necrosis: No Has patient had a PCN reaction that required hospitalization: No Has patient had a PCN reaction occurring within the last 10 years: No If all of the above answers are "NO", then may proceed with Cephalosporin use.   Sulfa Antibiotics Other (See Comments), Rash   Other Reaction(s): Not available        Medication List     TAKE these medications    acetaminophen 650 MG CR tablet Commonly known as: TYLENOL Take 650 mg by mouth every 8 (eight) hours as needed for pain.   AeroChamber Plus inhaler Use as instructed   albuterol 108 (90 Base) MCG/ACT inhaler Commonly known as: VENTOLIN HFA Inhale 1 puff into the lungs every 6 (six) hours as needed.   amLODipine 5 MG tablet Commonly known as: NORVASC TAKE 1 TABLET(5 MG) BY MOUTH DAILY   aspirin 81 MG chewable tablet Chew 81 mg by mouth daily as needed.   chlorthalidone 25 MG tablet Commonly known as: HYGROTON Take 1 tablet (25 mg total) by mouth daily.  Cholecalciferol 25 MCG (1000 UT) capsule Vitamin D3 25 mcg (1,000 unit) capsule  1 po qd   dicyclomine 10 MG capsule Commonly known as: BENTYL Take 1 capsule (10 mg total) by mouth 3 (three) times daily before meals. Take as needed for abdominal bloating / cramping symptoms   escitalopram 10 MG tablet Commonly known as: Lexapro Take 1 tablet (10 mg total) by mouth daily.   fluticasone 50 MCG/ACT nasal spray Commonly known as: FLONASE Place 1 spray into both nostrils  as needed.   gabapentin 400 MG capsule Commonly known as: NEURONTIN TAKE 1 CAPSULE(400 MG) BY MOUTH TWICE DAILY   HYDROcodone-acetaminophen 5-325 MG tablet Commonly known as: NORCO/VICODIN Take 1 tablet by mouth 2 (two) times daily as needed.   losartan 100 MG tablet Commonly known as: COZAAR Take 1 tablet (100 mg total) by mouth daily.   magic mouthwash w/lidocaine Soln Swish, gargle, and spit one to two teaspoonfuls every six hours as needed. Shake well before using.   meclizine 25 MG tablet Commonly known as: ANTIVERT Take 0.5 tablets (12.5 mg total) by mouth every 8 (eight) hours as needed for dizziness.   meloxicam 7.5 MG tablet Commonly known as: MOBIC Take 7.5 mg by mouth 3 (three) times daily.   Multivitamin Women 50+ Tabs Take 1 tablet by mouth daily.   NexIUM 20 MG capsule Generic drug: esomeprazole Take 1 capsule (20 mg total) by mouth daily.   nitrofurantoin (macrocrystal-monohydrate) 100 MG capsule Commonly known as: MACROBID Take 1 capsule (100 mg total) by mouth every 12 (twelve) hours for 3 days.   ondansetron 4 MG disintegrating tablet Commonly known as: ZOFRAN-ODT Take 1 tablet (4 mg total) by mouth every 8 (eight) hours as needed for nausea or vomiting.   potassium chloride 10 MEQ CR capsule Commonly known as: MICRO-K TAKE 1 CAPSULE(10 MEQ) BY MOUTH DAILY. MAY OPEN CAPSULE SPRINKLE ON SOFT FOOD OR. SWALLOW WHOLE   PROBIOTIC DAILY PO Take by mouth.        Follow-up Information     Smitty Cords, DO Follow up.   Specialty: Family Medicine Why: Hospital follow up Contact information: [address] [place] [address] [phone]                Discharge Exam: Ceasar Mons Weights   06/23/23 1918 06/24/23 0123  Weight: 101.3 kg 97.2 kg   ***  Condition at discharge: {DC Condition:26389}  The results of significant diagnostics from this hospitalization (including imaging, microbiology, ancillary and laboratory) are listed  below for reference.   Imaging Studies: CT ABDOMEN PELVIS W CONTRAST Result Date: 06/23/2023 CLINICAL DATA:  Bowel obstruction, diarrhea, nausea EXAM: CT ABDOMEN AND PELVIS WITH CONTRAST TECHNIQUE: Multidetector CT imaging of the abdomen and pelvis was performed using the standard protocol following bolus administration of intravenous contrast. RADIATION DOSE REDUCTION: This exam was performed according to the departmental dose-optimization program which includes automated exposure control, adjustment of the mA and/or kV according to patient size and/or use of iterative reconstruction technique. CONTRAST:  80mL OMNIPAQUE IOHEXOL 300 MG/ML  SOLN COMPARISON:  08/26/2021 FINDINGS: Lower chest: Bibasilar cylindrical and cystic bronchiectasis with peripheral airway impaction seen within visualized lung bases, similar to prior examination. Mild superimposed peribronchial nodularity in keeping with bronchiolitis. No focal consolidation. Cardiac size within normal limits. Hepatobiliary: Cholelithiasis without pericholecystic inflammatory change noted. No enhancing intrahepatic mass. No intra or extrahepatic biliary ductal dilation. Pancreas: 2.4 x 2.1 cm cystic lesion has enlarged within the proximal body of the pancreas  at axial image # 30/2, previously measuring 1.2 cm. This is in keeping with a dilated pancreatic side branch, intrapancreatic pseudocyst, or cystic neoplasm. Mild parenchymal atrophy of the underlying pancreas. No superimposed peripancreatic inflammatory change. Spleen: Normal in size without focal abnormality. Adrenals/Urinary Tract: The adrenal glands are unremarkable. The kidneys are normal in size and position. Multiple simple cortical cysts are seen within kidneys bilaterally for which no follow-up imaging is recommended. The kidneys are otherwise unremarkable. Pelvic floor descent with a small cystocele noted. The bladder is otherwise unremarkable. Stomach/Bowel: Moderate descending and sigmoid  diverticulosis. Stomach, small bowel, and large bowel are otherwise unremarkable. Appendix normal. No evidence of obstruction or focal inflammation. No free intraperitoneal gas or fluid. Vascular/Lymphatic: Aortic atherosclerosis. No enlarged abdominal or pelvic lymph nodes. Reproductive: Status post hysterectomy. Mild middle compartment descent secondary to pelvic floor relaxation. 2.4 cm cystic lesion within the left ovary appears slightly decreased in size since prior examination where this measured 2.4 cm and is safely considered benign. Other: No abdominal wall hernia. Pelvic floor descent with mild posterior compartment prolapse. Musculoskeletal: Osseous structures are age-appropriate. No acute bone abnormality. No lytic or blastic lesion IMPRESSION: 1. No acute intra-abdominal pathology identified. No definite radiographic explanation for the patient's reported symptoms. 2. Bibasilar cylindrical and cystic bronchiectasis with peripheral airway impaction, similar to prior examination. Mild superimposed peribronchial nodularity in keeping with bronchiolitis. 3. Cholelithiasis. 4. Moderate descending and sigmoid diverticulosis without superimposed acute inflammatory change. 5. Pelvic floor descent with a small cystocele and mild middle and posterior compartment prolapse. 6. 2.4 cm cystic lesion within the proximal body of the pancreas, previously measuring 1.2 cm. This is in keeping with a dilated pancreatic side branch, intrapancreatic pseudocyst, or cystic neoplasm. If indicated, this could be better assessed with dedicated contrast enhanced MRI examination. Aortic Atherosclerosis (ICD10-I70.0). Electronically Signed   By: Helyn Numbers M.D.   On: 06/23/2023 23:09   US ABDOMEN LIMITED RUQ (LIVER/GB) Result Date: 06/23/2023 CLINICAL DATA:  Unspecified abdominal pain EXAM: ULTRASOUND ABDOMEN LIMITED RIGHT UPPER QUADRANT COMPARISON:  None Available. FINDINGS: Gallbladder: No gallstones or wall thickening  visualized. No sonographic Murphy sign noted by sonographer. Common bile duct: Diameter: 7 mm in proximal diameter Liver: Hepatic parenchymal echogenicity is diffusely increased in keeping with mild hepatic steatosis. No focal intrahepatic masses are seen and there is no intrahepatic biliary ductal dilation. portal vein is patent on color Doppler imaging with normal direction of blood flow towards the liver. Other: 1.6 x 1.9 x 2.4 cm simple cyst is seen subjacent to the left hepatic lobe which may represent an exophytic hepatic cyst, mesenteric cyst or pancreatic cyst/pseudocyst. IMPRESSION: 1. Mild hepatic steatosis. 2. 2.4 cm simple cyst subjacent to the left hepatic lobe which may represent an exophytic hepatic cyst, mesenteric cyst or pancreatic cyst/pseudocyst. This is better assessed on accompanying CT examination the abdomen pelvis. Electronically Signed   By: Helyn Numbers M.D.   On: 06/23/2023 22:58    Microbiology: Results for orders placed or performed during the hospital encounter of 06/23/23  Blood culture (routine x 2)     Status: None (Preliminary result)   Collection Time: 06/23/23  7:33 PM   Specimen: BLOOD  Result Value Ref Range Status   Specimen Description BLOOD RIGHT [facility]  Final   Special Requests   Final    BOTTLES DRAWN AEROBIC AND ANAEROBIC Blood Culture adequate volume   Culture   Final    NO GROWTH 4 DAYS Performed at [facility], [address] [place], [place] [address]    Report Status PENDING  Incomplete  Blood culture (routine x 2)     Status: None (Preliminary result)   Collection Time: 06/23/23 10:02 PM   Specimen: BLOOD  Result Value Ref Range Status   Specimen Description BLOOD RIGHT ARM  Final   Special Requests   Final    BOTTLES DRAWN AEROBIC AND ANAEROBIC Blood Culture results may not be optimal due to an inadequate volume of blood received in culture bottles   Culture   Final    NO GROWTH 4 DAYS Performed at [facility], [address] [place], [place] [address]    Report Status PENDING  Incomplete  Urine Culture (for pregnant, neutropenic or urologic patients or patients with an indwelling urinary catheter)     Status: Abnormal   Collection Time: 06/24/23  3:12 AM   Specimen: Urine, Catheterized  Result Value Ref Range Status   Specimen Description   Final    URINE, CATHETERIZED Performed at [facility], [address] [place], [place] [address]    Special Requests   Final    NONE Performed at [facility], [address] [place], [place] [address]    Culture >=100,000 COLONIES/mL ENTEROCOCCUS FAECALIS (A)  Final   Report Status 06/26/2023 FINAL  Final   Organism ID, Bacteria ENTEROCOCCUS FAECALIS (A)  Final      Susceptibility   Enterococcus faecalis - MIC*    AMPICILLIN <=2 SENSITIVE Sensitive     NITROFURANTOIN <=16 SENSITIVE Sensitive     VANCOMYCIN 1 SENSITIVE Sensitive     * >=100,000 COLONIES/mL ENTEROCOCCUS FAECALIS  Gastrointestinal Panel by PCR , Stool     Status: Abnormal   Collection Time: 06/24/23  7:46 AM   Specimen: Stool  Result Value Ref Range Status   Campylobacter species NOT DETECTED NOT DETECTED Final   Plesimonas shigelloides NOT DETECTED NOT DETECTED Final   Salmonella species NOT DETECTED NOT DETECTED Final   Yersinia enterocolitica NOT DETECTED NOT DETECTED Final   Vibrio species NOT DETECTED NOT DETECTED Final   Vibrio cholerae NOT DETECTED NOT DETECTED Final   Enteroaggregative E coli (EAEC) NOT DETECTED NOT DETECTED Final   Enteropathogenic E coli (EPEC) DETECTED (A) NOT DETECTED Final    Comment: RESULT CALLED TO, READ BACK BY AND VERIFIED WITH: ROBYN THOMAS AT 1203 06/24/23.PMF    Enterotoxigenic E coli (ETEC) NOT DETECTED NOT DETECTED Final   Shiga like toxin producing E coli (STEC) NOT DETECTED NOT DETECTED Final   Shigella/Enteroinvasive E coli (EIEC) NOT DETECTED NOT DETECTED Final   Cryptosporidium NOT DETECTED NOT DETECTED Final    Cyclospora cayetanensis NOT DETECTED NOT DETECTED Final   Entamoeba histolytica NOT DETECTED NOT DETECTED Final   Giardia lamblia NOT DETECTED NOT DETECTED Final   Adenovirus F40/41 NOT DETECTED NOT DETECTED Final   Astrovirus NOT DETECTED NOT DETECTED Final   Norovirus GI/GII NOT DETECTED NOT DETECTED Final   Rotavirus A NOT DETECTED NOT DETECTED Final   Sapovirus (I, II, IV, and V) NOT DETECTED NOT DETECTED Final    Comment: Performed at [facility], [address] [place], [place] [address]  C Difficile Quick Screen w PCR reflex     Status: None   Collection Time: 06/24/23  7:46 AM   Specimen: Stool  Result Value Ref Range Status   C Diff antigen NEGATIVE NEGATIVE Final   C Diff toxin NEGATIVE NEGATIVE Final   C Diff interpretation No C. difficile  detected.  Final    Comment: Performed at [facility], [address] [place], [place] [address]  MRSA Next Gen by PCR, Nasal     Status: None   Collection Time: 06/24/23  2:01 PM   Specimen: Nasal Mucosa; Nasal Swab  Result Value Ref Range Status   MRSA by PCR Next Gen NOT DETECTED NOT DETECTED Final    Comment: (NOTE) The GeneXpert MRSA Assay (FDA approved for NASAL specimens only), is one component of a comprehensive MRSA colonization surveillance program. It is not intended to diagnose MRSA infection nor to guide or monitor treatment for MRSA infections. Test performance is not FDA approved in patients less than 7 years old. Performed at [facility], [address] [place], [place] [address]     Labs: CBC: Recent Labs  Lab 06/23/23 1933 06/24/23 [phone] 06/25/23 0633  WBC 16.9* 8.5 7.7  NEUTROABS 14.9*  --   --   HGB 15.1* 11.9* 11.2*  HCT 48.2* 36.9 34.7*  MCV 92.0 91.3 89.4  PLT 228 215 198   Basic Metabolic Panel: Recent Labs  Lab 06/23/23 1933 06/24/23 0608 06/25/23 0633  NA 137 138 140  K 3.6 3.3* 3.6  CL 101 108 110  CO2 20* 21* 23  GLUCOSE 181* 102* 87  BUN 22 19 12    CREATININE 1.23* 0.87 0.80  CALCIUM 9.3 8.0* 8.4*  MG  --  1.8  --    Liver Function Tests: Recent Labs  Lab 06/23/23 1933  AST 22  ALT 9  ALKPHOS 65  BILITOT 0.8  PROT 7.4  ALBUMIN 4.1   CBG: No results for input(s): "GLUCAP" in the last 168 hours.  Discharge time spent: {LESS THAN/GREATER UVOZ:36644} 30 minutes.  Signed: Marcelino Duster, MD [facility] 06/27/2023

## 2023-06-28 ENCOUNTER — Inpatient Hospital Stay: Admitting: Family Medicine

## 2023-06-28 LAB — CULTURE, BLOOD (ROUTINE X 2)
Culture: NO GROWTH
Culture: NO GROWTH
Special Requests: ADEQUATE

## 2023-06-30 ENCOUNTER — Encounter: Payer: Self-pay | Admitting: Family Medicine

## 2023-06-30 ENCOUNTER — Inpatient Hospital Stay: Admitting: Internal Medicine

## 2023-06-30 ENCOUNTER — Ambulatory Visit: Admitting: Family Medicine

## 2023-06-30 VITALS — BP 128/64 | HR 95

## 2023-06-30 DIAGNOSIS — R051 Acute cough: Secondary | ICD-10-CM

## 2023-06-30 DIAGNOSIS — J101 Influenza due to other identified influenza virus with other respiratory manifestations: Secondary | ICD-10-CM

## 2023-06-30 DIAGNOSIS — R0602 Shortness of breath: Secondary | ICD-10-CM | POA: Diagnosis not present

## 2023-06-30 LAB — POC COVID19/FLU A&B COMBO
Covid Antigen, POC: NEGATIVE
Influenza A Antigen, POC: POSITIVE — AB
Influenza B Antigen, POC: NEGATIVE

## 2023-06-30 MED ORDER — OSELTAMIVIR PHOSPHATE 75 MG PO CAPS: 75.0000 mg | ORAL_CAPSULE | Freq: Two times a day (BID) | ORAL | 0 refills | Status: DC

## 2023-06-30 MED ORDER — PREDNISONE 50 MG PO TABS: 50.0000 mg | ORAL_TABLET | Freq: Every day | ORAL | 0 refills | Status: DC

## 2023-06-30 NOTE — Progress Notes (Signed)
 Subjective:    Patient ID: Candace Tapia, female    DOB: 1935-06-06, 88 y.o.   MRN: [id number]  Candace Tapia is a 88 y.o. female presenting on 06/30/2023 for Influenza   HPI   Discussed the use of AI scribe software for clinical note transcription with the patient, who gave verbal consent to proceed.  History of Present Illness   Candace Tapia is an 88 year old female who presents for follow-up after hospitalization for a urinary tract infection and sepsis.     HOSPITAL FOLLOW-UP VISIT  Hospital/Location: [facility] Date of Admission: 06/23/23 Date of Discharge: 06/27/23 Transitions of care telephone call: Not completed  Reason for Admission: UTI / Sepsis  - Hospital H&P and Discharge Summary have been reviewed - Patient presents today 3 days after recent hospitalization.  She was recently discharged from the hospital following treatment for a urinary tract infection (UTI) and sepsis. Initially, she felt better but soon experienced increased coughing, congestion, and persistent diarrhea. She has not vomited but feels generally unwell. During her hospital stay, she was treated with antibiotics, including Macrobid, which she completed this morning. Despite completing the course, her symptoms worsened before the antibiotics ran out. A CT and ultrasound were performed to rule out abdominal issues, and no bloodstream infection was found. She was discharged with home health support.  She has been experiencing significant respiratory symptoms, including coughing and difficulty breathing, which have persisted since her discharge. She uses a rescue inhaler and takes cough and congestion medicine, which helps reduce coughing but causes drowsiness. She uses supplemental oxygen at night, as was done in the hospital, but has not been able to check her oxygen saturation at home.  Her diarrhea has been ongoing since her hospital admission, leading to low energy and poor nutrition. She has not  been eating well, contributing to her weakness. Her short-term memory has also been affected since the UTI.  The recent UTI was initially severe enough to be considered sepsis, but there was no bloodstream infection. The current symptoms are not consistent with a UTI recurrence.  - New medications on discharge: Macrobid - Changes to current meds on discharge: None  I have reviewed the discharge medication list, and have reconciled the current and discharge medications today.   Current Outpatient Medications:    oseltamivir (TAMIFLU) 75 MG capsule, Take 1 capsule (75 mg total) by mouth 2 (two) times daily. For 5 days, Disp: 10 capsule, Rfl: 0   predniSONE (DELTASONE) 50 MG tablet, Take 1 tablet (50 mg total) by mouth daily with breakfast., Disp: 5 tablet, Rfl: 0   acetaminophen (TYLENOL) 650 MG CR tablet, Take 650 mg by mouth every 8 (eight) hours as needed for pain., Disp: , Rfl:    albuterol (VENTOLIN HFA) 108 (90 Base) MCG/ACT inhaler, Inhale 1 puff into the lungs every 6 (six) hours as needed. , Disp: , Rfl:    amLODipine (NORVASC) 5 MG tablet, TAKE 1 TABLET(5 MG) BY MOUTH DAILY, Disp: 90 tablet, Rfl: 3   aspirin 81 MG chewable tablet, Chew 81 mg by mouth daily as needed., Disp: , Rfl:    chlorthalidone (HYGROTON) 25 MG tablet, Take 1 tablet (25 mg total) by mouth daily., Disp: 90 tablet, Rfl: 3   Cholecalciferol 25 MCG (1000 UT) capsule, Vitamin D3 25 mcg (1,000 unit) capsule  1 po qd, Disp: , Rfl:    dicyclomine (BENTYL) 10 MG capsule, Take 1 capsule (10 mg total) by mouth 3 (three) times daily  before meals. Take as needed for abdominal bloating / cramping symptoms (Patient not taking: Reported on 06/27/2023), Disp: 90 capsule, Rfl: 2   escitalopram (LEXAPRO) 10 MG tablet, Take 1 tablet (10 mg total) by mouth daily., Disp: 30 tablet, Rfl: 2   fluticasone (FLONASE) 50 MCG/ACT nasal spray, Place 1 spray into both nostrils as needed. , Disp: , Rfl:    gabapentin (NEURONTIN) 400 MG capsule,  TAKE 1 CAPSULE(400 MG) BY MOUTH TWICE DAILY, Disp: 180 capsule, Rfl: 0   HYDROcodone-acetaminophen (NORCO/VICODIN) 5-325 MG tablet, Take 1 tablet by mouth 2 (two) times daily as needed., Disp: , Rfl:    losartan (COZAAR) 100 MG tablet, Take 1 tablet (100 mg total) by mouth daily., Disp: 90 tablet, Rfl: 3   magic mouthwash w/lidocaine SOLN, Swish, gargle, and spit one to two teaspoonfuls every six hours as needed. Shake well before using., Disp: 120 mL, Rfl: 0   meclizine (ANTIVERT) 25 MG tablet, Take 0.5 tablets (12.5 mg total) by mouth every 8 (eight) hours as needed for dizziness. (Patient not taking: Reported on 06/27/2023), Disp: 90 tablet, Rfl: 3   meloxicam (MOBIC) 7.5 MG tablet, Take 7.5 mg by mouth 3 (three) times daily., Disp: , Rfl:    Multiple Vitamins-Minerals (MULTIVITAMIN WOMEN 50+) TABS, Take 1 tablet by mouth daily. , Disp: , Rfl:    NEXIUM 20 MG capsule, Take 1 capsule (20 mg total) by mouth daily. (Patient not taking: Reported on 06/27/2023), Disp: 90 capsule, Rfl: 3   nitrofurantoin, macrocrystal-monohydrate, (MACROBID) 100 MG capsule, Take 1 capsule (100 mg total) by mouth every 12 (twelve) hours for 3 days., Disp: 6 capsule, Rfl: 0   ondansetron (ZOFRAN-ODT) 4 MG disintegrating tablet, Take 1 tablet (4 mg total) by mouth every 8 (eight) hours as needed for nausea or vomiting. (Patient not taking: Reported on 06/27/2023), Disp: 30 tablet, Rfl: 2   potassium chloride (MICRO-K) 10 MEQ CR capsule, TAKE 1 CAPSULE(10 MEQ) BY MOUTH DAILY. MAY OPEN CAPSULE SPRINKLE ON SOFT FOOD OR. SWALLOW WHOLE, Disp: 90 capsule, Rfl: 1   Probiotic Product (PROBIOTIC DAILY PO), Take by mouth., Disp: , Rfl:    Spacer/Aero-Holding Chambers (AEROCHAMBER PLUS) inhaler, Use as instructed, Disp: 1 each, Rfl: 2  ------------------------------------------------------------------------- Social History   Tobacco Use   Smoking status: Former    Current packs/day: 0.00    Average packs/day: 2.0 packs/day for 40.0  years (80.0 ttl pk-yrs)    Types: Cigarettes    Start date: 04/19/1953    Quit date: 04/19/1993    Years since quitting: 30.2   Smokeless tobacco: Never  Substance Use Topics   Alcohol use: No    Alcohol/week: 0.0 standard drinks of alcohol   Drug use: No    Review of Systems Per HPI unless specifically indicated above     Objective:    BP 128/64 (BP Location: Left Arm, Patient Position: Sitting, Cuff Size: Normal)   Pulse 95   SpO2 91%   Wt Readings from Last 3 Encounters:  06/24/23 214 lb 4.6 oz (97.2 kg)  04/17/23 209 lb (94.8 kg)  03/07/23 211 lb (95.7 kg)    Physical Exam Vitals and nursing note reviewed.  Constitutional:      General: She is not in acute distress.    Appearance: She is well-developed. She is not diaphoretic.     Comments: Elderly female 39 yr, currently ill and tired appearing, increased respiratory effort, uncomfortable, cooperative  HENT:     Head: Normocephalic and atraumatic.  Eyes:  General:        Right eye: No discharge.        Left eye: No discharge.     Conjunctiva/sclera: Conjunctivae normal.  Neck:     Thyroid: No thyromegaly.  Cardiovascular:     Rate and Rhythm: Normal rate and regular rhythm.     Heart sounds: Normal heart sounds. No murmur heard. Pulmonary:     Effort: No respiratory distress.     Breath sounds: No wheezing or rales.     Comments: Reduced breath sounds. Inc work of breathing. Cough. Musculoskeletal:        General: Normal range of motion.     Cervical back: Normal range of motion and neck supple.  Lymphadenopathy:     Cervical: No cervical adenopathy.  Skin:    General: Skin is warm and dry.     Findings: No erythema or rash.  Neurological:     Mental Status: She is alert and oriented to person, place, and time.  Psychiatric:        Behavior: Behavior normal.     Comments: Well groomed, good eye contact, normal speech and thoughts    Results for orders placed or performed in visit on 06/30/23  POC  Covid19/Flu A&B Antigen   Collection Time: 06/30/23  4:22 PM  Result Value Ref Range   Influenza A Antigen, POC Positive (A) Negative   Influenza B Antigen, POC Negative Negative   Covid Antigen, POC Negative Negative      Assessment & Plan:   Problem List Items Addressed This Visit   None Visit Diagnoses       Influenza A    -  Primary   Relevant Medications   oseltamivir (TAMIFLU) 75 MG capsule     Shortness of breath       Relevant Medications   predniSONE (DELTASONE) 50 MG tablet   Other Relevant Orders   POC Covid19/Flu A&B Antigen (Completed)     Acute cough       Relevant Medications   predniSONE (DELTASONE) 50 MG tablet   Other Relevant Orders   POC Covid19/Flu A&B Antigen (Completed)       Influenza A Diagnosed with influenza post-hospital discharge despite vaccination, likely due to hospital exposure.  Pulse ox 91% on room air, she has oxygen at home.  - Prescribe Tamiflu 75 mg twice daily for 5 days. - Prescribe prednisone 50 mg daily with meals for 5 days. Pause NSAID - Continue using rescue Albuterol inhaler as needed. - Use cough medicine as needed for symptom relief. - Administer oxygen PRN for shortness of breath. And nighttime as per usual - Encourage fluid intake and rest.  Urinary Tract Infection (UTI) UTI treated with Macrobid, no current symptoms indicating recurrence. No further antibiotic today  Follow-up required if symptoms do not improve, especially respiratory. - If not breathing better by Monday, order chest x-ray to rule out pneumonia. Note we do not have X-ray on site Friday, offer to go to other location but will plan for next week if not improved. - Call the office to arrange for an x-ray if symptoms persist.   Return precautions given if severe worsening breathing or low oxygen or new concerns, seek care again at hospital over weekend.        Meds ordered this encounter  Medications   oseltamivir (TAMIFLU) 75 MG capsule     Sig: Take 1 capsule (75 mg total) by mouth 2 (two) times daily. For 5 days  Dispense:  10 capsule    Refill:  0   predniSONE (DELTASONE) 50 MG tablet    Sig: Take 1 tablet (50 mg total) by mouth daily with breakfast.    Dispense:  5 tablet    Refill:  0    Follow up plan: Return if symptoms worsen or fail to improve.    Saralyn Pilar, DO [facility] 06/30/2023, 7:04 PM

## 2023-06-30 NOTE — Patient Instructions (Addendum)
 Thank you for coming to the office today.   1. You tested positive for Influenza A today (rapid flu swab test in office)  - Start Flu medicine today Tamiflu (anti-flu medicine) take one capsule 75mg  twice a day for 5 days   Prednisone 50mg  daily with meal  Use rescue inhaler  - For symptom control (these are optional OTC medicines)     - May try OTC Mucinex up to 7-10 days then stop  Cough syrup as needed   - Wash hands and cover cough very well to avoid spread of infection - Improve hydration with plenty of clear fluids  IF NOT IMPROVED BY MONDAY CALL AND WE CAN ORDER CHEST XRAY   If significant worsening with poor fluid intake, worsening fever, difficulty breathing due to coughing, worsening body aches, weakness, or other more concerning symptoms difficulty breathing you can seek treatment at Emergency Department. Also if improved flu symptoms and then worsening days to week later with concerns for bronchitis, productive cough fever chills again we may need to check for possible pneumonia that can occur after the flu     Please schedule a Follow-up Appointment to: Return if symptoms worsen or fail to improve.  If you have any other questions or concerns, please feel free to call the office or send a message through MyChart. You may also schedule an earlier appointment if necessary.  Additionally, you may be receiving a survey about your experience at our office within a few days to 1 week by e-mail or mail. We value your feedback.  Saralyn Pilar, DO [facility], [place]

## 2023-07-03 ENCOUNTER — Inpatient Hospital Stay: Admitting: Family Medicine

## 2023-07-03 ENCOUNTER — Telehealth: Payer: Self-pay

## 2023-07-03 DIAGNOSIS — G9331 Postviral fatigue syndrome: Secondary | ICD-10-CM

## 2023-07-03 DIAGNOSIS — N3001 Acute cystitis with hematuria: Secondary | ICD-10-CM

## 2023-07-03 MED ORDER — LEVOFLOXACIN 500 MG PO TABS: 500.0000 mg | ORAL_TABLET | Freq: Every day | ORAL | 0 refills | Status: DC

## 2023-07-03 NOTE — Telephone Encounter (Signed)
 Copied from CRM [id number]. Topic: General - Other >> Jul 03, 2023  2:01 PM Truddie Crumble wrote: Reason for CRM: patient son called stating the patient UTI may not have went away so they need another antibiotic

## 2023-07-03 NOTE — Telephone Encounter (Signed)
 Called patient and spoke with son Loraine Leriche. He says she is improved from Flu standpoint. Tamiflu steroid helped. Still question if over UTI, still cognitive and confusion at times. Question urinary symptoms. Previously already treated for urosepsis at hospitalization. Reviewed urine culture pan sensitive.   I offered empiric antibiotic now at their request or come in for urine sample and then order. He said too difficult to bring her in and unsure if could even do that tomorrow. So I agreed to order empiric levaquin   Start taking Levaquin antibiotic 500mg  daily x 7 days - this can cover both respiratory and urinary possibilities.  Follow up if not improving or new concerns, if severe worse seek care immediately advised.   Saralyn Pilar, DO [facility] 07/03/2023, 4:49 PM

## 2023-07-10 ENCOUNTER — Other Ambulatory Visit: Payer: Self-pay | Admitting: Family Medicine

## 2023-07-10 DIAGNOSIS — F419 Anxiety disorder, unspecified: Secondary | ICD-10-CM

## 2023-07-10 DIAGNOSIS — F3342 Major depressive disorder, recurrent, in full remission: Secondary | ICD-10-CM

## 2023-07-11 NOTE — Telephone Encounter (Signed)
 Requested Prescriptions  Pending Prescriptions Disp Refills   escitalopram (LEXAPRO) 10 MG tablet [Pharmacy Med Name: ESCITALOPRAM 10MG  TABLETS] 90 tablet 0    Sig: TAKE 1 TABLET(10 MG) BY MOUTH DAILY     Psychiatry:  Antidepressants - SSRI Passed - 07/11/2023  3:00 PM      Passed - Completed PHQ-2 or PHQ-9 in the last 360 days      Passed - Valid encounter within last 6 months    Recent Outpatient Visits           2 months ago Anxiety   [place] [facility] [place], Netta Neat, DO   4 months ago Benign hypertension with CKD (chronic kidney disease) stage III [facility])   [place] [facility] [place], Netta Neat, DO   4 months ago Epigastric abdominal pain   [place] [facility] Smitty Cords, DO   10 months ago Benign hypertension with CKD (chronic kidney disease), stage II   [place] [facility] Smitty Cords, DO   1 year ago Diarrhea, unspecified type   [place] [facility] [place], Netta Neat, DO       Future Appointments             In 1 month Althea Charon, Netta Neat, DO  [facility], [facility]

## 2023-07-18 ENCOUNTER — Ambulatory Visit: Payer: Self-pay

## 2023-07-18 NOTE — Telephone Encounter (Signed)
 Chief Complaint: altered mental status Symptoms: confusion and forgetfulness Frequency: ongoing since hospitalization Pertinent Negatives: Patient denies fever Disposition: [] ED /[] Urgent Care (no appt availability in office) / [x] Appointment(In office/virtual)/ []  [place] [facility]/ [] Home Care/ [] Refused Recommended Disposition /[] [place] [facility]/ []  Follow-up with PCP Additional Notes: Pt son states that she has been really confused since she was diagnosed with that UTI. States she is very forgetful and confused and he is unsure if this is normal. States that she sleeps a lot and forgets her medications.   Copied from CRM [id number]. Topic: Clinical - Red Word Triage >> Jul 18, 2023  2:41 PM Tiffany S wrote: [facility] that prompted transfer to Nurse Triage: Altered mental status from the UTI confused a lot Reason for Disposition  [1] Longstanding confusion (e.g., dementia, stroke) AND [2] NO worsening or change  Answer Assessment - Initial Assessment Questions 1. LEVEL OF CONSCIOUSNESS: "How is he (she, the patient) acting right now?" (e.g., alert-oriented, confused, lethargic, stuporous, comatose)     Confused and forgetful 2. ONSET: "When did the confusion start?"  (minutes, hours, days)     Since her hospitalization 3. PATTERN "Does this come and go, or has it been constant since it started?"  "Is it present now?"     constant 4. ALCOHOL or DRUGS: "Has he been drinking alcohol or taking any drugs?"      no 5. NARCOTIC MEDICINES: "Has he been receiving any narcotic medications?" (e.g., morphine, Vicodin)     hydrocodone 6. CAUSE: "What do you think is causing the confusion?"      Unknow  7. OTHER SYMPTOMS: "Are there any other symptoms?" (e.g., difficulty breathing, headache, fever, weakness)     Sleeps a lots  Protocols used: Confusion - Delirium-A-AH

## 2023-07-21 ENCOUNTER — Encounter: Payer: Self-pay | Admitting: Family Medicine

## 2023-07-21 ENCOUNTER — Ambulatory Visit: Admitting: Family Medicine

## 2023-07-21 VITALS — BP 130/60 | HR 101 | Ht 64.0 in

## 2023-07-21 DIAGNOSIS — N3000 Acute cystitis without hematuria: Secondary | ICD-10-CM

## 2023-07-21 DIAGNOSIS — G3184 Mild cognitive impairment, so stated: Secondary | ICD-10-CM | POA: Diagnosis not present

## 2023-07-21 DIAGNOSIS — R413 Other amnesia: Secondary | ICD-10-CM

## 2023-07-21 LAB — POCT URINALYSIS DIPSTICK
Glucose, UA: NEGATIVE
Ketones, UA: NEGATIVE
Nitrite, UA: NEGATIVE
Protein, UA: POSITIVE — AB
Spec Grav, UA: 1.025 (ref 1.010–1.025)
Urobilinogen, UA: 0.2 U/dL
pH, UA: 5 (ref 5.0–8.0)

## 2023-07-21 MED ORDER — FOSFOMYCIN TROMETHAMINE 3 G PO PACK: 3.0000 g | PACK | Freq: Once | ORAL | 0 refills | Status: AC

## 2023-07-21 NOTE — Patient Instructions (Addendum)
 Thank you for coming to the office today.  Possible UTI still causing confusion and memory issues 1 dose Fosfomycin 3-4 oz cool water If not improving contact us back. Consider referral to Neurology  Please schedule a Follow-up Appointment to: Return if symptoms worsen or fail to improve.  If you have any other questions or concerns, please feel free to call the office or send a message through MyChart. You may also schedule an earlier appointment if necessary.  Additionally, you may be receiving a survey about your experience at our office within a few days to 1 week by e-mail or mail. We value your feedback.  Saralyn Pilar, DO [facility], [place]

## 2023-07-21 NOTE — Progress Notes (Signed)
 Subjective:    Patient ID: Candace Tapia, female    DOB: 1935/11/02, 88 y.o.   MRN: [id number]  Candace Tapia is a 88 y.o. female presenting on 07/21/2023 for Urinary Tract Infection and Altered Mental Status (Confusion / cognitive decline)  Patient presents for a same day appointment.  HPI  Discussed the use of AI scribe software for clinical note transcription with the patient, who gave verbal consent to proceed.  History of Present Illness   Candace Tapia is a 88 year old female who presents with confusion and memory issues. She is accompanied by her son / caregiver, who is concerned about her memory issues and medication adherence.  She has experienced worsening confusion / short term memory since hospitalized early March 06/23/23 with Urosepsis and then saw me for influenza on 06/30/23  Her caregiver notes that her memory was not optimal prior to the UTI, but it significantly declined after the infection. She has been sleeping more than usual and is very forgetful, often forgetting things within seconds. There is concern about the potential for another UTI and its impact on her health.  She has completely resolved from recent influenza with tamiflu and steroid course. No further respiratory complaints.   She was previously prescribed Levaquin for the flu and UTI, but she did not take it due to risk gastrointestinal side effects, including severe stomach upset and frequent bathroom visits. The medication was eventually discarded. Her caregiver manages her medications using a pillbox, but there have been issues with her taking hydrocodone correctly. She takes hydrocodone twice daily, but there was an incident where she took an extra dose, leading her caregiver to administer the medication directly. Her caregiver expresses concern about her ability to manage her medications independently due to her memory issues.  No current urinary symptoms such as burning or increased frequency.  There is concern about a recurrent infection contributing to her cognitive decline.  No changes in her breathing, although she has a history of COPD and experiences shortness of breath regularly.         04/17/2023    1:46 PM 02/24/2023   10:05 AM 08/24/2022    2:40 PM  Depression screen PHQ 2/9  Decreased Interest 2 1 1   Down, Depressed, Hopeless 2 1 1   PHQ - 2 Score 4 2 2   Altered sleeping 1 0 0  Tired, decreased energy 2 2 1   Change in appetite 2 1 1   Feeling bad or failure about yourself  0 0 0  Trouble concentrating 1 0 0  Moving slowly or fidgety/restless 0 1 1  Suicidal thoughts 0 1 0  PHQ-9 Score 10 7 5   Difficult doing work/chores Somewhat difficult Not difficult at all        04/17/2023    1:47 PM 02/24/2023   10:05 AM 08/24/2022    2:40 PM 02/23/2022    2:01 PM  GAD 7 : Generalized Anxiety Score  Nervous, Anxious, on Edge 2 0 0 0  Control/stop worrying 1 0 0 0  Worry too much - different things 0 0 0 0  Trouble relaxing 1 0 0 0  Restless 0 0 0 0  Easily annoyed or irritable 0 0 1 0  Afraid - awful might happen 0 0 0 0  Total GAD 7 Score 4 0 1 0  Anxiety Difficulty Somewhat difficult   Not difficult at all    Social History   Tobacco Use   Smoking status: Former  Current packs/day: 0.00    Average packs/day: 2.0 packs/day for 40.0 years (80.0 ttl pk-yrs)    Types: Cigarettes    Start date: 04/19/1953    Quit date: 04/19/1993    Years since quitting: 30.2   Smokeless tobacco: Never  Substance Use Topics   Alcohol use: No    Alcohol/week: 0.0 standard drinks of alcohol   Drug use: No    Review of Systems Per HPI unless specifically indicated above     Objective:    BP 130/60 (BP Location: Right Arm, Patient Position: Sitting, Cuff Size: Normal)   Pulse (!) 101   Ht 5\' 4"  (1.626 m)   SpO2 95%   BMI 36.78 kg/m   Wt Readings from Last 3 Encounters:  06/24/23 214 lb 4.6 oz (97.2 kg)  04/17/23 209 lb (94.8 kg)  03/07/23 211 lb (95.7 kg)     Physical Exam Vitals and nursing note reviewed.  Constitutional:      General: She is not in acute distress.    Appearance: Normal appearance. She is well-developed. She is not diaphoretic.     Comments: Well-appearing, comfortable, cooperative  HENT:     Head: Normocephalic and atraumatic.  Eyes:     General:        Right eye: No discharge.        Left eye: No discharge.     Conjunctiva/sclera: Conjunctivae normal.  Cardiovascular:     Rate and Rhythm: Normal rate.  Pulmonary:     Effort: Pulmonary effort is normal.  Musculoskeletal:     Comments: In wheelchair  Skin:    General: Skin is warm and dry.     Findings: No erythema or rash.  Neurological:     Mental Status: She is alert and oriented to person, place, and time.     Comments: Limited short term recall. Follows conversation and active and contributing to history.  Psychiatric:        Mood and Affect: Mood normal.        Behavior: Behavior normal.        Thought Content: Thought content normal.     Comments: Well groomed, good eye contact, normal speech and thoughts       Status: Final result     Next appt: 09/01/2023 at 03:00 PM in [facility] (Iline Oven, RN)   Test Result Released: No (inaccessible in MyChart)   Specimen Information: Urine, Catheterized  0 Result Notes    Component Ref Range & Units (hover) 3 wk ago  Specimen Description URINE, CATHETERIZED Performed at [facility], [address] [place], [place] [address]  Special Requests NONE Performed at [facility], [address] [place], [place] [address]  Culture >=100,000 COLONIES/mL ENTEROCOCCUS FAECALIS Abnormal   Report Status 06/26/2023 FINAL  Organism ID, Bacteria ENTEROCOCCUS FAECALIS Abnormal   Resulting Agency [facility]     Susceptibility   Enterococcus faecalis    MIC    AMPICILLIN <=2 SENSITIVE Sensitive    NITROFURANTOIN <=16 SENSIT... Sensitive    VANCOMYCIN 1 SENSITIVE Sensitive            Susceptibility Comments  Enterococcus faecalis  >=100,000 COLONIES/mL ENTEROCOCCUS FAECALIS     Specimen Collected: 06/24/23 03:12 Last Resulted: 06/26/23 09:11    Results for orders placed or performed in visit on 06/30/23  POC Covid19/Flu A&B Antigen   Collection Time: 06/30/23  4:22 PM  Result Value Ref Range   Influenza A Antigen, POC Positive (A)  Negative   Influenza B Antigen, POC Negative Negative   Covid Antigen, POC Negative Negative      Assessment & Plan:   Problem List Items Addressed This Visit   None Visit Diagnoses       Acute cystitis without hematuria    -  Primary   Relevant Medications   fosfomycin (MONUROL) 3 g PACK     Mild cognitive impairment            Urinary Tract Infection (UTI) Recurrent UTI potentially contributing to confusion and memory issues. Allergic to penicillin and sulfa drugs. Fosfomycin discussed as treatment option due Enterococcus Faecalis UTI on prior culture  - Order fosfomycin for UTI treatment. - Encourage use of probiotics to mitigate gastrointestinal side effects. - Attempt to obtain a urine sample for culture.  Cognitive Decline Worsening short-term memory and confusion following UTI. Potential link between infections and cognitive decline, especially in elderly with hospitalization / sepsis and influenza after.  Discussed possibility of cognitive improvement with UTI resolution. - Monitor cognitive symptoms. - Consider referral to neurology if cognitive issues persist. Note she has had some gradual decline, but this seems to represent recent worsening short term memory and dysfunction - Consider brain imaging if necessary.  Chronic Obstructive Pulmonary Disease (COPD) S/p influenza treatment now resolved COPD with baseline shortness of breath. Recent respiratory symptoms resolved, lung function returned to baseline. - Follow up with pulmonologist as scheduled.  General Health Maintenance Ongoing health  maintenance needs at 88 years old. Importance of medication management discussed. - Ensure proper medication management using a pillbox. - Provide assistance with medication administration as needed.  Follow-up Upcoming appointments with pain clinic and pulmonologist. Reassessment of cognitive and urinary symptoms planned in one week. - Follow up with pain clinic next week. - Follow up with pulmonologist after pain clinic appointment. - Reassess cognitive and urinary symptoms in one week.        No orders of the defined types were placed in this encounter.   Meds ordered this encounter  Medications   fosfomycin (MONUROL) 3 g PACK    Sig: Take 3 g by mouth once for 1 dose.    Dispense:  3 g    Refill:  0    Follow up plan: No follow-ups on file.   Saralyn Pilar, DO [facility] 07/21/2023, 1:53 PM

## 2023-07-23 LAB — URINE CULTURE
MICRO NUMBER:: 16291688
SPECIMEN QUALITY:: ADEQUATE

## 2023-07-27 DIAGNOSIS — G8929 Other chronic pain: Secondary | ICD-10-CM | POA: Diagnosis not present

## 2023-07-27 DIAGNOSIS — M47896 Other spondylosis, lumbar region: Secondary | ICD-10-CM | POA: Diagnosis not present

## 2023-07-27 DIAGNOSIS — M48061 Spinal stenosis, lumbar region without neurogenic claudication: Secondary | ICD-10-CM | POA: Diagnosis not present

## 2023-07-27 DIAGNOSIS — Z79891 Long term (current) use of opiate analgesic: Secondary | ICD-10-CM | POA: Diagnosis not present

## 2023-08-09 ENCOUNTER — Other Ambulatory Visit: Payer: Self-pay | Admitting: Family Medicine

## 2023-08-09 DIAGNOSIS — G629 Polyneuropathy, unspecified: Secondary | ICD-10-CM

## 2023-08-09 NOTE — Telephone Encounter (Signed)
 Requested Prescriptions  Pending Prescriptions Disp Refills   gabapentin  (NEURONTIN ) 400 MG capsule [Pharmacy Med Name: GABAPENTIN  400MG  CAPSULES] 180 capsule 0    Sig: TAKE 1 CAPSULE(400 MG) BY MOUTH TWICE DAILY     Neurology: Anticonvulsants - gabapentin  Failed - 08/09/2023  5:35 PM      Failed - Valid encounter within last 12 months    Recent Outpatient Visits           2 weeks ago Acute cystitis without hematuria   [place] [facility] Raina Bunting, DO   1 month ago Influenza A   [place] [facility] Raina Bunting, DO       Future Appointments             In 3 weeks [place], Kayleen Party, DO [place] [facility]            Passed - Cr in normal range and within 360 days    Creat  Date Value Ref Range Status  02/24/2023 0.99 (H) 0.60 - 0.95 mg/dL Final   Creatinine, Ser  Date Value Ref Range Status  06/25/2023 0.80 0.44 - 1.00 mg/dL Final         Passed - Completed PHQ-2 or PHQ-9 in the last 360 days

## 2023-08-23 ENCOUNTER — Ambulatory Visit: Admitting: Podiatry

## 2023-08-23 ENCOUNTER — Encounter: Payer: Self-pay | Admitting: Podiatry

## 2023-08-23 DIAGNOSIS — L84 Corns and callosities: Secondary | ICD-10-CM

## 2023-08-23 DIAGNOSIS — M7742 Metatarsalgia, left foot: Secondary | ICD-10-CM | POA: Diagnosis not present

## 2023-08-23 NOTE — Patient Instructions (Signed)
 Look for orthopedic felt or foam aperture pads or callus pads to put around the painful area.  Avoid going barefoot on the area

## 2023-08-24 NOTE — Progress Notes (Signed)
  Subjective:  Patient ID: Candace Tapia, female    DOB: Jan 13, 1936,  MRN: [id number]  Chief Complaint  Patient presents with   Foot Pain    "He scraped it last time I was here but it didn't do any good."    88 y.o. female presents with the above complaint. History confirmed with patient.  She returns for follow-up the pain has returned and still bothersome  Objective:  Physical Exam: warm, good capillary refill, no trophic changes or ulcerative lesions, normal DP and PT pulses, normal sensory exam, and left submetatarsal 2 callus area preulcerative callus Assessment:   1. Callus of foot   2. Metatarsalgia of right foot      Plan:  Patient was evaluated and treated and all questions answered.  Still no foreign body identified.  I debrided the overlying hyperkeratosis again and there was less of a puncture wound noted.  A discussed with her the importance of offloading utilizing a pressure foam or felt pads as well as shoe gear and avoiding going barefoot which she does not do at home very well.  Her son is here with her today and would like to trial this before proceeding with an ultrasound.  They will let me know if it does not improve.  Return if symptoms worsen or fail to improve.

## 2023-08-31 ENCOUNTER — Emergency Department

## 2023-08-31 ENCOUNTER — Observation Stay
Admission: EM | Admit: 2023-08-31 | Discharge: 2023-09-01 | Disposition: A | Discharge status: Home / Self Care | Attending: Internal Medicine | Admitting: Internal Medicine

## 2023-08-31 ENCOUNTER — Other Ambulatory Visit: Payer: Self-pay

## 2023-08-31 DIAGNOSIS — J441 Chronic obstructive pulmonary disease with (acute) exacerbation: Secondary | ICD-10-CM | POA: Diagnosis not present

## 2023-08-31 DIAGNOSIS — I110 Hypertensive heart disease with heart failure: Secondary | ICD-10-CM | POA: Diagnosis not present

## 2023-08-31 DIAGNOSIS — J189 Pneumonia, unspecified organism: Principal | ICD-10-CM | POA: Diagnosis present

## 2023-08-31 DIAGNOSIS — Z7951 Long term (current) use of inhaled steroids: Secondary | ICD-10-CM | POA: Diagnosis not present

## 2023-08-31 DIAGNOSIS — F419 Anxiety disorder, unspecified: Secondary | ICD-10-CM | POA: Insufficient documentation

## 2023-08-31 DIAGNOSIS — Z9889 Other specified postprocedural states: Secondary | ICD-10-CM | POA: Diagnosis not present

## 2023-08-31 DIAGNOSIS — K219 Gastro-esophageal reflux disease without esophagitis: Secondary | ICD-10-CM | POA: Insufficient documentation

## 2023-08-31 DIAGNOSIS — F32A Depression, unspecified: Secondary | ICD-10-CM | POA: Insufficient documentation

## 2023-08-31 DIAGNOSIS — Z87891 Personal history of nicotine dependence: Secondary | ICD-10-CM | POA: Diagnosis not present

## 2023-08-31 DIAGNOSIS — Z7982 Long term (current) use of aspirin: Secondary | ICD-10-CM | POA: Insufficient documentation

## 2023-08-31 DIAGNOSIS — R059 Cough, unspecified: Secondary | ICD-10-CM | POA: Diagnosis present

## 2023-08-31 DIAGNOSIS — J188 Other pneumonia, unspecified organism: Secondary | ICD-10-CM | POA: Insufficient documentation

## 2023-08-31 DIAGNOSIS — Z79899 Other long term (current) drug therapy: Secondary | ICD-10-CM | POA: Insufficient documentation

## 2023-08-31 DIAGNOSIS — R064 Hyperventilation: Secondary | ICD-10-CM | POA: Diagnosis not present

## 2023-08-31 DIAGNOSIS — I5032 Chronic diastolic (congestive) heart failure: Secondary | ICD-10-CM | POA: Insufficient documentation

## 2023-08-31 DIAGNOSIS — R0689 Other abnormalities of breathing: Secondary | ICD-10-CM | POA: Diagnosis not present

## 2023-08-31 DIAGNOSIS — Z853 Personal history of malignant neoplasm of breast: Secondary | ICD-10-CM | POA: Insufficient documentation

## 2023-08-31 DIAGNOSIS — R918 Other nonspecific abnormal finding of lung field: Secondary | ICD-10-CM | POA: Diagnosis not present

## 2023-08-31 DIAGNOSIS — R0982 Postnasal drip: Secondary | ICD-10-CM | POA: Insufficient documentation

## 2023-08-31 DIAGNOSIS — R0602 Shortness of breath: Secondary | ICD-10-CM | POA: Diagnosis not present

## 2023-08-31 LAB — RESP PANEL BY RT-PCR (RSV, FLU A&B, COVID)  RVPGX2
Influenza A by PCR: NEGATIVE
Influenza B by PCR: NEGATIVE
Resp Syncytial Virus by PCR: NEGATIVE
SARS Coronavirus 2 by RT PCR: NEGATIVE

## 2023-08-31 LAB — CBC WITH DIFFERENTIAL/PLATELET
Abs Immature Granulocytes: 0.04 10*3/uL (ref 0.00–0.07)
Basophils Absolute: 0.1 10*3/uL (ref 0.0–0.1)
Basophils Relative: 1 %
Eosinophils Absolute: 0.2 10*3/uL (ref 0.0–0.5)
Eosinophils Relative: 1 %
HCT: 41.8 % (ref 36.0–46.0)
Hemoglobin: 13.8 g/dL (ref 12.0–15.0)
Immature Granulocytes: 0 %
Lymphocytes Relative: 13 %
Lymphs Abs: 1.4 10*3/uL (ref 0.7–4.0)
MCH: 30.2 pg (ref 26.0–34.0)
MCHC: 33 g/dL (ref 30.0–36.0)
MCV: 91.5 fL (ref 80.0–100.0)
Monocytes Absolute: 0.6 10*3/uL (ref 0.1–1.0)
Monocytes Relative: 6 %
Neutro Abs: 8.3 10*3/uL — ABNORMAL HIGH (ref 1.7–7.7)
Neutrophils Relative %: 79 %
Platelets: 257 10*3/uL (ref 150–400)
RBC: 4.57 MIL/uL (ref 3.87–5.11)
RDW: 14.1 % (ref 11.5–15.5)
WBC: 10.6 10*3/uL — ABNORMAL HIGH (ref 4.0–10.5)
nRBC: 0 % (ref 0.0–0.2)

## 2023-08-31 LAB — COMPREHENSIVE METABOLIC PANEL WITH GFR
ALT: 10 U/L (ref 0–44)
AST: 24 U/L (ref 15–41)
Albumin: 3.5 g/dL (ref 3.5–5.0)
Alkaline Phosphatase: 60 U/L (ref 38–126)
Anion gap: 14 (ref 5–15)
BUN: 19 mg/dL (ref 8–23)
CO2: 23 mmol/L (ref 22–32)
Calcium: 9.2 mg/dL (ref 8.9–10.3)
Chloride: 102 mmol/L (ref 98–111)
Creatinine, Ser: 0.92 mg/dL (ref 0.44–1.00)
GFR, Estimated: >60 mL/min (ref >60)
Glucose, Bld: 136 mg/dL — ABNORMAL HIGH (ref 70–99)
Potassium: 3.5 mmol/L (ref 3.5–5.1)
Sodium: 139 mmol/L (ref 135–145)
Total Bilirubin: 0.7 mg/dL (ref 0.0–1.2)
Total Protein: 7 g/dL (ref 6.5–8.1)

## 2023-08-31 LAB — TROPONIN I (HIGH SENSITIVITY)
Troponin I (High Sensitivity): 6 ng/L (ref <18)
Troponin I (High Sensitivity): 6 ng/L (ref <18)

## 2023-08-31 LAB — BRAIN NATRIURETIC PEPTIDE: B Natriuretic Peptide: 488.7 pg/mL — ABNORMAL HIGH (ref 0.0–100.0)

## 2023-08-31 MED ORDER — ENOXAPARIN SODIUM 40 MG/0.4ML IJ SOSY: 40.0000 mg | PREFILLED_SYRINGE | INTRAMUSCULAR | Status: DC

## 2023-08-31 MED ORDER — GUAIFENESIN ER 600 MG PO TB12
1200.0000 mg | ORAL_TABLET | Freq: Two times a day (BID) | ORAL | Status: DC
  Administered 2023-08-31 – 2023-09-01 (×3): 1200 mg via ORAL
  Filled 2023-08-31 (×3): qty 2

## 2023-08-31 MED ORDER — BENZONATATE 100 MG PO CAPS: 100.0000 mg | ORAL_CAPSULE | Freq: Three times a day (TID) | ORAL | Status: DC | PRN

## 2023-08-31 MED ORDER — HYDRALAZINE HCL 20 MG/ML IJ SOLN: 5.0000 mg | Freq: Four times a day (QID) | INTRAMUSCULAR | Status: DC | PRN

## 2023-08-31 MED ORDER — SODIUM CHLORIDE 0.9 % IV SOLN
500.0000 mg | Freq: Once | INTRAVENOUS | Status: AC
  Administered 2023-08-31: 500 mg via INTRAVENOUS
  Filled 2023-08-31: qty 5

## 2023-08-31 MED ORDER — ESCITALOPRAM OXALATE 10 MG PO TABS
10.0000 mg | ORAL_TABLET | Freq: Every day | ORAL | Status: DC
  Administered 2023-09-01: 10 mg via ORAL
  Filled 2023-08-31: qty 1

## 2023-08-31 MED ORDER — IPRATROPIUM-ALBUTEROL 0.5-2.5 (3) MG/3ML IN SOLN
3.0000 mL | Freq: Four times a day (QID) | RESPIRATORY_TRACT | Status: DC
  Administered 2023-08-31: 3 mL via RESPIRATORY_TRACT
  Filled 2023-08-31: qty 3

## 2023-08-31 MED ORDER — PREDNISONE 10 MG PO TABS
50.0000 mg | ORAL_TABLET | Freq: Every day | ORAL | Status: DC
  Administered 2023-08-31 – 2023-09-01 (×2): 50 mg via ORAL
  Filled 2023-08-31 (×3): qty 1

## 2023-08-31 MED ORDER — IPRATROPIUM-ALBUTEROL 0.5-2.5 (3) MG/3ML IN SOLN
3.0000 mL | Freq: Three times a day (TID) | RESPIRATORY_TRACT | Status: DC
  Administered 2023-08-31 – 2023-09-01 (×2): 3 mL via RESPIRATORY_TRACT
  Filled 2023-08-31 (×2): qty 3

## 2023-08-31 MED ORDER — ONDANSETRON HCL 4 MG/2ML IJ SOLN: 4.0000 mg | Freq: Four times a day (QID) | INTRAMUSCULAR | Status: DC | PRN

## 2023-08-31 MED ORDER — FLUTICASONE FUROATE-VILANTEROL 200-25 MCG/ACT IN AEPB
1.0000 | INHALATION_SPRAY | Freq: Every day | RESPIRATORY_TRACT | Status: DC
  Administered 2023-08-31 – 2023-09-01 (×2): 1 via RESPIRATORY_TRACT
  Filled 2023-08-31: qty 28

## 2023-08-31 MED ORDER — ONDANSETRON HCL 4 MG PO TABS: 4.0000 mg | ORAL_TABLET | Freq: Four times a day (QID) | ORAL | Status: DC | PRN

## 2023-08-31 MED ORDER — HYDROCODONE-ACETAMINOPHEN 5-325 MG PO TABS: 1.0000 | ORAL_TABLET | Freq: Two times a day (BID) | ORAL | Status: DC | PRN

## 2023-08-31 MED ORDER — SODIUM CHLORIDE 0.9 % IV SOLN
2.0000 g | INTRAVENOUS | Status: DC
  Administered 2023-09-01: 2 g via INTRAVENOUS
  Filled 2023-08-31: qty 20

## 2023-08-31 MED ORDER — ENOXAPARIN SODIUM 60 MG/0.6ML IJ SOSY
0.5000 mg/kg | PREFILLED_SYRINGE | INTRAMUSCULAR | Status: DC
Start: 2023-08-31 — End: 2023-09-01
  Administered 2023-08-31: 47.5 mg via SUBCUTANEOUS
  Filled 2023-08-31: qty 0.6

## 2023-08-31 MED ORDER — ALBUTEROL SULFATE (2.5 MG/3ML) 0.083% IN NEBU: 2.5000 mg | INHALATION_SOLUTION | RESPIRATORY_TRACT | Status: DC | PRN

## 2023-08-31 MED ORDER — GABAPENTIN 300 MG PO CAPS
400.0000 mg | ORAL_CAPSULE | Freq: Two times a day (BID) | ORAL | Status: DC
  Administered 2023-08-31 – 2023-09-01 (×2): 400 mg via ORAL
  Filled 2023-08-31 (×2): qty 1

## 2023-08-31 MED ORDER — CHLORTHALIDONE 25 MG PO TABS: 25.0000 mg | ORAL_TABLET | Freq: Every day | ORAL | Status: DC

## 2023-08-31 MED ORDER — ACETAMINOPHEN 325 MG PO TABS
650.0000 mg | ORAL_TABLET | Freq: Three times a day (TID) | ORAL | Status: DC | PRN
Start: 2023-08-31 — End: 2023-09-01

## 2023-08-31 MED ORDER — FLUTICASONE PROPIONATE 50 MCG/ACT NA SUSP: 1.0000 | NASAL | Status: DC | PRN

## 2023-08-31 MED ORDER — LOSARTAN POTASSIUM 50 MG PO TABS: 100.0000 mg | ORAL_TABLET | Freq: Every day | ORAL | Status: DC

## 2023-08-31 MED ORDER — SODIUM CHLORIDE 0.9 % IV SOLN
2.0000 g | Freq: Once | INTRAVENOUS | Status: AC
  Administered 2023-08-31: 2 g via INTRAVENOUS
  Filled 2023-08-31: qty 20

## 2023-08-31 MED ORDER — IPRATROPIUM-ALBUTEROL 0.5-2.5 (3) MG/3ML IN SOLN
3.0000 mL | Freq: Once | RESPIRATORY_TRACT | Status: AC
Start: 2023-08-31 — End: 2023-08-31
  Administered 2023-08-31: 3 mL via RESPIRATORY_TRACT
  Filled 2023-08-31: qty 3

## 2023-08-31 MED ORDER — AZITHROMYCIN 500 MG PO TABS
500.0000 mg | ORAL_TABLET | Freq: Every day | ORAL | Status: DC
  Administered 2023-09-01: 500 mg via ORAL
  Filled 2023-08-31: qty 1

## 2023-08-31 MED ORDER — AMLODIPINE BESYLATE 5 MG PO TABS: 5.0000 mg | ORAL_TABLET | Freq: Every day | ORAL | Status: DC

## 2023-08-31 NOTE — Progress Notes (Signed)
 Unsuccessful PIV attempt.  No IV meds due until 5/16 at 1000. RN notified.

## 2023-08-31 NOTE — ED Notes (Signed)
 This RN attempted IV stick x2.

## 2023-08-31 NOTE — Evaluation (Signed)
 Clinical/Bedside Swallow Evaluation Patient Details  Name: Candace Tapia MRN: [id number] Date of Birth: 13-Dec-1935  Today's Date: 08/31/2023 Time: SLP Start Time (ACUTE ONLY): 1410 SLP Stop Time (ACUTE ONLY): 1500 SLP Time Calculation (min) (ACUTE ONLY): 50 min  Past Medical History:  Past Medical History:  Diagnosis Date   Allergy    Arthritis    Breast cancer (HCC) 2014   radiation and taking exemestane    Cancer of left breast (HCC) 10/27/2014   GERD (gastroesophageal reflux disease)    H/O: hysterectomy    History of lumpectomy 2014   LT breast   Spinal stenosis of lumbar region 03/08/2023   Past Surgical History:  Past Surgical History:  Procedure Laterality Date   ABDOMINAL HYSTERECTOMY     BREAST LUMPECTOMY Left 2014   BREAST SURGERY Left 2014   JOINT REPLACEMENT     LYMPH NODE DISSECTION     neck   TOTAL SHOULDER REPLACEMENT Bilateral 2010 and 2013   HPI:  Pt is a 88 y.o. female with medical history significant of COPD Gold stage II, Obesity, breast cancer status post lumpectomy/radiation/chemotherapy, GERD, HTN, UTI, presented with worsening of cough and shortness of breath.  Son stated pt wears [place] O2 (2.5L) at night.  Son stated pt is Sedentary in the house.  Patient started to have a productive cough few days ago with thick yellowish sputum production and increasing shortness of breath, and woke up this morning with severe cough and shortness of breath.  Denied any chest pain no fever or chills.  Denied any swallowing issue, no recent episodes of nauseous vomiting.  She lives by herself w/ Caregivers during the day intermittently to assist w/ ADLs, per the Son.  She has fallen in the home several times per report.  She uses a walker per Son/pt.  Per MD note, pt's COPD appears to be poorly controlled with "always wheezing" reported by pt.    CXR: Ill-defined reticulonodular pulmonary infiltrates in the right mid upper lobe distribution and left lower lobe.    Assessment  / Plan / Recommendation  Clinical Impression   Pt seen for BSE today. Pt awake, verbal and talked frequently. She was easily (min) distracted but easily redirected. Pt is HOH. She wanted to see Speaker's mouth for added input to aid [facility]. Son present and stated pt has Caregivers in the home during the day intermitently to "help" pt w/ ADLs but that "she doesn't always wait for them to get to the house to help w/ things like taking a bath". Pt uses a walker at home.  On RA, afebrile. WBC 10.6.  Pt appears to present w/ functional oropharyngeal phase swallowing w/ No oropharyngeal phase dysphagia noted, No neuromuscular deficits noted. Pt consumed po trials w/ No overt, clinical s/s of aspiration during the po trials.  Pt appears at reduced risk for aspiration following general aspiration precautions. However, pt does have challenging factors that could impact oropharyngeal swallowing to include deconditioning/weakness, COPD, and GERD. These factors can increase risk for dysphagia as well as decreased oral intake overall.   During po trials, pt consumed all consistencies placed on tray table w/ no overt coughing, decline in vocal quality, or change in respiratory presentation during/post trials. No decline in O2 sats when checked, 98%. Oral phase appeared [facility] w/ timely bolus management, mastication, and control of bolus propulsion for A-P transfer for swallowing. Oral clearing achieved w/ all trial consistencies -- moistened, soft foods given.  OM Exam appeared [facility] w/ no unilateral  weakness noted. Speech Clear, intelligible. Pt fed self w/ setup support.   Recommend continue a Regular consistency diet w/ well-Cut meats, moistened foods; Thin liquids -- monitor straw use, and pt should Hold Cup when drinking. Recommend general aspiration precautions, reduce distractions and Talking during oral intake/meals. Small bites/sips Slowly to avoid any increased WOB/SOB during oral intake/meals. Sitting up in a chair  best for eating/drinking. REFLUX precautions. Pills WHOLE in Puree for safer, easier swallowing -- pt has done this at home per Son, and it was encouraged now and for D/C to the pt/Son.   Education given on Pills in Puree; food consistencies and easy to eat options; general aspiration and REFLUX precautions to pt and Son. Handout given. No further skilled ST services indicated. MD/NSG to reconsult if any new needs arise. NSG updated, agreed. MD updated.  Recommend Dietician f/u for support. Precautions posted in chart, room.  SLP Visit Diagnosis: Dysphagia, unspecified (R13.10) (GERD and COPD baseline)    Aspiration Risk   (reduced when following general aspiration precautions)    Diet Recommendation   Thin;Age appropriate regular (cut meats, moistened foods) = continue a Regular consistency diet w/ well-Cut meats, moistened foods; Thin liquids -- monitor straw use, and pt should Hold Cup when drinking. Recommend general aspiration precautions, reduce distractions and Talking during oral intake/meals. Small bites/sips Slowly to avoid any increased WOB/SOB during oral intake/meals. Sitting up in a chair best for eating/drinking. REFLUX precautions.  Medication Administration: Whole meds with puree (she does this Baseline w/ Large pills per Dollie Fresh[place])    Other  Recommendations Recommended Consults: Consider GI evaluation;Consider esophageal assessment (for management of GERD(not on a PPI); Dietician) Oral Care Recommendations: Oral care BID;Patient independent with oral care (setup by STaff)    Recommendations for follow up therapy are one component of a multi-disciplinary discharge planning process, led by the attending physician.  Recommendations may be updated based on patient status, additional functional criteria and insurance authorization.  Follow up Recommendations No SLP follow up      Assistance Recommended at Discharge  Intermittent for setup  Functional Status Assessment Patient has not  had a recent decline in their functional status  Frequency and Duration  (n/a)   (n/a)       Prognosis Prognosis for improved oropharyngeal function: Good Barriers to Reach Goals: Time post onset;Severity of deficits Barriers/Prognosis Comment: GERD; COPD baseline      Swallow Study   General Date of Onset: 08/31/23 HPI: Pt is a 88 y.o. female with medical history significant of COPD Gold stage II, Obesity, breast cancer status post lumpectomy/radiation/chemotherapy, GERD, HTN, presented with worsening of cough and shortness of breath.  Son stated pt wears [place] O2 (2.5L) at night.  Son stated pt is Sedentary in the house.  Patient started to have a productive cough few days ago with thick yellowish sputum production and increasing shortness of breath, and woke up this morning with severe cough and shortness of breath.  Denied any chest pain no fever or chills.  Denied any swallowing issue, no recent episodes of nauseous vomiting.  She lives by herself w/ Caregivers during the day intermittently to assist w/ ADLs, per the Son.  She has fallen in the home several times per report.  She uses a walker per Son/pt.  Per MD note, pt's COPD appears to be poorly controlled with "always wheezing" reported by pt.    CXR: Ill-defined reticulonodular pulmonary infiltrates in the right mid upper lobe distribution and left lower lobe. Type  of Study: Bedside Swallow Evaluation Previous Swallow Assessment: none Diet Prior to this Study: Regular;Thin liquids (Level 0) Temperature Spikes Noted: No (wbc 10.6) Respiratory Status: Room air History of Recent Intubation: No Behavior/Cognition: Alert;Cooperative;Pleasant mood;Distractible;Requires cueing (Talkative; HOH) Oral Cavity Assessment: Within Functional Limits Oral Care Completed by SLP: Recent completion by staff Oral Cavity - Dentition: Adequate natural dentition Vision: Functional for self-feeding Self-Feeding Abilities: Able to feed self;Needs set  up Patient Positioning: Upright in bed (needed MOD support for upright sitting) Baseline Vocal Quality: Normal Volitional Cough: Strong Volitional Swallow: Able to elicit    Oral/Motor/Sensory Function Overall Oral Motor/Sensory Function: Within functional limits   Ice Chips Ice chips: Not tested   Thin Liquid Thin Liquid: Within functional limits Presentation: Cup;Self Fed;Straw (~4 ozs total) Other Comments: water, juice    Nectar Thick Nectar Thick Liquid: Not tested   Honey Thick Honey Thick Liquid: Not tested   Puree Puree: Within functional limits Presentation: Self Fed;Spoon (~3 ozs)   Solid     Solid: Within functional limits Presentation: Self Fed (15+ trials)        Darla Edward, MS, CCC-SLP Speech Language Pathologist Rehab Services; [facility] [place] [phone] (ascom) Nuno Brubacher 08/31/2023,4:50 PM

## 2023-08-31 NOTE — Progress Notes (Signed)
 PHARMACIST - PHYSICIAN COMMUNICATION  CONCERNING:  Enoxaparin  (Lovenox ) for DVT Prophylaxis    RECOMMENDATION: Patient was prescribed enoxaprin 40mg  q24 hours for VTE prophylaxis.   Filed Weights   08/31/23 0941  Weight: 97.2 kg (214 lb 4.6 oz)    Body mass index is 35.66 kg/m.  Estimated Creatinine Clearance: 50.7 mL/min (by C-G formula based on SCr of 0.92 mg/dL).   Based on [facility] policy patient is candidate for enoxaparin  0.5mg /kg TBW SQ every 24 hours based on BMI being >30.   DESCRIPTION: Pharmacy has adjusted enoxaparin  dose per [facility] policy.  Patient is now receiving enoxaparin  47.5 mg every 24 hours   Malone Sear, PharmD, BCPS Clinical Pharmacist 08/31/2023 12:34 PM

## 2023-08-31 NOTE — H&P (Signed)
 History and Physical    Candace Tapia:[id number] DOB: December 31, 1935 DOA: 08/31/2023  PCP: Raina Bunting, DO (Confirm with patient/family/NH records and if not entered, this has to be entered at [facility] point of entry) Patient coming from: Home  I have personally briefly reviewed patient's old medical records in [facility]  Chief Complaint: Cough, SOB  HPI: Candace Tapia is a 88 y.o. female with medical history significant of COPD Gold stage II, breast cancer status post lumpectomy/radiation/chemotherapy, GERD, HTN, presented with worsening of cough and shortness of breath.  Patient started to have a productive cough few days ago with thick yellowish sputum production and increasing shortness of breath, and woke up this morning with severe cough and shortness of breath.  Denied any chest pain no fever or chills.  Denied any swallowing issue, no recent episodes of nauseous vomiting.  She lives by herself and reported that he quit smoking more than 30 years ago.  COPD appears to be poorly controlled with " always wheezing"  ED Course: Afebrile, nontachycardic positive for tachypneic blood pressure 114/53, O2 saturation 95% on room air.  Chest x-ray showed multifocal infiltrates including left lower lobe and right middle lobe.  Blood work showed WBC 10.6 hemoglobin 13.8 BUN 19 creatinine 0.9 glucose 136.  Patient was given ceftriaxone  and azithromycin in the ED.  Review of Systems: As per HPI otherwise 14 point review of systems negative.    Past Medical History:  Diagnosis Date   Allergy    Arthritis    Breast cancer (HCC) 2014   radiation and taking exemestane    Cancer of left breast (HCC) 10/27/2014   GERD (gastroesophageal reflux disease)    H/O: hysterectomy    History of lumpectomy 2014   LT breast   Spinal stenosis of lumbar region 03/08/2023    Past Surgical History:  Procedure Laterality Date   ABDOMINAL HYSTERECTOMY     BREAST LUMPECTOMY Left 2014    BREAST SURGERY Left 2014   JOINT REPLACEMENT     LYMPH NODE DISSECTION     neck   TOTAL SHOULDER REPLACEMENT Bilateral 2010 and 2013     reports that she quit smoking about 30 years ago. Her smoking use included cigarettes. She started smoking about 70 years ago. She has a 80 pack-year smoking history. She has never used smokeless tobacco. She reports that she does not drink alcohol and does not use drugs.  Allergies  Allergen Reactions   Tramadol  Hcl Itching    Per pt it caused me to itch all over   Statins     Not sure what exact med was; just made her feel bad (can't elaborate)   Penicillin G Rash   Penicillins Rash    Has patient had a PCN reaction causing immediate rash, facial/tongue/throat swelling, SOB or lightheadedness with hypotension: No Has patient had a PCN reaction causing severe rash involving mucus membranes or skin necrosis: No Has patient had a PCN reaction that required hospitalization: No Has patient had a PCN reaction occurring within the last 10 years: No If all of the above answers are "NO", then may proceed with Cephalosporin use.   Sulfa Antibiotics Other (See Comments) and Rash    Other Reaction(s): Not available    Family History  Problem Relation Age of Onset   Breast cancer Mother 42   Diabetes Mother    Kidney disease Father    Diabetes Father    Breast cancer Sister 15  Prior to Admission medications   Medication Sig Start Date End Date Taking? Authorizing Provider  acetaminophen  (TYLENOL ) 650 MG CR tablet Take 650 mg by mouth every 8 (eight) hours as needed for pain.    [provider]  albuterol  (VENTOLIN  HFA) 108 (90 Base) MCG/ACT inhaler Inhale 1 puff into the lungs every 6 (six) hours as needed.     [provider]  amLODipine  (NORVASC ) 5 MG tablet TAKE 1 TABLET(5 MG) BY MOUTH DAILY 01/10/23   Romeo Co, Kayleen Party, DO  aspirin  81 MG chewable tablet Chew 81 mg by mouth daily as needed.    [provider]   chlorthalidone  (HYGROTON ) 25 MG tablet Take 1 tablet (25 mg total) by mouth daily. 03/07/23   Karamalegos, Kayleen Party, DO  Cholecalciferol 25 MCG (1000 UT) capsule Vitamin D3 25 mcg (1,000 unit) capsule  1 po qd    [provider]  dicyclomine  (BENTYL ) 10 MG capsule Take 1 capsule (10 mg total) by mouth 3 (three) times daily before meals. Take as needed for abdominal bloating / cramping symptoms Patient not taking: Reported on 06/27/2023 02/24/23   Raina Bunting, DO  escitalopram  (LEXAPRO ) 10 MG tablet TAKE 1 TABLET(10 MG) BY MOUTH DAILY 07/11/23   Romeo Co, Kayleen Party, DO  fluticasone  (FLONASE ) 50 MCG/ACT nasal spray Place 1 spray into both nostrils as needed.     [provider]  gabapentin  (NEURONTIN ) 400 MG capsule TAKE 1 CAPSULE(400 MG) BY MOUTH TWICE DAILY 08/09/23   Romeo Co, Kayleen Party, DO  HYDROcodone -acetaminophen  (NORCO/VICODIN) 5-325 MG tablet Take 1 tablet by mouth 2 (two) times daily as needed.    [provider]  levofloxacin  (LEVAQUIN ) 500 MG tablet Take 1 tablet (500 mg total) by mouth daily. For 7 days Patient not taking: Reported on 08/23/2023 07/03/23   Raina Bunting, DO  losartan  (COZAAR ) 100 MG tablet Take 1 tablet (100 mg total) by mouth daily. 03/07/23   Raina Bunting, DO  magic mouthwash w/lidocaine  SOLN Swish, gargle, and spit one to two teaspoonfuls every six hours as needed. Shake well before using. Patient not taking: Reported on 08/23/2023 04/17/23   Raina Bunting, DO  meclizine  (ANTIVERT ) 25 MG tablet Take 0.5 tablets (12.5 mg total) by mouth every 8 (eight) hours as needed for dizziness. Patient not taking: Reported on 06/27/2023 02/23/22   Raina Bunting, DO  meloxicam  (MOBIC ) 7.5 MG tablet Take 7.5 mg by mouth 3 (three) times daily.    [provider]  Multiple Vitamins-Minerals (MULTIVITAMIN WOMEN 50+) TABS Take 1 tablet by mouth daily.     [provider]  NEXIUM  20  MG capsule Take 1 capsule (20 mg total) by mouth daily. Patient not taking: Reported on 06/27/2023 07/09/15   Vergil Glasser., MD  ondansetron  (ZOFRAN -ODT) 4 MG disintegrating tablet Take 1 tablet (4 mg total) by mouth every 8 (eight) hours as needed for nausea or vomiting. Patient not taking: Reported on 06/27/2023 02/24/23   Raina Bunting, DO  oseltamivir  (TAMIFLU ) 75 MG capsule Take 1 capsule (75 mg total) by mouth 2 (two) times daily. For 5 days Patient not taking: Reported on 08/23/2023 06/30/23   Raina Bunting, DO  potassium chloride  (MICRO-K ) 10 MEQ CR capsule TAKE 1 CAPSULE(10 MEQ) BY MOUTH DAILY. MAY OPEN CAPSULE SPRINKLE ON SOFT FOOD OR. SWALLOW WHOLE 02/06/23   Karamalegos, Kayleen Party, DO  predniSONE  (DELTASONE ) 50 MG tablet Take 1 tablet (50 mg total) by mouth daily with breakfast. 06/30/23  Karamalegos, Kayleen Party, DO  Probiotic Product (PROBIOTIC DAILY PO) Take by mouth.    [provider]  Spacer/Aero-Holding Chambers (AEROCHAMBER PLUS) inhaler Use as instructed 08/18/19   Ethlyn Herd, MD  fexofenadine (ALLEGRA ALLERGY) 180 MG tablet Take 180 mg by mouth as needed. Reported on 09/29/2015 01/29/14 08/18/19  [provider]    Physical Exam: Vitals:   08/31/23 0938 08/31/23 0941 08/31/23 1130  BP: 104/76  (!) 114/53  Pulse: 96  85  Resp: (!) 22  (!) 21  Temp: 97.8 F (36.6 C)    TempSrc: Oral    SpO2: 100%  95%  Weight:  97.2 kg   Height:  5\' 5"  (1.651 m)     Constitutional: NAD, calm, comfortable Vitals:   08/31/23 0938 08/31/23 0941 08/31/23 1130  BP: 104/76  (!) 114/53  Pulse: 96  85  Resp: (!) 22  (!) 21  Temp: 97.8 F (36.6 C)    TempSrc: Oral    SpO2: 100%  95%  Weight:  97.2 kg   Height:  5\' 5"  (1.651 m)    Eyes: PERRL, lids and conjunctivae normal ENMT: Mucous membranes are moist. Posterior pharynx clear of any exudate or lesions.Normal dentition.  Neck: normal, supple, no masses, no thyromegaly Respiratory:  clear to auscultation bilaterally, diffused wheezing, crackles of left lower field and right mid field, increasing respiratory effort. No accessory muscle use.  Cardiovascular: Regular rate and rhythm, no murmurs / rubs / gallops. No extremity edema. 2+ pedal pulses. No carotid bruits.  Abdomen: no tenderness, no masses palpated. No hepatosplenomegaly. Bowel sounds positive.  Musculoskeletal: no clubbing / cyanosis. No joint deformity upper and lower extremities. Good ROM, no contractures. Normal muscle tone.  Skin: no rashes, lesions, ulcers. No induration Neurologic: CN 2-12 grossly intact. Sensation intact, DTR normal. Strength 5/5 in all 4.  Psychiatric: Normal judgment and insight. Alert and oriented x 3. Normal mood.     Labs on Admission: I have personally reviewed following labs and imaging studies  CBC: Recent Labs  Lab 08/31/23 0951  WBC 10.6*  NEUTROABS 8.3*  HGB 13.8  HCT 41.8  MCV 91.5  PLT 257   Basic Metabolic Panel: Recent Labs  Lab 08/31/23 0951  NA 139  K 3.5  CL 102  CO2 23  GLUCOSE 136*  BUN 19  CREATININE 0.92  CALCIUM 9.2   GFR: Estimated Creatinine Clearance: 50.7 mL/min (by C-G formula based on SCr of 0.92 mg/dL). Liver Function Tests: Recent Labs  Lab 08/31/23 0951  AST 24  ALT 10  ALKPHOS 60  BILITOT 0.7  PROT 7.0  ALBUMIN 3.5   No results for input(s): "LIPASE", "AMYLASE" in the last 168 hours. No results for input(s): "AMMONIA" in the last 168 hours. Coagulation Profile: No results for input(s): "INR", "PROTIME" in the last 168 hours. Cardiac Enzymes: No results for input(s): "CKTOTAL", "CKMB", "CKMBINDEX", "TROPONINI" in the last 168 hours. BNP (last 3 results) No results for input(s): "PROBNP" in the last 8760 hours. HbA1C: No results for input(s): "HGBA1C" in the last 72 hours. CBG: No results for input(s): "GLUCAP" in the last 168 hours. Lipid Profile: No results for input(s): "CHOL", "HDL", "LDLCALC", "TRIG", "CHOLHDL",  "LDLDIRECT" in the last 72 hours. Thyroid  Function Tests: No results for input(s): "TSH", "T4TOTAL", "FREET4", "T3FREE", "THYROIDAB" in the last 72 hours. Anemia Panel: No results for input(s): "VITAMINB12", "FOLATE", "FERRITIN", "TIBC", "IRON", "RETICCTPCT" in the last 72 hours. Urine analysis:    Component Value Date/Time   COLORURINE  YELLOW (A) 06/24/2023 0312   APPEARANCEUR HAZY (A) 06/24/2023 0312   LABSPEC >1.046 (H) 06/24/2023 0312   PHURINE 5.0 06/24/2023 0312   GLUCOSEU NEGATIVE 06/24/2023 0312   HGBUR SMALL (A) 06/24/2023 0312   BILIRUBINUR small 07/21/2023 1420   KETONESUR NEGATIVE 06/24/2023 0312   PROTEINUR Positive (A) 07/21/2023 1420   PROTEINUR NEGATIVE 06/24/2023 0312   UROBILINOGEN 0.2 07/21/2023 1420   NITRITE negative 07/21/2023 1420   NITRITE POSITIVE (A) 06/24/2023 0312   LEUKOCYTESUR Small (1+) (A) 07/21/2023 1420   LEUKOCYTESUR MODERATE (A) 06/24/2023 0312    Radiological Exams on Admission: DG Chest Portable 1 View Result Date: 08/31/2023 CLINICAL DATA:  Shortness of breath EXAM: PORTABLE CHEST 1 VIEW COMPARISON:  June 17, 2022 FINDINGS: Ill-defined reticulonodular pulmonary infiltrates in the right mid upper lobe distribution and left lower lobe. Correlate clinically to rule out pneumonia pneumonitis. IMPRESSION: Ill-defined reticulonodular pulmonary infiltrates in the right mid upper lobe distribution and left lower lobe. Electronically Signed   By: Fredrich Jefferson M.D.   On: 08/31/2023 09:51    EKG: Independently reviewed.  Sinus rhythm, chronic LBBB  Assessment/Plan Principal Problem:   CAP (community acquired pneumonia) Active Problems:   COPD with acute exacerbation (HCC)   Multifocal pneumonia  (please populate well all problems here in Problem List. (For example, if patient is on BP meds at home and you resume or decide to hold them, it is a problem that needs to be her. Same for CAD, COPD, HLD and so on)  Multifocal pneumonia Multifocal CAP -  Appears to have no aspiration risk.  But given the nature of multifocal pneumonia, we will consult speech evaluation. - Continue ceftriaxone  and azithromycin - Sputum culture - Other supportive care incentive spirometry and flutter valve - Repeat chest x-ray in 4 weeks to document resolution  Acute COPD exacerbation - Probably triggered by CAP - Short course of p.o. prednisone  - DuoNebs and as needed albuterol  - Start patient on ICS and LABA  HTN -BP borderline low, will hold off amlodipine , losartan  and chlorthalidone  - Start as needed hydralazine  GERD - Fairly controlled, continue PPI  Anxiety/depression - Continue Lexapro   Deconditioning - PT evaluation  DVT prophylaxis: Lovenox  Code Status: DNR/DNI Family Communication: None at bedside Disposition Plan: Expect less than 2 midnight hospital stay Consults called: None Admission status: Telemetry observation   [place] MD [facility] Pager [phone]  08/31/2023, 12:31 PM

## 2023-08-31 NOTE — ED Notes (Signed)
 Family at bedside. Pt given warm blanket upon request

## 2023-08-31 NOTE — ED Notes (Signed)
 Called CCMD for central monitoring

## 2023-08-31 NOTE — Plan of Care (Signed)
  Problem: Activity: Goal: Ability to tolerate increased activity will improve Outcome: Progressing   Problem: Clinical Measurements: Goal: Ability to maintain a body temperature in the normal range will improve Outcome: Progressing   Problem: Respiratory: Goal: Ability to maintain adequate ventilation will improve Outcome: Progressing Goal: Ability to maintain a clear airway will improve Outcome: Progressing   Problem: Education: Goal: Knowledge of General Education information will improve Description: Including pain rating scale, medication(s)/side effects and non-pharmacologic comfort measures Outcome: Progressing   Problem: Health Behavior/Discharge Planning: Goal: Ability to manage health-related needs will improve Outcome: Progressing   

## 2023-08-31 NOTE — ED Notes (Signed)
 MD aware of pt's low-risk suicide assessment score.

## 2023-08-31 NOTE — ED Triage Notes (Signed)
 Pt presents to the ED via [facility] from home. EMS was called out for breathing difficulty. Upon arrival patient was hyperventilating with an O2 saturation of 97% on RA. Pt anxious and hyperventilating at time of triage. Hx of COPD  Pt reported to this RN that she wished she could go to sleep and not wake up. Pt states that she has not had thoughts or intentions of acting on this. Pt states that she just feels miserable all the time.

## 2023-08-31 NOTE — ED Provider Notes (Signed)
 [facility] Provider Note    Event Date/Time   First MD Initiated Contact with Patient 08/31/23 [phone]     (approximate)   History   Chief Complaint: Shortness of Breath   HPI  Candace Tapia is a 88 y.o. female with a history of GERD, anxiety, COPD, breast cancer who comes ED complaining of shortness of breath this morning, reports she feels she is suffocating.  Denies pain.  No lower extremity swelling.        Past Medical History:  Diagnosis Date   Allergy    Arthritis    Breast cancer (HCC) 2014   radiation and taking exemestane    Cancer of left breast (HCC) 10/27/2014   GERD (gastroesophageal reflux disease)    H/O: hysterectomy    History of lumpectomy 2014   LT breast   Spinal stenosis of lumbar region 03/08/2023       Past Surgical History:  Procedure Laterality Date   ABDOMINAL HYSTERECTOMY     BREAST LUMPECTOMY Left 2014   BREAST SURGERY Left 2014   JOINT REPLACEMENT     LYMPH NODE DISSECTION     neck   TOTAL SHOULDER REPLACEMENT Bilateral 2010 and 2013    Physical Exam   Triage Vital Signs: ED Triage Vitals  Encounter Vitals Group     BP      Systolic BP Percentile      Diastolic BP Percentile      Pulse      Resp      Temp      Temp src      SpO2      Weight      Height      Head Circumference      Peak Flow      Pain Score      Pain Loc      Pain Education      Exclude from Growth Chart     Most recent vital signs: Vitals:   08/31/23 1449 08/31/23 1500  BP:  128/85  Pulse:  89  Resp:  20  Temp:  97.8 F (36.6 C)  SpO2: 95% 97%    General: Awake, mild respiratory distress CV:  Good peripheral perfusion.  Regular rate and rhythm Resp:  Normal effort.  Tachypnea.  Left-sided crackles. Abd:  No distention.  Soft nontender Other:  Or lower extremity edema   ED Results / Procedures / Treatments   Labs (all labs ordered are listed, but only abnormal results are displayed) Labs Reviewed   COMPREHENSIVE METABOLIC PANEL WITH GFR - Abnormal; Notable for the following components:      Result Value   Glucose, Bld 136 (*)    All other components within normal limits  BRAIN NATRIURETIC PEPTIDE - Abnormal; Notable for the following components:   B Natriuretic Peptide 488.7 (*)    All other components within normal limits  CBC WITH DIFFERENTIAL/PLATELET - Abnormal; Notable for the following components:   WBC 10.6 (*)    Neutro Abs 8.3 (*)    All other components within normal limits  RESP PANEL BY RT-PCR (RSV, FLU A&B, COVID)  [id number]  EXPECTORATED SPUTUM ASSESSMENT W GRAM STAIN, RFLX TO RESP C  TROPONIN I (HIGH SENSITIVITY)  TROPONIN I (HIGH SENSITIVITY)     EKG Interpreted by me Sinus rhythm, rate 88. Left axis, LBBB. Prwp. No acute ischemic changes.   RADIOLOGY Chest x-ray interpreted by me, shows left-sided pneumonia.  Radiology report reviewed noting right  upper lobe pneumonia as well   PROCEDURES:  Procedures   MEDICATIONS ORDERED IN ED: Medications  acetaminophen  (TYLENOL ) tablet 650 mg (has no administration in time range)  HYDROcodone -acetaminophen  (NORCO/VICODIN) 5-325 MG per tablet 1 tablet (has no administration in time range)  escitalopram  (LEXAPRO ) tablet 10 mg (has no administration in time range)  predniSONE  (DELTASONE ) tablet 50 mg (50 mg Oral Given 08/31/23 1457)  gabapentin  (NEURONTIN ) capsule 400 mg (has no administration in time range)  albuterol  (PROVENTIL ) (2.5 MG/3ML) 0.083% nebulizer solution 2.5 mg (has no administration in time range)  fluticasone  (FLONASE ) 50 MCG/ACT nasal spray 1 spray (has no administration in time range)  cefTRIAXone  (ROCEPHIN ) 2 g in sodium chloride  0.9 % 100 mL IVPB (has no administration in time range)  azithromycin (ZITHROMAX) tablet 500 mg (has no administration in time range)  guaiFENesin (MUCINEX) 12 hr tablet 1,200 mg (1,200 mg Oral Given 08/31/23 1457)  benzonatate  (TESSALON ) capsule 100 mg (has no  administration in time range)  ondansetron  (ZOFRAN ) tablet 4 mg (has no administration in time range)    Or  ondansetron  (ZOFRAN ) injection 4 mg (has no administration in time range)  enoxaparin  (LOVENOX ) injection 47.5 mg (has no administration in time range)  fluticasone  furoate-vilanterol (BREO ELLIPTA) 200-25 MCG/ACT 1 puff (1 puff Inhalation Given 08/31/23 1507)  hydrALAZINE (APRESOLINE) injection 5 mg (has no administration in time range)  ipratropium-albuterol  (DUONEB) 0.5-2.5 (3) MG/3ML nebulizer solution 3 mL (has no administration in time range)  ipratropium-albuterol  (DUONEB) 0.5-2.5 (3) MG/3ML nebulizer solution 3 mL (3 mLs Nebulization Given 08/31/23 0949)  cefTRIAXone  (ROCEPHIN ) 2 g in sodium chloride  0.9 % 100 mL IVPB (0 g Intravenous Stopped 08/31/23 1114)  azithromycin (ZITHROMAX) 500 mg in sodium chloride  0.9 % 250 mL IVPB (0 mg Intravenous Stopped 08/31/23 1229)     IMPRESSION / MDM / ASSESSMENT AND PLAN / ED COURSE  I reviewed the triage vital signs and the nursing notes.  DDx: Pneumonia, pleural effusion, pulmonary edema, COPD exacerbation, panic, pulmonary embolism, non-STEMI  Patient's presentation is most consistent with acute presentation with potential threat to life or bodily function.  Patient presents with shortness of breath, tachypnea.  With her past medical history, differential is broad, will obtain labs, chest x-ray, possible CT angiogram chest.   ----------------------------------------- 12:37 PM on 08/31/2023 ----------------------------------------- Chest x-ray reveals multifocal pneumonia.  CT not necessary right now.  Antibiotic started.  Not septic.  Case discussed with the hospitalist.      FINAL CLINICAL IMPRESSION(S) / ED DIAGNOSES   Final diagnoses:  Multifocal pneumonia  COPD exacerbation (HCC)     Rx / DC Orders   ED Discharge Orders     None        Note:  This document was prepared using Dragon voice recognition software and  may include unintentional dictation errors.   Jacquie Maudlin, MD 08/31/23 [phone]

## 2023-09-01 ENCOUNTER — Telehealth: Payer: Self-pay

## 2023-09-01 ENCOUNTER — Other Ambulatory Visit: Payer: Self-pay

## 2023-09-01 DIAGNOSIS — J189 Pneumonia, unspecified organism: Secondary | ICD-10-CM | POA: Diagnosis not present

## 2023-09-01 DIAGNOSIS — F4321 Adjustment disorder with depressed mood: Secondary | ICD-10-CM

## 2023-09-01 DIAGNOSIS — J441 Chronic obstructive pulmonary disease with (acute) exacerbation: Secondary | ICD-10-CM

## 2023-09-01 LAB — BASIC METABOLIC PANEL WITH GFR
Anion gap: 9 (ref 5–15)
BUN: 18 mg/dL (ref 8–23)
CO2: 27 mmol/L (ref 22–32)
Calcium: 9.3 mg/dL (ref 8.9–10.3)
Chloride: 104 mmol/L (ref 98–111)
Creatinine, Ser: 0.98 mg/dL (ref 0.44–1.00)
GFR, Estimated: 56 mL/min — ABNORMAL LOW (ref >60)
Glucose, Bld: 134 mg/dL — ABNORMAL HIGH (ref 70–99)
Potassium: 4.5 mmol/L (ref 3.5–5.1)
Sodium: 140 mmol/L (ref 135–145)

## 2023-09-01 LAB — CBC
HCT: 38.5 % (ref 36.0–46.0)
Hemoglobin: 12.7 g/dL (ref 12.0–15.0)
MCH: 29.6 pg (ref 26.0–34.0)
MCHC: 33 g/dL (ref 30.0–36.0)
MCV: 89.7 fL (ref 80.0–100.0)
Platelets: 308 10*3/uL (ref 150–400)
RBC: 4.29 MIL/uL (ref 3.87–5.11)
RDW: 14.3 % (ref 11.5–15.5)
WBC: 10.4 10*3/uL (ref 4.0–10.5)
nRBC: 0 % (ref 0.0–0.2)

## 2023-09-01 LAB — PROCALCITONIN: Procalcitonin: <0.1 ng/mL

## 2023-09-01 MED ORDER — PREDNISONE 20 MG PO TABS: 20.0000 mg | ORAL_TABLET | Freq: Every day | ORAL | 0 refills | Status: DC

## 2023-09-01 MED ORDER — IPRATROPIUM-ALBUTEROL 0.5-2.5 (3) MG/3ML IN SOLN: 3.0000 mL | Freq: Two times a day (BID) | RESPIRATORY_TRACT | Status: DC

## 2023-09-01 MED ORDER — FUROSEMIDE 20 MG PO TABS: 20.0000 mg | ORAL_TABLET | Freq: Every day | ORAL | 0 refills | Status: DC

## 2023-09-01 MED ORDER — FLUTICASONE PROPIONATE 50 MCG/ACT NA SUSP: 1.0000 | Freq: Every day | NASAL | 0 refills | Status: AC

## 2023-09-01 NOTE — Evaluation (Signed)
 Physical Therapy Evaluation Patient Details Name: ELLERI BRUINGTON MRN: [id number] DOB: March 15, 1936 Today's Date: 09/01/2023  History of Present Illness  presented to ER secondary to cough, SOB; admitted for management of multifocal CAP.   History of osteoarthritis, GERD, breast cancer status post lumpectomy, radiotherapy and chemotherapy  Clinical Impression  Patient resting in bed upon arrival to session; alert and oriented, follows commands and agreeable to participation with session.  Eager for anticipated discharge home this date.  Denies acute pain.  Bilat UE/LE strength and ROM grossly symmetrical and WFL; no focal weakness appreciated.  Able to complete bed mobility with indep; sit/stand, basic transfers and gait (60') with RW, cga/close sup.  Demonstrates Mild dizziness with initial transition to standing, improves with accommodation to position. Slightly impulsive with brisk cadence; cuing for slower, paced activity to manage SOB. MOD BORG 5-6/10 with gait efforts; sats 94-95% on RA, HR 115-120  Educated on activity pacing, energy conservation and household modifications to help manage SOB; reviewed importance of intermittent rest periods and awareness of signs/symptoms of fatigue.  Patient voiced understanding of all information. Would benefit from skilled PT to address above deficits and promote optimal return to PLOF.; recommend post-acute PT follow up as indicated by interdisciplinary care team.          If plan is discharge home, recommend the following: A little help with walking and/or transfers;A little help with bathing/dressing/bathroom   Can travel by private vehicle        Equipment Recommendations  (has 9GEX)  Recommendations for Other Services       Functional Status Assessment Patient has had a recent decline in their functional status and demonstrates the ability to make significant improvements in function in a reasonable and predictable amount of time.      Precautions / Restrictions Precautions Precautions: Fall Restrictions Weight Bearing Restrictions Per Provider Order: No      Mobility  Bed Mobility Overal bed mobility: Modified Independent                  Transfers Overall transfer level: Needs assistance Equipment used: Rolling walker (2 wheels) Transfers: Sit to/from Stand, Bed to chair/wheelchair/BSC Sit to Stand: Contact guard assist                Ambulation/Gait Ambulation/Gait assistance: Contact guard assist Gait Distance (Feet): 60 Feet Assistive device: Rolling walker (2 wheels)         General Gait Details: Mild dizziness with initial transition to standing, improves with accommodation to position.  Slightly impulsive with brisk cadence; cuing for slower, paced activity to manage SOB.  MOD BORG 5-6/10 with gait efforts; sats 94-95% on RA, HR 115-120  Stairs            Wheelchair Mobility     Tilt Bed    Modified Rankin (Stroke Patients Only)       Balance Overall balance assessment: Needs assistance Sitting-balance support: No upper extremity supported, Feet supported Sitting balance-Leahy Scale: Normal     Standing balance support: Bilateral upper extremity supported Standing balance-Leahy Scale: Good                               Pertinent Vitals/Pain Pain Assessment Pain Assessment: No/denies pain    Home Living Family/patient expects to be discharged to:: Private residence Living Arrangements: Alone Available Help at Discharge:  (niece checks in/supports regularly) Type of Home: House Home Access: Level entry  Home Layout: One level Home Equipment: Rollator (4 wheels)      Prior Function               Mobility Comments: Mod I with 4 wheeled walker for limited household distances, limited mobility outside of the home; home O2 at night (intermittently); denies fall history.       Extremity/Trunk Assessment   Upper Extremity  Assessment Upper Extremity Assessment: Overall WFL for tasks assessed    Lower Extremity Assessment Lower Extremity Assessment: Overall WFL for tasks assessed       Communication   Communication Communication: No apparent difficulties Factors Affecting Communication: Hearing impaired    Cognition Arousal: Alert Behavior During Therapy: WFL for tasks assessed/performed   PT - Cognitive impairments: No apparent impairments                       PT - Cognition Comments: eager for upcoming discharge Following commands: Intact       Cueing Cueing Techniques: Verbal cues     General Comments      Exercises Other Exercises Other Exercises: Educated on activity pacing, energy conservation and household modifications to help manage SOB; reviewed importance of intermittent rest periods and awareness of signs/symptoms of fatigue.  Patient voiced understanding of all information.   Assessment/Plan    PT Assessment Patient needs continued PT services  PT Problem List Decreased activity tolerance;Decreased balance;Decreased mobility;Decreased knowledge of use of DME;Decreased safety awareness;Decreased knowledge of precautions;Cardiopulmonary status limiting activity       PT Treatment Interventions Gait training;Functional mobility training;Therapeutic activities;Therapeutic exercise;Patient/family education    PT Goals (Current goals can be found in the Care Plan section)  Acute Rehab PT Goals Patient Stated Goal: to return home PT Goal Formulation: With patient Time For Goal Achievement: 09/15/23 Potential to Achieve Goals: Good    Frequency Min 2X/week     Co-evaluation               AM-PAC PT "6 Clicks" Mobility  Outcome Measure Help needed turning from your back to your side while in a flat bed without using bedrails?: None Help needed moving from lying on your back to sitting on the side of a flat bed without using bedrails?: None Help needed  moving to and from a bed to a chair (including a wheelchair)?: A Little Help needed standing up from a chair using your arms (e.g., wheelchair or bedside chair)?: A Little Help needed to walk in hospital room?: A Little Help needed climbing 3-5 steps with a railing? : A Little 6 Click Score: 20    End of Session Equipment Utilized During Treatment: Gait belt Activity Tolerance: Patient tolerated treatment well Patient left: in bed;with call bell/phone within reach;with bed alarm set Nurse Communication: Mobility status PT Visit Diagnosis: Muscle weakness (generalized) (M62.81);Difficulty in walking, not elsewhere classified (R26.2)    Time: 1025-1040 PT Time Calculation (min) (ACUTE ONLY): 15 min   Charges:   PT Evaluation $PT Eval Moderate Complexity: 1 Mod   PT General Charges $$ ACUTE PT VISIT: 1 Visit         Telina Kleckley H. Bevin Bucks, PT, DPT, NCS 09/01/23, 2:04 PM [phone]

## 2023-09-01 NOTE — TOC Transition Note (Signed)
 Transition of Care [facility]) - Discharge Note   Patient Details  Name: Candace Tapia MRN: [id number] Date of Birth: 07-23-1935  Transition of Care [facility]) CM/SW Contact:  Crayton Docker, RN 09/01/2023, 3:22 PM   Clinical Narrative:     Discharge orders noted for home. CM call to patient's son, [name] regarding home health PT/OT recommendations. Patient's son requested CM call patient's phone regarding home heath PT/OT. CM call placed to patient regarding home health PT/OT recommendations. Patient declined home health recommendations.  Final next level of care: Home/Self Care Barriers to Discharge: No Barriers Identified   Patient Goals and CMS Choice    Home/self care  Discharge Placement     Home/self care           Discharge Plan and Services Additional resources added to the After Visit Summary for     Social Drivers of Health (SDOH) Interventions SDOH Screenings   Food Insecurity: No Food Insecurity (08/31/2023)  Housing: Low Risk  (08/31/2023)  Transportation Needs: No Transportation Needs (08/31/2023)  Utilities: Not At Risk (08/31/2023)  Alcohol Screen: Low Risk  (03/03/2023)  Depression (PHQ2-9): Medium Risk (04/17/2023)  Financial Resource Strain: Low Risk  (03/06/2023)   Received from [facility]  Physical Activity: Inactive (03/03/2023)  Social Connections: Socially Isolated (08/31/2023)  Stress: No Stress Concern Present (03/03/2023)  Tobacco Use: Medium Risk (08/31/2023)  Health Literacy: Adequate Health Literacy (03/03/2023)     Readmission Risk Interventions     No data to display

## 2023-09-01 NOTE — Care Management Obs Status (Signed)
 MEDICARE OBSERVATION STATUS NOTIFICATION   Patient Details  Name: MICHON VLAHAKIS MRN: [id number] Date of Birth: 01/12/1936   Medicare Observation Status Notification Given:       Anise Kerns 09/01/2023, 11:44 AM

## 2023-09-01 NOTE — Consult Note (Signed)
 [facility] Health Psychiatric Consult Initial  Patient Name: .Candace Tapia  MRN: [id number]  DOB: 08-11-1935  Consult Order details:  Orders (From admission, onward)     Start     Ordered   08/31/23 1535  IP CONSULT TO PSYCHIATRY       Ordering Provider: [place], MD  Provider:  (Not yet assigned)  Question Answer Comment  Location [facility]   Reason for Consult? depression      08/31/23 1534             Mode of Visit: In person    Psychiatry Consult Evaluation  Service Date: Sep 01, 2023 LOS:  LOS: 0 days  Chief Complaint Depression  Primary Psychiatric Diagnoses  Adjustment disorder with depressed mood 2.   3.    Assessment  Candace Tapia is a 88 y.o. female admitted: Medicallyfor 08/31/2023  9:27 AM with medical history significant of COPD Gold stage II, breast cancer status post lumpectomy/radiation/chemotherapy, GERD, HTN, presented with worsening of cough and shortness of breath. Psychiatry is consulted to evaluate for depression. On today's assessment patient is noted to be resting in bed, is able to acknowledge her chronic history of depression and currently taking medication which has been helping her.  Her depression is in the context of aging, her friends passing away and one of her son passed away.  She denies active SI/HI/plan and denies hallucinations.  She is willing to participate in the outpatient treatment and continue Lexapro .  She is psychiatrically stable for discharge with outpatient follow-up.    Diagnoses:  Active Hospital problems: Principal Problem:   CAP (community acquired pneumonia) Active Problems:   COPD with acute exacerbation (HCC)   Multifocal pneumonia    Plan   ## Psychiatric Medication Recommendations:  Continue Lexapro  10 mg  ## Medical Decision Making Capacity: Not specifically addressed in this encounter  ## Further Work-up:  --   -- most recent EKG on 08/31/23 had QtC of 475    ##  Disposition:-- There are no psychiatric contraindications to discharge at this time  ## Behavioral / Environmental: -Utilize compassion and acknowledge the patient's experiences while setting clear and realistic expectations for care.    ## Safety and Observation Level:  - Based on my clinical evaluation, I estimate the patient to be at LOW risk of self harm in the current setting. - At this time, we recommend  routine. This decision is based on my review of the chart including patient's history and current presentation, interview of the patient, mental status examination, and consideration of suicide risk including evaluating suicidal ideation, plan, intent, suicidal or self-harm behaviors, risk factors, and protective factors. This judgment is based on our ability to directly address suicide risk, implement suicide prevention strategies, and develop a safety plan while the patient is in the clinical setting. Please contact our team if there is a concern that risk level has changed.  CSSR Risk Category:C-SSRS RISK CATEGORY: Low Risk  Suicide Risk Assessment: Patient has following modifiable risk factors for suicide: lack of access to outpatient mental health resources, which we are addressing by providing resources on discharge. Patient has following non-modifiable or demographic risk factors for suicide: caucasian Patient has the following protective factors against suicide: Supportive family, Cultural, spiritual, or religious beliefs that discourage suicide, Frustration tolerance, and no history of suicide attempts  Thank you for this consult request. Recommendations have been communicated to the primary team.  We will sign off at this  time.   Magdalina Whitehead, MD       History of Present Illness   Candace Tapia is a 88 y.o. female admitted: Medicallyfor 08/31/2023  9:27 AM with medical history significant of COPD Gold stage II, breast cancer status post lumpectomy/radiation/chemotherapy,  GERD, HTN, presented with worsening of cough and shortness of breath. Psychiatry is consulted to evaluate for depression.  Patient reports history of COPD and living by herself.  She talks about aging and knowing that she is going to die naturally sooner than later.  She talks about losing many of her friends and also her son who passed away recently.  She describes herself as a happy-go-lucky person.  She reports that she watches TV, comedy shows and funny movies to keep herself occupied.  She reports one of her niece lives with her and make sure she is taking her medications and make sure she has food.  Her other son lives close by to the house and constantly checks on her.  She reports taking antidepressant but was unable to remember the name and reports that she is doing well with the medication. Psych ROS:  Depression: Denies anhedonia, denies feeling hopeless or worthless, reports fair appetite and good energy/motivation.  Denies active suicidal/homicidal ideation/plan Anxiety: Denies generalized anxiety and panic attacks Mania (lifetime and current): Denies recent or previous episodes of mania/hypomania Psychosis: (lifetime and current): Denies hallucinations, no overt delusions noted, not responding to internal stimuli  Collateral information:  Son [id number] to voicemail generic  ROS   Psychiatric and Social History  Psychiatric History:  Information collected from patient/chart  Prev Dx/Sx: Depression Current Psych Provider: Unable to recall Home Meds (current): Lexapro  Previous Med Trials: Unable to recall Therapy: None reported  Prior Psych Hospitalization: Denies Prior Self Harm: Denies Prior Violence: Denies  Family Psych History: Grandmother maternal grandmother has depression Family Hx suicide: Maternal grandmother  Social History:   Educational Hx: Eighth grade Occupational Hx: On SSI Legal Hx: None reported Living Situation: Living by herself with her niece  helping her out and has a son involved in her life Spiritual Hx: Yes Access to weapons/lethal means: Denies  Substance History Alcohol: Denies  Tobacco: Denies Illicit drugs: Denies Prescription drug abuse: Denies Rehab hx: Denies  Exam Findings  Physical Exam: Reviewed and agree with the physical exam findings conducted by the medical provider Vital Signs:  Temp:  [97.6 F (36.4 C)-98.2 F (36.8 C)] 97.7 F (36.5 C) (05/16 0737) Pulse Rate:  [73-96] 73 (05/16 0956) Resp:  [14-20] 14 (05/16 0737) BP: (104-150)/(49-85) 118/49 (05/16 0956) SpO2:  [93 %-97 %] 93 % (05/16 0956) Blood pressure (!) 118/49, pulse 73, temperature 97.7 F (36.5 C), resp. rate 14, height 5\' 5"  (1.651 m), weight 97.2 kg, SpO2 93%. Body mass index is 35.66 kg/m.    Mental Status Exam: General Appearance: Fairly Groomed  Orientation:  Full (Time, Place, and Person)  Memory:  Immediate;   Fair Recent;   Fair Remote;   Poor  Concentration:  Concentration: Fair and Attention Span: Fair  Recall:  Poor  Attention  Fair  Eye Contact:  Good  Speech:  Clear and Coherent  Language:  Fair  Volume:  Normal  Mood: fine  Affect:  Appropriate  Thought Process:  Coherent  Thought Content:  Logical  Suicidal Thoughts:  No  Homicidal Thoughts:  No  Judgement:  Fair  Insight:  Fair  Psychomotor Activity:  Normal  Akathisia:  No  Fund of Knowledge:  Fair  Assets:  Health and safety inspector Housing Social Support  Cognition:  Impaired,  Mild  ADL's:  Intact  AIMS (if indicated):        Other History   These have been pulled in through the EMR, reviewed, and updated if appropriate.  Family History:  The patient's family history includes Breast cancer (age of onset: 48) in her mother and sister; Diabetes in her father and mother; Kidney disease in her father.  Medical History: Past Medical History:  Diagnosis Date   Allergy    Arthritis    Breast cancer (HCC) 2014   radiation and taking  exemestane    Cancer of left breast (HCC) 10/27/2014   GERD (gastroesophageal reflux disease)    H/O: hysterectomy    History of lumpectomy 2014   LT breast   Spinal stenosis of lumbar region 03/08/2023    Surgical History: Past Surgical History:  Procedure Laterality Date   ABDOMINAL HYSTERECTOMY     BREAST LUMPECTOMY Left 2014   BREAST SURGERY Left 2014   JOINT REPLACEMENT     LYMPH NODE DISSECTION     neck   TOTAL SHOULDER REPLACEMENT Bilateral 2010 and 2013     Medications:   Current Facility-Administered Medications:    acetaminophen  (TYLENOL ) tablet 650 mg, 650 mg, Oral, Q8H PRN, Antoniette Batty T, MD   albuterol  (PROVENTIL ) (2.5 MG/3ML) 0.083% nebulizer solution 2.5 mg, 2.5 mg, Inhalation, Q4H PRN, [place], MD   azithromycin (ZITHROMAX) tablet 500 mg, 500 mg, Oral, Daily, Zhang, Ping T, MD, 500 mg at 09/01/23 4696   benzonatate  (TESSALON ) capsule 100 mg, 100 mg, Oral, TID PRN, Zhang, Ping T, MD   cefTRIAXone  (ROCEPHIN ) 2 g in sodium chloride  0.9 % 100 mL IVPB, 2 g, Intravenous, Q24H, [place], MD, Stopped at 09/01/23 1025   enoxaparin  (LOVENOX ) injection 47.5 mg, 0.5 mg/kg, Subcutaneous, Q24H, Hallaji, Sheema M, RPH, 47.5 mg at 08/31/23 2214   escitalopram  (LEXAPRO ) tablet 10 mg, 10 mg, Oral, Daily, Antoniette Batty T, MD, 10 mg at 09/01/23 2952   fluticasone  (FLONASE ) 50 MCG/ACT nasal spray 1 spray, 1 spray, Each Nare, PRN, Zhang, Ping T, MD   fluticasone  furoate-vilanterol (BREO ELLIPTA) 200-25 MCG/ACT 1 puff, 1 puff, Inhalation, Daily, Zhang, Ping T, MD, 1 puff at 09/01/23 8413   gabapentin  (NEURONTIN ) capsule 400 mg, 400 mg, Oral, BID, Antoniette Batty T, MD, 400 mg at 09/01/23 2440   guaiFENesin (MUCINEX) 12 hr tablet 1,200 mg, 1,200 mg, Oral, BID, Zhang, Ping T, MD, 1,200 mg at 09/01/23 1027   hydrALAZINE (APRESOLINE) injection 5 mg, 5 mg, Intravenous, Q6H PRN, [place], MD   HYDROcodone -acetaminophen  (NORCO/VICODIN) 5-325 MG per tablet 1 tablet, 1 tablet, Oral, BID  PRN, [place], MD   ipratropium-albuterol  (DUONEB) 0.5-2.5 (3) MG/3ML nebulizer solution 3 mL, 3 mL, Nebulization, BID, Zhang, Dekui, MD   ondansetron  (ZOFRAN ) tablet 4 mg, 4 mg, Oral, Q6H PRN **OR** ondansetron  (ZOFRAN ) injection 4 mg, 4 mg, Intravenous, Q6H PRN, Antoniette Batty T, MD   predniSONE  (DELTASONE ) tablet 50 mg, 50 mg, Oral, Q breakfast, Antoniette Batty T, MD, 50 mg at 09/01/23 2536  Allergies: Allergies  Allergen Reactions   Tramadol  Hcl Itching    Per pt it caused me to itch all over   Statins     Not sure what exact med was; just made her feel bad (can't elaborate)   Penicillin G Rash   Penicillins Rash    Has patient had a PCN reaction causing immediate rash, facial/tongue/throat swelling,  SOB or lightheadedness with hypotension: No Has patient had a PCN reaction causing severe rash involving mucus membranes or skin necrosis: No Has patient had a PCN reaction that required hospitalization: No Has patient had a PCN reaction occurring within the last 10 years: No If all of the above answers are "NO", then may proceed with Cephalosporin use.   Sulfa Antibiotics Other (See Comments) and Rash    Other Reaction(s): Not available    Aurelia Blotter, MD

## 2023-09-01 NOTE — Evaluation (Signed)
 Occupational Therapy Evaluation Patient Details Name: Candace Tapia MRN: [id number] DOB: [birth year] Today's Date: 09/01/2023   History of Present Illness   Patient is a 88 year old female with acute onset of recurrent nausea, vomiting and diarrhea, urinalysis positive for UTI. Stool for GI panel was positive for enteropathogenic E. Coli. History of osteoarthritis, GERD, breast cancer status post lumpectomy, radiotherapy and chemotherapy     Clinical Impressions Pt seen for OT evaluation this date. Pt was independent/Mod independent in all ADL and functional mobility, living in a 1 story home with no STE. Pt on 2 liters of O2 at home only used at night, during the day, no supplemental O2 used. Pt reports becoming easily fatigued or out of breath with minimal exertion. Pt currently requires Min assist for LB ADLs due to current functional impairments (See OT Problem List below) including significant stiffness in R knee. Pt educated in energy conservation strategies including pursed lip breathing, activity pacing, home/routines modifications, work simplification, AE/DME, prioritizing of meaningful occupations, and falls prevention. Pt verbalized understanding and would benefit from additional skilled OT services to maximize recall and carryover of learned techniques and facilitate implementation of learned techniques into daily routines.  Vitals monitored during session.      If plan is discharge home, recommend the following:   A little help with walking and/or transfers;A little help with bathing/dressing/bathroom;Assistance with cooking/housework;Assist for transportation     Functional Status Assessment   Patient has had a recent decline in their functional status and demonstrates the ability to make significant improvements in function in a reasonable and predictable amount of time.     Equipment Recommendations   None recommended by OT     Recommendations for Other  Services         Precautions/Restrictions   Precautions Precautions: Fall Restrictions Weight Bearing Restrictions Per Provider Order: No     Mobility Bed Mobility Overal bed mobility: Modified Independent             General bed mobility comments: with HOB elevated    Transfers Overall transfer level: Needs assistance Equipment used: Rolling walker (2 wheels) Transfers: Sit to/from Stand, Bed to chair/wheelchair/BSC Sit to Stand: Contact guard assist     Step pivot transfers: Contact guard assist            Balance Overall balance assessment: Needs assistance Sitting-balance support: Feet supported Sitting balance-Leahy Scale: Good       Standing balance-Leahy Scale: Good                             ADL either performed or assessed with clinical judgement   ADL Overall ADL's : Needs assistance/impaired Eating/Feeding: Set up Eating/Feeding Details (indicate cue type and reason): Set up for opening packets and small container.   Grooming Details (indicate cue type and reason): kindly refused this   Upper Body Bathing Details (indicate cue type and reason): kindly refused this Lower Body Bathing: Minimal assistance Lower Body Bathing Details (indicate cue type and reason): limited to stiffness in RLE   Upper Body Dressing Details (indicate cue type and reason): kindly refused Lower Body Dressing: Minimal assistance Lower Body Dressing Details (indicate cue type and reason): limited to stiffness in RLE Toilet Transfer: Contact guard assist   Toileting- Clothing Manipulation and Hygiene: Supervision/safety   Tub/ Shower Transfer: Engineer, petroleum Details (indicate cue type and reason): Task simlation with bed to chair transfers Functional  mobility during ADLs: Contact guard assist       Vision Baseline Vision/History: 1 Wears glasses Ability to See in Adequate Light: 1 Impaired Patient Visual Report: No  change from baseline       Perception         Praxis         Pertinent Vitals/Pain Pain Assessment Pain Assessment: No/denies pain (No pain reports however reports significant stiffness in R knee)     Extremity/Trunk Assessment             Communication Communication Communication: No apparent difficulties Factors Affecting Communication: Hearing impaired (wears hearing aide with limited improvement)   Cognition Arousal: Alert Behavior During Therapy: WFL for tasks assessed/performed Cognition: No apparent impairments                               Following commands: Intact       Cueing  General Comments   Cueing Techniques: Verbal cues      Exercises Other Exercises Other Exercises: Education provided on Purse Lip Breathing exercises   Shoulder Instructions      Home Living Family/patient expects to be discharged to:: Private residence Living Arrangements: Alone Available Help at Discharge:  (has a friend who will come and assist with ADLs and son lives close who will be available after discharge.) Type of Home: House Home Access: Level entry     Home Layout: One level     Bathroom Shower/Tub: Tub/shower unit (has no current grab bars but can get some. Has a Shower seat in tub/shower that she always uses.)   Bathroom Toilet: Handicapped height (has toilet rails) Bathroom Accessibility: Yes How Accessible:  (Uses Rollator at home) Home Equipment: Rollator (4 wheels)          Prior Functioning/Environment Prior Level of Function : Independent/Modified Independent (reported she typically completes all ADLs with Mod I using DME PRN)             Mobility Comments: Mod I with 4 wheeled walker ADLs Comments: has an aide that cooks cleans and helps with ADLs as needed however patient reports that prior to this most recent hospital stay she was able to complete ADLs with Mod I.    OT Problem List: Decreased strength;Decreased  activity tolerance;Decreased safety awareness   OT Treatment/Interventions: Self-care/ADL training;Therapeutic exercise;Therapeutic activities;Energy conservation;DME and/or AE instruction;Patient/family education;Balance training      OT Goals(Current goals can be found in the care plan section)   Acute Rehab OT Goals Patient Stated Goal: Patient reports she is returning home with family support from son and hired caregiver OT Goal Formulation: With patient Time For Goal Achievement: 09/15/23 Potential to Achieve Goals: Good ADL Goals Pt Will Perform Lower Body Bathing: with min assist Pt Will Perform Lower Body Dressing: with min assist Pt Will Transfer to Toilet: with supervision Additional ADL Goal #1: Demonstrate Purse Lip Breathing techniques during ADLs and other functional tasks without cuing to ensure O2 remains above 90 on RA.   OT Frequency:  Min 1X/week    Co-evaluation              AM-PAC OT "6 Clicks" Daily Activity     Outcome Measure Help from another person eating meals?: None Help from another person taking care of personal grooming?: None Help from another person toileting, which includes using toliet, bedpan, or urinal?: A Little Help from another person bathing (including washing, rinsing, drying)?:  A Little Help from another person to put on and taking off regular upper body clothing?: None Help from another person to put on and taking off regular lower body clothing?: A Little 6 Click Score: 21   End of Session Equipment Utilized During Treatment: Gait belt;Rolling walker (2 wheels)  Activity Tolerance: Patient tolerated treatment well Patient left: in chair;with call bell/phone within reach;with chair alarm set;with nursing/sitter in room (RN present at bedside at end of treatment)  OT Visit Diagnosis: Unsteadiness on feet (R26.81);Muscle weakness (generalized) (M62.81)                Time: 3295-1884 OT Time Calculation (min): 36 min Charges:   OT General Charges $OT Visit: 1 Visit OT Evaluation $OT Eval Low Complexity: 1 Low  Candace Tapia OTR/L   Lucas Rushing 09/01/2023, 10:16 AM

## 2023-09-01 NOTE — Discharge Summary (Signed)
 Physician Discharge Summary   Patient: Candace Tapia MRN: [id number] DOB: July 28, 1935  Admit date:     08/31/2023  Discharge date: 09/01/23  Discharge Physician: Donaciano Frizzle   PCP: Raina Bunting, DO   Recommendations at discharge:   Follow-up with PCP in 1 week.  Discharge Diagnoses: Principal Problem:   CAP (community acquired pneumonia) Active Problems:   COPD with acute exacerbation (HCC)   Multifocal pneumonia  Resolved Problems:   * No resolved hospital problems. *  Hospital Course: Candace Tapia is a 88 y.o. female with medical history significant of COPD Gold stage II, breast cancer status post lumpectomy/radiation/chemotherapy, GERD, HTN, presented with worsening of cough and shortness of breath.  Patient states that most of the cough and mucus production happened in the morning when she woke up.  She also has some baseline COPD, always short of breath, but seem to be slightly worsened recently. Chest x-ray showed interstitial changes, concerning for multifocal pneumonia.  However, procalcitonin level is less than 0.1, no bacterial pneumonia present. She received IV antibiotics, no longer need antibiotics.  She will be treated with 20 mg of prednisone  x 3 days, also started some Flonase . Her BNP was elevated at 488, her prior echocardiogram showed normal ejection fraction in 2019, she probably has chronic diastolic congestive heart failure, currently no overt volume overload.  Will start with 20 mg oral Lasix .  Follow-up with PCP to adjust dose.  Assessment and Plan: COPD exacerbation. Post nasal draining. Multifocal pneumonia likely viral. Treatment as above.  Chronic diastolic congestive heart failure. Negative troponin, will start 20 mg oral Lasix .  Does not seem to have exacerbation congestive heart failure.       Consultants: None Procedures performed: None  Disposition: Home Diet recommendation:  Cardiac diet DISCHARGE  MEDICATION: Allergies as of 09/01/2023       Reactions   Tramadol  Hcl Itching   Per pt it caused me to itch all over   Statins    Not sure what exact med was; just made her feel bad (can't elaborate)   Penicillin G Rash   Penicillins Rash   Has patient had a PCN reaction causing immediate rash, facial/tongue/throat swelling, SOB or lightheadedness with hypotension: No Has patient had a PCN reaction causing severe rash involving mucus membranes or skin necrosis: No Has patient had a PCN reaction that required hospitalization: No Has patient had a PCN reaction occurring within the last 10 years: No If all of the above answers are "NO", then may proceed with Cephalosporin use.   Sulfa Antibiotics Other (See Comments), Rash   Other Reaction(s): Not available        Medication List     STOP taking these medications    dicyclomine  10 MG capsule Commonly known as: BENTYL    magic mouthwash w/lidocaine  Soln   ondansetron  4 MG disintegrating tablet Commonly known as: ZOFRAN -ODT       TAKE these medications    acetaminophen  650 MG CR tablet Commonly known as: TYLENOL  Take 650 mg by mouth every 8 (eight) hours as needed for pain.   AeroChamber Plus inhaler Use as instructed   albuterol  108 (90 Base) MCG/ACT inhaler Commonly known as: VENTOLIN  HFA Inhale 1 puff into the lungs every 6 (six) hours as needed.   amLODipine  5 MG tablet Commonly known as: NORVASC  TAKE 1 TABLET(5 MG) BY MOUTH DAILY   aspirin  81 MG chewable tablet Chew 81 mg by mouth daily as needed.   chlorthalidone  25 MG tablet  Commonly known as: HYGROTON  Take 1 tablet (25 mg total) by mouth daily.   Cholecalciferol 25 MCG (1000 UT) capsule Take 1,000 Units by mouth daily.   escitalopram  10 MG tablet Commonly known as: LEXAPRO  TAKE 1 TABLET(10 MG) BY MOUTH DAILY   fluticasone  50 MCG/ACT nasal spray Commonly known as: FLONASE  Place 1 spray into both nostrils as needed. What changed: Another  medication with the same name was added. Make sure you understand how and when to take each.   fluticasone  50 MCG/ACT nasal spray Commonly known as: FLONASE  Place 1 spray into both nostrils daily for 14 days. What changed: You were already taking a medication with the same name, and this prescription was added. Make sure you understand how and when to take each.   furosemide  20 MG tablet Commonly known as: Lasix  Take 1 tablet (20 mg total) by mouth daily for 14 days.   gabapentin  400 MG capsule Commonly known as: NEURONTIN  TAKE 1 CAPSULE(400 MG) BY MOUTH TWICE DAILY   HYDROcodone -acetaminophen  5-325 MG tablet Commonly known as: NORCO/VICODIN Take 1 tablet by mouth 2 (two) times daily as needed.   losartan  100 MG tablet Commonly known as: COZAAR  Take 1 tablet (100 mg total) by mouth daily.   meloxicam  7.5 MG tablet Commonly known as: MOBIC  Take 7.5 mg by mouth 3 (three) times daily.   Multivitamin Women 50+ Tabs Take 1 tablet by mouth daily.   potassium chloride  10 MEQ CR capsule Commonly known as: MICRO-K  TAKE 1 CAPSULE(10 MEQ) BY MOUTH DAILY. MAY OPEN CAPSULE SPRINKLE ON SOFT FOOD OR. SWALLOW WHOLE   predniSONE  20 MG tablet Commonly known as: DELTASONE  Take 1 tablet (20 mg total) by mouth daily with breakfast for 3 days. What changed:  medication strength how much to take   PROBIOTIC DAILY PO Take by mouth.        Follow-up Information     Raina Bunting, DO Follow up in 1 week(s).   Specialty: Family Medicine Why: as scheduled Contact information: [address] [place] [address] [phone]                Discharge Exam: Filed Weights   08/31/23 0941  Weight: 97.2 kg   General exam: Appears calm and comfortable  Respiratory system: Decreased breathing sounds without crackles. Respiratory effort normal. Cardiovascular system: S1 & S2 heard, RRR. No JVD, murmurs, rubs, gallops or clicks. No pedal edema. Gastrointestinal system:  Abdomen is nondistended, soft and nontender. No organomegaly or masses felt. Normal bowel sounds heard. Central nervous system: Alert and oriented. No focal neurological deficits. Extremities: Symmetric 5 x 5 power. Skin: No rashes, lesions or ulcers Psychiatry: Judgement and insight appear normal. Mood & affect appropriate.    Condition at discharge: good  The results of significant diagnostics from this hospitalization (including imaging, microbiology, ancillary and laboratory) are listed below for reference.   Imaging Studies: DG Chest Portable 1 View Result Date: 08/31/2023 CLINICAL DATA:  Shortness of breath EXAM: PORTABLE CHEST 1 VIEW COMPARISON:  June 17, 2022 FINDINGS: Ill-defined reticulonodular pulmonary infiltrates in the right mid upper lobe distribution and left lower lobe. Correlate clinically to rule out pneumonia pneumonitis. IMPRESSION: Ill-defined reticulonodular pulmonary infiltrates in the right mid upper lobe distribution and left lower lobe. Electronically Signed   By: Fredrich Jefferson M.D.   On: 08/31/2023 09:51    Microbiology: Results for orders placed or performed during the hospital encounter of 08/31/23  Resp panel by RT-PCR (RSV, Flu A&B, Covid) Anterior  Nasal Swab     Status: None   Collection Time: 08/31/23 11:17 AM   Specimen: Anterior Nasal Swab  Result Value Ref Range Status   SARS Coronavirus 2 by RT PCR NEGATIVE NEGATIVE Final    Comment: (NOTE) SARS-CoV-2 target nucleic acids are NOT DETECTED.  The SARS-CoV-2 RNA is generally detectable in upper respiratory specimens during the acute phase of infection. The lowest concentration of SARS-CoV-2 viral copies this assay can detect is 138 copies/mL. A negative result does not preclude SARS-Cov-2 infection and should not be used as the sole basis for treatment or other patient management decisions. A negative result may occur with  improper specimen collection/handling, submission of specimen other than  nasopharyngeal swab, presence of viral mutation(s) within the areas targeted by this assay, and inadequate number of viral copies(<138 copies/mL). A negative result must be combined with clinical observations, patient history, and epidemiological information. The expected result is Negative.  Fact Sheet for Patients:  BloggerCourse.com  Fact Sheet for Healthcare Providers:  SeriousBroker.it  This test is no t yet approved or cleared by the [place]  FDA and  has been authorized for detection and/or diagnosis of SARS-CoV-2 by FDA under an Emergency Use Authorization (EUA). This EUA will remain  in effect (meaning this test can be used) for the duration of the COVID-19 declaration under Section 564(b)(1) of the Act, 21 U.S.C.section 360bbb-3(b)(1), unless the authorization is terminated  or revoked sooner.       Influenza A by PCR NEGATIVE NEGATIVE Final   Influenza B by PCR NEGATIVE NEGATIVE Final    Comment: (NOTE) The Xpert Xpress SARS-CoV-2/FLU/RSV plus assay is intended as an aid in the diagnosis of influenza from Nasopharyngeal swab specimens and should not be used as a sole basis for treatment. Nasal washings and aspirates are unacceptable for Xpert Xpress SARS-CoV-2/FLU/RSV testing.  Fact Sheet for Patients: BloggerCourse.com  Fact Sheet for Healthcare Providers: SeriousBroker.it  This test is not yet approved or cleared by the [place]  FDA and has been authorized for detection and/or diagnosis of SARS-CoV-2 by FDA under an Emergency Use Authorization (EUA). This EUA will remain in effect (meaning this test can be used) for the duration of the COVID-19 declaration under Section 564(b)(1) of the Act, 21 U.S.C. section 360bbb-3(b)(1), unless the authorization is terminated or revoked.     Resp Syncytial Virus by PCR NEGATIVE NEGATIVE Final    Comment:  (NOTE) Fact Sheet for Patients: BloggerCourse.com  Fact Sheet for Healthcare Providers: SeriousBroker.it  This test is not yet approved or cleared by the [place]  FDA and has been authorized for detection and/or diagnosis of SARS-CoV-2 by FDA under an Emergency Use Authorization (EUA). This EUA will remain in effect (meaning this test can be used) for the duration of the COVID-19 declaration under Section 564(b)(1) of the Act, 21 U.S.C. section 360bbb-3(b)(1), unless the authorization is terminated or revoked.  Performed at [facility], [address] [place], [place] [address]     Labs: CBC: Recent Labs  Lab 08/31/23 0951 09/01/23 0555  WBC 10.6* 10.4  NEUTROABS 8.3*  --   HGB 13.8 12.7  HCT 41.8 38.5  MCV 91.5 89.7  PLT 257 308   Basic Metabolic Panel: Recent Labs  Lab 08/31/23 0951 09/01/23 0555  NA 139 140  K 3.5 4.5  CL 102 104  CO2 23 27  GLUCOSE 136* 134*  BUN 19 18  CREATININE 0.92 0.98  CALCIUM 9.2 9.3   Liver Function Tests: Recent Labs  Lab 08/31/23 0951  AST 24  ALT 10  ALKPHOS 60  BILITOT 0.7  PROT 7.0  ALBUMIN 3.5   CBG: No results for input(s): "GLUCAP" in the last 168 hours.  Discharge time spent: greater than 30 minutes.  Signed: Donaciano Frizzle, MD [facility] 09/01/2023

## 2023-09-04 ENCOUNTER — Encounter: Payer: Self-pay | Admitting: Family Medicine

## 2023-09-04 ENCOUNTER — Ambulatory Visit: Payer: Self-pay | Admitting: Family Medicine

## 2023-09-04 ENCOUNTER — Other Ambulatory Visit: Payer: Self-pay | Admitting: Family Medicine

## 2023-09-04 VITALS — BP 122/60 | HR 83 | Ht 65.0 in

## 2023-09-04 DIAGNOSIS — G3184 Mild cognitive impairment, so stated: Secondary | ICD-10-CM

## 2023-09-04 DIAGNOSIS — Z8701 Personal history of pneumonia (recurrent): Secondary | ICD-10-CM

## 2023-09-04 DIAGNOSIS — J432 Centrilobular emphysema: Secondary | ICD-10-CM

## 2023-09-04 DIAGNOSIS — I872 Venous insufficiency (chronic) (peripheral): Secondary | ICD-10-CM | POA: Diagnosis not present

## 2023-09-04 DIAGNOSIS — E876 Hypokalemia: Secondary | ICD-10-CM | POA: Diagnosis not present

## 2023-09-04 DIAGNOSIS — R7303 Prediabetes: Secondary | ICD-10-CM | POA: Diagnosis not present

## 2023-09-04 DIAGNOSIS — I129 Hypertensive chronic kidney disease with stage 1 through stage 4 chronic kidney disease, or unspecified chronic kidney disease: Secondary | ICD-10-CM

## 2023-09-04 DIAGNOSIS — G629 Polyneuropathy, unspecified: Secondary | ICD-10-CM | POA: Diagnosis not present

## 2023-09-04 DIAGNOSIS — G4734 Idiopathic sleep related nonobstructive alveolar hypoventilation: Secondary | ICD-10-CM | POA: Diagnosis not present

## 2023-09-04 DIAGNOSIS — Z0000 Encounter for general adult medical examination without abnormal findings: Secondary | ICD-10-CM

## 2023-09-04 DIAGNOSIS — E782 Mixed hyperlipidemia: Secondary | ICD-10-CM

## 2023-09-04 DIAGNOSIS — R0609 Other forms of dyspnea: Secondary | ICD-10-CM | POA: Diagnosis not present

## 2023-09-04 LAB — POCT GLYCOSYLATED HEMOGLOBIN (HGB A1C): Hemoglobin A1C: 5.9 % — AB (ref 4.0–5.6)

## 2023-09-04 MED ORDER — GABAPENTIN 400 MG PO CAPS
400.0000 mg | ORAL_CAPSULE | Freq: Two times a day (BID) | ORAL | 3 refills | Status: DC
Start: 2023-09-04 — End: 2024-03-12

## 2023-09-04 MED ORDER — POTASSIUM CHLORIDE ER 10 MEQ PO CPCR: ORAL_CAPSULE | ORAL | 3 refills | Status: DC

## 2023-09-04 NOTE — Patient Outreach (Signed)
 Error/ Duplicate

## 2023-09-04 NOTE — Patient Outreach (Signed)
 Candace Tapia was scheduled for outreach today. During chart review noted that she was admitted to [facility] on Aug 31, 2023 and treated for pneumonia. She was discharged today.   Candace Tapia requested to reschedule outreach due to returning home within the last few hours and wanting to rest. Requested that I briefly speak with her niece/caregiver Candace Tapia.  Candace Tapia confirmed that Candace Tapia was doing well and just wanted to rest. Reports Transition of Care call was completed by the PCP clinic since discharge. Denies current concerns regarding patient needs or discharge instructions. Candace Tapia is scheduled for follow up with PCP and Pulmonologist on Sep 04, 2023. Candace Tapia reports that patient's son would be transporting her to these appointments.   Reviewed worsening symptoms and indications for seeking immediate medical attention. Candace Tapia verbalized understanding. Agreed to call with questions if needed prior to rescheduled outreach.   Will complete outreach on Sep 15, 2023   Roxie Cord [facility] RN Care Manager Direct Dial: [phone]  Fax: [phone] Website: [email or web address]

## 2023-09-04 NOTE — Patient Instructions (Addendum)
 Thank you for coming to the office today.  Refilled Gabapentin  and Potassium. Other meds have refills  Please schedule a Follow-up Appointment to: Return if symptoms worsen or fail to improve.  If you have any other questions or concerns, please feel free to call the office or send a message through MyChart. You may also schedule an earlier appointment if necessary.  Additionally, you may be receiving a survey about your experience at our office within a few days to 1 week by e-mail or mail. We value your feedback.  Domingo Friend, DO [facility], [place]

## 2023-09-04 NOTE — Progress Notes (Signed)
 Subjective:    Patient ID: Candace Tapia, female    DOB: 1936-03-25, 88 y.o.   MRN: [id number]  Candace Tapia is a 88 y.o. female presenting on 09/04/2023 for Pre-Diabetes, Osteoarthritis, and Hospitalization Follow-up (Pneumonia)   HPI  Discussed the use of AI scribe software for clinical note transcription with the patient, who gave verbal consent to proceed.  History of Present Illness   Candace Tapia is an 88 year old female with chronic osteoarthritis and COPD who presents for a six-month follow-up after hospitalization for pneumonia. She is accompanied by her son, who is her primary caregiver.   HOSPITAL FOLLOW-UP VISIT  Hospital/Location: [facility] Date of Admission: 08/31/23 Date of Discharge: 09/01/23 Transitions of care telephone call: Not completed  Reason for Admission: Pneumonia  FOLLOW-UP  - Hospital H&P and Discharge Summary have been reviewed - Patient presents today about 3 days after recent hospitalization.   She was recently hospitalized for pneumonia, confirmed by x-rays showing bilateral lung involvement. During her hospital stay, she received IV antibiotics and steroids and was discharged with a course of oral antibiotics, which she completed today. Her breathing has improved since the treatment, and she uses a nebulizer daily, which she finds helpful. No new or worsening symptoms related to her chronic conditions are reported.  She has chronic osteoarthritis, particularly affecting her ankles, leading to swelling, especially when sitting with her feet down for extended periods. She was prescribed furosemide  20 mg daily for swelling, but the swelling has been present for years and is attributed to arthritis.  Her current medications include amlodipine  and chlorthalidone  for blood pressure, gabapentin , and potassium supplements due to previous low readings. She does not take Lexapro , which was previously prescribed for mood and anxiety.   - Today reports  overall has done well after discharge. Symptoms of coughing dyspnea are much improved .  - New medications on discharge: Prednisone , Lasix  20mg  - Changes to current meds on discharge: none  Pre-Diabetes / obesity BMI >35 Her blood sugar levels have been stable, with a recent A1c of 5.9, down from previous readings of 6.1 Doing well Meds: Never on meds Limited diet and exercise, admits difficulty exercising due to back hip pain arthritis Denies hypoglycemia, polyuria, visual changes, numbness or tingling.   CHRONIC HTN / Peripheral Edema / Hypokalemia Improved BP control and edema Current Meds - Losartan  100mg  daily, Chlorthalidone  25mg  daily, Amlodipine  5mg  daily   Reports good compliance, took meds today. Taking Potassium 10mEq daily  Now on furosemide  20mg  daily x 14 days on discharge, some improvement but she has chronic edema mostly   Osteoarthritis, multiple joints Chronic joint pain multiple joints. Prior history of shoulder replacement surgery in past She had been on Hydrocodone  years ago and did well only short term. She takes Tylenol  Ext STr 500mg  x 2 2 to 3 times a day Followed by pain management specialist for management of chronic osteoarthritis.    Recurrent Depression, - in remission Reports she has prior history of some depressed mood but overall has managed to cope with it for long time and currently doing well. Cannot tolerate medication, remains off Lexapro     Centrilobular Emphysema (COPD) Followed by Dr Omie Bickers Pulm On oxygen  at night, nocturnal up to 2L Using inhalers Continue Trelegy She is doing well but often can get short of breath with exertion, worse with mask Has some swelling.    I have reviewed the discharge medication list, and have reconciled the current and discharge medications today.  04/17/2023    1:46 PM 02/24/2023   10:05 AM 08/24/2022    2:40 PM  Depression screen PHQ 2/9  Decreased Interest 2 1 1   Down, Depressed, Hopeless 2 1  1   PHQ - 2 Score 4 2 2   Altered sleeping 1 0 0  Tired, decreased energy 2 2 1   Change in appetite 2 1 1   Feeling bad or failure about yourself  0 0 0  Trouble concentrating 1 0 0  Moving slowly or fidgety/restless 0 1 1  Suicidal thoughts 0 1 0  PHQ-9 Score 10 7 5   Difficult doing work/chores Somewhat difficult Not difficult at all        04/17/2023    1:47 PM 02/24/2023   10:05 AM 08/24/2022    2:40 PM 02/23/2022    2:01 PM  GAD 7 : Generalized Anxiety Score  Nervous, Anxious, on Edge 2 0 0 0  Control/stop worrying 1 0 0 0  Worry too much - different things 0 0 0 0  Trouble relaxing 1 0 0 0  Restless 0 0 0 0  Easily annoyed or irritable 0 0 1 0  Afraid - awful might happen 0 0 0 0  Total GAD 7 Score 4 0 1 0  Anxiety Difficulty Somewhat difficult   Not difficult at all    Social History   Tobacco Use   Smoking status: Former    Current packs/day: 0.00    Average packs/day: 2.0 packs/day for 40.0 years (80.0 ttl pk-yrs)    Types: Cigarettes    Start date: 04/19/1953    Quit date: 04/19/1993    Years since quitting: 30.3   Smokeless tobacco: Never  Substance Use Topics   Alcohol use: No    Alcohol/week: 0.0 standard drinks of alcohol   Drug use: No    Review of Systems  HENT:  Positive for hearing loss.    Per HPI unless specifically indicated above     Objective:     BP 122/60 (BP Location: Right Wrist, Patient Position: Sitting, Cuff Size: Normal)   Pulse 83   Ht 5\' 5"  (1.651 m)   SpO2 97%   BMI 35.66 kg/m   Wt Readings from Last 3 Encounters:  08/31/23 214 lb 4.6 oz (97.2 kg)  06/24/23 214 lb 4.6 oz (97.2 kg)  04/17/23 209 lb (94.8 kg)    Physical Exam Vitals and nursing note reviewed.  Constitutional:      General: She is not in acute distress.    Appearance: Normal appearance. She is well-developed. She is not diaphoretic.     Comments: Well-appearing, comfortable, cooperative  HENT:     Head: Normocephalic and atraumatic.     Comments: Hard of  hearing Eyes:     General:        Right eye: No discharge.        Left eye: No discharge.     Conjunctiva/sclera: Conjunctivae normal.  Cardiovascular:     Rate and Rhythm: Normal rate.  Pulmonary:     Effort: Pulmonary effort is normal.     Comments: Reduced air movement diffusely without abnormal sounds. Increased work of breathing Musculoskeletal:     Right lower leg: Edema present.     Left lower leg: Edema (baseline edema lower extremities L>R) present.     Comments: Wheelchair bound  Skin:    General: Skin is warm and dry.     Findings: No erythema or rash.  Neurological:     Mental  Status: She is alert and oriented to person, place, and time.  Psychiatric:        Mood and Affect: Mood normal.        Behavior: Behavior normal.        Thought Content: Thought content normal.     Comments: Well groomed, good eye contact, normal speech and thoughts     Results for orders placed or performed in visit on 09/04/23  POCT HgB A1C   Collection Time: 09/04/23  3:11 PM  Result Value Ref Range   Hemoglobin A1C 5.9 (A) 4.0 - 5.6 %   HbA1c POC (<> result, manual entry)     HbA1c, POC (prediabetic range)     HbA1c, POC (controlled diabetic range)        Assessment & Plan:   Problem List Items Addressed This Visit     Centrilobular emphysema (HCC)   Chronic venous insufficiency   Hypokalemia   Relevant Medications   potassium chloride  (MICRO-K ) 10 MEQ CR capsule   Peripheral polyneuropathy   Relevant Medications   gabapentin  (NEURONTIN ) 400 MG capsule   Pre-diabetes - Primary   Relevant Orders   POCT HgB A1C (Completed)   Other Visit Diagnoses       History of pneumonia            HFU Pneumonia Recent hospitalization for pneumonia treated with IV antibiotics and steroids. Symptoms improved, no signs of pneumonia today. - Continue nebulizer treatments.  Chronic Obstructive Pulmonary Disease (COPD) COPD with reduced air movement and increased work of breathing.  No acute exacerbation.  Edema Chronic edema likely due to venous insufficiency. Swelling stable. Long-term furosemide  not recommended due to kidney strain. - Complete current furosemide  course, then pause. - Monitor for increased swelling or breathing difficulty.  Chronic osteoarthritis Chronic osteoarthritis with ankle pain and swelling.  Borderline diabetes Borderline diabetes with A1c of 5.9, improved from 6.1. Steroids may elevate blood sugar, but course completed. - Continue monitoring blood sugar levels.  Medication management Gabapentin  and potassium require refills. Other medications up to date. - Refill gabapentin  and potassium with 90 days supply and additional refills. - Ensure all other medications have appropriate refills.        Orders Placed This Encounter  Procedures   POCT HgB A1C    Meds ordered this encounter  Medications   gabapentin  (NEURONTIN ) 400 MG capsule    Sig: Take 1 capsule (400 mg total) by mouth 2 (two) times daily.    Dispense:  180 capsule    Refill:  3    Add refills   potassium chloride  (MICRO-K ) 10 MEQ CR capsule    Sig: TAKE 1 CAPSULE(10 MEQ) BY MOUTH DAILY. MAY OPEN CAPSULE SPRINKLE ON SOFT FOOD OR. SWALLOW WHOLE    Dispense:  90 capsule    Refill:  3    Add future refills    Follow up plan: Return for 6 month fasting lab > 1 week later Annual Physical.  Future labs ordered for 03/06/24  Domingo Friend, DO [facility] 09/04/2023, 3:38 PM

## 2023-09-15 ENCOUNTER — Other Ambulatory Visit: Payer: Self-pay

## 2023-10-03 ENCOUNTER — Telehealth: Payer: Self-pay

## 2023-10-06 ENCOUNTER — Ambulatory Visit: Payer: Medicare Other

## 2023-10-06 DIAGNOSIS — Z0000 Encounter for general adult medical examination without abnormal findings: Secondary | ICD-10-CM

## 2023-10-06 NOTE — Progress Notes (Signed)
 Subjective:   Candace Tapia is a 88 y.o. who presents for a Medicare Wellness preventive visit.  As a reminder, Annual Wellness Visits don't include a physical exam, and some assessments may be limited, especially if this visit is performed virtually. We may recommend an in-person follow-up visit with your provider if needed.  Visit Complete: Virtual I connected with  Candace Tapia on 10/06/23 by a audio enabled telemedicine application and verified that I am speaking with the correct person using two identifiers.  Patient Location: Home  Provider Location: Home Office  I discussed the limitations of evaluation and management by telemedicine. The patient expressed understanding and agreed to proceed.  Vital Signs: Because this visit was a virtual/telehealth visit, some criteria may be missing or patient reported. Any vitals not documented were not able to be obtained and vitals that have been documented are patient reported.  VideoDeclined- This patient declined Librarian, academic. Therefore the visit was completed with audio only.  Persons Participating in Visit: Patient.  AWV Questionnaire: No: Patient Medicare AWV questionnaire was not completed prior to this visit.  Cardiac Risk Factors include: advanced age (>29men, >48 women);hypertension;dyslipidemia;sedentary lifestyle;obesity (BMI >30kg/m2)     Objective:    There were no vitals filed for this visit. There is no height or weight on file to calculate BMI.     10/06/2023    2:09 PM 08/31/2023    3:00 PM 08/31/2023    9:42 AM 06/24/2023    1:39 AM 06/23/2023    7:19 PM 07/29/2022    1:35 PM 06/17/2022   11:14 AM  Advanced Directives  Does Patient Have a Medical Advance Directive? No Yes Yes No Yes No No  Type of Advance Directive  Healthcare Power of [employer] Power of Attorney      Does patient want to make changes to medical advance directive?  No - Patient declined       Copy  of Healthcare Power of Attorney in Chart?  No - copy requested       Would patient like information on creating a medical advance directive? No - Patient declined   No - Patient declined  No - Patient declined No - Patient declined    Current Medications (verified) Outpatient Encounter Medications as of 10/06/2023  Medication Sig   acetaminophen  (TYLENOL ) 650 MG CR tablet Take 650 mg by mouth every 8 (eight) hours as needed for pain.   albuterol  (VENTOLIN  HFA) 108 (90 Base) MCG/ACT inhaler Inhale 1 puff into the lungs every 6 (six) hours as needed.    amLODipine  (NORVASC ) 5 MG tablet TAKE 1 TABLET(5 MG) BY MOUTH DAILY   chlorthalidone  (HYGROTON ) 25 MG tablet Take 1 tablet (25 mg total) by mouth daily.   Cholecalciferol 25 MCG (1000 UT) capsule Take 1,000 Units by mouth daily.   fluticasone  (FLONASE ) 50 MCG/ACT nasal spray Place 1 spray into both nostrils as needed.    furosemide  (LASIX ) 20 MG tablet Take 1 tablet (20 mg total) by mouth daily for 14 days.   gabapentin  (NEURONTIN ) 400 MG capsule Take 1 capsule (400 mg total) by mouth 2 (two) times daily.   HYDROcodone -acetaminophen  (NORCO/VICODIN) 5-325 MG tablet Take 1 tablet by mouth 2 (two) times daily as needed.   losartan  (COZAAR ) 100 MG tablet Take 1 tablet (100 mg total) by mouth daily.   meloxicam  (MOBIC ) 7.5 MG tablet Take 7.5 mg by mouth 3 (three) times daily.   Multiple Vitamins-Minerals (MULTIVITAMIN WOMEN 50+) TABS  Take 1 tablet by mouth daily.    potassium chloride  (MICRO-K ) 10 MEQ CR capsule TAKE 1 CAPSULE(10 MEQ) BY MOUTH DAILY. MAY OPEN CAPSULE SPRINKLE ON SOFT FOOD OR. SWALLOW WHOLE   Probiotic Product (PROBIOTIC DAILY PO) Take by mouth.   Spacer/Aero-Holding Chambers (AEROCHAMBER PLUS) inhaler Use as instructed   aspirin  81 MG chewable tablet Chew 81 mg by mouth daily as needed. (Patient not taking: Reported on 10/06/2023)   fluticasone  (FLONASE ) 50 MCG/ACT nasal spray Place 1 spray into both nostrils daily for 14 days.    [DISCONTINUED] fexofenadine (ALLEGRA ALLERGY) 180 MG tablet Take 180 mg by mouth as needed. Reported on 09/29/2015   No facility-administered encounter medications on file as of 10/06/2023.    Allergies (verified) Tramadol  hcl, Statins, Penicillin g, Penicillins, and Sulfa antibiotics   History: Past Medical History:  Diagnosis Date   Allergy    Arthritis    Breast cancer (HCC) 2014   radiation and taking exemestane    Cancer of left breast (HCC) 10/27/2014   GERD (gastroesophageal reflux disease)    H/O: hysterectomy    History of lumpectomy 2014   LT breast   Spinal stenosis of lumbar region 03/08/2023   Past Surgical History:  Procedure Laterality Date   ABDOMINAL HYSTERECTOMY     BREAST LUMPECTOMY Left 2014   BREAST SURGERY Left 2014   JOINT REPLACEMENT     LYMPH NODE DISSECTION     neck   TOTAL SHOULDER REPLACEMENT Bilateral 2010 and 2013   Family History  Problem Relation Age of Onset   Breast cancer Mother 58   Diabetes Mother    Kidney disease Father    Diabetes Father    Breast cancer Sister 38   Social History   Socioeconomic History   Marital status: Widowed    Spouse name: Not on file   Number of children: 2   Years of education: Not on file   Highest education level: 8th grade  Occupational History   Occupation: Retired  Tobacco Use   Smoking status: Former    Current packs/day: 0.00    Average packs/day: 2.0 packs/day for 40.0 years (80.0 ttl pk-yrs)    Types: Cigarettes    Start date: 04/19/1953    Quit date: 04/19/1993    Years since quitting: 30.4   Smokeless tobacco: Never  Vaping Use   Vaping status: Not on file  Substance and Sexual Activity   Alcohol use: No    Alcohol/week: 0.0 standard drinks of alcohol   Drug use: No   Sexual activity: Not Currently  Other Topics Concern   Not on file  Social History Narrative   Not on file   Social Drivers of Health   Financial Resource Strain: Low Risk  (10/06/2023)   Overall Financial  Resource Strain (CARDIA)    Difficulty of Paying Living Expenses: Not hard at all  Food Insecurity: No Food Insecurity (10/06/2023)   Hunger Vital Sign    Worried About Running Out of Food in the Last Year: Never true    Ran Out of Food in the Last Year: Never true  Transportation Needs: No Transportation Needs (10/06/2023)   PRAPARE - Administrator, Civil Service (Medical): No    Lack of Transportation (Non-Medical): No  Physical Activity: Inactive (10/06/2023)   Exercise Vital Sign    Days of Exercise per Week: 0 days    Minutes of Exercise per Session: 0 min  Stress: No Stress Concern Present (10/06/2023)   [place]  [employer] - Occupational Stress Questionnaire    Feeling of Stress: Not at all  Social Connections: Socially Isolated (10/06/2023)   Social Connection and Isolation Panel    Frequency of Communication with Friends and Family: Never    Frequency of Social Gatherings with Friends and Family: Twice a week    Attends Religious Services: Never    Database administrator or Organizations: No    Attends Banker Meetings: Never    Marital Status: Widowed    Tobacco Counseling Counseling given: Not Answered    Clinical Intake:  Pre-visit preparation completed: Yes  Pain : No/denies pain     BMI - recorded: 35.66 Nutritional Status: BMI > 30  Obese Nutritional Risks: None Diabetes: No  Lab Results  Component Value Date   HGBA1C 5.9 (A) 09/04/2023   HGBA1C 6.1 (H) 02/24/2023   HGBA1C 6.1 (H) 02/16/2022     How often do you need to have someone help you when you read instructions, pamphlets, or other written materials from your doctor or pharmacy?: 1 - Never  Interpreter Needed?: No  Information entered by :: Dellie Fergusson, LPN   Activities of Daily Living    10/06/2023    2:10 PM 08/31/2023    3:00 PM  In your present state of health, do you have any difficulty performing the following activities:  Hearing? 1  1  Vision? 0 0  Difficulty concentrating or making decisions? 1 1  Comment MEMORY IS 'TERRIBLE'   Walking or climbing stairs? 1   Comment GETS SHOB, OA   Dressing or bathing? 0   Doing errands, shopping? 0 1  Preparing Food and eating ? N   Using the Toilet? N   In the past six months, have you accidently leaked urine? N   Do you have problems with loss of bowel control? N   Managing your Medications? Y   Managing your Finances? Y   Housekeeping or managing your Housekeeping? Y     Patient Care Team: Raina Bunting, DO as PCP - General (Family Medicine) Adelaida Holts, MD as Referring Physician (Specialist) Satira Curet, MD (Inactive) as Referring Physician (Hematology and Oncology) Roxie Cord, RN as VBCI Care Management Roxie Cord, RN Pa, [facility] (Optometry)  I have updated your Care Teams any recent Medical Services you may have received from other providers in the past year.     Assessment:   This is a routine wellness examination for Candace Tapia.  Hearing/Vision screen Hearing Screening - Comments:: WEARS AIDS, BOTH EARS  Vision Screening - Comments:: READERS- [facility]   Goals Addressed             This Visit's Progress    Cut out extra servings         Depression Screen     10/06/2023    2:06 PM 04/17/2023    1:46 PM 02/24/2023   10:05 AM 08/24/2022    2:40 PM 07/29/2022    1:33 PM 02/23/2022    2:01 PM 08/31/2021    1:50 PM  PHQ 2/9 Scores  PHQ - 2 Score 2 4 2 2  0 6 2  PHQ- 9 Score 3 10 7 5  0 18 4    Fall Risk     10/06/2023    2:09 PM 03/03/2023    1:13 PM 02/24/2023   10:05 AM 08/24/2022    2:40 PM 07/29/2022    1:36 PM  Fall Risk  Falls in the past year? 1 0 0 0 0  Number falls in past yr: 0 0 0 1 0  Injury with Fall? 0 0 0 0 0  Risk for fall due to : No Fall Risks    No Fall Risks  Follow up Falls evaluation completed    Falls prevention discussed;Falls evaluation completed    MEDICARE RISK AT HOME:   Medicare Risk at Home Any stairs in or around the home?: No If so, are there any without handrails?: No Home free of loose throw rugs in walkways, pet beds, electrical cords, etc?: Yes Adequate lighting in your home to reduce risk of falls?: Yes Life alert?: Yes Use of a cane, walker or w/c?: Yes (WALKER) Grab bars in the bathroom?: Yes Shower chair or bench in shower?: Yes Elevated toilet seat or a handicapped toilet?: No  TIMED UP AND GO:  Was the test performed?  No  Cognitive Function: 6CIT completed        10/06/2023    2:13 PM 07/29/2022    1:41 PM 07/27/2021    3:28 PM 07/21/2020    3:29 PM 01/01/2019    3:39 PM  6CIT Screen  What Year? 0 points 0 points 0 points 0 points 0 points  What month? 0 points 0 points 0 points 0 points 0 points  What time? 0 points 0 points 0 points 0 points 0 points  Count back from 20 0 points 0 points 0 points 0 points 0 points  Months in reverse 2 points 2 points 2 points 0 points 0 points  Repeat phrase 2 points 4 points 4 points 8 points 0 points  Total Score 4 points 6 points 6 points 8 points 0 points    Immunizations Immunization History  Administered Date(s) Administered   Fluad Quad(high Dose 65+) 01/01/2019, 03/02/2020, 02/23/2021, 02/23/2022   Fluad Trivalent(High Dose 65+) 03/07/2023   Influenza, High Dose Seasonal PF 02/24/2015, 02/09/2016, 12/12/2016, 01/23/2018   Influenza-Unspecified 01/16/2014   Pneumococcal Conjugate-13 08/29/2014   Pneumococcal Polysaccharide-23 04/19/2007   Tdap 01/17/2013   Zoster, Live 08/29/2014    Screening Tests Health Maintenance  Topic Date Due   Zoster Vaccines- Shingrix (1 of 2) 07/21/1987   COVID-19 Vaccine (1 - 2024-25 season) Never done   DTaP/Tdap/Td (2 - Td or Tdap) 01/18/2023   INFLUENZA VACCINE  11/17/2023   Medicare Annual Wellness (AWV)  10/05/2024   Pneumococcal Vaccine: 50+ Years  Completed   DEXA SCAN  Completed   HPV VACCINES  Aged Out   Meningococcal B Vaccine  Aged  Out    Health Maintenance  Health Maintenance Due  Topic Date Due   Zoster Vaccines- Shingrix (1 of 2) 07/21/1987   COVID-19 Vaccine (1 - 2024-25 season) Never done   DTaP/Tdap/Td (2 - Td or Tdap) 01/18/2023   Health Maintenance Items Addressed: AGED OUT OF MAMMOGRAM, COLONOSCOPY & BONE DENSITY; NEEDS PNA, SHINGRIX, TDAP  Additional Screening:  Vision Screening: Recommended annual ophthalmology exams for early detection of glaucoma and other disorders of the eye. Would you like a referral to an eye doctor? No    Dental Screening: Recommended annual dental exams for proper oral hygiene  Community Resource Referral / Chronic Care Management: CRR required this visit?  No   CCM required this visit?  No   Plan:    I have personally reviewed and noted the following in the patient's chart:   Medical and social history Use of alcohol, tobacco or illicit drugs  Current  medications and supplements including opioid prescriptions. Patient is currently taking opioid prescriptions. Information provided to patient regarding non-opioid alternatives. Patient advised to discuss non-opioid treatment plan with their provider. Functional ability and status Nutritional status Physical activity Advanced directives List of other physicians Hospitalizations, surgeries, and ER visits in previous 12 months Vitals Screenings to include cognitive, depression, and falls Referrals and appointments  In addition, I have reviewed and discussed with patient certain preventive protocols, quality metrics, and best practice recommendations. A written personalized care plan for preventive services as well as general preventive health recommendations were provided to patient.   Candace Bright, LPN   9/56/2130   After Visit Summary: (MyChart) Due to this being a telephonic visit, the after visit summary with patients personalized plan was offered to patient via MyChart   Notes: Nothing significant to  report at this time.

## 2023-10-06 NOTE — Patient Instructions (Addendum)
 Ms. Popwell , Thank you for taking time out of your busy schedule to complete your Annual Wellness Visit with me. I enjoyed our conversation and look forward to speaking with you again next year. I, as well as your care team,  appreciate your ongoing commitment to your health goals. Please review the following plan we discussed and let me know if I can assist you in the future.   Follow up Visits: Next Medicare AWV with our clinical staff:   10/11/24 @ 1:20 PM BY PHONE Have you seen your provider in the last 6 months (3 months if uncontrolled diabetes)? Yes   Clinician Recommendations:  Aim for 30 minutes of exercise or brisk walking, 6-8 glasses of water, and 5 servings of fruits and vegetables each day. TAKE CARE!      This is a list of the screening recommended for you and due dates:  Health Maintenance  Topic Date Due   Zoster (Shingles) Vaccine (1 of 2) 07/21/1987   COVID-19 Vaccine (1 - 2024-25 season) Never done   DTaP/Tdap/Td vaccine (2 - Td or Tdap) 01/18/2023   Flu Shot  11/17/2023   Medicare Annual Wellness Visit  10/05/2024   Pneumococcal Vaccine for age over 25  Completed   DEXA scan (bone density measurement)  Completed   HPV Vaccine  Aged Out   Meningitis B Vaccine  Aged Out    Advanced directives: (ACP Link)Information on Advanced Care Planning can be found at [employer] Advance Health Care Directives Advance Health Care Directives. [email or web address]  Advance Care Planning is important because it:  [x]  Makes sure you receive the medical care that is consistent with your values, goals, and preferences  [x]  It provides guidance to your family and loved ones and reduces their decisional burden about whether or not they are making the right decisions based on your wishes.  Follow the link provided in your after visit summary or read over the paperwork we have mailed to you to help you started getting your Advance Directives in place. If you need assistance in  completing these, please reach out to us  so that we can help you!

## 2023-10-07 ENCOUNTER — Other Ambulatory Visit: Payer: Self-pay | Admitting: Family Medicine

## 2023-10-07 DIAGNOSIS — F3342 Major depressive disorder, recurrent, in full remission: Secondary | ICD-10-CM

## 2023-10-07 DIAGNOSIS — F419 Anxiety disorder, unspecified: Secondary | ICD-10-CM

## 2023-10-10 NOTE — Telephone Encounter (Signed)
 Requested Prescriptions  Pending Prescriptions Disp Refills   escitalopram  (LEXAPRO ) 10 MG tablet [Pharmacy Med Name: ESCITALOPRAM  10MG  TABLETS] 90 tablet 0    Sig: TAKE 1 TABLET(10 MG) BY MOUTH DAILY     Psychiatry:  Antidepressants - SSRI Passed - 10/10/2023 12:04 PM      Passed - Completed PHQ-2 or PHQ-9 in the last 360 days      Passed - Valid encounter within last 6 months    Recent Outpatient Visits           1 month ago Pre-diabetes   [place] [facility] [place], Marsa PARAS, DO   2 months ago Acute cystitis without hematuria   [place] [facility] [place] Marsa PARAS, DO   3 months ago Influenza A   [place] [facility] [place], [place] PARAS, [place]

## 2023-10-26 DIAGNOSIS — G8929 Other chronic pain: Secondary | ICD-10-CM | POA: Diagnosis not present

## 2023-10-26 DIAGNOSIS — M48061 Spinal stenosis, lumbar region without neurogenic claudication: Secondary | ICD-10-CM | POA: Diagnosis not present

## 2023-10-26 DIAGNOSIS — M47896 Other spondylosis, lumbar region: Secondary | ICD-10-CM | POA: Diagnosis not present

## 2023-10-26 DIAGNOSIS — Z79891 Long term (current) use of opiate analgesic: Secondary | ICD-10-CM | POA: Diagnosis not present

## 2023-11-15 ENCOUNTER — Other Ambulatory Visit: Payer: Self-pay

## 2023-11-15 ENCOUNTER — Emergency Department
Admission: EM | Admit: 2023-11-15 | Discharge: 2023-11-15 | Disposition: A | Discharge status: Home / Self Care | Attending: Emergency Medicine | Admitting: Emergency Medicine

## 2023-11-15 ENCOUNTER — Emergency Department

## 2023-11-15 DIAGNOSIS — J441 Chronic obstructive pulmonary disease with (acute) exacerbation: Secondary | ICD-10-CM | POA: Insufficient documentation

## 2023-11-15 DIAGNOSIS — I10 Essential (primary) hypertension: Secondary | ICD-10-CM | POA: Diagnosis not present

## 2023-11-15 DIAGNOSIS — F419 Anxiety disorder, unspecified: Secondary | ICD-10-CM | POA: Insufficient documentation

## 2023-11-15 DIAGNOSIS — R918 Other nonspecific abnormal finding of lung field: Secondary | ICD-10-CM | POA: Diagnosis not present

## 2023-11-15 DIAGNOSIS — F430 Acute stress reaction: Secondary | ICD-10-CM | POA: Diagnosis not present

## 2023-11-15 DIAGNOSIS — J181 Lobar pneumonia, unspecified organism: Secondary | ICD-10-CM | POA: Insufficient documentation

## 2023-11-15 DIAGNOSIS — J189 Pneumonia, unspecified organism: Secondary | ICD-10-CM

## 2023-11-15 DIAGNOSIS — R0602 Shortness of breath: Secondary | ICD-10-CM | POA: Diagnosis not present

## 2023-11-15 LAB — COMPREHENSIVE METABOLIC PANEL WITH GFR
ALT: 13 U/L (ref 0–44)
AST: 20 U/L (ref 15–41)
Albumin: 3.7 g/dL (ref 3.5–5.0)
Alkaline Phosphatase: 65 U/L (ref 38–126)
Anion gap: 13 (ref 5–15)
BUN: 19 mg/dL (ref 8–23)
CO2: 29 mmol/L (ref 22–32)
Calcium: 9.8 mg/dL (ref 8.9–10.3)
Chloride: 100 mmol/L (ref 98–111)
Creatinine, Ser: 0.98 mg/dL (ref 0.44–1.00)
GFR, Estimated: 56 mL/min — ABNORMAL LOW (ref >60)
Glucose, Bld: 124 mg/dL — ABNORMAL HIGH (ref 70–99)
Potassium: 3.8 mmol/L (ref 3.5–5.1)
Sodium: 142 mmol/L (ref 135–145)
Total Bilirubin: 0.3 mg/dL (ref 0.0–1.2)
Total Protein: 6.8 g/dL (ref 6.5–8.1)

## 2023-11-15 LAB — CBC
HCT: 41.6 % (ref 36.0–46.0)
Hemoglobin: 13.5 g/dL (ref 12.0–15.0)
MCH: 28.8 pg (ref 26.0–34.0)
MCHC: 32.5 g/dL (ref 30.0–36.0)
MCV: 88.7 fL (ref 80.0–100.0)
Platelets: 282 K/uL (ref 150–400)
RBC: 4.69 MIL/uL (ref 3.87–5.11)
RDW: 13.5 % (ref 11.5–15.5)
WBC: 11.8 K/uL — ABNORMAL HIGH (ref 4.0–10.5)
nRBC: 0 % (ref 0.0–0.2)

## 2023-11-15 LAB — ETHANOL: Alcohol, Ethyl (B): <15 mg/dL (ref <15)

## 2023-11-15 MED ORDER — IPRATROPIUM-ALBUTEROL 0.5-2.5 (3) MG/3ML IN SOLN
6.0000 mL | Freq: Once | RESPIRATORY_TRACT | Status: AC
  Administered 2023-11-15: 6 mL via RESPIRATORY_TRACT
  Filled 2023-11-15: qty 3

## 2023-11-15 MED ORDER — PREDNISONE 50 MG PO TABS: 50.0000 mg | ORAL_TABLET | Freq: Every day | ORAL | 0 refills | Status: AC

## 2023-11-15 MED ORDER — DOXYCYCLINE HYCLATE 100 MG PO TABS: 100.0000 mg | ORAL_TABLET | Freq: Two times a day (BID) | ORAL | 0 refills | Status: AC

## 2023-11-15 MED ORDER — PREDNISONE 20 MG PO TABS
60.0000 mg | ORAL_TABLET | Freq: Once | ORAL | Status: AC
  Administered 2023-11-15: 60 mg via ORAL
  Filled 2023-11-15: qty 3

## 2023-11-15 MED ORDER — DOXYCYCLINE HYCLATE 100 MG PO TABS
100.0000 mg | ORAL_TABLET | Freq: Once | ORAL | Status: AC
  Administered 2023-11-15: 100 mg via ORAL
  Filled 2023-11-15: qty 1

## 2023-11-15 NOTE — ED Provider Notes (Signed)
 [facility] Provider Note    Event Date/Time   First MD Initiated Contact with Patient 11/15/23 1826     (approximate)   History   Anxiety   HPI  Candace Tapia is a 88 y.o. female who presents to the ED for evaluation of Anxiety   I reviewed DC summary from May related to her medical admission associated with pneumonia and COPD exacerbation.  Otherwise history of HTN and diastolic dysfunction  Patient presents to the ED from home via EMS for a panic attack.  She reports that she lives at home alone and I think I just had a panic attack and got worked up but I am fine now.  During triage she made indications that she was considering blowing her brains out.  She laughs at this and says that if she was going to kill myself I would have done a long time ago.She reports feeling better now that sometimes she gets lonely because she lives by herself.  No current thoughts of harming herself, no active plans, no firearms at home, no hallucinations.  She does report having COPD and that her congestion seems somewhat worse than normal.  No increased sputum production, no chest pain.    Physical Exam   Triage Vital Signs: ED Triage Vitals [11/15/23 1806]  Encounter Vitals Group     BP (!) 162/77     Girls Systolic BP Percentile      Girls Diastolic BP Percentile      Boys Systolic BP Percentile      Boys Diastolic BP Percentile      Pulse Rate 84     Resp 18     Temp 97.7 F (36.5 C)     Temp Source Oral     SpO2 98 %     Weight      Height      Head Circumference      Peak Flow      Pain Score 0     Pain Loc      Pain Education      Exclude from Growth Chart     Most recent vital signs: Vitals:   11/15/23 1806  BP: (!) 162/77  Pulse: 84  Resp: 18  Temp: 97.7 F (36.5 C)  SpO2: 98%    General: Awake, no distress.  CV:  Good peripheral perfusion.  Resp:  Normal effort.  Scattered expiratory wheezes and slightly decreased airflow  throughout. Abd:  No distention.  MSK:  No deformity noted.  Neuro:  No focal deficits appreciated. Other:     ED Results / Procedures / Treatments   Labs (all labs ordered are listed, but only abnormal results are displayed) Labs Reviewed  COMPREHENSIVE METABOLIC PANEL WITH GFR - Abnormal; Notable for the following components:      Result Value   Glucose, Bld 124 (*)    GFR, Estimated 56 (*)    All other components within normal limits  CBC - Abnormal; Notable for the following components:   WBC 11.8 (*)    All other components within normal limits  ETHANOL  URINE DRUG SCREEN, QUALITATIVE ([facility] ONLY)    EKG   RADIOLOGY 1 view CXR interpreted by me with retrocardiac rounded opacity concerning for pneumonia  Official radiology report(s): DG Chest Portable 1 View Result Date: 11/15/2023 CLINICAL DATA:  COPD exacerbation EXAM: PORTABLE CHEST - 1 VIEW COMPARISON:  Aug 31, 2023 FINDINGS: Coarsening of the pulmonary interstitium without overt pulmonary  edema. Rounded masslike consolidation in the retrocardiac left lung base. No pneumothorax or pleural effusion. No cardiomegaly. No acute fracture or destructive lesions. Multilevel thoracic osteophytosis. Bilateral glenohumeral joint arthroplasties. Osteopenia. IMPRESSION: Rounded masslike consolidation in the retrocardiac left lung base, which is new, likely pneumonia. A 2 view chest radiograph in 6-12 weeks is recommended to document resolution once treatment is complete. Electronically Signed   By: Rogelia Myers M.D.   On: 11/15/2023 19:16    PROCEDURES and INTERVENTIONS:  .1-3 Lead EKG Interpretation  Performed by: Claudene Rover, MD Authorized by: Claudene Rover, MD     Interpretation: normal     ECG rate:  80   ECG rate assessment: normal     Rhythm: sinus rhythm     Ectopy: none     Conduction: normal     Medications  ipratropium-albuterol  (DUONEB) 0.5-2.5 (3) MG/3ML nebulizer solution 6 mL (6 mLs Nebulization Given  11/15/23 1940)  predniSONE  (DELTASONE ) tablet 60 mg (60 mg Oral Given 11/15/23 1939)  doxycycline  (VIBRA -TABS) tablet 100 mg (100 mg Oral Given 11/15/23 1940)     IMPRESSION / MDM / ASSESSMENT AND PLAN / ED COURSE  I reviewed the triage vital signs and the nursing notes.  Differential diagnosis includes, but is not limited to, acute stress reaction, overdose, substance abuse, ACS, COPD exacerbation, pneumothorax, pneumonia  {Patient presents with symptoms of an acute illness or injury that is potentially life-threatening.  Patient with COPD presents after reported panic attack with resolution of the vague SI.  I talk with her for a while and I do not have any significant concerns about her harming herself or mental health at this point.  We discussed a possible psychiatric evaluation but she reports that she does not think she needs it and I am comfortable with this.  Regarding her congestion and COPD, she is wheezing with signs of a mild exacerbation.  We will start her on a short course of steroids, provide a breathing treatment and a chest x-ray but ultimately suspect she will be suitable for outpatient management.  Clinical Course as of 11/15/23 2020  Wed Nov 15, 2023  1932 Reassessed.  Discussed x-ray results and signs of pneumonia.  She had initially refused COPD treatment, antibiotics and steroids here in the ED.  I go to the bedside and discussed my concerns and my recommendations for medical management.  She is agreeable.  She expressed some discomfort going home as she lives at home by herself.  We discussed an ambulatory trial and reassessment after these medications and we could always admit her to the hospital. [DS]  2016 Ambulation without shortness of breath, hypoxia [DS]  2018 Reassessed.  Son is at the bedside.  Patient reports that she is eager to go home and son is agreeable.  We discussed steroids, antibiotics and return precaution [DS]    Clinical Course User Index [DS]  Claudene Rover, MD     FINAL CLINICAL IMPRESSION(S) / ED DIAGNOSES   Final diagnoses:  COPD exacerbation (HCC)  Acute stress reaction  Anxiety  Community acquired pneumonia of left lower lobe of lung     Rx / DC Orders   ED Discharge Orders          Ordered    doxycycline  (VIBRA -TABS) 100 MG tablet  2 times daily        11/15/23 2020    predniSONE  (DELTASONE ) 50 MG tablet  Daily        11/15/23 2020  Note:  This document was prepared using Dragon voice recognition software and may include unintentional dictation errors.   Claudene Rover, MD 11/15/23 [id number]

## 2023-11-15 NOTE — Discharge Instructions (Signed)
 Doxycycline  antibiotics twice daily for 1 week to treat pneumonia  Prednisone  steroids once daily for 4 more days for COPD  Continue your inhalers at home  Return to the ED with any worsening symptoms

## 2023-11-15 NOTE — ED Notes (Signed)
 Pt left with son. Son given discharge paperwork.

## 2023-11-15 NOTE — ED Notes (Signed)
 VOL

## 2023-11-15 NOTE — ED Notes (Signed)
Pt refusing medications. Provider made aware

## 2023-11-15 NOTE — ED Notes (Signed)
 This RN ambulated pt after breathing treatment and pt's O2 was 98-100% on room air the entire time. ED provider notified.

## 2023-11-15 NOTE — ED Notes (Signed)
 This EDT assisted this pt to the bathroom. Pt needed assistance walking to the bathroom. Once finished, pt was taken to room.

## 2023-11-15 NOTE — ED Notes (Signed)
 Pt given warm blanket. Bed alarm turned on.

## 2023-11-15 NOTE — ED Notes (Signed)
 Pt reporting to ED d/t anxiety attack. Pt found to have COPD exacerbation related to pneumonia. Pt ABCs intact. RR even and unlabored. Pt in NAD. Bed in lowest locked position. Denies needs at this time.

## 2023-11-15 NOTE — ED Triage Notes (Signed)
 Patient states she started feeling anxious and felt short of breath; called her son and he called 911 reporting patient has panic attacks. Patient states If I had a way to blow my brains out, I would. It's awful to be alone and feel terrible all the time.

## 2023-12-13 DIAGNOSIS — H2513 Age-related nuclear cataract, bilateral: Secondary | ICD-10-CM | POA: Diagnosis not present

## 2024-01-04 ENCOUNTER — Other Ambulatory Visit: Payer: Self-pay | Admitting: Family Medicine

## 2024-01-04 DIAGNOSIS — G4734 Idiopathic sleep related nonobstructive alveolar hypoventilation: Secondary | ICD-10-CM | POA: Diagnosis not present

## 2024-01-04 DIAGNOSIS — J449 Chronic obstructive pulmonary disease, unspecified: Secondary | ICD-10-CM | POA: Diagnosis not present

## 2024-01-04 DIAGNOSIS — I129 Hypertensive chronic kidney disease with stage 1 through stage 4 chronic kidney disease, or unspecified chronic kidney disease: Secondary | ICD-10-CM

## 2024-01-04 NOTE — Telephone Encounter (Signed)
 Requested Prescriptions  Pending Prescriptions Disp Refills   amLODipine  (NORVASC ) 5 MG tablet [Pharmacy Med Name: AMLODIPINE  BESYLATE 5MG  TABLETS] 90 tablet 0    Sig: TAKE 1 TABLET(5 MG) BY MOUTH DAILY     Cardiovascular: Calcium Channel Blockers 2 Failed - 01/04/2024  4:52 PM      Failed - Last BP in normal range    BP Readings from Last 1 Encounters:  11/15/23 (!) 162/77         Passed - Last Heart Rate in normal range    Pulse Readings from Last 1 Encounters:  11/15/23 84         Passed - Valid encounter within last 6 months    Recent Outpatient Visits           4 months ago Pre-diabetes   [place] [facility] [place], Marsa PARAS, DO   5 months ago Acute cystitis without hematuria   [place] [facility] [place] Marsa PARAS, DO   6 months ago Influenza A   [place] [facility] [place], [place] PARAS, [place]

## 2024-01-12 ENCOUNTER — Emergency Department
Admission: EM | Admit: 2024-01-12 | Discharge: 2024-01-12 | Discharge status: Against Medical Advice | Attending: Emergency Medicine | Admitting: Emergency Medicine

## 2024-01-12 ENCOUNTER — Emergency Department

## 2024-01-12 ENCOUNTER — Encounter: Payer: Self-pay | Admitting: Emergency Medicine

## 2024-01-12 ENCOUNTER — Other Ambulatory Visit: Payer: Self-pay

## 2024-01-12 DIAGNOSIS — J449 Chronic obstructive pulmonary disease, unspecified: Secondary | ICD-10-CM | POA: Diagnosis not present

## 2024-01-12 DIAGNOSIS — Z5321 Procedure and treatment not carried out due to patient leaving prior to being seen by health care provider: Secondary | ICD-10-CM | POA: Insufficient documentation

## 2024-01-12 DIAGNOSIS — R0602 Shortness of breath: Secondary | ICD-10-CM | POA: Insufficient documentation

## 2024-01-12 DIAGNOSIS — J40 Bronchitis, not specified as acute or chronic: Secondary | ICD-10-CM | POA: Diagnosis not present

## 2024-01-12 LAB — BASIC METABOLIC PANEL WITH GFR
Anion gap: 19 — ABNORMAL HIGH (ref 5–15)
BUN: 17 mg/dL (ref 8–23)
CO2: 22 mmol/L (ref 22–32)
Calcium: 9.3 mg/dL (ref 8.9–10.3)
Chloride: 96 mmol/L — ABNORMAL LOW (ref 98–111)
Creatinine, Ser: 1.02 mg/dL — ABNORMAL HIGH (ref 0.44–1.00)
GFR, Estimated: 54 mL/min — ABNORMAL LOW (ref >60)
Glucose, Bld: 115 mg/dL — ABNORMAL HIGH (ref 70–99)
Potassium: 3.1 mmol/L — ABNORMAL LOW (ref 3.5–5.1)
Sodium: 137 mmol/L (ref 135–145)

## 2024-01-12 LAB — CBC
HCT: 40 % (ref 36.0–46.0)
Hemoglobin: 13.2 g/dL (ref 12.0–15.0)
MCH: 29 pg (ref 26.0–34.0)
MCHC: 33 g/dL (ref 30.0–36.0)
MCV: 87.9 fL (ref 80.0–100.0)
Platelets: 255 K/uL (ref 150–400)
RBC: 4.55 MIL/uL (ref 3.87–5.11)
RDW: 13.9 % (ref 11.5–15.5)
WBC: 8.2 K/uL (ref 4.0–10.5)
nRBC: 0 % (ref 0.0–0.2)

## 2024-01-12 NOTE — ED Triage Notes (Signed)
 Patient to ED via [facility] from home for SOB- states worse today than normal. States she get panicy sometime. Hx COPD. NAD noted at this time. Speaking in fulls sentences.

## 2024-01-18 DIAGNOSIS — M48061 Spinal stenosis, lumbar region without neurogenic claudication: Secondary | ICD-10-CM | POA: Diagnosis not present

## 2024-01-18 DIAGNOSIS — Z79891 Long term (current) use of opiate analgesic: Secondary | ICD-10-CM | POA: Diagnosis not present

## 2024-01-18 DIAGNOSIS — M47896 Other spondylosis, lumbar region: Secondary | ICD-10-CM | POA: Diagnosis not present

## 2024-01-18 DIAGNOSIS — Z79899 Other long term (current) drug therapy: Secondary | ICD-10-CM | POA: Diagnosis not present

## 2024-01-18 DIAGNOSIS — G8929 Other chronic pain: Secondary | ICD-10-CM | POA: Diagnosis not present

## 2024-02-28 ENCOUNTER — Other Ambulatory Visit: Payer: Self-pay | Admitting: Family Medicine

## 2024-02-28 DIAGNOSIS — I129 Hypertensive chronic kidney disease with stage 1 through stage 4 chronic kidney disease, or unspecified chronic kidney disease: Secondary | ICD-10-CM

## 2024-02-29 NOTE — Telephone Encounter (Signed)
 Requested medications are due for refill today.  yes  Requested medications are on the active medications list.  yes  Last refill. 03/07/2023 #90 3 rf  Future visit scheduled.   yes  Notes to clinic.  Labs are expired    Requested Prescriptions  Pending Prescriptions Disp Refills   losartan  (COZAAR ) 100 MG tablet [Pharmacy Med Name: LOSARTAN  100MG  TABLETS] 90 tablet 3    Sig: TAKE 1 TABLET(100 MG) BY MOUTH DAILY     Cardiovascular:  Angiotensin Receptor Blockers Failed - 02/29/2024  4:51 PM      Failed - Cr in normal range and within 180 days    Creat  Date Value Ref Range Status  02/24/2023 0.99 (H) 0.60 - 0.95 mg/dL Final   Creatinine, Ser  Date Value Ref Range Status  01/12/2024 1.02 (H) 0.44 - 1.00 mg/dL Final         Failed - K in normal range and within 180 days    Potassium  Date Value Ref Range Status  01/12/2024 3.1 (L) 3.5 - 5.1 mmol/L Final  04/28/2014 4.1 3.5 - 5.1 mmol/L Final         Failed - Last BP in normal range    BP Readings from Last 1 Encounters:  01/12/24 (!) 153/66         Passed - Patient is not pregnant      Passed - Valid encounter within last 6 months    Recent Outpatient Visits           5 months ago Pre-diabetes   [place] [facility] [place], Marsa PARAS, DO   7 months ago Acute cystitis without hematuria   [place] [facility] [place], Marsa PARAS, DO   8 months ago Influenza A   [place] [facility] [place], Marsa PARAS, DO               chlorthalidone  (HYGROTON ) 25 MG tablet [Pharmacy Med Name: CHLORTHALIDONE  25MG  TABLETS] 90 tablet 3    Sig: TAKE 1 TABLET(25 MG) BY MOUTH DAILY     Cardiovascular: Diuretics - Thiazide Failed - 02/29/2024  4:51 PM      Failed - Cr in normal range and within 180 days    Creat  Date Value Ref Range Status  02/24/2023 0.99 (H) 0.60 - 0.95 mg/dL Final   Creatinine, Ser  Date Value Ref Range Status  01/12/2024 1.02  (H) 0.44 - 1.00 mg/dL Final         Failed - K in normal range and within 180 days    Potassium  Date Value Ref Range Status  01/12/2024 3.1 (L) 3.5 - 5.1 mmol/L Final  04/28/2014 4.1 3.5 - 5.1 mmol/L Final         Failed - Last BP in normal range    BP Readings from Last 1 Encounters:  01/12/24 (!) 153/66         Passed - Na in normal range and within 180 days    Sodium  Date Value Ref Range Status  01/12/2024 137 135 - 145 mmol/L Final  06/12/2015 137 134 - 144 mmol/L Final  04/28/2014 139 136 - 145 mmol/L Final         Passed - Valid encounter within last 6 months    Recent Outpatient Visits           5 months ago Pre-diabetes   [place] [place], Marsa PARAS, DO  7 months ago Acute cystitis without hematuria   [place] [facility] [place], Marsa PARAS, DO   8 months ago Influenza A   [place] [facility] [place], [place] PARAS, [place]

## 2024-03-06 ENCOUNTER — Other Ambulatory Visit

## 2024-03-06 DIAGNOSIS — I129 Hypertensive chronic kidney disease with stage 1 through stage 4 chronic kidney disease, or unspecified chronic kidney disease: Secondary | ICD-10-CM

## 2024-03-06 DIAGNOSIS — R7303 Prediabetes: Secondary | ICD-10-CM

## 2024-03-06 DIAGNOSIS — Z0000 Encounter for general adult medical examination without abnormal findings: Secondary | ICD-10-CM

## 2024-03-06 DIAGNOSIS — E782 Mixed hyperlipidemia: Secondary | ICD-10-CM

## 2024-03-06 DIAGNOSIS — G3184 Mild cognitive impairment, so stated: Secondary | ICD-10-CM

## 2024-03-07 LAB — HEMOGLOBIN A1C
Hgb A1c MFr Bld: 5.9 % — ABNORMAL HIGH (ref <5.7)
Mean Plasma Glucose: 123 mg/dL
eAG (mmol/L): 6.8 mmol/L

## 2024-03-07 LAB — CBC WITH DIFFERENTIAL/PLATELET
Absolute Lymphocytes: 1255 {cells}/uL (ref 850–3900)
Absolute Monocytes: 607 {cells}/uL (ref 200–950)
Basophils Absolute: 41 {cells}/uL (ref 0–200)
Basophils Relative: 0.5 %
Eosinophils Absolute: 189 {cells}/uL (ref 15–500)
Eosinophils Relative: 2.3 %
HCT: 41.4 % (ref 35.0–45.0)
Hemoglobin: 13.3 g/dL (ref 11.7–15.5)
MCH: 29.3 pg (ref 27.0–33.0)
MCHC: 32.1 g/dL (ref 32.0–36.0)
MCV: 91.2 fL (ref 80.0–100.0)
MPV: 10.8 fL (ref 7.5–12.5)
Monocytes Relative: 7.4 %
Neutro Abs: 6109 {cells}/uL (ref 1500–7800)
Neutrophils Relative %: 74.5 %
Platelets: 262 Thousand/uL (ref 140–400)
RBC: 4.54 Million/uL (ref 3.80–5.10)
RDW: 13.1 % (ref 11.0–15.0)
Total Lymphocyte: 15.3 %
WBC: 8.2 Thousand/uL (ref 3.8–10.8)

## 2024-03-07 LAB — LIPID PANEL
Cholesterol: 196 mg/dL (ref <200)
HDL: 52 mg/dL (ref >=50)
LDL Cholesterol (Calc): 112 mg/dL — ABNORMAL HIGH
Non-HDL Cholesterol (Calc): 144 mg/dL — ABNORMAL HIGH (ref <130)
Total CHOL/HDL Ratio: 3.8 (calc) (ref <5.0)
Triglycerides: 196 mg/dL — ABNORMAL HIGH (ref <150)

## 2024-03-07 LAB — TSH: TSH: 3.28 m[IU]/L (ref 0.40–4.50)

## 2024-03-07 LAB — COMPREHENSIVE METABOLIC PANEL WITH GFR
AG Ratio: 2 (calc) (ref 1.0–2.5)
ALT: 12 U/L (ref 6–29)
AST: 12 U/L (ref 10–35)
Albumin: 4.1 g/dL (ref 3.6–5.1)
Alkaline phosphatase (APISO): 70 U/L (ref 37–153)
BUN: 16 mg/dL (ref 7–25)
CO2: 27 mmol/L (ref 20–32)
Calcium: 9.5 mg/dL (ref 8.6–10.4)
Chloride: 99 mmol/L (ref 98–110)
Creat: 0.94 mg/dL (ref 0.60–0.95)
Globulin: 2.1 g/dL (ref 1.9–3.7)
Glucose, Bld: 104 mg/dL — ABNORMAL HIGH (ref 65–99)
Potassium: 3.4 mmol/L — ABNORMAL LOW (ref 3.5–5.3)
Sodium: 139 mmol/L (ref 135–146)
Total Bilirubin: 0.3 mg/dL (ref 0.2–1.2)
Total Protein: 6.2 g/dL (ref 6.1–8.1)
eGFR: 59 mL/min/1.73m2 — ABNORMAL LOW (ref >=60)

## 2024-03-12 ENCOUNTER — Ambulatory Visit: Admitting: Family Medicine

## 2024-03-12 ENCOUNTER — Encounter: Payer: Self-pay | Admitting: Family Medicine

## 2024-03-12 ENCOUNTER — Other Ambulatory Visit: Payer: Self-pay

## 2024-03-12 ENCOUNTER — Encounter: Payer: Self-pay | Admitting: Hematology and Oncology

## 2024-03-12 VITALS — BP 110/68 | HR 87 | Ht 65.0 in | Wt 207.0 lb

## 2024-03-12 DIAGNOSIS — E876 Hypokalemia: Secondary | ICD-10-CM

## 2024-03-12 DIAGNOSIS — G629 Polyneuropathy, unspecified: Secondary | ICD-10-CM

## 2024-03-12 DIAGNOSIS — F3342 Major depressive disorder, recurrent, in full remission: Secondary | ICD-10-CM

## 2024-03-12 DIAGNOSIS — Z23 Encounter for immunization: Secondary | ICD-10-CM | POA: Diagnosis not present

## 2024-03-12 DIAGNOSIS — N1831 Chronic kidney disease, stage 3a: Secondary | ICD-10-CM

## 2024-03-12 DIAGNOSIS — Z0000 Encounter for general adult medical examination without abnormal findings: Secondary | ICD-10-CM

## 2024-03-12 DIAGNOSIS — G720 Drug-induced myopathy: Secondary | ICD-10-CM

## 2024-03-12 DIAGNOSIS — I129 Hypertensive chronic kidney disease with stage 1 through stage 4 chronic kidney disease, or unspecified chronic kidney disease: Secondary | ICD-10-CM

## 2024-03-12 MED ORDER — GABAPENTIN 400 MG PO CAPS
400.0000 mg | ORAL_CAPSULE | Freq: Two times a day (BID) | ORAL | 3 refills | Status: AC
  Filled 2024-03-12: qty 180, 90d supply, fill #0

## 2024-03-12 MED ORDER — LOSARTAN POTASSIUM 100 MG PO TABS
100.0000 mg | ORAL_TABLET | Freq: Every day | ORAL | 3 refills | Status: AC
  Filled 2024-03-12: qty 90, 90d supply, fill #0

## 2024-03-12 MED ORDER — AMLODIPINE BESYLATE 5 MG PO TABS
5.0000 mg | ORAL_TABLET | Freq: Every day | ORAL | 3 refills | Status: AC
  Filled 2024-03-12: qty 90, 90d supply, fill #0

## 2024-03-12 MED ORDER — CHLORTHALIDONE 25 MG PO TABS
25.0000 mg | ORAL_TABLET | Freq: Every day | ORAL | 3 refills | Status: AC
  Filled 2024-03-12: qty 90, 90d supply, fill #0

## 2024-03-12 MED ORDER — POTASSIUM CHLORIDE ER 10 MEQ PO CPCR
ORAL_CAPSULE | ORAL | 3 refills | Status: AC
  Filled 2024-03-12: qty 90, 90d supply, fill #0

## 2024-03-12 NOTE — Progress Notes (Signed)
 Subjective:    Patient ID: Candace Tapia, female    DOB: 18-Feb-1936, 88 y.o.   MRN: [id number]  Candace Tapia is a 88 y.o. female presenting on 03/12/2024 for Annual Exam   HPI  Discussed the use of AI scribe software for clinical note transcription with the patient, who gave verbal consent to proceed.  History of Present Illness   Candace Tapia is an 88 year old female who presents for an annual physical exam. She is accompanied by her son.  Nausea Gastrointestinal symptoms - Intermittent nausea without vomiting - Takes probiotics every morning for gastrointestinal symptoms - Uncertain if nausea is related to medications, particularly hydrocodone  - Tried Zofran  AS NEEDED some success, prefers to avoid meds  Chronic Positional Dizziness - Experiences dizziness primarily in the mornings - Dizziness improves as the day progresses - Dizziness may be related to medications or changes in body position  Medication management - Current medications include gabapentin , potassium, losartan , chlorthalidone , amlodipine , hydrocodone , and meloxicam  - Hydrocodone  and meloxicam  are managed by a pain clinic [facility] - Difficulties with medication refills at [facility] pharmacy  Laboratory findings and medication intolerance - Recent blood work shows stable thyroid  function, cholesterol levels, and A1c at 5.9 - Intolerance to cholesterol medications due to side effects - Potassium levels have improved but remain slightly low; takes daily potassium supplement      Pre-Diabetes / obesity BMI >34 Her blood sugar levels have been stable, with a recent A1c of 5.9, down from previous readings of 6.1 Doing well Meds: Never on meds Limited diet and exercise, admits difficulty exercising due to back hip pain arthritis Denies hypoglycemia, polyuria, visual changes, numbness or tingling.   CHRONIC HTN / Peripheral Edema / Hypokalemia Improved BP control and edema Current Meds -  Losartan  100mg  daily, Chlorthalidone  25mg  daily, Amlodipine  5mg  daily   Reports good compliance, took meds today. Taking Potassium 10mEq daily  K 3.4   Osteoarthritis, multiple joints Chronic joint pain multiple joints. Prior history of shoulder replacement surgery in past She had been on Hydrocodone  years ago and did well only short term. She takes Tylenol  Ext STr 500mg  x 2 2 to 3 times a day Followed by pain management specialist for management of chronic osteoarthritis.    Recurrent Depression, - in remission Reports she has prior history of some depressed mood but overall has managed to cope with it for long time and currently doing well. Cannot tolerate medication, remains off Lexapro     Centrilobular Emphysema (COPD) Followed by Dr Theotis LAMY Pulm On oxygen  at night, nocturnal up to 2L Using inhalers Continue Trelegy She is doing well but often can get short of breath with exertion, Has some swelling.    Health Maintenance: Flu Shot and Prevnar 20 today Decline Shingles vaccine     03/12/2024    6:59 PM 10/06/2023    2:06 PM 04/17/2023    1:46 PM  Depression screen PHQ 2/9  Decreased Interest 1 1 2   Down, Depressed, Hopeless 0 1 2  PHQ - 2 Score 1 2 4   Altered sleeping 0 0 1  Tired, decreased energy 2 1 2   Change in appetite 0 0 2  Feeling bad or failure about yourself  0 0 0  Trouble concentrating 0 0 1  Moving slowly or fidgety/restless 0 0 0  Suicidal thoughts 0 0 0  PHQ-9 Score 3 3  10    Difficult doing work/chores Not difficult at all Not difficult at all Somewhat difficult  Data saved with a previous flowsheet row definition       04/17/2023    1:47 PM 02/24/2023   10:05 AM 08/24/2022    2:40 PM 02/23/2022    2:01 PM  GAD 7 : Generalized Anxiety Score  Nervous, Anxious, on Edge 2 0 0 0  Control/stop worrying 1 0 0 0  Worry too much - different things 0 0 0 0  Trouble relaxing 1 0 0 0  Restless 0 0 0 0  Easily annoyed or irritable 0 0 1 0  Afraid  - awful might happen 0 0 0 0  Total GAD 7 Score 4 0 1 0  Anxiety Difficulty Somewhat difficult   Not difficult at all     Past Medical History:  Diagnosis Date   Allergy    Arthritis    Breast cancer (HCC) 2014   radiation and taking exemestane    Cancer of left breast (HCC) 10/27/2014   GERD (gastroesophageal reflux disease)    H/O: hysterectomy    History of lumpectomy 2014   LT breast   Spinal stenosis of lumbar region 03/08/2023   Past Surgical History:  Procedure Laterality Date   ABDOMINAL HYSTERECTOMY     BREAST LUMPECTOMY Left 2014   BREAST SURGERY Left 2014   JOINT REPLACEMENT     LYMPH NODE DISSECTION     neck   TOTAL SHOULDER REPLACEMENT Bilateral 2010 and 2013   Social History   Socioeconomic History   Marital status: Widowed    Spouse name: Not on file   Number of children: 2   Years of education: Not on file   Highest education level: 8th grade  Occupational History   Occupation: Retired  Tobacco Use   Smoking status: Former    Current packs/day: 0.00    Average packs/day: 2.0 packs/day for 40.0 years (80.0 ttl pk-yrs)    Types: Cigarettes    Start date: 04/19/1953    Quit date: 04/19/1993    Years since quitting: 30.9   Smokeless tobacco: Never  Vaping Use   Vaping status: Not on file  Substance and Sexual Activity   Alcohol use: No    Alcohol/week: 0.0 standard drinks of alcohol   Drug use: No   Sexual activity: Not Currently  Other Topics Concern   Not on file  Social History Narrative   Not on file   Social Drivers of Health   Financial Resource Strain: Low Risk  (10/06/2023)   Overall Financial Resource Strain (CARDIA)    Difficulty of Paying Living Expenses: Not hard at all  Food Insecurity: No Food Insecurity (10/06/2023)   Hunger Vital Sign    Worried About Running Out of Food in the Last Year: Never true    Ran Out of Food in the Last Year: Never true  Transportation Needs: No Transportation Needs (10/06/2023)   PRAPARE -  Administrator, Civil Service (Medical): No    Lack of Transportation (Non-Medical): No  Physical Activity: Inactive (10/06/2023)   Exercise Vital Sign    Days of Exercise per Week: 0 days    Minutes of Exercise per Session: 0 min  Stress: No Stress Concern Present (10/06/2023)   [employer] of Occupational Health - Occupational Stress Questionnaire    Feeling of Stress: Not at all  Social Connections: Socially Isolated (10/06/2023)   Social Connection and Isolation Panel    Frequency of Communication with Friends and Family: Never    Frequency of Social Gatherings with Friends  and Family: Twice a week    Attends Religious Services: Never    Active Member of Clubs or Organizations: No    Attends Banker Meetings: Never    Marital Status: Widowed  Intimate Partner Violence: Not At Risk (10/06/2023)   Humiliation, Afraid, Rape, and Kick questionnaire    Fear of Current or Ex-Partner: No    Emotionally Abused: No    Physically Abused: No    Sexually Abused: No   Family History  Problem Relation Age of Onset   Breast cancer Mother 28   Diabetes Mother    Kidney disease Father    Diabetes Father    Breast cancer Sister 39   Current Outpatient Medications on File Prior to Visit  Medication Sig   acetaminophen  (TYLENOL ) 650 MG CR tablet Take 650 mg by mouth every 8 (eight) hours as needed for pain.   albuterol  (VENTOLIN  HFA) 108 (90 Base) MCG/ACT inhaler Inhale 1 puff into the lungs every 6 (six) hours as needed.    Cholecalciferol 25 MCG (1000 UT) capsule Take 1,000 Units by mouth daily.   fluticasone  (FLONASE ) 50 MCG/ACT nasal spray Place 1 spray into both nostrils as needed.    fluticasone  (FLONASE ) 50 MCG/ACT nasal spray Place 1 spray into both nostrils daily for 14 days.   HYDROcodone -acetaminophen  (NORCO/VICODIN) 5-325 MG tablet Take 1 tablet by mouth 2 (two) times daily as needed.   meloxicam  (MOBIC ) 7.5 MG tablet Take 7.5 mg by mouth 3 (three) times  daily.   Multiple Vitamins-Minerals (MULTIVITAMIN WOMEN 50+) TABS Take 1 tablet by mouth daily.    Probiotic Product (PROBIOTIC DAILY PO) Take by mouth.   Spacer/Aero-Holding Chambers (AEROCHAMBER PLUS) inhaler Use as instructed   aspirin  81 MG chewable tablet Chew 81 mg by mouth daily as needed. (Patient not taking: Reported on 03/12/2024)   [DISCONTINUED] fexofenadine (ALLEGRA ALLERGY) 180 MG tablet Take 180 mg by mouth as needed. Reported on 09/29/2015   No current facility-administered medications on file prior to visit.    Review of Systems  Constitutional:  Negative for activity change, appetite change, chills, diaphoresis, fatigue and fever.  HENT:  Positive for hearing loss. Negative for congestion.   Eyes:  Negative for visual disturbance.  Respiratory:  Negative for cough, chest tightness, shortness of breath and wheezing.   Cardiovascular:  Negative for chest pain, palpitations and leg swelling.  Gastrointestinal:  Positive for nausea. Negative for abdominal pain, constipation, diarrhea and vomiting.  Genitourinary:  Negative for dysuria, frequency and hematuria.  Musculoskeletal:  Negative for arthralgias and neck pain.  Skin:  Negative for rash.  Neurological:  Positive for dizziness. Negative for weakness, light-headedness, numbness and headaches.  Hematological:  Negative for adenopathy.  Psychiatric/Behavioral:  Negative for behavioral problems, dysphoric mood and sleep disturbance.    Per HPI unless specifically indicated above     Objective:    BP 110/68 (BP Location: Left Arm, Patient Position: Sitting, Cuff Size: Large)   Pulse 87   Ht 5' 5 (1.651 m)   Wt 207 lb (93.9 kg)   SpO2 97%   BMI 34.45 kg/m   Wt Readings from Last 3 Encounters:  03/12/24 207 lb (93.9 kg)  01/12/24 200 lb (90.7 kg)  08/31/23 214 lb 4.6 oz (97.2 kg)    Physical Exam Vitals and nursing note reviewed.  Constitutional:      General: She is not in acute distress.    Appearance: She  is well-developed. She is not diaphoretic.  Comments: Well-appearing, comfortable, cooperative  HENT:     Head: Normocephalic and atraumatic.  Eyes:     General:        Right eye: No discharge.        Left eye: No discharge.     Conjunctiva/sclera: Conjunctivae normal.     Pupils: Pupils are equal, round, and reactive to light.  Neck:     Thyroid : No thyromegaly.  Cardiovascular:     Rate and Rhythm: Normal rate and regular rhythm.     Pulses: Normal pulses.     Heart sounds: Normal heart sounds. No murmur heard. Pulmonary:     Effort: Pulmonary effort is normal. No respiratory distress.     Breath sounds: Normal breath sounds. No wheezing or rales.  Abdominal:     General: Bowel sounds are normal. There is no distension.     Palpations: Abdomen is soft. There is no mass.     Tenderness: There is no abdominal tenderness.  Musculoskeletal:        General: No tenderness. Normal range of motion.     Cervical back: Normal range of motion and neck supple.     Right lower leg: Edema present.     Left lower leg: Edema (chronci stable, venous insufficiency, lymphedema) present.     Comments: Upper / Lower Extremities: - Normal muscle tone, strength bilateral upper extremities 5/5, lower extremities 5/5  Lymphadenopathy:     Cervical: No cervical adenopathy.  Skin:    General: Skin is warm and dry.     Findings: No erythema or rash.  Neurological:     Mental Status: She is alert and oriented to person, place, and time.     Comments: Distal sensation intact to light touch all extremities  Psychiatric:        Mood and Affect: Mood normal.        Behavior: Behavior normal.        Thought Content: Thought content normal.     Comments: Well groomed, good eye contact, normal speech and thoughts     Results for orders placed or performed in visit on 03/06/24  Comprehensive metabolic panel with GFR   Collection Time: 03/06/24  9:04 AM  Result Value Ref Range   Glucose, Bld 104 (H)  65 - 99 mg/dL   BUN 16 7 - 25 mg/dL   Creat 9.05 9.39 - 9.04 mg/dL   eGFR 59 (L) > OR = 60 mL/min/1.64m2   BUN/Creatinine Ratio SEE NOTE: 6 - 22 (calc)   Sodium 139 135 - 146 mmol/L   Potassium 3.4 (L) 3.5 - 5.3 mmol/L   Chloride 99 98 - 110 mmol/L   CO2 27 20 - 32 mmol/L   Calcium 9.5 8.6 - 10.4 mg/dL   Total Protein 6.2 6.1 - 8.1 g/dL   Albumin 4.1 3.6 - 5.1 g/dL   Globulin 2.1 1.9 - 3.7 g/dL (calc)   AG Ratio 2.0 1.0 - 2.5 (calc)   Total Bilirubin 0.3 0.2 - 1.2 mg/dL   Alkaline phosphatase (APISO) 70 37 - 153 U/L   AST 12 10 - 35 U/L   ALT 12 6 - 29 U/L  CBC with Differential/Platelet   Collection Time: 03/06/24  9:04 AM  Result Value Ref Range   WBC 8.2 3.8 - 10.8 Thousand/uL   RBC 4.54 3.80 - 5.10 Million/uL   Hemoglobin 13.3 11.7 - 15.5 g/dL   HCT 58.5 64.9 - 54.9 %   MCV 91.2 80.0 - 100.0 fL  MCH 29.3 27.0 - 33.0 pg   MCHC 32.1 32.0 - 36.0 g/dL   RDW 86.8 88.9 - 84.9 %   Platelets 262 140 - 400 Thousand/uL   MPV 10.8 7.5 - 12.5 fL   Neutro Abs 6,109 1,500 - 7,800 cells/uL   Absolute Lymphocytes 1,255 850 - 3,900 cells/uL   Absolute Monocytes 607 200 - 950 cells/uL   Eosinophils Absolute 189 15 - 500 cells/uL   Basophils Absolute 41 0 - 200 cells/uL   Neutrophils Relative % 74.5 %   Total Lymphocyte 15.3 %   Monocytes Relative 7.4 %   Eosinophils Relative 2.3 %   Basophils Relative 0.5 %  Hemoglobin A1c   Collection Time: 03/06/24  9:04 AM  Result Value Ref Range   Hgb A1c MFr Bld 5.9 (H) <5.7 %   Mean Plasma Glucose 123 mg/dL   eAG (mmol/L) 6.8 mmol/L  Lipid panel   Collection Time: 03/06/24  9:04 AM  Result Value Ref Range   Cholesterol 196 <200 mg/dL   HDL 52 > OR = 50 mg/dL   Triglycerides 803 (H) <150 mg/dL   LDL Cholesterol (Calc) 112 (H) mg/dL (calc)   Total CHOL/HDL Ratio 3.8 <5.0 (calc)   Non-HDL Cholesterol (Calc) 144 (H) <130 mg/dL (calc)  TSH   Collection Time: 03/06/24  9:04 AM  Result Value Ref Range   TSH 3.28 0.40 - 4.50 mIU/L       Assessment & Plan:   Problem List Items Addressed This Visit     Benign hypertension with CKD (chronic kidney disease) stage III (HCC)   Relevant Medications   losartan  (COZAAR ) 100 MG tablet   chlorthalidone  (HYGROTON ) 25 MG tablet   amLODipine  (NORVASC ) 5 MG tablet   Drug-induced myopathy   Hypokalemia   Relevant Medications   potassium chloride  (MICRO-K ) 10 MEQ CR capsule   Peripheral polyneuropathy   Relevant Medications   gabapentin  (NEURONTIN ) 400 MG capsule   Other Visit Diagnoses       Annual physical exam    -  Primary     Flu vaccine need       Relevant Orders   Flu vaccine HIGH DOSE PF(Fluzone Trivalent) (Completed)     Need for Streptococcus pneumoniae vaccination       Relevant Orders   Pneumococcal conjugate vaccine 20-valent (Completed)     Benign hypertension with CKD (chronic kidney disease), stage II       Relevant Medications   losartan  (COZAAR ) 100 MG tablet   chlorthalidone  (HYGROTON ) 25 MG tablet   amLODipine  (NORVASC ) 5 MG tablet     Recurrent major depressive disorder, in full remission            Updated Health Maintenance information Reviewed recent lab results with patient Encouraged improvement to lifestyle with diet and exercise Goal of weight loss   Adult Wellness Visit Annual wellness visit conducted. Blood work reviewed: thyroid  levels normal, cholesterol stable, A1c at 5.9 indicating prediabetes, blood counts normal, kidney function good, potassium slightly low but improved. - Continue current medications and lifestyle. - Scheduled follow-up in six months for A1c check without blood work.  Encounter for immunization Received flu shot. Discussed shingles and pneumonia vaccines. Shingles vaccine was opted out by patient and family. Pneumonia vaccine recommended due to recent hospitalizations for pneumonia. - Administered pneumonia vaccine. Prevnar 20 - Flu shot  Hypertensive chronic kidney disease IIIa Blood pressure management  ongoing. Kidney function well-managed. - Continue current antihypertensive regimen.  Hypokalemia K 3.4, Potassium  levels slightly low but improved. Continues potassium supplementation. Secondary to chlorthalidone  - Continue potassium supplementation. 10mEq daily  Prediabetes A1c at 5.9, indicating prediabetes. Condition well-managed over the past three years. - Continue monitoring A1c levels.  Chronic pain syndrome Followed by pain management specialist Managed with hydrocodone  and meloxicam . Issues with pharmacy refills noted. Plan to switch pharmacy to improve medication management. - Switched pharmacy to [facility] community pharmacy for better medication management. - Ensured 90-day supply of medications.  Nausea Intermittent nausea, possibly related to medication side effects. Already tried Zofran  AS NEEDED, prefers to avoid more rx meds Consider vitamin B6 supplementation for nausea.  Chronic Dizziness Positional / Intermittent dizziness, particularly in the morning. Possible causes include medication side effects and inner ear issues. - Continue to monitor symptoms and adjust medications if necessary.     Major Depression in remission Stable mood Not on SSRI any further No new concerns.      Orders Placed This Encounter  Procedures   Flu vaccine HIGH DOSE PF(Fluzone Trivalent)   Pneumococcal conjugate vaccine 20-valent    Meds ordered this encounter  Medications   losartan  (COZAAR ) 100 MG tablet    Sig: Take 1 tablet (100 mg total) by mouth daily.    Dispense:  90 tablet    Refill:  3    Switch pharmacy to [facility], call son Candace Tapia [phone] for refills   potassium chloride  (MICRO-K ) 10 MEQ CR capsule    Sig: TAKE 1 CAPSULE(10 MEQ) BY MOUTH DAILY. MAY OPEN CAPSULE SPRINKLE ON SOFT FOOD OR. SWALLOW WHOLE    Dispense:  90 capsule    Refill:  3    Switch pharmacy to [facility], call son Candace Tapia [phone] for refills   gabapentin  (NEURONTIN ) 400  MG capsule    Sig: Take 1 capsule (400 mg total) by mouth 2 (two) times daily.    Dispense:  180 capsule    Refill:  3    Switch pharmacy to [facility], call son Candace Tapia [phone] for refills   chlorthalidone  (HYGROTON ) 25 MG tablet    Sig: Take 1 tablet (25 mg total) by mouth daily.    Dispense:  90 tablet    Refill:  3    Switch pharmacy to [facility], call son Candace Tapia [phone] for refills   amLODipine  (NORVASC ) 5 MG tablet    Sig: Take 1 tablet (5 mg total) by mouth daily.    Dispense:  90 tablet    Refill:  3    Switch pharmacy to [facility], call son Candace Tapia [phone] for refills     Follow up plan: Return for 6 month PreDM A1c.  Marsa Officer, DO [facility] 03/12/2024, 2:17 PM

## 2024-03-12 NOTE — Patient Instructions (Addendum)
 Thank you for coming to the office today.  Refilled all meds to [facility]. 90 day orders for our main medication orders here  - Amlodipine , Chlorthalidone , Gabapentin , Losartan , Potassium  Vitamin B6 can help reduce nausea  We can re order Zofran  Ondansetron  if need again in future for nausea.  It is likely related to pain medicine  Dizziness can be side effect on medications or position of head and movement related.   Recent Labs    09/04/23 1511 03/06/24 0904  HGBA1C 5.9* 5.9*     Please schedule a Follow-up Appointment to: Return for 6 month PreDM A1c.  If you have any other questions or concerns, please feel free to call the office or send a message through MyChart. You may also schedule an earlier appointment if necessary.  Additionally, you may be receiving a survey about your experience at our office within a few days to 1 week by e-mail or mail. We value your feedback.  Marsa Officer, DO [facility] [place], [place]

## 2024-04-03 ENCOUNTER — Other Ambulatory Visit: Payer: Self-pay | Admitting: Family Medicine

## 2024-04-03 DIAGNOSIS — I129 Hypertensive chronic kidney disease with stage 1 through stage 4 chronic kidney disease, or unspecified chronic kidney disease: Secondary | ICD-10-CM

## 2024-04-05 ENCOUNTER — Other Ambulatory Visit: Payer: Self-pay

## 2024-04-05 DIAGNOSIS — I129 Hypertensive chronic kidney disease with stage 1 through stage 4 chronic kidney disease, or unspecified chronic kidney disease: Secondary | ICD-10-CM

## 2024-04-05 MED ORDER — AMLODIPINE BESYLATE 5 MG PO TABS: 5.0000 mg | ORAL_TABLET | Freq: Every day | ORAL | 3 refills | Status: AC

## 2024-04-05 NOTE — Telephone Encounter (Signed)
 Duplicate request- filled 04/05/24 Requested Prescriptions  Pending Prescriptions Disp Refills   amLODipine  (NORVASC ) 5 MG tablet [Pharmacy Med Name: AMLODIPINE  BESYLATE 5MG  TABLETS] 90 tablet 3    Sig: TAKE 1 TABLET(5 MG) BY MOUTH DAILY     Cardiovascular: Calcium Channel Blockers 2 Passed - 04/05/2024  2:10 PM      Passed - Last BP in normal range    BP Readings from Last 1 Encounters:  03/12/24 110/68         Passed - Last Heart Rate in normal range    Pulse Readings from Last 1 Encounters:  03/12/24 87         Passed - Valid encounter within last 6 months    Recent Outpatient Visits           3 weeks ago Annual physical exam   [place] [facility] [place], Marsa PARAS, DO   7 months ago Pre-diabetes   [facility] [place] Marsa PARAS, DO   8 months ago Acute cystitis without hematuria   [place] [facility] [place] Marsa PARAS, DO   9 months ago Influenza A    [facility] [place], Marsa PARAS, [place]

## 2024-04-24 ENCOUNTER — Other Ambulatory Visit: Payer: Self-pay

## 2024-04-24 MED ORDER — HYDROCODONE-ACETAMINOPHEN 5-325 MG PO TABS
1.0000 | ORAL_TABLET | Freq: Two times a day (BID) | ORAL | 0 refills | Status: AC
  Filled 2024-04-24: qty 56, 28d supply, fill #0

## 2024-04-24 MED ORDER — AMLODIPINE BESYLATE 5 MG PO TABS
5.0000 mg | ORAL_TABLET | Freq: Every day | ORAL | 3 refills | Status: AC
  Filled 2024-04-24 – 2024-05-21 (×3): qty 90, 90d supply, fill #0

## 2024-04-24 MED ORDER — HYDROCODONE-ACETAMINOPHEN 5-325 MG PO TABS
1.0000 | ORAL_TABLET | Freq: Two times a day (BID) | ORAL | 0 refills | Status: AC | PRN
  Filled 2024-05-21: qty 56, 28d supply, fill #0

## 2024-04-24 MED ORDER — MELOXICAM 7.5 MG PO TABS
7.5000 mg | ORAL_TABLET | Freq: Every day | ORAL | 2 refills | Status: AC
  Filled 2024-04-24: qty 60, 60d supply, fill #0

## 2024-04-25 ENCOUNTER — Other Ambulatory Visit: Payer: Self-pay

## 2024-04-25 ENCOUNTER — Other Ambulatory Visit (HOSPITAL_COMMUNITY): Payer: Self-pay

## 2024-04-25 MED ORDER — ALBUTEROL SULFATE HFA 108 (90 BASE) MCG/ACT IN AERS
2.0000 | INHALATION_SPRAY | Freq: Four times a day (QID) | RESPIRATORY_TRACT | 1 refills | Status: AC | PRN
  Filled 2024-04-25: qty 6.7, 25d supply, fill #0

## 2024-05-15 ENCOUNTER — Other Ambulatory Visit: Payer: Self-pay

## 2024-05-21 ENCOUNTER — Other Ambulatory Visit: Payer: Self-pay

## 2024-09-10 ENCOUNTER — Ambulatory Visit: Admitting: Family Medicine

## 2024-10-11 ENCOUNTER — Ambulatory Visit

## 2024-10-16 ENCOUNTER — Ambulatory Visit
